# Patient Record
Sex: Female | Born: 1945
Health system: Southern US, Community
[De-identification: ages and names within clinical notes are randomized; demographics above are authoritative.]

## PROBLEM LIST (undated history)

## (undated) DIAGNOSIS — J45909 Unspecified asthma, uncomplicated: Secondary | ICD-10-CM

## (undated) DIAGNOSIS — E119 Type 2 diabetes mellitus without complications: Secondary | ICD-10-CM

## (undated) DIAGNOSIS — J449 Chronic obstructive pulmonary disease, unspecified: Secondary | ICD-10-CM

## (undated) DIAGNOSIS — G8929 Other chronic pain: Secondary | ICD-10-CM

## (undated) DIAGNOSIS — E785 Hyperlipidemia, unspecified: Secondary | ICD-10-CM

## (undated) DIAGNOSIS — J309 Allergic rhinitis, unspecified: Secondary | ICD-10-CM

## (undated) DIAGNOSIS — F329 Major depressive disorder, single episode, unspecified: Secondary | ICD-10-CM

## (undated) DIAGNOSIS — F3289 Other specified depressive episodes: Secondary | ICD-10-CM

## (undated) DIAGNOSIS — I10 Essential (primary) hypertension: Secondary | ICD-10-CM

## (undated) DIAGNOSIS — J189 Pneumonia, unspecified organism: Secondary | ICD-10-CM

## (undated) DIAGNOSIS — L409 Psoriasis, unspecified: Secondary | ICD-10-CM

## (undated) DIAGNOSIS — K219 Gastro-esophageal reflux disease without esophagitis: Secondary | ICD-10-CM

## (undated) DIAGNOSIS — J42 Unspecified chronic bronchitis: Secondary | ICD-10-CM

## (undated) DIAGNOSIS — M1990 Unspecified osteoarthritis, unspecified site: Secondary | ICD-10-CM

## (undated) DIAGNOSIS — F411 Generalized anxiety disorder: Secondary | ICD-10-CM

## (undated) DIAGNOSIS — H269 Unspecified cataract: Secondary | ICD-10-CM

## (undated) DIAGNOSIS — T7840XA Allergy, unspecified, initial encounter: Secondary | ICD-10-CM

## (undated) HISTORY — DX: Generalized anxiety disorder: F41.1

## (undated) HISTORY — DX: Major depressive disorder, single episode, unspecified: F32.9

## (undated) HISTORY — DX: Gastro-esophageal reflux disease without esophagitis: K21.9

## (undated) HISTORY — DX: Essential (primary) hypertension: I10

## (undated) HISTORY — DX: Other specified depressive episodes: F32.89

## (undated) HISTORY — DX: Pneumonia, unspecified organism: J18.9

## (undated) HISTORY — DX: Unspecified cataract: H26.9

## (undated) HISTORY — PX: TRIGGER FINGER RELEASE: SHX641

## (undated) HISTORY — DX: Unspecified osteoarthritis, unspecified site: M19.90

## (undated) HISTORY — DX: Unspecified asthma, uncomplicated: J45.909

## (undated) HISTORY — DX: Unspecified chronic bronchitis: J42

## (undated) HISTORY — DX: Psoriasis, unspecified: L40.9

## (undated) HISTORY — PX: UPPER GASTROINTESTINAL ENDOSCOPY: SHX188

## (undated) HISTORY — DX: Allergic rhinitis, unspecified: J30.9

## (undated) HISTORY — DX: Chronic obstructive pulmonary disease, unspecified: J44.9

## (undated) HISTORY — DX: Allergy, unspecified, initial encounter: T78.40XA

## (undated) HISTORY — DX: Type 2 diabetes mellitus without complications: E11.9

## (undated) HISTORY — DX: Hyperlipidemia, unspecified: E78.5

## (undated) HISTORY — PX: COLONOSCOPY: SHX174

## (undated) HISTORY — PX: CARPAL TUNNEL RELEASE: SHX101

---
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
## 1978-10-28 HISTORY — PX: ABDOMINAL HYSTERECTOMY: SHX81

## 2003-10-29 LAB — HM COLONOSCOPY: HM Colonoscopy: NORMAL

## 2009-07-29 ENCOUNTER — Emergency Department (HOSPITAL_BASED_OUTPATIENT_CLINIC_OR_DEPARTMENT_OTHER): Admission: EM | Admit: 2009-07-29 | Discharge: 2009-07-29 | Payer: Self-pay | Admitting: Emergency Medicine

## 2010-03-02 ENCOUNTER — Ambulatory Visit: Payer: Self-pay | Admitting: Internal Medicine

## 2010-03-02 DIAGNOSIS — E1169 Type 2 diabetes mellitus with other specified complication: Secondary | ICD-10-CM | POA: Insufficient documentation

## 2010-03-02 DIAGNOSIS — E1140 Type 2 diabetes mellitus with diabetic neuropathy, unspecified: Secondary | ICD-10-CM | POA: Insufficient documentation

## 2010-03-02 DIAGNOSIS — R058 Other specified cough: Secondary | ICD-10-CM | POA: Insufficient documentation

## 2010-03-02 DIAGNOSIS — R05 Cough: Secondary | ICD-10-CM

## 2010-03-02 DIAGNOSIS — J310 Chronic rhinitis: Secondary | ICD-10-CM | POA: Insufficient documentation

## 2010-03-02 DIAGNOSIS — F418 Other specified anxiety disorders: Secondary | ICD-10-CM | POA: Insufficient documentation

## 2010-03-02 DIAGNOSIS — I10 Essential (primary) hypertension: Secondary | ICD-10-CM | POA: Insufficient documentation

## 2010-03-02 DIAGNOSIS — K219 Gastro-esophageal reflux disease without esophagitis: Secondary | ICD-10-CM | POA: Insufficient documentation

## 2010-03-02 DIAGNOSIS — E785 Hyperlipidemia, unspecified: Secondary | ICD-10-CM

## 2010-03-02 DIAGNOSIS — IMO0002 Reserved for concepts with insufficient information to code with codable children: Secondary | ICD-10-CM | POA: Insufficient documentation

## 2010-03-02 DIAGNOSIS — E1165 Type 2 diabetes mellitus with hyperglycemia: Secondary | ICD-10-CM | POA: Insufficient documentation

## 2010-03-02 LAB — CONVERTED CEMR LAB
ALT: 38 units/L — ABNORMAL HIGH (ref 0–35)
AST: 41 units/L — ABNORMAL HIGH (ref 0–37)
Albumin: 3.7 g/dL (ref 3.5–5.2)
Alkaline Phosphatase: 54 units/L (ref 39–117)
BUN: 14 mg/dL (ref 6–23)
Basophils Absolute: 0 10*3/uL (ref 0.0–0.1)
Basophils Relative: 0.3 % (ref 0.0–3.0)
Bilirubin, Direct: 0 mg/dL (ref 0.0–0.3)
CO2: 28 meq/L (ref 19–32)
Calcium: 9 mg/dL (ref 8.4–10.5)
Chloride: 105 meq/L (ref 96–112)
Cholesterol: 176 mg/dL (ref 0–200)
Creatinine, Ser: 0.7 mg/dL (ref 0.4–1.2)
Creatinine,U: 172.9 mg/dL
Eosinophils Absolute: 0.2 10*3/uL (ref 0.0–0.7)
Eosinophils Relative: 2.2 % (ref 0.0–5.0)
GFR calc non Af Amer: 109.82 mL/min (ref >60)
Glucose, Bld: 103 mg/dL — ABNORMAL HIGH (ref 70–99)
HCT: 35.3 % — ABNORMAL LOW (ref 36.0–46.0)
HDL: 37.4 mg/dL — ABNORMAL LOW (ref >39.00)
Hemoglobin: 11.9 g/dL — ABNORMAL LOW (ref 12.0–15.0)
Hgb A1c MFr Bld: 6.9 % — ABNORMAL HIGH (ref 4.6–6.5)
LDL Cholesterol: 114 mg/dL — ABNORMAL HIGH (ref 0–99)
Lymphocytes Relative: 30.3 % (ref 12.0–46.0)
Lymphs Abs: 2.4 10*3/uL (ref 0.7–4.0)
MCHC: 33.7 g/dL (ref 30.0–36.0)
MCV: 91.6 fL (ref 78.0–100.0)
Microalb Creat Ratio: 0.4 mg/g (ref 0.0–30.0)
Microalb, Ur: 0.7 mg/dL (ref 0.0–1.9)
Monocytes Absolute: 0.7 10*3/uL (ref 0.1–1.0)
Monocytes Relative: 8.9 % (ref 3.0–12.0)
Neutro Abs: 4.6 10*3/uL (ref 1.4–7.7)
Neutrophils Relative %: 58.3 % (ref 43.0–77.0)
Platelets: 245 10*3/uL (ref 150.0–400.0)
Potassium: 3.8 meq/L (ref 3.5–5.1)
RBC: 3.86 M/uL — ABNORMAL LOW (ref 3.87–5.11)
RDW: 13.5 % (ref 11.5–14.6)
Sodium: 142 meq/L (ref 135–145)
TSH: 2.42 microintl units/mL (ref 0.35–5.50)
Total Bilirubin: 0.4 mg/dL (ref 0.3–1.2)
Total CHOL/HDL Ratio: 5
Total Protein: 7.2 g/dL (ref 6.0–8.3)
Triglycerides: 121 mg/dL (ref 0.0–149.0)
VLDL: 24.2 mg/dL (ref 0.0–40.0)
WBC: 7.9 10*3/uL (ref 4.5–10.5)

## 2010-03-02 LAB — HM DIABETES FOOT EXAM

## 2010-04-17 ENCOUNTER — Telehealth: Payer: Self-pay | Admitting: Internal Medicine

## 2010-04-17 ENCOUNTER — Ambulatory Visit: Payer: Self-pay | Admitting: Internal Medicine

## 2010-07-06 ENCOUNTER — Ambulatory Visit: Payer: Self-pay | Admitting: Internal Medicine

## 2010-07-07 LAB — CONVERTED CEMR LAB: Hgb A1c MFr Bld: 7.2 % — ABNORMAL HIGH (ref 4.6–6.5)

## 2010-07-11 ENCOUNTER — Telehealth: Payer: Self-pay | Admitting: Internal Medicine

## 2010-08-28 ENCOUNTER — Telehealth: Payer: Self-pay | Admitting: Internal Medicine

## 2010-09-14 ENCOUNTER — Encounter: Payer: Self-pay | Admitting: Internal Medicine

## 2010-09-14 LAB — HM MAMMOGRAPHY

## 2010-10-12 ENCOUNTER — Ambulatory Visit: Payer: Self-pay | Admitting: Internal Medicine

## 2010-10-12 DIAGNOSIS — F411 Generalized anxiety disorder: Secondary | ICD-10-CM | POA: Insufficient documentation

## 2010-10-15 LAB — CONVERTED CEMR LAB: Hgb A1c MFr Bld: 6.9 % — ABNORMAL HIGH (ref 4.6–6.5)

## 2010-11-16 ENCOUNTER — Ambulatory Visit
Admission: RE | Admit: 2010-11-16 | Discharge: 2010-11-16 | Payer: Self-pay | Source: Home / Self Care | Attending: Internal Medicine | Admitting: Internal Medicine

## 2010-11-16 DIAGNOSIS — R197 Diarrhea, unspecified: Secondary | ICD-10-CM | POA: Insufficient documentation

## 2010-11-27 NOTE — Assessment & Plan Note (Signed)
Summary: 4 mos f/u // #/cd   Vital Signs:  Patient profile:   65 year old female Height:      60 inches (152.40 cm) Weight:      178.12 pounds (80.96 kg) O2 Sat:      98 % on Room air Temp:     98.5 degrees F (36.94 degrees C) oral Pulse rate:   87 / minute BP sitting:   132 / 68  (left arm) Cuff size:   regular  Vitals Entered By: Orlan Leavens RMA (July 06, 2010 9:14 AM)  O2 Flow:  Room air CC: 4 month follow-up Is Patient Diabetic? Yes Did you bring your meter with you today? No Pain Assessment Patient in pain? no      Comments Req refill on hydromet cough syrup. C/o ongoing cough, light headed. Also want flu shot   Primary Care Provider:  Newt Lukes MD  CC:  4 month follow-up.  History of Present Illness: here for followup -  1) HTN - reports compliance with ongoing medical treatment and no changes in medication dose or frequency. denies adverse side effects related to current therapy.   2) dyslipidemia - not compliance with ongoing prescribed medical treatment due leg fatigue no changes in medication dose or frequency. denies adverse side effects related to current therapy.  3) DM2 - reports compliance with ongoing medical treatment and no changes in medication dose or frequency. denies adverse side effects related to current therapy.  home cbgs 130-140, checks sugars few times each week - no signs or symptoms hypoglycemia  4) chronic bronchitis and allergies - cough is near daily symptoms, dry - no mucus or fever or ST, mild hoarseness from nasal drainage - no smoking hx - no COPD or asthma  Clinical Review Panels:  Immunizations   Last Flu Vaccine:  Fluvax 3+ (07/06/2010)  Lipid Management   Cholesterol:  176 (03/02/2010)   LDL (bad choesterol):  114 (03/02/2010)   HDL (good cholesterol):  37.40 (03/02/2010)  Diabetes Management   HgBA1C:  6.9 (03/02/2010)   Creatinine:  0.7 (03/02/2010)   Last Foot Exam:  yes (03/02/2010)   Last Flu  Vaccine:  Fluvax 3+ (07/06/2010)  CBC   WBC:  7.9 (03/02/2010)   RBC:  3.86 (03/02/2010)   Hgb:  11.9 (03/02/2010)   Hct:  35.3 (03/02/2010)   Platelets:  245.0 (03/02/2010)   MCV  91.6 (03/02/2010)   MCHC  33.7 (03/02/2010)   RDW  13.5 (03/02/2010)   PMN:  58.3 (03/02/2010)   Lymphs:  30.3 (03/02/2010)   Monos:  8.9 (03/02/2010)   Eosinophils:  2.2 (03/02/2010)   Basophil:  0.3 (03/02/2010)  Complete Metabolic Panel   Glucose:  103 (03/02/2010)   Sodium:  142 (03/02/2010)   Potassium:  3.8 (03/02/2010)   Chloride:  105 (03/02/2010)   CO2:  28 (03/02/2010)   BUN:  14 (03/02/2010)   Creatinine:  0.7 (03/02/2010)   Albumin:  3.7 (03/02/2010)   Total Protein:  7.2 (03/02/2010)   Calcium:  9.0 (03/02/2010)   Total Bili:  0.4 (03/02/2010)   Alk Phos:  54 (03/02/2010)   SGPT (ALT):  38 (03/02/2010)   SGOT (AST):  41 (03/02/2010)   Current Medications (verified): 1)  Onetouch Ultra Test  Strp (Glucose Blood) .... Check Once Daily or As Instructed 2)  Fish Oil 1000 Mg Caps (Omega-3 Fatty Acids) .... Take 1 By Mouth Once Daily 3)  Zyrtec Hives Relief 10 Mg Tabs (Cetirizine Hcl) .Marland KitchenMarland KitchenMarland Kitchen  Take 1 By Mouth Once Daily 4)  Klor-Con M20 20 Meq Cr-Tabs (Potassium Chloride Crys Cr) .... Take 1 By Mouth Once Daily 5)  Alprazolam 0.5 Mg Tabs (Alprazolam) .... Take 1 By Mouth Every 8 Hours As Needed For Panic or Anxiety 6)  Losartan Potassium-Hctz 100-25 Mg Tabs (Losartan Potassium-Hctz) .... Take 1 By Mouth Once Daily 7)  Amlodipine Besylate 5 Mg Tabs (Amlodipine Besylate) .... Take 1 By Mouth Once Daily 8)  Metformin Hcl 500 Mg Tabs (Metformin Hcl) .... Take 1 By Mouth Two Times A Day 9)  Hydromet 5-1.5 Mg/53ml Syrp (Hydrocodone-Homatropine) .... Take 1 Teaspoon Q 6 Hours As Needed For Cough 10)  Vitamin D 1000 Unit Tabs (Cholecalciferol) .... Take 1 By Mouth Once Daily 11)  Multivitamins  Tabs (Multiple Vitamin) .... Take 1 By Mouth Once Daily 12)  Proventil Hfa 108 (90 Base) Mcg/act Aers  (Albuterol Sulfate) .... Use As Needed 13)  Tessalon 200 Mg Caps (Benzonatate) .Marland Kitchen.. 1 By Mouth Three Times A Day As Needed For Cough 14)  Fluticasone Propionate 50 Mcg/act Susp (Fluticasone Propionate) .Marland Kitchen.. 1 Spary Each Nostril Two Times A Day  Allergies (verified): 1)  ! Penicillin  Past History:  Past Medical History: Allergic rhinitis Depression GERD Hyperlipidemia   Hypertension diabetes mellitus -type 2  Review of Systems  The patient denies anorexia, fever, weight gain, chest pain, and headaches.    Physical Exam  General:  overweight-appearing.  alert, well-developed, well-nourished, and cooperative to examination.    Lungs:  normal respiratory effort, no intercostal retractions or use of accessory muscles; normal breath sounds bilaterally - no crackles and no wheezes.    Heart:  normal rate, regular rhythm, no murmur, and no rub. BLE without edema.  Psych:  Oriented X3, memory intact for recent and remote, normally interactive, good eye contact, not anxious appearing, not depressed appearing, and not agitated.      Impression & Recommendations:  Problem # 1:  DIABETES MELLITUS, TYPE II (ICD-250.00)  only taking metformin once daily due to GI symptoms - consider alt med if inc in a1c Her updated medication list for this problem includes:    Losartan Potassium-hctz 100-25 Mg Tabs (Losartan potassium-hctz) .Marland Kitchen... Take 1 by mouth once daily    Metformin Hcl 500 Mg Tabs (Metformin hcl) .Marland Kitchen... Take 1 by mouth two times a day  Labs Reviewed: Creat: 0.7 (03/02/2010)    Reviewed HgBA1c results: 6.9 (03/02/2010)  Orders: TLB-A1C / Hgb A1C (Glycohemoglobin) (83036-A1C)  Problem # 2:  HYPERTENSION (ICD-401.9)  Her updated medication list for this problem includes:    Losartan Potassium-hctz 100-25 Mg Tabs (Losartan potassium-hctz) .Marland Kitchen... Take 1 by mouth once daily    Amlodipine Besylate 5 Mg Tabs (Amlodipine besylate) .Marland Kitchen... Take 1 by mouth once daily  BP today:  132/68 Prior BP: 122/72 (04/17/2010)  Labs Reviewed: K+: 3.8 (03/02/2010) Creat: : 0.7 (03/02/2010)   Chol: 176 (03/02/2010)   HDL: 37.40 (03/02/2010)   LDL: 114 (03/02/2010)   TG: 121.0 (03/02/2010)  Problem # 3:  HYPERLIPIDEMIA (ICD-272.4)  reports poor tol of statins - rx'd pravastatin most recently - causes legs to "give out" but no soreness or weakness cont diet effort to control as pt declines med tx  Labs Reviewed: SGOT: 41 (03/02/2010)   SGPT: 38 (03/02/2010)   HDL:37.40 (03/02/2010)  LDL:114 (03/02/2010)  Chol:176 (03/02/2010)  Trig:121.0 (03/02/2010)  Problem # 4:  ALLERGIC RHINITIS (ICD-477.9)  Her updated medication list for this problem includes:    Zyrtec Hives Relief 10  Mg Tabs (Cetirizine hcl) .Marland Kitchen... Take 1 by mouth once daily    Fluticasone Propionate 50 Mcg/act Susp (Fluticasone propionate) .Marland Kitchen... 1 spary each nostril two times a day  Discussed use of allergy medications and environmental measures.   Problem # 5:  BRONCHITIS, CHRONIC (ICD-491.9)  Complete Medication List: 1)  Onetouch Ultra Test Strp (Glucose blood) .... Check once daily or as instructed 2)  Fish Oil 1000 Mg Caps (Omega-3 fatty acids) .... Take 1 by mouth once daily 3)  Zyrtec Hives Relief 10 Mg Tabs (Cetirizine hcl) .... Take 1 by mouth once daily 4)  Klor-con M20 20 Meq Cr-tabs (Potassium chloride crys cr) .... Take 1 by mouth once daily 5)  Alprazolam 0.5 Mg Tabs (Alprazolam) .... Take 1 by mouth every 8 hours as needed for panic or anxiety 6)  Losartan Potassium-hctz 100-25 Mg Tabs (Losartan potassium-hctz) .... Take 1 by mouth once daily 7)  Amlodipine Besylate 5 Mg Tabs (Amlodipine besylate) .... Take 1 by mouth once daily 8)  Metformin Hcl 500 Mg Tabs (Metformin hcl) .... Take 1 by mouth two times a day 9)  Hydromet 5-1.5 Mg/48ml Syrp (Hydrocodone-homatropine) .... Take 1 teaspoon q 6 hours as needed for cough 10)  Vitamin D 1000 Unit Tabs (Cholecalciferol) .... Take 1 by mouth once  daily 11)  Multivitamins Tabs (Multiple vitamin) .... Take 1 by mouth once daily 12)  Proventil Hfa 108 (90 Base) Mcg/act Aers (Albuterol sulfate) .... Use as needed 13)  Tessalon 200 Mg Caps (Benzonatate) .Marland Kitchen.. 1 by mouth three times a day as needed for cough 14)  Fluticasone Propionate 50 Mcg/act Susp (Fluticasone propionate) .Marland Kitchen.. 1 spary each nostril two times a day 15)  Mupirocin 2 % Oint (Mupirocin) .... Apply to affected area three times a day as needed  Other Orders: Flu Vaccine 52yrs + MEDICARE PATIENTS (Z6109) Administration Flu vaccine - MCR (U0454) Flu Vaccine Consent Questions     Do you have a history of severe allergic reactions to this vaccine? no    Any prior history of allergic reactions to egg and/or gelatin? no    Do you have a sensitivity to the preservative Thimersol? no    Do you have a past history of Guillan-Barre Syndrome? no    Do you currently have an acute febrile illness? no    Have you ever had a severe reaction to latex? no    Vaccine information given and explained to patient? yes    Are you currently pregnant? no    Lot Number:[id number]   Exp Date:04/27/2011   Manufacturer: [employer]    Site Given  Left Deltoid IM Other Orders: Flu Vaccine 68yrs + MEDICARE PATIENTS (U9811) Administration Flu vaccine - MCR (B1478)  Patient Instructions: 1)  it was good to see you today.  2)  test(s) ordered today - your results will be posted on the phone tree for review in 48-72 hours from the time of test completion; call [phone] and enter your 9 digit MRN (listed above on this page, just below your name); if any changes need to be made or there are abnormal results, you will be contacted directly.  3)  refill on hydromet given to you to use as needed (esp at night)  4)  Please schedule a follow-up appointment in 3-4 months for diabetes labs, call sooner if problems.  Prescriptions: HYDROMET 5-1.5 MG/5ML SYRP (HYDROCODONE-HOMATROPINE) take 1 teaspoon q 6 hours as  needed for cough  #200cc x 0   Entered and Authorized by:  Newt Lukes MD   Signed by:   Newt Lukes MD on 07/06/2010   Method used:   Print then Give to Patient   RxID:   [id number] MUPIROCIN 2 % OINT (MUPIROCIN) apply to affected area three times a day as needed  #1 x 0   Entered and Authorized by:   Newt Lukes MD   Signed by:   Newt Lukes MD on 07/06/2010   Method used:   Electronically to        [facility] [phone]* (retail)       [address] [place], [place]  [address]       Ph: [phone]       Fax: [phone]   RxID:   [id number]  .medflu

## 2010-11-27 NOTE — Assessment & Plan Note (Signed)
Summary: sinus drainage/cough/cd   Vital Signs:  Patient profile:   65 year old female Height:      60 inches (152.40 cm) Weight:      179.4 pounds (81.55 kg) O2 Sat:      98 % on Room air Temp:     97.9 degrees F (36.61 degrees C) oral Pulse rate:   97 / minute BP sitting:   122 / 72  (left arm) Cuff size:   regular  Vitals Entered By: Orlan Leavens (April 17, 2010 9:31 AM)  O2 Flow:  Room air CC: Cough/ sinus drainage, URI symptoms Is Patient Diabetic? Yes Did you bring your meter with you today? No Pain Assessment Patient in pain? no        Primary Care Provider:  Newt Lukes MD  CC:  Cough/ sinus drainage and URI symptoms.  History of Present Illness:  URI Symptoms      This is a 65 year old woman who presents with URI symptoms.  The symptoms began 4 days ago.  The severity is described as moderate.  The patient reports clear nasal discharge, sore throat, and dry cough, but denies earache and sick contacts.  The patient denies fever, stiff neck, dyspnea, wheezing, rash, and vomiting.  The patient also reports sneezing and seasonal symptoms.  The patient denies response to antihistamine, headache, and muscle aches.  The patient denies the following risk factors for Strep sinusitis: tooth pain and Strep exposure.    Current Medications (verified): 1)  Onetouch Ultra Test  Strp (Glucose Blood) .... Check Once Daily or As Instructed 2)  Nasonex 50 Mcg/act Susp (Mometasone Furoate) .... Use 1 Spray Each Nostril Two Times A Day As Needed 3)  Fish Oil 1000 Mg Caps (Omega-3 Fatty Acids) .... Take 1 By Mouth Once Daily 4)  Zyrtec Hives Relief 10 Mg Tabs (Cetirizine Hcl) .... Take 1 By Mouth Once Daily 5)  Klor-Con M20 20 Meq Cr-Tabs (Potassium Chloride Crys Cr) .... Take 1 By Mouth Once Daily 6)  Alprazolam 0.5 Mg Tabs (Alprazolam) .... Take 1 By Mouth Every 8 Hours As Needed For Panic or Anxiety 7)  Losartan Potassium-Hctz 100-25 Mg Tabs (Losartan Potassium-Hctz) .... Take 1  By Mouth Once Daily 8)  Amlodipine Besylate 5 Mg Tabs (Amlodipine Besylate) .... Take 1 By Mouth Once Daily 9)  Metformin Hcl 500 Mg Tabs (Metformin Hcl) .... Take 1 By Mouth Two Times A Day 10)  Hydromet 5-1.5 Mg/59ml Syrp (Hydrocodone-Homatropine) .... Take 1 Teaspoon Q 6 Hours As Needed For Cough 11)  Vitamin D 1000 Unit Tabs (Cholecalciferol) .... Take 1 By Mouth Once Daily 12)  Multivitamins  Tabs (Multiple Vitamin) .... Take 1 By Mouth Once Daily 13)  Proventil Hfa 108 (90 Base) Mcg/act Aers (Albuterol Sulfate) .... Use As Needed  Allergies (verified): 1)  ! Penicillin  Past History:  Past Medical History: Allergic rhinitis Depression GERD Hyperlipidemia   Hypertension diabetes mellitus -type 2  Review of Systems  The patient denies fever, weight loss, hoarseness, chest pain, headaches, and hemoptysis.    Physical Exam  General:  overweight-appearing.  alert, well-developed, well-nourished, and cooperative to examination.    Ears:  normal pinnae bilaterally, without erythema, swelling, or tenderness to palpation. TMs clear, without effusion, or cerumen impaction. Hearing grossly normal bilaterally  Mouth:  teeth and gums in good repair; mucous membranes moist, without lesions or ulcers. oropharynx clear without exudate, no erythema. +PND, clear Lungs:  normal respiratory effort, no intercostal retractions or  use of accessory muscles; normal breath sounds bilaterally - no crackles and no wheezes.    Heart:  normal rate, regular rhythm, no murmur, and no rub. BLE without edema.    Impression & Recommendations:  Problem # 1:  ALLERGIC RHINITIS (ICD-477.9)  increase antihistamine to two times a day x 7 days, then resume once daily  resume nasal steroid - add tessalon and refill hydromet for cough - hold abx and systemic steroids for now - pt will call if worse symptoms  Her updated medication list for this problem includes:    Nasonex 50 Mcg/act Susp (Mometasone furoate)  ..... Use 1 spray each nostril two times a day as needed    Zyrtec Hives Relief 10 Mg Tabs (Cetirizine hcl) .Marland Kitchen... Take 1 by mouth once daily  Discussed use of allergy medications and environmental measures.   Orders: Prescription Created Electronically [phone])  Complete Medication List: 1)  Onetouch Ultra Test Strp (Glucose blood) .... Check once daily or as instructed 2)  Nasonex 50 Mcg/act Susp (Mometasone furoate) .... Use 1 spray each nostril two times a day as needed 3)  Fish Oil 1000 Mg Caps (Omega-3 fatty acids) .... Take 1 by mouth once daily 4)  Zyrtec Hives Relief 10 Mg Tabs (Cetirizine hcl) .... Take 1 by mouth once daily 5)  Klor-con M20 20 Meq Cr-tabs (Potassium chloride crys cr) .... Take 1 by mouth once daily 6)  Alprazolam 0.5 Mg Tabs (Alprazolam) .... Take 1 by mouth every 8 hours as needed for panic or anxiety 7)  Losartan Potassium-hctz 100-25 Mg Tabs (Losartan potassium-hctz) .... Take 1 by mouth once daily 8)  Amlodipine Besylate 5 Mg Tabs (Amlodipine besylate) .... Take 1 by mouth once daily 9)  Metformin Hcl 500 Mg Tabs (Metformin hcl) .... Take 1 by mouth two times a day 10)  Hydromet 5-1.5 Mg/72ml Syrp (Hydrocodone-homatropine) .... Take 1 teaspoon q 6 hours as needed for cough 11)  Vitamin D 1000 Unit Tabs (Cholecalciferol) .... Take 1 by mouth once daily 12)  Multivitamins Tabs (Multiple vitamin) .... Take 1 by mouth once daily 13)  Proventil Hfa 108 (90 Base) Mcg/act Aers (Albuterol sulfate) .... Use as needed 14)  Tessalon 200 Mg Caps (Benzonatate) .Marland Kitchen.. 1 by mouth three times a day as needed for cough  Patient Instructions: 1)  it was good to see you today.  2)  increase zyrtec to 2x/day x 7 days, then resume once daily  3)  refill on nasonex -  4)  teassalon for cough  these prescriptions have been electronically submitted to your pharmacy. Please take as directed. Contact our office if you believe you're having problems with the medication(s). 5)  refill on  hydromet given to you to use as needed (esp at night)  6)  if you develop worsening symptoms or fever, call us and we can reconsider antibiotics but it does not appear necessary to use any anitbiotic at this time  7)  Please keep follow-up appointment as previously scheduled, sooner if problems.  Prescriptions: HYDROMET 5-1.5 MG/5ML SYRP (HYDROCODONE-HOMATROPINE) take 1 teaspoon q 6 hours as needed for cough  #200cc x 0   Entered and Authorized by:   Newt Lukes MD   Signed by:   Newt Lukes MD on 04/17/2010   Method used:   Print then Give to Patient   RxID:   [id number] TESSALON 200 MG CAPS (BENZONATATE) 1 by mouth three times a day as needed for cough  #40 x 1  Entered and Authorized by:   Newt Lukes MD   Signed by:   Newt Lukes MD on 04/17/2010   Method used:   Electronically to        [facility] [phone]* (retail)       [address] [place], [place]  [address]       Ph: [phone]       Fax: [phone]   RxID:   [id number] NASONEX 50 MCG/ACT SUSP (MOMETASONE FUROATE) use 1 spray each nostril two times a day as needed  #1 x 1   Entered by:   Orlan Leavens   Authorized by:   Newt Lukes MD   Signed by:   Orlan Leavens on 04/17/2010   Method used:   Electronically to        [facility] [phone]* (retail)       [address] [place], [place]  [address]       Ph: [phone]       Fax: [phone]   RxID:   [id number]

## 2010-11-27 NOTE — Progress Notes (Signed)
  Phone Note Call from Patient Call back at Home Phone [phone] Call back at or [phone]   Caller: Patient Call For: Newt Lukes MD Summary of Call: Pt rec'd results of A1C, but  pt believes she had add'l labs drawn. Please advise. Initial call taken by: Verdell Face,  July 11, 2010 10:00 AM  Follow-up for Phone Call        Pt informed A1C only Follow-up by: Margaret Pyle, CMA,  July 11, 2010 10:40 AM

## 2010-11-27 NOTE — Progress Notes (Signed)
Summary: referral  Phone Note Call from Patient Call back at Home Phone [phone]   Caller: Patient Summary of Call: Pt called req referral to have Mammogram done. Initial call taken by: Margaret Pyle, CMA,  August 28, 2010 11:35 AM  Follow-up for Phone Call        order done Follow-up by: Newt Lukes MD,  August 28, 2010 12:20 PM  Additional Follow-up for Phone Call Additional follow up Details #1::        Pt informed via VM Additional Follow-up by: Margaret Pyle, CMA,  August 28, 2010 1:37 PM  New Problems: UNSPECIFIED BREAST SCREENING (ICD-V76.10)   New Problems: UNSPECIFIED BREAST SCREENING (ICD-V76.10)

## 2010-11-27 NOTE — Assessment & Plan Note (Signed)
Summary: NEW MEDICARE PT PER JUNE/3RD FLOOR GI-#--PKG--STC   Vital Signs:  Patient profile:   65 year old female Height:      60 inches (152.40 cm) Weight:      179.0 pounds (81.36 kg) BMI:     35.08 O2 Sat:      98 % on Room air Temp:     98.6 degrees F (37.00 degrees C) oral Pulse rate:   92 / minute BP sitting:   142 / 82  (left arm) Cuff size:   regular  Vitals Entered By: Orlan Leavens (Mar 02, 2010 9:26 AM)  O2 Flow:  Room air CC: New patient Is Patient Diabetic? Yes Did you bring your meter with you today? No Pain Assessment Patient in pain? no        Primary Care Provider:  Newt Lukes MD  CC:  New patient.  History of Present Illness: new pt to me and our practice, here to est care - prev followed at [facility] in [place]  1) HTN - reports compliance with ongoing medical treatment and no changes in medication dose or frequency. denies adverse side effects related to current therapy.   2) dyslipidemia - not compliance with ongoing prescribed medical treatment due leg fatigue no changes in medication dose or frequency. denies adverse side effects related to current therapy.  3) DM2 - reports compliance with ongoing medical treatment and no changes in medication dose or frequency. denies adverse side effects related to current therapy.  home cbgs 130-140, checks sugars few times each week -  4) chronic bornchitis and allergies - cough is near daily symptoms during spring season - no mucus or fever or ST, mild hoarseness from nasal drainage - no smoking hx - no COPD or asthma  Preventive Screening-Counseling & Management  Alcohol-Tobacco     Alcohol drinks/day: 0     Smoking Status: never     Tobacco Counseling: not indicated; no tobacco use  Caffeine-Diet-Exercise     Does Patient Exercise: no     Exercise Counseling: to improve exercise regimen     Depression Counseling: not indicated; screening negative for  depression  Safety-Violence-Falls     Seat Belt Counseling: not indicated; patient wears seat belts     Helmet Counseling: not indicated; patient wears helmet when riding bicycle/motocycle     Firearms in the Home: no firearms in the home     Firearm Counseling: not applicable     Smoke Detectors: yes     Smoke Detector Counseling: n/a     Violence Counseling: not indicated; no violence risk noted     Fall Risk Counseling: not indicated; no significant falls noted  Clinical Review Panels:  Prevention   Last Colonoscopy:  Pt states was done by Dr. Jason Fila @ [facility]. results was normal (10/29/2003)   Current Medications (verified): 1)  Onetouch Ultra Test  Strp (Glucose Blood) .... Check Two Times A Day 2)  Nasonex 50 Mcg/act Susp (Mometasone Furoate) .... Use 1 Spray Each Nostril Two Times A Day As Needed 3)  Fish Oil 1000 Mg Caps (Omega-3 Fatty Acids) .... Take 1 By Mouth Qd 4)  Zyrtec Hives Relief 10 Mg Tabs (Cetirizine Hcl) .... Take 1 By Mouth Qd 5)  Klor-Con M20 20 Meq Cr-Tabs (Potassium Chloride Crys Cr) .... Take 1 By Mouth Qd 6)  Alprazolam 0.5 Mg Tabs (Alprazolam) .... Take 1 Q 8 Hours As Needed 7)  Losartan Potassium-Hctz 100-25 Mg  Tabs (Losartan Potassium-Hctz) .... Take 1 By Mouth Once Daily 8)  Amlodipine Besylate 5 Mg Tabs (Amlodipine Besylate) .... Take 1 By Mouth Once Daily 9)  Metformin Hcl 500 Mg Tabs (Metformin Hcl) .... Take 1 By Mouth Two Times A Day 10)  Hydromet 5-1.5 Mg/3ml Syrp (Hydrocodone-Homatropine) .... Take 1 Teaspoon Q 6 Hours As Needed For Cough 11)  Vitamin D 1000 Unit Tabs (Cholecalciferol) .... Take 1 By Mouth Once Daily 12)  Multivitamins  Tabs (Multiple Vitamin) .... Take 1 By Mouth Once Daily  Allergies (verified): 1)  ! Penicillin  Past History:  Past medical, surgical, family and social histories (including risk factors) reviewed, and no changes noted (except as noted below).  Past Medical  History: Allergic rhinitis Depression GERD Hyperlipidemia  Hypertension diabetes mellitus -type 2  Past Surgical History: Hysterectomy (1980) index finger surg for trigger finger  Family History: Reviewed history and no changes required. Family History Diabetes 1st degree relative (parent) Family History High cholesterol (parent) Family History Hypertension (parent) Stroke (parent)  mom expired age 48 - HTN, CVA dad expired age 64  - DM complications  Social History: Reviewed history and no changes required. Never Smoked married, lives with spouse retired from [employer] insurance Smoking Status:  never Does Patient Exercise:  no  Review of Systems       see HPI above. I have reviewed all other systems and they were negative.   Physical Exam  General:  overweight-appearing.  alert, well-developed, well-nourished, and cooperative to examination.    Eyes:  vision grossly intact; pupils equal, round and reactive to light.  conjunctiva and lids normal.    Ears:  normal pinnae bilaterally, without erythema, swelling, or tenderness to palpation. TMs clear, without effusion, or cerumen impaction. Hearing grossly normal bilaterally  Mouth:  teeth and gums in good repair; mucous membranes moist, without lesions or ulcers. oropharynx clear without exudate, no erythema. +PND, clear Lungs:  normal respiratory effort, no intercostal retractions or use of accessory muscles; normal breath sounds bilaterally - no crackles and no wheezes.    Heart:  normal rate, regular rhythm, no murmur, and no rub. BLE without edema. normal DP pulses and normal cap refill in all 4 extremities    Abdomen:  soft, non-tender, normal bowel sounds, no distention; no masses and no appreciable hepatomegaly or splenomegaly.   Msk:  No deformity or scoliosis noted of thoracic or lumbar spine.   Neurologic:  alert & oriented X3 and cranial nerves II-XII symetrically intact.  strength normal in all extremities, sensation  intact to light touch, and gait normal. speech fluent without dysarthria or aphasia; follows commands with good comprehension.  Skin:  no rashes, vesicles, ulcers, or erythema. No nodules or irregularity to palpation.  Psych:  Oriented X3, memory intact for recent and remote, normally interactive, good eye contact, not anxious appearing, not depressed appearing, and not agitated.     Diabetes Management Exam:    Foot Exam (with socks and/or shoes not present):       Sensory-Pinprick/Light touch:          Left medial foot (L-4): normal          Left dorsal foot (L-5): normal          Left lateral foot (S-1): normal          Right medial foot (L-4): normal          Right dorsal foot (L-5): normal          Right  lateral foot (S-1): normal       Sensory-Monofilament:          Left foot: normal          Right foot: normal       Inspection:          Left foot: abnormal             Comments: small callous on lateral side of 5th MTP          Right foot: normal       Nails:          Left foot: normal          Right foot: normal   Impression & Recommendations:  Problem # 1:  DIABETES MELLITUS, TYPE II (ICD-250.00)  Her updated medication list for this problem includes:    Losartan Potassium-hctz 100-25 Mg Tabs (Losartan potassium-hctz) .Marland Kitchen... Take 1 by mouth once daily    Metformin Hcl 500 Mg Tabs (Metformin hcl) .Marland Kitchen... Take 1 by mouth two times a day  Orders: TLB-Microalbumin/Creat Ratio, Urine (82043-MALB) TLB-A1C / Hgb A1C (Glycohemoglobin) (83036-A1C)  Problem # 2:  HYPERTENSION (ICD-401.9)  Her updated medication list for this problem includes:    Losartan Potassium-hctz 100-25 Mg Tabs (Losartan potassium-hctz) .Marland Kitchen... Take 1 by mouth once daily    Amlodipine Besylate 5 Mg Tabs (Amlodipine besylate) .Marland Kitchen... Take 1 by mouth once daily  Orders: TLB-BMP (Basic Metabolic Panel-BMET) (80048-METABOL)  BP today: 142/82  Problem # 3:  HYPERLIPIDEMIA (ICD-272.4) reports poor tol of  statins - rx'd pravastatin most recently - causes legs to "give out" but no soreness or weakness get prior labs and check now Orders: TLB-Lipid Panel (80061-LIPID)  Problem # 4:  GERD (ICD-530.81)  Orders: TLB-CBC Platelet - w/Differential (85025-CBCD)  Problem # 5:  DEPRESSION (ICD-311) occ panic attacks - <1/week - cont low dose bz as needed  Her updated medication list for this problem includes:    Alprazolam 0.5 Mg Tabs (Alprazolam) .Marland Kitchen... Take 1 by mouth every 8 hours as needed for panic or anxiety  Orders: TLB-TSH (Thyroid Stimulating Hormone) (84443-TSH)  Complete Medication List: 1)  Onetouch Ultra Test Strp (Glucose blood) .... Check once daily or as instructed 2)  Nasonex 50 Mcg/act Susp (Mometasone furoate) .... Use 1 spray each nostril two times a day as needed 3)  Fish Oil 1000 Mg Caps (Omega-3 fatty acids) .... Take 1 by mouth once daily 4)  Zyrtec Hives Relief 10 Mg Tabs (Cetirizine hcl) .... Take 1 by mouth once daily 5)  Klor-con M20 20 Meq Cr-tabs (Potassium chloride crys cr) .... Take 1 by mouth once daily 6)  Alprazolam 0.5 Mg Tabs (Alprazolam) .... Take 1 by mouth every 8 hours as needed for panic or anxiety 7)  Losartan Potassium-hctz 100-25 Mg Tabs (Losartan potassium-hctz) .... Take 1 by mouth once daily 8)  Amlodipine Besylate 5 Mg Tabs (Amlodipine besylate) .... Take 1 by mouth once daily 9)  Metformin Hcl 500 Mg Tabs (Metformin hcl) .... Take 1 by mouth two times a day 10)  Hydromet 5-1.5 Mg/8ml Syrp (Hydrocodone-homatropine) .... Take 1 teaspoon q 6 hours as needed for cough 11)  Vitamin D 1000 Unit Tabs (Cholecalciferol) .... Take 1 by mouth once daily 12)  Multivitamins Tabs (Multiple vitamin) .... Take 1 by mouth once daily  Other Orders: TLB-Hepatic/Liver Function Pnl (80076-HEPATIC)  Patient Instructions: 1)  it was good to see you today.  2)  will send for records from [facility] in [place] to  review 3)  test(s) ordered today - your results will  be posted on the phone tree for review in 48-72 hours from the time of test completion; call [phone] and enter your 9 digit MRN (listed above on this page, just below your name); if any changes need to be made or there are abnormal results, you will be contacted directly.  4)  refills on medications will be send after labs reviewed (or new medications if changes need to be made) 5)  Please schedule a follow-up appointment in 3-4 months, sooner if problems.  Prescriptions: ALPRAZOLAM 0.5 MG TABS (ALPRAZOLAM) TAKE 1 by mouth every 8 hours as needed for panic or anxiety  #30 x 5   Entered and Authorized by:   Newt Lukes MD   Signed by:   Newt Lukes MD on 03/02/2010   Method used:   Print then Give to Patient   RxID:   [id number]    Colonoscopy  Procedure date:  10/29/2003  Findings:      Pt states was done by Dr. Jason Fila @ [facility] in [facility]. results was normal    Immunization History:  Influenza Immunization History:    Influenza:  historical (07/28/2009)

## 2010-11-27 NOTE — Progress Notes (Signed)
Summary: ALT med  Phone Note From Pharmacy   Caller: [address]  [place] [phone] Summary of Call: Pharmacist called stating Rx for Nasonex is too expensive for pt. Pt is requesting ALT med: Pharmasicts recommends generic Flonase. Okay to change? Initial call taken by: Margaret Pyle, CMA,  April 17, 2010 10:38 AM  Follow-up for Phone Call        yes - ok Follow-up by: Newt Lukes MD,  April 17, 2010 1:08 PM    New/Updated Medications: FLUTICASONE PROPIONATE 50 MCG/ACT SUSP (FLUTICASONE PROPIONATE) 1 spary each nostril two times a day Prescriptions: FLUTICASONE PROPIONATE 50 MCG/ACT SUSP (FLUTICASONE PROPIONATE) 1 spary each nostril two times a day  #1 x 1   Entered by:   Margaret Pyle, CMA   Authorized by:   Newt Lukes MD   Signed by:   Margaret Pyle, CMA on 04/17/2010   Method used:   Electronically to        [facility] [phone]* (retail)       [address] [place], [place]  [address]       Ph: [phone]       Fax: [phone]   RxID:   [id number]

## 2010-11-29 NOTE — Assessment & Plan Note (Signed)
Summary: DIARRHEA/ NAUSEA / NWS   Vital Signs:  Patient profile:   65 year old female Height:      60 inches (152.40 cm) Weight:      175.8 pounds (79.91 kg) O2 Sat:      98 % on Room air Temp:     98.5 degrees F (36.94 degrees C) oral Pulse rate:   90 / minute BP sitting:   146 / 78  (left arm) Cuff size:   regular  Vitals Entered By: Orlan Leavens RMA (November 16, 2010 1:16 PM)  O2 Flow:  Room air CC: Diarrhea/Nausea since sunday Is Patient Diabetic? Yes Did you bring your meter with you today? No Pain Assessment Patient in pain? no        Primary Care Provider:  Newt Lukes MD  CC:  Diarrhea/Nausea since sunday.  History of Present Illness:  Diarrhea      This is a 65 year old woman who presents with Diarrhea.  The symptoms began 5 days ago.  The severity is described as moderate.  The patient reports >6 stools per day and watery/unformed stools, but denies voluminous stools, blood in stool, and mucus in stool.  Associated symptoms include fever and nausea.  The patient denies abdominal pain, abdominal cramps, vomiting, lightheadedness, increased thirst, and weight loss.  The symptoms are worse with any food.  The symptoms are better with fasting.  Patient denies risk factors for diarrhea including immunocompromise, laxative use, recent antibiotic use, and recent hospitalization +sick contacts with g-son who has similar symptoms.  Patient has no history of irritable bowel syndrome, diverticulitis, or bowel resection.  No medication changes or new medications, no travel.  also reviewed chronic med issues: HTN - reports compliance with ongoing medical treatment and no changes in medication dose or frequency. denies adverse side effects related to current therapy.   dyslipidemia - not compliant with ongoing prescribed medical treatment due leg fatigue no changes in medication dose or frequency. denies adverse side effects related to current therapy.  DM2 - reports  compliance with ongoing medical treatment and no changes in medication dose or frequency. denies adverse side effects related to current therapy.  home cbgs 130-140, checks sugars few times each week - no signs or symptoms hypoglycemia  chronic bronchitis and allergies - cough is near daily symptoms, dry - no mucus or fever or ST, mild hoarseness from nasal drainage - no smoking hx - no COPD or asthma  increase GERD symptoms - taking omep 20mg  once daily with some improvement  Clinical Review Panels:  Lipid Management   Cholesterol:  176 (03/02/2010)   LDL (bad choesterol):  114 (03/02/2010)   HDL (good cholesterol):  37.40 (03/02/2010)  Diabetes Management   HgBA1C:  6.9 (10/12/2010)   Creatinine:  0.7 (03/02/2010)   Last Foot Exam:  yes (03/02/2010)   Last Flu Vaccine:  Fluvax 3+ (07/06/2010)  CBC   WBC:  7.9 (03/02/2010)   RBC:  3.86 (03/02/2010)   Hgb:  11.9 (03/02/2010)   Hct:  35.3 (03/02/2010)   Platelets:  245.0 (03/02/2010)   MCV  91.6 (03/02/2010)   MCHC  33.7 (03/02/2010)   RDW  13.5 (03/02/2010)   PMN:  58.3 (03/02/2010)   Lymphs:  30.3 (03/02/2010)   Monos:  8.9 (03/02/2010)   Eosinophils:  2.2 (03/02/2010)   Basophil:  0.3 (03/02/2010)  Complete Metabolic Panel   Glucose:  103 (03/02/2010)   Sodium:  142 (03/02/2010)   Potassium:  3.8 (03/02/2010)  Chloride:  105 (03/02/2010)   CO2:  28 (03/02/2010)   BUN:  14 (03/02/2010)   Creatinine:  0.7 (03/02/2010)   Albumin:  3.7 (03/02/2010)   Total Protein:  7.2 (03/02/2010)   Calcium:  9.0 (03/02/2010)   Total Bili:  0.4 (03/02/2010)   Alk Phos:  54 (03/02/2010)   SGPT (ALT):  38 (03/02/2010)   SGOT (AST):  41 (03/02/2010)   Current Medications (verified): 1)  Onetouch Ultra Test  Strp (Glucose Blood) .... Check Once Daily or As Instructed 2)  Fish Oil 1000 Mg Caps (Omega-3 Fatty Acids) .... Take 1 By Mouth Once Daily 3)  Zyrtec Hives Relief 10 Mg Tabs (Cetirizine Hcl) .... Take 1 By Mouth Once Daily 4)   Klor-Con M20 20 Meq Cr-Tabs (Potassium Chloride Crys Cr) .... Take 1 By Mouth Once Daily 5)  Alprazolam 0.5 Mg Tabs (Alprazolam) .... Take 1 By Mouth Every 8 Hours As Needed For Panic or Anxiety 6)  Losartan Potassium-Hctz 100-25 Mg Tabs (Losartan Potassium-Hctz) .... Take 1 By Mouth Once Daily 7)  Amlodipine Besylate 5 Mg Tabs (Amlodipine Besylate) .... Take 1 By Mouth Once Daily 8)  Metformin Hcl 500 Mg Tabs (Metformin Hcl) .... Take 1 By Mouth Two Times A Day 9)  Vitamin D 1000 Unit Tabs (Cholecalciferol) .... Take 1 By Mouth Once Daily 10)  Multivitamins  Tabs (Multiple Vitamin) .... Take 1 By Mouth Once Daily 11)  Proventil Hfa 108 (90 Base) Mcg/act Aers (Albuterol Sulfate) .... Use As Needed 12)  Tessalon 200 Mg Caps (Benzonatate) .Marland Kitchen.. 1 By Mouth Three Times A Day As Needed For Cough 13)  Fluticasone Propionate 50 Mcg/act Susp (Fluticasone Propionate) .Marland Kitchen.. 1 Spary Each Nostril Two Times A Day 14)  Mupirocin 2 % Oint (Mupirocin) .... Apply To Affected Area Three Times A Day As Needed 15)  Omeprazole 40 Mg Cpdr (Omeprazole) .Marland Kitchen.. 1 By Mouth Once Daily 16)  Sertraline Hcl 25 Mg Tabs (Sertraline Hcl) .Marland Kitchen.. 1 By Mouth Once Daily  Allergies (verified): 1)  ! Penicillin  Past History:  Past Medical History: Allergic rhinitis Depression  GERD Hyperlipidemia   Hypertension diabetes mellitus - type 2     Review of Systems  The patient denies weight loss, chest pain, syncope, peripheral edema, and headaches.    Physical Exam  General:  overweight-appearing.  alert, well-developed, well-nourished, and cooperative to examination.    Lungs:  normal respiratory effort, no intercostal retractions or use of accessory muscles; normal breath sounds bilaterally - no crackles and no wheezes.    Heart:  normal rate, regular rhythm, no murmur, and no rub. BLE without edema.  Abdomen:  soft, non-tender, normal bowel sounds, no distention; no masses and no appreciable hepatomegaly or splenomegaly.       Impression & Recommendations:  Problem # 1:  DIARRHEA (ICD-787.91)  +sick contacts - fever and nausea but no pain, no blood - exam benign - HD stable and afeb today but symptoms present >5d tx for bact overgrowth with flagyl and zofran for assoc nausea -erx done immodium otc rec and supportive care  Her updated medication list for this problem includes:    Imodium A-d 2 Mg Tabs (Loperamide hcl) .Marland Kitchen... 1-2 by mouth every 6 hurs as needed for diarhhea (max 8 tab in 24hours)  Discussed symptom control and diet. Call if worsening of symptoms or signs of dehydration.   Orders: Prescription Created Electronically [id number]  Complete Medication List: 1)  Onetouch Ultra Test Strp (Glucose blood) .... Check once daily  or as instructed 2)  Fish Oil 1000 Mg Caps (Omega-3 fatty acids) .... Take 1 by mouth once daily 3)  Zyrtec Hives Relief 10 Mg Tabs (Cetirizine hcl) .... Take 1 by mouth once daily 4)  Klor-con M20 20 Meq Cr-tabs (Potassium chloride crys cr) .... Take 1 by mouth once daily 5)  Alprazolam 0.5 Mg Tabs (Alprazolam) .... Take 1 by mouth every 8 hours as needed for panic or anxiety 6)  Losartan Potassium-hctz 100-25 Mg Tabs (Losartan potassium-hctz) .... Take 1 by mouth once daily 7)  Amlodipine Besylate 5 Mg Tabs (Amlodipine besylate) .... Take 1 by mouth once daily 8)  Metformin Hcl 500 Mg Tabs (Metformin hcl) .... Take 1 by mouth two times a day 9)  Vitamin D 1000 Unit Tabs (Cholecalciferol) .... Take 1 by mouth once daily 10)  Multivitamins Tabs (Multiple vitamin) .... Take 1 by mouth once daily 11)  Proventil Hfa 108 (90 Base) Mcg/act Aers (Albuterol sulfate) .... Use as needed 12)  Tessalon 200 Mg Caps (Benzonatate) .Marland Kitchen.. 1 by mouth three times a day as needed for cough 13)  Fluticasone Propionate 50 Mcg/act Susp (Fluticasone propionate) .Marland Kitchen.. 1 spary each nostril two times a day 14)  Mupirocin 2 % Oint (Mupirocin) .... Apply to affected area three times a day as needed 15)   Omeprazole 40 Mg Cpdr (Omeprazole) .Marland Kitchen.. 1 by mouth once daily 16)  Sertraline Hcl 25 Mg Tabs (Sertraline hcl) .Marland Kitchen.. 1 by mouth once daily 17)  Metronidazole 250 Mg Tabs (Metronidazole) .Marland Kitchen.. 1 by mouth four times a day x 7 days 18)  Zofran 4 Mg Tabs (Ondansetron hcl) .Marland Kitchen.. 1 by mouth every 6 hours as needed for nausea/vomitting symptoms 19)  Imodium A-d 2 Mg Tabs (Loperamide hcl) .Marland Kitchen.. 1-2 by mouth every 6 hurs as needed for diarhhea (max 8 tab in 24hours)  Patient Instructions: 1)  it was good to see you today. 2)  metronidazole antibioitcs and zofran for nausea/vomitting - your prescriptions have been electronically submitted to your pharmacy. Please take as directed. Contact our office if you believe you're having problems with the medication(s).  3)  use immodium for diarrhea 4)  Get plenty of rest, drink lots of clear liquids, and use Tylenol or Ibuprofen for comfort. Return in 7-10 days if you're not better:sooner if you're feeling worse. Prescriptions: ZOFRAN 4 MG TABS (ONDANSETRON HCL) 1 by mouth every 6 hours as needed for nausea/vomitting symptoms  #20 x 0   Entered and Authorized by:   Newt Lukes MD   Signed by:   Newt Lukes MD on 11/16/2010   Method used:   Electronically to        [facility] [phone]* (retail)       [address] [place], [place]  [address]       Ph: [phone]       Fax: [phone]   RxID:   [id number] METRONIDAZOLE 250 MG TABS (METRONIDAZOLE) 1 by mouth four times a day x 7 days  #28 x 0   Entered and Authorized by:   Newt Lukes MD   Signed by:   Newt Lukes MD on 11/16/2010   Method used:   Electronically to        [facility] [phone]* (retail)       [address] [place], [place]  [address]  Ph: [phone]       Fax: [phone]   RxID:   [id number]    Orders Added: 1)  Est. Patient Level IV [10272] 2)  Prescription Created Electronically [phone]

## 2010-11-29 NOTE — Assessment & Plan Note (Signed)
Summary: 3-4 mth fu---stc   Vital Signs:  Patient profile:   65 year old female Height:      60 inches (152.40 cm) Weight:      177.8 pounds (80.82 kg) O2 Sat:      98 % on Room air Temp:     98.4 degrees F (36.89 degrees C) oral Pulse rate:   94 / minute BP sitting:   102 / 62  (left arm) Cuff size:   regular  Vitals Entered By: Orlan Leavens RMA (October 12, 2010 10:44 AM)  O2 Flow:  Room air CC: 3-4 month follow-up Is Patient Diabetic? Yes Did you bring your meter with you today? No Pain Assessment Patient in pain? no      Comments Pt want to know when is her next colonoscopy due   Primary Care Provider:  Newt Lukes MD  CC:  3-4 month follow-up.  History of Present Illness: here for followup -  1) HTN - reports compliance with ongoing medical treatment and no changes in medication dose or frequency. denies adverse side effects related to current therapy.   2) dyslipidemia - not compliance with ongoing prescribed medical treatment due leg fatigue no changes in medication dose or frequency. denies adverse side effects related to current therapy.  3) DM2 - reports compliance with ongoing medical treatment and no changes in medication dose or frequency. denies adverse side effects related to current therapy.  home cbgs 130-140, checks sugars few times each week - no signs or symptoms hypoglycemia  4) chronic bronchitis and allergies - cough is near daily symptoms, dry - no mucus or fever or ST, mild hoarseness from nasal drainage - no smoking hx - no COPD or asthma  increase GERD symptoms - taking omep 20mg  once daily with some improvement  inc anxiey symptoms - ?anything to use during day  Clinical Review Panels:  Prevention   Last Mammogram:  done @ [facility] No specific mammographic evidence of malignancy.  Assessment: BIRADS 1. (09/14/2010)   Last Colonoscopy:  Pt states was done by Dr. Jason Fila [facility].  results was normal (10/29/2003)  Lipid Management   Cholesterol:  176 (03/02/2010)   LDL (bad choesterol):  114 (03/02/2010)   HDL (good cholesterol):  37.40 (03/02/2010)  Diabetes Management   HgBA1C:  7.2 (07/06/2010)   Creatinine:  0.7 (03/02/2010)   Last Foot Exam:  yes (03/02/2010)   Last Flu Vaccine:  Fluvax 3+ (07/06/2010)  CBC   WBC:  7.9 (03/02/2010)   RBC:  3.86 (03/02/2010)   Hgb:  11.9 (03/02/2010)   Hct:  35.3 (03/02/2010)   Platelets:  245.0 (03/02/2010)   MCV  91.6 (03/02/2010)   MCHC  33.7 (03/02/2010)   RDW  13.5 (03/02/2010)   PMN:  58.3 (03/02/2010)   Lymphs:  30.3 (03/02/2010)   Monos:  8.9 (03/02/2010)   Eosinophils:  2.2 (03/02/2010)   Basophil:  0.3 (03/02/2010)  Complete Metabolic Panel   Glucose:  103 (03/02/2010)   Sodium:  142 (03/02/2010)   Potassium:  3.8 (03/02/2010)   Chloride:  105 (03/02/2010)   CO2:  28 (03/02/2010)   BUN:  14 (03/02/2010)   Creatinine:  0.7 (03/02/2010)   Albumin:  3.7 (03/02/2010)   Total Protein:  7.2 (03/02/2010)   Calcium:  9.0 (03/02/2010)   Total Bili:  0.4 (03/02/2010)   Alk Phos:  54 (03/02/2010)   SGPT (ALT):  38 (03/02/2010)   SGOT (AST):  41 (  03/02/2010)   Current Medications (verified): 1)  Onetouch Ultra Test  Strp (Glucose Blood) .... Check Once Daily or As Instructed 2)  Fish Oil 1000 Mg Caps (Omega-3 Fatty Acids) .... Take 1 By Mouth Once Daily 3)  Zyrtec Hives Relief 10 Mg Tabs (Cetirizine Hcl) .... Take 1 By Mouth Once Daily 4)  Klor-Con M20 20 Meq Cr-Tabs (Potassium Chloride Crys Cr) .... Take 1 By Mouth Once Daily 5)  Alprazolam 0.5 Mg Tabs (Alprazolam) .... Take 1 By Mouth Every 8 Hours As Needed For Panic or Anxiety 6)  Losartan Potassium-Hctz 100-25 Mg Tabs (Losartan Potassium-Hctz) .... Take 1 By Mouth Once Daily 7)  Amlodipine Besylate 5 Mg Tabs (Amlodipine Besylate) .... Take 1 By Mouth Once Daily 8)  Metformin Hcl 500 Mg Tabs (Metformin Hcl) .... Take 1 By Mouth Two Times A Day 9)   Hydromet 5-1.5 Mg/46ml Syrp (Hydrocodone-Homatropine) .... Take 1 Teaspoon Q 6 Hours As Needed For Cough 10)  Vitamin D 1000 Unit Tabs (Cholecalciferol) .... Take 1 By Mouth Once Daily 11)  Multivitamins  Tabs (Multiple Vitamin) .... Take 1 By Mouth Once Daily 12)  Proventil Hfa 108 (90 Base) Mcg/act Aers (Albuterol Sulfate) .... Use As Needed 13)  Tessalon 200 Mg Caps (Benzonatate) .Marland Kitchen.. 1 By Mouth Three Times A Day As Needed For Cough 14)  Fluticasone Propionate 50 Mcg/act Susp (Fluticasone Propionate) .Marland Kitchen.. 1 Spary Each Nostril Two Times A Day 15)  Mupirocin 2 % Oint (Mupirocin) .... Apply To Affected Area Three Times A Day As Needed  Allergies (verified): 1)  ! Penicillin  Past History:  Past Medical History: Allergic rhinitis Depression  GERD Hyperlipidemia   Hypertension diabetes mellitus - type 2  Review of Systems  The patient denies fever, weight loss, weight gain, chest pain, and headaches.         increase anxiety symptoms with holidays  Physical Exam  General:  overweight-appearing.  alert, well-developed, well-nourished, and cooperative to examination.    Lungs:  normal respiratory effort, no intercostal retractions or use of accessory muscles; normal breath sounds bilaterally - no crackles and no wheezes.    Heart:  normal rate, regular rhythm, no murmur, and no rub. BLE without edema.  Psych:  Oriented X3, memory intact for recent and remote, normally interactive, good eye contact, midly anxious appearing, not depressed appearing, and not agitated.      Impression & Recommendations:  Problem # 1:  DIABETES MELLITUS, TYPE II (ICD-250.00)  Her updated medication list for this problem includes:    Losartan Potassium-hctz 100-25 Mg Tabs (Losartan potassium-hctz) .Marland Kitchen... Take 1 by mouth once daily    Metformin Hcl 500 Mg Tabs (Metformin hcl) .Marland Kitchen... Take 1 by mouth two times a day  only taking metformin once daily due to GI symptoms - consider alt med if inc in  a1c  Labs Reviewed: Creat: 0.7 (03/02/2010)    Reviewed HgBA1c results: 7.2 (07/06/2010)  6.9 (03/02/2010)  Orders: TLB-A1C / Hgb A1C (Glycohemoglobin) (83036-A1C)  Problem # 2:  GERD (ICD-530.81)  improved but not resolved with otc 20mg  omepr inc dose - erx done prev on nexium with good results (remote) Her updated medication list for this problem includes:    Omeprazole 40 Mg Cpdr (Omeprazole) .Marland Kitchen... 1 by mouth once daily  Labs Reviewed: Hgb: 11.9 (03/02/2010)   Hct: 35.3 (03/02/2010)  Orders: Prescription Created Electronically [phone])  Problem # 3:  ANXIETY STATE, UNSPECIFIED (ICD-300.00)  inc symptoms with holidays - cont as needed bz but add daily  ssri - risk/benefit explained and pt understands/agrees erx sertraline done - to call if prob, f/u 6-12 weeks Her updated medication list for this problem includes:    Alprazolam 0.5 Mg Tabs (Alprazolam) .Marland Kitchen... Take 1 by mouth every 8 hours as needed for panic or anxiety    Sertraline Hcl 25 Mg Tabs (Sertraline hcl) .Marland Kitchen... 1 by mouth once daily  Orders: Prescription Created Electronically [phone])  Complete Medication List: 1)  Onetouch Ultra Test Strp (Glucose blood) .... Check once daily or as instructed 2)  Fish Oil 1000 Mg Caps (Omega-3 fatty acids) .... Take 1 by mouth once daily 3)  Zyrtec Hives Relief 10 Mg Tabs (Cetirizine hcl) .... Take 1 by mouth once daily 4)  Klor-con M20 20 Meq Cr-tabs (Potassium chloride crys cr) .... Take 1 by mouth once daily 5)  Alprazolam 0.5 Mg Tabs (Alprazolam) .... Take 1 by mouth every 8 hours as needed for panic or anxiety 6)  Losartan Potassium-hctz 100-25 Mg Tabs (Losartan potassium-hctz) .... Take 1 by mouth once daily 7)  Amlodipine Besylate 5 Mg Tabs (Amlodipine besylate) .... Take 1 by mouth once daily 8)  Metformin Hcl 500 Mg Tabs (Metformin hcl) .... Take 1 by mouth two times a day 9)  Vitamin D 1000 Unit Tabs (Cholecalciferol) .... Take 1 by mouth once daily 10)  Multivitamins  Tabs (Multiple vitamin) .... Take 1 by mouth once daily 11)  Proventil Hfa 108 (90 Base) Mcg/act Aers (Albuterol sulfate) .... Use as needed 12)  Tessalon 200 Mg Caps (Benzonatate) .Marland Kitchen.. 1 by mouth three times a day as needed for cough 13)  Fluticasone Propionate 50 Mcg/act Susp (Fluticasone propionate) .Marland Kitchen.. 1 spary each nostril two times a day 14)  Mupirocin 2 % Oint (Mupirocin) .... Apply to affected area three times a day as needed 15)  Omeprazole 40 Mg Cpdr (Omeprazole) .Marland Kitchen.. 1 by mouth once daily 16)  Sertraline Hcl 25 Mg Tabs (Sertraline hcl) .Marland Kitchen.. 1 by mouth once daily  Patient Instructions: 1)  it was good to see you today.  2)  test(s) ordered today - your results will be posted on the phone tree for review in 48-72 hours from the time of test completion; call [phone] and enter your 9 digit MRN (listed above on this page, just below your name); if any changes need to be made or there are abnormal results, you will be contacted directly. 3)  start sertraline for anxiety - also omeprazole 40 daily for reflux -  4)  your prescriptions have been electronically submitted to your pharmacy. Please take as directed. Contact our office if you believe you're having problems with the medication(s).  5)  Please schedule a follow-up appointment in 3 months for review of anxiety and diabetes, call sooner if problems.  Prescriptions: SERTRALINE HCL 25 MG TABS (SERTRALINE HCL) 1 by mouth once daily  #30 x 3   Entered and Authorized by:   Newt Lukes MD   Signed by:   Newt Lukes MD on 10/12/2010   Method used:   Electronically to        [facility] [phone]* (retail)       [address] [place], [place]  [address]       Ph: [phone]       Fax: [phone]   RxID:   [id number] OMEPRAZOLE 40 MG CPDR (OMEPRAZOLE) 1 by mouth once daily  #30 x 3   Entered and  Authorized by:   Newt Lukes MD   Signed by:   Newt Lukes MD on 10/12/2010   Method used:    Electronically to        [facility] [phone]* (retail)       [address] [place], [place]  [address]       Ph: [phone]       Fax: [phone]   RxID:   [id number]    Orders Added: 1)  TLB-A1C / Hgb A1C (Glycohemoglobin) [83036-A1C] 2)  Est. Patient Level IV [24401] 3)  Prescription Created Electronically [phone]

## 2011-01-02 ENCOUNTER — Encounter: Payer: Self-pay | Admitting: Internal Medicine

## 2011-01-02 ENCOUNTER — Ambulatory Visit (INDEPENDENT_AMBULATORY_CARE_PROVIDER_SITE_OTHER): Payer: Medicare Other | Admitting: Internal Medicine

## 2011-01-02 DIAGNOSIS — J029 Acute pharyngitis, unspecified: Secondary | ICD-10-CM | POA: Insufficient documentation

## 2011-01-02 DIAGNOSIS — E119 Type 2 diabetes mellitus without complications: Secondary | ICD-10-CM

## 2011-01-08 NOTE — Assessment & Plan Note (Signed)
Summary: CONGESTION / NWS   Vital Signs:  Patient profile:   65 year old female Height:      60 inches (152.40 cm) Weight:      177 pounds (80.45 kg) BMI:     34.69 O2 Sat:      96 % on Room air Temp:     99.8 degrees F (37.67 degrees C) oral Pulse rate:   109 / minute BP sitting:   128 / 78  (left arm) Cuff size:   regular  Vitals Entered By: Brenton Grills CMA (AAMA) (January 02, 2011 2:03 PM)  O2 Flow:  Room air CC: Pt c/o congestion, cough, sore throat x 6 days/aj, URI symptoms Is Patient Diabetic? Yes Comments Pt has never started Sertraline   Primary Care Provider:  Newt Lukes MD  CC:  Pt c/o congestion, cough, sore throat x 6 days/aj, and URI symptoms.  History of Present Illness:  URI Symptoms      This is a 65 year old woman who presents with URI symptoms.  The symptoms began 5 days ago.  The severity is described as moderate.  not releived with otc meds.  The patient reports nasal congestion, sore throat, productive cough, and sick contacts, but denies purulent nasal discharge and earache.  Associated symptoms include low-grade fever (<100.5 degrees) and use of an antipyretic.  The patient denies stiff neck, dyspnea, wheezing, rash, vomiting, and diarrhea.  The patient also reports headache, muscle aches, and severe fatigue.  The patient denies sneezing and seasonal symptoms.  Risk factors for Strep sinusitis include tender adenopathy.  The patient denies the following risk factors for Strep sinusitis: Strep exposure and absence of cough.    Clinical Review Panels:  Diabetes Management   HgBA1C:  6.9 (10/12/2010)   Creatinine:  0.7 (03/02/2010)   Last Foot Exam:  yes (03/02/2010)   Last Flu Vaccine:  Fluvax 3+ (07/06/2010)  CBC   WBC:  7.9 (03/02/2010)   RBC:  3.86 (03/02/2010)   Hgb:  11.9 (03/02/2010)   Hct:  35.3 (03/02/2010)   Platelets:  245.0 (03/02/2010)   MCV  91.6 (03/02/2010)   MCHC  33.7 (03/02/2010)   RDW  13.5 (03/02/2010)   PMN:  58.3  (03/02/2010)   Lymphs:  30.3 (03/02/2010)   Monos:  8.9 (03/02/2010)   Eosinophils:  2.2 (03/02/2010)   Basophil:  0.3 (03/02/2010)  Complete Metabolic Panel   Glucose:  103 (03/02/2010)   Sodium:  142 (03/02/2010)   Potassium:  3.8 (03/02/2010)   Chloride:  105 (03/02/2010)   CO2:  28 (03/02/2010)   BUN:  14 (03/02/2010)   Creatinine:  0.7 (03/02/2010)   Albumin:  3.7 (03/02/2010)   Total Protein:  7.2 (03/02/2010)   Calcium:  9.0 (03/02/2010)   Total Bili:  0.4 (03/02/2010)   Alk Phos:  54 (03/02/2010)   SGPT (ALT):  38 (03/02/2010)   SGOT (AST):  41 (03/02/2010)   Current Medications (verified): 1)  Onetouch Ultra Test  Strp (Glucose Blood) .... Check Once Daily or As Instructed 2)  Fish Oil 1000 Mg Caps (Omega-3 Fatty Acids) .... Take 1 By Mouth Once Daily 3)  Zyrtec Hives Relief 10 Mg Tabs (Cetirizine Hcl) .... Take 1 By Mouth Once Daily 4)  Klor-Con M20 20 Meq Cr-Tabs (Potassium Chloride Crys Cr) .... Take 1 By Mouth Once Daily 5)  Alprazolam 0.5 Mg Tabs (Alprazolam) .... Take 1 By Mouth Every 8 Hours As Needed For Panic or Anxiety 6)  Losartan  Potassium-Hctz 100-25 Mg Tabs (Losartan Potassium-Hctz) .... Take 1 By Mouth Once Daily 7)  Amlodipine Besylate 5 Mg Tabs (Amlodipine Besylate) .... Take 1 By Mouth Once Daily 8)  Metformin Hcl 500 Mg Tabs (Metformin Hcl) .... Take 1 By Mouth Two Times A Day 9)  Vitamin D 1000 Unit Tabs (Cholecalciferol) .... Take 1 By Mouth Once Daily 10)  Multivitamins  Tabs (Multiple Vitamin) .... Take 1 By Mouth Once Daily 11)  Proventil Hfa 108 (90 Base) Mcg/act Aers (Albuterol Sulfate) .... Use As Needed 12)  Tessalon 200 Mg Caps (Benzonatate) .Marland Kitchen.. 1 By Mouth Three Times A Day As Needed For Cough 13)  Fluticasone Propionate 50 Mcg/act Susp (Fluticasone Propionate) .Marland Kitchen.. 1 Spary Each Nostril Two Times A Day 14)  Mupirocin 2 % Oint (Mupirocin) .... Apply To Affected Area Three Times A Day As Needed 15)  Omeprazole 40 Mg Cpdr (Omeprazole) .Marland Kitchen.. 1  By Mouth Once Daily 16)  Sertraline Hcl 25 Mg Tabs (Sertraline Hcl) .Marland Kitchen.. 1 By Mouth Once Daily 17)  Zofran 4 Mg Tabs (Ondansetron Hcl) .Marland Kitchen.. 1 By Mouth Every 6 Hours As Needed For Nausea/vomitting Symptoms 18)  Imodium A-D 2 Mg Tabs (Loperamide Hcl) .Marland Kitchen.. 1-2 By Mouth Every 6 Hurs As Needed For Diarhhea (Max 8 Tab in 24hours)  Allergies (verified): 1)  ! Penicillin  Past History:  Past Medical History: Allergic rhinitis  Depression  GERD  Hyperlipidemia    Hypertension diabetes mellitus - type 2     Review of Systems       The patient complains of hoarseness.  The patient denies vision loss, syncope, hemoptysis, and abdominal pain.    Physical Exam  General:  overweight-appearing.  alert, well-developed, well-nourished, and cooperative to examination.   hoarse, mildly ill Eyes:  vision grossly intact; pupils equal, round and reactive to light.  conjunctiva and lids normal.    Ears:  normal pinnae bilaterally, without erythema, swelling, or tenderness to palpation. TMs clear, without effusion, or cerumen impaction. Hearing grossly normal bilaterally  Mouth:  teeth and gums in good repair; mucous membranes moist, without lesions or ulcers. oropharynx clear without exudate, mod erythema and vesicle appearance on left perioton region. +PND, clear Neck:  L>R shoddy LAD Lungs:  normal respiratory effort, no intercostal retractions or use of accessory muscles; normal breath sounds bilaterally - no crackles and no wheezes.    Heart:  normal rate, regular rhythm, no murmur, and no rub. BLE without edema.    Impression & Recommendations:  Problem # 1:  ACUTE PHARYNGITIS (ICD-462)  Her updated medication list for this problem includes:    Doxycycline Hyclate 100 Mg Tabs (Doxycycline hyclate) .Marland Kitchen... 1 by mouth two times a day x 7 days  Instructed to complete antibiotics and call if not improved in 48 hours.   Orders: Prescription Created Electronically ([phone])  Problem # 2:  DIABETES  MELLITUS, TYPE II (ICD-250.00)  Her updated medication list for this problem includes:    Losartan Potassium-hctz 100-25 Mg Tabs (Losartan potassium-hctz) .Marland Kitchen... Take 1 by mouth once daily    Metformin Hcl 500 Mg Tabs (Metformin hcl) .Marland Kitchen... Take 1 by mouth two times a day  only taking metformin once daily due to GI symptoms - consider alt med if inc in a1c  Labs Reviewed: Creat: 0.7 (03/02/2010)    Reviewed HgBA1c results: 6.9 (10/12/2010)  7.2 (07/06/2010)  Complete Medication List: 1)  Onetouch Ultra Test Strp (Glucose blood) .... Check once daily or as instructed 2)  Fish Oil 1000  Mg Caps (Omega-3 fatty acids) .... Take 1 by mouth once daily 3)  Zyrtec Hives Relief 10 Mg Tabs (Cetirizine hcl) .... Take 1 by mouth once daily 4)  Klor-con M20 20 Meq Cr-tabs (Potassium chloride crys cr) .... Take 1 by mouth once daily 5)  Alprazolam 0.5 Mg Tabs (Alprazolam) .... Take 1 by mouth every 8 hours as needed for panic or anxiety 6)  Losartan Potassium-hctz 100-25 Mg Tabs (Losartan potassium-hctz) .... Take 1 by mouth once daily 7)  Amlodipine Besylate 5 Mg Tabs (Amlodipine besylate) .... Take 1 by mouth once daily 8)  Metformin Hcl 500 Mg Tabs (Metformin hcl) .... Take 1 by mouth two times a day 9)  Vitamin D 1000 Unit Tabs (Cholecalciferol) .... Take 1 by mouth once daily 10)  Multivitamins Tabs (Multiple vitamin) .... Take 1 by mouth once daily 11)  Proventil Hfa 108 (90 Base) Mcg/act Aers (Albuterol sulfate) .... Use as needed 12)  Tessalon 200 Mg Caps (Benzonatate) .Marland Kitchen.. 1 by mouth three times a day as needed for cough 13)  Fluticasone Propionate 50 Mcg/act Susp (Fluticasone propionate) .Marland Kitchen.. 1 spary each nostril two times a day 14)  Mupirocin 2 % Oint (Mupirocin) .... Apply to affected area three times a day as needed 15)  Omeprazole 40 Mg Cpdr (Omeprazole) .Marland Kitchen.. 1 by mouth once daily 16)  Sertraline Hcl 25 Mg Tabs (Sertraline hcl) .Marland Kitchen.. 1 by mouth once daily 17)  Zofran 4 Mg Tabs (Ondansetron  hcl) .Marland Kitchen.. 1 by mouth every 6 hours as needed for nausea/vomitting symptoms 18)  Imodium A-d 2 Mg Tabs (Loperamide hcl) .Marland Kitchen.. 1-2 by mouth every 6 hurs as needed for diarhhea (max 8 tab in 24hours) 19)  Doxycycline Hyclate 100 Mg Tabs (Doxycycline hyclate) .Marland Kitchen.. 1 by mouth two times a day x 7 days  Patient Instructions: 1)  it was good to see you today. 2)  Doxycycline antibiotics - your prescription has been electronically submitted to your pharmacy. Please take as directed. Contact our office if you believe you're having problems with the medication(s).  3)  continue hydromet for cough as needed  4)  Get plenty of rest, drink lots of clear liquids, and use Tylenol or Ibuprofen for fever and comfort. Return in 7-10 days if you're not better:sooner if you're feeling worse. 5)  Please reschedule follow-up appointment for diabetes check in 2-3 months, sooner if problems.  Prescriptions: DOXYCYCLINE HYCLATE 100 MG TABS (DOXYCYCLINE HYCLATE) 1 by mouth two times a day x 7 days  #14 x 0   Entered and Authorized by:   Newt Lukes MD   Signed by:   Newt Lukes MD on 01/02/2011   Method used:   Electronically to        [facility] [phone]* (retail)       [address] [place], [place]  [address]       Ph: [phone]       Fax: [phone]   RxID:   [id number]    Orders Added: 1)  Est. Patient Level IV [03474] 2)  Prescription Created Electronically [phone]

## 2011-01-09 ENCOUNTER — Ambulatory Visit: Payer: Self-pay | Admitting: Internal Medicine

## 2011-01-31 LAB — BASIC METABOLIC PANEL
BUN: 17 mg/dL (ref 6–23)
CO2: 29 mEq/L (ref 19–32)
Calcium: 10.2 mg/dL (ref 8.4–10.5)
Chloride: 104 mEq/L (ref 96–112)
Creatinine, Ser: 0.8 mg/dL (ref 0.4–1.2)
GFR calc Af Amer: >60 mL/min (ref >60)
GFR calc non Af Amer: >60 mL/min (ref >60)
Glucose, Bld: 79 mg/dL (ref 70–99)
Potassium: 3.4 mEq/L — ABNORMAL LOW (ref 3.5–5.1)
Sodium: 145 mEq/L (ref 135–145)

## 2011-01-31 LAB — DIFFERENTIAL
Basophils Absolute: 0 10*3/uL (ref 0.0–0.1)
Basophils Relative: 0 % (ref 0–1)
Eosinophils Absolute: 0.1 10*3/uL (ref 0.0–0.7)
Eosinophils Relative: 1 % (ref 0–5)
Lymphocytes Relative: 28 % (ref 12–46)
Lymphs Abs: 2.4 10*3/uL (ref 0.7–4.0)
Monocytes Absolute: 0.7 10*3/uL (ref 0.1–1.0)
Monocytes Relative: 8 % (ref 3–12)
Neutro Abs: 5.2 10*3/uL (ref 1.7–7.7)
Neutrophils Relative %: 63 % (ref 43–77)

## 2011-01-31 LAB — CBC
HCT: 37.2 % (ref 36.0–46.0)
Hemoglobin: 12.8 g/dL (ref 12.0–15.0)
MCHC: 34.2 g/dL (ref 30.0–36.0)
MCV: 90.6 fL (ref 78.0–100.0)
Platelets: 230 10*3/uL (ref 150–400)
RBC: 4.11 MIL/uL (ref 3.87–5.11)
RDW: 12.6 % (ref 11.5–15.5)
WBC: 8.4 10*3/uL (ref 4.0–10.5)

## 2011-03-08 ENCOUNTER — Encounter: Payer: Self-pay | Admitting: Internal Medicine

## 2011-03-13 ENCOUNTER — Ambulatory Visit (INDEPENDENT_AMBULATORY_CARE_PROVIDER_SITE_OTHER): Payer: Medicare Other | Admitting: Internal Medicine

## 2011-03-13 ENCOUNTER — Encounter: Payer: Self-pay | Admitting: Internal Medicine

## 2011-03-13 ENCOUNTER — Other Ambulatory Visit (INDEPENDENT_AMBULATORY_CARE_PROVIDER_SITE_OTHER): Payer: PRIVATE HEALTH INSURANCE

## 2011-03-13 DIAGNOSIS — E785 Hyperlipidemia, unspecified: Secondary | ICD-10-CM

## 2011-03-13 DIAGNOSIS — M79642 Pain in left hand: Secondary | ICD-10-CM

## 2011-03-13 DIAGNOSIS — E119 Type 2 diabetes mellitus without complications: Secondary | ICD-10-CM

## 2011-03-13 DIAGNOSIS — I10 Essential (primary) hypertension: Secondary | ICD-10-CM

## 2011-03-13 DIAGNOSIS — M79609 Pain in unspecified limb: Secondary | ICD-10-CM

## 2011-03-13 LAB — LIPID PANEL
Cholesterol: 155 mg/dL (ref 0–200)
HDL: 32.8 mg/dL — ABNORMAL LOW (ref >39.00)
LDL Cholesterol: 89 mg/dL (ref 0–99)
Total CHOL/HDL Ratio: 5
Triglycerides: 164 mg/dL — ABNORMAL HIGH (ref 0.0–149.0)
VLDL: 32.8 mg/dL (ref 0.0–40.0)

## 2011-03-13 LAB — HEMOGLOBIN A1C: Hgb A1c MFr Bld: 7.1 % — ABNORMAL HIGH (ref 4.6–6.5)

## 2011-03-13 LAB — CREATININE, SERUM: Creatinine, Ser: 0.9 mg/dL (ref 0.4–1.2)

## 2011-03-13 MED ORDER — GLUCOSE BLOOD VI STRP: ORAL_STRIP | Status: DC

## 2011-03-13 MED ORDER — MELOXICAM 15 MG PO TABS: 15.0000 mg | ORAL_TABLET | Freq: Every day | ORAL | Status: DC

## 2011-03-13 MED ORDER — ONETOUCH ULTRASOFT LANCETS MISC: Status: DC

## 2011-03-13 MED ORDER — AMLODIPINE BESYLATE 10 MG PO TABS: 10.0000 mg | ORAL_TABLET | Freq: Every day | ORAL | Status: DC

## 2011-03-13 NOTE — Assessment & Plan Note (Signed)
On metformin - home cbgs fait Check a1c and cr now Lab Results  Component Value Date   HGBA1C 6.9* 10/12/2010   Lab Results  Component Value Date   CREATININE 0.7 03/02/2010

## 2011-03-13 NOTE — Patient Instructions (Signed)
It was good to see you today. Test(s) ordered today. Your results will be called to you after review (48-72hours after test completion). If any changes need to be made, you will be notified at that time. Increase amlodipine to 10mg  for blood pressure and add meloxicam for hand arthritis; also new glucometer and supplies - Your prescription(s) have been submitted to your pharmacy. Please take as directed and contact our office if you believe you are having problem(s) with the medication(s). Work on lifestyle changes as discussed (low fat, low carb, increased protein diet; improved exercise efforts; weight loss) to control sugar, blood pressure and cholesterol levels and/or reduce risk of developing other medical problems. Look into [email or web address] or other type of food journal to assist you in this process. Please schedule followup in 3-4 months, call sooner if problems.

## 2011-03-13 NOTE — Assessment & Plan Note (Signed)
Noncompliant with prev rx'd statins including simva and prava -  Recheck lipids now and reconsider nonstatin tx if needed, esp given co-dz with dm, htn

## 2011-03-13 NOTE — Assessment & Plan Note (Signed)
suboptimal control - increase amlodipine dose - cont max ARB/hctz Work on lifestyle control and weight loss BP Readings from Last 3 Encounters:  03/13/11 142/72  01/02/11 128/78  11/16/10 146/78

## 2011-03-13 NOTE — Progress Notes (Signed)
Subjective:    Patient ID: Heidi Franklin, female    DOB: Dec 01, 1945, 65 y.o.   MRN: [id number]  HPI  Here for follow up - reviewed chronic med issues today:  HTN - reports compliance with ongoing medical treatment and no changes in medication dose or frequency. denies adverse side effects related to current therapy.   dyslipidemia - not compliant with ongoing prescribed medical treatment due leg fatigue. no rx'd changes in medication dose or frequency.  DM2 - reports compliance with ongoing medical treatment and no changes in medication dose or frequency. denies adverse side effects related to current therapy.  home cbgs 130-140, checks sugars few times each week - no signs or symptoms hypoglycemia  chronic bronchitis and allergies - cough remians daily symptom: dry - no mucus or fever or ST, mild hoarseness from nasal drainage - no smoking hx - no COPD or asthma  GERD symptoms - taking omep 20mg  once daily with improvement - the patient reports compliance with medication(s) as prescribed. Denies adverse side effects.  Continued anxiey symptoms - tajes BZ prn but prev requested to use during day - has not yet started sertraline (rx 11/2010)  Also complains of left hand pain - onset 3 mo ago - intermittent but unresolved swelling- occassional associated with swelling - no weakness, no precipitating injury, trauma or overuse, no other joint swelling or soft tissue pains  Past Medical History  Diagnosis Date  . BRONCHITIS, CHRONIC   . ALLERGIC RHINITIS   . Anxiety state, unspecified   . DEPRESSION   . DIABETES MELLITUS, TYPE II   . GERD   . HYPERLIPIDEMIA   . HYPERTENSION      Review of Systems  Constitutional: Negative for fever.  Respiratory: Negative for shortness of breath and wheezing.   Cardiovascular: Negative for chest pain.  Musculoskeletal: Negative for back pain.       Objective:   Physical Exam BP 142/72  Pulse 93  Temp(Src) 98 F (36.7 C) (Oral)  Resp 14  Wt  177 lb (80.287 kg)  SpO2 97% Physical Exam  Constitutional: She is oriented to person, place, and time. She appears well-developed and well-nourished. No distress.  Eyes: Conjunctivae and EOM are normal. Pupils are equal, round, and reactive to light. No scleral icterus.  Neck: Normal range of motion. Neck supple. No JVD present. No thyromegaly present.  Cardiovascular: Normal rate, regular rhythm and normal heart sounds.  No murmur heard. No BLE edema. Pulmonary/Chest: Effort normal and breath sounds normal. No respiratory distress. She has no wheezes.  Musculoskeletal: Normal range of motion, no joint effusions. No gross deformities. L hand with soft tissue swelling between 2/3 MCP space - ligamentous fx intact but mildly painful 2nd digit extension - nontender to palp Psychiatric: She has a normal mood and affect. Her behavior is normal. Judgment and thought content normal.   Lab Results  Component Value Date   WBC 7.9 03/02/2010   HGB 11.9* 03/02/2010   HCT 35.3* 03/02/2010   PLT 245.0 03/02/2010   CHOL 176 03/02/2010   TRIG 121.0 03/02/2010   HDL 37.40* 03/02/2010   ALT 38* 03/02/2010   AST 41* 03/02/2010   NA 142 03/02/2010   K 3.8 03/02/2010   CL 105 03/02/2010   CREATININE 0.7 03/02/2010   BUN 14 03/02/2010   CO2 28 03/02/2010   TSH 2.42 03/02/2010   HGBA1C 6.9* 10/12/2010   MICROALBUR 0.7 03/02/2010        Assessment & Plan:  See problem  list. Medications and labs reviewed today. Left hand pain - suspect tendonitis from over use - rest, meloxicam antiinflam - education and reassurance provided - to follow up if worse or unimproved with conservative care - pt understands and agrees

## 2011-04-22 ENCOUNTER — Encounter: Payer: Self-pay | Admitting: Internal Medicine

## 2011-04-22 ENCOUNTER — Ambulatory Visit (INDEPENDENT_AMBULATORY_CARE_PROVIDER_SITE_OTHER): Payer: Medicare Other | Admitting: Internal Medicine

## 2011-04-22 VITALS — BP 120/62 | HR 92 | Temp 97.3°F | Ht 60.0 in | Wt 180.4 lb

## 2011-04-22 DIAGNOSIS — M65839 Other synovitis and tenosynovitis, unspecified forearm: Secondary | ICD-10-CM

## 2011-04-22 DIAGNOSIS — M659 Synovitis and tenosynovitis, unspecified: Secondary | ICD-10-CM

## 2011-04-22 MED ORDER — NAPROXEN 500 MG PO TABS: 500.0000 mg | ORAL_TABLET | Freq: Two times a day (BID) | ORAL | Status: DC

## 2011-04-22 NOTE — Patient Instructions (Signed)
It was good to see you today. Use "double band-aid splint" on your left ring finger as demonstrated today to rest your finger at bedtime x 2 weeks, then as needed Naprosyn 2x/day with food for inflammation - Your prescription(s) have been submitted to your pharmacy. Please take as directed and contact our office if you believe you are having problem(s) with the medication(s). If continued pain or problems, call for injection as we discussed

## 2011-04-22 NOTE — Progress Notes (Signed)
  Subjective:    Patient ID: Heidi Franklin, female    DOB: May 29, 1946, 65 y.o.   MRN: [id number]  HPI  complains of sticking and popping of left ring finger Onset 4 days ago Denies precipitating trauma or recent injury No pain or swelling but feels sore after unlocking pop Hx same on right side - tx remotely with injection  Past Medical History  Diagnosis Date  . BRONCHITIS, CHRONIC   . ALLERGIC RHINITIS   . Anxiety state, unspecified   . DEPRESSION   . DIABETES MELLITUS, TYPE II   . GERD   . HYPERLIPIDEMIA   . HYPERTENSION      Review of Systems  Constitutional: Negative for fever.  Musculoskeletal: Negative for joint swelling and arthralgias.       Objective:   Physical Exam BP 120/62  Pulse 92  Temp(Src) 97.3 F (36.3 C) (Oral)  Ht 5' (1.524 m)  Wt 180 lb 6.4 oz (81.829 kg)  BMI 35.23 kg/m2  SpO2 98% Physical Exam  Constitutional: She is oriented to person, place, and time. She appears well-developed and well-nourished. No distress.  Cardiovascular: Normal rate, regular rhythm and normal heart sounds.  No murmur heard. No BLE edema. Pulmonary/Chest: Effort normal and breath sounds normal. No respiratory distress. She has no wheezes.  Musculoskeletal: L ring finger with tenosynovitis - popping on flexor surface at PIP. Normal range of motion, no joint effusions. No gross deformities Skin: Skin is warm and dry. No rash noted. No erythema.  Psychiatric: She has a normal mood and affect. Her behavior is normal. Judgment and thought content normal.   Lab Results  Component Value Date   WBC 7.9 03/02/2010   HGB 11.9* 03/02/2010   HCT 35.3* 03/02/2010   PLT 245.0 03/02/2010   CHOL 155 03/13/2011   TRIG 164.0* 03/13/2011   HDL 32.80* 03/13/2011   ALT 38* 03/02/2010   AST 41* 03/02/2010   NA 142 03/02/2010   K 3.8 03/02/2010   CL 105 03/02/2010   CREATININE 0.9 03/13/2011   BUN 14 03/02/2010   CO2 28 03/02/2010   TSH 2.42 03/02/2010   HGBA1C 7.1* 03/13/2011   MICROALBUR 0.7 03/02/2010         Assessment & Plan:  Tenosynovitis, left 4th finger - hx same on right side - acute onset <1 week - will tx with rest qhs and splint and NSAIDs x 2 weeks - if continued problems, can reconsider steroid injection but would like to avoid if possible given DM unless fails conserv tx - education on dx provided

## 2011-05-11 ENCOUNTER — Encounter: Payer: Self-pay | Admitting: Internal Medicine

## 2011-05-11 ENCOUNTER — Ambulatory Visit (INDEPENDENT_AMBULATORY_CARE_PROVIDER_SITE_OTHER): Payer: Medicare Other | Admitting: Internal Medicine

## 2011-05-11 VITALS — BP 132/82 | HR 80 | Temp 97.5°F | Wt 175.1 lb

## 2011-05-11 DIAGNOSIS — R0789 Other chest pain: Secondary | ICD-10-CM | POA: Insufficient documentation

## 2011-05-11 DIAGNOSIS — E119 Type 2 diabetes mellitus without complications: Secondary | ICD-10-CM

## 2011-05-11 DIAGNOSIS — R9431 Abnormal electrocardiogram [ECG] [EKG]: Secondary | ICD-10-CM

## 2011-05-11 DIAGNOSIS — R079 Chest pain, unspecified: Secondary | ICD-10-CM

## 2011-05-11 DIAGNOSIS — K219 Gastro-esophageal reflux disease without esophagitis: Secondary | ICD-10-CM

## 2011-05-11 NOTE — Progress Notes (Signed)
  Subjective:    Patient ID: Heidi Franklin, female    DOB: Mar 07, 1946, 65 y.o.   MRN: [id number]  HPI Comes in to Saturday clinic for acute problem . Onset yesterday   Of sharp transient recurring  Mid  To parasternal chest pain when lifting clothes out of car  ( is  Moving)     .  Repeated motion and   Again recurred .    No CP injury fall  Cough sob or pleuritis.  Had barbeque prev  ? If  Pain from reflux   And took prilosec bid  and mylanta . However still has the pain and it is position and sharp.  Hurt when rise up and has to roll out of bed.  No nvd and no spob or cough .  Ok when not moving  Had hx of same years before years ago and   Hospital doctor to [facility].  And felt to be reflux.  No fever nv   Took naproxyn ocass  For finger issues but not recently  No hx of heart disease is diabetic.  Non smoker  Past history family history social history reviewed in the electronic medical record.  Review of Systems ROS:  GEN/ HEENTNo fever, significant weight changes sweats headaches vision problems hearing changes, CV/ PULM; No shortness of breath cough, syncope,edema  change in exercise tolerance. GI /GU: No adominal pain, vomiting, change in bowel habits.. No significant GU symptoms. SKIN/HEME: ,no acute skin rashes suspicious lesions or bleeding. No lymphadenopathy, nodules, masses.  NEURO/ PSYCH:  No neurologic signs such as weakness         Objective:   Physical Exam wdwn AA  in nad    Uncomfortable when arising from table but other wise ok  Neck no masses  Noted supple  HEENT; Atraumatic  Grossly normal Chest:  Clear to A&P without wheezes rales or rhonchi CV:  S1-S2 no gallops or murmurs peripheral perfusion is normal no rubs noted  Palpation  Shows some tenderness lower sternum  and cc junction on left.  Abdomen:  Sof,t normal bowel sounds without hepatosplenomegaly, no guarding rebound or masses no CVA tenderness Normal cap refill  No bruising bleeding Neuro  no gross motor  deficits      EKG  SR  Nonspecific t wave flattening through out. No acute findings but no prev ekg to compare.  No st elevation Assessment & Plan:  Acute left parasternal  localized   chest pain  positional acts like cw costochondral  pain Has gerd   Ok to inc to bid temporarily. EKG has t wave abnormality but no comparison and no evidence of pericarditis. Is diabetic without known heart disease but some anxiety hx.  Limit acitivity until better and follow up with pcp  This week  . Disc findings with patient and to see emergent care for alarm features .

## 2011-05-11 NOTE — Patient Instructions (Addendum)
This acts like a strain of your cartilage rib joints . Will take time and rest to resolve in the next few weeks. Avoid lifting or bending a lot.  Could be aggravated by reflux and take prilosec 2 x a day for now. Get appt with Dr L  This week    To follow up.   You EKG shows some non specific  Abnormalities that could be old but no ekg to  Compare this to   If you get a lot worse then call of seek emergent care.   Tylenol ok for pain

## 2011-05-13 ENCOUNTER — Ambulatory Visit (INDEPENDENT_AMBULATORY_CARE_PROVIDER_SITE_OTHER): Payer: Medicare Other | Admitting: Internal Medicine

## 2011-05-13 ENCOUNTER — Encounter: Payer: Self-pay | Admitting: Internal Medicine

## 2011-05-13 DIAGNOSIS — R079 Chest pain, unspecified: Secondary | ICD-10-CM

## 2011-05-13 MED ORDER — ASPIRIN 81 MG PO CHEW: 81.0000 mg | CHEWABLE_TABLET | Freq: Every day | ORAL | Status: DC

## 2011-05-13 NOTE — Patient Instructions (Signed)
It was good to see you today. Start baby aspirin daily until stress test completed to look for heart problems that may be causing your chest pain we'll make referral for cardiac stress test . Our office will contact you regarding appointment(s) once made. Continue medications for reflux and tylenol as discussed - go to ER if chest pain worse

## 2011-05-13 NOTE — Progress Notes (Signed)
  Subjective:    Heidi Franklin is a 65 y.o. female who presents for evaluation of chest pain. Onset was 3 days ago. Symptoms have resolved since that time. The patient describes the pain as pressure, sharp and does not radiate. Patient rates pain as a 7/10 in intensity during episode, 0/10 pain at this time. Associated symptoms are: none. Aggravating factors are: housework and moving boxes. Alleviating factors are: none despite use of NSAIDS and tylenol. Patient's cardiac risk factors are: diabetes mellitus, dyslipidemia and hypertension. Patient's risk factors for DVT/PE: none. Previous cardiac testing: remote.  The following portions of the patient's history were reviewed and updated as appropriate: allergies, current medications, past family history, past medical history, past social history, past surgical history and problem list.  Review of Systems Respiratory: negative for dyspnea on exertion and pleurisy/chest pain Cardiovascular: negative for irregular heart beat Gastrointestinal: positive for reflux symptoms    Objective:    BP 120/80  Pulse 87  Temp(Src) 97.8 F (36.6 C) (Oral)  Ht 5' (1.524 m)  SpO2 97% General appearance: alert, cooperative and no distress Neck: no adenopathy, no carotid bruit, no JVD, supple, symmetrical, trachea midline and thyroid not enlarged, symmetric, no tenderness/mass/nodules Lungs: clear to auscultation bilaterally Heart: regular rate and rhythm, S1, S2 normal, no murmur, click, rub or gallop Abdomen: soft, non-tender; bowel sounds normal; no masses,  no organomegaly Mskel: tender to palpation over lower sternum  Cardiographics ECG: 05/11/11 EKG reviewed - not repeated today as no recurrent chest pains  Imaging Chest x-ray: not indicated    Assessment:    Chest pain, suspected etiology: chest wall pain    Plan:    Patient history and exam consistent with non-cardiac cause of chest pain. Giver CRF of DM, HTN, chol, start ASA 81qd pending  stress test (refer for same now)

## 2011-05-31 ENCOUNTER — Ambulatory Visit (INDEPENDENT_AMBULATORY_CARE_PROVIDER_SITE_OTHER): Payer: Medicare Other | Admitting: Internal Medicine

## 2011-05-31 ENCOUNTER — Encounter: Payer: Self-pay | Admitting: Internal Medicine

## 2011-05-31 VITALS — BP 120/72 | HR 103 | Temp 98.7°F | Ht 62.0 in

## 2011-05-31 DIAGNOSIS — M659 Synovitis and tenosynovitis, unspecified: Secondary | ICD-10-CM

## 2011-05-31 DIAGNOSIS — M65839 Other synovitis and tenosynovitis, unspecified forearm: Secondary | ICD-10-CM

## 2011-05-31 NOTE — Progress Notes (Signed)
  Subjective:    Patient ID: Heidi Franklin, female    DOB: November 27, 1945, 65 y.o.   MRN: [id number]  HPI  Here follow up trigger finger complains of continued locking and popping of left ring finger Onset mid June 2012 - seen at that time for same Denies precipitating trauma or recent injury coservative tx advised - NSAIDs and night splint - unsuccessful increasing pain, popping and locking - sore after unlocking  Hx same on right side - tx remotely with injection  Past Medical History  Diagnosis Date  . BRONCHITIS, CHRONIC   . ALLERGIC RHINITIS   . Anxiety state, unspecified   . DEPRESSION   . DIABETES MELLITUS, TYPE II   . GERD   . HYPERLIPIDEMIA   . HYPERTENSION      Review of Systems  Constitutional: Negative for fever.  Musculoskeletal: Negative for joint swelling and arthralgias.       Objective:   Physical Exam  BP 120/72  Pulse 103  Temp(Src) 98.7 F (37.1 C) (Oral)  Ht 5\' 2"  (1.575 m)  SpO2 98%  Constitutional: She is oriented to person, place, and time. She appears well-developed and well-nourished. No distress.  Cardiovascular: Normal rate, regular rhythm and normal heart sounds.  No murmur heard. No BLE edema. Pulmonary/Chest: Effort normal and breath sounds normal. No respiratory distress. She has no wheezes.  Musculoskeletal: L ring finger with tenosynovitis - popping on flexor surface at PIP. Otherwise ormal range of motion, no effusions. No gross deformities Skin: Skin is warm and dry. No rash noted. No erythema.  Psychiatric: She has a normal mood and affect. Her behavior is normal. Judgment and thought content normal.   Lab Results  Component Value Date   WBC 7.9 03/02/2010   HGB 11.9* 03/02/2010   HCT 35.3* 03/02/2010   PLT 245.0 03/02/2010   CHOL 155 03/13/2011   TRIG 164.0* 03/13/2011   HDL 32.80* 03/13/2011   ALT 38* 03/02/2010   AST 41* 03/02/2010   NA 142 03/02/2010   K 3.8 03/02/2010   CL 105 03/02/2010   CREATININE 0.9 03/13/2011   BUN 14 03/02/2010   CO2  28 03/02/2010   TSH 2.42 03/02/2010   HGBA1C 7.1* 03/13/2011   MICROALBUR 0.7 03/02/2010     Trigger Finger Injection Procedure Note  Pre-operative Diagnosis: left ring finger, trigger finger (flexor tendon nodule)  Post-operative Diagnosis: same  Indications: failure of conservative therapy  Anesthesia: 1cc 1% lidocaine without epi + 40mg  depomedrol (0.5cc)   Procedure Details   Verbal consent was obtained for the procedure. After a sterile Betadine prep, the site was anesthetized with cold spray.  A needle was advanced into the left 4th finger at the a1 pulley nodule without difficulty.  The space was then injected with 0.5 ml of depomedrol 80mg /ml. The area was cleansed with isopropyl alcohol and a dressing was applied.  Complications:  None; patient tolerated the procedure well. Aftercare instructions provided.      Assessment & Plan:  Tenosynovitis, left 4th finger - hx same on right side - failed conservative care - injection done today

## 2011-05-31 NOTE — Patient Instructions (Signed)
It was good to see you today. We injected your trigger finger today - ok to use ice as needed for few minutes if sore at site of injections tonight. Use hand as able, no restrictions Continue to use "double band-aid splint" on your left ring finger as demonstrated today to rest your finger at bedtime for next few nights, then as needed If continued pain or problems, call for repeat injection as we discussed

## 2011-06-07 ENCOUNTER — Other Ambulatory Visit: Payer: Self-pay | Admitting: Internal Medicine

## 2011-06-07 DIAGNOSIS — R079 Chest pain, unspecified: Secondary | ICD-10-CM

## 2011-06-07 DIAGNOSIS — E785 Hyperlipidemia, unspecified: Secondary | ICD-10-CM

## 2011-06-07 DIAGNOSIS — E119 Type 2 diabetes mellitus without complications: Secondary | ICD-10-CM

## 2011-06-12 ENCOUNTER — Other Ambulatory Visit (INDEPENDENT_AMBULATORY_CARE_PROVIDER_SITE_OTHER): Payer: Medicare Other

## 2011-06-12 ENCOUNTER — Encounter: Payer: Self-pay | Admitting: Internal Medicine

## 2011-06-12 ENCOUNTER — Ambulatory Visit (INDEPENDENT_AMBULATORY_CARE_PROVIDER_SITE_OTHER): Payer: Medicare Other | Admitting: Internal Medicine

## 2011-06-12 DIAGNOSIS — E785 Hyperlipidemia, unspecified: Secondary | ICD-10-CM

## 2011-06-12 DIAGNOSIS — E119 Type 2 diabetes mellitus without complications: Secondary | ICD-10-CM

## 2011-06-12 DIAGNOSIS — F411 Generalized anxiety disorder: Secondary | ICD-10-CM

## 2011-06-12 DIAGNOSIS — I10 Essential (primary) hypertension: Secondary | ICD-10-CM

## 2011-06-12 LAB — HEMOGLOBIN A1C: Hgb A1c MFr Bld: 6.9 % — ABNORMAL HIGH (ref 4.6–6.5)

## 2011-06-12 NOTE — Patient Instructions (Signed)
It was good to see you today. Test(s) ordered today. Your results will be called to you after review (48-72hours after test completion). If any changes need to be made, you will be notified at that time. Consider starting sertraline for anxiety symptoms if daily medication is needed Work on lifestyle changes as discussed (low fat, low carb, increased protein diet; improved exercise efforts; weight loss) to control sugar, blood pressure and cholesterol levels and/or reduce risk of developing other medical problems. Look into [email or web address] or other type of food journal to assist you in this process. Please schedule followup in 3-4 months for diabetes check, call sooner if problems.

## 2011-06-12 NOTE — Assessment & Plan Note (Signed)
reviewed role of SSRI and BZ in tx of symptoms  -  declines to start sertraline as rx'd 11/2010 due to feared side effects but will reconsider  No med changes prescribed today

## 2011-06-12 NOTE — Assessment & Plan Note (Signed)
prev rx'd statins including simva and prava but afraid of side effects  -  02/2011 lipids with LDL<100  - will watch off statin, esp given co-dz with dm, htn

## 2011-06-12 NOTE — Assessment & Plan Note (Signed)
On metformin - home cbgs fair Check a1c now Lab Results  Component Value Date   HGBA1C 7.1* 03/13/2011   Lab Results  Component Value Date   CREATININE 0.9 03/13/2011

## 2011-06-12 NOTE — Assessment & Plan Note (Signed)
suboptimal control - increased amlodipine dose 02/2011 - cont max ARB/hctz Work on lifestyle control and weight loss BP Readings from Last 3 Encounters:  06/12/11 136/82  05/31/11 120/72  05/13/11 120/80

## 2011-06-12 NOTE — Progress Notes (Signed)
  Subjective:    Patient ID: Heidi Franklin, female    DOB: 06-03-1946, 65 y.o.   MRN: [id number]  HPI  Here for follow up - reviewed chronic med issues today:  HTN - reports compliance with ongoing medical treatment and no changes in medication dose or frequency. denies adverse side effects related to current therapy.   dyslipidemia - declines statin - but LDL <100 without tx despite being DM  DM2 - reports compliance with ongoing medical treatment and no changes in medication dose or frequency. denies adverse side effects related to current therapy.  home cbgs 130-140, checks sugars few times each week - no signs or symptoms hypoglycemia  chronic bronchitis and allergies - cough remains daily symptom: dry - no mucus or fever or ST, mild hoarseness from nasal drainage - no smoking hx - no COPD or asthma  GERD symptoms - taking omep 20mg  once daily with improvement - the patient reports compliance with medication(s) as prescribed. Denies adverse side effects.  Continued anxiey symptoms - takes BZ prn but prev requested to use during day - never started sertraline as rx'd 11/2010  Trigger finger s/p cortisone injection 05/31/11 - less popping in L 4th finger but still stiffness  Past Medical History  Diagnosis Date  . BRONCHITIS, CHRONIC   . ALLERGIC RHINITIS   . Anxiety state, unspecified   . DEPRESSION   . DIABETES MELLITUS, TYPE II   . GERD   . HYPERLIPIDEMIA   . HYPERTENSION      Review of Systems  Constitutional: Negative for fever.  Respiratory: Negative for shortness of breath and wheezing.   Cardiovascular: Negative for chest pain.  Musculoskeletal: Negative for back pain.       Objective:   Physical Exam  BP 136/82  Pulse 84  Temp(Src) 98.5 F (36.9 C) (Oral)  Ht 5\' 2"  (1.575 m)  Wt 174 lb (78.926 kg)  BMI 31.83 kg/m2  SpO2 98% Constitutional: She is oriented to person, place, and time. She appears well-developed and well-nourished. No distress.  Eyes:  Conjunctivae and EOM are normal. Pupils are equal, round, and reactive to light. No scleral icterus.  Neck: Normal range of motion. Neck supple. No JVD present. No thyromegaly present.  Cardiovascular: Normal rate, regular rhythm and normal heart sounds.  No murmur heard. No BLE edema. Pulmonary/Chest: Effort normal and breath sounds normal. No respiratory distress. She has no wheezes.  Psychiatric: She has a normal mood and affect. Her behavior is normal. Judgment and thought content normal.   Lab Results  Component Value Date   WBC 7.9 03/02/2010   HGB 11.9* 03/02/2010   HCT 35.3* 03/02/2010   PLT 245.0 03/02/2010   CHOL 155 03/13/2011   TRIG 164.0* 03/13/2011   HDL 32.80* 03/13/2011   ALT 38* 03/02/2010   AST 41* 03/02/2010   NA 142 03/02/2010   K 3.8 03/02/2010   CL 105 03/02/2010   CREATININE 0.9 03/13/2011   BUN 14 03/02/2010   CO2 28 03/02/2010   TSH 2.42 03/02/2010   HGBA1C 7.1* 03/13/2011   MICROALBUR 0.7 03/02/2010   Lab Results  Component Value Date   LDLCALC 89 03/13/2011        Assessment & Plan:  See problem list. Medications and labs reviewed today.

## 2011-06-14 ENCOUNTER — Other Ambulatory Visit: Payer: Self-pay | Admitting: *Deleted

## 2011-06-14 MED ORDER — ALPRAZOLAM 0.5 MG PO TABS: 0.5000 mg | ORAL_TABLET | Freq: Three times a day (TID) | ORAL | Status: DC | PRN

## 2011-06-14 NOTE — Telephone Encounter (Signed)
Faxed script back to [facility] @ [phone].Marland KitchenMarland Kitchen8/17/12@1 :24pm/LMB

## 2011-06-15 ENCOUNTER — Other Ambulatory Visit: Payer: Self-pay | Admitting: Internal Medicine

## 2011-06-17 ENCOUNTER — Other Ambulatory Visit: Payer: Self-pay | Admitting: *Deleted

## 2011-06-17 NOTE — Telephone Encounter (Signed)
A user error has taken place: encounter opened in error, closed for administrative reasons . Already sent over renewal for BP meds....06/17/11@1 :18pm/LMB

## 2011-06-18 ENCOUNTER — Other Ambulatory Visit: Payer: Self-pay

## 2011-06-18 MED ORDER — ALBUTEROL SULFATE HFA 108 (90 BASE) MCG/ACT IN AERS: 2.0000 | INHALATION_SPRAY | Freq: Four times a day (QID) | RESPIRATORY_TRACT | Status: DC | PRN

## 2011-06-19 ENCOUNTER — Ambulatory Visit (HOSPITAL_COMMUNITY): Payer: Medicare Other | Attending: Internal Medicine | Admitting: Radiology

## 2011-06-19 DIAGNOSIS — R079 Chest pain, unspecified: Secondary | ICD-10-CM

## 2011-06-19 DIAGNOSIS — R0789 Other chest pain: Secondary | ICD-10-CM

## 2011-06-19 DIAGNOSIS — E119 Type 2 diabetes mellitus without complications: Secondary | ICD-10-CM | POA: Insufficient documentation

## 2011-06-19 DIAGNOSIS — E785 Hyperlipidemia, unspecified: Secondary | ICD-10-CM | POA: Insufficient documentation

## 2011-06-19 MED ORDER — TECHNETIUM TC 99M TETROFOSMIN IV KIT
33.0000 | PACK | Freq: Once | INTRAVENOUS | Status: AC | PRN
  Administered 2011-06-19: 33 via INTRAVENOUS

## 2011-06-19 MED ORDER — TECHNETIUM TC 99M TETROFOSMIN IV KIT
11.0000 | PACK | Freq: Once | INTRAVENOUS | Status: AC | PRN
  Administered 2011-06-19: 11 via INTRAVENOUS

## 2011-06-19 NOTE — Progress Notes (Signed)
[facility] SITE 3 NUCLEAR MED [address] [place] [address] [phone]  Cardiology Nuclear Med Study  Heidi Franklin is a 65 y.o. female [id number] [birth year]   Nuclear Med Background Indication for Stress Test:  Evaluation for Ischemia and Abnormal EKG History:  Chronic Bronchitis Cardiac Risk Factors: Hypertension, Lipids and NIDDM  Symptoms:  Chest Pressure, DOE and SOB   Nuclear Pre-Procedure Caffeine/Decaff Intake:  None NPO After: 10:00pm   Lungs:  clear IV 0.9% NS with Angio Cath:  20g  IV Site: R Forearm  IV Started by:  Stanton Kidney, EMT-P  Chest Size (in):  40  Cup Size: DD  Height: 5' (1.524 m)  Weight:  172 lb (78.019 kg)  BMI:  Body mass index is 33.59 kg/(m^2). Tech Comments:  Diabetic meds held this am, per patient.    Nuclear Med Study 1 or 2 day study: 1 day  Stress Test Type:  Stress  Reading MD: Charlton Haws, MD  Order Authorizing Provider:  V.Leschber  Resting Radionuclide: Technetium 24m Tetrofosmin  Resting Radionuclide Dose: 11.0 mCi   Stress Radionuclide:  Technetium 58m Tetrofosmin  Stress Radionuclide Dose: 33.0 mCi           Stress Protocol Rest HR: 62 Stress HR: 146  Rest BP: 125/63 Stress BP: 185/61  Exercise Time (min): 4:15 METS: 6.10   Predicted Max HR: 154 bpm % Max HR: 94.81 bpm Rate Pressure Product: 78469   Dose of Adenosine (mg):  n/a Dose of Lexiscan: n/a mg  Dose of Atropine (mg): n/a Dose of Dobutamine: n/a mcg/kg/min (at max HR)  Stress Test Technologist: Milana Na, EMT-P  Nuclear Technologist:  Doyne Keel, CNMT     Rest Procedure:  Myocardial perfusion imaging was performed at rest 45 minutes following the intravenous administration of Technetium 43m Tetrofosmin. Rest ECG: SR with arrhythmia  Stress Procedure:  The patient exercised for 4:15.  The patient stopped due to fatigue and denied any chest pain.  There were no significant ST-T wave changes.  Technetium 35m Tetrofosmin was injected  at peak exercise and myocardial perfusion imaging was performed after a brief delay. Stress ECG: No significant change from baseline ECG  QPS Raw Data Images:  Normal; no motion artifact; normal heart/lung ratio. Stress Images:  Normal homogeneous uptake in all areas of the myocardium. Rest Images:  Normal homogeneous uptake in all areas of the myocardium. Subtraction (SDS):  Normal Transient Ischemic Dilatation (Normal <1.22):  0.96 Lung/Heart Ratio (Normal <0.45):  0.29  Quantitative Gated Spect Images QGS EDV:  43 ml QGS ESV:  14 ml QGS cine images:  NL LV Function; NL Wall Motion QGS EF: 69%  Impression Exercise Capacity:  Poor exercise capacity. BP Response:  Normal blood pressure response. Clinical Symptoms:  No chest pain. ECG Impression:  No significant ST segment change suggestive of ischemia. Comparison with Prior Nuclear Study: No images to compare  Overall Impression:  Normal stress nuclear study.     Charlton Haws

## 2011-06-27 ENCOUNTER — Other Ambulatory Visit: Payer: Self-pay | Admitting: Internal Medicine

## 2011-06-28 ENCOUNTER — Telehealth: Payer: Self-pay

## 2011-06-28 NOTE — Telephone Encounter (Signed)
Hard to say from this small information; I would suspect the spray is not the cause since this is not at all a typical side effect (I would disregard the SE info on the med she may have read);  Other typical cuases include worsening allergy flare, acute infection, or even migraine  To be safe, she can stop the spray, and take an antihistamine such as allegra OTC if not already taking a rx med

## 2011-06-28 NOTE — Telephone Encounter (Signed)
Pt called stating she has been experiencing sinus headache x 2-3 days. Pt believes nasal spray is the cause. Pt is requesting advisement form MD.

## 2011-06-28 NOTE — Telephone Encounter (Signed)
Pt advised and will stop nasal spray and call back if no improvement or worsening of sxs. Pt BP is 120/68

## 2011-08-20 ENCOUNTER — Encounter: Payer: Self-pay | Admitting: Internal Medicine

## 2011-08-20 ENCOUNTER — Ambulatory Visit (INDEPENDENT_AMBULATORY_CARE_PROVIDER_SITE_OTHER): Payer: Medicare Other | Admitting: Internal Medicine

## 2011-08-20 ENCOUNTER — Other Ambulatory Visit (INDEPENDENT_AMBULATORY_CARE_PROVIDER_SITE_OTHER): Payer: Medicare Other

## 2011-08-20 VITALS — BP 128/78 | HR 85 | Temp 98.0°F | Ht 62.0 in | Wt 176.0 lb

## 2011-08-20 DIAGNOSIS — R5381 Other malaise: Secondary | ICD-10-CM

## 2011-08-20 DIAGNOSIS — R5383 Other fatigue: Secondary | ICD-10-CM

## 2011-08-20 DIAGNOSIS — K219 Gastro-esophageal reflux disease without esophagitis: Secondary | ICD-10-CM

## 2011-08-20 DIAGNOSIS — Z23 Encounter for immunization: Secondary | ICD-10-CM

## 2011-08-20 DIAGNOSIS — F411 Generalized anxiety disorder: Secondary | ICD-10-CM

## 2011-08-20 LAB — CBC WITH DIFFERENTIAL/PLATELET
Basophils Absolute: 0 10*3/uL (ref 0.0–0.1)
Basophils Relative: 0.4 % (ref 0.0–3.0)
Eosinophils Absolute: 0.1 10*3/uL (ref 0.0–0.7)
Eosinophils Relative: 1.5 % (ref 0.0–5.0)
HCT: 36.3 % (ref 36.0–46.0)
Hemoglobin: 12.3 g/dL (ref 12.0–15.0)
Lymphocytes Relative: 27.3 % (ref 12.0–46.0)
Lymphs Abs: 2.5 10*3/uL (ref 0.7–4.0)
MCHC: 33.9 g/dL (ref 30.0–36.0)
MCV: 91.2 fl (ref 78.0–100.0)
Monocytes Absolute: 0.7 10*3/uL (ref 0.1–1.0)
Monocytes Relative: 7.4 % (ref 3.0–12.0)
Neutro Abs: 5.8 10*3/uL (ref 1.4–7.7)
Neutrophils Relative %: 63.4 % (ref 43.0–77.0)
Platelets: 242 10*3/uL (ref 150.0–400.0)
RBC: 3.98 Mil/uL (ref 3.87–5.11)
RDW: 13.2 % (ref 11.5–14.6)
WBC: 9.1 10*3/uL (ref 4.5–10.5)

## 2011-08-20 LAB — TSH: TSH: 3.19 u[IU]/mL (ref 0.35–5.50)

## 2011-08-20 MED ORDER — SERTRALINE HCL 50 MG PO TABS: 50.0000 mg | ORAL_TABLET | Freq: Every day | ORAL | Status: DC

## 2011-08-20 MED ORDER — OMEPRAZOLE 40 MG PO CPDR: 40.0000 mg | DELAYED_RELEASE_CAPSULE | Freq: Every day | ORAL | Status: DC

## 2011-08-20 NOTE — Assessment & Plan Note (Addendum)
Started sertraline SSRI 05/2011 - titrate up same now (increase symptoms precipitated by loss of ome/move/spouse illness)-  ok to continue prn BZ for symptoms  -  Also refer to behav health

## 2011-08-20 NOTE — Patient Instructions (Signed)
It was good to see you today. Test(s) ordered today. Your results will be called to you after review (48-72hours after test completion). If any changes need to be made, you will be notified at that time. Increase sertraline dose and omeprazole dose for anxiety and reflux - symptoms - Your prescription(s) have been submitted to your pharmacy. Please take as directed and contact our office if you believe you are having problem(s) with the medication(s). we'll make referral to behavioral health. Our office will contact you regarding appointment(s) once made. Please schedule followup in 6 weeks to review anxiety and stress, call sooner if problems.

## 2011-08-20 NOTE — Assessment & Plan Note (Signed)
Encourage PPI compliance

## 2011-08-20 NOTE — Progress Notes (Signed)
  Subjective:    Patient ID: Heidi Franklin, female    DOB: Feb 10, 1946, 65 y.o.   MRN: [id number]  Gastrophageal Reflux She reports no chest pain or no wheezing. Associated symptoms include fatigue.   Here for follow up - reviewed chronic med issues today:  HTN - reports compliance with ongoing medical treatment and no changes in medication dose or frequency. denies adverse side effects related to current therapy.   dyslipidemia - declines statin - but LDL <100 without tx despite being DM  DM2 - reports compliance with ongoing medical treatment and no changes in medication dose or frequency. denies adverse side effects related to current therapy.  home cbgs 130-140, checks sugars few times each week - no signs or symptoms hypoglycemia  chronic bronchitis and allergies - cough remains daily symptom: dry - no mucus or fever or ST, mild hoarseness from nasal drainage - no smoking hx - no COPD or asthma  GERD symptoms - previously taking omep 20mg  once daily with improvement, but intermittent use and now increase symptoms. Denies adverse side effects.  Continued anxiey symptoms - takes BZ prn but prev requested to use during day - started sertraline summer 2012   Trigger finger s/p cortisone injection 05/31/11 - less popping in L 4th finger but still stiffness  Past Medical History  Diagnosis Date  . BRONCHITIS, CHRONIC   . ALLERGIC RHINITIS   . Anxiety state, unspecified   . DEPRESSION   . DIABETES MELLITUS, TYPE II   . GERD   . HYPERLIPIDEMIA   . HYPERTENSION      Review of Systems  Constitutional: Positive for fatigue. Negative for fever and unexpected weight change.  Respiratory: Negative for shortness of breath and wheezing.   Cardiovascular: Positive for palpitations. Negative for chest pain.  Musculoskeletal: Negative for back pain.       Objective:   Physical Exam  BP 128/78  Pulse 85  Temp(Src) 98 F (36.7 C) (Oral)  Ht 5\' 2"  (1.575 m)  Wt 176 lb (79.833 kg)  BMI  32.19 kg/m2  SpO2 97% Constitutional: She appears well-developed and well-nourished. No distress.  Eyes: Conjunctivae and EOM are normal. Pupils are equal, round, and reactive to light. No scleral icterus.  Neck: Normal range of motion. Neck supple. No JVD present. No thyromegaly present.  Cardiovascular: Normal rate, regular rhythm and normal heart sounds.  No murmur heard. No BLE edema. Abd: SNTND, +BS Pulmonary/Chest: Effort normal and breath sounds normal. No respiratory distress. She has no wheezes.  Psychiatric: She has an anxious mood and affect. Her behavior is normal. Judgment and thought content normal.   Lab Results  Component Value Date   WBC 7.9 03/02/2010   HGB 11.9* 03/02/2010   HCT 35.3* 03/02/2010   PLT 245.0 03/02/2010   CHOL 155 03/13/2011   TRIG 164.0* 03/13/2011   HDL 32.80* 03/13/2011   ALT 38* 03/02/2010   AST 41* 03/02/2010   NA 142 03/02/2010   K 3.8 03/02/2010   CL 105 03/02/2010   CREATININE 0.9 03/13/2011   BUN 14 03/02/2010   CO2 28 03/02/2010   TSH 2.42 03/02/2010   HGBA1C 6.9* 06/12/2011   MICROALBUR 0.7 03/02/2010   Lab Results  Component Value Date   LDLCALC 89 03/13/2011        Assessment & Plan:  See problem list. Medications and labs reviewed today.  Fatigue - nonsp hx and exam - check labs

## 2011-09-06 ENCOUNTER — Ambulatory Visit (INDEPENDENT_AMBULATORY_CARE_PROVIDER_SITE_OTHER): Payer: Medicare Other | Admitting: Licensed Clinical Social Worker

## 2011-09-06 DIAGNOSIS — F411 Generalized anxiety disorder: Secondary | ICD-10-CM

## 2011-09-12 ENCOUNTER — Ambulatory Visit: Payer: Medicare Other | Admitting: Internal Medicine

## 2011-09-23 ENCOUNTER — Ambulatory Visit (INDEPENDENT_AMBULATORY_CARE_PROVIDER_SITE_OTHER): Payer: Medicare Other | Admitting: Licensed Clinical Social Worker

## 2011-09-23 DIAGNOSIS — F4323 Adjustment disorder with mixed anxiety and depressed mood: Secondary | ICD-10-CM

## 2011-09-26 LAB — HM MAMMOGRAPHY

## 2011-09-30 ENCOUNTER — Encounter: Payer: Self-pay | Admitting: Internal Medicine

## 2011-10-02 ENCOUNTER — Other Ambulatory Visit (INDEPENDENT_AMBULATORY_CARE_PROVIDER_SITE_OTHER): Payer: Medicare Other

## 2011-10-02 ENCOUNTER — Ambulatory Visit (INDEPENDENT_AMBULATORY_CARE_PROVIDER_SITE_OTHER): Payer: Medicare Other | Admitting: Internal Medicine

## 2011-10-02 ENCOUNTER — Other Ambulatory Visit: Payer: Self-pay | Admitting: *Deleted

## 2011-10-02 ENCOUNTER — Ambulatory Visit (INDEPENDENT_AMBULATORY_CARE_PROVIDER_SITE_OTHER)
Admission: RE | Admit: 2011-10-02 | Discharge: 2011-10-02 | Disposition: A | Discharge status: Home / Self Care | Payer: Medicare Other | Source: Ambulatory Visit | Attending: Internal Medicine | Admitting: Internal Medicine

## 2011-10-02 ENCOUNTER — Encounter: Payer: Self-pay | Admitting: Internal Medicine

## 2011-10-02 DIAGNOSIS — R05 Cough: Secondary | ICD-10-CM

## 2011-10-02 DIAGNOSIS — J42 Unspecified chronic bronchitis: Secondary | ICD-10-CM

## 2011-10-02 DIAGNOSIS — E119 Type 2 diabetes mellitus without complications: Secondary | ICD-10-CM

## 2011-10-02 DIAGNOSIS — Z23 Encounter for immunization: Secondary | ICD-10-CM

## 2011-10-02 DIAGNOSIS — R059 Cough, unspecified: Secondary | ICD-10-CM

## 2011-10-02 LAB — HEMOGLOBIN A1C: Hgb A1c MFr Bld: 6.8 % — ABNORMAL HIGH (ref 4.6–6.5)

## 2011-10-02 MED ORDER — TIOTROPIUM BROMIDE MONOHYDRATE 18 MCG IN CAPS: 18.0000 ug | ORAL_CAPSULE | Freq: Every day | RESPIRATORY_TRACT | Status: DC

## 2011-10-02 MED ORDER — FLUTICASONE-SALMETEROL 250-50 MCG/DOSE IN AEPB: 1.0000 | INHALATION_SPRAY | Freq: Two times a day (BID) | RESPIRATORY_TRACT | Status: DC

## 2011-10-02 NOTE — Telephone Encounter (Signed)
Notified pt with md response...10/02/11@1 :31pm/LMB

## 2011-10-02 NOTE — Telephone Encounter (Signed)
Pt states she forgot to ask md if she need to continue taking baby aspirin...10/02/11@9 :50am/LMB

## 2011-10-02 NOTE — Progress Notes (Signed)
  Subjective:    Patient ID: Heidi Franklin, female    DOB: 1946-02-28, 65 y.o.   MRN: [id number]  HPI Here for follow up - reviewed chronic med issues today:  HTN - reports compliance with ongoing medical treatment and no changes in medication dose or frequency. denies adverse side effects related to current therapy.   dyslipidemia - declines statin - but LDL <100 without tx despite being DM  DM2 - reports compliance with ongoing medical treatment and no changes in medication dose or frequency. denies adverse side effects related to current therapy.  home cbgs 130-140, checks sugars few times each week - no signs or symptoms hypoglycemia  chronic bronchitis and allergies - cough remains daily symptom: dry - no mucus or fever or ST, mild hoarseness from nasal drainage - no smoking hx - no COPD or asthma  GERD symptoms - on omep with improvement, but intermittent use. Denies adverse side effects.  Continued anxiey symptoms - takes BZ prn but prev requested to use during day - started sertraline summer 2012   Trigger finger s/p cortisone injection 05/31/11 - less popping in L 4th finger but still stiffness  Past Medical History  Diagnosis Date  . BRONCHITIS, CHRONIC   . ALLERGIC RHINITIS   . Anxiety state, unspecified   . DEPRESSION   . DIABETES MELLITUS, TYPE II   . GERD   . HYPERLIPIDEMIA   . HYPERTENSION     Review of Systems  Constitutional: Positive for fatigue. Negative for fever and unexpected weight change.  Respiratory: Positive for cough and chest tightness. Negative for shortness of breath.   Cardiovascular: Negative for chest pain and palpitations.  Musculoskeletal: Negative for back pain.       Objective:   Physical Exam  BP 132/78  Pulse 81  Temp(Src) 98.7 F (37.1 C) (Oral)  Ht 5\' 2"  (1.575 m)  Wt 174 lb (78.926 kg)  BMI 31.83 kg/m2  SpO2 97% Wt Readings from Last 3 Encounters:  10/02/11 174 lb (78.926 kg)  08/20/11 176 lb (79.833 kg)  06/19/11 172 lb  (78.019 kg)   Constitutional: She appears well-developed and well-nourished. No distress.  Neck: Normal range of motion. Neck supple. No JVD present. No thyromegaly present.  Cardiovascular: Normal rate, regular rhythm and normal heart sounds.  No murmur heard. No BLE edema. Pulmonary/Chest: Effort normal and breath sounds normal. No respiratory distress. She has no wheezes.  Psychiatric: She has an anxious mood and affect. Her behavior is normal. Judgment and thought content normal.   Lab Results  Component Value Date   WBC 9.1 08/20/2011   HGB 12.3 08/20/2011   HCT 36.3 08/20/2011   PLT 242.0 08/20/2011   CHOL 155 03/13/2011   TRIG 164.0* 03/13/2011   HDL 32.80* 03/13/2011   ALT 38* 03/02/2010   AST 41* 03/02/2010   NA 142 03/02/2010   K 3.8 03/02/2010   CL 105 03/02/2010   CREATININE 0.9 03/13/2011   BUN 14 03/02/2010   CO2 28 03/02/2010   TSH 3.19 08/20/2011   HGBA1C 6.9* 06/12/2011   MICROALBUR 0.7 03/02/2010   Lab Results  Component Value Date   LDLCALC 89 03/13/2011        Assessment & Plan:  See problem list. Medications and labs reviewed today.

## 2011-10-02 NOTE — Assessment & Plan Note (Signed)
On metformin - home cbgs fair Check a1c now, adjust if needed Lab Results  Component Value Date   HGBA1C 6.9* 06/12/2011   Lab Results  Component Value Date   CREATININE 0.9 03/13/2011

## 2011-10-02 NOTE — Patient Instructions (Addendum)
It was good to see you today. Test(s) ordered today. Your results will be called to you after review (48-72hours after test completion). If any changes need to be made, you will be notified at that time. Add spiriva inhaler everyday for four cough symptoms - ok to still use albuterol if needed Your prescription(s) have been submitted to your pharmacy. Please take as directed and contact our office if you believe you are having problem(s) with the medication(s). Other medications reviewed, no changes at this time. Please schedule followup in 3-4 months for review of DM, call sooner if problems.  Pneumonia booster shot given today

## 2011-10-02 NOTE — Telephone Encounter (Signed)
Change Spiriva to Advair Yes continue baby aspirin

## 2011-10-02 NOTE — Assessment & Plan Note (Signed)
Chronic dry cough Check cxr now Add spiriva to [facility] prn

## 2011-10-02 NOTE — Telephone Encounter (Signed)
Pt call left msg on vm inhaler that md rx insurance will not cover cost $ 266.00. Requesting md to rx something else....10/02/11@12 :19pm/LMB

## 2011-10-03 ENCOUNTER — Telehealth: Payer: Self-pay | Admitting: *Deleted

## 2011-10-03 NOTE — Telephone Encounter (Signed)
Notified pt have advair sample for her to pick-up...10/03/11@12 :17pm/LMB

## 2011-10-03 NOTE — Telephone Encounter (Signed)
Pt left on vm advair inhaler cost as much as previous inhaler md rx. Can't afford at this time. Want to know if md have sample of either inhaler....10/04/11@11 :05am/LMB

## 2011-10-04 ENCOUNTER — Encounter: Payer: Self-pay | Admitting: Internal Medicine

## 2011-10-07 ENCOUNTER — Telehealth: Payer: Self-pay | Admitting: *Deleted

## 2011-10-07 DIAGNOSIS — R05 Cough: Secondary | ICD-10-CM

## 2011-10-07 DIAGNOSIS — R053 Chronic cough: Secondary | ICD-10-CM

## 2011-10-07 NOTE — Telephone Encounter (Signed)
Notified pt with md response. Also pt wanted to inform will be using [facility] for mail order. ID # [id number]....10/07/11@1 :19pm/LMB

## 2011-10-07 NOTE — Telephone Encounter (Signed)
Pt states she is still having constantly dry cough. Been using the advair inhaler that was rx but not helping. Denies fever, SOB. Want to know what md recommends...10/07/11@10 :02am/LMB

## 2011-10-07 NOTE — Telephone Encounter (Signed)
Refer to pulm - no medication changes recommended

## 2011-10-08 ENCOUNTER — Ambulatory Visit (INDEPENDENT_AMBULATORY_CARE_PROVIDER_SITE_OTHER): Payer: Medicare Other | Admitting: Pulmonary Disease

## 2011-10-08 ENCOUNTER — Encounter: Payer: Self-pay | Admitting: Pulmonary Disease

## 2011-10-08 VITALS — BP 120/68 | HR 86 | Temp 98.6°F | Ht 60.0 in | Wt 178.6 lb

## 2011-10-08 DIAGNOSIS — J42 Unspecified chronic bronchitis: Secondary | ICD-10-CM

## 2011-10-08 NOTE — Patient Instructions (Signed)
Nasal irrigation (saline nasal spray) daily until next visit Flonase two sprays daily after using nasal irrigation>>use until next visit Zyrtec daily as needed Use benadryl 25 mg at night for next four nights, then as needed for sinus congestion Salt water gargle once or twice per day as needed Use sugarless candy to keep mouth moist Take a sip of water when you have urge to cough Avoid cough drops Will schedule breathing test (PFT) Stop using advair>>call if cough gets worse after stopping advair Follow up in 6 weeks

## 2011-10-08 NOTE — Progress Notes (Signed)
Chief Complaint  Patient presents with  . Pulmonary consult for chronic cough    Pt c/o lingering cough for 3 weeks after bronchitis.    History of Present Illness: Heidi Franklin is a 65 y.o. female for evaluation of cough.  She has noticed trouble with a cough for years.  She used to get bronchitis frequently as a child.  She was told she had allergies to dust mites and cockroaches.  She was on allergy shots before, but not recently.  She was never told she had asthma before.    She had a respiratory infection about 3 weeks ago, and her cough got worse.  She was started on advair, but is not sure this has helped.  She was prescribed spiriva, but did not fill this due to expense.  Her cough is usually dry, but she will occasionally bring up clear sputum.  She denies hemoptysis.  She gets sinus congestion, and feels drainage down the back of her throat.  She gets a tickle in her throat, and this causes her to cough.  She denies any difficulty with swallowing.  She does have reflux.  She use 40 mg omeprazole in the morning, but still feels her heartburn is a problem.  She has used flonase and zyrtec, but not on a regular basis.  These seem to help some.  She denies skin rashes, gland swelling, wheeze, chest tightness, chest pain, palpitations, abdominal pain, ear pain, or fever.  She is from [place], and denies recent travel.  She denies recent sick exposures.  She does not have any pets.  She is retired from an Scientist, forensic.  Her husband owns a Education officer, environmental business.  She will work sometimes, and is exposed to cleaning solutions.  She does not have any more trouble with her cough with these exposures.  Past Medical History  Diagnosis Date  . BRONCHITIS, CHRONIC   . ALLERGIC RHINITIS   . Anxiety state, unspecified   . DEPRESSION   . DIABETES MELLITUS, TYPE II   . GERD   . HYPERLIPIDEMIA   . HYPERTENSION     Past Surgical History  Procedure Date  . Abdominal hysterectomy 1980    . Trigger finger release     Index finger    Current Outpatient Prescriptions on File Prior to Visit  Medication Sig Dispense Refill  . albuterol (PROVENTIL HFA) 108 (90 BASE) MCG/ACT inhaler Inhale 2 puffs into the lungs every 6 (six) hours as needed for wheezing or shortness of breath.  1 Inhaler  3  . ALPRAZolam (XANAX) 0.5 MG tablet Take 1 tablet (0.5 mg total) by mouth every 8 (eight) hours as needed.  30 tablet  2  . amLODipine (NORVASC) 10 MG tablet Take 1 tablet (10 mg total) by mouth daily.  30 tablet  11  . aspirin 81 MG chewable tablet Chew 1 tablet (81 mg total) by mouth daily.  30 tablet  11  . fluticasone (FLONASE) 50 MCG/ACT nasal spray instill 1 spray into each nostril twice a day  16 g  1  . glucose blood (ONE TOUCH ULTRA TEST) test strip Use as instructed  100 each  5  . Lancets (ONETOUCH ULTRASOFT) lancets PRN  100 each  5  . loperamide (IMODIUM A-D) 2 MG tablet Take 2 mg by mouth as needed.       Marland Kitchen losartan-hydrochlorothiazide (HYZAAR) 100-25 MG per tablet Take 1 tablet by mouth daily.  30 tablet  5  . metFORMIN (GLUCOPHAGE) 500 MG  tablet Take 500 mg by mouth 2 (two) times daily with a meal.        . Multiple Vitamin (MULTIVITAMIN) tablet Take 1 tablet by mouth daily.        . Omega-3 Fatty Acids (FISH OIL) 1000 MG CAPS Take 2 capsules by mouth daily.       Marland Kitchen omeprazole (PRILOSEC) 40 MG capsule Take 1 capsule (40 mg total) by mouth daily.  30 capsule  6  . potassium chloride (KLOR-CON) 20 MEQ packet Take 20 mEq by mouth daily.        . sertraline (ZOLOFT) 50 MG tablet Take 1 tablet (50 mg total) by mouth daily.  30 tablet  6  . Fluticasone-Salmeterol (ADVAIR DISKUS) 250-50 MCG/DOSE AEPB Inhale 1 puff into the lungs 2 (two) times daily.  60 each  3  . mupirocin (BACTROBAN) 2 % ointment Apply 1 application topically 3 (three) times daily.        . naproxen (NAPROSYN) 500 MG tablet Take 1 tablet (500 mg total) by mouth 2 (two) times daily with a meal.  60 tablet  2     Allergies  Allergen Reactions  . Penicillins     family history includes Diabetes in her father; Hyperlipidemia in her other; Hypertension in her other; and Stroke in her mother.   reports that she has never smoked. She does not have any smokeless tobacco history on file. She reports that she does not drink alcohol or use illicit drugs.  Review of Systems  Constitutional: Negative.   HENT: Positive for sore throat, sneezing and dental problem.   Eyes: Negative.   Respiratory: Positive for cough.   Cardiovascular: Negative.   Genitourinary: Negative.   Musculoskeletal: Negative.   Skin: Negative.   Neurological: Positive for headaches.  Hematological: Negative.   Psychiatric/Behavioral: The patient is nervous/anxious.     Blood pressure 120/68, pulse 86, temperature 98.6 F (37 C), temperature source Oral, height 5' (1.524 m), weight 178 lb 9.6 oz (81.012 kg), SpO2 100.00%. Body mass index is 34.88 kg/(m^2).  Physical Exam:  General - Obese  HEENT - PERRLA, septal deviation, clear nasal discharge, no sinus tenderness, no oral exudate, mild erythema posterior pharynx, no LAN, no thyromegaly, TM clear Cardiac - s1s2 regular, no murmur Chest - no wheeze/rales/dullness Abdomen - soft, nontender Extremities - no e/c/c Neurologic - normal strength, CN intact Skin - no rashes Psychiatric - normal mood, behavior  10/02/2011   *RADIOLOGY REPORT*  Clinical Data: Cough for 3 weeks.  High blood pressure.  Nonsmoker. Chronic bronchitis.   CHEST - 2 VIEW  Comparison: None.  Findings: No infiltrate, congestive heart failure or pneumothorax.  No plain film evidence of pulmonary malignancy.  If there is a persistent unexplained cough, follow-up imaging may be considered.  Heart size within normal limits.  IMPRESSION: No acute abnormality.  Please see above.   Original Report Authenticated By: Fuller Canada, M.D.   Assessment/Plan:  Chronic cough She has chronic cough.  There is no  history of smoking, and her recent chest xray was unremarkable.  Her cough is likely related to sinus congestion with post-nasal drip.  I have advised her to use nasal irrigation and flonase on a regular basis until next visit.  She is to also use benadryl 25 mg at night for the next four nights, and then as needed.  She is to also continue zyrtec as needed.  I have advised her to avoid cough drops.  She can use salt  water gargles, and sugarless candy.  Advised for her to use a sip of water when she has sensation to cough.  She does also have reflux, and this may be contributing to upper airway irritation.  Will defer further assessment of this to primary care.  She may have asthma, but I am less convinced of this.  Advised her to stop advair for now.  She is to call if her cough gets worse after stopping advair.  Will arrange for pulmonary function test to further assess.     Outpatient Encounter Prescriptions as of 10/08/2011  Medication Sig Dispense Refill  . albuterol (PROVENTIL HFA) 108 (90 BASE) MCG/ACT inhaler Inhale 2 puffs into the lungs every 6 (six) hours as needed for wheezing or shortness of breath.  1 Inhaler  3  . ALPRAZolam (XANAX) 0.5 MG tablet Take 1 tablet (0.5 mg total) by mouth every 8 (eight) hours as needed.  30 tablet  2  . amLODipine (NORVASC) 10 MG tablet Take 1 tablet (10 mg total) by mouth daily.  30 tablet  11  . aspirin 81 MG chewable tablet Chew 1 tablet (81 mg total) by mouth daily.  30 tablet  11  . fluticasone (FLONASE) 50 MCG/ACT nasal spray instill 1 spray into each nostril twice a day  16 g  1  . glucose blood (ONE TOUCH ULTRA TEST) test strip Use as instructed  100 each  5  . HYDROcodone-homatropine (HYDROMET) 5-1.5 MG/5ML syrup Take 5 mLs by mouth every 6 (six) hours as needed.        . Lancets (ONETOUCH ULTRASOFT) lancets PRN  100 each  5  . loperamide (IMODIUM A-D) 2 MG tablet Take 2 mg by mouth as needed.       Marland Kitchen losartan-hydrochlorothiazide (HYZAAR)  100-25 MG per tablet Take 1 tablet by mouth daily.  30 tablet  5  . metFORMIN (GLUCOPHAGE) 500 MG tablet Take 500 mg by mouth 2 (two) times daily with a meal.        . Multiple Vitamin (MULTIVITAMIN) tablet Take 1 tablet by mouth daily.        . Omega-3 Fatty Acids (FISH OIL) 1000 MG CAPS Take 2 capsules by mouth daily.       Marland Kitchen omeprazole (PRILOSEC) 40 MG capsule Take 1 capsule (40 mg total) by mouth daily.  30 capsule  6  . potassium chloride (KLOR-CON) 20 MEQ packet Take 20 mEq by mouth daily.        . sertraline (ZOLOFT) 50 MG tablet Take 1 tablet (50 mg total) by mouth daily.  30 tablet  6  . vitamin E 400 UNIT capsule Take 400 Units by mouth daily.        . Fluticasone-Salmeterol (ADVAIR DISKUS) 250-50 MCG/DOSE AEPB Inhale 1 puff into the lungs 2 (two) times daily.  60 each  3  . mupirocin (BACTROBAN) 2 % ointment Apply 1 application topically 3 (three) times daily.        . naproxen (NAPROSYN) 500 MG tablet Take 1 tablet (500 mg total) by mouth 2 (two) times daily with a meal.  60 tablet  2  . DISCONTD: cholecalciferol (VITAMIN D) 1000 UNITS tablet Take 1,000 Units by mouth daily.          Adilee Lemme Pager:  [phone] 10/08/2011, 5:15 PM

## 2011-10-08 NOTE — Assessment & Plan Note (Addendum)
She has chronic cough.  There is no history of smoking, and her recent chest xray was unremarkable.  Her cough is likely related to sinus congestion with post-nasal drip.  I have advised her to use nasal irrigation and flonase on a regular basis until next visit.  She is to also use benadryl 25 mg at night for the next four nights, and then as needed.  She is to also continue zyrtec as needed.  I have advised her to avoid cough drops.  She can use salt water gargles, and sugarless candy.  Advised for her to use a sip of water when she has sensation to cough.  She does also have reflux, and this may be contributing to upper airway irritation.  Will defer further assessment of this to primary care.  She may have asthma, but I am less convinced of this.  Advised her to stop advair for now.  She is to call if her cough gets worse after stopping advair.  Will arrange for pulmonary function test to further assess.

## 2011-10-08 NOTE — Progress Notes (Deleted)
  Subjective:    Patient ID: Heidi Franklin, female    DOB: 1946-01-29, 65 y.o.   MRN: [id number]  HPI    Review of Systems  Constitutional: Negative.   HENT: Positive for sore throat, sneezing and dental problem.   Eyes: Negative.   Respiratory: Positive for cough.   Cardiovascular: Negative.   Genitourinary: Negative.   Musculoskeletal: Negative.   Skin: Negative.   Neurological: Positive for headaches.  Hematological: Negative.   Psychiatric/Behavioral: The patient is nervous/anxious.        Objective:   Physical Exam        Assessment & Plan:

## 2011-10-10 ENCOUNTER — Telehealth: Payer: Self-pay | Admitting: Pulmonary Disease

## 2011-10-10 MED ORDER — PREDNISONE 10 MG PO TABS: ORAL_TABLET | ORAL | Status: DC

## 2011-10-10 NOTE — Telephone Encounter (Signed)
Send script for prednisone 10 mg pills: 3 pills for 2 days, 2 pills for 2 days, 1 pill for 2 days, then stop prednisone.  Dispense 12 with no refills.

## 2011-10-10 NOTE — Telephone Encounter (Signed)
Called and spoke with pt. Pt states she saw Dr. Craige Cotta on 10/08/11 and has been following Dr. Evlyn Courier recommendations but states cough is now worse.  States it is a dry cough and she now has tightness in chest.  Requesting VS' recs. Please advise.  Thanks.

## 2011-10-10 NOTE — Telephone Encounter (Signed)
Called and spoke with pt.  Pt aware of VS's recs and rx sent to pharmacy.

## 2011-10-14 ENCOUNTER — Ambulatory Visit: Payer: Medicare Other | Admitting: Licensed Clinical Social Worker

## 2011-10-15 ENCOUNTER — Institutional Professional Consult (permissible substitution): Payer: Medicare Other | Admitting: Internal Medicine

## 2011-10-15 ENCOUNTER — Telehealth: Payer: Self-pay | Admitting: Pulmonary Disease

## 2011-10-15 NOTE — Telephone Encounter (Signed)
Pt states her cough has not improved after taking Prednisone taper. She still has congestion or rattle in her chest, voice is hoarse from the cough and she is still wheezing. Pt will see TP on Wed., 12/19 @ 9:45am. She will seek emergency help if her symptoms get worse before appt.

## 2011-10-15 NOTE — Telephone Encounter (Signed)
Unable to reach pt at mobile left and left a msg on home number asking pt to return triage call.

## 2011-10-16 ENCOUNTER — Encounter: Payer: Self-pay | Admitting: Adult Health

## 2011-10-16 ENCOUNTER — Ambulatory Visit (INDEPENDENT_AMBULATORY_CARE_PROVIDER_SITE_OTHER): Payer: Medicare Other | Admitting: Adult Health

## 2011-10-16 VITALS — BP 138/72 | HR 88 | Temp 97.8°F | Ht 60.0 in | Wt 173.4 lb

## 2011-10-16 DIAGNOSIS — R05 Cough: Secondary | ICD-10-CM

## 2011-10-16 DIAGNOSIS — R059 Cough, unspecified: Secondary | ICD-10-CM

## 2011-10-16 DIAGNOSIS — R053 Chronic cough: Secondary | ICD-10-CM

## 2011-10-16 MED ORDER — PREDNISONE 10 MG PO TABS: ORAL_TABLET | ORAL | Status: DC

## 2011-10-16 MED ORDER — LEVOFLOXACIN 500 MG PO TABS: 500.0000 mg | ORAL_TABLET | Freq: Every day | ORAL | Status: AC

## 2011-10-16 MED ORDER — HYDROCODONE-HOMATROPINE 5-1.5 MG/5ML PO SYRP: 5.0000 mL | ORAL_SOLUTION | Freq: Four times a day (QID) | ORAL | Status: AC | PRN

## 2011-10-16 NOTE — Patient Instructions (Signed)
Levaquin 500mg  daily for 10 days  Mucinex Twice daily  For congestion  Delysm 2 tsp Twice daily  For cough  Prednisone taper over next week  GOAL IS NO COUGHING - USE SUGARLESS CANDY - NO MINTS- TO AVOID COUGH OR THROAT CLEARING.  SIP ON WATER TO SOOTHE THROAT  Continue on saline nasal rinses.  Hold Fish Oil  May use Hydromet 1-2 tsp every 4-6 hr As needed  Cough - may make you sleep  Continue on Prilosec 40 mg daily  Add Pepcid 20mg  At bedtime   follow up with Dr. Craige Cotta in 1 month as planned with breathing test.  Please contact office for sooner follow up if symptoms do not improve or worsen or seek emergency care

## 2011-10-16 NOTE — Progress Notes (Signed)
Reviewed and agree with plan.

## 2011-10-16 NOTE — Assessment & Plan Note (Signed)
Slow to resolve bronchitis with chronic cough- sinusitis may be contributing to her cough  Will treat empirically, if not improving may need CT sinus to add to workup  She will need PFTs as planned on return  Plan;  Levaquin 500mg  daily for 10 days  Mucinex Twice daily  For congestion  Delysm 2 tsp Twice daily  For cough  Prednisone taper over next week  GOAL IS NO COUGHING - USE SUGARLESS CANDY - NO MINTS- TO AVOID COUGH OR THROAT CLEARING.  SIP ON WATER TO SOOTHE THROAT  Continue on saline nasal rinses.  Hold Fish Oil  May use Hydromet 1-2 tsp every 4-6 hr As needed  Cough - may make you sleep  Continue on Prilosec 40 mg daily  Add Pepcid 20mg  At bedtime   follow up with Dr. Craige Cotta in 1 month as planned with breathing test.  Please contact office for sooner follow up if symptoms do not improve or worsen or seek emergency care

## 2011-10-16 NOTE — Progress Notes (Signed)
Subjective:    Patient ID: Heidi Franklin, female    DOB: 27-Apr-1946, 65 y.o.   MRN: [id number]  HPI 65 yo female never smoker seen for initial pulmonary consult for  evaluation of cough on 10/08/11 .    10/08/11 Pulmonary consult She has noticed trouble with a cough for years.  She used to get bronchitis frequently as a child.  She was told she had allergies to dust mites and cockroaches.  She was on allergy shots before, but not recently.  She was never told she had asthma before.    She had a respiratory infection about 3 weeks ago, and her cough got worse.  She was started on advair, but is not sure this has helped.  She was prescribed spiriva, but did not fill this due to expense.  Her cough is usually dry, but she will occasionally bring up clear sputum.  She denies hemoptysis.  She gets sinus congestion, and feels drainage down the back of her throat.  She gets a tickle in her throat, and this causes her to cough.  She denies any difficulty with swallowing.  She does have reflux.  She use 40 mg omeprazole in the morning, but still feels her heartburn is a problem.  She has used flonase and zyrtec, but not on a regular basis.  These seem to help some. >>rec zyrtec, delysm , nettie pot, benadryl, flonase, set up for PFT next month   10/16/2011 Acute OV Returns for persistent cough and wheezing. Seen last week for consult for cough and wheezing.  She did not improve with symptomatic recommendation , called back with steroid taper sent on 12/4.  Returns today without much improvement. Took benadryl in addition to regular meds for 4 days without much help. Xray done 2 weeks ago showed no acute process. Previously used Advair without any help.  She currently using saline rinses Twice daily  , zyrtec daily , Flonase Twice daily .   Complains of severe coughing fits-that come and go. Does have some breakthrough reflux.  Has a lot of sinus congestion and drainage   No unusual hobbies, birds,  pets or recent travel. No recent abx.     Review of Systems Constitutional:   No  weight loss, night sweats,  Fevers, chills,  +fatigue, or  lassitude.  HEENT:   No headaches,  Difficulty swallowing,  Tooth/dental problems, or  Sore throat,                No sneezing, itching, ear ache,  +nasal congestion, post nasal drip,   CV:  No chest pain,  Orthopnea, PND, swelling in lower extremities, anasarca, dizziness, palpitations, syncope.   GI  No heartburn, indigestion, abdominal pain, nausea, vomiting, diarrhea, change in bowel habits, loss of appetite, bloody stools.   Resp:   No coughing up of blood.     No chest wall deformity  Skin: no rash or lesions.  GU: no dysuria, change in color of urine, no urgency or frequency.  No flank pain, no hematuria   MS:  No joint pain or swelling.  No decreased range of motion.  No back pain.  Psych:  No change in mood or affect. No depression or anxiety.  No memory loss.         Objective:   Physical Exam GEN: A/Ox3; pleasant , NAD, well nourished   HEENT:  [place]/AT,  EACs-clear, TMs-wnl, NOSE-clear drainage , THROAT-clear, no lesions, no postnasal drip or exudate noted. Max sinus tenderness  NECK:  Supple w/ fair ROM; no JVD; normal carotid impulses w/o bruits; no thyromegaly or nodules palpated; no lymphadenopathy.  RESP  Coarse BS with few exp wheezes, loud psuedowheeze on forced exp. no accessory muscle use, no dullness to percussion  CARD:  RRR, no m/r/g  , no peripheral edema, pulses intact, no cyanosis or clubbing.  GI:   Soft & nt; nml bowel sounds; no organomegaly or masses detected.  Musco: Warm bil, no deformities or joint swelling noted.   Neuro: alert, no focal deficits noted.    Skin: Warm, no lesions or rashes         Assessment & Plan:

## 2011-10-17 ENCOUNTER — Institutional Professional Consult (permissible substitution): Payer: Medicare Other | Admitting: Pulmonary Disease

## 2011-10-18 ENCOUNTER — Ambulatory Visit: Payer: Medicare Other | Admitting: Licensed Clinical Social Worker

## 2011-10-30 ENCOUNTER — Telehealth: Payer: Self-pay | Admitting: *Deleted

## 2011-10-30 DIAGNOSIS — I10 Essential (primary) hypertension: Secondary | ICD-10-CM

## 2011-10-30 DIAGNOSIS — M659 Synovitis and tenosynovitis, unspecified: Secondary | ICD-10-CM

## 2011-10-30 DIAGNOSIS — F411 Generalized anxiety disorder: Secondary | ICD-10-CM

## 2011-10-30 MED ORDER — FLUTICASONE PROPIONATE 50 MCG/ACT NA SUSP: 1.0000 | Freq: Two times a day (BID) | NASAL | Status: DC

## 2011-10-30 MED ORDER — AMLODIPINE BESYLATE 10 MG PO TABS: 10.0000 mg | ORAL_TABLET | Freq: Every day | ORAL | Status: DC

## 2011-10-30 MED ORDER — NAPROXEN 500 MG PO TABS: 500.0000 mg | ORAL_TABLET | Freq: Two times a day (BID) | ORAL | Status: DC

## 2011-10-30 MED ORDER — LOSARTAN POTASSIUM-HCTZ 100-25 MG PO TABS: 1.0000 | ORAL_TABLET | Freq: Every day | ORAL | Status: DC

## 2011-10-30 MED ORDER — POTASSIUM CHLORIDE CRYS ER 20 MEQ PO TBCR: 20.0000 meq | EXTENDED_RELEASE_TABLET | Freq: Every day | ORAL | Status: DC

## 2011-10-30 MED ORDER — SERTRALINE HCL 50 MG PO TABS: 50.0000 mg | ORAL_TABLET | Freq: Every day | ORAL | Status: DC

## 2011-10-30 MED ORDER — METFORMIN HCL 500 MG PO TABS: 500.0000 mg | ORAL_TABLET | Freq: Two times a day (BID) | ORAL | Status: DC

## 2011-10-30 MED ORDER — OMEPRAZOLE 40 MG PO CPDR: 40.0000 mg | DELAYED_RELEASE_CAPSULE | Freq: Every day | ORAL | Status: DC

## 2011-10-30 NOTE — Telephone Encounter (Signed)
Pt left msg needing new rx's sent [facility]. Called pt back need to clarify which med she is needing. All them except xanax & inhalers. Will still get at local pharmacy. Pt ID # [id number]....10/30/11@10 :25am/LMB  Printed rx's fax to [facility] @ [phone]...10/30/11@10 :55am/LMB

## 2011-11-04 ENCOUNTER — Ambulatory Visit (INDEPENDENT_AMBULATORY_CARE_PROVIDER_SITE_OTHER): Payer: Medicare Other | Admitting: Licensed Clinical Social Worker

## 2011-11-04 DIAGNOSIS — F411 Generalized anxiety disorder: Secondary | ICD-10-CM

## 2011-11-05 ENCOUNTER — Other Ambulatory Visit: Payer: Self-pay | Admitting: Internal Medicine

## 2011-11-05 ENCOUNTER — Telehealth: Payer: Self-pay | Admitting: Pulmonary Disease

## 2011-11-05 DIAGNOSIS — R059 Cough, unspecified: Secondary | ICD-10-CM

## 2011-11-05 DIAGNOSIS — R05 Cough: Secondary | ICD-10-CM

## 2011-11-05 NOTE — Telephone Encounter (Signed)
Pt states finished 2nd round of abx and prednisone. Was seen by TP 10/16/11 and feels some better, still c/o dry cough, with hoarseness. States never received Hydromet. I called [facility] and they advised pt has not had filled with them since 2011. Please advise if okay to send.

## 2011-11-05 NOTE — Telephone Encounter (Signed)
lmmotcb x1

## 2011-11-05 NOTE — Telephone Encounter (Signed)
According to my office note on 10/16/11 the rx was done ??

## 2011-11-05 NOTE — Telephone Encounter (Signed)
lmomtcb x1 

## 2011-11-05 NOTE — Telephone Encounter (Signed)
LMOM TCB x1.  Is the cough syrup the only medication that patient is requesting?  Is another abx needed?  OV?  Need more information.

## 2011-11-05 NOTE — Telephone Encounter (Signed)
PT RETURNED CALL . Heidi Franklin  °

## 2011-11-06 MED ORDER — HYDROCODONE-HOMATROPINE 5-1.5 MG/5ML PO SYRP: 5.0000 mL | ORAL_SOLUTION | Freq: Four times a day (QID) | ORAL | Status: DC | PRN

## 2011-11-06 NOTE — Telephone Encounter (Signed)
Will forward to Dr. Craige Cotta so he is aware

## 2011-11-06 NOTE — Telephone Encounter (Signed)
I don't mind refilling her hydromet one more time ( 4ounces, no fills),  But she really needs to see DR Craige Cotta ASAP to sort thru all of this since she is not getting better.  See if she can be worked in with [facility] sooner than her upcoming apptm.  I would like to get ct sinuses as noted by TP at her last evaluation since she is no better.  Order LIMITED ct maxillofacial/sinuses without contrast. .  R/o acute/chronic sinusitis.

## 2011-11-06 NOTE — Telephone Encounter (Signed)
Pt is scheduled to come in and have PFT done 11/07/11 at 12:00 and her apt is w/ [facility] on 11/12/11 at 10:30.

## 2011-11-06 NOTE — Telephone Encounter (Signed)
Pt returned triage's call.  Holly D Pryor ° °

## 2011-11-06 NOTE — Telephone Encounter (Signed)
I spoke with pt and notified of recs per [facility]. She verbalized understanding and denied any questions. Limited CT sinus ordered and script for hydromet was sent.  Mindy, [facility] does not have any openings sooner than her sched appt on 1/17- do you want to overbook? Please advise thanks

## 2011-11-06 NOTE — Telephone Encounter (Signed)
Spoke with pt. She states that she after acute visit with TP 12/19 she had started to improve some, and then about a wk later started to have cough again, same as before. She states that the cough is keeping her up at night. Cough mainly dry but sometimes able to produce a small amount of clear to yellow sputum. She states that she never received rx for hydromet- we had printed this and gave to her at ov. She wants to know if needs more abx and if she can have new rx for hydromet. Will forward to doc of the day since TP and VS both not in the office. Please advise, thanks! Allergies  Allergen Reactions  . Penicillins

## 2011-11-07 ENCOUNTER — Ambulatory Visit (INDEPENDENT_AMBULATORY_CARE_PROVIDER_SITE_OTHER): Payer: Medicare Other | Admitting: Pulmonary Disease

## 2011-11-07 DIAGNOSIS — R059 Cough, unspecified: Secondary | ICD-10-CM

## 2011-11-07 DIAGNOSIS — R053 Chronic cough: Secondary | ICD-10-CM

## 2011-11-07 DIAGNOSIS — R05 Cough: Secondary | ICD-10-CM

## 2011-11-07 LAB — PULMONARY FUNCTION TEST

## 2011-11-07 NOTE — Progress Notes (Signed)
PFT done today. 

## 2011-11-08 ENCOUNTER — Ambulatory Visit (INDEPENDENT_AMBULATORY_CARE_PROVIDER_SITE_OTHER)
Admission: RE | Admit: 2011-11-08 | Discharge: 2011-11-08 | Disposition: A | Discharge status: Home / Self Care | Payer: Medicare Other | Source: Ambulatory Visit | Attending: Pulmonary Disease | Admitting: Pulmonary Disease

## 2011-11-08 DIAGNOSIS — R059 Cough, unspecified: Secondary | ICD-10-CM | POA: Diagnosis not present

## 2011-11-08 DIAGNOSIS — J342 Deviated nasal septum: Secondary | ICD-10-CM | POA: Diagnosis not present

## 2011-11-08 DIAGNOSIS — R05 Cough: Secondary | ICD-10-CM | POA: Diagnosis not present

## 2011-11-08 DIAGNOSIS — J42 Unspecified chronic bronchitis: Secondary | ICD-10-CM | POA: Diagnosis not present

## 2011-11-12 ENCOUNTER — Encounter: Payer: Self-pay | Admitting: Pulmonary Disease

## 2011-11-12 ENCOUNTER — Ambulatory Visit (INDEPENDENT_AMBULATORY_CARE_PROVIDER_SITE_OTHER): Payer: Medicare Other | Admitting: Pulmonary Disease

## 2011-11-12 VITALS — BP 132/64 | HR 91 | Temp 98.5°F | Ht 60.0 in | Wt 176.2 lb

## 2011-11-12 DIAGNOSIS — R059 Cough, unspecified: Secondary | ICD-10-CM | POA: Diagnosis not present

## 2011-11-12 DIAGNOSIS — J309 Allergic rhinitis, unspecified: Secondary | ICD-10-CM

## 2011-11-12 DIAGNOSIS — R05 Cough: Secondary | ICD-10-CM

## 2011-11-12 DIAGNOSIS — R053 Chronic cough: Secondary | ICD-10-CM

## 2011-11-12 MED ORDER — AEROCHAMBER MV MISC: Status: DC

## 2011-11-12 MED ORDER — BECLOMETHASONE DIPROPIONATE 80 MCG/ACT IN AERS: 2.0000 | INHALATION_SPRAY | Freq: Two times a day (BID) | RESPIRATORY_TRACT | Status: DC

## 2011-11-12 MED ORDER — HYDROCODONE-HOMATROPINE 5-1.5 MG/5ML PO SYRP: 5.0000 mL | ORAL_SOLUTION | Freq: Four times a day (QID) | ORAL | Status: DC | PRN

## 2011-11-12 NOTE — Assessment & Plan Note (Signed)
From rhinitis with post-nasal drip, and asthma.  Will start Qvar and continue prn albuterol.  Have provided spacer device.  Will continue flonase.  Have refilled her hydromet.

## 2011-11-12 NOTE — Assessment & Plan Note (Addendum)
She is to continue flonase.  If her symptoms persist, she may need evaluation by ENT for nasal septal deviation.  She may also need additional allergy testing.

## 2011-11-12 NOTE — Progress Notes (Signed)
Chief Complaint  Patient presents with  . Follow up w/ PFT    Pt states her breathing has been "okay". Pt sytates her cough is getting better--taking hydromet and it helps. Pt c/o wheezing DENIES any chest tightness    History of Present Illness: Heidi Franklin is a 66 y.o. female with chronic cough.  She is here to review CT sinus and PFT.  Since her last visit she has been on prednisone twice.  This has helped some.  She was also given course of levaquin in December.  She felt her cough got worse w/o advair.  Her sinuses are doing better with flonase.  She still gets post nasal drip and tickle in her throat.  She has been using hydromet, and this helps some.  She has been sneezing more.  She has not been using albuterol, but wasn't sure when she could use this.  Past Medical History  Diagnosis Date  . BRONCHITIS, CHRONIC   . ALLERGIC RHINITIS   . Anxiety state, unspecified   . DEPRESSION   . DIABETES MELLITUS, TYPE II   . GERD   . HYPERLIPIDEMIA   . HYPERTENSION     Past Surgical History  Procedure Date  . Abdominal hysterectomy 1980  . Trigger finger release     Index finger    Allergies  Allergen Reactions  . Penicillins     Physical Exam:  Blood pressure 132/64, pulse 91, temperature 98.5 F (36.9 C), temperature source Oral, height 5' (1.524 m), weight 176 lb 3.2 oz (79.924 kg), SpO2 98.00%. Body mass index is 34.41 kg/(m^2). Wt Readings from Last 2 Encounters:  11/12/11 176 lb 3.2 oz (79.924 kg)  10/16/11 173 lb 6.4 oz (78.654 kg)   General - Obese  HEENT - septal deviation, clear nasal discharge, no sinus tenderness, no oral exudate, mild erythema posterior pharynx, no LAN, no thyromegaly, TM clear  Cardiac - s1s2 regular, no murmur  Chest - no wheeze/rales/dullness  Abdomen - soft, nontender  Extremities - no e/c/c  Neurologic - normal strength, CN intact  Skin - no rashes  Psychiatric - normal mood, behavior  PFT 11/06/10>>FEV1 1.79 (102%), FEV1% 82, TLC  4.18 (104%), DLCO 69%, positive BD.  Ct Maxillofacial Ltd Wo Cm  11/08/2011  *RADIOLOGY REPORT*  Clinical Data:  66 year old female with cough, chronic bronchitis.  CT LIMITED SINUSES WITHOUT CONTRAST  Technique:  Multidetector CT images of the paranasal sinuses were obtained in a single plane without contrast.  Comparison:  None.  Findings:  Negative visualized noncontrast brain parenchyma. Negative visualized orbit and face soft tissues.  Visualized mastoids and tympanic cavities are clear.  Visualized sphenoid sinuses are clear. Visualized ethmoid air cells are clear. The frontal sinuses are hypoplastic but clear. Maxillary sinuses are clear.  The one views are patent.  Rightward nasal septal deviation.  Nasal cavity mucosal thickening.  No acute osseous abnormality identified.  IMPRESSION: Negative paranasal sinuses.  Rightward nasal septal deviation and possible rhinitis.  Original Report Authenticated By: Harley Hallmark, M.D.    Assessment/Plan:  Outpatient Encounter Prescriptions as of 11/12/2011  Medication Sig Dispense Refill  . albuterol (PROVENTIL HFA) 108 (90 BASE) MCG/ACT inhaler Inhale 2 puffs into the lungs every 6 (six) hours as needed for wheezing or shortness of breath.  1 Inhaler  3  . ALPRAZolam (XANAX) 0.5 MG tablet Take 1 tablet (0.5 mg total) by mouth every 8 (eight) hours as needed.  30 tablet  2  . amLODipine (NORVASC) 10 MG tablet  Take 1 tablet (10 mg total) by mouth daily.  90 tablet  2  . aspirin 81 MG tablet Take 81 mg by mouth daily.      . fluticasone (FLONASE) 50 MCG/ACT nasal spray Place 1 spray into the nose 2 (two) times daily.  48 g  1  . glucose blood (ONE TOUCH ULTRA TEST) test strip Use as instructed  100 each  5  . HYDROcodone-homatropine (HYDROMET) 5-1.5 MG/5ML syrup Take 5 mLs by mouth every 6 (six) hours as needed for cough.  120 mL  0  . Lancets (ONETOUCH ULTRASOFT) lancets PRN  100 each  5  . loperamide (IMODIUM A-D) 2 MG tablet Take 2 mg by mouth as  needed.       Marland Kitchen losartan-hydrochlorothiazide (HYZAAR) 100-25 MG per tablet Take 1 tablet by mouth daily.  90 tablet  2  . metFORMIN (GLUCOPHAGE) 500 MG tablet take 1 tablet by mouth twice a day with food  60 tablet  5  . Multiple Vitamin (MULTIVITAMIN) tablet Take 1 tablet by mouth daily.        . mupirocin (BACTROBAN) 2 % ointment Apply 1 application topically 3 (three) times daily as needed.       . Omega-3 Fatty Acids (FISH OIL) 1000 MG CAPS Take 2 capsules by mouth daily.       Marland Kitchen omeprazole (PRILOSEC) 40 MG capsule Take 1 capsule (40 mg total) by mouth daily.  90 capsule  2  . potassium chloride SA (K-DUR,KLOR-CON) 20 MEQ tablet Take 1 tablet (20 mEq total) by mouth daily.  90 tablet  2  . sertraline (ZOLOFT) 50 MG tablet Take 1 tablet (50 mg total) by mouth daily.  90 tablet  2  . vitamin E 400 UNIT capsule Take 400 Units by mouth daily.        Marland Kitchen DISCONTD: aspirin 81 MG chewable tablet Chew 1 tablet (81 mg total) by mouth daily.  30 tablet  11  . DISCONTD: Fluticasone-Salmeterol (ADVAIR DISKUS) 250-50 MCG/DOSE AEPB Inhale 1 puff into the lungs 2 (two) times daily.  60 each  3  . DISCONTD: naproxen (NAPROSYN) 500 MG tablet Take 1 tablet (500 mg total) by mouth 2 (two) times daily with a meal.  180 tablet  2  . DISCONTD: predniSONE (DELTASONE) 10 MG tablet 4 tabs for 2 days, then 3 tabs for 2 days, 2 tabs for 2 days, then 1 tab for 2 days, then stop  20 tablet  0    [name] Pager:  [phone] 11/12/2011, 11:04 AM

## 2011-11-12 NOTE — Patient Instructions (Signed)
Qvar two puffs twice per day, and rinse mouth after each use Proventil two puffs as needed for cough, chest congestion, or wheezing Continue flonase two sprays each nostril daily Follow up in 2 months

## 2011-11-14 ENCOUNTER — Ambulatory Visit: Payer: Medicare Other | Admitting: Pulmonary Disease

## 2011-11-15 ENCOUNTER — Encounter: Payer: Self-pay | Admitting: Pulmonary Disease

## 2011-12-04 ENCOUNTER — Telehealth: Payer: Self-pay | Admitting: Pulmonary Disease

## 2011-12-04 NOTE — Telephone Encounter (Signed)
I spoke with pt and she states her cough is getting better but she still has occasional coughing spells. She states it comes and goes. She can go days w/o coughing then she can be coughing for an hr or 2 then it's gone. Pt states she is using her hydromet cough syrup PRN but also believes this may be due to PND she is having. I advised pt she can take chlorpheniramine OTC to see if it helps with that. Pt voiced her understanding and needed nothing further

## 2011-12-06 ENCOUNTER — Ambulatory Visit (INDEPENDENT_AMBULATORY_CARE_PROVIDER_SITE_OTHER): Payer: Medicare Other | Admitting: Adult Health

## 2011-12-06 ENCOUNTER — Encounter: Payer: Self-pay | Admitting: Adult Health

## 2011-12-06 DIAGNOSIS — R059 Cough, unspecified: Secondary | ICD-10-CM

## 2011-12-06 DIAGNOSIS — R053 Chronic cough: Secondary | ICD-10-CM

## 2011-12-06 DIAGNOSIS — R05 Cough: Secondary | ICD-10-CM

## 2011-12-06 MED ORDER — BENZONATATE 200 MG PO CAPS: 200.0000 mg | ORAL_CAPSULE | Freq: Three times a day (TID) | ORAL | Status: DC | PRN

## 2011-12-06 MED ORDER — HYDROCODONE-HOMATROPINE 5-1.5 MG/5ML PO SYRP: 5.0000 mL | ORAL_SOLUTION | Freq: Four times a day (QID) | ORAL | Status: DC | PRN

## 2011-12-06 NOTE — Patient Instructions (Addendum)
Zyrtec 10mg  in am . Begin Chlorpheneramine 4mg  2 At bedtime   Delysm 2 tsp Twice daily  Tessalon Three times a day  For cough  May use Hydromet 1-2 tsp every 4-6 hr As needed breakthrough Cough - may make you sleepy  GOAL IS NO COUGHING - USE SUGARLESS CANDY - NO MINTS- TO AVOID COUGH OR THROAT CLEARING.  SIP ON WATER TO SOOTHE THROAT  Continue on saline nasal rinses.  Hold Fish Oil   Continue on Prilosec 40 mg daily  Continue on Pepcid 20mg  At bedtime   follow up with Dr. Craige Cotta in 1 month as planned  Please contact office for sooner follow up if symptoms do not improve or worsen or seek emergency care

## 2011-12-06 NOTE — Progress Notes (Signed)
  Subjective:    Patient ID: Heidi Franklin, female    DOB: [birth year], 66 y.o.   MRN: [id number]  HPI 66 yo female never smoker seen for initial pulmonary consult for  evaluation of cough on 10/08/11 .   12/06/2011 Acute OV  Complains of persistent cough. Does not feel her cough is any better. Seen 2 weeks started on QVAR without any benefit. PFT 11/06/10>>FEV1 1.79 (102%), FEV1% 82, TLC 4.18 (104%), DLCO 69%, positive BD. CT sinus showed no sinus infection .  Says her cough waxes and wanes. Is severe at times with associated urine incontinence.  She is very frustrated with her cough. She uses hydromet occasionally . Does not use delsym.  Started back on fish oil. Does have post nasal drip and drainage.   Review of Systems Constitutional:   No  weight loss, night sweats,  Fevers, chills,  +fatigue, or  lassitude.  HEENT:   No headaches,  Difficulty swallowing,  Tooth/dental problems, or  Sore throat,                No sneezing, itching, ear ache,  +nasal congestion, post nasal drip,   CV:  No chest pain,  Orthopnea, PND, swelling in lower extremities, anasarca, dizziness, palpitations, syncope.   GI  No heartburn, indigestion, abdominal pain, nausea, vomiting, diarrhea, change in bowel habits, loss of appetite, bloody stools.   Resp:    No coughing up of blood.    No chest wall deformity  Skin: no rash or lesions.  GU: no dysuria, change in color of urine, no urgency or frequency.  No flank pain, no hematuria   MS:  No joint pain or swelling.  No decreased range of motion.  No back pain.  Psych:  No change in mood or affect. No depression or anxiety.  No memory loss.          Objective:   Physical Exam  GEN: A/Ox3; pleasant , NAD, well nourished   HEENT:  [place]/AT,  EACs-clear, TMs-wnl, NOSE-clear drainage , THROAT-clear, no lesions, no postnasal drip or exudate noted.   NECK:  Supple w/ fair ROM; no JVD; normal carotid impulses w/o bruits; no thyromegaly or nodules palpated; no  lymphadenopathy.  RESP  Coarse BS  w/o, wheezes/ rales/ or rhonchi.no accessory muscle use, no dullness to percussion  CARD:  RRR, no m/r/g  , no peripheral edema, pulses intact, no cyanosis or clubbing.  GI:   Soft & nt; nml bowel sounds; no organomegaly or masses detected.  Musco: Warm bil, no deformities or joint swelling noted.   Neuro: alert, no focal deficits noted.    Skin: Warm, no lesions or rashes        Assessment & Plan:

## 2011-12-06 NOTE — Assessment & Plan Note (Signed)
Recurrent cyclical cough -unresponsive to meds  cxr and CT sinus unremarkable.   PlaN  Zyrtec 10mg  in am . Begin Chlorpheneramine 4mg  2 At bedtime   Delysm 2 tsp Twice daily  Tessalon Three times a day  For cough  May use Hydromet 1-2 tsp every 4-6 hr As needed breakthrough Cough - may make you sleepy  GOAL IS NO COUGHING - USE SUGARLESS CANDY - NO MINTS- TO AVOID COUGH OR THROAT CLEARING.  SIP ON WATER TO SOOTHE THROAT  Continue on saline nasal rinses.  Hold Fish Oil   Continue on Prilosec 40 mg daily  Continue on Pepcid 20mg  At bedtime   follow up with Dr. Craige Cotta in 1 month as planned  Please contact office for sooner follow up if symptoms do not improve or worsen or seek emergency care

## 2011-12-10 NOTE — Progress Notes (Signed)
Reviewed and agree with assessment/plan. 

## 2011-12-12 ENCOUNTER — Ambulatory Visit (INDEPENDENT_AMBULATORY_CARE_PROVIDER_SITE_OTHER): Payer: Medicare Other | Admitting: Internal Medicine

## 2011-12-12 ENCOUNTER — Encounter: Payer: Self-pay | Admitting: Internal Medicine

## 2011-12-12 ENCOUNTER — Ambulatory Visit (INDEPENDENT_AMBULATORY_CARE_PROVIDER_SITE_OTHER)
Admission: RE | Admit: 2011-12-12 | Discharge: 2011-12-12 | Disposition: A | Discharge status: Home / Self Care | Payer: Medicare Other | Source: Ambulatory Visit | Attending: Internal Medicine | Admitting: Internal Medicine

## 2011-12-12 VITALS — BP 120/62 | HR 81 | Temp 98.4°F | Wt 172.8 lb

## 2011-12-12 DIAGNOSIS — M79672 Pain in left foot: Secondary | ICD-10-CM

## 2011-12-12 DIAGNOSIS — M659 Synovitis and tenosynovitis, unspecified: Secondary | ICD-10-CM | POA: Diagnosis not present

## 2011-12-12 DIAGNOSIS — M7750 Other enthesopathy of unspecified foot: Secondary | ICD-10-CM

## 2011-12-12 DIAGNOSIS — M79609 Pain in unspecified limb: Secondary | ICD-10-CM

## 2011-12-12 NOTE — Patient Instructions (Signed)
It was good to see you today. xray ordered today. Your results will be called to you after review (48-72hours after test completion). If any changes need to be made, you will be notified at that time. Naprosyn 2x/day x 1 week, then as needed Ice to tender area 3x/day, wear good shoe support and call if worse or unimporved with medications in next 7-10days

## 2011-12-12 NOTE — Progress Notes (Signed)
Subjective:    Patient ID: Heidi Franklin, female    DOB: [birth year], 66 y.o.   MRN: [id number]  Foot Pain This is a new problem. The current episode started in the past 7 days. The problem occurs intermittently. The problem has been gradually improving. Associated symptoms include coughing. Pertinent negatives include no arthralgias, chest pain, fever, joint swelling, myalgias or rash. The symptoms are aggravated by standing and walking. She has tried NSAIDs and heat for the symptoms. The treatment provided mild relief.    Also reviewed chronic med issues today:  HTN - reports compliance with ongoing medical treatment and no changes in medication dose or frequency. denies adverse side effects related to current therapy.   dyslipidemia hx - declines statin, but LDL <100 without meds despite being diabetes mellitus   DM2 - reports compliance with ongoing medical treatment and no changes in medication dose or frequency. denies adverse side effects related to current therapy.  home cbgs 120s-150, checks sugars few times each week - no signs or symptoms hypoglycemia  chronic bronchitis and allergies -  Daily dry cough, no mucus or fever, no sore throat but mild hoarseness with nasal drainage - no smoking hx -   GERD - omep with improvement, but intermittent use. Denies adverse side effects.  anxiety - takes BZ prn - started sertraline summer 2012   Trigger finger s/p cortisone injection 05/31/11 - less popping in L 4th finger but still stiffness  Past Medical History  Diagnosis Date  . BRONCHITIS, CHRONIC   . ALLERGIC RHINITIS   . Anxiety state, unspecified   . DEPRESSION   . DIABETES MELLITUS, TYPE II   . GERD   . HYPERLIPIDEMIA   . HYPERTENSION     Review of Systems  Constitutional: Negative for fever and unexpected weight change.  Respiratory: Positive for cough. Negative for shortness of breath.   Cardiovascular: Negative for chest pain and palpitations.  Musculoskeletal: Negative  for myalgias, back pain, joint swelling and arthralgias.  Skin: Negative for rash.       Objective:   Physical Exam  BP 120/62  Pulse 81  Temp(Src) 98.4 F (36.9 C) (Oral)  Wt 172 lb 12.8 oz (78.382 kg)  SpO2 98% Wt Readings from Last 3 Encounters:  12/12/11 172 lb 12.8 oz (78.382 kg)  12/06/11 177 lb 6.4 oz (80.468 kg)  11/12/11 176 lb 3.2 oz (79.924 kg)   Constitutional: She appears well-developed and well-nourished. No distress.  Neck: Normal range of motion. Neck supple. No JVD present. No thyromegaly present.  Cardiovascular: Normal rate, regular rhythm and normal heart sounds.  No murmur heard. No BLE edema. Pulmonary/Chest: Effort normal and breath sounds normal. No respiratory distress. She has no wheezes.  MSkel - L foot/ankle - no gross deformity, no swelling or edema. Ligamentous function intact. Full range of motion. Pain lateral arch with weight bearing but otherwise not reproducible pain with palpation Psychiatric: She has an anxious mood and affect. Her behavior is normal. Judgment and thought content normal.   Lab Results  Component Value Date   WBC 9.1 08/20/2011   HGB 12.3 08/20/2011   HCT 36.3 08/20/2011   PLT 242.0 08/20/2011   CHOL 155 03/13/2011   TRIG 164.0* 03/13/2011   HDL 32.80* 03/13/2011   ALT 38* 03/02/2010   AST 41* 03/02/2010   NA 142 03/02/2010   K 3.8 03/02/2010   CL 105 03/02/2010   CREATININE 0.9 03/13/2011   BUN 14 03/02/2010   CO2 28 03/02/2010  TSH 3.19 08/20/2011   HGBA1C 6.8* 10/02/2011   MICROALBUR 0.7 03/02/2010   Lab Results  Component Value Date   LDLCALC 89 03/13/2011        Assessment & Plan:   L foot pain - lateral arch area - no evidence for foreign body or fracture, no history of for trauma - suspected strain or tendonitis - improved slightly with NSAIDs x 24h - check xray as pt is diabetic, rx naprosyn 500 bid x 7d, arch supportive shoes and ice tid x 3-7 days

## 2011-12-30 ENCOUNTER — Ambulatory Visit (INDEPENDENT_AMBULATORY_CARE_PROVIDER_SITE_OTHER): Payer: Medicare Other | Admitting: Licensed Clinical Social Worker

## 2011-12-30 DIAGNOSIS — F411 Generalized anxiety disorder: Secondary | ICD-10-CM | POA: Diagnosis not present

## 2012-01-13 ENCOUNTER — Other Ambulatory Visit: Payer: Medicare Other

## 2012-01-13 ENCOUNTER — Ambulatory Visit (INDEPENDENT_AMBULATORY_CARE_PROVIDER_SITE_OTHER): Payer: Medicare Other | Admitting: Pulmonary Disease

## 2012-01-13 ENCOUNTER — Encounter: Payer: Self-pay | Admitting: Internal Medicine

## 2012-01-13 ENCOUNTER — Ambulatory Visit (INDEPENDENT_AMBULATORY_CARE_PROVIDER_SITE_OTHER): Payer: Medicare Other | Admitting: Internal Medicine

## 2012-01-13 ENCOUNTER — Encounter: Payer: Self-pay | Admitting: Pulmonary Disease

## 2012-01-13 ENCOUNTER — Telehealth: Payer: Self-pay | Admitting: *Deleted

## 2012-01-13 VITALS — BP 126/80 | HR 88 | Temp 97.8°F | Ht 60.0 in | Wt 179.0 lb

## 2012-01-13 VITALS — BP 134/70 | HR 91 | Temp 97.9°F | Ht 62.0 in | Wt 177.8 lb

## 2012-01-13 DIAGNOSIS — J309 Allergic rhinitis, unspecified: Secondary | ICD-10-CM

## 2012-01-13 DIAGNOSIS — M79609 Pain in unspecified limb: Secondary | ICD-10-CM | POA: Diagnosis not present

## 2012-01-13 DIAGNOSIS — I10 Essential (primary) hypertension: Secondary | ICD-10-CM

## 2012-01-13 DIAGNOSIS — E119 Type 2 diabetes mellitus without complications: Secondary | ICD-10-CM

## 2012-01-13 DIAGNOSIS — R05 Cough: Secondary | ICD-10-CM | POA: Diagnosis not present

## 2012-01-13 DIAGNOSIS — M79672 Pain in left foot: Secondary | ICD-10-CM

## 2012-01-13 DIAGNOSIS — E785 Hyperlipidemia, unspecified: Secondary | ICD-10-CM | POA: Diagnosis not present

## 2012-01-13 DIAGNOSIS — R059 Cough, unspecified: Secondary | ICD-10-CM | POA: Diagnosis not present

## 2012-01-13 DIAGNOSIS — R053 Chronic cough: Secondary | ICD-10-CM

## 2012-01-13 MED ORDER — MONTELUKAST SODIUM 10 MG PO TABS: 10.0000 mg | ORAL_TABLET | Freq: Every day | ORAL | Status: DC

## 2012-01-13 NOTE — Progress Notes (Signed)
Chief Complaint  Patient presents with  . Follow-up    Pt states dry cough is still present but better...occasional sob and wheezing and sneezing.    History of Present Illness: Heidi Franklin is a 66 y.o. female with chronic cough with asthma and post-nasal drip with nasal septal deviation.  Her cough persists.  She feels some improvement, but still has sinus congestion with post-nasal drip.  She also has a globus sensation.  Her cough is worse when she lays down.  She feels drainage from her sinuses.  She uses her albuterol 3 or 4 times per week when she gets wheezing, and this helps.  She feels flonase has helped.  She is not using nasal irrigation at present.  She still has some reflux, and this is being monitor by primary care.   Past Medical History  Diagnosis Date  . BRONCHITIS, CHRONIC   . ALLERGIC RHINITIS   . Anxiety state, unspecified   . DEPRESSION   . DIABETES MELLITUS, TYPE II   . GERD   . HYPERLIPIDEMIA   . HYPERTENSION     Past Surgical History  Procedure Date  . Abdominal hysterectomy 1980  . Trigger finger release     Index finger    Allergies  Allergen Reactions  . Penicillins     Physical Exam:  Blood pressure 126/80, pulse 88, temperature 97.8 F (36.6 C), temperature source Oral, height 5' (1.524 m), weight 179 lb (81.194 kg), SpO2 98.00%. Body mass index is 34.96 kg/(m^2). Wt Readings from Last 2 Encounters:  01/13/12 179 lb (81.194 kg)  12/12/11 172 lb 12.8 oz (78.382 kg)   General - Obese  HEENT - septal deviation, clear nasal discharge, no sinus tenderness, no oral exudate, mild erythema posterior pharynx, no LAN, no thyromegaly, TM clear  Cardiac - s1s2 regular, no murmur  Chest - no wheeze/rales/dullness  Abdomen - soft, nontender  Extremities - no e/c/c  Neurologic - normal strength, CN intact  Skin - no rashes  Psychiatric - normal mood, behavior   Assessment/Plan:  Outpatient Encounter Prescriptions as of 01/13/2012  Medication Sig  Dispense Refill  . albuterol (PROVENTIL HFA) 108 (90 BASE) MCG/ACT inhaler Inhale 2 puffs into the lungs every 6 (six) hours as needed for wheezing or shortness of breath.  1 Inhaler  3  . ALPRAZolam (XANAX) 0.5 MG tablet Take 1 tablet (0.5 mg total) by mouth every 8 (eight) hours as needed.  30 tablet  2  . amLODipine (NORVASC) 10 MG tablet Take 1 tablet (10 mg total) by mouth daily.  90 tablet  2  . aspirin 81 MG tablet Take 81 mg by mouth daily.      . beclomethasone (QVAR) 80 MCG/ACT inhaler Inhale 2 puffs into the lungs 2 (two) times daily.  1 Inhaler  5  . fluticasone (FLONASE) 50 MCG/ACT nasal spray Place 1 spray into the nose 2 (two) times daily.  48 g  1  . glucose blood (ONE TOUCH ULTRA TEST) test strip Use as instructed  100 each  5  . HYDROcodone-homatropine (HYDROMET) 5-1.5 MG/5ML syrup Take 5 mLs by mouth every 6 (six) hours as needed for cough.  240 mL  0  . Lancets (ONETOUCH ULTRASOFT) lancets PRN  100 each  5  . loperamide (IMODIUM A-D) 2 MG tablet Take 2 mg by mouth as needed.       Marland Kitchen losartan-hydrochlorothiazide (HYZAAR) 100-25 MG per tablet Take 1 tablet by mouth daily.  90 tablet  2  .  metFORMIN (GLUCOPHAGE) 500 MG tablet take 1 tablet by mouth twice a day with food  60 tablet  5  . Multiple Vitamin (MULTIVITAMIN) tablet Take 1 tablet by mouth daily.        . mupirocin (BACTROBAN) 2 % ointment Apply 1 application topically 3 (three) times daily as needed.       . naproxen (NAPROSYN) 500 MG tablet Take 1 tablet (500 mg total) by mouth 2 (two) times daily with a meal.  30 tablet  0  . omeprazole (PRILOSEC) 40 MG capsule Take 1 capsule (40 mg total) by mouth daily.  90 capsule  2  . potassium chloride SA (K-DUR,KLOR-CON) 20 MEQ tablet Take 1 tablet (20 mEq total) by mouth daily.  90 tablet  2  . sertraline (ZOLOFT) 50 MG tablet Take 1 tablet (50 mg total) by mouth daily.  90 tablet  2  . Spacer/Aero-Holding Chambers (AEROCHAMBER MV) inhaler Use as instructed  1 each  0  .  vitamin E 400 UNIT capsule Take 400 Units by mouth daily.        . Omega-3 Fatty Acids (FISH OIL) 1000 MG CAPS Take 2 capsules by mouth daily.       Marland Kitchen DISCONTD: benzonatate (TESSALON) 200 MG capsule Take 1 capsule (200 mg total) by mouth 3 (three) times daily as needed for cough.  30 capsule  1    Heidi Franklin Pager:  [phone] 01/13/2012, 2:00 PM

## 2012-01-13 NOTE — Assessment & Plan Note (Signed)
suboptimal control - increased amlodipine dose 02/2011 - cont max ARB/hctz Work on lifestyle control and weight loss BP Readings from Last 3 Encounters:  01/13/12 134/70  01/13/12 126/80  12/12/11 120/62

## 2012-01-13 NOTE — Progress Notes (Signed)
Addended by: Michel Bickers A on: 01/13/2012 02:40 PM   Modules accepted: Orders

## 2012-01-13 NOTE — Patient Instructions (Signed)
It was good to see you today. Test(s) ordered today. Your results will be called to you after review (48-72hours after test completion). If any changes need to be made, you will be notified at that time. medications reviewed, no changes at this time. we'll make referral to orthopedics for your foot pain . Our office will contact you regarding appointment(s) once made. continue good shoe support, naprosyn and ice until then Please schedule followup in 4 months for review of diabetes mellitus and blood pressure, call sooner if problems.

## 2012-01-13 NOTE — Progress Notes (Signed)
Subjective:    Patient ID: Heidi Franklin, female    DOB: [birth year], 66 y.o.   MRN: [id number]  HPI  Here for follow up - reviewed chronic med issues today:  HTN - reports compliance with ongoing medical treatment and no changes in medication dose or frequency. denies adverse side effects related to current therapy.   dyslipidemia - declines statin - but LDL <100 without tx despite being DM  DM2 - reports compliance with ongoing medical treatment and no changes in medication dose or frequency. denies adverse side effects related to current therapy.  home cbgs 130-140, checks sugars few times each week - no signs or symptoms hypoglycemia  chronic bronchitis and allergies, working with pulm for same - cough remains daily symptom: dry - no mucus or fever or ST, chronic mild hoarseness from nasal drainage - no smoking hx - no COPD or asthma  GERD symptoms - on omeprazole qd with improvement, but intermittent use. Denies adverse side effects.  Anxiety - takes BZ prn and started sertraline summer 2012 - The current medical regimen is effective;  continue present plan and medications.   Trigger finger s/p cortisone injection 05/31/11 - less popping in L 4th finger but still stiffness  Past Medical History  Diagnosis Date  . BRONCHITIS, CHRONIC   . ALLERGIC RHINITIS   . Anxiety state, unspecified   . DEPRESSION   . DIABETES MELLITUS, TYPE II   . GERD   . HYPERLIPIDEMIA   . HYPERTENSION     Review of Systems  Constitutional: Positive for fatigue. Negative for fever and unexpected weight change.  Respiratory: Positive for cough and chest tightness. Negative for shortness of breath.   Cardiovascular: Negative for chest pain and palpitations.  Musculoskeletal: Negative for back pain.       Objective:   Physical Exam  BP 134/70  Pulse 91  Temp(Src) 97.9 F (36.6 C) (Oral)  Ht 5\' 2"  (1.575 m)  Wt 177 lb 12.8 oz (80.65 kg)  BMI 32.52 kg/m2  SpO2 97% Wt Readings from Last 3  Encounters:  01/13/12 177 lb 12.8 oz (80.65 kg)  01/13/12 179 lb (81.194 kg)  12/12/11 172 lb 12.8 oz (78.382 kg)   Constitutional: She is overweight, but appears well-developed and well-nourished. No distress.  Neck: Normal range of motion. Neck supple. No JVD present. No thyromegaly present.  Cardiovascular: Normal rate, regular rhythm and normal heart sounds.  No murmur heard. No BLE edema. Pulmonary/Chest: Effort normal and breath sounds normal. No respiratory distress. She has no wheezes.  MSkel: L foot without swelling or erythema - no deformity and FROM without pain Psychiatric: She has a mildly anxious mood and affect. Her behavior is normal. Judgment and thought content normal.   Lab Results  Component Value Date   WBC 9.1 08/20/2011   HGB 12.3 08/20/2011   HCT 36.3 08/20/2011   PLT 242.0 08/20/2011   GLUCOSE 103* 03/02/2010   CHOL 155 03/13/2011   TRIG 164.0* 03/13/2011   HDL 32.80* 03/13/2011   LDLCALC 89 03/13/2011   ALT 38* 03/02/2010   AST 41* 03/02/2010   NA 142 03/02/2010   K 3.8 03/02/2010   CL 105 03/02/2010   CREATININE 0.9 03/13/2011   BUN 14 03/02/2010   CO2 28 03/02/2010   TSH 3.19 08/20/2011   HGBA1C 6.8* 10/02/2011   MICROALBUR 0.7 03/02/2010   Dg Foot 2 Views Left  12/12/2011  *RADIOLOGY REPORT*  Clinical Data: 66 year old female with left lateral foot pain near the ankle.  LEFT FOOT - 2 VIEW  Comparison: None.  Findings: Bone mineralization is within normal limits.  Calcaneus intact with plantar degenerative spurring.  Joint spaces appear within normal limits.  No acute fracture or dislocation is identified.  There is an area of heterotopic ossification or spurring along the inferolateral cuboid seen on both views.  IMPRESSION: No acute fracture or dislocation identified.  Heterotopic ossification along the inferolateral cuboid.  Is this in the area of clinical pain?  It might be degenerative or post traumatic. If the pain is more referable to the ankle, dedicated ankle  radiographs may be valuable.  Original Report Authenticated By: Harley Hallmark, M.D.      Assessment & Plan:  See problem list. Medications and labs reviewed today.  persisting L foot pain - ongoing >6 weeks, exac by prolonged WB and standing - no trauma, unimproved with conservative care NSAIDs - refer to ortho for eval/tx as needed

## 2012-01-13 NOTE — Assessment & Plan Note (Signed)
She has some improvement, but still has persistent cough.  Will continue flonase and Qvar with prn albuterol.  Advised her to resume nasal irrigation.  She is to try salt water gargles.  Will check RAST with IgE.  Will add singulair to her regimen.

## 2012-01-13 NOTE — Assessment & Plan Note (Signed)
If her symptoms persist, then she may need evaluation by ENT for septal deviation.

## 2012-01-13 NOTE — Assessment & Plan Note (Signed)
Prev rx'd statins including simva and prava but pt afraid of potential side effects, noncompliance with prior rx due to same  -  02/2011 lipids with LDL<100  - Continue to watch off statin, but importance of good lipid control reviewed again with pt, esp given co-dz with diabetes mellitus + hypertension

## 2012-01-13 NOTE — Patient Instructions (Signed)
Montelukast (singulair) 10 mg one pill at night Qvar two puffs twice per day Albuterol two puffs as needed for cough, wheeze, or chest congestion Nasal irrigation (salt water saline rinse for sinuses) once or twice per day as needed Flonase two sprays each nostril daily Salt water gargles once or twice per day as needed Lab tests today>>will call with results Follow up in 6 to 8 weeks

## 2012-01-13 NOTE — Assessment & Plan Note (Signed)
On metformin; also ARB, ASA 81 - declines statin reports home cbgs fair Check a1c now, adjust if needed Lab Results  Component Value Date   HGBA1C 6.8* 10/02/2011   Lab Results  Component Value Date   CREATININE 0.9 03/13/2011

## 2012-01-13 NOTE — Telephone Encounter (Signed)
Pt call left msg have appt this afternoon @ 2:45pm. Want to come in prior to do blood work. Called pt back not actually do for lipid check until May, md may check TSH & A1C & you don't have to be fasting for those test... 01/13/12@9 :36am/LMB

## 2012-01-14 ENCOUNTER — Other Ambulatory Visit (INDEPENDENT_AMBULATORY_CARE_PROVIDER_SITE_OTHER): Payer: Medicare Other

## 2012-01-14 DIAGNOSIS — E119 Type 2 diabetes mellitus without complications: Secondary | ICD-10-CM | POA: Diagnosis not present

## 2012-01-14 DIAGNOSIS — E785 Hyperlipidemia, unspecified: Secondary | ICD-10-CM

## 2012-01-14 LAB — HEMOGLOBIN A1C: Hgb A1c MFr Bld: 6.8 % — ABNORMAL HIGH (ref 4.6–6.5)

## 2012-01-14 LAB — LIPID PANEL
Cholesterol: 197 mg/dL (ref 0–200)
HDL: 41.2 mg/dL (ref >39.00)
LDL Cholesterol: 124 mg/dL — ABNORMAL HIGH (ref 0–99)
Total CHOL/HDL Ratio: 5
Triglycerides: 157 mg/dL — ABNORMAL HIGH (ref 0.0–149.0)
VLDL: 31.4 mg/dL (ref 0.0–40.0)

## 2012-01-15 ENCOUNTER — Telehealth: Payer: Self-pay | Admitting: Pulmonary Disease

## 2012-01-15 DIAGNOSIS — J309 Allergic rhinitis, unspecified: Secondary | ICD-10-CM

## 2012-01-15 LAB — ALLERGY FULL PROFILE
Allergen, D pternoyssinus,d7: <0.1 kU/L (ref <0.35)
Allergen,Goose feathers, e70: <0.1 kU/L (ref <0.35)
Alternaria Alternata: <0.1 kU/L (ref <0.35)
Aspergillus fumigatus, IgG: <0.1 kU/L (ref <0.35)
Bahia Grass: <0.1 kU/L (ref <0.35)
Bermuda Grass: <0.1 kU/L (ref <0.35)
Box Elder IgE: <0.1 kU/L (ref <0.35)
Candida Albicans: <0.1 kU/L (ref <0.35)
Cat Dander: <0.1 kU/L (ref <0.35)
Common Ragweed: <0.1 kU/L (ref <0.35)
Curvularia lunata: <0.1 kU/L (ref <0.35)
D. farinae: <0.1 kU/L (ref <0.35)
Dog Dander: <0.1 kU/L (ref <0.35)
Elm IgE: <0.1 kU/L (ref <0.35)
Fescue: <0.1 kU/L (ref <0.35)
G005 Rye, Perennial: <0.1 kU/L (ref <0.35)
G009 Red Top: <0.1 kU/L (ref <0.35)
Goldenrod: <0.1 kU/L (ref <0.35)
Helminthosporium halodes: <0.1 kU/L (ref <0.35)
House Dust Hollister: <0.1 kU/L (ref <0.35)
IgE (Immunoglobulin E), Serum: 22.7 IU/mL (ref 0.0–180.0)
Lamb's Quarters: <0.1 kU/L (ref <0.35)
Oak: <0.1 kU/L (ref <0.35)
Plantain: <0.1 kU/L (ref <0.35)
Stemphylium Botryosum: <0.1 kU/L (ref <0.35)
Sycamore Tree: <0.1 kU/L (ref <0.35)
Timothy Grass: <0.1 kU/L (ref <0.35)

## 2012-01-15 NOTE — Telephone Encounter (Signed)
Will have my nurse inform patient that allergy tests were normal.  No change to current treatment plan.

## 2012-01-16 DIAGNOSIS — M6530 Trigger finger, unspecified finger: Secondary | ICD-10-CM | POA: Diagnosis not present

## 2012-01-16 DIAGNOSIS — M8430XA Stress fracture, unspecified site, initial encounter for fracture: Secondary | ICD-10-CM | POA: Diagnosis not present

## 2012-01-16 DIAGNOSIS — S92213A Displaced fracture of cuboid bone of unspecified foot, initial encounter for closed fracture: Secondary | ICD-10-CM | POA: Diagnosis not present

## 2012-01-20 NOTE — Telephone Encounter (Signed)
I spoke with patient about results and she verbalized understanding and had no questions 

## 2012-01-22 ENCOUNTER — Telehealth: Payer: Self-pay | Admitting: Pulmonary Disease

## 2012-01-22 DIAGNOSIS — R05 Cough: Secondary | ICD-10-CM

## 2012-01-22 DIAGNOSIS — R059 Cough, unspecified: Secondary | ICD-10-CM

## 2012-01-22 MED ORDER — PREDNISONE 10 MG PO TABS: ORAL_TABLET | ORAL | Status: AC

## 2012-01-22 NOTE — Telephone Encounter (Signed)
I spoke with pt and she is aware and had no questions. Pt will call back in a few days if she is not improving

## 2012-01-22 NOTE — Telephone Encounter (Signed)
Called, spoke with pt.  She c/o "deep drainage" in throat this week and coughing.  Cough is mostly dry but at times is prod with a small amount of clear mucus.  Reports she does have SOB after coughing and some wheezing and chest tightness.  States she is doing all the recs from Dr. Craige Cotta from OV on 3/18 but not helping.  She is also taking chlorpheniramine 2 tablets qhs.  I offered OV with Dr. Craige Cotta for tomorrow.  Pt declined -- requesting further recs or something to be sent in.  Dr. Craige Cotta, pls advise.  Thank you.  [facility] [place]  Allergies verified  Allergies  Allergen Reactions  . Penicillins

## 2012-01-22 NOTE — Telephone Encounter (Signed)
lmomtcb x1 for pt to make aware 

## 2012-01-22 NOTE — Telephone Encounter (Signed)
Please inform patient that I have sent script for prednisone.  She should call back in few days if no improvement.

## 2012-02-12 DIAGNOSIS — S92213A Displaced fracture of cuboid bone of unspecified foot, initial encounter for closed fracture: Secondary | ICD-10-CM | POA: Diagnosis not present

## 2012-02-12 DIAGNOSIS — IMO0001 Reserved for inherently not codable concepts without codable children: Secondary | ICD-10-CM | POA: Diagnosis not present

## 2012-02-25 ENCOUNTER — Other Ambulatory Visit: Payer: Self-pay | Admitting: Internal Medicine

## 2012-02-26 DIAGNOSIS — M62838 Other muscle spasm: Secondary | ICD-10-CM | POA: Diagnosis not present

## 2012-02-26 DIAGNOSIS — M25549 Pain in joints of unspecified hand: Secondary | ICD-10-CM | POA: Diagnosis not present

## 2012-02-26 DIAGNOSIS — M6530 Trigger finger, unspecified finger: Secondary | ICD-10-CM | POA: Diagnosis not present

## 2012-02-26 NOTE — Telephone Encounter (Signed)
F[facility]ed [facility] to [facility]... 02/26/12@1 :21pm/LMB

## 2012-03-09 ENCOUNTER — Ambulatory Visit: Payer: Medicare Other | Admitting: Pulmonary Disease

## 2012-03-27 ENCOUNTER — Telehealth: Payer: Self-pay | Admitting: *Deleted

## 2012-03-27 MED ORDER — ZOSTER VACCINE LIVE 19400 UNT/0.65ML ~~LOC~~ SOLR: 0.6500 mL | Freq: Once | SUBCUTANEOUS | Status: DC

## 2012-03-27 NOTE — Telephone Encounter (Signed)
Received fax stating pt has expressed interest in the zostavax vacc. Requesting rx to be sent so we may check pricing for the patient... 03/27/12@9 :11am/LMB

## 2012-03-27 NOTE — Telephone Encounter (Signed)
Sent rx electronically... 03/27/12@1 :36pm/LMB

## 2012-03-27 NOTE — Telephone Encounter (Signed)
Ok - write or print and fax as needed

## 2012-04-13 ENCOUNTER — Encounter: Payer: Self-pay | Admitting: Pulmonary Disease

## 2012-04-13 ENCOUNTER — Ambulatory Visit (INDEPENDENT_AMBULATORY_CARE_PROVIDER_SITE_OTHER): Payer: Medicare Other | Admitting: Pulmonary Disease

## 2012-04-13 VITALS — BP 118/82 | HR 92 | Temp 98.2°F | Ht 60.0 in | Wt 174.8 lb

## 2012-04-13 DIAGNOSIS — R053 Chronic cough: Secondary | ICD-10-CM

## 2012-04-13 DIAGNOSIS — R05 Cough: Secondary | ICD-10-CM

## 2012-04-13 DIAGNOSIS — J310 Chronic rhinitis: Secondary | ICD-10-CM

## 2012-04-13 DIAGNOSIS — K219 Gastro-esophageal reflux disease without esophagitis: Secondary | ICD-10-CM

## 2012-04-13 DIAGNOSIS — R059 Cough, unspecified: Secondary | ICD-10-CM | POA: Diagnosis not present

## 2012-04-13 NOTE — Patient Instructions (Signed)
Will arrange for evaluation by ENT Follow up in 3 months

## 2012-04-13 NOTE — Assessment & Plan Note (Signed)
I am concerned her persistent sinus symptoms and post-nasal drip are contributing to her cough.  He does have nasal septal deviation on CT sinus.  Will refer to ENT to further assess.

## 2012-04-13 NOTE — Assessment & Plan Note (Signed)
She is to continue PPI through her PCP.  This may be contributing to her upper airway irritation and cough.

## 2012-04-13 NOTE — Assessment & Plan Note (Signed)
Likely from post-nasal drip and asthma.  She is to continue her current regimen pending ENT evaluation.

## 2012-04-13 NOTE — Progress Notes (Signed)
Chief Complaint  Patient presents with  . Follow-up    cough is about 50% getting better. c/o hoarseness from the cough and occaisonal wheezing    History of Present Illness: Heidi Franklin is a 66 y.o. female with chronic cough with asthma, post-nasal drip with nasal septal deviation, and GERD.  She still has dry cough.  She felt better after course of prednisone in March. She still has sinus congestion and post-nasal drip, especially when she lays flat.  She gets sinus headaches around her eyes.  She denies dental pain, fever, or sputum.  She feels most of her wheeze comes from her throat, but also has some from her chest.  She uses albuterol once per week, and this helps.   Past Medical History  Diagnosis Date  . BRONCHITIS, CHRONIC   . ALLERGIC RHINITIS   . Anxiety state, unspecified   . DEPRESSION   . DIABETES MELLITUS, TYPE II   . GERD   . HYPERLIPIDEMIA   . HYPERTENSION     Past Surgical History  Procedure Date  . Abdominal hysterectomy 1980  . Trigger finger release     Index finger    Allergies  Allergen Reactions  . Penicillins     Physical Exam:  Blood pressure 118/82, pulse 92, temperature 98.2 F (36.8 C), temperature source Oral, height 5' (1.524 m), weight 174 lb 12.8 oz (79.289 kg), SpO2 100.00%.  Body mass index is 34.14 kg/(m^2).  Wt Readings from Last 2 Encounters:  04/13/12 174 lb 12.8 oz (79.289 kg)  01/13/12 177 lb 12.8 oz (80.65 kg)   General - Obese  HEENT - septal deviation, clear nasal discharge, no sinus tenderness, no oral exudate, mild erythema posterior pharynx, no LAN, no thyromegaly, TM clear  Cardiac - s1s2 regular, no murmur  Chest - coarse breath sounds over throat, no wheeze/rales/dullness  Abdomen - soft, nontender  Extremities - no e/c/c  Neurologic - normal strength, CN intact  Skin - no rashes  Psychiatric - normal mood, behavior   CXR 10/02/11>>no acute abnormality.  PFT 11/06/10>>FEV1 1.79 (102%), FEV1% 82, TLC 4.18  (104%), DLCO 69%, positive BD.  CT sinus 11/08/11>>Negative paranasal sinuses.  Rightward nasal septal deviation and possible rhinitis.  01/13/12>>RAST negative, IgE 22.7  Assessment/Plan:  Outpatient Encounter Prescriptions as of 04/13/2012  Medication Sig Dispense Refill  . albuterol (PROVENTIL HFA) 108 (90 BASE) MCG/ACT inhaler Inhale 2 puffs into the lungs every 6 (six) hours as needed for wheezing or shortness of breath.  1 Inhaler  3  . ALPRAZolam (XANAX) 0.5 MG tablet take 1 tablet by mouth every 8 hours if needed  30 tablet  2  . amLODipine (NORVASC) 10 MG tablet Take 1 tablet (10 mg total) by mouth daily.  90 tablet  2  . aspirin 81 MG tablet Take 81 mg by mouth daily.      . beclomethasone (QVAR) 80 MCG/ACT inhaler Inhale 2 puffs into the lungs 2 (two) times daily.  1 Inhaler  5  . cholecalciferol (VITAMIN D) 1000 UNITS tablet Take 1,000 Units by mouth daily.      . fluticasone (FLONASE) 50 MCG/ACT nasal spray Place 1 spray into the nose 2 (two) times daily.  48 g  1  . glucose blood (ONE TOUCH ULTRA TEST) test strip Use as instructed  100 each  5  . HYDROcodone-homatropine (HYDROMET) 5-1.5 MG/5ML syrup Take 5 mLs by mouth every 6 (six) hours as needed for cough.  240 mL  0  . loperamide (  IMODIUM A-D) 2 MG tablet Take 2 mg by mouth as needed.       Marland Kitchen losartan-hydrochlorothiazide (HYZAAR) 100-25 MG per tablet Take 1 tablet by mouth daily.  90 tablet  2  . metFORMIN (GLUCOPHAGE) 500 MG tablet take 1 tablet by mouth twice a day with food  60 tablet  5  . montelukast (SINGULAIR) 10 MG tablet Take 1 tablet (10 mg total) by mouth at bedtime.  30 tablet  5  . Multiple Vitamin (MULTIVITAMIN) tablet Take 1 tablet by mouth daily.        . mupirocin (BACTROBAN) 2 % ointment Apply 1 application topically 3 (three) times daily as needed.       Marland Kitchen omeprazole (PRILOSEC) 40 MG capsule Take 1 capsule (40 mg total) by mouth daily.  90 capsule  2  . potassium chloride SA (K-DUR,KLOR-CON) 20 MEQ tablet  Take 1 tablet (20 mEq total) by mouth daily.  90 tablet  2  . sertraline (ZOLOFT) 50 MG tablet Take 1 tablet (50 mg total) by mouth daily.  90 tablet  2  . Spacer/Aero-Holding Chambers (AEROCHAMBER MV) inhaler Use as instructed  1 each  0  . Lancets (ONETOUCH ULTRASOFT) lancets PRN  100 each  5  . Omega-3 Fatty Acids (FISH OIL) 1000 MG CAPS Take 2 capsules by mouth daily. On hold      . zoster vaccine live, PF, (ZOSTAVAX) 45409 UNT/0.65ML injection Inject 19,400 Units into the skin once.  1 each  0  . DISCONTD: naproxen (NAPROSYN) 500 MG tablet Take 1 tablet (500 mg total) by mouth 2 (two) times daily with a meal.  30 tablet  0    Clarrissa Shimkus Pager:  [phone] 04/13/2012, 12:03 PM

## 2012-04-17 ENCOUNTER — Telehealth: Payer: Self-pay | Admitting: Internal Medicine

## 2012-04-17 ENCOUNTER — Ambulatory Visit (INDEPENDENT_AMBULATORY_CARE_PROVIDER_SITE_OTHER): Payer: Medicare Other | Admitting: Internal Medicine

## 2012-04-17 ENCOUNTER — Encounter: Payer: Self-pay | Admitting: Internal Medicine

## 2012-04-17 ENCOUNTER — Telehealth: Payer: Self-pay | Admitting: Pulmonary Disease

## 2012-04-17 VITALS — BP 102/58 | HR 73 | Temp 99.3°F | Ht 60.0 in | Wt 176.0 lb

## 2012-04-17 DIAGNOSIS — J45909 Unspecified asthma, uncomplicated: Secondary | ICD-10-CM

## 2012-04-17 DIAGNOSIS — R05 Cough: Secondary | ICD-10-CM | POA: Diagnosis not present

## 2012-04-17 DIAGNOSIS — R059 Cough, unspecified: Secondary | ICD-10-CM

## 2012-04-17 DIAGNOSIS — R053 Chronic cough: Secondary | ICD-10-CM

## 2012-04-17 MED ORDER — FAMOTIDINE 20 MG PO TABS: ORAL_TABLET | ORAL | Status: DC

## 2012-04-17 MED ORDER — PREDNISONE (PAK) 10 MG PO TABS: ORAL_TABLET | ORAL | Status: AC

## 2012-04-17 MED ORDER — BUDESONIDE-FORMOTEROL FUMARATE 80-4.5 MCG/ACT IN AERO: 2.0000 | INHALATION_SPRAY | Freq: Two times a day (BID) | RESPIRATORY_TRACT | Status: DC

## 2012-04-17 NOTE — Telephone Encounter (Signed)
lmomtcb x1 for pt 

## 2012-04-17 NOTE — Telephone Encounter (Signed)
Returning call can be reached at [phone].Heidi Franklin

## 2012-04-17 NOTE — Patient Instructions (Addendum)
Omeprazole 40 mg Take 30-60 min before first meal of the day   Pepcid 20 mg at bedtime  Stop qvar and use symbicort 80 Take 2 puffs first thing in am and then another 2 puffs about 12 hours later.      Prednisone 10 mg take  4 each am x 2 days,   2 each am x 2 days,  1 each am x2days and stop   Work on inhaler technique:  relax and gently blow all the way out then take a nice smooth deep breath back in, triggering the inhaler at same time you start breathing in.  Hold for up to 5 seconds if you can.  Rinse and gargle with water when done   If your mouth or throat starts to bother you,   I suggest you time the inhaler to your dental care and after using the inhaler(s) brush teeth and tongue with a baking soda containing toothpaste and when you rinse this out, gargle with it first to see if this helps your mouth and throat.     Return w/in 2 weeks to see [name] Np to re-evaluate your inhaler technique and response to symbicort

## 2012-04-17 NOTE — Telephone Encounter (Signed)
Pt is coming in at 11:30 to see MW this AM

## 2012-04-17 NOTE — Telephone Encounter (Signed)
VS, please advise if you are okay with the pt switching to MW. Thanks!

## 2012-04-17 NOTE — Assessment & Plan Note (Signed)
PFT 11/07/11>>FEV1 1.79 (102%), FEV1% 82, TLC 4.18 (104%), DLCO 69%, positive BD only in terms of fef 25/75  The most common causes of chronic cough in immunocompetent adults include the following: upper airway cough syndrome (UACS), previously referred to as postnasal drip syndrome (PNDS), which is caused by variety of rhinosinus conditions; (2) asthma; (3) GERD; (4) chronic bronchitis from cigarette smoking or other inhaled environmental irritants; (5) nonasthmatic eosinophilic bronchitis; and (6) bronchiectasis.   These conditions, singly or in combination, have accounted for up to 94% of the causes of chronic cough in prospective studies.   Other conditions have constituted no >6% of the causes in prospective studies These have included bronchogenic carcinoma, chronic interstitial pneumonia, sarcoidosis, left ventricular failure, ACEI-induced cough, and aspiration from a condition associated with pharyngeal dysfunction.   .Chronic cough is often simultaneously caused by more than one condition. A single cause has been found from 38 to 82% of the time, multiple causes from 18 to 62%. Multiply caused cough has been the result of three diseases up to 42% of the time.    Whatever the cause, it's clearly worse and improves with saba and prednisone with very poor baseline hfa technique  The proper method of use, as well as anticipated side effects, of a metered-dose inhaler are discussed and demonstrated to the patient. Improved effectiveness after extensive coaching during this visit to a level of approximately  50% from a baseline of near zero so worth trying another round of prednisone (for allergic/ eosinophic/asthma coverage) and try on low dose symbicort 2 bid  If not able to achieve better mdi, strongly consider a trial of perfomist/bud as advair not a good choice here.   Will also max gerd rx on a trial basis.

## 2012-04-17 NOTE — Telephone Encounter (Signed)
Ok to work in.

## 2012-04-17 NOTE — Progress Notes (Signed)
   History of Present Illness: Heidi Franklin is a 47 yobf never smoker with chronic cough with asthma, post-nasal drip with nasal septal deviation, and GERD.  04/17/2012 Acute ov/Nevin Kozuch w/in cc  Cough only about ever 50% improved at baseline then much worse x 5 days,  most notable after lie down but  also during the day,  Producing minimal clear mucus on mucinex - not able to use qvar effectively and having to use lots of saba which helps some but prednisone helps the most.   no assoc overt sinus or reflux symptoms   Denies early am exacerbation  of respiratory  c/o's or need for noct saba in general.   Also denies any obvious fluctuation of symptoms with weather or environmental changes or other aggravating or alleviating factors except as outlined above   ROS  At present neg for  any significant sore throat, dysphagia, dental problems, itching, sneezing,  nasal congestion or excess/ purulent secretions, ear ache,   fever, chills, sweats, unintended wt loss, pleuritic or exertional cp, hemoptysis, palpitations, orthopnea pnd or leg swelling.  Also denies presyncope, palpitations, heartburn, abdominal pain, anorexia, nausea, vomiting, diarrhea  or change in bowel or urinary habits, change in stools or urine, dysuria,hematuria,  rash, arthralgias, visual complaints, headache, numbness weakness or ataxia or problems with walking or coordination. No noted change in mood/affect or memory.      Past Medical History  Diagnosis Date  . BRONCHITIS, CHRONIC   . ALLERGIC RHINITIS   . Anxiety state, unspecified   . DEPRESSION   . DIABETES MELLITUS, TYPE II   . GERD   . HYPERLIPIDEMIA   . HYPERTENSION     Past Surgical History  Procedure Date  . Abdominal hysterectomy 1980  . Trigger finger release     Index finger    Allergies  Allergen Reactions  . Penicillins     Physical Exam: Wt Readings from Last 3 Encounters:  04/17/12 176 lb (79.833 kg)  04/13/12 174 lb 12.8 oz (79.289 kg)  01/13/12  177 lb 12.8 oz (80.65 kg)     General - Obese  HEENT - septal deviation, clear nasal discharge, no sinus tenderness, no oral exudate, mild erythema posterior pharynx, no LAN, no thyromegaly, TM clear  Cardiac - s1s2 regular, no murmur  Chest - trace exp wheeze assoc with cough  Abdomen - soft, nontender  Extremities - no e/c/c  Neurologic - normal strength, CN intact  Skin - no rashes  Psychiatric - normal mood, behavior   CXR 10/02/11>>no acute abnormality.  PFT 11/07/11>>FEV1 1.79 (102%), FEV1% 82, TLC 4.18 (104%), DLCO 69%, positive BD.  CT sinus 11/08/11>>Negative paranasal sinuses.  Rightward nasal septal deviation and possible rhinitis.  01/13/12>>RAST negative, IgE 22.7  Assessment/Plan:

## 2012-04-17 NOTE — Telephone Encounter (Signed)
Pt returned triage's call & asked to be reached on her cell, [phone].  Antionette Fairy

## 2012-04-17 NOTE — Telephone Encounter (Signed)
I spoke with pt and she c/o cough w/ very little clear phlem, wheezing, some increase SOB, and hoarseness x couple days. Pt has been taking mucinex 1 po BID and her hydromet cough syrup. Pt was seen on 04/13/12 by VS. Since no available openings pt is requesting recs. Since VS if off will forward to doc of the day. Please advise MW thanks  Allergies  Allergen Reactions  . Penicillins

## 2012-04-17 NOTE — Telephone Encounter (Signed)
lmomtcb x1 

## 2012-04-23 NOTE — Telephone Encounter (Signed)
Pr is aware and will be seen on 05/05/12 for med cal.

## 2012-04-23 NOTE — Telephone Encounter (Signed)
That's fine

## 2012-04-23 NOTE — Telephone Encounter (Signed)
Ok with me too but really the key here is to see [name] first.   Set this up: See [name] NP w/in 2 weeks with all your medications, even over the counter meds, separated in two separate bags, the ones you take no matter what vs the ones you stop once you feel better and take only as needed when you feel you need them.   [name]  will generate for you a new user friendly medication calendar that will put [place] all on the same page re: your medication use.

## 2012-04-23 NOTE — Telephone Encounter (Signed)
Dr Sherene Sires is this okay with you? Please advise, thanks!!

## 2012-05-01 ENCOUNTER — Other Ambulatory Visit: Payer: Self-pay | Admitting: *Deleted

## 2012-05-01 DIAGNOSIS — I10 Essential (primary) hypertension: Secondary | ICD-10-CM

## 2012-05-01 DIAGNOSIS — F411 Generalized anxiety disorder: Secondary | ICD-10-CM

## 2012-05-01 MED ORDER — LOSARTAN POTASSIUM-HCTZ 100-25 MG PO TABS: 1.0000 | ORAL_TABLET | Freq: Every day | ORAL | Status: DC

## 2012-05-01 MED ORDER — SERTRALINE HCL 50 MG PO TABS: 50.0000 mg | ORAL_TABLET | Freq: Every day | ORAL | Status: DC

## 2012-05-01 MED ORDER — OMEPRAZOLE 40 MG PO CPDR: 40.0000 mg | DELAYED_RELEASE_CAPSULE | Freq: Every day | ORAL | Status: DC

## 2012-05-01 MED ORDER — POTASSIUM CHLORIDE CRYS ER 20 MEQ PO TBCR: 20.0000 meq | EXTENDED_RELEASE_TABLET | Freq: Every day | ORAL | Status: DC

## 2012-05-01 MED ORDER — AMLODIPINE BESYLATE 10 MG PO TABS: 10.0000 mg | ORAL_TABLET | Freq: Every day | ORAL | Status: DC

## 2012-05-01 MED ORDER — METFORMIN HCL 500 MG PO TABS: 500.0000 mg | ORAL_TABLET | Freq: Two times a day (BID) | ORAL | Status: DC

## 2012-05-01 MED ORDER — FLUTICASONE PROPIONATE 50 MCG/ACT NA SUSP: 1.0000 | Freq: Two times a day (BID) | NASAL | Status: DC

## 2012-05-01 NOTE — Telephone Encounter (Signed)
[facility]

## 2012-05-05 ENCOUNTER — Ambulatory Visit: Payer: Medicare Other | Admitting: Adult Health

## 2012-05-05 ENCOUNTER — Encounter: Payer: Self-pay | Admitting: Adult Health

## 2012-05-05 ENCOUNTER — Ambulatory Visit (INDEPENDENT_AMBULATORY_CARE_PROVIDER_SITE_OTHER): Payer: Medicare Other | Admitting: Adult Health

## 2012-05-05 VITALS — BP 138/62 | HR 97 | Temp 97.1°F | Ht 60.0 in | Wt 174.4 lb

## 2012-05-05 DIAGNOSIS — R059 Cough, unspecified: Secondary | ICD-10-CM | POA: Diagnosis not present

## 2012-05-05 DIAGNOSIS — R05 Cough: Secondary | ICD-10-CM

## 2012-05-05 DIAGNOSIS — R053 Chronic cough: Secondary | ICD-10-CM

## 2012-05-05 NOTE — Patient Instructions (Addendum)
Continue on current regimen .  Brush/rinse and gargle after inhaler use.  Follow med calendar closely and bring to each visit.  follow up Dr. Sherene Sires  In 3-4 months and As needed

## 2012-05-05 NOTE — Assessment & Plan Note (Signed)
IMproved on GERD regimen and symbicort  Patient's medications were reviewed today and patient education was given. Computerized medication calendar was adjusted/completed  Cont on current regimen  follow up in 3 months with Dr. Sherene Sires

## 2012-05-05 NOTE — Progress Notes (Signed)
   History of Present Illness: Heidi Franklin is a 56 yobf never smoker with chronic cough with asthma, post-nasal drip with nasal septal deviation, and GERD.  04/17/2012 Acute ov/Wert w/in cc  Cough only about ever 50% improved at baseline then much worse x 5 days,  most notable after lie down but  also during the day,  Producing minimal clear mucus on mucinex - not able to use qvar effectively and having to use lots of saba which helps some but prednisone helps the most.   no assoc overt sinus or reflux symptoms >>added PPI/Pepcid and changed from qvar to symbicort   05/05/2012 Follow up and med review Returns for follow up and med review  Reviewed all her meds and organized them into a med calendar with pt education  Appears to be taking correctly.  Improved from last ov -started on Symbicort and GERD regimen.  Tolerating well.  Decreased dyspnea and wheezing.  No chest pain or edema.     ROS:  Constitutional:   No  weight loss, night sweats,  Fevers, chills, fatigue, or  lassitude.  HEENT:   No headaches,  Difficulty swallowing,  Tooth/dental problems, or  Sore throat,                No sneezing, itching, ear ache,  +nasal congestion, post nasal drip,   CV:  No chest pain,  Orthopnea, PND, swelling in lower extremities, anasarca, dizziness, palpitations, syncope.   GI  No heartburn, indigestion, abdominal pain, nausea, vomiting, diarrhea, change in bowel habits, loss of appetite, bloody stools.   Resp: No shortness of breath with exertion or at rest.  No excess mucus, no productive cough,  No non-productive cough,  No coughing up of blood.  No change in color of mucus.     No chest wall deformity  Skin: no rash or lesions.  GU: no dysuria, change in color of urine, no urgency or frequency.  No flank pain, no hematuria   MS:  No joint pain or swelling.  No decreased range of motion.  No back pain.  Psych:  No change in mood or affect. No depression or anxiety.  No memory  loss.     Exam:   General - Obese  HEENT - septal deviation, clear nasal discharge, no sinus tenderness, no oral exudate, mild erythema posterior pharynx, no LAN, no thyromegaly, TM clear  Cardiac - s1s2 regular, no murmur  Chest - clear no wheeze  Abdomen - soft, nontender  Extremities - no e/c/c  Neurologic - normal strength, CN intact  Skin - no rashes  Psychiatric - normal mood, behavior   CXR 10/02/11>>no acute abnormality.  PFT 11/07/11>>FEV1 1.79 (102%), FEV1% 82, TLC 4.18 (104%), DLCO 69%, positive BD.  CT sinus 11/08/11>>Negative paranasal sinuses.  Rightward nasal septal deviation and possible rhinitis.  01/13/12>>RAST negative, IgE 22.7  Med calendar 05/05/2012

## 2012-05-11 NOTE — Addendum Note (Signed)
Addended by: Nita Sells on: 05/11/2012 02:38 PM   Modules accepted: Orders

## 2012-05-18 ENCOUNTER — Telehealth: Payer: Self-pay | Admitting: Internal Medicine

## 2012-05-18 MED ORDER — BUDESONIDE-FORMOTEROL FUMARATE 80-4.5 MCG/ACT IN AERO: 2.0000 | INHALATION_SPRAY | Freq: Two times a day (BID) | RESPIRATORY_TRACT | Status: DC

## 2012-05-18 NOTE — Telephone Encounter (Signed)
Refill sent. Pt is aware. Jennifer Castillo, CMA  

## 2012-06-10 ENCOUNTER — Encounter: Payer: Self-pay | Admitting: Internal Medicine

## 2012-06-10 ENCOUNTER — Ambulatory Visit (INDEPENDENT_AMBULATORY_CARE_PROVIDER_SITE_OTHER): Payer: Medicare Other | Admitting: Internal Medicine

## 2012-06-10 ENCOUNTER — Other Ambulatory Visit (INDEPENDENT_AMBULATORY_CARE_PROVIDER_SITE_OTHER): Payer: Medicare Other

## 2012-06-10 VITALS — BP 138/74 | HR 97 | Temp 98.6°F | Ht 60.0 in | Wt 173.8 lb

## 2012-06-10 DIAGNOSIS — R5381 Other malaise: Secondary | ICD-10-CM

## 2012-06-10 DIAGNOSIS — E119 Type 2 diabetes mellitus without complications: Secondary | ICD-10-CM

## 2012-06-10 DIAGNOSIS — R5383 Other fatigue: Secondary | ICD-10-CM | POA: Diagnosis not present

## 2012-06-10 DIAGNOSIS — I10 Essential (primary) hypertension: Secondary | ICD-10-CM

## 2012-06-10 DIAGNOSIS — E785 Hyperlipidemia, unspecified: Secondary | ICD-10-CM

## 2012-06-10 LAB — CBC WITH DIFFERENTIAL/PLATELET
Basophils Absolute: 0 10*3/uL (ref 0.0–0.1)
Basophils Relative: 0.1 % (ref 0.0–3.0)
Eosinophils Absolute: 0.2 10*3/uL (ref 0.0–0.7)
Eosinophils Relative: 2 % (ref 0.0–5.0)
HCT: 38.5 % (ref 36.0–46.0)
Hemoglobin: 12.2 g/dL (ref 12.0–15.0)
Lymphocytes Relative: 24.4 % (ref 12.0–46.0)
Lymphs Abs: 2.7 10*3/uL (ref 0.7–4.0)
MCHC: 31.8 g/dL (ref 30.0–36.0)
MCV: 89.8 fl (ref 78.0–100.0)
Monocytes Absolute: 0.7 10*3/uL (ref 0.1–1.0)
Monocytes Relative: 6 % (ref 3.0–12.0)
Neutro Abs: 7.3 10*3/uL (ref 1.4–7.7)
Neutrophils Relative %: 67.5 % (ref 43.0–77.0)
Platelets: 272 10*3/uL (ref 150.0–400.0)
RBC: 4.29 Mil/uL (ref 3.87–5.11)
RDW: 14 % (ref 11.5–14.6)
WBC: 10.9 10*3/uL — ABNORMAL HIGH (ref 4.5–10.5)

## 2012-06-10 LAB — LIPID PANEL
Cholesterol: 183 mg/dL (ref 0–200)
HDL: 39.2 mg/dL (ref >39.00)
LDL Cholesterol: 111 mg/dL — ABNORMAL HIGH (ref 0–99)
Total CHOL/HDL Ratio: 5
Triglycerides: 162 mg/dL — ABNORMAL HIGH (ref 0.0–149.0)
VLDL: 32.4 mg/dL (ref 0.0–40.0)

## 2012-06-10 LAB — HEPATIC FUNCTION PANEL
ALT: 26 U/L (ref 0–35)
AST: 32 U/L (ref 0–37)
Albumin: 3.8 g/dL (ref 3.5–5.2)
Alkaline Phosphatase: 59 U/L (ref 39–117)
Bilirubin, Direct: 0.1 mg/dL (ref 0.0–0.3)
Total Bilirubin: 0.4 mg/dL (ref 0.3–1.2)
Total Protein: 7.6 g/dL (ref 6.0–8.3)

## 2012-06-10 LAB — BASIC METABOLIC PANEL
BUN: 14 mg/dL (ref 6–23)
CO2: 29 mEq/L (ref 19–32)
Calcium: 9.6 mg/dL (ref 8.4–10.5)
Chloride: 103 mEq/L (ref 96–112)
Creatinine, Ser: 0.7 mg/dL (ref 0.4–1.2)
GFR: 109.06 mL/min (ref >60.00)
Glucose, Bld: 107 mg/dL — ABNORMAL HIGH (ref 70–99)
Potassium: 4.1 mEq/L (ref 3.5–5.1)
Sodium: 141 mEq/L (ref 135–145)

## 2012-06-10 LAB — HEMOGLOBIN A1C: Hgb A1c MFr Bld: 6.7 % — ABNORMAL HIGH (ref 4.6–6.5)

## 2012-06-10 LAB — TSH: TSH: 1.81 u[IU]/mL (ref 0.35–5.50)

## 2012-06-10 MED ORDER — GLUCOSE BLOOD VI STRP: 1.0000 | ORAL_STRIP | Freq: Every day | Status: DC

## 2012-06-10 NOTE — Assessment & Plan Note (Signed)
Prev rx'd statins including simva and prava but pt has been afraid of potential side effects, noncompliance with prior rx due to same  - May reconsider given spouse cardiac problems 02/2012  recheck now and start statin if needed reviewed importance of good lipid control reviewed again with pt, esp given co-dz with diabetes mellitus + hypertension

## 2012-06-10 NOTE — Patient Instructions (Signed)
It was good to see you today. Test(s) ordered today. Your results will be called to you after review (48-72hours after test completion). If any changes need to be made, you will be notified at that time. medications reviewed, no changes at this time. Please schedule followup in 4 months for review of diabetes mellitus and blood pressure, call sooner if problems.

## 2012-06-10 NOTE — Progress Notes (Signed)
Subjective:    Patient ID: Heidi Franklin, female    DOB: October 12, 1946, 66 y.o.   MRN: [id number]  HPI  Here for follow up - reviewed chronic medical issues today:  hypertension - reports compliance with ongoing medical treatment and no changes in medication dose or frequency. denies adverse side effects related to current therapy.   dyslipidemia - previously declined statin - working on diet/exercise  DM2 - reports compliance with ongoing medical treatment and no changes in medication dose or frequency. denies adverse side effects related to current therapy.  home cbgs 130-140, checks sugars few times each week - no signs or symptoms hypoglycemia  chronic bronchitis and allergies, working with pulm for same - cough remains dry - no mucus or fever or sore throat, chronic mild hoarseness from nasal drainage - no smoking hx - no COPD or asthma  GERD symptoms - on omeprazole qd with improvement, but intermittent use. Denies adverse side effects.  Anxiety , exac by spouse illness 02/2012 (AA surg, [place]) - takes BZ prn and started sertraline summer 2012 - The current medical regimen is effective;  continue present plan and medications.   Trigger finger s/p cortisone injection 05/31/11 - less popping in L 4th finger but still stiffness  Past Medical History  Diagnosis Date  . BRONCHITIS, CHRONIC   . ALLERGIC RHINITIS   . Anxiety state, unspecified   . DEPRESSION   . DIABETES MELLITUS, TYPE II   . GERD   . HYPERLIPIDEMIA   . HYPERTENSION     Review of Systems  Constitutional: Positive for fatigue. Negative for fever and unexpected weight change.  Respiratory: Positive for cough. Negative for shortness of breath.   Cardiovascular: Negative for chest pain and palpitations.  Musculoskeletal: Negative for back pain.       Objective:   Physical Exam  BP 138/74  Pulse 97  Temp 98.6 F (37 C) (Oral)  Ht 5' (1.524 m)  Wt 173 lb 12.8 oz (78.835 kg)  BMI 33.94 kg/m2  SpO2 97% Wt Readings  from Last 3 Encounters:  06/10/12 173 lb 12.8 oz (78.835 kg)  05/05/12 174 lb 6.4 oz (79.107 kg)  04/17/12 176 lb (79.833 kg)   Constitutional: She is overweight, but appears well-developed and well-nourished. No distress.  Neck: Normal range of motion. Neck supple. No JVD present. No thyromegaly present.  Cardiovascular: Normal rate, regular rhythm and normal heart sounds.  No murmur heard. No BLE edema. Pulmonary/Chest: Effort normal and breath sounds normal. No respiratory distress. She has no wheezes.  Psychiatric: She has a mildly anxious mood and affect. Her behavior is normal. Judgment and thought content normal.   Lab Results  Component Value Date   WBC 9.1 08/20/2011   HGB 12.3 08/20/2011   HCT 36.3 08/20/2011   PLT 242.0 08/20/2011   GLUCOSE 103* 03/02/2010   CHOL 197 01/14/2012   TRIG 157.0* 01/14/2012   HDL 41.20 01/14/2012   LDLCALC 124* 01/14/2012   ALT 38* 03/02/2010   AST 41* 03/02/2010   NA 142 03/02/2010   K 3.8 03/02/2010   CL 105 03/02/2010   CREATININE 0.9 03/13/2011   BUN 14 03/02/2010   CO2 28 03/02/2010   TSH 3.19 08/20/2011   HGBA1C 6.8* 01/14/2012   MICROALBUR 0.7 03/02/2010        Assessment & Plan:  See problem list. Medications and labs reviewed today.  Fatigue - suspect stress induced - spouse with cardiac event and surgery 02/2012 - denies depression - exam unremarkable -  check screening labs - support offered

## 2012-06-10 NOTE — Assessment & Plan Note (Signed)
On metformin; also ARB, ASA 81 - declines statin but may reconsider given spouse MI 02/2012 reports home cbgs "ok" Check a1c now, adjust if needed Lab Results  Component Value Date   HGBA1C 6.8* 01/14/2012   Lab Results  Component Value Date   CREATININE 0.9 03/13/2011

## 2012-06-10 NOTE — Assessment & Plan Note (Signed)
suboptimal control - increased amlodipine dose 02/2011 - cont max ARB/hctz Encouraged to work on lifestyle control and weight loss BP Readings from Last 3 Encounters:  06/10/12 138/74  05/05/12 138/62  04/17/12 102/58

## 2012-06-19 ENCOUNTER — Other Ambulatory Visit: Payer: Self-pay

## 2012-06-19 MED ORDER — ALBUTEROL SULFATE HFA 108 (90 BASE) MCG/ACT IN AERS: 2.0000 | INHALATION_SPRAY | Freq: Four times a day (QID) | RESPIRATORY_TRACT | Status: DC | PRN

## 2012-07-10 ENCOUNTER — Other Ambulatory Visit: Payer: Self-pay | Admitting: *Deleted

## 2012-07-10 DIAGNOSIS — R053 Chronic cough: Secondary | ICD-10-CM

## 2012-07-10 DIAGNOSIS — R05 Cough: Secondary | ICD-10-CM

## 2012-07-10 MED ORDER — MONTELUKAST SODIUM 10 MG PO TABS: 10.0000 mg | ORAL_TABLET | Freq: Every day | ORAL | Status: DC

## 2012-07-21 ENCOUNTER — Telehealth: Payer: Self-pay | Admitting: General Practice

## 2012-07-21 NOTE — Telephone Encounter (Signed)
Received Paperwork from [place] stating they need a signed order on file in order to fill this. Need signed and returned. Placed in basket on 9/24.

## 2012-07-23 NOTE — Telephone Encounter (Signed)
done

## 2012-07-30 ENCOUNTER — Ambulatory Visit (INDEPENDENT_AMBULATORY_CARE_PROVIDER_SITE_OTHER): Payer: Medicare Other | Admitting: *Deleted

## 2012-07-30 DIAGNOSIS — Z23 Encounter for immunization: Secondary | ICD-10-CM

## 2012-08-03 ENCOUNTER — Encounter: Payer: Self-pay | Admitting: Internal Medicine

## 2012-08-03 ENCOUNTER — Ambulatory Visit (INDEPENDENT_AMBULATORY_CARE_PROVIDER_SITE_OTHER): Payer: Medicare Other | Admitting: Internal Medicine

## 2012-08-03 VITALS — BP 130/70 | HR 94 | Temp 98.6°F | Ht 60.0 in | Wt 173.8 lb

## 2012-08-03 DIAGNOSIS — J310 Chronic rhinitis: Secondary | ICD-10-CM | POA: Diagnosis not present

## 2012-08-03 DIAGNOSIS — J45991 Cough variant asthma: Secondary | ICD-10-CM | POA: Diagnosis not present

## 2012-08-03 DIAGNOSIS — R05 Cough: Secondary | ICD-10-CM | POA: Diagnosis not present

## 2012-08-03 DIAGNOSIS — R059 Cough, unspecified: Secondary | ICD-10-CM | POA: Diagnosis not present

## 2012-08-03 DIAGNOSIS — R053 Chronic cough: Secondary | ICD-10-CM

## 2012-08-03 NOTE — Patient Instructions (Addendum)
Change zyrtec to where you take it only if needed for itching/ sneezing   Work on inhaler technique:  relax and gently blow all the way out then take a nice smooth deep breath back in, triggering the inhaler at same time you start breathing in.  Hold for up to 5 seconds if you can.  Rinse and gargle with water when done   If your mouth or throat starts to bother you,   I suggest you time the inhaler to your dental care and after using the inhaler(s) brush teeth and tongue with a baking soda containing toothpaste and when you rinse this out, gargle with it first to see if this helps your mouth and throat.     Ok to use symbicort with spacer if not improving  See calendar for specific medication instructions and bring it back for each and every office visit for every healthcare provider you see.  Without it,  you may not receive the best quality medical care that we feel you deserve.  You will note that the calendar groups together  your maintenance  medications that are timed at particular times of the day.  Think of this as your checklist for what your doctor has instructed you to do until your next evaluation to see what benefit  there is  to staying on a consistent group of medications intended to keep you well.  The other group at the bottom is entirely up to you to use as you see fit  for specific symptoms that may arise between visits that require you to treat them on an as needed basis.  Think of this as your action plan or "what if" list.   Separating the top medications from the bottom group is fundamental to providing you adequate care going forward.

## 2012-08-03 NOTE — Assessment & Plan Note (Signed)
CT sinus 11/08/11>>Negative paranasal sinuses.  Rightward nasal septal deviation and possible rhinitis. 01/13/12>>RAST negative, IgE 22.7  May also be contributing to her cough typical of  Classic Upper airway cough syndrome, so named because it's frequently impossible to sort out how much is  CR/sinusitis with freq throat clearing (which can be related to primary GERD)   vs  causing  secondary (" extra esophageal")  GERD from wide swings in gastric pressure that occur with throat clearing, often  promoting self use of mint and menthol lozenges that reduce the lower esophageal sphincter tone and exacerbate the problem further in a cyclical fashion.   These are the same pts (now being labeled as having "irritable larynx syndrome" by some cough centers) who not infrequently have a history of having failed to tolerate ace inhibitors,  dry powder inhalers or biphosphonates or report having atypical reflux symptoms that don't respond to standard doses of PPI , and are easily confused as having aecopd or asthma flares by even experienced allergists/ pulmonologists.   For now continue max gerd/ pnds rx but ok to change to zyrtec prn rather than as a maint rx.    Each maintenance medication was reviewed in detail including most importantly the difference between maintenance and as needed and under what circumstances the prns are to be used. This was done in the context of a medication calendar review which provided the patient with a user-friendly unambiguous mechanism for medication administration and reconciliation and provides an action plan for all active problems. It is critical that this be shown to every doctor  for modification during the office visit if necessary so the patient can use it as a working document.

## 2012-08-03 NOTE — Assessment & Plan Note (Signed)
PFT 11/07/11>>FEV1 1.79 (102%), FEV1% 82, TLC 4.18 (104%), DLCO 69%, positive BD only in terms of fef 25/75  - hfa 50% p coaching 04/17/2012 > try symbicort 80 2bid> only 50% 08/03/2012    Much better than baseline on symbicort 80 2bid despite poor hfa suggest there is indeed an asthma component to her cough The proper method of use, as well as anticipated side effects, of a metered-dose inhaler are discussed and demonstrated to the patient. Improved effectiveness after extensive coaching during this visit to a level of approximately  50% - if can't do better ok to add spacer next  See instructions for specific recommendations which were reviewed directly with the patient who was given a copy with highlighter outlining the key components.

## 2012-08-03 NOTE — Progress Notes (Signed)
   History of Present Illness:  59 yobf never smoker with chronic cough with asthma, post-nasal drip with nasal septal deviation, and GERD.  04/17/2012 Acute ov/Mystic Labo w/in cc  Cough only about ever 50% improved at baseline then much worse x 5 days,  most notable after lie down but  also during the day,  Producing minimal clear mucus on mucinex - not able to use qvar effectively and having to use lots of saba which helps some but prednisone helps the most.   no assoc overt sinus or reflux symptoms >>added PPI/Pepcid and changed from qvar to symbicort   05/05/2012 Follow up and med review Returns for follow up and med review  Reviewed all her meds and organized them into a med calendar with pt education  Appears to be taking correctly.  Improved from last ov -started on Symbicort and GERD regimen.  Tolerating well.  Decreased dyspnea and wheezing.  No chest pain or edema.  rec Continue on current regimen .  Brush/rinse and gargle after inhaler use.  Follow med calendar closely and bring to each visit.  follow up Dr. Sherene Sires  In 3-4 months and As needed     08/03/2012 f/u ov/Kendalyn Cranfield cc problems for decades, better on present rx than for many years prior to pulmonary clinic changes, not using calendar, poor hfa still issues but no limiting sob.   No obvious daytime variabilty or assoc sputum production or cp or chest tightness, subjective wheeze overt sinus or hb symptoms. No unusual exp hx  - does have some mild hoarsensess, no using rescue saba at all, didn't bring one with her   Sleeping ok without nocturnal  or early am exacerbation  of respiratory  c/o's or need for noct saba. Also denies any obvious fluctuation of symptoms with weather or environmental changes or other aggravating or alleviating factors except as outlined above   ROS  The following are not active complaints unless bolded sore throat, dysphagia, dental problems, itching, sneezing,  nasal congestion or excess/ purulent secretions,  ear ache,   fever, chills, sweats, unintended wt loss, pleuritic or exertional cp, hemoptysis,  orthopnea pnd or leg swelling, presyncope, palpitations, heartburn, abdominal pain, anorexia, nausea, vomiting, diarrhea  or change in bowel or urinary habits, change in stools or urine, dysuria,hematuria,  rash, arthralgias, visual complaints, headache, numbness weakness or ataxia or problems with walking or coordination,  change in mood/affect or memory.                 Exam:   General - Obese  HEENT - septal deviation, clear nasal discharge, no sinus tenderness, no oral exudate, mild erythema posterior pharynx, no LAN, no thyromegaly, TM clear  Cardiac - s1s2 regular, no murmur  Chest - clear no wheeze  Abdomen - soft, nontender  Extremities - no e/c/c  Neurologic - normal strength, CN intact  Skin - no rashes  Psychiatric - normal mood, behavior        M

## 2012-08-10 ENCOUNTER — Ambulatory Visit (INDEPENDENT_AMBULATORY_CARE_PROVIDER_SITE_OTHER): Payer: Medicare Other | Admitting: Licensed Clinical Social Worker

## 2012-08-10 DIAGNOSIS — F411 Generalized anxiety disorder: Secondary | ICD-10-CM

## 2012-08-10 DIAGNOSIS — F331 Major depressive disorder, recurrent, moderate: Secondary | ICD-10-CM | POA: Diagnosis not present

## 2012-08-26 ENCOUNTER — Telehealth: Payer: Self-pay | Admitting: Internal Medicine

## 2012-08-26 MED ORDER — BUDESONIDE-FORMOTEROL FUMARATE 80-4.5 MCG/ACT IN AERO: 2.0000 | INHALATION_SPRAY | Freq: Two times a day (BID) | RESPIRATORY_TRACT | Status: DC

## 2012-08-26 NOTE — Telephone Encounter (Signed)
Called and spoke with patient, patient requesting sample of symbicort 80/4.5. Patient aware samples left at front for pick up.  Nothing further needed at this time.

## 2012-09-11 DIAGNOSIS — M8448XA Pathological fracture, other site, initial encounter for fracture: Secondary | ICD-10-CM | POA: Diagnosis not present

## 2012-10-02 DIAGNOSIS — S93609A Unspecified sprain of unspecified foot, initial encounter: Secondary | ICD-10-CM | POA: Diagnosis not present

## 2012-10-05 DIAGNOSIS — Z1231 Encounter for screening mammogram for malignant neoplasm of breast: Secondary | ICD-10-CM | POA: Diagnosis not present

## 2012-10-05 LAB — HM MAMMOGRAPHY

## 2012-10-06 ENCOUNTER — Encounter: Payer: Self-pay | Admitting: Internal Medicine

## 2012-10-13 ENCOUNTER — Encounter: Payer: Self-pay | Admitting: Internal Medicine

## 2012-10-13 ENCOUNTER — Other Ambulatory Visit (INDEPENDENT_AMBULATORY_CARE_PROVIDER_SITE_OTHER): Payer: Medicare Other

## 2012-10-13 ENCOUNTER — Ambulatory Visit (INDEPENDENT_AMBULATORY_CARE_PROVIDER_SITE_OTHER): Payer: Medicare Other | Admitting: Internal Medicine

## 2012-10-13 VITALS — BP 140/72 | HR 100 | Temp 98.4°F | Ht 60.0 in | Wt 172.4 lb

## 2012-10-13 DIAGNOSIS — I10 Essential (primary) hypertension: Secondary | ICD-10-CM | POA: Diagnosis not present

## 2012-10-13 DIAGNOSIS — R053 Chronic cough: Secondary | ICD-10-CM

## 2012-10-13 DIAGNOSIS — E119 Type 2 diabetes mellitus without complications: Secondary | ICD-10-CM

## 2012-10-13 DIAGNOSIS — R059 Cough, unspecified: Secondary | ICD-10-CM

## 2012-10-13 DIAGNOSIS — R05 Cough: Secondary | ICD-10-CM

## 2012-10-13 DIAGNOSIS — L299 Pruritus, unspecified: Secondary | ICD-10-CM

## 2012-10-13 LAB — HEMOGLOBIN A1C: Hgb A1c MFr Bld: 7 % — ABNORMAL HIGH (ref 4.6–6.5)

## 2012-10-13 MED ORDER — TRIAMCINOLONE ACETONIDE 0.5 % EX OINT: TOPICAL_OINTMENT | Freq: Two times a day (BID) | CUTANEOUS | Status: DC

## 2012-10-13 MED ORDER — ATENOLOL 50 MG PO TABS: 50.0000 mg | ORAL_TABLET | Freq: Every day | ORAL | Status: DC

## 2012-10-13 NOTE — Progress Notes (Signed)
Subjective:    Patient ID: Heidi Franklin, female    DOB: 1946/07/19, 66 y.o.   MRN: [id number]  HPI  Here for follow up - reviewed chronic medical issues today:  hypertension - reports compliance with ongoing medical treatment and no changes in medication dose or frequency. denies adverse side effects related to current therapy.   dyslipidemia - previously declined statin - working on diet/exercise  DM2 - reports compliance with ongoing medical treatment and no changes in medication dose or frequency. denies adverse side effects related to current therapy.  home cbgs 130-140, checks sugars few times each week - no signs or symptoms hypoglycemia  chronic bronchitis and allergies, working with pulm for same - cough remains dry - no mucus or fever or sore throat, chronic mild hoarseness from nasal drainage - no smoking hx - no COPD or asthma, improved  GERD symptoms - on omeprazole qd with improvement, but intermittent use. Denies adverse side effects.  Anxiety, exac by spouse illness 02/2012 (AA surg, [place]) - takes BZ prn and started sertraline summer 2012 - The current medical regimen is effective;  continue present plan and medications.   Trigger finger s/p cortisone injection 05/31/11 - less popping in L 4th finger but still stiffness  Past Medical History  Diagnosis Date  . BRONCHITIS, CHRONIC   . ALLERGIC RHINITIS   . Anxiety state, unspecified   . DEPRESSION   . DIABETES MELLITUS, TYPE II   . GERD   . HYPERLIPIDEMIA   . HYPERTENSION     Review of Systems  Constitutional: Positive for fatigue. Negative for fever and unexpected weight change.  Respiratory: Positive for cough. Negative for shortness of breath.   Cardiovascular: Negative for chest pain and palpitations.  Musculoskeletal: Negative for back pain.       Objective:   Physical Exam  BP 140/72  Pulse 100  Temp 98.4 F (36.9 C) (Oral)  Ht 5' (1.524 m)  Wt 172 lb 6.4 oz (78.2 kg)  BMI 33.67 kg/m2  SpO2 97% Wt  Readings from Last 3 Encounters:  10/13/12 172 lb 6.4 oz (78.2 kg)  08/03/12 173 lb 12.8 oz (78.835 kg)  06/10/12 173 lb 12.8 oz (78.835 kg)   Constitutional: She is overweight, but appears well-developed and well-nourished. No distress.  Neck: Normal range of motion. Neck supple. No JVD present. No thyromegaly present.  Cardiovascular: Normal rate, regular rhythm and normal heart sounds.  No murmur heard. No BLE edema. Pulmonary/Chest: Effort normal and breath sounds normal. No respiratory distress. She has no wheezes.  Psychiatric: She has a mildly anxious mood and affect. Her behavior is normal. Judgment and thought content normal.  Skin - inflammed eczema like lesion on R anterior shin - no ulceration  Lab Results  Component Value Date   WBC 10.9* 06/10/2012   HGB 12.2 06/10/2012   HCT 38.5 06/10/2012   PLT 272.0 06/10/2012   GLUCOSE 107* 06/10/2012   CHOL 183 06/10/2012   TRIG 162.0* 06/10/2012   HDL 39.20 06/10/2012   LDLCALC 111* 06/10/2012   ALT 26 06/10/2012   AST 32 06/10/2012   NA 141 06/10/2012   K 4.1 06/10/2012   CL 103 06/10/2012   CREATININE 0.7 06/10/2012   BUN 14 06/10/2012   CO2 29 06/10/2012   TSH 1.81 06/10/2012   HGBA1C 6.7* 06/10/2012   MICROALBUR 0.7 03/02/2010        Assessment & Plan:  See problem list. Medications and labs reviewed today.  Itch post insect bite  on RLE - use steroid topical as needed

## 2012-10-13 NOTE — Assessment & Plan Note (Signed)
Chronic dry cough Follows with pul (wert) for same Improved with symicort and uses rescue Alb MDI prn,  Spriva trial ineffective early 2012

## 2012-10-13 NOTE — Assessment & Plan Note (Signed)
On metformin; also ARB, ASA 81 - declines statin but may reconsider given spouse MI 02/2012 reports home cbgs "ok" Check a1c now, adjust if needed Lab Results  Component Value Date   HGBA1C 6.7* 06/10/2012

## 2012-10-13 NOTE — Assessment & Plan Note (Signed)
suboptimal control - increased amlodipine dose 02/2011 -  Add atenolol now - erx done cont max ARB/hctz Encouraged to work on lifestyle control and weight loss BP Readings from Last 3 Encounters:  10/13/12 140/72  08/03/12 130/70  06/10/12 138/74

## 2012-10-13 NOTE — Patient Instructions (Signed)
It was good to see you today. Test(s) ordered today. Your results will be released to MyChart (or called to you) after review, usually within 72hours after test completion. If any changes need to be made, you will be notified at that same time. Medications reviewed and updated - start atenolol once daily in addition to other medicines for blood pressure control - also use ointment to skin itch as needed - Your prescription(s) have been submitted to your pharmacy. Please take as directed and contact our office if you believe you are having problem(s) with the medication(s). Please schedule followup in 4 months for review of diabetes mellitus and blood pressure, call sooner if problems.

## 2012-11-04 ENCOUNTER — Encounter: Payer: Self-pay | Admitting: Internal Medicine

## 2012-11-04 ENCOUNTER — Ambulatory Visit (INDEPENDENT_AMBULATORY_CARE_PROVIDER_SITE_OTHER): Payer: Medicare Other | Admitting: Internal Medicine

## 2012-11-04 VITALS — BP 122/68 | HR 63 | Temp 99.6°F | Ht 60.0 in | Wt 173.2 lb

## 2012-11-04 DIAGNOSIS — R05 Cough: Secondary | ICD-10-CM

## 2012-11-04 DIAGNOSIS — R059 Cough, unspecified: Secondary | ICD-10-CM

## 2012-11-04 DIAGNOSIS — J45991 Cough variant asthma: Secondary | ICD-10-CM

## 2012-11-04 DIAGNOSIS — R053 Chronic cough: Secondary | ICD-10-CM

## 2012-11-04 MED ORDER — NEBIVOLOL HCL 5 MG PO TABS: 5.0000 mg | ORAL_TABLET | Freq: Every day | ORAL | Status: DC

## 2012-11-04 MED ORDER — PREDNISONE (PAK) 10 MG PO TABS: ORAL_TABLET | ORAL | Status: DC

## 2012-11-04 NOTE — Patient Instructions (Addendum)
Continue symbicort 80 Take 2 puffs first thing in am and then another 2 puffs about 12 hours later.    Only use your levoalbuterol (xopenex) as a rescue medication to be used if you can't catch your breath by resting or doing a relaxed purse lip breathing pattern. The less you use it, the better it will work when you need it.   Prednisone 10 mg take  4 each am x 2 days,   2 each am x 2 days,  1 each am x2days and stop   Stop atenolol and use bystolic 5 mg daily   GERD (REFLUX)  is an extremely common cause of respiratory symptoms, many times with no significant heartburn at all.    It can be treated with medication, but also with lifestyle changes including avoidance of late meals, excessive alcohol, smoking cessation, and avoid fatty foods, chocolate, peppermint, colas, red wine, and acidic juices such as orange juice.  NO MINT OR MENTHOL PRODUCTS SO NO COUGH DROPS  USE SUGARLESS CANDY INSTEAD (jolley ranchers or Stover's)  NO OIL BASED VITAMINS - use powdered substitutes.  Return to see [name] in 2 weeks with med calendar with all medications and inhalers in hand.

## 2012-11-04 NOTE — Progress Notes (Deleted)
   History of Present Illness:  18 yobf never smoker with chronic cough  since age 66's only resolves for a week at a time then recurs daily,  dx as  asthma, post-nasal drip with nasal septal deviation, and GERD.  04/17/2012 Acute ov/Savian Mazon   cc  Cough only about ever 50% improved at baseline then much worse x 5 days,  most notable after lie down but  also during the day,  Producing minimal clear mucus on mucinex - not able to use qvar effectively and having to use lots of saba which helps some but prednisone helps the most.   no assoc overt sinus or reflux symptoms >>added PPI/Pepcid and changed from qvar to symbicort   05/05/2012 Follow up and med review Returns for follow up and med review  Reviewed all her meds and organized them into a med calendar with pt education  Appears to be taking correctly.  Improved from last ov -started on Symbicort and GERD regimen.  Tolerating well.  Decreased dyspnea and wheezing.  No chest pain or edema.  rec Continue on current regimen .  Brush/rinse and gargle after inhaler use.  Follow med calendar closely and bring to each visit.  follow up Dr. Sherene Sires  In 3-4 months and As needed     08/03/2012 f/u ov/Yarden Manuelito cc problems for decades, better on present rx than for many years prior to pulmonary clinic changes, not using calendar, poor hfa  technique   but no limiting sob.  rec Change zyrtec to where you take it only if needed for itching/ sneezing  Work on inhaler technique:      Ok to use symbicort with spacer if not improving   11/04/2012 f/u ov/Addam Goeller did not bring calendar and says cough some better, using saba twice daily, no limiting sob or need for daytime saba   No obvious daytime variabilty or assoc sputum production or cp or chest tightness, subjective wheeze overt sinus or hb symptoms. No unusual exp hx   Sleeping ok without nocturnal  or early am exacerbation  of respiratory  c/o's or need for noct saba. Also denies any obvious fluctuation of  symptoms with weather or environmental changes or other aggravating or alleviating factors except as outlined above   ROS  The following are not active complaints unless bolded sore throat, dysphagia, dental problems, itching, sneezing,  nasal congestion or excess/ purulent secretions, ear ache,   fever, chills, sweats, unintended wt loss, pleuritic or exertional cp, hemoptysis,  orthopnea pnd or leg swelling, presyncope, palpitations, heartburn, abdominal pain, anorexia, nausea, vomiting, diarrhea  or change in bowel or urinary habits, change in stools or urine, dysuria,hematuria,  rash, arthralgias, visual complaints, headache, numbness weakness or ataxia or problems with walking or coordination,  change in mood/affect or memory.                 Exam:   General - Obese  HEENT -   no sinus tenderness, no oral exudate, mild erythema posterior pharynx, no LAN, no thyromegaly, TM clear  Cardiac - s1s2 regular, no murmur  Chest - clear no wheeze  Abdomen - soft, nontender  Extremities - no e/c/c  Neurologic - normal strength, CN intact  Skin - no rashes  Psychiatric - normal mood, behavior

## 2012-11-08 NOTE — Progress Notes (Signed)
Subjective:     Patient ID: Heidi Franklin, female   DOB: [birth year], 67 y.o.   MRN: [id number]  HPI   History of Present Illness:  64 yobf never smoker with chronic cough  since age 78's only resolves for a week at a time then recurs daily,  dx as  asthma, post-nasal drip with nasal septal deviation, and GERD.  04/17/2012 Acute ov/Ajeet Casasola   cc  Cough only about ever 50% improved at baseline then much worse x 5 days,  most notable after lie down but  also during the day,  Producing minimal clear mucus on mucinex - not able to use qvar effectively and having to use lots of saba which helps some but prednisone helps the most.   no assoc overt sinus or reflux symptoms >>added PPI/Pepcid and changed from qvar to symbicort   05/05/2012 Follow up and med review Returns for follow up and med review  Reviewed all her meds and organized them into a med calendar with pt education  Appears to be taking correctly.  Improved from last ov -started on Symbicort and GERD regimen.  Tolerating well.  Decreased dyspnea and wheezing.  No chest pain or edema.  rec Continue on current regimen .  Brush/rinse and gargle after inhaler use.  Follow med calendar closely and bring to each visit.  follow up Dr. Sherene Sires  In 3-4 months and As needed     08/03/2012 f/u ov/Kohan Azizi cc problems for decades, better on present rx than for many years prior to pulmonary clinic changes, not using calendar, poor hfa  technique   but no limiting sob.  rec Change zyrtec to where you take it only if needed for itching/ sneezing  Work on inhaler technique:      Ok to use symbicort with spacer if not improving   11/04/2012 f/u ov/Aroldo Galli did not bring calendar and says cough only a little better really unchanged for years x a after prednisone, still with poor hfa technique and using saba twice daily, no limiting sob.   No obvious daytime variabilty or assoc sputum production or cp or chest tightness, subjective wheeze overt sinus or hb symptoms.  No unusual exp hx   Sleeping ok without nocturnal  or early am exacerbation  of respiratory  c/o's or need for noct saba. Also denies any obvious fluctuation of symptoms with weather or environmental changes or other aggravating or alleviating factors except as outlined above   ROS  The following are not active complaints unless bolded sore throat, dysphagia, dental problems, itching, sneezing,  nasal congestion or excess/ purulent secretions, ear ache,   fever, chills, sweats, unintended wt loss, pleuritic or exertional cp, hemoptysis,  orthopnea pnd or leg swelling, presyncope, palpitations, heartburn, abdominal pain, anorexia, nausea, vomiting, diarrhea  or change in bowel or urinary habits, change in stools or urine, dysuria,hematuria,  rash, arthralgias, visual complaints, headache, numbness weakness or ataxia or problems with walking or coordination,  change in mood/affect or memory.                   Review of Systems     Objective:   Physical Exam Exam:   General - Obese  HEENT -   no sinus tenderness, no oral exudate, mild erythema posterior pharynx, no LAN, no thyromegaly, TM clear  Cardiac - s1s2 regular, no murmur  Chest - clear no wheeze  Abdomen - soft, nontender  Extremities - no e/c/c  Neurologic - normal strength, CN intact  Skin -  no rashes  Psychiatric - normal mood, behavior    Assessment:          Plan:

## 2012-11-08 NOTE — Assessment & Plan Note (Addendum)
PFT 11/07/11>>FEV1 1.79 (102%), FEV1% 82, TLC 4.18 (104%), DLCO 69%, positive BD only in terms of fef 25/75  - hfa 50% p coaching 04/17/2012 > try symbicort 80 2bid> only 50% 08/03/2012 and `11/04/12  DDX of  difficult airways managment all start with A and  include Adherence, Ace Inhibitors, Acid Reflux, Active Sinus Disease, Alpha 1 Antitripsin deficiency, Anxiety masquerading as Airways dz,  ABPA,  allergy(esp in young), Aspiration (esp in elderly), Adverse effects of DPI,  Active smokers, plus two Bs  = Bronchiectasis and Beta blocker use..and one C= CHF  Adherence is always the initial "prime suspect" and is a multilayered concern that requires a "trust but verify" approach in every patient - starting with knowing how to use medications, especially inhalers, correctly, keeping up with refills and understanding the fundamental difference between maintenance and prns vs those medications only taken for a very short course and then stopped and not refilled.  The proper method of use, as well as anticipated side effects, of a metered-dose inhaler are discussed and demonstrated to the patient. Improved effectiveness after extensive coaching during this visit to a level of approximately  50%  In this case Adherence is the biggest issue and starts with  inability to use HFA effectively and also  understand that SABA treats the symptoms but doesn't get to the underlying problem (inflammation).  I used  the analogy of putting steroid cream on a rash to help explain the meaning of topical therapy and the need to get the drug to the target tissue.    Next step is use a trust but verify approach.  To keep things simple, I have asked the patient to first separate medicines that are perceived as maintenance, that is to be taken daily "no matter what", from those medicines that are taken on only on an as-needed basis and I have given the patient examples of both, and then return to see our NP to generate a  detailed   medication calendar which should be followed until the next physician sees the patient and updates it.

## 2012-11-17 ENCOUNTER — Ambulatory Visit: Payer: Medicare Other | Admitting: Adult Health

## 2012-11-23 ENCOUNTER — Ambulatory Visit (INDEPENDENT_AMBULATORY_CARE_PROVIDER_SITE_OTHER): Payer: Medicare Other | Admitting: Adult Health

## 2012-11-23 ENCOUNTER — Encounter: Payer: Self-pay | Admitting: Adult Health

## 2012-11-23 ENCOUNTER — Ambulatory Visit (INDEPENDENT_AMBULATORY_CARE_PROVIDER_SITE_OTHER)
Admission: RE | Admit: 2012-11-23 | Discharge: 2012-11-23 | Disposition: A | Discharge status: Home / Self Care | Payer: Medicare Other | Source: Ambulatory Visit | Attending: Adult Health | Admitting: Adult Health

## 2012-11-23 VITALS — BP 122/60 | HR 98 | Temp 98.2°F | Ht 60.0 in | Wt 173.2 lb

## 2012-11-23 DIAGNOSIS — J45991 Cough variant asthma: Secondary | ICD-10-CM

## 2012-11-23 DIAGNOSIS — R05 Cough: Secondary | ICD-10-CM | POA: Diagnosis not present

## 2012-11-23 DIAGNOSIS — R059 Cough, unspecified: Secondary | ICD-10-CM | POA: Diagnosis not present

## 2012-11-23 MED ORDER — HYDROCODONE-HOMATROPINE 5-1.5 MG/5ML PO SYRP: 5.0000 mL | ORAL_SOLUTION | ORAL | Status: DC | PRN

## 2012-11-23 MED ORDER — NEBIVOLOL HCL 5 MG PO TABS: 5.0000 mg | ORAL_TABLET | Freq: Every day | ORAL | Status: DC

## 2012-11-23 MED ORDER — AZITHROMYCIN 250 MG PO TABS: ORAL_TABLET | ORAL | Status: AC

## 2012-11-23 MED ORDER — HYDROCODONE-HOMATROPINE 5-1.5 MG/5ML PO SYRP: ORAL_SOLUTION | ORAL | Status: DC

## 2012-11-23 NOTE — Addendum Note (Signed)
Addended by: Boone Master E on: 11/23/2012 04:50 PM   Modules accepted: Orders

## 2012-11-23 NOTE — Patient Instructions (Addendum)
Zpack take as directed.  Brush/rinse and gargle after inhaler use.  Follow med calendar closely and bring to each visit.  follow up Dr. Sherene Sires  In 3- months and As needed   I will call with xray results.

## 2012-11-23 NOTE — Addendum Note (Signed)
Addended by: Boone Master E on: 11/23/2012 05:42 PM   Modules accepted: Orders

## 2012-11-23 NOTE — Assessment & Plan Note (Signed)
Upper airway cough with notable slow to resolve flare. It appears patient has ongoing upper respiratory, infection. We'll treat briefly with antibiotics. Check chest x-ray today. Patient's medications were reviewed today and patient education was given. Computerized medication calendar was adjusted/completed  Plan  Zpack take as directed.  Brush/rinse and gargle after inhaler use.  Follow med calendar closely and bring to each visit.  follow up Dr. Sherene Sires  In 3- months and As needed   I will call with xray results.

## 2012-11-23 NOTE — Progress Notes (Signed)
Subjective:     Patient ID: Heidi Franklin, female   DOB: Sep 01, 1946, 67 y.o.   MRN: [id number]  HPI History of Present Illness:  67 yobf never smoker with chronic cough  since age 67's only resolves for a week at a time then recurs daily,  dx as  asthma, post-nasal drip with nasal septal deviation, and GERD.  04/17/2012 Acute ov/Wert   cc  Cough only about ever 50% improved at baseline then much worse x 5 days,  most notable after lie down but  also during the day,  Producing minimal clear mucus on mucinex - not able to use qvar effectively and having to use lots of saba which helps some but prednisone helps the most.   no assoc overt sinus or reflux symptoms >>added PPI/Pepcid and changed from qvar to symbicort   05/05/2012 Follow up and med review Returns for follow up and med review  Reviewed all her meds and organized them into a med calendar with pt education  Appears to be taking correctly.  Improved from last ov -started on Symbicort and GERD regimen.  Tolerating well.  Decreased dyspnea and wheezing.  No chest pain or edema.  rec Continue on current regimen .  Brush/rinse and gargle after inhaler use.  Follow med calendar closely and bring to each visit.  follow up Dr. Sherene Sires  In 3-4 months and As needed     08/03/2012 f/u ov/Wert cc problems for decades, better on present rx than for many years prior to pulmonary clinic changes, not using calendar, poor hfa  technique   but no limiting sob.  rec Change zyrtec to where you take it only if needed for itching/ sneezing  Work on inhaler technique:      Ok to use symbicort with spacer if not improving   11/04/2012 f/u ov/Wert did not bring calendar and says cough only a little better really unchanged for years x a after prednisone, still with poor hfa technique and using saba twice daily, no limiting sob. >steroid taper   11/23/2012 Follow up and Med calendar    Patient returns for a two-week followup and medication review. We reviewed  all her medications and organized them into a medication calendar with patient education. It appears the patient is taking her medications correctly. Last visit. Patient was given a steroid taper for flare of her cough. She was also changed from atenolol to bystolic  She says that her cough got better briefly but never totally went away, and over the last 3-4 days. Cough has flared back with intermittent congestion, hoarseness, and, harsh, barking cough at times. She denies any fever or hemoptysis, orthopnea, PND, or leg swelling      Review of Systems Constitutional:   No  weight loss, night sweats,  Fevers, chills,  +fatigue, or  lassitude.  HEENT:   No headaches,  Difficulty swallowing,  Tooth/dental problems, or  Sore throat,                No sneezing, itching, ear ache,  +nasal congestion, post nasal drip,   CV:  No chest pain,  Orthopnea, PND, swelling in lower extremities, anasarca, dizziness, palpitations, syncope.   GI  No heartburn, indigestion, abdominal pain, nausea, vomiting, diarrhea, change in bowel habits, loss of appetite, bloody stools.   Resp:   No coughing up of blood.    No chest wall deformity  Skin: no rash or lesions.  GU: no dysuria, change in color of urine, no urgency  or frequency.  No flank pain, no hematuria   MS:  No joint pain or swelling.  No decreased range of motion.  No back pain.  Psych:  No change in mood or affect. No depression or anxiety.  No memory loss.         Objective:   Physical Exam Exam:  GEN: A/Ox3; pleasant , NAD, well nourished   HEENT:  [place]/AT,  EACs-clear, TMs-wnl, NOSE-clear drainage  THROAT-clear, no lesions, no postnasal drip or exudate noted.   NECK:  Supple w/ fair ROM; no JVD; normal carotid impulses w/o bruits; no thyromegaly or nodules palpated; no lymphadenopathy.  RESP  Clear  P & A; w/o, wheezes/ rales/ or rhonchi.no accessory muscle use, no dullness to percussion  CARD:  RRR, no m/r/g  , no peripheral  edema, pulses intact, no cyanosis or clubbing.  GI:   Soft & nt; nml bowel sounds; no organomegaly or masses detected.  Musco: Warm bil, no deformities or joint swelling noted.   Neuro: alert, no focal deficits noted.    Skin: Warm, no lesions or rashes     Assessment:          Plan:

## 2012-11-24 NOTE — Progress Notes (Signed)
Quick Note:  Called spoke with patient, advised of cxr results / recs as stated by TP. Pt verbalized her understanding and denied any questions. ______ 

## 2012-12-02 NOTE — Addendum Note (Signed)
Addended by: Boone Master E on: 12/02/2012 01:25 PM   Modules accepted: Orders

## 2013-01-04 ENCOUNTER — Other Ambulatory Visit: Payer: Self-pay | Admitting: *Deleted

## 2013-01-04 MED ORDER — FLUTICASONE PROPIONATE 50 MCG/ACT NA SUSP: 2.0000 | Freq: Two times a day (BID) | NASAL | Status: DC

## 2013-01-05 ENCOUNTER — Other Ambulatory Visit: Payer: Self-pay | Admitting: *Deleted

## 2013-01-05 MED ORDER — FLUTICASONE PROPIONATE 50 MCG/ACT NA SUSP: 1.0000 | Freq: Two times a day (BID) | NASAL | Status: DC

## 2013-01-05 NOTE — Telephone Encounter (Signed)
Received fax stating fluticasone 2 spray nasally BID for a total of 400 mcg. Maximum recommend dosage is 200 mcg intranasally. Pls clarify directions. Sending new rx for 1 spray BID...Raechel Chute

## 2013-01-20 ENCOUNTER — Ambulatory Visit (INDEPENDENT_AMBULATORY_CARE_PROVIDER_SITE_OTHER): Payer: Medicare Other | Admitting: Internal Medicine

## 2013-01-20 ENCOUNTER — Encounter: Payer: Self-pay | Admitting: Internal Medicine

## 2013-01-20 VITALS — BP 132/62 | HR 73 | Temp 98.4°F | Wt 172.8 lb

## 2013-01-20 DIAGNOSIS — L508 Other urticaria: Secondary | ICD-10-CM | POA: Diagnosis not present

## 2013-01-20 MED ORDER — METHYLPREDNISOLONE ACETATE 80 MG/ML IJ SUSP
80.0000 mg | Freq: Once | INTRAMUSCULAR | Status: AC
  Administered 2013-01-20: 80 mg via INTRAMUSCULAR

## 2013-01-20 NOTE — Progress Notes (Signed)
  Subjective:    Patient ID: Heidi Franklin, female    DOB: October 21, 1946, 67 y.o.   MRN: [id number]  HPI  Here for follow up -complains of facial itch and redness  Past Medical History  Diagnosis Date  . BRONCHITIS, CHRONIC   . ALLERGIC RHINITIS   . Anxiety state, unspecified   . DEPRESSION   . DIABETES MELLITUS, TYPE II   . GERD   . HYPERLIPIDEMIA   . HYPERTENSION     Review of Systems  Constitutional: Positive for fatigue. Negative for fever and unexpected weight change.  Respiratory: Negative for cough and shortness of breath.   Cardiovascular: Negative for chest pain and palpitations.  Musculoskeletal: Negative for back pain.  Skin: Positive for rash.       Objective:   Physical Exam  BP 132/62  Pulse 73  Temp(Src) 98.4 F (36.9 C) (Oral)  Wt 172 lb 12.8 oz (78.382 kg)  BMI 33.75 kg/m2  SpO2 96% Wt Readings from Last 3 Encounters:  01/20/13 172 lb 12.8 oz (78.382 kg)  11/23/12 173 lb 3.2 oz (78.563 kg)  11/04/12 173 lb 3.2 oz (78.563 kg)   Constitutional: She is overweight, but appears well-developed and well-nourished. No distress.  Neck: Normal range of motion. Neck supple. No JVD present. No thyromegaly present.  Cardiovascular: Normal rate, regular rhythm and normal heart sounds.  No murmur heard. No BLE edema. Pulmonary/Chest: Effort normal and breath sounds normal. No respiratory distress. She has no wheezes. Skin - eczema like fine urticaria diffusely involving face, no pustules, nodules or edema  Lab Results  Component Value Date   WBC 10.9* 06/10/2012   HGB 12.2 06/10/2012   HCT 38.5 06/10/2012   PLT 272.0 06/10/2012   GLUCOSE 107* 06/10/2012   CHOL 183 06/10/2012   TRIG 162.0* 06/10/2012   HDL 39.20 06/10/2012   LDLCALC 111* 06/10/2012   ALT 26 06/10/2012   AST 32 06/10/2012   NA 141 06/10/2012   K 4.1 06/10/2012   CL 103 06/10/2012   CREATININE 0.7 06/10/2012   BUN 14 06/10/2012   CO2 29 06/10/2012   TSH 1.81 06/10/2012   HGBA1C 7.0* 10/13/2012   MICROALBUR 0.7 03/02/2010        Assessment & Plan:  See problem list. Medications and labs reviewed today.  Facial urticaria - hx eczema - unclear trigger -  IM medrol 80 today Refer to derm -  Advised to use gentle face cleanser and moisturizing face lotion only pending derm eval

## 2013-01-20 NOTE — Patient Instructions (Signed)
It was good to see you today. Medrol steroid shot given to you today for facial rash we'll make referral to  Dr. Terri Piedra or other dermatologist. Our office will contact you regarding appointment(s) once made. Until this evaluation, use only gentle face cleansers such as well as simple moisturizing face lotion Keep followup as scheduled next month for diabetes review, please call sooner if problem

## 2013-02-01 DIAGNOSIS — L259 Unspecified contact dermatitis, unspecified cause: Secondary | ICD-10-CM | POA: Diagnosis not present

## 2013-02-08 ENCOUNTER — Other Ambulatory Visit: Payer: Self-pay | Admitting: Internal Medicine

## 2013-02-08 NOTE — Telephone Encounter (Signed)
Faxed script back to [facility]...lmb 

## 2013-02-09 ENCOUNTER — Other Ambulatory Visit (INDEPENDENT_AMBULATORY_CARE_PROVIDER_SITE_OTHER): Payer: Medicare Other

## 2013-02-09 ENCOUNTER — Encounter: Payer: Self-pay | Admitting: Internal Medicine

## 2013-02-09 ENCOUNTER — Telehealth: Payer: Self-pay | Admitting: *Deleted

## 2013-02-09 ENCOUNTER — Ambulatory Visit (INDEPENDENT_AMBULATORY_CARE_PROVIDER_SITE_OTHER): Payer: Medicare Other | Admitting: Internal Medicine

## 2013-02-09 VITALS — BP 130/62 | HR 96 | Temp 99.2°F | Wt 167.6 lb

## 2013-02-09 DIAGNOSIS — E785 Hyperlipidemia, unspecified: Secondary | ICD-10-CM

## 2013-02-09 DIAGNOSIS — E1149 Type 2 diabetes mellitus with other diabetic neurological complication: Secondary | ICD-10-CM | POA: Diagnosis not present

## 2013-02-09 DIAGNOSIS — I10 Essential (primary) hypertension: Secondary | ICD-10-CM

## 2013-02-09 LAB — LIPID PANEL
Cholesterol: 216 mg/dL — ABNORMAL HIGH (ref 0–200)
HDL: 38.1 mg/dL — ABNORMAL LOW (ref >39.00)
Total CHOL/HDL Ratio: 6
Triglycerides: 98 mg/dL (ref 0.0–149.0)
VLDL: 19.6 mg/dL (ref 0.0–40.0)

## 2013-02-09 LAB — HEMOGLOBIN A1C: Hgb A1c MFr Bld: 6.6 % — ABNORMAL HIGH (ref 4.6–6.5)

## 2013-02-09 LAB — MICROALBUMIN / CREATININE URINE RATIO
Creatinine,U: 207.4 mg/dL
Microalb Creat Ratio: 0.8 mg/g (ref 0.0–30.0)
Microalb, Ur: 1.7 mg/dL (ref 0.0–1.9)

## 2013-02-09 LAB — LDL CHOLESTEROL, DIRECT: Direct LDL: 155.1 mg/dL

## 2013-02-09 MED ORDER — LOVASTATIN 20 MG PO TABS: 20.0000 mg | ORAL_TABLET | Freq: Every day | ORAL | Status: DC

## 2013-02-09 NOTE — Assessment & Plan Note (Signed)
On metformin; also ARB, ASA 81 - declines statin but may reconsider given spouse MI 02/2012 reports home cbgs "ok" -130s Check a1c now, adjust if needed Lab Results  Component Value Date   HGBA1C 7.0* 10/13/2012

## 2013-02-09 NOTE — Assessment & Plan Note (Addendum)
increased amlodipine dose 02/2011 -  Added atenolol 09/2012 cont max ARB/hctz Encouraged to work on lifestyle control and weight loss BP Readings from Last 3 Encounters:  02/09/13 130/62  01/20/13 132/62  11/23/12 122/60

## 2013-02-09 NOTE — Progress Notes (Signed)
  Subjective:    Patient ID: Heidi Franklin, female    DOB: 1946-06-15, 67 y.o.   MRN: [id number]  HPI  Here for follow up - reviewed chronic medical issues today:  hypertension - reports compliance with ongoing medical treatment and no changes in medication dose or frequency. denies adverse side effects related to current therapy.   dyslipidemia - previously declined statin - reports working on diet/exercise  DM2 - reports compliance with ongoing medical treatment and no changes in medication dose or frequency. denies adverse side effects related to current therapy.  home cbgs 130-140s, checks sugars few times each week - no signs or symptoms hypoglycemia  chronic bronchitis and allergies, working with pulm for same. Ongoing seasonal flare: cough remains dry - no mucus or fever or sore throat, chronic mild hoarseness from nasal drainage - no smoking hx -  GERD symptoms - on omeprazole qd with improvement, but intermittent use. Denies adverse side effects.  Anxiety, exac by spouse illness 02/2012 (AA surg, MI) - takes BZ prn and started sertraline summer 2012 - the patient reports compliance with medication(s) as prescribed. Denies adverse side effects.   Trigger finger s/p cortisone injection 05/31/11 - less popping in L 4th finger, much improved  Past Medical History  Diagnosis Date  . BRONCHITIS, CHRONIC   . ALLERGIC RHINITIS   . Anxiety state, unspecified   . DEPRESSION   . DIABETES MELLITUS, TYPE II   . GERD   . HYPERLIPIDEMIA   . HYPERTENSION     Review of Systems  Constitutional: Positive for fatigue. Negative for fever and unexpected weight change.  Respiratory: Positive for cough. Negative for shortness of breath.   Cardiovascular: Negative for chest pain and palpitations.  Musculoskeletal: Negative for back pain.       Objective:   Physical Exam  BP 130/62  Pulse 96  Temp(Src) 99.2 F (37.3 C) (Oral)  Wt 167 lb 9.6 oz (76.023 kg)  BMI 32.73 kg/m2  SpO2 95% Wt  Readings from Last 3 Encounters:  02/09/13 167 lb 9.6 oz (76.023 kg)  01/20/13 172 lb 12.8 oz (78.382 kg)  11/23/12 173 lb 3.2 oz (78.563 kg)   Constitutional: She is overweight, but appears well-developed and well-nourished. No distress.  Neck: Normal range of motion. Neck supple. No JVD present. No thyromegaly present.  Cardiovascular: Normal rate, regular rhythm and normal heart sounds.  No murmur heard. No BLE edema. Pulmonary/Chest: Effort normal and breath sounds normal. No respiratory distress. She has no wheezes.  Psychiatric: She has a mildly anxious mood and affect. Her behavior is normal. Judgment and thought content normal.    Lab Results  Component Value Date   WBC 10.9* 06/10/2012   HGB 12.2 06/10/2012   HCT 38.5 06/10/2012   PLT 272.0 06/10/2012   GLUCOSE 107* 06/10/2012   CHOL 183 06/10/2012   TRIG 162.0* 06/10/2012   HDL 39.20 06/10/2012   LDLCALC 111* 06/10/2012   ALT 26 06/10/2012   AST 32 06/10/2012   NA 141 06/10/2012   K 4.1 06/10/2012   CL 103 06/10/2012   CREATININE 0.7 06/10/2012   BUN 14 06/10/2012   CO2 29 06/10/2012   TSH 1.81 06/10/2012   HGBA1C 7.0* 10/13/2012   MICROALBUR 0.7 03/02/2010        Assessment & Plan:  See problem list. Medications and labs reviewed today.

## 2013-02-09 NOTE — Patient Instructions (Signed)
It was good to see you today. Refill on medication(s) as discussed today. Test(s) ordered today. Your results will be released to MyChart (or called to you) after review, usually within 72hours after test completion. If any changes need to be made, you will be notified at that same time. Medications reviewed and updated - start generic Mevacor for cholesterol to reduce your chances of heart attack or stroke Your prescription(s) have been submitted to your pharmacy. Please take as directed and contact our office if you believe you are having problem(s) with the medication(s). Please schedule followup in 4 months for review of diabetes mellitus and blood pressure, call sooner if problems.

## 2013-02-09 NOTE — Assessment & Plan Note (Signed)
Prev rx'd statins including simva and prava but pt has been afraid of potential side effects, noncompliance with prior rx due to same  - Pt willing to reconsider given spouse cardiac problems 02/2012  recheck now at pt request - but will also start low dose mevacor reviewed importance of good lipid control reviewed again with pt, esp given co-dz with diabetes mellitus + hypertension

## 2013-02-09 NOTE — Telephone Encounter (Signed)
Notified pt with lab results...Heidi Franklin

## 2013-02-25 ENCOUNTER — Ambulatory Visit: Payer: Medicare Other | Admitting: Internal Medicine

## 2013-03-04 ENCOUNTER — Encounter: Payer: Self-pay | Admitting: Internal Medicine

## 2013-03-04 ENCOUNTER — Ambulatory Visit (INDEPENDENT_AMBULATORY_CARE_PROVIDER_SITE_OTHER): Payer: Medicare Other | Admitting: Internal Medicine

## 2013-03-04 VITALS — BP 126/70 | HR 82 | Temp 98.8°F | Ht 60.0 in | Wt 171.0 lb

## 2013-03-04 DIAGNOSIS — J45991 Cough variant asthma: Secondary | ICD-10-CM | POA: Diagnosis not present

## 2013-03-04 MED ORDER — PREDNISONE (PAK) 10 MG PO TABS: ORAL_TABLET | ORAL | Status: DC

## 2013-03-04 NOTE — Progress Notes (Signed)
Subjective:     Patient ID: Heidi Franklin, female   DOB: 06/09/1946    MRN: [id number]  HPI History of Present Illness:  67 yobf never smoker with chronic cough  since age 67's only resolves for a week at a time then recurs daily,  dx as  asthma, post-nasal drip with nasal septal deviation, and GERD.  04/17/2012 Acute ov/Wert   cc  Cough only about ever 50% improved at baseline then much worse x 5 days,  most notable after lie down but  also during the day,  Producing minimal clear mucus on mucinex - not able to use qvar effectively and having to use lots of saba which helps some but prednisone helps the most.   no assoc overt sinus or reflux symptoms >>added PPI/Pepcid and changed from qvar to symbicort   05/05/2012 Follow up and med review Returns for follow up and med review  Reviewed all her meds and organized them into a med calendar with pt education  Appears to be taking correctly.  Improved from last ov -started on Symbicort and GERD regimen.  Tolerating well.  Decreased dyspnea and wheezing.  No chest pain or edema.  rec Continue on current regimen .  Brush/rinse and gargle after inhaler use.  Follow med calendar closely and bring to each visit.  follow up Dr. Sherene Sires  In 3-4 months and As needed     08/03/2012 f/u ov/Wert cc problems for decades, better on present rx than for many years prior to pulmonary clinic changes, not using calendar, poor hfa  technique   but no limiting sob.  rec Change zyrtec to where you take it only if needed for itching/ sneezing  Work on inhaler technique:      Ok to use symbicort with spacer if not improving   11/04/2012 f/u ov/Wert did not bring calendar and says cough only a little better really unchanged for years x a after prednisone, still with poor hfa technique and using saba twice daily, no limiting sob. >steroid taper   11/23/2012 Follow up and Med calendar    Patient returns for a two-week followup and medication review. We reviewed all her  medications and organized them into a medication calendar with patient education. It appears the patient is taking her medications correctly. Last visit. Patient was given a steroid taper for flare of her cough. She was also changed from atenolol to bystolic  She says that her cough got better briefly but never totally went away, and over the last 3-4 days. Cough has flared back with intermittent congestion, hoarseness, and, harsh, barking cough at times. rec Try zyrtec > chlorpheniramine   03/04/2013 f/u ov/Wert re asthma with prominent cough Chief Complaint  Patient presents with  . Follow-up    Pt c/o increased cough x 2 wks- non prod and worse when she goes outside. Also, she is having increased SOB for the past wk   assoc with hoarsensess, day > night, using xopenex as rescue maybe twice daily? helps some  Has med calendar not really following action plan and unsure of names of meds including symbicort  No obvious daytime variabilty or assoc chronic cough or cp or chest tightness, subjective wheeze overt sinus or hb symptoms. No unusual exp hx or h/o childhood pna/ asthma or premature birth to his knowledge.  Sleeping ok without nocturnal  or early am exacerbation  of respiratory  c/o's or need for noct saba. Also denies any obvious fluctuation of symptoms with weather or  environmental changes or other aggravating or alleviating factors except as outlined above   Current Medications, Allergies, Past Medical History, Past Surgical History, Family History, and Social History were reviewed in [employer] record.  ROS  The following are not active complaints unless bolded sore throat, dysphagia, dental problems, itching, sneezing,  nasal congestion or excess/ purulent secretions, ear ache,   fever, chills, sweats, unintended wt loss, pleuritic or exertional cp, hemoptysis,  orthopnea pnd or leg swelling, presyncope, palpitations, heartburn, abdominal pain, anorexia,  nausea, vomiting, diarrhea  or change in bowel or urinary habits, change in stools or urine, dysuria,hematuria,  rash, arthralgias, visual complaints, headache, numbness weakness or ataxia or problems with walking or coordination,  change in mood/affect or memory.                    Objective:   Physical Exam Exam:  GEN: A/Ox3; pleasant , NAD, well nourished mod hoarse with nl vital signs Wt Readings from Last 3 Encounters:  03/04/13 171 lb (77.565 kg)  02/09/13 167 lb 9.6 oz (76.023 kg)  01/20/13 172 lb 12.8 oz (78.382 kg)     HEENT:  [place]/AT,  EACs-clear, TMs-wnl, NOSE-clear drainage  THROAT-clear, no lesions, no postnasal drip or exudate noted.   NECK:  Supple w/ fair ROM; no JVD; normal carotid impulses w/o bruits; no thyromegaly or nodules palpated; no lymphadenopathy.  RESP  Clear  P & A; w/o, wheezes/ rales/ or rhonchi.no accessory muscle use, no dullness to percussion  CARD:  RRR, no m/r/g  , no peripheral edema, pulses intact, no cyanosis or clubbing.  GI:   Soft & nt; nml bowel sounds; no organomegaly or masses detected.  Musco: Warm bil, no deformities or joint swelling noted.   Neuro: alert, no focal deficits noted.    Skin: Warm, no lesions or rashes     Assessment:          Plan:

## 2013-03-04 NOTE — Patient Instructions (Addendum)
Prednisone 10 mg take  4 each am x 2 days,   2 each am x 2 days,  1 each am x2days and stop   See [name] NP w/in 2 weeks with all your medications, even over the counter meds, separated in two separate bags, the ones you take no matter what vs the ones you stop once you feel better and take only as needed when you feel you need them.   [name]  will generate for you a new user friendly medication calendar that will put [place] all on the same page re: your medication use.     Without this process, it simply isn't possible to assure that we are providing  your outpatient care  with  the attention to detail we feel you deserve.   If we cannot assure that you're getting that kind of care,  then we cannot manage your problem effectively from this clinic.  Once you have seen [name] and we are sure that we're all on the same page with your medication use she will arrange follow up with me.

## 2013-03-07 NOTE — Assessment & Plan Note (Addendum)
PFT 11/07/11>>FEV1 1.79 (102%), FEV1% 82, TLC 4.18 (104%), DLCO 69%, positive BD only in terms of fef 25/75  - hfa75% 03/04/2013  -med calendar 11/23/2012   I had an extended discussion with the patient today lasting 15 to 20 minutes of a 25 minute visit on the following issues:   The standardized cough guidelines published in Chest by [place] in 2006 are still the best available and consist of a multiple step process (up to 12!) , not a single office visit,  and are intended  to address this problem logically,  with an alogrithm dependent on response to empiric treatment at  each progressive step  to determine a specific diagnosis with  minimal addtional testing needed. Therefore if adherence is an issue or can't be accurately verified,  it's very unlikely the standard evaluation and treatment will be successful here.    Furthermore, response to therapy (other than acute cough suppression, which should only be used short term with avoidance of narcotic containing cough syrups if possible), can be a gradual process for which the patient may perceive immediate benefit.  She has consistently failed to understand the concept of medication reconciliation and i doubt we'll be able to help her if we can't do this first and then work with her using the med calendar to try different empirical treatment options to arrive at accurate dx and management here.  See instructions for specific recommendations which were reviewed directly with the patient who was given a copy with highlighter outlining the key components.

## 2013-03-17 ENCOUNTER — Other Ambulatory Visit: Payer: Self-pay | Admitting: Internal Medicine

## 2013-03-18 ENCOUNTER — Ambulatory Visit (INDEPENDENT_AMBULATORY_CARE_PROVIDER_SITE_OTHER): Payer: Medicare Other | Admitting: Adult Health

## 2013-03-18 ENCOUNTER — Encounter: Payer: Self-pay | Admitting: Adult Health

## 2013-03-18 VITALS — BP 120/82 | HR 95 | Temp 98.7°F | Ht 60.0 in | Wt 171.2 lb

## 2013-03-18 DIAGNOSIS — J45991 Cough variant asthma: Secondary | ICD-10-CM

## 2013-03-18 NOTE — Patient Instructions (Addendum)
Brush/rinse and gargle after inhaler use.  Follow med calendar closely and bring to each visit.  follow up Dr. Sherene Sires  In 3- months and As needed

## 2013-03-18 NOTE — Addendum Note (Signed)
Addended by: Boone Master E on: 03/18/2013 04:12 PM   Modules accepted: Orders

## 2013-03-18 NOTE — Progress Notes (Signed)
Subjective:     Patient ID: Heidi Franklin, female   DOB: 19-Dec-1945    MRN: [id number]  HPI History of Present Illness:  30 yobf never smoker with chronic cough  since age 67's only resolves for a week at a time then recurs daily,  dx as  asthma, post-nasal drip with nasal septal deviation, and GERD.  04/17/2012 Acute ov/Wert   cc  Cough only about ever 50% improved at baseline then much worse x 5 days,  most notable after lie down but  also during the day,  Producing minimal clear mucus on mucinex - not able to use qvar effectively and having to use lots of saba which helps some but prednisone helps the most.   no assoc overt sinus or reflux symptoms >>added PPI/Pepcid and changed from qvar to symbicort   05/05/2012 Follow up and med review Returns for follow up and med review  Reviewed all her meds and organized them into a med calendar with pt education  Appears to be taking correctly.  Improved from last ov -started on Symbicort and GERD regimen.  Tolerating well.  Decreased dyspnea and wheezing.  No chest pain or edema.  rec Continue on current regimen .  Brush/rinse and gargle after inhaler use.  Follow med calendar closely and bring to each visit.  follow up Dr. Sherene Sires  In 3-4 months and As needed     08/03/2012 f/u ov/Wert cc problems for decades, better on present rx than for many years prior to pulmonary clinic changes, not using calendar, poor hfa  technique   but no limiting sob.  rec Change zyrtec to where you take it only if needed for itching/ sneezing  Work on inhaler technique:      Ok to use symbicort with spacer if not improving   11/04/2012 f/u ov/Wert did not bring calendar and says cough only a little better really unchanged for years x a after prednisone, still with poor hfa technique and using saba twice daily, no limiting sob. >steroid taper   11/23/2012 Follow up and Med calendar    Patient returns for a two-week followup and medication review. We reviewed all her  medications and organized them into a medication calendar with patient education. It appears the patient is taking her medications correctly. Last visit. Patient was given a steroid taper for flare of her cough. She was also changed from atenolol to bystolic  She says that her cough got better briefly but never totally went away, and over the last 3-4 days. Cough has flared back with intermittent congestion, hoarseness, and, harsh, barking cough at times. rec Try zyrtec > chlorpheniramine   03/04/2013 f/u ov/Wert re asthma with prominent cough Chief Complaint  Patient presents with  . Follow-up    Pt c/o increased cough x 2 wks- non prod and worse when she goes outside. Also, she is having increased SOB for the past wk   assoc with hoarsensess, day > night, using xopenex as rescue maybe twice daily? helps some  Has med calendar not really following action plan and unsure of names of meds including symbicort >>pred taper    03/18/2013 Follow up and med review  Patient returns for a two-week followup and medication review. We reviewed all her medications and organized them into a medication calendar with patient education. It appears the patient is taking her medications correctly. Last visit. Patient was given a steroid taper for flare of her cough. Cough is better.  Never goes entirely away,  has tickle in throat on /off.        Current Medications, Allergies, Past Medical History, Past Surgical History, Family History, and Social History were reviewed in [employer] record.  ROS  The following are not active complaints unless bolded sore throat, dysphagia, dental problems, itching, sneezing,  nasal congestion or excess/ purulent secretions, ear ache,   fever, chills, sweats, unintended wt loss, pleuritic or exertional cp, hemoptysis,  orthopnea pnd or leg swelling, presyncope, palpitations, heartburn, abdominal pain, anorexia, nausea, vomiting, diarrhea  or  change in bowel or urinary habits, change in stools or urine, dysuria,hematuria,  rash, arthralgias, visual complaints, headache, numbness weakness or ataxia or problems with walking or coordination,  change in mood/affect or memory.                    Objective:   Physical Exam Exam:  GEN: A/Ox3; pleasant , NAD, well nourished mod hoarse with nl vital signs Wt Readings from Last 3 Encounters:  03/04/13 171 lb (77.565 kg)  02/09/13 167 lb 9.6 oz (76.023 kg)  01/20/13 172 lb 12.8 oz (78.382 kg)     HEENT:  [place]/AT,  EACs-clear, TMs-wnl, NOSE-clear drainage  THROAT-clear, no lesions, no postnasal drip or exudate noted.   NECK:  Supple w/ fair ROM; no JVD; normal carotid impulses w/o bruits; no thyromegaly or nodules palpated; no lymphadenopathy.  RESP  Clear  P & A; w/o, wheezes/ rales/ or rhonchi.no accessory muscle use, no dullness to percussion  CARD:  RRR, no m/r/g  , no peripheral edema, pulses intact, no cyanosis or clubbing.  GI:   Soft & nt; nml bowel sounds; no organomegaly or masses detected.  Musco: Warm bil, no deformities or joint swelling noted.   Neuro: alert, no focal deficits noted.    Skin: Warm, no lesions or rashes     Assessment:          Plan:

## 2013-03-18 NOTE — Assessment & Plan Note (Addendum)
Recent flare now resolved  improved compensation   Plan  Brush/rinse and gargle after inhaler use.  Follow med calendar closely and bring to each visit.  follow up Dr. Sherene Sires  In 3- months and As needed

## 2013-03-26 DIAGNOSIS — L909 Atrophic disorder of skin, unspecified: Secondary | ICD-10-CM | POA: Diagnosis not present

## 2013-03-26 DIAGNOSIS — L919 Hypertrophic disorder of the skin, unspecified: Secondary | ICD-10-CM | POA: Diagnosis not present

## 2013-03-26 DIAGNOSIS — D485 Neoplasm of uncertain behavior of skin: Secondary | ICD-10-CM | POA: Diagnosis not present

## 2013-04-09 ENCOUNTER — Telehealth: Payer: Self-pay | Admitting: Internal Medicine

## 2013-04-09 NOTE — Telephone Encounter (Signed)
lmomtcb  

## 2013-04-09 NOTE — Telephone Encounter (Signed)
Pt called back and she stated that she is out of the symbicort but she is not able to afford this.   Pt stated that MW gave her the rx for the bystolic but this is also too expensive and is requesting something in generic form.  She stated that her insurance company sent her a letter of what they will cover but she cannot find this form at this time.  She will find this over the weekend and will call back on  Monday.  Will hold in triage until the pt calls back on Monday.

## 2013-04-12 MED ORDER — METOPROLOL SUCCINATE ER 50 MG PO TB24: 50.0000 mg | ORAL_TABLET | Freq: Every day | ORAL | Status: DC

## 2013-04-12 NOTE — Telephone Encounter (Signed)
Pt aware of medication change--aware will call into pharm to p/u  Dr Sherene Sires, which Metoprolol 50mg  do you rec we send in? Regular (Lopressor/Met) 50mg  strength or (Toprol/Met) XL 50mg ? Please advise. Thanks.

## 2013-04-12 NOTE — Telephone Encounter (Signed)
RX has been sent.

## 2013-04-12 NOTE — Telephone Encounter (Signed)
Ok to substitute metaprolol 50 mg daily for bystolic 5

## 2013-04-12 NOTE — Telephone Encounter (Signed)
Pt called back. She stated her insurance advised her metoprolol tartrate. Please advise Dr. Sherene Sires thanks

## 2013-04-12 NOTE — Telephone Encounter (Signed)
lmomtcb x1 

## 2013-04-12 NOTE — Telephone Encounter (Signed)
XL form

## 2013-04-12 NOTE — Telephone Encounter (Signed)
Patient cannot find paperwork in regards to Preferred Medications from her insurance company for [employer]. Medication advised to call her insurance company and ask them for the preferred list.  Patient to call back with response. Patient aware that we do not have any samples of Symbicort 80 to give her. Pt also going to fill out paperwork for patient assistance---pt aware that I am going to mail paperwork to her home address.   Will hold in triage until the pt calls back about Bystolic.

## 2013-05-20 ENCOUNTER — Other Ambulatory Visit: Payer: Self-pay | Admitting: *Deleted

## 2013-05-20 MED ORDER — METFORMIN HCL 500 MG PO TABS: 500.0000 mg | ORAL_TABLET | Freq: Two times a day (BID) | ORAL | Status: DC

## 2013-05-27 ENCOUNTER — Other Ambulatory Visit: Payer: Self-pay | Admitting: Internal Medicine

## 2013-05-27 NOTE — Telephone Encounter (Signed)
Okay for Zoloft refill?

## 2013-05-28 ENCOUNTER — Other Ambulatory Visit: Payer: Self-pay

## 2013-05-28 DIAGNOSIS — I10 Essential (primary) hypertension: Secondary | ICD-10-CM

## 2013-05-28 MED ORDER — LOSARTAN POTASSIUM-HCTZ 100-25 MG PO TABS: 1.0000 | ORAL_TABLET | Freq: Every day | ORAL | Status: DC

## 2013-05-28 MED ORDER — AMLODIPINE BESYLATE 10 MG PO TABS: 10.0000 mg | ORAL_TABLET | Freq: Every day | ORAL | Status: DC

## 2013-06-02 DIAGNOSIS — Q828 Other specified congenital malformations of skin: Secondary | ICD-10-CM | POA: Diagnosis not present

## 2013-06-16 ENCOUNTER — Ambulatory Visit (INDEPENDENT_AMBULATORY_CARE_PROVIDER_SITE_OTHER): Payer: Medicare Other | Admitting: Internal Medicine

## 2013-06-16 ENCOUNTER — Encounter: Payer: Self-pay | Admitting: Internal Medicine

## 2013-06-16 VITALS — BP 110/72 | HR 84 | Temp 97.8°F | Ht 60.0 in | Wt 175.0 lb

## 2013-06-16 DIAGNOSIS — J45991 Cough variant asthma: Secondary | ICD-10-CM

## 2013-06-16 DIAGNOSIS — J310 Chronic rhinitis: Secondary | ICD-10-CM

## 2013-06-16 MED ORDER — LEVALBUTEROL TARTRATE 45 MCG/ACT IN AERO: 2.0000 | INHALATION_SPRAY | RESPIRATORY_TRACT | Status: DC | PRN

## 2013-06-16 NOTE — Progress Notes (Signed)
Subjective:     Patient ID: Heidi Franklin, female   DOB: 1946/10/17    MRN: [id number]  Brief patient profile:   5 yobf never smoker with chronic cough  since age 67's only resolves for a week at a time then recurs daily,  dx as  asthma, post-nasal drip with nasal septal deviation, and GERD.   HPI 04/17/2012 Acute ov/Heidi Franklin   cc  Cough only about ever 50% improved at baseline then much worse x 5 days,  most notable after lie down but  also during the day,  Producing minimal clear mucus on mucinex - not able to use qvar effectively and having to use lots of saba which helps some but prednisone helps the most.   no assoc overt sinus or reflux symptoms >>added PPI/Pepcid and changed from qvar to symbicort   05/05/2012 Follow up and med review Returns for follow up and med review  Reviewed all her meds and organized them into a med calendar with pt education  Appears to be taking correctly.  Improved from last ov -started on Symbicort and GERD regimen.  Tolerating well.  Decreased dyspnea and wheezing.  No chest pain or edema.  rec Continue on current regimen .  Brush/rinse and gargle after inhaler use.  Follow med calendar closely and bring to each visit.  follow up Dr. Melvyn Novas  In 3-4 months and As needed     08/03/2012 f/u ov/Heidi Franklin cc problems for decades, better on present rx than for many years prior to pulmonary clinic changes, not using calendar, poor hfa  technique   but no limiting sob.  rec Change zyrtec to where you take it only if needed for itching/ sneezing  Work on inhaler technique:      Ok to use symbicort with spacer if not improving   11/04/2012 f/u ov/Heidi Franklin did not bring calendar and says cough only a little better really unchanged for years x a after prednisone, still with poor hfa technique and using saba twice daily, no limiting sob. >steroid taper   11/23/2012 Follow up and Med calendar    Patient returns for a two-week followup and medication review. We reviewed all her  medications and organized them into a medication calendar with patient education. It appears the patient is taking her medications correctly. Last visit. Patient was given a steroid taper for flare of her cough. She was also changed from atenolol to bystolic  She says that her cough got better briefly but never totally went away, and over the last 3-4 days. Cough has flared back with intermittent congestion, hoarseness, and, harsh, barking cough at times. rec Try zyrtec > chlorpheniramine   03/04/2013 f/u ov/Heidi Franklin re asthma with prominent cough Chief Complaint  Patient presents with  . Follow-up    Pt c/o increased cough x 2 wks- non prod and worse when she goes outside. Also, she is having increased SOB for the past wk   assoc with hoarsensess, day > night, using xopenex as rescue maybe twice daily? helps some  Has med calendar not really following action plan and unsure of names of meds including symbicort >>pred taper    03/18/2013 Follow up and med review  Patient returns for a two-week followup and medication review. We reviewed all her medications and organized them into a medication calendar with patient education. It appears the patient is taking her medications correctly. Last visit. Patient was given a steroid taper for flare of her cough. Cough is better.  Never goes entirely  away, has tickle in throat on /off.  rec No change rx use med calendar   06/16/2013 f/u ov/Heidi Franklin  Chief Complaint  Patient presents with  . Follow-up    States that cough is slightly improved.   overall much better no recent attacks but still sensation drainage even on prednisone on prn chlortrimeton more day than night but no purulent secretions. No sob or need for albuterol prn  No obvious daytime variabilty or assoc chronic cough or cp or chest tightness, subjective wheeze overt sinus or hb symptoms. No unusual exp hx or h/o childhood pna/ asthma or knowledge of premature birth.   Sleeping ok  without nocturnal  or early am exacerbation  of respiratory  c/o's or need for noct saba. Also denies any obvious fluctuation of symptoms with weather or environmental changes or other aggravating or alleviating factors except as outlined above         Current Medications, Allergies, Past Medical History, Past Surgical History, Family History, and Social History were reviewed in [employer] record.  ROS  The following are not active complaints unless bolded sore throat, dysphagia, dental problems, itching, sneezing,  nasal congestion or excess/ purulent secretions, ear ache,   fever, chills, sweats, unintended wt loss, pleuritic or exertional cp, hemoptysis,  orthopnea pnd or leg swelling, presyncope, palpitations, heartburn, abdominal pain, anorexia, nausea, vomiting, diarrhea  or change in bowel or urinary habits, change in stools or urine, dysuria,hematuria,  rash, arthralgias, visual complaints, headache, numbness weakness or ataxia or problems with walking or coordination,  change in mood/affect or memory.                    Objective:   Physical Exam Exam:  GEN: A/Ox3; pleasant , NAD, well nad mod hoarse amb bf nad  Wt Readings from Last 3 Encounters:  06/16/13 175 lb (79.379 kg)  03/18/13 171 lb 3.2 oz (77.656 kg)  03/04/13 171 lb (77.565 kg)       HEENT:  [place]/AT,  EACs-clear, TMs-wnl, NOSE-clear drainage  THROAT-clear, no lesions, no postnasal drip or exudate noted.   NECK:  Supple w/ fair ROM; no JVD; normal carotid impulses w/o bruits; no thyromegaly or nodules palpated; no lymphadenopathy.  RESP  Clear  P & A; w/o, wheezes/ rales/ or rhonchi.no accessory muscle use, no dullness to percussion  CARD:  RRR, no m/r/g  , no peripheral edema, pulses intact, no cyanosis or clubbing.  GI:   Soft & nt; nml bowel sounds; no organomegaly or masses detected.  Musco: Warm bil, no deformities or joint swelling noted.   Neuro: alert, no focal  deficits noted.    Skin: Warm, no lesions or rashes     Assessment:

## 2013-06-16 NOTE — Patient Instructions (Addendum)
Change chlortrimeton to 4 mg one at bedtime plus use it during the day as needed for drainage   See calendar for specific medication instructions and bring it back for each and every office visit for every healthcare provider you see.  Without it,  you may not receive the best quality medical care that we feel you deserve.  You will note that the calendar groups together  your maintenance  medications that are timed at particular times of the day.  Think of this as your checklist for what your doctor has instructed you to do until your next evaluation to see what benefit  there is  to staying on a consistent group of medications intended to keep you well.  The other group at the bottom is entirely up to you to use as you see fit  for specific symptoms that may arise between visits that require you to treat them on an as needed basis.  Think of this as your action plan or "what if" list.   Separating the top medications from the bottom group is fundamental to providing you adequate care going forward.    Please schedule a follow up visit in 3 months but call sooner if needed

## 2013-06-17 NOTE — Assessment & Plan Note (Signed)
CT sinus 11/08/11>>Negative paranasal sinuses.  Rightward nasal septal deviation and possible rhinitis. 01/13/12>>RAST negative, IgE 22.7  Reviewed use of 1st gen h1 if tolerates sedating effects   See instructions for specific recommendations which were reviewed directly with the patient who was given a copy with highlighter outlining the key components.

## 2013-06-17 NOTE — Assessment & Plan Note (Signed)
PFT 11/07/11>>FEV1 1.79 (102%), FEV1% 82, TLC 4.18 (104%), DLCO 69%, positive BD only in terms of fef 25/75  - hfa75% 03/04/2013  -med calendar 11/23/2012 , 03/18/2013    Much better control on present rx, albeit complex    Each maintenance medication was reviewed in detail including most importantly the difference between maintenance and as needed and under what circumstances the prns are to be used. This was done in the context of a medication calendar review which provided the patient with a user-friendly unambiguous mechanism for medication administration and reconciliation and provides an action plan for all active problems. It is critical that this be shown to every doctor  for modification during the office visit if necessary so the patient can use it as a working document.

## 2013-06-23 ENCOUNTER — Other Ambulatory Visit (INDEPENDENT_AMBULATORY_CARE_PROVIDER_SITE_OTHER): Payer: Medicare Other

## 2013-06-23 ENCOUNTER — Ambulatory Visit (INDEPENDENT_AMBULATORY_CARE_PROVIDER_SITE_OTHER): Payer: Medicare Other | Admitting: Internal Medicine

## 2013-06-23 ENCOUNTER — Encounter: Payer: Self-pay | Admitting: Internal Medicine

## 2013-06-23 VITALS — BP 122/70 | HR 95 | Temp 98.5°F | Wt 173.6 lb

## 2013-06-23 DIAGNOSIS — E785 Hyperlipidemia, unspecified: Secondary | ICD-10-CM

## 2013-06-23 DIAGNOSIS — M5430 Sciatica, unspecified side: Secondary | ICD-10-CM

## 2013-06-23 DIAGNOSIS — Z79899 Other long term (current) drug therapy: Secondary | ICD-10-CM

## 2013-06-23 DIAGNOSIS — M5432 Sciatica, left side: Secondary | ICD-10-CM

## 2013-06-23 DIAGNOSIS — E1149 Type 2 diabetes mellitus with other diabetic neurological complication: Secondary | ICD-10-CM

## 2013-06-23 DIAGNOSIS — I10 Essential (primary) hypertension: Secondary | ICD-10-CM

## 2013-06-23 DIAGNOSIS — K120 Recurrent oral aphthae: Secondary | ICD-10-CM

## 2013-06-23 DIAGNOSIS — Z23 Encounter for immunization: Secondary | ICD-10-CM | POA: Diagnosis not present

## 2013-06-23 LAB — HEMOGLOBIN A1C: Hgb A1c MFr Bld: 6.9 % — ABNORMAL HIGH (ref 4.6–6.5)

## 2013-06-23 MED ORDER — DICLOFENAC SODIUM 75 MG PO TBEC: 75.0000 mg | DELAYED_RELEASE_TABLET | Freq: Two times a day (BID) | ORAL | Status: DC

## 2013-06-23 MED ORDER — TRIAMCINOLONE ACETONIDE 0.1 % MT PSTE: PASTE | Freq: Two times a day (BID) | OROMUCOSAL | Status: DC

## 2013-06-23 NOTE — Assessment & Plan Note (Signed)
On metformin; also ARB, ASA 81 -  Started low statin 01/2013  (previosuly declined but then reconsider following spouse MI 02/2012) reports home cbgs "ok" -130s Check a1c now, adjust if needed Lab Results  Component Value Date   HGBA1C 6.6* 02/09/2013

## 2013-06-23 NOTE — Progress Notes (Signed)
Subjective:    Patient ID: Heidi Franklin, female    DOB: 1946-09-26, 67 y.o.   MRN: [id number]  HPI Here for follow up - reviewed chronic medical issues today:  hypertension - reports variable compliance with ongoing medical treatment and no changes in medication dose or frequency. denies adverse side effects related to current therapy.   dyslipidemia - on low statin since spring 2014 - reports working on diet/exercise  DM2 - reports compliance with ongoing medical treatment and no changes in medication dose or frequency. denies adverse side effects related to current therapy.  home cbgs 130-140s, checks sugars few times each week - no signs or symptoms hypoglycemia  chronic bronchitis and allergies, working with pulm for same. Ongoing seasonal flare: cough remains dry - no mucus or fever or sore throat, chronic mild hoarseness from nasal drainage - no smoking hx -  GERD symptoms - on omeprazole qd with improvement, but intermittent use. Denies adverse side effects.  Anxiety, exac by spouse illness 02/2012 (AA surg, [place]) - takes BZ prn and started sertraline summer 2012 - the patient reports compliance with medication(s) as prescribed. Denies adverse side effects.    Past Medical History  Diagnosis Date  . BRONCHITIS, CHRONIC   . ALLERGIC RHINITIS   . Anxiety state, unspecified   . DEPRESSION   . DIABETES MELLITUS, TYPE II   . GERD   . HYPERLIPIDEMIA   . HYPERTENSION     Review of Systems  Constitutional: Positive for fatigue. Negative for fever and unexpected weight change.  Respiratory: Positive for cough. Negative for shortness of breath.   Cardiovascular: Negative for chest pain and palpitations.  Musculoskeletal: Positive for back pain (mild left sciatica pain <2 weeks).       Objective:   Physical Exam BP 122/70  Pulse 95  Temp(Src) 98.5 F (36.9 C) (Oral)  Wt 173 lb 9.6 oz (78.744 kg)  BMI 33.9 kg/m2  SpO2 98% Wt Readings from Last 3 Encounters:  06/23/13 173 lb  9.6 oz (78.744 kg)  06/16/13 175 lb (79.379 kg)  03/18/13 171 lb 3.2 oz (77.656 kg)   Constitutional: She is overweight, but appears well-developed and well-nourished. No distress.  Neck: Normal range of motion. Neck supple. No JVD present. No thyromegaly present.  Cardiovascular: Normal rate, regular rhythm and normal heart sounds.  No murmur heard. No BLE edema. Pulmonary/Chest: Effort normal and breath sounds normal. No respiratory distress. She has no wheezes.  Mskel: Back: full range of motion of thoracic and lumbar spine. Non tender to palpation. Negative straight leg raise. DTR's are symmetrically intact. Sensation intact in all dermatomes of the lower extremities. Full strength to manual muscle testing. patient is able to heel toe walk without difficulty and ambulates with mildly antalgic gait. Psychiatric: She has a mildly anxious mood and affect. Her behavior is normal. Judgment and thought content normal.    Lab Results  Component Value Date   WBC 10.9* 06/10/2012   HGB 12.2 06/10/2012   HCT 38.5 06/10/2012   PLT 272.0 06/10/2012   GLUCOSE 107* 06/10/2012   CHOL 216* 02/09/2013   TRIG 98.0 02/09/2013   HDL 38.10* 02/09/2013   LDLDIRECT 155.1 02/09/2013   LDLCALC 111* 06/10/2012   ALT 26 06/10/2012   AST 32 06/10/2012   NA 141 06/10/2012   K 4.1 06/10/2012   CL 103 06/10/2012   CREATININE 0.7 06/10/2012   BUN 14 06/10/2012   CO2 29 06/10/2012   TSH 1.81 06/10/2012   HGBA1C 6.6* 02/09/2013  MICROALBUR 1.7 02/09/2013        Assessment & Plan:  See problem list. Medications and labs reviewed today.  L sciatica - mild symptoms after housework in past 2 weeks - no neuro deficits on exam - treat with anti-inflammatory diclofenac 75 mg twice a day with food for 10 days, then as needed. Education reassurance provided. Home back exercises. Patient is to call if symptoms worse or unimproved with conservative care  Aphthous ulcers. Prescribed oral triamcinolone paste. Patient agrees followup  with dentist if symptoms unimproved or worse

## 2013-06-23 NOTE — Patient Instructions (Addendum)
It was good to see you today. We have reviewed your prior records including labs and tests today Medications reviewed and updated: Stop Bystolic, history diclofenac twice daily for next 10 days to help with back and leg pain, also triamcinolone paste for mouth ulcers as needed - no other changes recommended at this time. Your prescription(s) have been submitted to your pharmacy. Please take as directed and contact our office if you believe you are having problem(s) with the medication(s). Test(s) ordered today. Your results will be released to MyChart (or called to you) after review, usually within 72hours after test completion. If any changes need to be made, you will be notified at that same time. Please schedule followup in 4 months for diabetes mellitus check, call sooner if problems.   Back Exercises Back exercises help treat and prevent back injuries. The goal of back exercises is to increase the strength of your abdominal and back muscles and the flexibility of your back. These exercises should be started when you no longer have back pain. Back exercises include:  Pelvic Tilt. Lie on your back with your knees bent. Tilt your pelvis until the lower part of your back is against the floor. Hold this position 5 to 10 sec and repeat 5 to 10 times.  Knee to Chest. Pull first 1 knee up against your chest and hold for 20 to 30 seconds, repeat this with the other knee, and then both knees. This may be done with the other leg straight or bent, whichever feels better.  Sit-Ups or Curl-Ups. Bend your knees 90 degrees. Start with tilting your pelvis, and do a partial, slow sit-up, lifting your trunk only 30 to 45 degrees off the floor. Take at least 2 to 3 seconds for each sit-up. Do not do sit-ups with your knees out straight. If partial sit-ups are difficult, simply do the above but with only tightening your abdominal muscles and holding it as directed.  Hip-Lift. Lie on your back with your knees flexed  90 degrees. Push down with your feet and shoulders as you raise your hips a couple inches off the floor; hold for 10 seconds, repeat 5 to 10 times.  Back arches. Lie on your stomach, propping yourself up on bent elbows. Slowly press on your hands, causing an arch in your low back. Repeat 3 to 5 times. Any initial stiffness and discomfort should lessen with repetition over time.  Shoulder-Lifts. Lie face down with arms beside your body. Keep hips and torso pressed to floor as you slowly lift your head and shoulders off the floor. Do not overdo your exercises, especially in the beginning. Exercises may cause you some mild back discomfort which lasts for a few minutes; however, if the pain is more severe, or lasts for more than 15 minutes, do not continue exercises until you see your caregiver. Improvement with exercise therapy for back problems is slow.  See your caregivers for assistance with developing a proper back exercise program. Document Released: 11/21/2004 Document Revised: 01/06/2012 Document Reviewed: 08/15/2011 [facility] Patient Information 2014 [place], [place].

## 2013-06-23 NOTE — Assessment & Plan Note (Signed)
increased amlodipine dose 02/2011 -  Added beta-blocker 09/2012 - no need for dual beta blocker, discontinued Bystolic and continue the Toprol cont max ARB/hctz Encouraged to work on lifestyle control and weight loss BP Readings from Last 3 Encounters:  06/23/13 122/70  06/16/13 110/72  03/18/13 120/82

## 2013-06-23 NOTE — Assessment & Plan Note (Signed)
Prev rx'd statins including simva and prava but pt has been afraid of potential side effects, noncompliance with prior rx due to same  - Pt reconsider given spouse cardiac problems 02/2012  start low dose mevacor 01/2013 - recheck lipids now reviewed importance of good lipid control reviewed again with pt, esp given co-dz with diabetes mellitus + hypertension

## 2013-06-24 LAB — HEPATIC FUNCTION PANEL
ALT: 27 U/L (ref 0–35)
AST: 31 U/L (ref 0–37)
Albumin: 3.9 g/dL (ref 3.5–5.2)
Alkaline Phosphatase: 50 U/L (ref 39–117)
Bilirubin, Direct: 0 mg/dL (ref 0.0–0.3)
Total Bilirubin: 0.4 mg/dL (ref 0.3–1.2)
Total Protein: 7.6 g/dL (ref 6.0–8.3)

## 2013-06-24 LAB — LIPID PANEL
Cholesterol: 142 mg/dL (ref 0–200)
HDL: 35.6 mg/dL — ABNORMAL LOW (ref >39.00)
LDL Cholesterol: 77 mg/dL (ref 0–99)
Total CHOL/HDL Ratio: 4
Triglycerides: 147 mg/dL (ref 0.0–149.0)
VLDL: 29.4 mg/dL (ref 0.0–40.0)

## 2013-07-13 ENCOUNTER — Telehealth: Payer: Self-pay | Admitting: Internal Medicine

## 2013-07-13 MED ORDER — BUDESONIDE-FORMOTEROL FUMARATE 80-4.5 MCG/ACT IN AERO: 2.0000 | INHALATION_SPRAY | Freq: Two times a day (BID) | RESPIRATORY_TRACT | Status: DC

## 2013-07-13 NOTE — Telephone Encounter (Signed)
Rx has been sent in. Pt is aware. 

## 2013-07-23 ENCOUNTER — Telehealth: Payer: Self-pay | Admitting: Internal Medicine

## 2013-07-23 NOTE — Telephone Encounter (Signed)
I spoke with pt. She c/o hoarseness, dry cough, thick PND, one side of nose feels stopped up at times. She is taking chlor-trimeton at bedtime, flonase, delsym and hycodan cough syrup PRN. Pt is requesting further recs. Please advise Dr. Sherene Sires thanks  Allergies  Allergen Reactions  . Penicillins

## 2013-07-23 NOTE — Telephone Encounter (Signed)
Nothing else to suggest > needs ov to regroup but in meantime try afrn 12 hour one puff before flonase twice daily x 5 days only then ov with all meds in hand to see me or [name] NP

## 2013-07-23 NOTE — Telephone Encounter (Signed)
I spoke with pt and is aware of recs. appt scheduled. Nothing further needed

## 2013-07-27 ENCOUNTER — Telehealth: Payer: Self-pay | Admitting: Internal Medicine

## 2013-07-27 NOTE — Telephone Encounter (Signed)
Pt returned call. Spoke with patient who reported that she has been having occasional sharp pains in both her ears with sinus pressure and "a lot of" PND.  Pt asking if MW could look at her ears or if she should see her PCP regarding this.  Advised pt that her ear discomfort could be coming from the sinus congestion and drainage and that she may speak with MW regarding this at her 18.2.14 appt.  Pt okay with this recommendation and verbalized her understanding.  Nothing further needed at this time; will sign off.

## 2013-07-27 NOTE — Telephone Encounter (Signed)
lmomtcb x1 for pt 

## 2013-07-29 ENCOUNTER — Ambulatory Visit: Payer: Medicare Other | Admitting: Internal Medicine

## 2013-07-29 ENCOUNTER — Encounter: Payer: Self-pay | Admitting: Internal Medicine

## 2013-07-29 ENCOUNTER — Ambulatory Visit (INDEPENDENT_AMBULATORY_CARE_PROVIDER_SITE_OTHER): Payer: Medicare Other | Admitting: Internal Medicine

## 2013-07-29 VITALS — BP 120/60 | HR 93 | Temp 98.4°F | Ht 60.0 in | Wt 173.8 lb

## 2013-07-29 DIAGNOSIS — J45991 Cough variant asthma: Secondary | ICD-10-CM

## 2013-07-29 DIAGNOSIS — I10 Essential (primary) hypertension: Secondary | ICD-10-CM

## 2013-07-29 MED ORDER — GABAPENTIN 100 MG PO CAPS: 100.0000 mg | ORAL_CAPSULE | Freq: Three times a day (TID) | ORAL | Status: DC

## 2013-07-29 MED ORDER — OLMESARTAN-AMLODIPINE-HCTZ 40-10-25 MG PO TABS: ORAL_TABLET | ORAL | Status: DC

## 2013-07-29 NOTE — Progress Notes (Signed)
Subjective:     Patient ID: Heidi Franklin, female   DOB: 1946/10/17    MRN: [id number]  Brief patient profile:   5 yobf never smoker with chronic cough  since age 67's only resolves for a week at a time then recurs daily,  dx as  asthma, post-nasal drip with nasal septal deviation, and GERD.   HPI 04/17/2012 Acute ov/Nicolas Sisler   cc  Cough only about ever 50% improved at baseline then much worse x 5 days,  most notable after lie down but  also during the day,  Producing minimal clear mucus on mucinex - not able to use qvar effectively and having to use lots of saba which helps some but prednisone helps the most.   no assoc overt sinus or reflux symptoms >>added PPI/Pepcid and changed from qvar to symbicort   05/05/2012 Follow up and med review Returns for follow up and med review  Reviewed all her meds and organized them into a med calendar with pt education  Appears to be taking correctly.  Improved from last ov -started on Symbicort and GERD regimen.  Tolerating well.  Decreased dyspnea and wheezing.  No chest pain or edema.  rec Continue on current regimen .  Brush/rinse and gargle after inhaler use.  Follow med calendar closely and bring to each visit.  follow up Dr. Melvyn Novas  In 3-4 months and As needed     08/03/2012 f/u ov/Keena Dinse cc problems for decades, better on present rx than for many years prior to pulmonary clinic changes, not using calendar, poor hfa  technique   but no limiting sob.  rec Change zyrtec to where you take it only if needed for itching/ sneezing  Work on inhaler technique:      Ok to use symbicort with spacer if not improving   11/04/2012 f/u ov/Julia Kulzer did not bring calendar and says cough only a little better really unchanged for years x a after prednisone, still with poor hfa technique and using saba twice daily, no limiting sob. >steroid taper   11/23/2012 Follow up and Med calendar    Patient returns for a two-week followup and medication review. We reviewed all her  medications and organized them into a medication calendar with patient education. It appears the patient is taking her medications correctly. Last visit. Patient was given a steroid taper for flare of her cough. She was also changed from atenolol to bystolic  She says that her cough got better briefly but never totally went away, and over the last 3-4 days. Cough has flared back with intermittent congestion, hoarseness, and, harsh, barking cough at times. rec Try zyrtec > chlorpheniramine   03/04/2013 f/u ov/Monzerrath Mcburney re asthma with prominent cough Chief Complaint  Patient presents with  . Follow-up    Pt c/o increased cough x 2 wks- non prod and worse when she goes outside. Also, she is having increased SOB for the past wk   assoc with hoarsensess, day > night, using xopenex as rescue maybe twice daily? helps some  Has med calendar not really following action plan and unsure of names of meds including symbicort >>pred taper    03/18/2013 Follow up and med review  Patient returns for a two-week followup and medication review. We reviewed all her medications and organized them into a medication calendar with patient education. It appears the patient is taking her medications correctly. Last visit. Patient was given a steroid taper for flare of her cough. Cough is better.  Never goes entirely  away, has tickle in throat on /off.  rec No change rx use med calendar   06/16/2013 f/u ov/Elda Dunkerson  Chief Complaint  Patient presents with  . Follow-up    States that cough is slightly improved.   overall much better no recent attacks but still sensation drainage even on prednisone on prn chlortrimeton more day than night but no purulent secretions. No sob or need for albuterol prn rec change chlortrimeton 4 mg at hs and prn   07/29/2013 f/u ov/Francine Hannan re: chronic cough since age 67  Chief Complaint  Patient presents with  . Follow-up    Breathing is some worse since the last visit. She is needing  xopenex for rescue approx 2 x per wk. She c/o hoarseness and ear pain.      No obvious daytime variabilty or assoc  cp or chest tightness, subjective wheeze overt sinus or hb symptoms. No unusual exp hx or h/o childhood pna/ asthma or knowledge of premature birth.   Sleeping ok without nocturnal  or early am exacerbation  of respiratory  c/o's or need for noct saba. Also denies any obvious fluctuation of symptoms with weather or environmental changes or other aggravating or alleviating factors except as outlined above     Current Medications, Allergies, Past Medical History, Past Surgical History, Family History, and Social History were reviewed in [employer] record.  ROS  The following are not active complaints unless bolded sore throat, dysphagia, dental problems, itching, sneezing,  nasal congestion or excess/ purulent secretions, ear ache,   fever, chills, sweats, unintended wt loss, pleuritic or exertional cp, hemoptysis,  orthopnea pnd or leg swelling, presyncope, palpitations, heartburn, abdominal pain, anorexia, nausea, vomiting, diarrhea  or change in bowel or urinary habits, change in stools or urine, dysuria,hematuria,  rash, arthralgias, visual complaints, headache, numbness weakness or ataxia or problems with walking or coordination,  change in mood/affect or memory.                    Objective:   Physical Exam  Exam:  GEN: A/Ox3; pleasant , NAD, well nad mod hoarse amb bf nad  . Wt Readings from Last 3 Encounters:  07/29/13 173 lb 12.8 oz (78.835 kg)  06/23/13 173 lb 9.6 oz (78.744 kg)  06/16/13 175 lb (79.379 kg)          HEENT:  [place]/AT,  EACs-clear, TMs-wnl, NOSE-clear drainage  THROAT-clear, no lesions, no postnasal drip or exudate noted.   NECK:  Supple w/ fair ROM; no JVD; normal carotid impulses w/o bruits; no thyromegaly or nodules palpated; no lymphadenopathy.  RESP  Clear  P & A; w/o, wheezes/ rales/ or rhonchi.no accessory  muscle use, no dullness to percussion  CARD:  RRR, no m/r/g  , no peripheral edema, pulses intact, no cyanosis or clubbing.  GI:   Soft & nt; nml bowel sounds; no organomegaly or masses detected.  Musco: Warm bil, no deformities or joint swelling noted.   Neuro: alert, no focal deficits noted.    Skin: Warm, no lesions or rashes     Assessment:

## 2013-07-29 NOTE — Patient Instructions (Addendum)
Stop losartan/hctz and amlodipine Start Tribenzor one daily Start Neurontin 100 mg three times a day Take hydromet for cough or severe pain   Please see patient coordinator before you leave today  to schedule ENT eval  See [name] NP w/in 4 weeks with all your medications, even over the counter meds, separated in two separate bags, the ones you take no matter what vs the ones you stop once you feel better and take only as needed when you feel you need them.   [name]  will generate for you a new user friendly medication calendar that will put [place] all on the same page re: your medication use.     Without this process, it simply isn't possible to assure that we are providing  your outpatient care  with  the attention to detail we feel you deserve.   If we cannot assure that you're getting that kind of care,  then we cannot manage your problem effectively from this clinic.  Once you have seen [name] and we are sure that we're all on the same page with your medication use she will arrange follow up with me.

## 2013-07-30 DIAGNOSIS — R490 Dysphonia: Secondary | ICD-10-CM | POA: Diagnosis not present

## 2013-07-30 DIAGNOSIS — H9209 Otalgia, unspecified ear: Secondary | ICD-10-CM | POA: Diagnosis not present

## 2013-08-01 NOTE — Assessment & Plan Note (Signed)
Losartan in generic form has anecdotally been connected to cough so will stop amlodipine and Hyzaar and start tribenzor 40/10/12.5

## 2013-08-01 NOTE — Assessment & Plan Note (Addendum)
PFT 11/07/11>>FEV1 1.79 (102%), FEV1% 82, TLC 4.18 (104%), DLCO 69%, positive BD only in terms of fef 25/75  - hfa75% 03/04/2013  -med calendar 11/23/2012 , 03/18/2013    Doubt asthma  is the main problem but strongly favor  Classic Upper airway cough syndrome, so named because it's frequently impossible to sort out how much is  CR/sinusitis with freq throat clearing (which can be related to primary GERD)   vs  causing  secondary (" extra esophageal")  GERD from wide swings in gastric pressure that occur with throat clearing, often  promoting self use of mint and menthol lozenges that reduce the lower esophageal sphincter tone and exacerbate the problem further in a cyclical fashion.   These are the same pts (now being labeled as having "irritable larynx syndrome" by some cough centers) who not infrequently have a history of having failed to tolerate ace inhibitors,  dry powder inhalers or biphosphonates or report having atypical reflux symptoms that don't respond to standard doses of PPI , and are easily confused as having aecopd or asthma flares by even experienced allergists/ pulmonologists.   For now will try off cozar to see if cough better and ask ENT to see re Ear pain and throat irritation and in meantime add trial of neurontin for probable neurogenic/ cyclical cough that tends to destabilize her upper airway and leave on lowest dose of symbicort for the lower airways then consider stepdown to qvar

## 2013-08-03 ENCOUNTER — Encounter: Payer: Self-pay | Admitting: Internal Medicine

## 2013-08-03 NOTE — Telephone Encounter (Signed)
Please advise Dr. Wert thanks 

## 2013-08-11 ENCOUNTER — Other Ambulatory Visit: Payer: Self-pay | Admitting: *Deleted

## 2013-08-11 MED ORDER — FLUTICASONE PROPIONATE 50 MCG/ACT NA SUSP: 1.0000 | Freq: Two times a day (BID) | NASAL | Status: DC

## 2013-08-12 ENCOUNTER — Other Ambulatory Visit: Payer: Self-pay | Admitting: *Deleted

## 2013-08-12 DIAGNOSIS — H6980 Other specified disorders of Eustachian tube, unspecified ear: Secondary | ICD-10-CM | POA: Diagnosis not present

## 2013-08-12 DIAGNOSIS — J309 Allergic rhinitis, unspecified: Secondary | ICD-10-CM | POA: Diagnosis not present

## 2013-08-12 MED ORDER — FLUTICASONE PROPIONATE 50 MCG/ACT NA SUSP: 1.0000 | Freq: Two times a day (BID) | NASAL | Status: DC

## 2013-08-26 ENCOUNTER — Encounter: Payer: Self-pay | Admitting: Adult Health

## 2013-08-26 ENCOUNTER — Ambulatory Visit (INDEPENDENT_AMBULATORY_CARE_PROVIDER_SITE_OTHER): Payer: Medicare Other | Admitting: Adult Health

## 2013-08-26 VITALS — BP 124/56 | HR 91 | Temp 99.2°F | Ht 60.0 in | Wt 174.0 lb

## 2013-08-26 DIAGNOSIS — J45991 Cough variant asthma: Secondary | ICD-10-CM | POA: Diagnosis not present

## 2013-08-26 NOTE — Progress Notes (Signed)
Subjective:     Patient ID: Heidi Franklin, female   DOB: 1946/10/17    MRN: [id number]  Brief patient profile:   5 yobf never smoker with chronic cough  since age 67's only resolves for a week at a time then recurs daily,  dx as  asthma, post-nasal drip with nasal septal deviation, and GERD.   HPI 04/17/2012 Acute ov/Wert   cc  Cough only about ever 50% improved at baseline then much worse x 5 days,  most notable after lie down but  also during the day,  Producing minimal clear mucus on mucinex - not able to use qvar effectively and having to use lots of saba which helps some but prednisone helps the most.   no assoc overt sinus or reflux symptoms >>added PPI/Pepcid and changed from qvar to symbicort   05/05/2012 Follow up and med review Returns for follow up and med review  Reviewed all her meds and organized them into a med calendar with pt education  Appears to be taking correctly.  Improved from last ov -started on Symbicort and GERD regimen.  Tolerating well.  Decreased dyspnea and wheezing.  No chest pain or edema.  rec Continue on current regimen .  Brush/rinse and gargle after inhaler use.  Follow med calendar closely and bring to each visit.  follow up Dr. Melvyn Novas  In 3-4 months and As needed     08/03/2012 f/u ov/Wert cc problems for decades, better on present rx than for many years prior to pulmonary clinic changes, not using calendar, poor hfa  technique   but no limiting sob.  rec Change zyrtec to where you take it only if needed for itching/ sneezing  Work on inhaler technique:      Ok to use symbicort with spacer if not improving   11/04/2012 f/u ov/Wert did not bring calendar and says cough only a little better really unchanged for years x a after prednisone, still with poor hfa technique and using saba twice daily, no limiting sob. >steroid taper   11/23/2012 Follow up and Med calendar    Patient returns for a two-week followup and medication review. We reviewed all her  medications and organized them into a medication calendar with patient education. It appears the patient is taking her medications correctly. Last visit. Patient was given a steroid taper for flare of her cough. She was also changed from atenolol to bystolic  She says that her cough got better briefly but never totally went away, and over the last 3-4 days. Cough has flared back with intermittent congestion, hoarseness, and, harsh, barking cough at times. rec Try zyrtec > chlorpheniramine   03/04/2013 f/u ov/Wert re asthma with prominent cough Chief Complaint  Patient presents with  . Follow-up    Pt c/o increased cough x 2 wks- non prod and worse when she goes outside. Also, she is having increased SOB for the past wk   assoc with hoarsensess, day > night, using xopenex as rescue maybe twice daily? helps some  Has med calendar not really following action plan and unsure of names of meds including symbicort >>pred taper    03/18/2013 Follow up and med review  Patient returns for a two-week followup and medication review. We reviewed all her medications and organized them into a medication calendar with patient education. It appears the patient is taking her medications correctly. Last visit. Patient was given a steroid taper for flare of her cough. Cough is better.  Never goes entirely  away, has tickle in throat on /off.  rec No change rx use med calendar   06/16/2013 f/u ov/Wert  Chief Complaint  Patient presents with  . Follow-up    States that cough is slightly improved.   overall much better no recent attacks but still sensation drainage even on prednisone on prn chlortrimeton more day than night but no purulent secretions. No sob or need for albuterol prn rec change chlortrimeton 4 mg at hs and prn   07/29/2013 f/u ov/Wert re: chronic cough since age 89  Chief Complaint  Patient presents with  . Follow-up    Breathing is some worse since the last visit. She is needing  xopenex for rescue approx 2 x per wk. She c/o hoarseness and ear pain.    >>Neurontin Three times a day  rx , change b/p meds to tribenzor  08/26/2013 Follow up and Med review  Returns for follow up and med review.  We reviewed all her meds and organized them into a med calendar with pt education. Pt did not fill neurontin rx .  Can not afford Tribenzor and wants to go back to norvasc and losartan. Cough is some better . Comes and goes. Seen by ENT says laryngoscopy was benign. No records noted. Does still have PND, HA, bilateral ear congestion/popping. Using chlortrimeton At bedtime  Advised can use more often if needed.  No chest pain , orthopnea, edema , fever or discolored mucus.    Current Medications, Allergies, Past Medical History, Past Surgical History, Family History, and Social History were reviewed in [employer] record.  ROS  The following are not active complaints unless bolded sore throat, dysphagia, dental problems, itching, sneezing,  nasal congestion or excess/ purulent secretions, ear ache,   fever, chills, sweats, unintended wt loss, pleuritic or exertional cp, hemoptysis,  orthopnea pnd or leg swelling, presyncope, palpitations, heartburn, abdominal pain, anorexia, nausea, vomiting, diarrhea  or change in bowel or urinary habits, change in stools or urine, dysuria,hematuria,  rash, arthralgias, visual complaints, headache, numbness weakness or ataxia or problems with walking or coordination,  change in mood/affect or memory.        Objective:   Physical Exam  Exam:  GEN: A/Ox3; pleasant , NAD, well nad mod hoarse amb bf nad  HEENT:  [place]/AT,  EACs-clear, TMs-wnl, NOSE-clear drainage  THROAT-clear, no lesions, no postnasal drip or exudate noted.   NECK:  Supple w/ fair ROM; no JVD; normal carotid impulses w/o bruits; no thyromegaly or nodules palpated; no lymphadenopathy.  RESP  Diminished BS in bases, no accessory muscle use, no dullness to  percussion  CARD:  RRR, no m/r/g  , no peripheral edema, pulses intact, no cyanosis or clubbing.  GI:   Soft & nt; nml bowel sounds; no organomegaly or masses detected.  Musco: Warm bil, no deformities or joint swelling noted.   Neuro: alert, no focal deficits noted.    Skin: Warm, no lesions or rashes     Assessment:

## 2013-08-26 NOTE — Assessment & Plan Note (Addendum)
Improved control on current regimen  Cont w/ GERD and AR prevention  Never started neurontin - will hold for now as cough is improved Patient's medications were reviewed today and patient education was given. Computerized medication calendar was adjusted/completed   Plan  Brush/rinse and gargle after inhaler use.  Follow med calendar closely and bring to each visit.  follow up Dr. Sherene Sires  In 2-3 months and As needed

## 2013-08-26 NOTE — Patient Instructions (Signed)
Brush/rinse and gargle after inhaler use.  Follow med calendar closely and bring to each visit.  follow up Dr. Sherene Sires  In 2-3 months and As needed

## 2013-09-02 NOTE — Addendum Note (Signed)
Addended by: Boone Master E on: 09/02/2013 04:42 PM   Modules accepted: Orders, Medications

## 2013-09-10 ENCOUNTER — Encounter: Payer: Self-pay | Admitting: Internal Medicine

## 2013-09-10 ENCOUNTER — Ambulatory Visit (INDEPENDENT_AMBULATORY_CARE_PROVIDER_SITE_OTHER): Payer: Medicare Other | Admitting: Internal Medicine

## 2013-09-10 VITALS — BP 132/70 | HR 100 | Temp 99.5°F | Wt 170.1 lb

## 2013-09-10 DIAGNOSIS — D179 Benign lipomatous neoplasm, unspecified: Secondary | ICD-10-CM

## 2013-09-10 DIAGNOSIS — E785 Hyperlipidemia, unspecified: Secondary | ICD-10-CM | POA: Diagnosis not present

## 2013-09-10 DIAGNOSIS — I10 Essential (primary) hypertension: Secondary | ICD-10-CM | POA: Diagnosis not present

## 2013-09-10 DIAGNOSIS — E1149 Type 2 diabetes mellitus with other diabetic neurological complication: Secondary | ICD-10-CM | POA: Diagnosis not present

## 2013-09-10 NOTE — Assessment & Plan Note (Signed)
increased amlodipine dose 02/2011 -  Added beta-blocker 09/2012 - no need for dual beta blocker, discontinued Bystolic and continue the Toprol cont max ARB/hctz Encouraged to work on lifestyle control and weight loss BP Readings from Last 3 Encounters:  09/10/13 132/70  08/26/13 124/56  07/29/13 120/60

## 2013-09-10 NOTE — Assessment & Plan Note (Signed)
On metformin; also ARB, ASA 81 -  Started low statin 01/2013  (previosuly declined but then reconsider following spouse MI 02/2012) reports home cbgs "ok" -130s Check a1c now, adjust if needed Lab Results  Component Value Date   HGBA1C 6.9* 06/23/2013   

## 2013-09-10 NOTE — Progress Notes (Signed)
Pre-visit discussion using our clinic review tool. No additional management support is needed unless otherwise documented below in the visit note.  

## 2013-09-10 NOTE — Patient Instructions (Signed)

## 2013-09-10 NOTE — Progress Notes (Signed)
Subjective:    Patient ID: Heidi Franklin, female    DOB: Aug 22, 1946, 67 y.o.   MRN: [id number]  HPI See CC "knots" on BUE and thighs - present >12 mo but feels tender  Also reviewed chronic medical issues today:  hypertension - reports variable compliance with ongoing medical treatment and no changes in medication dose or frequency. denies adverse side effects related to current therapy.   dyslipidemia - on low statin since spring 2014 - reports working on diet/exercise  DM2 - reports compliance with ongoing medical treatment and no changes in medication dose or frequency. denies adverse side effects related to current therapy.  home cbgs 130-140s, checks sugars few times each week - no signs or symptoms hypoglycemia  chronic bronchitis and allergies, working with pulm for same. Ongoing seasonal flare: cough remains dry - no mucus or fever or sore throat, chronic mild hoarseness from nasal drainage - no smoking hx -  GERD symptoms - on omeprazole qd with improvement, but intermittent use. Denies adverse side effects.  Anxiety, exac by spouse illness 02/2012 (AA surg, MI) - takes BZ prn and started sertraline summer 2012 - the patient reports compliance with medication(s) as prescribed. Denies adverse side effects.    Past Medical History  Diagnosis Date  . BRONCHITIS, CHRONIC   . ALLERGIC RHINITIS   . Anxiety state, unspecified   . DEPRESSION   . DIABETES MELLITUS, TYPE II   . GERD   . HYPERLIPIDEMIA   . HYPERTENSION     Review of Systems  Constitutional: Positive for fatigue. Negative for fever and unexpected weight change.  Respiratory: Negative for shortness of breath.   Cardiovascular: Negative for chest pain and palpitations.  Skin: Negative for color change, pallor, rash and wound.       See CC       Objective:   Physical Exam BP 142/72  Pulse 100  Temp(Src) 99.5 F (37.5 C) (Oral)  Wt 170 lb 1.9 oz (77.166 kg)  SpO2 97% Wt Readings from Last 3 Encounters:   09/10/13 170 lb 1.9 oz (77.166 kg)  08/26/13 174 lb (78.926 kg)  07/29/13 173 lb 12.8 oz (78.835 kg)   Constitutional: She is overweight, but appears well-developed and well-nourished. No distress.  Neck: Normal range of motion. Neck supple. No JVD present. No thyromegaly present.  Cardiovascular: Normal rate, regular rhythm and normal heart sounds.  No murmur heard. No BLE edema. Pulmonary/Chest: Effort normal and breath sounds normal. No respiratory distress. She has no wheezes.  Skin:scattered firm rubbery nodules subcm consistent with lipomas on BE and B thighs - no erythema or color change Psychiatric: She has a mildly anxious mood and affect. Her behavior is normal. Judgment and thought content normal.    Lab Results  Component Value Date   WBC 10.9* 06/10/2012   HGB 12.2 06/10/2012   HCT 38.5 06/10/2012   PLT 272.0 06/10/2012   GLUCOSE 107* 06/10/2012   CHOL 142 06/23/2013   TRIG 147.0 06/23/2013   HDL 35.60* 06/23/2013   LDLDIRECT 155.1 02/09/2013   LDLCALC 77 06/23/2013   ALT 27 06/23/2013   AST 31 06/23/2013   NA 141 06/10/2012   K 4.1 06/10/2012   CL 103 06/10/2012   CREATININE 0.7 06/10/2012   BUN 14 06/10/2012   CO2 29 06/10/2012   TSH 1.81 06/10/2012   HGBA1C 6.9* 06/23/2013   MICROALBUR 1.7 02/09/2013        Assessment & Plan:  See problem list. Medications and labs reviewed today.  Lipomas - education and reassurance provided  Time spent with pt today 25 minutes, greater than 50% time spent counseling patient on lipomas, diabetes, hypertension and medication review. Also review of prior records

## 2013-09-10 NOTE — Assessment & Plan Note (Signed)
Prev rx'd statins including simva and prava but pt has been afraid of potential side effects, noncompliance with prior rx due to same  - following spouse cardiac problems 02/2012, pt agreed to take as rx'd start low dose mevacor 01/2013 - recheck lipids annually reviewed importance of good lipid control reviewed again with pt, esp given co-dz with diabetes mellitus + hypertension  

## 2013-09-20 ENCOUNTER — Encounter: Payer: Self-pay | Admitting: Internal Medicine

## 2013-09-21 ENCOUNTER — Other Ambulatory Visit: Payer: Self-pay | Admitting: Internal Medicine

## 2013-09-24 ENCOUNTER — Ambulatory Visit (INDEPENDENT_AMBULATORY_CARE_PROVIDER_SITE_OTHER): Payer: Medicare Other | Admitting: Internal Medicine

## 2013-09-24 ENCOUNTER — Encounter: Payer: Self-pay | Admitting: Internal Medicine

## 2013-09-24 VITALS — BP 124/80 | HR 96 | Temp 98.2°F | Ht 60.0 in | Wt 173.2 lb

## 2013-09-24 DIAGNOSIS — H00019 Hordeolum externum unspecified eye, unspecified eyelid: Secondary | ICD-10-CM

## 2013-09-24 DIAGNOSIS — H00013 Hordeolum externum right eye, unspecified eyelid: Secondary | ICD-10-CM

## 2013-09-24 MED ORDER — ERYTHROMYCIN 5 MG/GM OP OINT: 1.0000 "application " | TOPICAL_OINTMENT | Freq: Four times a day (QID) | OPHTHALMIC | Status: DC

## 2013-09-24 NOTE — Progress Notes (Signed)
   Subjective:    Patient ID: Heidi Franklin, female    DOB: 1946-01-28, 67 y.o.   MRN: [id number]  Eye Problem  The right eye is affected.This is a new problem. The current episode started in the past 7 days. The problem occurs constantly. The problem has been unchanged. There was no injury mechanism. The pain is mild. There is no known exposure to pink eye. She does not wear contacts. Associated symptoms include itching. Pertinent negatives include no blurred vision, eye discharge, double vision, eye redness, fever, foreign body sensation, nausea, photophobia, recent URI or vomiting. Treatments tried: warm compress. The treatment provided no relief.   Past Medical History  Diagnosis Date  . BRONCHITIS, CHRONIC   . ALLERGIC RHINITIS   . Anxiety state, unspecified   . DEPRESSION   . DIABETES MELLITUS, TYPE II   . GERD   . HYPERLIPIDEMIA   . HYPERTENSION     Review of Systems  Constitutional: Negative for fever.  Eyes: Negative for blurred vision, double vision, photophobia, discharge and redness.  Gastrointestinal: Negative for nausea and vomiting.  Skin: Positive for itching.       Objective:   Physical Exam BP 124/80  Pulse 96  Temp(Src) 98.2 F (36.8 C) (Oral)  Ht 5' (1.524 m)  Wt 173 lb 4 oz (78.586 kg)  BMI 33.84 kg/m2  SpO2 98% Wt Readings from Last 3 Encounters:  09/24/13 173 lb 4 oz (78.586 kg)  09/10/13 170 lb 1.9 oz (77.166 kg)  08/26/13 174 lb (78.926 kg)   Constitutional: She appears well-developed and well-nourished. No distress.  HENT: Head: Normocephalic and atraumatic. Ears: B TMs ok, no erythema or effusion; Nose: Nose normal. Mouth/Throat: Oropharynx is clear and moist. No oropharyngeal exudate.  Eyes: R lower lid with small stye, center of eyelid. No periorbital swelling or cellulitis. Conjunctivae and EOM are normal. Pupils are equal, round, and reactive to light. No scleral icterus.  Neck: Normal range of motion. Neck supple. No JVD present. No  thyromegaly present.  Cardiovascular: Normal rate, regular rhythm and normal heart sounds.  No murmur heard. No BLE edema. Pulmonary/Chest: Effort normal and breath sounds normal. No respiratory distress. She has no wheezes.   Skin: Skin is warm and dry. No rash noted. No erythema.  Psychiatric: She has a normal mood and affect. Her behavior is normal. Judgment and thought content normal.   Lab Results  Component Value Date   WBC 10.9* 06/10/2012   HGB 12.2 06/10/2012   HCT 38.5 06/10/2012   PLT 272.0 06/10/2012   GLUCOSE 107* 06/10/2012   CHOL 142 06/23/2013   TRIG 147.0 06/23/2013   HDL 35.60* 06/23/2013   LDLDIRECT 155.1 02/09/2013   LDLCALC 77 06/23/2013   ALT 27 06/23/2013   AST 31 06/23/2013   NA 141 06/10/2012   K 4.1 06/10/2012   CL 103 06/10/2012   CREATININE 0.7 06/10/2012   BUN 14 06/10/2012   CO2 29 06/10/2012   TSH 1.81 06/10/2012   HGBA1C 6.9* 06/23/2013   MICROALBUR 1.7 02/09/2013       Assessment & Plan:   Stye - no evidence for cellulitis Failing conservative care with warm compress, symptoms ongoing greater than 72 hours   Add topical antibiotics

## 2013-09-24 NOTE — Progress Notes (Signed)
Pre visit review using our clinic review tool, if applicable. No additional management support is needed unless otherwise documented below in the visit note. 

## 2013-09-24 NOTE — Patient Instructions (Addendum)
It was good to see you today.  antibiotics ointment for your eye - use 4x/day until improved - Your prescription(s) have been submitted to your pharmacy. Please take as directed and contact our office if you believe you are having problem(s) with the medication(s).  Continue warm compress 4x/day  Call your eye doctor if unimproved in next 7 days, sooner if worse  Sty A sty (hordeolum) is an infection of a gland in the eyelid located at the base of the eyelash. A sty may develop a white or yellow head of pus. It can be puffy (swollen). Usually, the sty will burst and pus will come out on its own. They do not leave lumps in the eyelid once they drain. A sty is often confused with another form of cyst of the eyelid called a chalazion. Chalazions occur within the eyelid and not on the edge where the bases of the eyelashes are. They often are red, sore and then form firm lumps in the eyelid. CAUSES   Germs (bacteria).  Lasting (chronic) eyelid inflammation. SYMPTOMS   Tenderness, redness and swelling along the edge of the eyelid at the base of the eyelashes.  Sometimes, there is a white or yellow head of pus. It may or may not drain. DIAGNOSIS  An ophthalmologist will be able to distinguish between a sty and a chalazion and treat the condition appropriately.  TREATMENT   Styes are typically treated with warm packs (compresses) until drainage occurs.  In rare cases, medicines that kill germs (antibiotics) may be prescribed. These antibiotics may be in the form of drops, cream or pills.  If a hard lump has formed, it is generally necessary to do a small incision and remove the hardened contents of the cyst in a minor surgical procedure done in the office.  In suspicious cases, your caregiver may send the contents of the cyst to the lab to be certain that it is not a rare, but dangerous form of cancer of the glands of the eyelid. HOME CARE INSTRUCTIONS   Wash your hands often and dry them  with a clean towel. Avoid touching your eyelid. This may spread the infection to other parts of the eye.  Apply heat to your eyelid for 10 to 20 minutes, several times a day, to ease pain and help to heal it faster.  Do not squeeze the sty. Allow it to drain on its own. Wash your eyelid carefully 3 to 4 times per day to remove any pus. SEEK IMMEDIATE MEDICAL CARE IF:   Your eye becomes painful or puffy (swollen).  Your vision changes.  Your sty does not drain by itself within 3 days.  Your sty comes back within a short period of time, even with treatment.  You have redness (inflammation) around the eye.  You have a fever. Document Released: 07/24/2005 Document Revised: 01/06/2012 Document Reviewed: 03/28/2009 [facility] Patient Information 2014 [place], [place].

## 2013-10-04 ENCOUNTER — Telehealth: Payer: Self-pay

## 2013-10-04 DIAGNOSIS — H5789 Other specified disorders of eye and adnexa: Secondary | ICD-10-CM

## 2013-10-04 NOTE — Telephone Encounter (Signed)
Phone call from patient [phone] she states she has a stye on right eye. No improvement. It's been there for about 2 weeks.

## 2013-10-04 NOTE — Telephone Encounter (Signed)
Noted. Will refer to eye specialist for evaluation and treatment of same.

## 2013-10-04 NOTE — Telephone Encounter (Signed)
Patient advised via voicemail

## 2013-10-06 DIAGNOSIS — Z1231 Encounter for screening mammogram for malignant neoplasm of breast: Secondary | ICD-10-CM | POA: Diagnosis not present

## 2013-10-07 ENCOUNTER — Telehealth: Payer: Self-pay | Admitting: *Deleted

## 2013-10-07 DIAGNOSIS — H00019 Hordeolum externum unspecified eye, unspecified eyelid: Secondary | ICD-10-CM | POA: Diagnosis not present

## 2013-10-07 LAB — HM DIABETES EYE EXAM

## 2013-10-07 NOTE — Telephone Encounter (Signed)
Pt called requesting status of Opthamology referral.

## 2013-10-08 ENCOUNTER — Other Ambulatory Visit: Payer: Self-pay | Admitting: *Deleted

## 2013-10-08 MED ORDER — FLUTICASONE PROPIONATE 50 MCG/ACT NA SUSP: 2.0000 | Freq: Two times a day (BID) | NASAL | Status: DC

## 2013-10-13 ENCOUNTER — Encounter: Payer: Self-pay | Admitting: Internal Medicine

## 2013-10-15 ENCOUNTER — Ambulatory Visit: Payer: Medicare Other | Admitting: Internal Medicine

## 2013-10-15 DIAGNOSIS — H00019 Hordeolum externum unspecified eye, unspecified eyelid: Secondary | ICD-10-CM | POA: Diagnosis not present

## 2013-10-26 ENCOUNTER — Ambulatory Visit (INDEPENDENT_AMBULATORY_CARE_PROVIDER_SITE_OTHER): Payer: Medicare Other | Admitting: Internal Medicine

## 2013-10-26 ENCOUNTER — Encounter: Payer: Self-pay | Admitting: Internal Medicine

## 2013-10-26 VITALS — BP 130/76 | HR 109 | Temp 98.0°F | Ht 60.0 in | Wt 169.0 lb

## 2013-10-26 DIAGNOSIS — J45991 Cough variant asthma: Secondary | ICD-10-CM

## 2013-10-26 DIAGNOSIS — I10 Essential (primary) hypertension: Secondary | ICD-10-CM | POA: Diagnosis not present

## 2013-10-26 MED ORDER — VALSARTAN-HYDROCHLOROTHIAZIDE 160-25 MG PO TABS: 1.0000 | ORAL_TABLET | Freq: Every day | ORAL | Status: DC

## 2013-10-26 NOTE — Progress Notes (Signed)
Subjective:     Patient ID: Heidi Franklin, female   DOB: 1946/10/17    MRN: [id number]  Brief patient profile:   5 yobf never smoker with chronic cough  since age 67's only resolves for a week at a time then recurs daily,  dx as  asthma, post-nasal drip with nasal septal deviation, and GERD.   HPI 04/17/2012 Acute ov/Heidi Franklin   cc  Cough only about ever 50% improved at baseline then much worse x 5 days,  most notable after lie down but  also during the day,  Producing minimal clear mucus on mucinex - not able to use qvar effectively and having to use lots of saba which helps some but prednisone helps the most.   no assoc overt sinus or reflux symptoms >>added PPI/Pepcid and changed from qvar to symbicort   05/05/2012 Follow up and med review Returns for follow up and med review  Reviewed all her meds and organized them into a med calendar with pt education  Appears to be taking correctly.  Improved from last ov -started on Symbicort and GERD regimen.  Tolerating well.  Decreased dyspnea and wheezing.  No chest pain or edema.  rec Continue on current regimen .  Brush/rinse and gargle after inhaler use.  Follow med calendar closely and bring to each visit.  follow up Dr. Melvyn Novas  In 3-4 months and As needed     08/03/2012 f/u ov/Heidi Franklin cc problems for decades, better on present rx than for many years prior to pulmonary clinic changes, not using calendar, poor hfa  technique   but no limiting sob.  rec Change zyrtec to where you take it only if needed for itching/ sneezing  Work on inhaler technique:      Ok to use symbicort with spacer if not improving   11/04/2012 f/u ov/Heidi Franklin did not bring calendar and says cough only a little better really unchanged for years x a after prednisone, still with poor hfa technique and using saba twice daily, no limiting sob. >steroid taper   11/23/2012 Follow up and Med calendar    Patient returns for a two-week followup and medication review. We reviewed all her  medications and organized them into a medication calendar with patient education. It appears the patient is taking her medications correctly. Last visit. Patient was given a steroid taper for flare of her cough. She was also changed from atenolol to bystolic  She says that her cough got better briefly but never totally went away, and over the last 3-4 days. Cough has flared back with intermittent congestion, hoarseness, and, harsh, barking cough at times. rec Try zyrtec > chlorpheniramine   03/04/2013 f/u ov/Heidi Franklin re asthma with prominent cough Chief Complaint  Patient presents with  . Follow-up    Pt c/o increased cough x 2 wks- non prod and worse when she goes outside. Also, she is having increased SOB for the past wk   assoc with hoarsensess, day > night, using xopenex as rescue maybe twice daily? helps some  Has med calendar not really following action plan and unsure of names of meds including symbicort >>pred taper    03/18/2013 Follow up and med review  Patient returns for a two-week followup and medication review. We reviewed all her medications and organized them into a medication calendar with patient education. It appears the patient is taking her medications correctly. Last visit. Patient was given a steroid taper for flare of her cough. Cough is better.  Never goes entirely  away, has tickle in throat on /off.  rec No change rx use med calendar   06/16/2013 f/u ov/Heidi Franklin  Chief Complaint  Patient presents with  . Follow-up    States that cough is slightly improved.   overall much better no recent attacks but still sensation drainage even on prednisone on prn chlortrimeton more day than night but no purulent secretions. No sob or need for albuterol prn rec change chlortrimeton 4 mg at hs and prn   07/29/2013 f/u ov/Heidi Franklin re: chronic cough since age 67  Chief Complaint  Patient presents with  . Follow-up    Breathing is some worse since the last visit. She is needing  xopenex for rescue approx 2 x per wk. She c/o hoarseness and ear pain.    >>Neurontin Three times a day  rx , change b/p meds to tribenzor  08/26/2013 Follow up and Med review  Returns for follow up and med review.  We reviewed all her meds and organized them into a med calendar with pt education. Pt did not fill neurontin rx .  Can not afford Tribenzor and wants to go back to norvasc and losartan. Cough is some better . Comes and goes. Seen by ENT says laryngoscopy was benign. No records noted. Does still have PND, HA, bilateral ear congestion/popping. Using chlortrimeton At bedtime  Advised can use more often if needed.  rec No change in rx  10/26/2013 f/u ov/Heidi Franklin re: chronic cough back on cozar Chief Complaint  Patient presents with  . Follow-up    Breathing and cough doing well.  Over weekend developed cough non-produtive  says never really completely free of cough, now worse, not compliant with symbicort.   No obvious day to day or daytime variabilty or assoc chronic cough or cp or chest tightness, subjective wheeze overt sinus or hb symptoms. No unusual exp hx or h/o childhood pna/ asthma or knowledge of premature birth.  Sleeping ok without nocturnal  or early am exacerbation  of respiratory  c/o's or need for noct saba. Also denies any obvious fluctuation of symptoms with weather or environmental changes or other aggravating or alleviating factors except as outlined above   Current Medications, Allergies, Complete Past Medical History, Past Surgical History, Family History, and Social History were reviewed in [employer] record.  ROS  The following are not active complaints unless bolded sore throat, dysphagia, dental problems, itching, sneezing,  nasal congestion or excess/ purulent secretions, ear ache,   fever, chills, sweats, unintended wt loss, pleuritic or exertional cp, hemoptysis,  orthopnea pnd or leg swelling, presyncope, palpitations, heartburn,  abdominal pain, anorexia, nausea, vomiting, diarrhea  or change in bowel or urinary habits, change in stools or urine, dysuria,hematuria,  rash, arthralgias, visual complaints, headache, numbness weakness or ataxia or problems with walking or coordination,  change in mood/affect or memory.              Objective:   Physical Exam  Exam:  GEN: A/Ox3; pleasant , NAD, well nad mod hoarse amb bf nad with mild pseudowheeze  HEENT:  [place]/AT,  EACs-clear, TMs-wnl, NOSE-clear drainage  THROAT-clear, no lesions, no postnasal drip or exudate noted.   NECK:  Supple w/ fair ROM; no JVD; normal carotid impulses w/o bruits; no thyromegaly or nodules palpated; no lymphadenopathy.  RESP  Diminished BS in bases, no accessory muscle use, no dullness to percussion  CARD:  RRR, no m/r/g  , no peripheral edema, pulses intact, no cyanosis or clubbing.  GI:  Soft & nt; nml bowel sounds; no organomegaly or masses detected.  Musco: Warm bil, no deformities or joint swelling noted.   Neuro: alert, no focal deficits noted.    Skin: Warm, no lesions or rashes     Assessment:

## 2013-10-26 NOTE — Patient Instructions (Addendum)
Stop losartan Start diovan 160 /25 one daily   Change symbicort to 2 puffs every 12 hours   See [name] NP w/in 4 weeks with all your medications and your formulary if there are problems getting your medications, even over the counter meds, separated in two separate bags, the ones you take no matter what vs the ones you stop once you feel better and take only as needed when you feel you need them.   [name]  will generate for you a new user friendly medication calendar that will put [place] all on the same page re: your medication use.     Without this process, it simply isn't possible to assure that we are providing  your outpatient care  with  the attention to detail we feel you deserve.   If we cannot assure that you're getting that kind of care,  then we cannot manage your problem effectively from this clinic.  Once you have seen [name] and we are sure that we're all on the same page with your medication use she will arrange follow up with me.  If not better ? Needs MCT off symbicort x 24 h as next step

## 2013-10-28 NOTE — Assessment & Plan Note (Signed)
Try off losartan 07/29/13  Repeat trial off losartan 10/26/13   For reasons not clear to me, losartan in the generic form has been reported now from mulitple sources, the most recent of which = Journal of Immunology, to cause the same pattern of upper airway symptoms as seen with acei.   This has not been reported in the other ARB's to date.   Will try generic diovan then regroup for purposes of maintaining 100% med reconciliation

## 2013-10-28 NOTE — Assessment & Plan Note (Addendum)
PFT 11/07/11>>FEV1 1.79 (102%),  Ratio 82, TLC 4.18 (104%), DLCO 69%, positive BD only in terms of fef 25/75  - hfa75% 03/04/2013  -med calendar 11/23/2012 , 03/18/2013 , 08/26/2013    DDX of  difficult airways managment all start with A and  include Adherence, Ace Inhibitors, Acid Reflux, Active Sinus Disease, Alpha 1 Antitripsin deficiency, Anxiety masquerading as Airways dz,  ABPA,  allergy(esp in young), Aspiration (esp in elderly), Adverse effects of DPI,  Active smokers, plus two Bs  = Bronchiectasis and Beta blocker use..and one C= CHF  Adherence is always the initial "prime suspect" and is a multilayered concern that requires a "trust but verify" approach in every patient - starting with knowing how to use medications, especially inhalers, correctly, keeping up with refills and understanding the fundamental difference between maintenance and prns vs those medications only taken for a very short course and then stopped and not refilled. Not using med calendar as requested,  Confused with multiple changes to meds related to insurance substitutues The proper method of use, as well as anticipated side effects, of a metered-dose inhaler are discussed and demonstrated to the patient. Improved effectiveness after extensive coaching during this visit to a level of approximately  75% so continue low dose symbicort 2 bid   ? ACEi/ losartan side effects > try generic  diovan (see HBP)  ? Acid (or non-acid) GERD > always difficult to exclude as up to 75% of pts in some series report no assoc GI/ Heartburn symptoms> rec max (24h)  acid suppression and diet restrictions/ reviewed and instructions given in writing.   ? Allergy/ asthma > resume symbicort consistently  And singulair but if dx remains in doubt needs Methacholine challenge off symbicort x 24 h

## 2013-11-03 ENCOUNTER — Telehealth: Payer: Self-pay | Admitting: Internal Medicine

## 2013-11-03 DIAGNOSIS — Z1211 Encounter for screening for malignant neoplasm of colon: Secondary | ICD-10-CM

## 2013-11-03 NOTE — Telephone Encounter (Signed)
Done

## 2013-11-03 NOTE — Telephone Encounter (Signed)
Pt request referral for Colonoscopy. Please advise.

## 2013-11-19 ENCOUNTER — Encounter: Payer: Self-pay | Admitting: Internal Medicine

## 2013-11-23 ENCOUNTER — Other Ambulatory Visit (INDEPENDENT_AMBULATORY_CARE_PROVIDER_SITE_OTHER): Payer: Medicare Other

## 2013-11-23 ENCOUNTER — Ambulatory Visit (INDEPENDENT_AMBULATORY_CARE_PROVIDER_SITE_OTHER): Payer: Medicare Other | Admitting: Internal Medicine

## 2013-11-23 ENCOUNTER — Encounter: Payer: Self-pay | Admitting: Internal Medicine

## 2013-11-23 VITALS — BP 130/68 | HR 94 | Temp 98.3°F | Wt 168.1 lb

## 2013-11-23 DIAGNOSIS — F411 Generalized anxiety disorder: Secondary | ICD-10-CM | POA: Diagnosis not present

## 2013-11-23 DIAGNOSIS — E1149 Type 2 diabetes mellitus with other diabetic neurological complication: Secondary | ICD-10-CM

## 2013-11-23 DIAGNOSIS — Z13820 Encounter for screening for osteoporosis: Secondary | ICD-10-CM

## 2013-11-23 DIAGNOSIS — Z0000 Encounter for general adult medical examination without abnormal findings: Secondary | ICD-10-CM | POA: Diagnosis not present

## 2013-11-23 DIAGNOSIS — I10 Essential (primary) hypertension: Secondary | ICD-10-CM | POA: Diagnosis not present

## 2013-11-23 DIAGNOSIS — Z23 Encounter for immunization: Secondary | ICD-10-CM | POA: Diagnosis not present

## 2013-11-23 LAB — HEMOGLOBIN A1C: Hgb A1c MFr Bld: 6.9 % — ABNORMAL HIGH (ref 4.6–6.5)

## 2013-11-23 LAB — BASIC METABOLIC PANEL
BUN: 14 mg/dL (ref 6–23)
CO2: 30 mEq/L (ref 19–32)
Calcium: 9.6 mg/dL (ref 8.4–10.5)
Chloride: 106 mEq/L (ref 96–112)
Creatinine, Ser: 0.9 mg/dL (ref 0.4–1.2)
GFR: 84.21 mL/min (ref >60.00)
Glucose, Bld: 105 mg/dL — ABNORMAL HIGH (ref 70–99)
Potassium: 3.7 mEq/L (ref 3.5–5.1)
Sodium: 142 mEq/L (ref 135–145)

## 2013-11-23 LAB — MICROALBUMIN / CREATININE URINE RATIO
Creatinine,U: 202.9 mg/dL
Microalb Creat Ratio: 0.6 mg/g (ref 0.0–30.0)
Microalb, Ur: 1.2 mg/dL (ref 0.0–1.9)

## 2013-11-23 NOTE — Patient Instructions (Addendum)
It was good to see you today.  We have reviewed your prior records including labs and tests today  Health Maintenance reviewed - Tetanus updated today, will schedule your bone density -all other recommended immunizations and age-appropriate screenings are up-to-date.  Test(s) ordered today. Your results will be released to [place] (or called to you) after review, usually within 72hours after test completion. If any changes need to be made, you will be notified at that same time.  Medications reviewed and updated, no changes recommended at this time.  Please schedule followup in 4 months for diabetes check and labs, call sooner if problems.   Health Maintenance, Female A healthy lifestyle and preventative care can promote health and wellness.  Maintain regular health, dental, and eye exams.  Eat a healthy diet. Foods like vegetables, fruits, whole grains, low-fat dairy products, and lean protein foods contain the nutrients you need without too many calories. Decrease your intake of foods high in solid fats, added sugars, and salt. Get information about a proper diet from your caregiver, if necessary.  Regular physical exercise is one of the most important things you can do for your health. Most adults should get at least 150 minutes of moderate-intensity exercise (any activity that increases your heart rate and causes you to sweat) each week. In addition, most adults need muscle-strengthening exercises on 2 or more days a week.   Maintain a healthy weight. The body mass index (BMI) is a screening tool to identify possible weight problems. It provides an estimate of body fat based on height and weight. Your caregiver can help determine your BMI, and can help you achieve or maintain a healthy weight. For adults 20 years and older:  A BMI below 18.5 is considered underweight.  A BMI of 18.5 to 24.9 is normal.  A BMI of 25 to 29.9 is considered overweight.  A BMI of 30 and above is  considered obese.  Maintain normal blood lipids and cholesterol by exercising and minimizing your intake of saturated fat. Eat a balanced diet with plenty of fruits and vegetables. Blood tests for lipids and cholesterol should begin at age 67 and be repeated every 5 years. If your lipid or cholesterol levels are high, you are over 50, or you are a high risk for heart disease, you may need your cholesterol levels checked more frequently.Ongoing high lipid and cholesterol levels should be treated with medicines if diet and exercise are not effective.  If you smoke, find out from your caregiver how to quit. If you do not use tobacco, do not start.  Lung cancer screening is recommended for adults aged 32 80 years who are at high risk for developing lung cancer because of a history of smoking. Yearly low-dose computed tomography (CT) is recommended for people who have at least a 30-pack-year history of smoking and are a current smoker or have quit within the past 15 years. A pack year of smoking is smoking an average of 1 pack of cigarettes a day for 1 year (for example: 1 pack a day for 30 years or 2 packs a day for 15 years). Yearly screening should continue until the smoker has stopped smoking for at least 15 years. Yearly screening should also be stopped for people who develop a health problem that would prevent them from having lung cancer treatment.  If you are pregnant, do not drink alcohol. If you are breastfeeding, be very cautious about drinking alcohol. If you are not pregnant and choose to drink  alcohol, do not exceed 1 drink per day. One drink is considered to be 12 ounces (355 mL) of beer, 5 ounces (148 mL) of wine, or 1.5 ounces (44 mL) of liquor.  Avoid use of street drugs. Do not share needles with anyone. Ask for help if you need support or instructions about stopping the use of drugs.  High blood pressure causes heart disease and increases the risk of stroke. Blood pressure should be  checked at least every 1 to 2 years. Ongoing high blood pressure should be treated with medicines, if weight loss and exercise are not effective.  If you are 48 to 69 years old, ask your caregiver if you should take aspirin to prevent strokes.  Diabetes screening involves taking a blood sample to check your fasting blood sugar level. This should be done once every 3 years, after age 48, if you are within normal weight and without risk factors for diabetes. Testing should be considered at a younger age or be carried out more frequently if you are overweight and have at least 1 risk factor for diabetes.  Breast cancer screening is essential preventative care for women. You should practice "breast self-awareness." This means understanding the normal appearance and feel of your breasts and may include breast self-examination. Any changes detected, no matter how small, should be reported to a caregiver. Women in their 71s and 30s should have a clinical breast exam (CBE) by a caregiver as part of a regular health exam every 1 to 3 years. After age 95, women should have a CBE every year. Starting at age 42, women should consider having a mammogram (breast X-ray) every year. Women who have a family history of breast cancer should talk to their caregiver about genetic screening. Women at a high risk of breast cancer should talk to their caregiver about having an MRI and a mammogram every year.  Breast cancer gene (BRCA)-related cancer risk assessment is recommended for women who have family members with BRCA-related cancers. BRCA-related cancers include breast, ovarian, tubal, and peritoneal cancers. Having family members with these cancers may be associated with an increased risk for harmful changes (mutations) in the breast cancer genes BRCA1 and BRCA2. Results of the assessment will determine the need for genetic counseling and BRCA1 and BRCA2 testing.  The Pap test is a screening test for cervical cancer. Women  should have a Pap test starting at age 45. Between ages 20 and 54, Pap tests should be repeated every 2 years. Beginning at age 50, you should have a Pap test every 3 years as long as the past 3 Pap tests have been normal. If you had a hysterectomy for a problem that was not cancer or a condition that could lead to cancer, then you no longer need Pap tests. If you are between ages 60 and 43, and you have had normal Pap tests going back 10 years, you no longer need Pap tests. If you have had past treatment for cervical cancer or a condition that could lead to cancer, you need Pap tests and screening for cancer for at least 20 years after your treatment. If Pap tests have been discontinued, risk factors (such as a new sexual partner) need to be reassessed to determine if screening should be resumed. Some women have medical problems that increase the chance of getting cervical cancer. In these cases, your caregiver may recommend more frequent screening and Pap tests.  The human papillomavirus (HPV) test is an additional test that may be  used for cervical cancer screening. The HPV test looks for the virus that can cause the cell changes on the cervix. The cells collected during the Pap test can be tested for HPV. The HPV test could be used to screen women aged 34 years and older, and should be used in women of any age who have unclear Pap test results. After the age of 17, women should have HPV testing at the same frequency as a Pap test.  Colorectal cancer can be detected and often prevented. Most routine colorectal cancer screening begins at the age of 50 and continues through age 47. However, your caregiver may recommend screening at an earlier age if you have risk factors for colon cancer. On a yearly basis, your caregiver may provide home test kits to check for hidden blood in the stool. Use of a small camera at the end of a tube, to directly examine the colon (sigmoidoscopy or colonoscopy), can detect the  earliest forms of colorectal cancer. Talk to your caregiver about this at age 74, when routine screening begins. Direct examination of the colon should be repeated every 5 to 10 years through age 19, unless early forms of pre-cancerous polyps or small growths are found.  Hepatitis C blood testing is recommended for all people born from [birth year] through 1966 and any individual with known risks for hepatitis C.  Practice safe sex. Use condoms and avoid high-risk sexual practices to reduce the spread of sexually transmitted infections (STIs). Sexually active women aged 43 and younger should be checked for Chlamydia, which is a common sexually transmitted infection. Older women with new or multiple partners should also be tested for Chlamydia. Testing for other STIs is recommended if you are sexually active and at increased risk.  Osteoporosis is a disease in which the bones lose minerals and strength with aging. This can result in serious bone fractures. The risk of osteoporosis can be identified using a bone density scan. Women ages 3 and over and women at risk for fractures or osteoporosis should discuss screening with their caregivers. Ask your caregiver whether you should be taking a calcium supplement or vitamin D to reduce the rate of osteoporosis.  Menopause can be associated with physical symptoms and risks. Hormone replacement therapy is available to decrease symptoms and risks. You should talk to your caregiver about whether hormone replacement therapy is right for you.  Use sunscreen. Apply sunscreen liberally and repeatedly throughout the day. You should seek shade when your shadow is shorter than you. Protect yourself by wearing long sleeves, pants, a wide-brimmed hat, and sunglasses year round, whenever you are outdoors.  Notify your caregiver of new moles or changes in moles, especially if there is a change in shape or color. Also notify your caregiver if a mole is larger than the size of a  pencil eraser.  Stay current with your immunizations. Document Released: 04/29/2011 Document Revised: 02/08/2013 Document Reviewed: 04/29/2011 [facility] Patient Information 2014 [place]. Diabetes and Standards of Medical Care  Diabetes is complicated. You may find that your diabetes team includes a dietitian, nurse, diabetes educator, eye doctor, and more. To help everyone know what is going on and to help you get the care you deserve, the following schedule of care was developed to help keep you on track. Below are the tests, exams, vaccines, medicines, education, and plans you will need. HbA1c test This test shows how well you have controlled your glucose over the past 2 3 months. It is used to  see if your diabetes management plan needs to be adjusted.   It is performed at least 2 times a year if you are meeting treatment goals.  It is performed 4 times a year if therapy has changed or if you are not meeting treatment goals. Blood pressure test  This test is performed at every routine medical visit. The goal is less than 140/90 mmHg for most people, but 130/80 mmHg in some cases. Ask your health care provider about your goal. Dental exam  Follow up with the dentist regularly. Eye exam  If you are diagnosed with type 1 diabetes as a child, get an exam upon reaching the age of 64 years or older and have had diabetes for 3 5 years. Yearly eye exams are recommended after that initial eye exam.  If you are diagnosed with type 1 diabetes as an adult, get an exam within 5 years of diagnosis and then yearly.  If you are diagnosed with type 2 diabetes, get an exam as soon as possible after the diagnosis and then yearly. Foot care exam  Visual foot exams are performed at every routine medical visit. The exams check for cuts, injuries, or other problems with the feet.  A comprehensive foot exam should be done yearly. This includes visual inspection as well as assessing foot pulses and  testing for loss of sensation.  Check your feet nightly for cuts, injuries, or other problems with your feet. Tell your health care provider if anything is not healing. Kidney function test (urine microalbumin)  This test is performed once a year.  Type 1 diabetes: The first test is performed 5 years after diagnosis.  Type 2 diabetes: The first test is performed at the time of diagnosis.  A serum creatinine and estimated glomerular filtration rate (eGFR) test is done once a year to assess the level of chronic kidney disease (CKD), if present. Lipid profile (cholesterol, HDL, LDL, triglycerides)  Performed every 5 years for most people.  The goal for LDL is less than 100 mg/dL. If you are at high risk, the goal is less than 70 mg/dL.  The goal for HDL is 40 mg/dL 50 mg/dL for men and 50 mg/dL 60 mg/dL for women. An HDL cholesterol of 60 mg/dL or higher gives some protection against heart disease.  The goal for triglycerides is less than 150 mg/dL. Influenza vaccine, pneumococcal vaccine, and hepatitis B vaccine  The influenza vaccine is recommended yearly.  The pneumococcal vaccine is generally given once in a lifetime. However, there are some instances when another vaccination is recommended. Check with your health care provider.  The hepatitis B vaccine is also recommended for adults with diabetes. Diabetes self-management education  Education is recommended at diagnosis and ongoing as needed. Treatment plan  Your treatment plan is reviewed at every medical visit. Document Released: 08/11/2009 Document Revised: 06/16/2013 Document Reviewed: 03/16/2013 [facility] Patient Information 2014 [place].

## 2013-11-23 NOTE — Assessment & Plan Note (Signed)
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

## 2013-11-23 NOTE — Assessment & Plan Note (Signed)
Started sertraline SSRI 05/2011, dose increased 08/2011  increase symptoms precipitated by loss of sister with hx same following death of mom/move/spouse illness)-  ok to continue prn BZ for symptoms  -

## 2013-11-23 NOTE — Assessment & Plan Note (Signed)
On metformin; also ARB, ASA 81 -  Started low statin 01/2013  (previosuly declined but then reconsider following spouse MI 02/2012) reports home cbgs "ok" -130s Check a1c now, adjust if needed Lab Results  Component Value Date   HGBA1C 6.9* 06/23/2013

## 2013-11-23 NOTE — Progress Notes (Signed)
Subjective:    Patient ID: Heidi Franklin, female    DOB: 07/05/1946, 68 y.o.   MRN: [id number]  HPI   Here for medicare wellness  Diet: heart healthy or DM if diabetic Physical activity: sedentary Depression/mood screen: negative Hearing: intact to whispered voice Visual acuity: grossly normal, performs annual eye exam  ADLs: capable Fall risk: none Home safety: good Cognitive evaluation: intact to orientation, naming, recall and repetition EOL planning: adv directives, full code/ I agree  I have personally reviewed and have noted 1. The patient's medical and social history 2. Their use of alcohol, tobacco or illicit drugs 3. Their current medications and supplements 4. The patient's functional ability including ADL's, fall risks, home safety risks and hearing or visual impairment. 5. Diet and physical activities 6. Evidence for depression or mood disorders  Also reviewed chronic medical issues and interval events today:  Hypertension, dyslipidemia, DM2, anxiety   Past Medical History  Diagnosis Date  . BRONCHITIS, CHRONIC   . ALLERGIC RHINITIS   . Anxiety state, unspecified   . DEPRESSION   . DIABETES MELLITUS, TYPE II   . GERD   . HYPERLIPIDEMIA   . HYPERTENSION    Family History  Problem Relation Age of Onset  . Stroke Mother   . Diabetes Father   . Hyperlipidemia Other     Parent  . Hypertension Other     Parent   History  Substance Use Topics  . Smoking status: Never Smoker   . Smokeless tobacco: Not on file     Comment: Married, lives with spouse-retired from [employer] insurance  . Alcohol Use: No    Review of Systems  Constitutional: Positive for fatigue. Negative for fever and unexpected weight change.  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   Cardiovascular: Negative for chest pain and leg swelling.  Psychiatric/Behavioral: Positive for sleep disturbance. The patient is  nervous/anxious.   All other systems reviewed and are negative.       Objective:   Physical Exam BP 130/68  Pulse 94  Temp(Src) 98.3 F (36.8 C) (Oral)  Wt 168 lb 1.9 oz (76.259 kg)  SpO2 96% Wt Readings from Last 3 Encounters:  11/23/13 168 lb 1.9 oz (76.259 kg)  10/26/13 169 lb (76.658 kg)  09/24/13 173 lb 4 oz (78.586 kg)   Constitutional: She is overweight, but appears well-developed and well-nourished. No distress.  Neck: Normal range of motion. Neck supple. No JVD present. No thyromegaly present.  Cardiovascular: Normal rate, regular rhythm and normal heart sounds.  No murmur heard. No BLE edema. Pulmonary/Chest: Effort normal and breath sounds normal. No respiratory distress. She has no wheezes.  Skin:scattered firm rubbery nodules subcm consistent with lipomas on B thighs - no erythema or color change Psychiatric: She has a mildly anxious mood and affect. Her behavior is normal. Judgment and thought content normal.    Lab Results  Component Value Date   WBC 10.9* 06/10/2012   HGB 12.2 06/10/2012   HCT 38.5 06/10/2012   PLT 272.0 06/10/2012   GLUCOSE 107* 06/10/2012   CHOL 142 06/23/2013   TRIG 147.0 06/23/2013   HDL 35.60* 06/23/2013   LDLDIRECT 155.1 02/09/2013   LDLCALC 77 06/23/2013   ALT 27 06/23/2013   AST 31 06/23/2013   NA 141 06/10/2012   K 4.1 06/10/2012   CL 103 06/10/2012   CREATININE 0.7 06/10/2012   BUN 14 06/10/2012   CO2 29 06/10/2012   TSH  1.81 06/10/2012   HGBA1C 6.9* 06/23/2013   MICROALBUR 1.7 02/09/2013        Assessment & Plan:    Awv/CPX/v70.0 - Today patient counseled on age appropriate routine health concerns for screening and prevention, each reviewed and up to date or declined. Immunizations reviewed and up to date or declined. Labs ordered and reviewed. Risk factors for depression reviewed and negative. Hearing function and visual acuity are intact. ADLs screened and addressed as needed. Functional ability and level of safety reviewed and  appropriate. Education, counseling and referrals performed based on assessed risks today. Patient provided with a copy of personalized plan for preventive services.  Also see problem list. Medications and labs reviewed today.

## 2013-11-23 NOTE — Progress Notes (Signed)
Pre-visit discussion using our clinic review tool. No additional management support is needed unless otherwise documented below in the visit note.  

## 2013-11-23 NOTE — Progress Notes (Signed)
Heidi Franklin [id number] 11/23/2013   Chief Complaint  Patient presents with  . Follow-up    4 MONTHS    Subjective  HPI Patient is here for 6 month follow up.  Chronic medical conditions reviewed as well as medical interventions.  Patient has no new complaints.   Past Medical History  Diagnosis Date  . BRONCHITIS, CHRONIC   . ALLERGIC RHINITIS   . Anxiety state, unspecified   . DEPRESSION   . DIABETES MELLITUS, TYPE II   . GERD   . HYPERLIPIDEMIA   . HYPERTENSION     Past Surgical History  Procedure Laterality Date  . Abdominal hysterectomy  1980  . Trigger finger release      Index finger    Family History  Problem Relation Age of Onset  . Stroke Mother   . Diabetes Father   . Hyperlipidemia Other     Parent  . Hypertension Other     Parent    History  Substance Use Topics  . Smoking status: Never Smoker   . Smokeless tobacco: Not on file     Comment: Married, lives with spouse-retired from [employer] insurance  . Alcohol Use: No     Allergies:  Allergies  Allergen Reactions  . Penicillins     Review of Systems  Constitutional: Negative for fever and chills.  Respiratory: Positive for cough (chronic). Negative for hemoptysis, sputum production, shortness of breath and wheezing.   Cardiovascular: Negative for chest pain, palpitations, orthopnea and leg swelling.  Gastrointestinal: Negative for nausea, vomiting and abdominal pain.  Neurological: Negative for headaches.  Psychiatric/Behavioral: Positive for depression (Recent death (sister 2x weeks)). Negative for suicidal ideas and substance abuse. The patient is not nervous/anxious and does not have insomnia.        Objective  Filed Vitals:   11/23/13 1109  BP: 130/68  Pulse: 94  Temp: 98.3 F (36.8 C)  TempSrc: Oral  Weight: 168 lb 1.9 oz (76.259 kg)  SpO2: 96%    Physical Exam  Nursing note and vitals reviewed. Constitutional: She is oriented to person, place, and time. She appears  well-developed and well-nourished. No distress.  HENT:  Head: Normocephalic and atraumatic.  Eyes: Conjunctivae are normal. Pupils are equal, round, and reactive to light.  Neck: No JVD present. No tracheal deviation present.  Cardiovascular: Normal rate, regular rhythm and normal heart sounds.   Respiratory: Effort normal. No stridor. No respiratory distress. She has no wheezes. She exhibits no tenderness.  Neurological: She is alert and oriented to person, place, and time. No cranial nerve deficit. Coordination normal.  Skin: She is not diaphoretic.  Psychiatric: Judgment and thought content normal.    BP Readings from Last 3 Encounters:  11/23/13 130/68  10/26/13 130/76  09/24/13 124/80    Wt Readings from Last 3 Encounters:  11/23/13 168 lb 1.9 oz (76.259 kg)  10/26/13 169 lb (76.658 kg)  09/24/13 173 lb 4 oz (78.586 kg)    Lab Results  Component Value Date   WBC 10.9* 06/10/2012   HGB 12.2 06/10/2012   HCT 38.5 06/10/2012   PLT 272.0 06/10/2012   GLUCOSE 107* 06/10/2012   CHOL 142 06/23/2013   TRIG 147.0 06/23/2013   HDL 35.60* 06/23/2013   LDLDIRECT 155.1 02/09/2013   LDLCALC 77 06/23/2013   ALT 27 06/23/2013   AST 31 06/23/2013   NA 141 06/10/2012   K 4.1 06/10/2012   CL 103 06/10/2012   CREATININE 0.7 06/10/2012   BUN 14 06/10/2012  CO2 29 06/10/2012   TSH 1.81 06/10/2012   HGBA1C 6.9* 06/23/2013   MICROALBUR 1.7 02/09/2013    Dg Chest 2 View  11/23/2012   *RADIOLOGY REPORT*  Clinical Data: Chronic cough.  CHEST - 2 VIEW  Comparison: 12 03/05/2011.  Findings: The cardiac silhouette, mediastinal and hilar contours are normal.  The lungs are clear.  No pleural effusion.  The bony thorax is intact.  IMPRESSION: No acute cardiopulmonary findings.   Original Report Authenticated By: Marijo Sanes, M.D.     Assessment and Plan  Type II diabetes-   On metformin, ARB,  started Statin on  01/2013, Patients reports no new complaints, or problems with medications.   Home CBG monitoring  reports 130 average.  Will draw HgA1C today and adjust dose as needed.   Hypertension - On Amlodipine (dose increase 05/31/13),   The current medical regimen is effective;  continue present plan and medications.  Hyperlipidemia- On lovastatin (02/09/13)  Last labs  On 06/23/13 within normal limits.  No medication complaints or side effects.   Will draw lipid panel annually to asses medication effectiveness.   Will adjust dose as needed.   Anxiety-  Increase in anxiety due to recent passing of sibling (sister 2x weeks)  Patient reports No SI/HI.  Patients reports compliance with current medications and does not wish to alter medications. Emotional support provided today. The current medical regimen is effective;  continue present plan and medications.  Patient was instructed to notify the office if any new symptoms emerged or the current symptoms become worse.  Return in about 4 months (around 03/23/2014).  Donna Christen    I have personally reviewed this case with PA student. I also personally examined this patient. I agree with history and findings as documented above. I reviewed, discussed and approve of the assessment and plan as listed above. [name], MD

## 2013-11-24 ENCOUNTER — Encounter: Payer: Self-pay | Admitting: Internal Medicine

## 2013-11-25 ENCOUNTER — Encounter: Payer: Medicare Other | Admitting: Adult Health

## 2013-11-30 ENCOUNTER — Encounter: Payer: Medicare Other | Admitting: Adult Health

## 2013-12-06 ENCOUNTER — Encounter: Payer: Self-pay | Admitting: Adult Health

## 2013-12-06 ENCOUNTER — Ambulatory Visit (INDEPENDENT_AMBULATORY_CARE_PROVIDER_SITE_OTHER): Payer: Medicare Other | Admitting: Adult Health

## 2013-12-06 VITALS — BP 114/58 | HR 91 | Temp 98.6°F | Ht 60.0 in | Wt 169.2 lb

## 2013-12-06 DIAGNOSIS — J45991 Cough variant asthma: Secondary | ICD-10-CM | POA: Diagnosis not present

## 2013-12-06 NOTE — Patient Instructions (Signed)
We are setting you up for a methacholine challenge test  Follow med calendar closely and bring to each visit.  Follow up Dr. Melvyn Novas  After test to discuss results.

## 2013-12-06 NOTE — Assessment & Plan Note (Addendum)
Improved control w/ less cough on trigger prevention regimen  No flare off symbicort .  Patient's medications were reviewed today and patient education was given. Computerized medication calendar was adjusted/completed  Plan  Set up methacholine challenge test  follow up with Dr. Melvyn Novas  For result review

## 2013-12-06 NOTE — Progress Notes (Signed)
Subjective:     Patient ID: Heidi Franklin, female   DOB: 1946/10/17    MRN: [id number]  Brief patient profile:   5 yobf never smoker with chronic cough  since age 68's only resolves for a week at a time then recurs daily,  dx as  asthma, post-nasal drip with nasal septal deviation, and GERD.   HPI 04/17/2012 Acute ov/Wert   cc  Cough only about ever 50% improved at baseline then much worse x 5 days,  most notable after lie down but  also during the day,  Producing minimal clear mucus on mucinex - not able to use qvar effectively and having to use lots of saba which helps some but prednisone helps the most.   no assoc overt sinus or reflux symptoms >>added PPI/Pepcid and changed from qvar to symbicort   05/05/2012 Follow up and med review Returns for follow up and med review  Reviewed all her meds and organized them into a med calendar with pt education  Appears to be taking correctly.  Improved from last ov -started on Symbicort and GERD regimen.  Tolerating well.  Decreased dyspnea and wheezing.  No chest pain or edema.  rec Continue on current regimen .  Brush/rinse and gargle after inhaler use.  Follow med calendar closely and bring to each visit.  follow up Dr. Melvyn Novas  In 3-4 months and As needed     08/03/2012 f/u ov/Wert cc problems for decades, better on present rx than for many years prior to pulmonary clinic changes, not using calendar, poor hfa  technique   but no limiting sob.  rec Change zyrtec to where you take it only if needed for itching/ sneezing  Work on inhaler technique:      Ok to use symbicort with spacer if not improving   11/04/2012 f/u ov/Wert did not bring calendar and says cough only a little better really unchanged for years x a after prednisone, still with poor hfa technique and using saba twice daily, no limiting sob. >steroid taper   11/23/2012 Follow up and Med calendar    Patient returns for a two-week followup and medication review. We reviewed all her  medications and organized them into a medication calendar with patient education. It appears the patient is taking her medications correctly. Last visit. Patient was given a steroid taper for flare of her cough. She was also changed from atenolol to bystolic  She says that her cough got better briefly but never totally went away, and over the last 3-4 days. Cough has flared back with intermittent congestion, hoarseness, and, harsh, barking cough at times. rec Try zyrtec > chlorpheniramine   03/04/2013 f/u ov/Wert re asthma with prominent cough Chief Complaint  Patient presents with  . Follow-up    Pt c/o increased cough x 2 wks- non prod and worse when she goes outside. Also, she is having increased SOB for the past wk   assoc with hoarsensess, day > night, using xopenex as rescue maybe twice daily? helps some  Has med calendar not really following action plan and unsure of names of meds including symbicort >>pred taper    03/18/2013 Follow up and med review  Patient returns for a two-week followup and medication review. We reviewed all her medications and organized them into a medication calendar with patient education. It appears the patient is taking her medications correctly. Last visit. Patient was given a steroid taper for flare of her cough. Cough is better.  Never goes entirely  away, has tickle in throat on /off.  rec No change rx use med calendar   06/16/2013 f/u ov/Wert  Chief Complaint  Patient presents with  . Follow-up    States that cough is slightly improved.   overall much better no recent attacks but still sensation drainage even on prednisone on prn chlortrimeton more day than night but no purulent secretions. No sob or need for albuterol prn rec change chlortrimeton 4 mg at hs and prn   07/29/2013 f/u ov/Wert re: chronic cough since age 47  Chief Complaint  Patient presents with  . Follow-up    Breathing is some worse since the last visit. She is needing  xopenex for rescue approx 2 x per wk. She c/o hoarseness and ear pain.    >>Neurontin Three times a day  rx , change b/p meds to tribenzor  08/26/2013 Follow up and Med review  Returns for follow up and med review.  We reviewed all her meds and organized them into a med calendar with pt education. Pt did not fill neurontin rx .  Can not afford Tribenzor and wants to go back to norvasc and losartan. Cough is some better . Comes and goes. Seen by ENT says laryngoscopy was benign. No records noted. Does still have PND, HA, bilateral ear congestion/popping. Using chlortrimeton At bedtime  Advised can use more often if needed.  rec No change in rx  10/26/2013 f/u ov/Wert re: chronic cough back on cozar Chief Complaint  Patient presents with  . Follow-up    Breathing and cough doing well.  Over weekend developed cough non-produtive  says never really completely free of cough, now worse, not compliant with symbicort.  >Stop losartan, Start diovan 160 /25 one daily  Change symbicort to 2 puffs every 12 hours As needed  Cough/wheeze    12/06/2013 Follow up and Med review  Patient returns for a followup visit and medication review. We reviewed all her medications organized them into a medication calendar with patient education. Appears the patient is taking her medications correctly. Since last visit. Patient reports that she is doing better. Cough is not as bad. Last ov Symbicort was changed to As needed  Use only.  She is using on average every other day. Not sure if she needs or not.  No fever, chest pain , edema or wheezing. No xopenex use since last ov.    Current Medications, Allergies, Complete Past Medical History, Past Surgical History, Family History, and Social History were reviewed in [employer] record.  ROS  The following are not active complaints unless bolded sore throat, dysphagia, dental problems, itching, sneezing,  nasal congestion or excess/ purulent  secretions, ear ache,   fever, chills, sweats, unintended wt loss, pleuritic or exertional cp, hemoptysis,  orthopnea pnd or leg swelling, presyncope, palpitations, heartburn, abdominal pain, anorexia, nausea, vomiting, diarrhea  or change in bowel or urinary habits, change in stools or urine, dysuria,hematuria,  rash, arthralgias, visual complaints, headache, numbness weakness or ataxia or problems with walking or coordination,  change in mood/affect or memory.              Objective:   Physical Exam  Exam:  GEN: A/Ox3; pleasant , NAD, well nad mild hoarse amb bf nad   HEENT:  [place]/AT,  EACs-clear, TMs-wnl, NOSE-clear drainage  THROAT-clear, no lesions, no postnasal drip or exudate noted.   NECK:  Supple w/ fair ROM; no JVD; normal carotid impulses w/o bruits; no thyromegaly or nodules  palpated; no lymphadenopathy.  RESP  Diminished BS in bases, no accessory muscle use, no dullness to percussion  CARD:  RRR, no m/r/g  , no peripheral edema, pulses intact, no cyanosis or clubbing.  GI:   Soft & nt; nml bowel sounds; no organomegaly or masses detected.  Musco: Warm bil, no deformities or joint swelling noted.   Neuro: alert, no focal deficits noted.    Skin: Warm, no lesions or rashes     Assessment:

## 2013-12-09 ENCOUNTER — Encounter (HOSPITAL_COMMUNITY): Payer: Medicare Other

## 2013-12-14 ENCOUNTER — Other Ambulatory Visit: Payer: Medicare Other

## 2013-12-17 ENCOUNTER — Encounter (HOSPITAL_COMMUNITY): Payer: Medicare Other

## 2013-12-17 ENCOUNTER — Other Ambulatory Visit: Payer: Self-pay | Admitting: Internal Medicine

## 2013-12-17 NOTE — Telephone Encounter (Signed)
Faxed script back to [facility]...lmb 

## 2013-12-20 ENCOUNTER — Ambulatory Visit (INDEPENDENT_AMBULATORY_CARE_PROVIDER_SITE_OTHER)
Admission: RE | Admit: 2013-12-20 | Discharge: 2013-12-20 | Disposition: A | Discharge status: Home / Self Care | Payer: Medicare Other | Source: Ambulatory Visit | Attending: Internal Medicine | Admitting: Internal Medicine

## 2013-12-20 DIAGNOSIS — Z13820 Encounter for screening for osteoporosis: Secondary | ICD-10-CM

## 2013-12-24 ENCOUNTER — Ambulatory Visit (HOSPITAL_COMMUNITY)
Admission: RE | Admit: 2013-12-24 | Discharge: 2013-12-24 | Disposition: A | Discharge status: Home / Self Care | Payer: Medicare Other | Source: Ambulatory Visit | Attending: Internal Medicine | Admitting: Internal Medicine

## 2013-12-24 DIAGNOSIS — J45909 Unspecified asthma, uncomplicated: Secondary | ICD-10-CM | POA: Diagnosis not present

## 2013-12-24 DIAGNOSIS — J45991 Cough variant asthma: Secondary | ICD-10-CM

## 2013-12-24 LAB — PULMONARY FUNCTION TEST
FEF 25-75 Post: 1.75 L/sec
FEF 25-75 Pre: 1.93 L/sec
FEF2575-%Change-Post: -9 %
FEF2575-%Pred-Post: 111 %
FEF2575-%Pred-Pre: 122 %
FEV1-%Change-Post: -1 %
FEV1-%Pred-Post: 102 %
FEV1-%Pred-Pre: 104 %
FEV1-Post: 1.69 L
FEV1-Pre: 1.72 L
FEV1FVC-%Change-Post: 0 %
FEV1FVC-%Pred-Pre: 103 %
FEV6-%Change-Post: 0 %
FEV6-%Pred-Post: 103 %
FEV6-%Pred-Pre: 103 %
FEV6-Post: 2.11 L
FEV6-Pre: 2.11 L
FEV6FVC-%Pred-Post: 104 %
FEV6FVC-%Pred-Pre: 104 %
FVC-%Change-Post: -1 %
FVC-%Pred-Post: 99 %
FVC-%Pred-Pre: 100 %
FVC-Post: 2.11 L
FVC-Pre: 2.14 L
Post FEV1/FVC ratio: 80 %
Post FEV6/FVC ratio: 100 %
Pre FEV1/FVC ratio: 81 %
Pre FEV6/FVC Ratio: 100 %

## 2013-12-24 MED ORDER — METHACHOLINE 4 MG/ML NEB SOLN
2.0000 mL | Freq: Once | RESPIRATORY_TRACT | Status: AC
  Administered 2013-12-24: 8 mg via RESPIRATORY_TRACT

## 2013-12-24 MED ORDER — SODIUM CHLORIDE 0.9 % IN NEBU
3.0000 mL | INHALATION_SOLUTION | Freq: Once | RESPIRATORY_TRACT | Status: AC
  Administered 2013-12-24: 3 mL via RESPIRATORY_TRACT

## 2013-12-24 MED ORDER — METHACHOLINE 0.0625 MG/ML NEB SOLN
2.0000 mL | Freq: Once | RESPIRATORY_TRACT | Status: AC
  Administered 2013-12-24: 0.125 mg via RESPIRATORY_TRACT

## 2013-12-24 MED ORDER — ALBUTEROL SULFATE (2.5 MG/3ML) 0.083% IN NEBU
2.5000 mg | INHALATION_SOLUTION | Freq: Once | RESPIRATORY_TRACT | Status: AC
  Administered 2013-12-24: 2.5 mg via RESPIRATORY_TRACT

## 2013-12-24 MED ORDER — METHACHOLINE 1 MG/ML NEB SOLN
2.0000 mL | Freq: Once | RESPIRATORY_TRACT | Status: AC
  Administered 2013-12-24: 2 mg via RESPIRATORY_TRACT

## 2013-12-24 MED ORDER — METHACHOLINE 16 MG/ML NEB SOLN
2.0000 mL | Freq: Once | RESPIRATORY_TRACT | Status: AC
  Administered 2013-12-24: 32 mg via RESPIRATORY_TRACT

## 2013-12-24 MED ORDER — METHACHOLINE 0.25 MG/ML NEB SOLN
2.0000 mL | Freq: Once | RESPIRATORY_TRACT | Status: AC
  Administered 2013-12-24: 0.5 mg via RESPIRATORY_TRACT

## 2013-12-27 NOTE — Addendum Note (Signed)
Addended by: Parke Poisson E on: 12/27/2013 10:10 AM   Modules accepted: Orders

## 2013-12-28 ENCOUNTER — Ambulatory Visit (AMBULATORY_SURGERY_CENTER): Payer: Self-pay | Admitting: *Deleted

## 2013-12-28 ENCOUNTER — Encounter: Payer: Self-pay | Admitting: Internal Medicine

## 2013-12-28 VITALS — Ht 60.0 in | Wt 169.6 lb

## 2013-12-28 DIAGNOSIS — Z1211 Encounter for screening for malignant neoplasm of colon: Secondary | ICD-10-CM

## 2013-12-28 MED ORDER — MOVIPREP 100 G PO SOLR: 1.0000 | Freq: Once | ORAL | Status: DC

## 2013-12-28 NOTE — Progress Notes (Signed)
Denies allergies to eggs or soy products. Denies complications with sedation or anesthesia. 

## 2013-12-29 NOTE — Progress Notes (Signed)
Quick Note:  Spoke with pt and notified of results per Dr. Wert. Pt verbalized understanding and denied any questions.  ______ 

## 2013-12-31 ENCOUNTER — Ambulatory Visit (INDEPENDENT_AMBULATORY_CARE_PROVIDER_SITE_OTHER): Payer: Medicare Other | Admitting: Internal Medicine

## 2013-12-31 ENCOUNTER — Encounter: Payer: Self-pay | Admitting: Internal Medicine

## 2013-12-31 VITALS — BP 134/64 | HR 82 | Temp 98.2°F | Ht 60.0 in | Wt 170.2 lb

## 2013-12-31 DIAGNOSIS — J45991 Cough variant asthma: Secondary | ICD-10-CM | POA: Diagnosis not present

## 2013-12-31 DIAGNOSIS — J310 Chronic rhinitis: Secondary | ICD-10-CM

## 2013-12-31 NOTE — Patient Instructions (Addendum)
Try off singulair to see what difference if any this makes  See calendar for specific medication instructions and bring it back for each and every office visit for every healthcare provider you see.  Without it,  you may not receive the best quality medical care that we feel you deserve.  You will note that the calendar groups together  your maintenance  medications that are timed at particular times of the day.  Think of this as your checklist for what your doctor has instructed you to do until your next evaluation to see what benefit  there is  to staying on a consistent group of medications intended to keep you well.  The other group at the bottom is entirely up to you to use as you see fit  for specific symptoms that may arise between visits that require you to treat them on an as needed basis.  Think of this as your action plan or "what if" list.   Separating the top medications from the bottom group is fundamental to providing you adequate care going forward.    Please schedule a follow up visit in 3 months but call sooner if needed

## 2013-12-31 NOTE — Assessment & Plan Note (Addendum)
PFT 11/07/11>>FEV1 1.79 (102%),  Ratio 82, TLC 4.18 (104%), DLCO 69%, positive BD only in terms of fef 25/75  - hfa75% 03/04/2013  -med calendar 11/23/2012 , 03/18/2013 , 08/26/2013 , 12/06/2013 -methacholine challenge test  12/24/13 > neg   I had an extended discussion with the patient today lasting 15 to 20 minutes of a 25 minute visit on the following issues:    Each maintenance medication was reviewed in detail including most importantly the difference between maintenance and as needed and under what circumstances the prns are to be used. This was done in the context of a medication calendar review which provided the patient with a user-friendly unambiguous mechanism for medication administration and reconciliation and provides an action plan for all active problems. It is critical that this be shown to every doctor  for modification during the office visit if necessary so the patient can use it as a working document.       - try off singulair trial basis andonly use symbicort if cough/wheeze, or worse doe

## 2013-12-31 NOTE — Progress Notes (Signed)
Subjective:     Patient ID: Heidi Franklin, female   DOB: 06/25/1946    MRN: [id number]  Brief patient profile:   54 yobf never smoker with chronic cough  since age 68's only resolves for a week at a time then recurs daily,  dx as  asthma, post-nasal drip with nasal septal deviation, and GERD with neg MCT 11/2013    HPI   08/03/2012 f/u ov/Heidi Franklin cc problems for decades, better on present rx than for many years prior to pulmonary clinic changes, not using calendar, poor hfa  technique   but no limiting sob.  rec Change zyrtec to where you take it only if needed for itching/ sneezing  Work on inhaler technique:      Ok to use symbicort with spacer if not improving   07/29/2013 f/u ov/Heidi Franklin re: chronic cough since age 52  Chief Complaint  Patient presents with  . Follow-up    Breathing is some worse since the last visit. She is needing xopenex for rescue approx 2 x per wk. She c/o hoarseness and ear pain.    >>Neurontin Three times a day  rx , change b/p meds to tribenzor  08/26/2013 Follow up and Med review  Returns for follow up and med review.  We reviewed all her meds and organized them into a med calendar with pt education. Pt did not fill neurontin rx .  Can not afford Tribenzor and wants to go back to norvasc and losartan. Cough is some better . Comes and goes. Seen by ENT says laryngoscopy was benign. No records noted. Does still have PND, HA, bilateral ear congestion/popping. Using chlortrimeton At bedtime  Advised can use more often if needed.  rec No change in rx  10/26/2013 f/u ov/Heidi Franklin re: chronic cough back on cozar Chief Complaint  Patient presents with  . Follow-up    Breathing and cough doing well.  Over weekend developed cough non-produtive  says never really completely free of cough, now worse, not compliant with symbicort.  >Stop losartan, Start diovan 160 /25 one daily  Change symbicort to 2 puffs every 12 hours As needed  Cough/wheeze    12/06/2013 Follow up and Med  review  Patient returns for a followup visit and medication review. We reviewed all her medications organized them into a medication calendar with patient education. Appears the patient is taking her medications correctly. Since last visit. Patient reports that she is doing better. Cough is not as bad. Last ov Symbicort was changed to As needed  Use only.  She is using on average every other day. Not sure if she needs or not.  No fever, chest pain , edema or wheezing. No xopenex use since last ov.  rec MCT > neg  12/31/2013 f/u ov/Heidi Franklin re: uacs Chief Complaint  Patient presents with  . Follow-up    States breathing is doing well. C/o SOB with moderate activity and dry cough. Denies CP.    only gets a tickle in throat when out in crowds, still using symbicort qod but no change on days when uses it. Not really limited from desired activities by cough or sob  No obvious day to day or daytime variabilty or assoc  cp or chest tightness, subjective wheeze overt sinus or hb symptoms. No unusual exp hx or h/o childhood pna/ asthma or knowledge of premature birth.  Sleeping ok without nocturnal  or early am exacerbation  of respiratory  c/o's or need for noct saba. Also denies any obvious  fluctuation of symptoms with weather or environmental changes or other aggravating or alleviating factors except as outlined above   Current Medications, Allergies, Complete Past Medical History, Past Surgical History, Family History, and Social History were reviewed in [employer] record.  ROS  The following are not active complaints unless bolded sore throat, dysphagia, dental problems, itching, sneezing,  nasal congestion or excess/ purulent secretions, ear ache,   fever, chills, sweats, unintended wt loss, pleuritic or exertional cp, hemoptysis,  orthopnea pnd or leg swelling, presyncope, palpitations, heartburn, abdominal pain, anorexia, nausea, vomiting, diarrhea  or change in bowel or  urinary habits, change in stools or urine, dysuria,hematuria,  rash, arthralgias, visual complaints, headache, numbness weakness or ataxia or problems with walking or coordination,  change in mood/affect or memory.                           Objective:   Physical Exam  Exam:  GEN: A/Ox3; pleasant , NAD, well nad mild hoarse amb bf nad   HEENT:  [place]/AT,  EACs-clear, TMs-wnl, NOSE-clear drainage  THROAT-clear, no lesions, no postnasal drip or exudate noted.   NECK:  Supple w/ fair ROM; no JVD; normal carotid impulses w/o bruits; no thyromegaly or nodules palpated; no lymphadenopathy.  RESP  Diminished BS in bases, no accessory muscle use, no dullness to percussion  CARD:  RRR, no m/r/g  , no peripheral edema, pulses intact, no cyanosis or clubbing.  GI:   Soft & nt; nml bowel sounds; no organomegaly or masses detected.  Musco: Warm bil, no deformities or joint swelling noted.   Neuro: alert, no focal deficits noted.    Skin: Warm, no lesions or rashes     Assessment:

## 2013-12-31 NOTE — Assessment & Plan Note (Signed)
CT sinus 11/08/11>>Negative paranasal sinuses.  Rightward nasal septal deviation and possible rhinitis. 01/13/12>>RAST negative, IgE 22.7  slt nose bleed from flonase, reviewed optimal rx / inhalation strategy to avoid septum deposition  Seems to respond the best to 1st gen H1

## 2014-01-07 ENCOUNTER — Encounter: Payer: Self-pay | Admitting: Internal Medicine

## 2014-01-11 ENCOUNTER — Ambulatory Visit (AMBULATORY_SURGERY_CENTER): Payer: Medicare Other | Admitting: Internal Medicine

## 2014-01-11 ENCOUNTER — Encounter: Payer: Self-pay | Admitting: Internal Medicine

## 2014-01-11 VITALS — BP 123/62 | HR 85 | Temp 98.3°F | Resp 18 | Ht 60.0 in | Wt 169.0 lb

## 2014-01-11 DIAGNOSIS — I10 Essential (primary) hypertension: Secondary | ICD-10-CM | POA: Diagnosis not present

## 2014-01-11 DIAGNOSIS — E119 Type 2 diabetes mellitus without complications: Secondary | ICD-10-CM | POA: Diagnosis not present

## 2014-01-11 DIAGNOSIS — K219 Gastro-esophageal reflux disease without esophagitis: Secondary | ICD-10-CM | POA: Diagnosis not present

## 2014-01-11 DIAGNOSIS — Z1211 Encounter for screening for malignant neoplasm of colon: Secondary | ICD-10-CM | POA: Diagnosis not present

## 2014-01-11 LAB — GLUCOSE, CAPILLARY
Glucose-Capillary: 128 mg/dL — ABNORMAL HIGH (ref 70–99)
Glucose-Capillary: 87 mg/dL (ref 70–99)

## 2014-01-11 MED ORDER — SODIUM CHLORIDE 0.9 % IV SOLN: 500.0000 mL | INTRAVENOUS | Status: DC

## 2014-01-11 NOTE — Patient Instructions (Signed)
YOU HAD AN ENDOSCOPIC PROCEDURE TODAY AT THE [facility]: Refer to the procedure report that was given to you for any specific questions about what was found during the examination.  If the procedure report does not answer your questions, please call your gastroenterologist to clarify.  If you requested that your care partner not be given the details of your procedure findings, then the procedure report has been included in a sealed envelope for you to review at your convenience later.  YOU SHOULD EXPECT: Some feelings of bloating in the abdomen. Passage of more gas than usual.  Walking can help get rid of the air that was put into your GI tract during the procedure and reduce the bloating. If you had a lower endoscopy (such as a colonoscopy or flexible sigmoidoscopy) you may notice spotting of blood in your stool or on the toilet paper. If you underwent a bowel prep for your procedure, then you may not have a normal bowel movement for a few days.  DIET: Your first meal following the procedure should be a light meal and then it is ok to progress to your normal diet.  A half-sandwich or bowl of soup is an example of a good first meal.  Heavy or fried foods are harder to digest and may make you feel nauseous or bloated.  Likewise meals heavy in dairy and vegetables can cause extra gas to form and this can also increase the bloating.  Drink plenty of fluids but you should avoid alcoholic beverages for 24 hours.  ACTIVITY: Your care partner should take you home directly after the procedure.  You should plan to take it easy, moving slowly for the rest of the day.  You can resume normal activity the day after the procedure however you should NOT DRIVE or use heavy machinery for 24 hours (because of the sedation medicines used during the test).    SYMPTOMS TO REPORT IMMEDIATELY: A gastroenterologist can be reached at any hour.  During normal business hours, 8:30 AM to 5:00 PM Monday through Friday,  call [phone].  After hours and on weekends, please call the GI answering service at [phone] who will take a message and have the physician on call contact you.   Following lower endoscopy (colonoscopy or flexible sigmoidoscopy):  Excessive amounts of blood in the stool  Significant tenderness or worsening of abdominal pains  Swelling of the abdomen that is new, acute  Fever of 100F or higher  FOLLOW UP: If any biopsies were taken you will be contacted by phone or by letter within the next 1-3 weeks.  Call your gastroenterologist if you have not heard about the biopsies in 3 weeks.  Our staff will call the home number listed on your records the next business day following your procedure to check on you and address any questions or concerns that you may have at that time regarding the information given to you following your procedure. This is a courtesy call and so if there is no answer at the home number and we have not heard from you through the emergency physician on call, we will assume that you have returned to your regular daily activities without incident.  SIGNATURES/CONFIDENTIALITY: You and/or your care partner have signed paperwork which will be entered into your electronic medical record.  These signatures attest to the fact that that the information above on your After Visit Summary has been reviewed and is understood.  Full responsibility of the confidentiality of this   discharge information lies with you and/or your care-partner.  Recommendations Continue current colorectal screening for "routine risk" patients with a repeat colonoscopy in 10 years.

## 2014-01-11 NOTE — Progress Notes (Signed)
Lidocaine-40mg IV prior to Propofol InductionPropofol given over incremental dosages 

## 2014-01-11 NOTE — Op Note (Signed)
[facility]  [address]. [place], [address]   COLONOSCOPY PROCEDURE REPORT  [name], Heidi Franklin  MR#: [id number] BIRTHDATE: 1946-01-04 , 75  yrs. old GENDER: Female ENDOSCOPIST: Eustace Quail, MD REFERRED PN:[id number] Asa Lente, M.D. PROCEDURE DATE:  01/11/2014 PROCEDURE:   Colonoscopy, screening First Screening Colonoscopy - Avg.  risk and is 50 yrs.  old or older - No.  Prior Negative Screening - Now for repeat screening. 10 or more years since last screening  History of Adenoma - Now for follow-up colonoscopy & has been > or = to 3 yrs.  N/A  Polyps Removed Today? No.  Recommend repeat exam, <10 yrs? No. ASA CLASS:   Class II INDICATIONS:average risk screening. MEDICATIONS: MAC sedation, administered by CRNA and propofol (Diprivan) 150mg  IV  DESCRIPTION OF PROCEDURE:   After the risks benefits and alternatives of the procedure were thoroughly explained, informed consent was obtained.  A digital rectal exam revealed no abnormalities of the rectum.   The LB [id number]  endoscope was introduced through the anus and advanced to the cecum, which was identified by both the appendix and ileocecal valve. No adverse events experienced.   The quality of the prep was excellent, using MoviPrep  The instrument was then slowly withdrawn as the colon was fully examined.      COLON FINDINGS: A normal appearing cecum, ileocecal valve, and appendiceal orifice were identified.  The ascending, hepatic flexure, transverse, splenic flexure, descending, sigmoid colon and rectum appeared unremarkable.  No polyps or cancers were seen. Retroflexed views revealed no abnormalities. The time to cecum=3 minutes 15 seconds.  Withdrawal time=8 minutes 44 seconds.  The scope was withdrawn and the procedure completed.  COMPLICATIONS: There were no complications.  ENDOSCOPIC IMPRESSION: 1. Normal colon  RECOMMENDATIONS: 1.Continue current colorectal screening  recommendations for "routine risk" patients with a repeat colonoscopy in 10 years.   eSigned:  Eustace Quail, MD 01/11/2014 10:23 AM   cc: Rowe Clack, MD and The Patient

## 2014-01-12 ENCOUNTER — Telehealth: Payer: Self-pay | Admitting: *Deleted

## 2014-01-12 NOTE — Telephone Encounter (Signed)
  Follow up Call-  Call back number 01/11/2014  Post procedure Call Back phone  # [phone] hm  Permission to leave phone message Yes     Patient questions:  Do you have a fever, pain , or abdominal swelling? no Pain Score  0 *  Have you tolerated food without any problems? yes  Have you been able to return to your normal activities? yes  Do you have any questions about your discharge instructions: Diet   no Medications  no Follow up visit  no  Do you have questions or concerns about your Care? no  Actions: * If pain score is 4 or above: No action needed, pain <4.

## 2014-01-18 ENCOUNTER — Encounter: Payer: Self-pay | Admitting: Internal Medicine

## 2014-01-25 ENCOUNTER — Ambulatory Visit: Payer: Medicare Other | Admitting: Internal Medicine

## 2014-02-03 ENCOUNTER — Ambulatory Visit (INDEPENDENT_AMBULATORY_CARE_PROVIDER_SITE_OTHER): Payer: Medicare Other | Admitting: Internal Medicine

## 2014-02-03 ENCOUNTER — Encounter: Payer: Self-pay | Admitting: Internal Medicine

## 2014-02-03 ENCOUNTER — Other Ambulatory Visit (INDEPENDENT_AMBULATORY_CARE_PROVIDER_SITE_OTHER): Payer: Medicare Other

## 2014-02-03 VITALS — BP 138/70 | HR 94 | Temp 98.6°F | Wt 168.8 lb

## 2014-02-03 DIAGNOSIS — E1149 Type 2 diabetes mellitus with other diabetic neurological complication: Secondary | ICD-10-CM

## 2014-02-03 DIAGNOSIS — R197 Diarrhea, unspecified: Secondary | ICD-10-CM | POA: Diagnosis not present

## 2014-02-03 DIAGNOSIS — R229 Localized swelling, mass and lump, unspecified: Secondary | ICD-10-CM | POA: Diagnosis not present

## 2014-02-03 DIAGNOSIS — H8110 Benign paroxysmal vertigo, unspecified ear: Secondary | ICD-10-CM

## 2014-02-03 LAB — HEMOGLOBIN A1C: Hgb A1c MFr Bld: 6.9 % — ABNORMAL HIGH (ref 4.6–6.5)

## 2014-02-03 MED ORDER — METFORMIN HCL 500 MG PO TABS: 500.0000 mg | ORAL_TABLET | Freq: Every day | ORAL | Status: DC

## 2014-02-03 MED ORDER — MECLIZINE HCL 25 MG PO TABS: 25.0000 mg | ORAL_TABLET | Freq: Three times a day (TID) | ORAL | Status: DC | PRN

## 2014-02-03 NOTE — Assessment & Plan Note (Signed)
On metformin; also ARB, ASA 81 -  Reduce metformin now given GI side effects and A1c less than 7 Started low statin 01/2013  (previosuly declined but then reconsider following spouse MI 02/2012) reports home cbgs "ok" -130s Check a1c now, adjust further if needed Lab Results  Component Value Date   HGBA1C 6.9* 11/23/2013

## 2014-02-03 NOTE — Patient Instructions (Signed)
It was good to see you today.  We have reviewed your prior records including labs and tests today  Test(s) ordered today. Your results will be released to [place] (or called to you) after review, usually within 72hours after test completion. If any changes need to be made, you will be notified at that same time.  Medications reviewed and updated Decrease metformin to 1 tablet before breakfast each day Use meclizine as needed for dizziness symptoms No other changes recommended  Your prescription(s) have been submitted to your pharmacy. Please take as directed and contact our office if you believe you are having problem(s) with the medication(s).  we'll make referral to dermatology for your skin nodule. Our office will contact you regarding appointment(s) once made. Until that time, use anti-itch cream such as cortisone 4 times daily as needed  If diarrhea symptoms recur, or if bowel changes unimproved with reduced metformin, please send [place] a message or schedule visit for further evaluation of same  Please schedule followup in 6 months, call sooner if problems.

## 2014-02-03 NOTE — Progress Notes (Signed)
Pre visit review using our clinic review tool, if applicable. No additional management support is needed unless otherwise documented below in the visit note. 

## 2014-02-03 NOTE — Progress Notes (Signed)
Subjective:    Patient ID: Heidi Franklin, female    DOB: 22-May-1946, 68 y.o.   MRN: [id number]  Diarrhea  This is a chronic problem. Episode onset: worse 2 weeks ago with liquid x 1 week. The problem has been gradually improving. The stool consistency is described as watery. The patient states that diarrhea does not awaken her from sleep. Pertinent negatives include no abdominal pain, coughing, fever, headaches, increased  flatus, myalgias, URI, vomiting or weight loss. Nothing aggravates the symptoms. There are no known risk factors. She has tried nothing for the symptoms. The treatment provided no relief. There is no history of bowel resection, inflammatory bowel disease, irritable bowel syndrome, malabsorption, a recent abdominal surgery or short gut syndrome.   Also reviewed chronic medical issues and interval medical events  Past Medical History  Diagnosis Date  . BRONCHITIS, CHRONIC   . ALLERGIC RHINITIS   . Anxiety state, unspecified   . DEPRESSION   . DIABETES MELLITUS, TYPE II   . GERD   . HYPERLIPIDEMIA   . HYPERTENSION     Review of Systems  Constitutional: Negative for fever, weight loss and fatigue.  HENT: Positive for postnasal drip, rhinorrhea, sinus pressure and sneezing. Negative for ear discharge, ear pain and hearing loss.   Respiratory: Negative for cough and shortness of breath.   Cardiovascular: Negative for chest pain, palpitations and leg swelling.  Gastrointestinal: Positive for diarrhea (chronic, worse 2 weeks ago x 1 week, now back to baseline). Negative for nausea, vomiting, abdominal pain, constipation, blood in stool, anal bleeding and flatus.  Musculoskeletal: Negative for myalgias.  Neurological: Positive for dizziness (3 days ago, lasted 24h, now resolved). Negative for seizures, syncope, facial asymmetry, speech difficulty, weakness, light-headedness and headaches.       Objective:   Physical Exam  BP 138/70  Pulse 94  Temp(Src) 98.6 F (37 C)  (Oral)  Wt 168 lb 12.8 oz (76.567 kg)  SpO2 97% Wt Readings from Last 3 Encounters:  02/03/14 168 lb 12.8 oz (76.567 kg)  01/11/14 169 lb (76.658 kg)  12/31/13 170 lb 3.2 oz (77.202 kg)   Constitutional: She appears well-developed and well-nourished. No distress. Nontoxic HENT: number cephalic atraumatic. Ears with mild pleural effusion left greater than right. No erythema or cerumen impaction. Oropharynx with postnasal drip and cobblestone changes -no erythema or exudate Eyes: PERRL -normal accommodation. Extraocular motor intact. No nystagmus. No conjunctivitis or icterus Neck: Normal range of motion. Neck supple. No JVD present. No thyromegaly present.  Cardiovascular: Normal rate, regular rhythm and normal heart sounds.  No murmur heard. No BLE edema. Pulmonary/Chest: Effort normal and breath sounds normal. No respiratory distress. She has no wheezes.  Abdomen: SNTND, +BS Skin: L shin with 1cm firm nodule on anterior surface of shin -No ulceration. No erythema or abnormal warmth. No fluctuance. Patient reports pruritus associated with nodule Neurologic: awake, alert, oriented x4. Cranial nerves II through XII symmetrically intact. Gait and balance normal. Speech normal Psychiatric: She has a normal mood and affect. Her behavior is normal. Judgment and thought content normal.   Lab Results  Component Value Date   WBC 10.9* 06/10/2012   HGB 12.2 06/10/2012   HCT 38.5 06/10/2012   PLT 272.0 06/10/2012   GLUCOSE 105* 11/23/2013   CHOL 142 06/23/2013   TRIG 147.0 06/23/2013   HDL 35.60* 06/23/2013   LDLDIRECT 155.1 02/09/2013   LDLCALC 77 06/23/2013   ALT 27 06/23/2013   AST 31 06/23/2013   NA 142 11/23/2013  K 3.7 11/23/2013   CL 106 11/23/2013   CREATININE 0.9 11/23/2013   BUN 14 11/23/2013   CO2 30 11/23/2013   TSH 1.81 06/10/2012   HGBA1C 6.9* 11/23/2013   MICROALBUR 1.2 11/23/2013    No results found.     Assessment & Plan:   Diarrhea- 2 weeks ago, now resolved - back to baseline  bowels suspect self limited vGE imposed upon chronic issues with metformin side effects - see next - reassurance provided - recent colo 12/2013 reviewed (normal)- pt to call if symptoms recurrent or unimproved with change of metformin dose for further evaluation and treatment as needed  Vertigo 3 days ago. Now resolved. Limited to positional change, no symptoms present at rest. Her exam today benign. ENT exam was mildly or effusion. Suspect allergic exacerbation of underlying inner ear issues. Treat with meclizine as needed. Patient to call symptoms worse or unimproved  L shin with skin nodule. Question EN - refer to dermatology for further evaluation and treatment of same. Reassurance provided. Advised topical hydrocortisone as needed and that time  Problem List Items Addressed This Visit   DIARRHEA - Primary   Type II or unspecified type diabetes mellitus with neurological manifestations, not stated as uncontrolled(250.60)      On metformin; also ARB, ASA 81 -  Reduce metformin now given GI side effects and A1c less than 7 Started low statin 01/2013  (previosuly declined but then reconsider following spouse MI 02/2012) reports home cbgs "ok" -130s Check a1c now, adjust further if needed Lab Results  Component Value Date   HGBA1C 6.9* 11/23/2013      Relevant Medications      metFORMIN (GLUCOPHAGE) tablet   Other Relevant Orders      Hemoglobin A1c    Other Visit Diagnoses   Skin nodule        Relevant Orders       Ambulatory referral to Dermatology    BPPV (benign paroxysmal positional vertigo)

## 2014-02-15 DIAGNOSIS — D485 Neoplasm of uncertain behavior of skin: Secondary | ICD-10-CM | POA: Diagnosis not present

## 2014-02-24 ENCOUNTER — Other Ambulatory Visit: Payer: Self-pay | Admitting: Internal Medicine

## 2014-03-08 DIAGNOSIS — H9319 Tinnitus, unspecified ear: Secondary | ICD-10-CM | POA: Diagnosis not present

## 2014-03-08 DIAGNOSIS — H905 Unspecified sensorineural hearing loss: Secondary | ICD-10-CM | POA: Diagnosis not present

## 2014-03-08 DIAGNOSIS — H9209 Otalgia, unspecified ear: Secondary | ICD-10-CM | POA: Diagnosis not present

## 2014-03-23 ENCOUNTER — Other Ambulatory Visit: Payer: Self-pay | Admitting: *Deleted

## 2014-03-23 ENCOUNTER — Ambulatory Visit: Payer: Medicare Other | Admitting: Internal Medicine

## 2014-03-23 MED ORDER — METFORMIN HCL 500 MG PO TABS: 500.0000 mg | ORAL_TABLET | Freq: Every day | ORAL | Status: DC

## 2014-03-29 ENCOUNTER — Other Ambulatory Visit: Payer: Self-pay | Admitting: Internal Medicine

## 2014-03-30 ENCOUNTER — Other Ambulatory Visit: Payer: Self-pay | Admitting: *Deleted

## 2014-03-30 MED ORDER — AMLODIPINE BESYLATE 10 MG PO TABS: 10.0000 mg | ORAL_TABLET | Freq: Every morning | ORAL | Status: DC

## 2014-03-31 ENCOUNTER — Ambulatory Visit: Payer: Medicare Other | Admitting: Internal Medicine

## 2014-04-07 ENCOUNTER — Encounter: Payer: Self-pay | Admitting: Internal Medicine

## 2014-04-07 ENCOUNTER — Ambulatory Visit (INDEPENDENT_AMBULATORY_CARE_PROVIDER_SITE_OTHER): Payer: Medicare Other | Admitting: Internal Medicine

## 2014-04-07 VITALS — BP 122/62 | HR 83 | Temp 98.7°F | Ht 60.0 in | Wt 169.0 lb

## 2014-04-07 DIAGNOSIS — R05 Cough: Secondary | ICD-10-CM

## 2014-04-07 DIAGNOSIS — R058 Other specified cough: Secondary | ICD-10-CM

## 2014-04-07 DIAGNOSIS — R059 Cough, unspecified: Secondary | ICD-10-CM

## 2014-04-07 MED ORDER — GABAPENTIN 100 MG PO CAPS: 100.0000 mg | ORAL_CAPSULE | Freq: Three times a day (TID) | ORAL | Status: DC

## 2014-04-07 NOTE — Progress Notes (Signed)
Subjective:     Patient ID: Heidi Franklin, female   DOB: 1946-06-28    MRN: [id number]  Brief patient profile:   68 yobf never smoker with chronic cough  since age 68's only resolves for a week at a time then recurs daily,  dx as  asthma, post-nasal drip with nasal septal deviation, and GERD with neg MCT 11/2013    HPI   08/03/2012 f/u ov/Elenor Wildes cc cough for decades, better on present rx than for many years prior to pulmonary clinic changes, not using calendar, poor hfa  technique   but no limiting sob.  rec Change zyrtec to where you take it only if needed for itching/ sneezing  Work on inhaler technique:      Ok to use symbicort with spacer if not improving   07/29/2013 f/u ov/Corbin Falck re: chronic cough since age 68  Chief Complaint  Patient presents with  . Follow-up    Breathing is some worse since the last visit. She is needing xopenex for rescue approx 2 x per wk. She c/o hoarseness and ear pain.    >>Neurontin Three times a day  rx , change b/p meds to tribenzor  08/26/2013 Follow up and Med review  Returns for follow up and med review.  We reviewed all her meds and organized them into a med calendar with pt education. Pt did not fill neurontin rx .  Can not afford Tribenzor and wants to go back to norvasc and losartan. Cough is some better . Comes and goes. Seen by ENT says laryngoscopy was benign. No records noted. Does still have PND, HA, bilateral ear congestion/popping. Using chlortrimeton At bedtime  Advised can use more often if needed.  rec No change in rx  10/26/2013 f/u ov/Clarnce Homan re: chronic cough back on cozar Chief Complaint  Patient presents with  . Follow-up    Breathing and cough doing well.  Over weekend developed cough non-produtive  says never really completely free of cough, now worse, not compliant with symbicort.  >Stop losartan, Start diovan 160 /25 one daily  Change symbicort to 2 puffs every 12 hours As needed  Cough/wheeze    12/06/2013 Follow up and Med review   Patient returns for a followup visit and medication review. We reviewed all her medications organized them into a medication calendar with patient education. Appears the patient is taking her medications correctly. Since last visit. Patient reports that she is doing better. Cough is not as bad. Last ov Symbicort was changed to As needed  Use only.  She is using on average every other day. Not sure if she needs or not.  No fever, chest pain , edema or wheezing. No xopenex use since last ov.  rec MCT > neg  12/31/2013 f/u ov/Kanani Mowbray re: uacs Chief Complaint  Patient presents with  . Follow-up    States breathing is doing well. C/o SOB with moderate activity and dry cough. Denies CP.    only gets a tickle in throat when out in crowds, still using symbicort qod but no change on days when uses it. Not really limited from desired activities by cough or sob rec Try off singulair to see what difference if any this makes See calendar   04/07/2014 f/u ov/Bluma Buresh re: cough x decades Chief Complaint  Patient presents with  . Follow-up    Pt c/o increased cough and SOB for the past wk. Cough is non prod. She also has noticed minimal wheezing. She has been using symbicort daily  and xopenex HFA approx 2 x per wk.      No better with asthma rx   No obvious day to day or daytime variabilty or assoc  cp or chest tightness, subjective wheeze overt sinus or hb symptoms. No unusual exp hx or h/o childhood pna/ asthma or knowledge of premature birth.  Sleeping ok without nocturnal  or early am exacerbation  of respiratory  c/o's or need for noct saba. Also denies any obvious fluctuation of symptoms with weather or environmental changes or other aggravating or alleviating factors except as outlined above   Current Medications, Allergies, Complete Past Medical History, Past Surgical History, Family History, and Social History were reviewed in [employer] record.  ROS  The following are not  active complaints unless bolded sore throat, dysphagia, dental problems, itching, sneezing,  nasal congestion or excess/ purulent secretions, ear ache,   fever, chills, sweats, unintended wt loss, pleuritic or exertional cp, hemoptysis,  orthopnea pnd or leg swelling, presyncope, palpitations, heartburn, abdominal pain, anorexia, nausea, vomiting, diarrhea  or change in bowel or urinary habits, change in stools or urine, dysuria,hematuria,  rash, arthralgias, visual complaints, headache, numbness weakness or ataxia or problems with walking or coordination,  change in mood/affect or memory.                           Objective:   Physical Exam  Exam:  GEN: A/Ox3; pleasant , NAD, well nad mild hoarse amb bf nad   Wt Readings from Last 3 Encounters:  04/07/14 169 lb (76.658 kg)  02/03/14 168 lb 12.8 oz (76.567 kg)  01/11/14 169 lb (76.658 kg)      HEENT:  [place]/AT,  EACs-clear, TMs-wnl, NOSE-clear drainage  THROAT-clear, no lesions, no postnasal drip or exudate noted.   NECK:  Supple w/ fair ROM; no JVD; normal carotid impulses w/o bruits; no thyromegaly or nodules palpated; no lymphadenopathy.  RESP  Clear to A and P with no accessory muscle use, no dullness to percussion, and no cough on insp or exp  CARD:  RRR, no m/r/g  , no peripheral edema, pulses intact, no cyanosis or clubbing.  GI:   Soft & nt; nml bowel sounds; no organomegaly or masses detected.  Musco: Warm bil, no deformities or joint swelling noted.   Neuro: alert, no focal deficits noted.    Skin: Warm, no lesions or rashes     Assessment:

## 2014-04-07 NOTE — Patient Instructions (Addendum)
Add neurontin 100 mg three times daily to your maintenance medications  See calendar for specific medication instructions and bring it back for each and every office visit for every healthcare provider you see.  Without it,  you may not receive the best quality medical care that we feel you deserve.  You will note that the calendar groups together  your maintenance  medications that are timed at particular times of the day.  Think of this as your checklist for what your doctor has instructed you to do until your next evaluation to see what benefit  there is  to staying on a consistent group of medications intended to keep you well.  The other group at the bottom is entirely up to you to use as you see fit  for specific symptoms that may arise between visits that require you to treat them on an as needed basis.  Think of this as your action plan or "what if" list.   Separating the top medications from the bottom group is fundamental to providing you adequate care going forward.    Please schedule a follow up office visit in 6 weeks, call sooner if needed

## 2014-04-10 NOTE — Assessment & Plan Note (Addendum)
PFT 11/07/11>>FEV1 1.79 (102%),  Ratio 82, TLC 4.18 (104%), DLCO 69%, positive BD only in terms of fef 25/75 CT sinus 11/08/11>>Negative paranasal sinuses.  Rightward nasal septal deviation and possible rhinitis. 01/13/12>>RAST negative, IgE 22.7  - hfa75% 03/04/2013  -med calendar 11/23/2012 , 03/18/2013 , 08/26/2013 , 12/06/2013 -methacholine challenge test  12/24/13 > neg  - try off singulair 12/31/2013  -    cleary in retrospect this is  Classic Upper airway cough syndrome, so named because it's frequently impossible to sort out how much is  CR/sinusitis with freq throat clearing (which can be related to primary GERD)   vs  causing  secondary (" extra esophageal")  GERD from wide swings in gastric pressure that occur with throat clearing, often  promoting self use of mint and menthol lozenges that reduce the lower esophageal sphincter tone and exacerbate the problem further in a cyclical fashion.   These are the same pts (now being labeled as having "irritable larynx syndrome" by some cough centers) who not infrequently have a history of having failed to tolerate ace inhibitors,  dry powder inhalers or biphosphonates or report having atypical reflux symptoms that don't respond to standard doses of PPI , and are easily confused as having aecopd or asthma flares by even experienced allergists/ pulmonologists.  Add neurontin trial next

## 2014-05-10 ENCOUNTER — Encounter: Payer: Self-pay | Admitting: Internal Medicine

## 2014-05-10 ENCOUNTER — Ambulatory Visit (INDEPENDENT_AMBULATORY_CARE_PROVIDER_SITE_OTHER): Payer: Medicare Other | Admitting: Internal Medicine

## 2014-05-10 VITALS — BP 138/70 | HR 99 | Temp 98.6°F | Wt 168.8 lb

## 2014-05-10 DIAGNOSIS — M65312 Trigger thumb, left thumb: Secondary | ICD-10-CM

## 2014-05-10 DIAGNOSIS — D1779 Benign lipomatous neoplasm of other sites: Secondary | ICD-10-CM | POA: Diagnosis not present

## 2014-05-10 DIAGNOSIS — M6530 Trigger finger, unspecified finger: Secondary | ICD-10-CM

## 2014-05-10 DIAGNOSIS — D1720 Benign lipomatous neoplasm of skin and subcutaneous tissue of unspecified limb: Secondary | ICD-10-CM

## 2014-05-10 NOTE — Progress Notes (Signed)
   Subjective:    Patient ID: Heidi Franklin, female    DOB: Oct 17, 1946, 68 y.o.   MRN: [id number]  HPI  She is to issues of concern.  She noted a knot above the right ankle 05/09/14 while applying lotion to the lower leg. This is not associated with pain or discomfort. Is in the context of a history of multiple lipomata.  She also has had triggering of the left thumb which is associated with pain up to 6-8 level. This began last week.  She is a past history of having steroid injections for trigger finger of the left hand.  She's noted associated joint stiffness and pain in. She has some weakness in hands at time.    Review of Systems  She denies fever, chills, or sweats. She has no unexplained weight loss  She is a diabetic.A1c has been stable @ 6.9%.     Objective:   Physical Exam   Significant or distinguishing  findings on physical exam are documented first.  Below that are other systems examined & findings.   A grade 1/2 systolic murmur is present at the right base. There is slight triggering of the left thumb. She has minimal degenerative joint changes of her fingers. There is a 2 x 2 centimeter lipoma at the right lateral ankle area. This is movable and transilluminates.   General appearance is one of good health and nourishment w/o distress. Eyes: No conjunctival inflammation or scleral icterus is present. Oral exam: Dental hygiene is good; lips and gums are healthy appearing.There is no oropharyngeal erythema or exudate noted.  Heart:  Normal rate and regular rhythm. S1 and S2 normal without gallop, click, rub or other extra sounds   Lungs:Chest clear to auscultation; no wheezes, rhonchi,rales ,or rubs present.No increased work of breathing.  Abdomen: bowel sounds normal, soft and non-tender without masses, organomegaly or hernias noted.  No guarding or rebound .  Musculoskeletal: Able to lie flat and sit up without help. Negative straight leg raising bilaterally. Homan'  s negative.Gait normal Skin:Warm & dry.  Intact without suspicious lesions or rashes ; no jaundice or tenting Lymphatic: No lymphadenopathy is noted about the head, neck, axilla              Assessment & Plan:  #1 lipoma R ankle #2 trigger thumb See orders & AVS

## 2014-05-10 NOTE — Progress Notes (Signed)
Pre visit review using our clinic review tool, if applicable. No additional management support is needed unless otherwise documented below in the visit note. 

## 2014-05-10 NOTE — Patient Instructions (Signed)
Use an anti-inflammatory cream such as Aspercreme or Zostrix cream twice a day to the affected area as needed. In lieu of this warm moist compresses or  hot water bottle can be used. Do not apply ice .   Consider glucosamine sulfate 1500 mg daily for thumb symptoms. Take this daily  for 3 months and then leave it off for 2 months. This will rehydrate the tendons

## 2014-05-18 ENCOUNTER — Other Ambulatory Visit: Payer: Self-pay

## 2014-05-18 MED ORDER — FLUTICASONE PROPIONATE 50 MCG/ACT NA SUSP: 1.0000 | Freq: Two times a day (BID) | NASAL | Status: DC

## 2014-05-19 ENCOUNTER — Ambulatory Visit: Payer: Medicare Other | Admitting: Internal Medicine

## 2014-05-20 ENCOUNTER — Ambulatory Visit (INDEPENDENT_AMBULATORY_CARE_PROVIDER_SITE_OTHER): Payer: Medicare Other | Admitting: Internal Medicine

## 2014-05-20 ENCOUNTER — Encounter: Payer: Self-pay | Admitting: Internal Medicine

## 2014-05-20 VITALS — BP 112/60 | HR 83 | Temp 98.3°F | Ht 60.0 in | Wt 168.0 lb

## 2014-05-20 DIAGNOSIS — R05 Cough: Secondary | ICD-10-CM

## 2014-05-20 DIAGNOSIS — R059 Cough, unspecified: Secondary | ICD-10-CM

## 2014-05-20 DIAGNOSIS — R058 Other specified cough: Secondary | ICD-10-CM

## 2014-05-20 MED ORDER — GABAPENTIN 100 MG PO CAPS: ORAL_CAPSULE | ORAL | Status: DC

## 2014-05-20 NOTE — Patient Instructions (Addendum)
Stop symbicort to see whether you need a lot more xopenex and if so restart it   Start gabapentin 100 mg four times daily  Increase chlortrimeton 4 mg 2 at bedtime   See [name] NP  in 2 weeks with all your medications, even over the counter meds, separated in two separate bags, the ones you take no matter what vs the ones you stop once you feel better and take only as needed when you feel you need them.   [name]  will generate for you a new user friendly medication calendar that will put [place] all on the same page re: your medication use.     Without this process, it simply isn't possible to assure that we are providing  your outpatient care  with  the attention to detail we feel you deserve.   If we cannot assure that you're getting that kind of care,  then we cannot manage your problem effectively from this clinic.  Once you have seen [name] and we are sure that we're all on the same page with your medication use she will arrange follow up with me.

## 2014-05-20 NOTE — Progress Notes (Signed)
Subjective:     Patient ID: Amijah Timothy, female   DOB: 1946/07/13    MRN: [id number]  Brief patient profile:   61 yobf never smoker with chronic cough  since age 68's only resolves for a week at a time then recurs daily,  dx as  asthma, post-nasal drip with nasal septal deviation, and GERD with neg MCT 11/2013    HPI   08/03/2012 f/u ov/Makaveli Hoard cc cough for decades, better on present rx than for many years prior to pulmonary clinic changes, not using calendar, poor hfa  technique   but no limiting sob.  rec Change zyrtec to where you take it only if needed for itching/ sneezing  Work on inhaler technique:      Ok to use symbicort with spacer if not improving   07/29/2013 f/u ov/Yair Dusza re: chronic cough since age 68  Chief Complaint  Patient presents with  . Follow-up    Breathing is some worse since the last visit. She is needing xopenex for rescue approx 2 x per wk. She c/o hoarseness and ear pain.    >>Neurontin Three times a day  rx , change b/p meds to tribenzor  08/26/2013 Follow up and Med review  Returns for follow up and med review.  We reviewed all her meds and organized them into a med calendar with pt education. Pt did not fill neurontin rx .  Can not afford Tribenzor and wants to go back to norvasc and losartan. Cough is some better . Comes and goes. Seen by ENT says laryngoscopy was benign. No records noted. Does still have PND, HA, bilateral ear congestion/popping. Using chlortrimeton At bedtime  Advised can use more often if needed.  rec No change in rx  10/26/2013 f/u ov/Bela Nyborg re: chronic cough back on cozar Chief Complaint  Patient presents with  . Follow-up    Breathing and cough doing well.  Over weekend developed cough non-produtive  says never really completely free of cough, now worse, not compliant with symbicort.  >Stop losartan, Start diovan 160 /25 one daily  Change symbicort to 2 puffs every 12 hours As needed  Cough/wheeze    12/06/2013 Follow up and Med review   Patient returns for a followup visit and medication review. We reviewed all her medications organized them into a medication calendar with patient education. Appears the patient is taking her medications correctly. Since last visit. Patient reports that she is doing better. Cough is not as bad. Last ov Symbicort was changed to As needed  Use only.  She is using on average every other day. Not sure if she needs or not.  No fever, chest pain , edema or wheezing. No xopenex use since last ov.  rec MCT > neg  12/31/2013 f/u ov/Rosell Khouri re: uacs Chief Complaint  Patient presents with  . Follow-up    States breathing is doing well. C/o SOB with moderate activity and dry cough. Denies CP.    only gets a tickle in throat when out in crowds, still using symbicort qod but no change on days when uses it. Not really limited from desired activities by cough or sob rec Try off singulair to see what difference if any this makes See calendar   04/07/2014 f/u ov/Avelynn Sellin re: cough x decades Chief Complaint  Patient presents with  . Follow-up    Pt c/o increased cough and SOB for the past wk. Cough is non prod. She also has noticed minimal wheezing. She has been using symbicort daily  and xopenex HFA approx 2 x per wk.   No better with asthma rx  rec Add neurontin 100 tid    05/20/2014 f/u ov/Kaelynne Christley re: brought calendar and not using much symbicort / confusing maint vs prns Chief Complaint  Patient presents with  . Follow-up    Cough has improved since her last visit. Still c/o "tickle" in throat causing her to cough. Cough is non prod. She is using rescue inhaler once per wk on average.   never started neurontin -  Not limited by breathing from desired activities      No obvious day to day or daytime variabilty or assoc  cp or chest tightness, subjective wheeze overt sinus or hb symptoms. No unusual exp hx or h/o childhood pna/ asthma or knowledge of premature birth.  Sleeping ok without nocturnal  or early  am exacerbation  of respiratory  c/o's or need for noct saba. Also denies any obvious fluctuation of symptoms with weather or environmental changes or other aggravating or alleviating factors except as outlined above   Current Medications, Allergies, Complete Past Medical History, Past Surgical History, Family History, and Social History were reviewed in [employer] record.  ROS  The following are not active complaints unless bolded sore throat, dysphagia, dental problems, itching, sneezing,  nasal congestion or excess/ purulent secretions, ear ache,   fever, chills, sweats, unintended wt loss, pleuritic or exertional cp, hemoptysis,  orthopnea pnd or leg swelling, presyncope, palpitations, heartburn, abdominal pain, anorexia, nausea, vomiting, diarrhea  or change in bowel or urinary habits, change in stools or urine, dysuria,hematuria,  rash, arthralgias, visual complaints, headache, numbness weakness or ataxia or problems with walking or coordination,  change in mood/affect or memory.                           Objective:   Physical Exam  Exam:  GEN: A/Ox3; pleasant , NAD, well nad mild hoarse amb bf nad   05/20/2014       168  Wt Readings from Last 3 Encounters:  04/07/14 169 lb (76.658 kg)  02/03/14 168 lb 12.8 oz (76.567 kg)  01/11/14 169 lb (76.658 kg)      HEENT:  /AT,  EACs-clear, TMs-wnl, NOSE-clear drainage  THROAT-clear, no lesions, no postnasal drip or exudate noted.   NECK:  Supple w/ fair ROM; no JVD; normal carotid impulses w/o bruits; no thyromegaly or nodules palpated; no lymphadenopathy.  RESP  Clear to A and P with no accessory muscle use, no dullness to percussion, and no cough on insp or exp  CARD:  RRR, no m/r/g  , no peripheral edema, pulses intact, no cyanosis or clubbing.  GI:   Soft & nt; nml bowel sounds; no organomegaly or masses detected.  Musco: Warm bil, no deformities or joint swelling noted.   Neuro: alert, no focal  deficits noted.    Skin: Warm, no lesions or rashes     Assessment:

## 2014-05-21 NOTE — Assessment & Plan Note (Signed)
CT sinus 11/08/11>>Negative paranasal sinuses.  Rightward nasal septal deviation and possible rhinitis. 01/13/12>>RAST negative, IgE 22.7 PFT 11/07/11>>FEV1 1.79 (102%),  Ratio 82, TLC 4.18 (104%), DLCO 69%, positive BD only in terms of fef 25/75  - hfa75% 03/04/2013  -med calendar 11/23/2012 , 03/18/2013 , 08/26/2013 , 12/06/2013 -methacholine challenge test  12/24/13 > neg  - try off singulair 12/31/2013 > no worse  - added neurontin 100 mg qid last ov but didn't take it >   The standardized cough guidelines published in Chest by Lissa Morales in 2006 are still the best available and consist of a multiple step process (up to 12!) , not a single office visit,  and are intended  to address this problem logically,  with an alogrithm dependent on response to empiric treatment at  each progressive step  to determine a specific diagnosis with  minimal addtional testing needed. Therefore if adherence is an issue or can't be accurately verified,  it's very unlikely the standard evaluation and treatment will be successful here.    Furthermore, response to therapy (other than acute cough suppression, which should only be used short term with avoidance of narcotic containing cough syrups if possible), can be a gradual process for which the patient may perceive immediate benefit.  Unlike going to an eye doctor where the best perscription is almost always the first one and is immediately effective, this is almost never the case in the management of chronic cough syndromes. Therefore the patient needs to commit up front to consistently adhere to recommendations  for up to 6 weeks of therapy directed at the likely underlying problem(s) before the response can be reasonably evaluated.   First step is try completely off symbicort as not really using approp anyway and see if prn saba use increases while on neurontin 100 qid trial  Return in 2 week for a trust but verify visit with all meds / new calendar needed    Each  maintenance medication was reviewed in detail including most importantly the difference between maintenance and as needed and under what circumstances the prns are to be used. This was done in the context of a medication calendar review which provided the patient with a user-friendly unambiguous mechanism for medication administration and reconciliation and provides an action plan for all active problems. It is critical that this be shown to every doctor  for modification during the office visit if necessary so the patient can use it as a working document.

## 2014-05-23 ENCOUNTER — Other Ambulatory Visit: Payer: Self-pay

## 2014-05-23 DIAGNOSIS — M6530 Trigger finger, unspecified finger: Secondary | ICD-10-CM | POA: Diagnosis not present

## 2014-05-23 MED ORDER — FLUTICASONE PROPIONATE 50 MCG/ACT NA SUSP: 1.0000 | Freq: Two times a day (BID) | NASAL | Status: DC

## 2014-05-23 NOTE — Telephone Encounter (Signed)
Rx for Fluticsone refilled

## 2014-05-24 ENCOUNTER — Telehealth: Payer: Self-pay | Admitting: Internal Medicine

## 2014-05-24 NOTE — Telephone Encounter (Signed)
Try taking it with meals only but if problem persists then stop

## 2014-05-24 NOTE — Telephone Encounter (Signed)
Called made pt aware of recs. She voiced her understanding and needed nothing further 

## 2014-05-24 NOTE — Telephone Encounter (Signed)
Per OV 05/20/14: Start gabapentin 100 mg four times daily ---  Called spoke with pt. She reports every since she started taking the gabapentin she started coughing terribly. Also c/o PND at night as well. Please advise MW thanks  Allergies  Allergen Reactions  . Penicillins Rash

## 2014-06-08 ENCOUNTER — Other Ambulatory Visit: Payer: Self-pay

## 2014-06-08 MED ORDER — OMEPRAZOLE 40 MG PO CPDR: DELAYED_RELEASE_CAPSULE | ORAL | Status: DC

## 2014-06-09 ENCOUNTER — Encounter: Payer: Medicare Other | Admitting: Adult Health

## 2014-06-13 ENCOUNTER — Other Ambulatory Visit: Payer: Self-pay

## 2014-06-13 ENCOUNTER — Encounter: Payer: Self-pay | Admitting: Podiatry

## 2014-06-13 ENCOUNTER — Ambulatory Visit (INDEPENDENT_AMBULATORY_CARE_PROVIDER_SITE_OTHER): Payer: Medicare Other | Admitting: Podiatry

## 2014-06-13 VITALS — BP 132/70 | HR 85 | Resp 18

## 2014-06-13 DIAGNOSIS — Q828 Other specified congenital malformations of skin: Secondary | ICD-10-CM | POA: Diagnosis not present

## 2014-06-13 DIAGNOSIS — E119 Type 2 diabetes mellitus without complications: Secondary | ICD-10-CM

## 2014-06-13 MED ORDER — OMEPRAZOLE 40 MG PO CPDR: DELAYED_RELEASE_CAPSULE | ORAL | Status: DC

## 2014-06-13 NOTE — Progress Notes (Signed)
   Subjective:    Patient ID: Heidi Franklin, female    DOB: 1946-08-15, 68 y.o.   MRN: [id number]  HPI I AM A DIABETIC AND HAVE BEEN FOR ABOUT 6 TO 7 YEARS AND NEED TO HAVE MY FEET CHECKED AN D I HAVE BEEN HERE BEFORE  This patient was last seen in our office on 06/02/2013  Review of Systems  HENT: Positive for sinus pressure.        RINGING IN EARS  Respiratory: Positive for cough and wheezing.   Gastrointestinal: Positive for diarrhea.  Musculoskeletal:       JOINT PAIN  Psychiatric/Behavioral: The patient is nervous/anxious.   All other systems reviewed and are negative.      Objective:   Physical Exam  Orientated x3 black female  Vascular: DP and PT pulses 2/4 bilaterally  Neurological: Ankle reflex equal and reactive bilaterally Vibratory sensation intact bilaterally Sensation to 10 g monofilament wire intact 5/5 bilaterally  Dermatological: Slight keratoses fifth left MPJ Texture and turgor within normal limits  Musculoskeletal: HAV deformities bilaterally Pes planus bilaterally No restriction ankle, subtalar, midtarsal joints bilaterally      Assessment & Plan:   Assessment: Satisfactory neurovascular status Diabetic without complications HAV deformity bilaterally  Plan: Patient advised she has satisfactory vascular and neurological status. Information about diabetic foot care provided  Reappoint at yearly intervals or sooner if the patient is concerned

## 2014-06-13 NOTE — Patient Instructions (Signed)
Diabetes and Foot Care Diabetes may cause you to have problems because of poor blood supply (circulation) to your feet and legs. This may cause the skin on your feet to become thinner, break easier, and heal more slowly. Your skin may become dry, and the skin may peel and crack. You may also have nerve damage in your legs and feet causing decreased feeling in them. You may not notice minor injuries to your feet that could lead to infections or more serious problems. Taking care of your feet is one of the most important things you can do for yourself.  HOME CARE INSTRUCTIONS  Wear shoes at all times, even in the house. Do not go barefoot. Bare feet are easily injured.  Check your feet daily for blisters, cuts, and redness. If you cannot see the bottom of your feet, use a mirror or ask someone for help.  Wash your feet with warm water (do not use hot water) and mild soap. Then pat your feet and the areas between your toes until they are completely dry. Do not soak your feet as this can dry your skin.  Apply a moisturizing lotion or petroleum jelly (that does not contain alcohol and is unscented) to the skin on your feet and to dry, brittle toenails. Do not apply lotion between your toes.  Trim your toenails straight across. Do not dig under them or around the cuticle. File the edges of your nails with an emery board or nail file.  Do not cut corns or calluses or try to remove them with medicine.  Wear clean socks or stockings every day. Make sure they are not too tight. Do not wear knee-high stockings since they may decrease blood flow to your legs.  Wear shoes that fit properly and have enough cushioning. To break in new shoes, wear them for just a few hours a day. This prevents you from injuring your feet. Always look in your shoes before you put them on to be sure there are no objects inside.  Do not cross your legs. This may decrease the blood flow to your feet.  If you find a minor scrape,  cut, or break in the skin on your feet, keep it and the skin around it clean and dry. These areas may be cleansed with mild soap and water. Do not cleanse the area with peroxide, alcohol, or iodine.  When you remove an adhesive bandage, be sure not to damage the skin around it.  If you have a wound, look at it several times a day to make sure it is healing.  Do not use heating pads or hot water bottles. They may burn your skin. If you have lost feeling in your feet or legs, you may not know it is happening until it is too late.  Make sure your health care provider performs a complete foot exam at least annually or more often if you have foot problems. Report any cuts, sores, or bruises to your health care provider immediately. SEEK MEDICAL CARE IF:   You have an injury that is not healing.  You have cuts or breaks in the skin.  You have an ingrown nail.  You notice redness on your legs or feet.  You feel burning or tingling in your legs or feet.  You have pain or cramps in your legs and feet.  Your legs or feet are numb.  Your feet always feel cold. SEEK IMMEDIATE MEDICAL CARE IF:   There is increasing redness,   swelling, or pain in or around a wound.  There is a red line that goes up your leg.  Pus is coming from a wound.  You develop a fever or as directed by your health care provider.  You notice a bad smell coming from an ulcer or wound. Document Released: 10/11/2000 Document Revised: 06/16/2013 Document Reviewed: 03/23/2013 ExitCare Patient Information 2015 [employer]. This information is not intended to replace advice given to you by your health care provider. Make sure you discuss any questions you have with your health care provider.  

## 2014-06-13 NOTE — Telephone Encounter (Signed)
erx sent to [address] [facility]

## 2014-06-21 ENCOUNTER — Ambulatory Visit (INDEPENDENT_AMBULATORY_CARE_PROVIDER_SITE_OTHER): Payer: Medicare Other | Admitting: Adult Health

## 2014-06-21 ENCOUNTER — Encounter: Payer: Self-pay | Admitting: Adult Health

## 2014-06-21 VITALS — BP 124/74 | HR 89 | Temp 98.9°F | Ht 60.0 in | Wt 168.0 lb

## 2014-06-21 DIAGNOSIS — R05 Cough: Secondary | ICD-10-CM | POA: Diagnosis not present

## 2014-06-21 DIAGNOSIS — R059 Cough, unspecified: Secondary | ICD-10-CM

## 2014-06-21 DIAGNOSIS — R058 Other specified cough: Secondary | ICD-10-CM

## 2014-06-21 MED ORDER — HYDROCODONE-HOMATROPINE 5-1.5 MG/5ML PO SYRP: ORAL_SOLUTION | ORAL | Status: DC

## 2014-06-21 MED ORDER — BUDESONIDE-FORMOTEROL FUMARATE 80-4.5 MCG/ACT IN AERO: 2.0000 | INHALATION_SPRAY | Freq: Two times a day (BID) | RESPIRATORY_TRACT | Status: DC | PRN

## 2014-06-21 NOTE — Addendum Note (Signed)
Addended by: Parke Poisson E on: 06/21/2014 03:14 PM   Modules accepted: Orders

## 2014-06-21 NOTE — Assessment & Plan Note (Signed)
Cyclical cough with extensive workup -neg for asthma  Cont w/ cough control with trigger prevention   Plan  Remember to use your Delsym for cough As needed   Remember to use Chlorphenaramine for drainage and throat clearing As needed   Follow med calendar closely and bring to each visit.  Follow up Dr. Melvyn Novas  In 2 months and As needed

## 2014-06-21 NOTE — Progress Notes (Signed)
Subjective:     Patient ID: Heidi Franklin, female   DOB: May 27, 1946    MRN: [id number]  Brief patient profile:   68 yobf never smoker with chronic cough  since age 68's only resolves for a week at a time then recurs daily,  dx as  asthma, post-nasal drip with nasal septal deviation, and GERD with neg MCT 11/2013    HPI   08/03/2012 f/u ov/Wert cc cough for decades, better on present rx than for many years prior to pulmonary clinic changes, not using calendar, poor hfa  technique   but no limiting sob.  rec Change zyrtec to where you take it only if needed for itching/ sneezing  Work on inhaler technique:      Ok to use symbicort with spacer if not improving   07/29/2013 f/u ov/Wert re: chronic cough since age 68  Chief Complaint  Patient presents with  . Follow-up    Breathing is some worse since the last visit. She is needing xopenex for rescue approx 2 x per wk. She c/o hoarseness and ear pain.    >>Neurontin Three times a day  rx , change b/p meds to tribenzor  08/26/2013 Follow up and Med review  Returns for follow up and med review.  We reviewed all her meds and organized them into a med calendar with pt education. Pt did not fill neurontin rx .  Can not afford Tribenzor and wants to go back to norvasc and losartan. Cough is some better . Comes and goes. Seen by ENT says laryngoscopy was benign. No records noted. Does still have PND, HA, bilateral ear congestion/popping. Using chlortrimeton At bedtime  Advised can use more often if needed.  rec No change in rx  10/26/2013 f/u ov/Wert re: chronic cough back on cozar Chief Complaint  Patient presents with  . Follow-up    Breathing and cough doing well.  Over weekend developed cough non-produtive  says never really completely free of cough, now worse, not compliant with symbicort.  >Stop losartan, Start diovan 160 /25 one daily  Change symbicort to 2 puffs every 12 hours As needed  Cough/wheeze    12/06/2013 Follow up and Med review   Patient returns for a followup visit and medication review. We reviewed all her medications organized them into a medication calendar with patient education. Appears the patient is taking her medications correctly. Since last visit. Patient reports that she is doing better. Cough is not as bad. Last ov Symbicort was changed to As needed  Use only.  She is using on average every other day. Not sure if she needs or not.  No fever, chest pain , edema or wheezing. No xopenex use since last ov.  rec MCT > neg  12/31/2013 f/u ov/Wert re: uacs Chief Complaint  Patient presents with  . Follow-up    States breathing is doing well. C/o SOB with moderate activity and dry cough. Denies CP.    only gets a tickle in throat when out in crowds, still using symbicort qod but no change on days when uses it. Not really limited from desired activities by cough or sob rec Try off singulair to see what difference if any this makes See calendar   04/07/2014 f/u ov/Wert re: cough x decades Chief Complaint  Patient presents with  . Follow-up    Pt c/o increased cough and SOB for the past wk. Cough is non prod. She also has noticed minimal wheezing. She has been using symbicort daily  and xopenex HFA approx 2 x per wk.   No better with asthma rx  rec Add neurontin 100 tid    05/20/2014 f/u ov/Wert re: brought calendar and not using much symbicort / confusing maint vs prns Chief Complaint  Patient presents with  . Follow-up    Cough has improved since her last visit. Still c/o "tickle" in throat causing her to cough. Cough is non prod. She is using rescue inhaler once per wk on average.   never started neurontin -  Not limited by breathing from desired activities     >>Stop symbicort to see whether you need a lot more xopenex and if so restart it  Start gabapentin 100 mg four times daily Increase chlortrimeton 4 mg 2 at bedtime    06/21/2014 Follow up and Med review  Patient returns for a followup visit  and medication review. We reviewed all her medications organized them into a medication calendar with patient education. Appears the patient is not using her as needed meds for cough and drainage Did not start Gabapentin yet, wanted to check to make sure she was suppose to start. -"wanted to know what it was for"  We discussed the use for gabapentin for chronic cough along with side effects.  Says her cough is about the same, comes and goes with post nasal drainage.  She denies any chest pain, orthopnea, PND, leg swelling, or hemoptysis.      Current Medications, Allergies, Complete Past Medical History, Past Surgical History, Family History, and Social History were reviewed in [employer] record.  ROS  The following are not active complaints unless bolded sore throat, dysphagia, dental problems, itching, sneezing,  nasal congestion or excess/ purulent secretions, ear ache,   fever, chills, sweats, unintended wt loss, pleuritic or exertional cp, hemoptysis,  orthopnea pnd or leg swelling, presyncope, palpitations, heartburn, abdominal pain, anorexia, nausea, vomiting, diarrhea  or change in bowel or urinary habits, change in stools or urine, dysuria,hematuria,  rash, arthralgias, visual complaints, headache, numbness weakness or ataxia or problems with walking or coordination,  change in mood/affect or memory.                           Objective:   Physical Exam  Exam:  GEN: A/Ox3; pleasant , NAD, well nad mild hoarse amb bf nad   05/20/2014       168   >168 06/21/2014   HEENT:  [place]/AT,  EACs-clear, TMs-wnl, NOSE-clear drainage  THROAT-clear, no lesions, no postnasal drip or exudate noted.   NECK:  Supple w/ fair ROM; no JVD; normal carotid impulses w/o bruits; no thyromegaly or nodules palpated; no lymphadenopathy.  RESP  Clear to A and P with no accessory muscle use, no dullness to percussion, and no cough on insp or exp  CARD:  RRR, no m/r/g  , no  peripheral edema, pulses intact, no cyanosis or clubbing.  GI:   Soft & nt; nml bowel sounds; no organomegaly or masses detected.  Musco: Warm bil, no deformities or joint swelling noted.   Neuro: alert, no focal deficits noted.    Skin: Warm, no lesions or rashes     Assessment:

## 2014-06-21 NOTE — Patient Instructions (Signed)
Remember to use your Delsym for cough As needed   Remember to use Chlorphenaramine for drainage and throat clearing As needed   Follow med calendar closely and bring to each visit.  Follow up Dr. Melvyn Novas  In 2 months and As needed

## 2014-06-27 DIAGNOSIS — Z23 Encounter for immunization: Secondary | ICD-10-CM | POA: Diagnosis not present

## 2014-06-28 ENCOUNTER — Other Ambulatory Visit: Payer: Self-pay

## 2014-06-28 ENCOUNTER — Telehealth: Payer: Self-pay | Admitting: Internal Medicine

## 2014-06-28 MED ORDER — LEVALBUTEROL TARTRATE 45 MCG/ACT IN AERO: 2.0000 | INHALATION_SPRAY | RESPIRATORY_TRACT | Status: DC | PRN

## 2014-06-28 MED ORDER — POTASSIUM CHLORIDE CRYS ER 20 MEQ PO TBCR: EXTENDED_RELEASE_TABLET | ORAL | Status: DC

## 2014-06-28 NOTE — Telephone Encounter (Signed)
Refill sent. Pt aware.Jennifer Castillo, CMA  

## 2014-06-29 ENCOUNTER — Telehealth: Payer: Self-pay | Admitting: *Deleted

## 2014-06-29 MED ORDER — ALBUTEROL SULFATE HFA 108 (90 BASE) MCG/ACT IN AERS: 2.0000 | INHALATION_SPRAY | Freq: Four times a day (QID) | RESPIRATORY_TRACT | Status: DC | PRN

## 2014-06-29 NOTE — Telephone Encounter (Signed)
Called spoke with pt. Aware of recs. RX called in. Nothing further needed

## 2014-06-29 NOTE — Telephone Encounter (Signed)
Ok to change to [employer] as there are not generics

## 2014-06-29 NOTE — Telephone Encounter (Signed)
Received fax from pharmacy. xopenex is no longer covered. Please advise MW thanks

## 2014-06-30 ENCOUNTER — Encounter: Payer: Self-pay | Admitting: Internal Medicine

## 2014-06-30 ENCOUNTER — Other Ambulatory Visit: Payer: Self-pay

## 2014-06-30 ENCOUNTER — Telehealth: Payer: Self-pay | Admitting: Internal Medicine

## 2014-06-30 ENCOUNTER — Ambulatory Visit (INDEPENDENT_AMBULATORY_CARE_PROVIDER_SITE_OTHER): Payer: Medicare Other | Admitting: Internal Medicine

## 2014-06-30 VITALS — BP 104/60 | HR 90 | Temp 98.3°F | Ht 60.0 in | Wt 168.0 lb

## 2014-06-30 DIAGNOSIS — R059 Cough, unspecified: Secondary | ICD-10-CM | POA: Diagnosis not present

## 2014-06-30 DIAGNOSIS — R05 Cough: Secondary | ICD-10-CM

## 2014-06-30 DIAGNOSIS — R058 Other specified cough: Secondary | ICD-10-CM

## 2014-06-30 MED ORDER — AZITHROMYCIN 250 MG PO TABS: ORAL_TABLET | ORAL | Status: DC

## 2014-06-30 MED ORDER — POTASSIUM CHLORIDE CRYS ER 20 MEQ PO TBCR: EXTENDED_RELEASE_TABLET | ORAL | Status: DC

## 2014-06-30 MED ORDER — TRAMADOL HCL 50 MG PO TABS: ORAL_TABLET | ORAL | Status: DC

## 2014-06-30 MED ORDER — PREDNISONE 10 MG PO TABS: ORAL_TABLET | ORAL | Status: DC

## 2014-06-30 NOTE — Telephone Encounter (Signed)
Pt states she received flu shot 2 days ago and started with deep cough yesterday with head congestion and some runny nose.  Has tried Delsym, Hydromet  And not helping.  Given appt with MW today at 2:15

## 2014-06-30 NOTE — Progress Notes (Signed)
Subjective:     Patient ID: Heidi Franklin, female   DOB: 11-05-1945    MRN: [id number]  Brief patient profile:  4 yobf never smoker with chronic cough  since age 68's only resolves for a week at a time then recurs daily,  dx as  asthma, post-nasal drip with nasal septal deviation, and GERD with neg MCT 11/2013       History of Present Illness  08/03/2012 f/u ov/Heidi Franklin cc cough for decades, better on present rx than for many years prior to pulmonary clinic changes, not using calendar, poor hfa  Technique but no limiting sob.  rec Change zyrtec to where you take it only if needed for itching/ sneezing  Work on inhaler technique:      Ok to use symbicort with spacer if not improving   07/29/2013 f/u ov/Heidi Franklin re: chronic cough since age 60  Chief Complaint  Patient presents with  . Follow-up    Breathing is some worse since the last visit. She is needing xopenex for rescue approx 2 x per wk. She c/o hoarseness and ear pain.    >>Neurontin Three times a day  rx , change b/p meds to tribenzor  08/26/2013 Follow up and Med review  Returns for follow up and med review.  We reviewed all her meds and organized them into a med calendar with pt education. Pt did not fill neurontin rx .  Can not afford Tribenzor and wants to go back to norvasc and losartan. Cough is some better . Comes and goes. Seen by ENT says laryngoscopy was benign. No records noted. Does still have PND, HA, bilateral ear congestion/popping. Using chlortrimeton At bedtime  Advised can use more often if needed.  rec No change in rx  10/26/2013 f/u ov/Heidi Franklin re: chronic cough back on cozar Chief Complaint  Patient presents with  . Follow-up    Breathing and cough doing well.  Over weekend developed cough non-produtive  says never really completely free of cough, now worse, not compliant with symbicort.  >Stop losartan, Start diovan 160 /25 one daily  Change symbicort to 2 puffs every 12 hours As needed  Cough/wheeze    12/06/2013  Follow up and Med review  Patient returns for a followup visit and medication review. We reviewed all her medications organized them into a medication calendar with patient education. Appears the patient is taking her medications correctly. Since last visit. Patient reports that she is doing better. Cough is not as bad. Last ov Symbicort was changed to As needed  Use only.  She is using on average every other day. Not sure if she needs or not.  No fever, chest pain , edema or wheezing. No xopenex use since last ov.  rec MCT > neg  12/31/2013 f/u ov/Heidi Franklin re: uacs Chief Complaint  Patient presents with  . Follow-up    States breathing is doing well. C/o SOB with moderate activity and dry cough. Denies CP.    only gets a tickle in throat when out in crowds, still using symbicort qod but no change on days when uses it. Not really limited from desired activities by cough or sob rec Try off singulair to see what difference if any this makes See calendar   04/07/2014 f/u ov/Heidi Franklin re: cough x decades Chief Complaint  Patient presents with  . Follow-up    Pt c/o increased cough and SOB for the past wk. Cough is non prod. She also has noticed minimal wheezing. She has been using  symbicort daily and xopenex HFA approx 2 x per wk.   No better with asthma rx  rec Add neurontin 100 tid > did not start   05/20/2014 f/u ov/Heidi Franklin re: brought calendar and not using much symbicort / confusing maint vs prns Chief Complaint  Patient presents with  . Follow-up    Cough has improved since her last visit. Still c/o "tickle" in throat causing her to cough. Cough is non prod. She is using rescue inhaler once per wk on average.   never started neurontin -  Not limited by breathing from desired activities     >>Stop symbicort to see whether you need a lot more xopenex and if so restart it  Start gabapentin 100 mg four times daily Increase chlortrimeton 4 mg 2 at bedtime    06/21/2014 Follow up and Med review   Patient returns for a followup visit and medication review. We reviewed all her medications organized them into a medication calendar with patient education. Appears the patient is not using her as needed meds for cough and drainage Did not start Gabapentin yet, wanted to check to make sure she was suppose to start. -"wanted to know what it was for"  We discussed the use for gabapentin for chronic cough along with side effects.  Says her cough is about the same, comes and goes with post nasal drainage.  rec Remember to use your Delsym for cough As needed   Remember to use Chlorphenaramine for drainage and throat clearing As needed     06/30/2014 f/u ov/Heidi Franklin re:  Uacs/ neg MCT / no med calendar  Chief Complaint  Patient presents with  . Acute Visit    Pt c/o cough for the past 2 days. Cough is mainly non prod, but did have very minimal hemoptysis this am.   cough worse at hs, not clear she's really following med calendar at all. Admits never in her life cough free more than a few days. Mostly sob when coughing   No obvious day to day or daytime variabilty or assoc  cp or chest tightness, subjective wheeze overt sinus or hb symptoms. No unusual exp hx or h/o childhood pna/ asthma or knowledge of premature birth.   Also denies any obvious fluctuation of symptoms with weather or environmental changes or other aggravating or alleviating factors except as outlined above   Current Medications, Allergies, Complete Past Medical History, Past Surgical History, Family History, and Social History were reviewed in [employer] record.  ROS  The following are not active complaints unless bolded sore throat, dysphagia, dental problems, itching, sneezing,  nasal congestion or excess/ purulent secretions, ear ache,   fever, chills, sweats, unintended wt loss, pleuritic or exertional cp, hemoptysis,  orthopnea pnd or leg swelling, presyncope, palpitations, heartburn, abdominal pain,  anorexia, nausea, vomiting, diarrhea  or change in bowel or urinary habits, change in stools or urine, dysuria,hematuria,  rash, arthralgias, visual complaints, headache, numbness weakness or ataxia or problems with walking or coordination,  change in mood/affect or memory.            Objective:   Physical Exam  Exam:  GEN: A/Ox3; pleasant , NAD, well nad mild hoarse amb bf nad harsh barking cough  05/20/2014       168   >168 06/21/2014 >  06/30/2014  168   HEENT:  [place]/AT,  EACs-clear, TMs-wnl, NOSE-clear drainage  THROAT-clear, no lesions, no postnasal drip or exudate noted.   NECK:  Supple w/ fair  ROM; no JVD; normal carotid impulses w/o bruits; no thyromegaly or nodules palpated; no lymphadenopathy.  RESP  Clear to A and P with no accessory muscle use, no dullness to percussion, prominent pseudowheeze  CARD:  RRR, no m/r/g  , no peripheral edema, pulses intact, no cyanosis or clubbing.  GI:   Soft & nt; nml bowel sounds; no organomegaly or masses detected.  Musco: Warm bil, no deformities or joint swelling noted.   Neuro: alert, no focal deficits noted.    Skin: Warm, no lesions or rashes   CXR  06/30/2014 :  did not go as requested     Assessment:

## 2014-06-30 NOTE — Patient Instructions (Addendum)
Change neurontin to 100 mg four times a day Take the pm pepcid and 2 chlortrimeton at least an hour or two before bedtime For cough >>Take delsym two tsp every 12 hours and supplement if needed with  tramadol 50 mg up to 2 every 4 hours to suppress the urge to cough. Swallowing water or using ice chips/non mint and menthol containing candies (such as lifesavers or sugarless jolly ranchers) are also effective.  You should rest your voice and avoid activities that you know make you cough.  Once you have eliminated the cough for 3 straight days try reducing the tramadol first,  then the delsym as tolerated.    Prednisone 10 mg take  4 each am x 2 days,   2 each am x 2 days,  1 each am x 2 days and stop zpak  See calendar for specific medication instructions and bring it back for each and every office visit for every healthcare provider you see.  Without it,  you may not receive the best quality medical care that we feel you deserve.  You will note that the calendar groups together  your maintenance  medications that are timed at particular times of the day.  Think of this as your checklist for what your doctor has instructed you to do until your next evaluation to see what benefit  there is  to staying on a consistent group of medications intended to keep you well.  The other group at the bottom is entirely up to you to use as you see fit  for specific symptoms that may arise between visits that require you to treat them on an as needed basis.  Think of this as your action plan or "what if" list.   Separating the top medications from the bottom group is fundamental to providing you adequate care going forward.    Please schedule a follow up visit in 3 months but call sooner if needed with all medications in hand

## 2014-06-30 NOTE — Assessment & Plan Note (Addendum)
CT sinus 11/08/11>>Negative paranasal sinuses.  Rightward nasal septal deviation and possible rhinitis. 01/13/12>>RAST negative, IgE 22.7 PFT 11/07/11>>FEV1 1.79 (102%),  Ratio 82, TLC 4.18 (104%), DLCO 69%, positive BD only in terms of fef 25/75  - hfa75% 03/04/2013  -med calendar 11/23/2012 , 03/18/2013 , 08/26/2013 , 12/06/2013, 06/21/2014  -methacholine challenge test  12/24/13 > neg  - try off singulair 12/31/2013 > no worse  - added neurontin 100 mg qid  05/20/14 but did not take consistently as of 06/30/2014   I had an extended discussion with the patient today lasting 15 to 20 minutes of a 25 minute visit on the following issues:  The standardized cough guidelines published in Chest by Lissa Morales in 2006 are still the best available and consist of a multiple step process (up to 12!) , not a single office visit,  and are intended  to address this problem logically,  with an alogrithm dependent on response to empiric treatment at  each progressive step  to determine a specific diagnosis with  minimal addtional testing needed. Therefore if adherence is an issue or can't be accurately verified,  it's very unlikely the standard evaluation and treatment will be successful here.    Furthermore, response to therapy (other than acute cough suppression, which should only be used short term with avoidance of narcotic containing cough syrups if possible), can be a gradual process for which the patient may perceive immediate benefit.  Unlike going to an eye doctor where the best perscription is almost always the first one and is immediately effective, this is almost never the case in the management of chronic cough syndromes. Therefore the patient needs to commit up front to consistently adhere to recommendations  for up to 6 weeks of therapy directed at the likely underlying problem(s) before the response can be reasonably evaluated.   For now rec Increase neurontin to 100 qid and on return push to target of 300  qid if needed Eliminate cyclical cough with delsym/tramadol short term only Stop symbicort as may be contributing to uacs  See instructions for specific recommendations which were reviewed directly with the patient who was given a copy with highlighter outlining the key components and the patient was given a brand new med calendar with all changes made with red pen

## 2014-07-05 NOTE — Addendum Note (Signed)
Addended by: Parke Poisson E on: 07/05/2014 10:32 AM   Modules accepted: Orders

## 2014-07-21 ENCOUNTER — Telehealth: Payer: Self-pay | Admitting: Internal Medicine

## 2014-07-21 NOTE — Telephone Encounter (Signed)
Appt has been made.

## 2014-07-21 NOTE — Telephone Encounter (Signed)
OV with PC appropriate 1st step to eval same Ok for Dr Linus Orn, Jenny Reichmann or any of my partners if unable to schedule with me thanks

## 2014-07-21 NOTE — Telephone Encounter (Signed)
Patient said every time she eats she has excessive diarhea and gas.  Also she has some blisters on her tongue and a bad taste in her mouth. She wants to know if she should be referred to a GI Specialist.

## 2014-07-22 ENCOUNTER — Telehealth: Payer: Self-pay | Admitting: Internal Medicine

## 2014-07-22 ENCOUNTER — Other Ambulatory Visit: Payer: Self-pay | Admitting: Internal Medicine

## 2014-07-22 MED ORDER — VALSARTAN-HYDROCHLOROTHIAZIDE 160-25 MG PO TABS: 1.0000 | ORAL_TABLET | Freq: Every day | ORAL | Status: DC

## 2014-07-22 NOTE — Telephone Encounter (Signed)
Called pt. She needed her diovan HCT sent to [facility]. I have done so. Nothing further needed

## 2014-07-25 ENCOUNTER — Other Ambulatory Visit: Payer: Self-pay

## 2014-07-25 MED ORDER — FLUTICASONE PROPIONATE 50 MCG/ACT NA SUSP: 1.0000 | Freq: Two times a day (BID) | NASAL | Status: DC

## 2014-07-25 MED ORDER — METFORMIN HCL 500 MG PO TABS
ORAL_TABLET | ORAL | Status: DC
Start: 2014-07-25 — End: 2014-08-04

## 2014-07-26 ENCOUNTER — Ambulatory Visit: Payer: Medicare Other | Admitting: Internal Medicine

## 2014-08-01 ENCOUNTER — Other Ambulatory Visit: Payer: Self-pay

## 2014-08-01 MED ORDER — FLUTICASONE PROPIONATE 50 MCG/ACT NA SUSP: 1.0000 | Freq: Two times a day (BID) | NASAL | Status: DC

## 2014-08-04 ENCOUNTER — Other Ambulatory Visit (INDEPENDENT_AMBULATORY_CARE_PROVIDER_SITE_OTHER): Payer: Medicare Other

## 2014-08-04 ENCOUNTER — Encounter: Payer: Self-pay | Admitting: Internal Medicine

## 2014-08-04 ENCOUNTER — Ambulatory Visit (INDEPENDENT_AMBULATORY_CARE_PROVIDER_SITE_OTHER): Payer: Medicare Other | Admitting: Internal Medicine

## 2014-08-04 VITALS — BP 138/62 | HR 95 | Temp 98.1°F | Ht 60.0 in | Wt 168.2 lb

## 2014-08-04 DIAGNOSIS — E1140 Type 2 diabetes mellitus with diabetic neuropathy, unspecified: Secondary | ICD-10-CM

## 2014-08-04 DIAGNOSIS — E1169 Type 2 diabetes mellitus with other specified complication: Secondary | ICD-10-CM | POA: Diagnosis not present

## 2014-08-04 DIAGNOSIS — IMO0002 Reserved for concepts with insufficient information to code with codable children: Secondary | ICD-10-CM

## 2014-08-04 DIAGNOSIS — I10 Essential (primary) hypertension: Secondary | ICD-10-CM

## 2014-08-04 DIAGNOSIS — E1165 Type 2 diabetes mellitus with hyperglycemia: Secondary | ICD-10-CM | POA: Diagnosis not present

## 2014-08-04 DIAGNOSIS — E785 Hyperlipidemia, unspecified: Secondary | ICD-10-CM

## 2014-08-04 LAB — BASIC METABOLIC PANEL
BUN: 14 mg/dL (ref 6–23)
CO2: 33 mEq/L — ABNORMAL HIGH (ref 19–32)
Calcium: 9.2 mg/dL (ref 8.4–10.5)
Chloride: 101 mEq/L (ref 96–112)
Creatinine, Ser: 0.8 mg/dL (ref 0.4–1.2)
GFR: 96.93 mL/min (ref >60.00)
Glucose, Bld: 106 mg/dL — ABNORMAL HIGH (ref 70–99)
Potassium: 3.5 mEq/L (ref 3.5–5.1)
Sodium: 139 mEq/L (ref 135–145)

## 2014-08-04 LAB — LIPID PANEL
Cholesterol: 151 mg/dL (ref 0–200)
HDL: 26.5 mg/dL — ABNORMAL LOW (ref >39.00)
LDL Cholesterol: 89 mg/dL (ref 0–99)
NonHDL: 124.5
Total CHOL/HDL Ratio: 6
Triglycerides: 176 mg/dL — ABNORMAL HIGH (ref 0.0–149.0)
VLDL: 35.2 mg/dL (ref 0.0–40.0)

## 2014-08-04 LAB — HEMOGLOBIN A1C: Hgb A1c MFr Bld: 6.9 % — ABNORMAL HIGH (ref 4.6–6.5)

## 2014-08-04 MED ORDER — PIOGLITAZONE HCL 45 MG PO TABS: 45.0000 mg | ORAL_TABLET | Freq: Every day | ORAL | Status: DC

## 2014-08-04 NOTE — Assessment & Plan Note (Signed)
Prev rx'd statins including simva and prava but pt has been afraid of potential side effects, noncompliance with prior rx due to same  - following spouse cardiac problems 02/2012, pt agreed to take as rx'd start low dose mevacor 01/2013 - recheck lipids annually reviewed importance of good lipid control reviewed again with pt, esp given co-dz with diabetes mellitus + hypertension

## 2014-08-04 NOTE — Assessment & Plan Note (Addendum)
On metformin - but does not like GI side effects Change to Actos now (10/205) - we reviewed potential risk/benefit and possible side effects - pt understands and agrees to same  Continue lipid, ARB, ASA 81 -  reports home cbgs "ok" -130s Check a1c now, adjust further if needed Lab Results  Component Value Date   HGBA1C 6.9* 02/03/2014

## 2014-08-04 NOTE — Assessment & Plan Note (Signed)
increased amlodipine dose 02/2011 -  Added beta-blocker 09/2012 - no need for dual beta blocker, discontinued Bystolic and continue the Toprol Change in ARB with hctz 09/2013 on rec of pulm due to chronic cough - ?unchange symptoms - working with pulm on same Encouraged to work on lifestyle control and weight loss BP Readings from Last 3 Encounters:  08/04/14 138/62  06/30/14 104/60  06/21/14 124/74

## 2014-08-04 NOTE — Patient Instructions (Addendum)
It was good to see you today.  We have reviewed your prior records including labs and tests today  Test(s) ordered today. Your results will be released to [name] (or called to you) after review, usually within 72hours after test completion. If any changes need to be made, you will be notified at that same time.  Medications reviewed and updated stop metformin Begin Actos 45 mg once daily for diabetes No other prescription changes recommended at this time.  Over-the-counter medicines okay to try include black cohosh and Gas-X  Please schedule followup in 6 months, call sooner if problems.  Diabetes and Standards of Medical Care Diabetes is complicated. You may find that your diabetes team includes a dietitian, nurse, diabetes educator, eye doctor, and more. To help everyone know what is going on and to help you get the care you deserve, the following schedule of care was developed to help keep you on track. Below are the tests, exams, vaccines, medicines, education, and plans you will need. HbA1c test This test shows how well you have controlled your glucose over the past 2-3 months. It is used to see if your diabetes management plan needs to be adjusted.   It is performed at least 2 times a year if you are meeting treatment goals.  It is performed 4 times a year if therapy has changed or if you are not meeting treatment goals. Blood pressure test  This test is performed at every routine medical visit. The goal is less than 140/90 mm Hg for most people, but 130/80 mm Hg in some cases. Ask your health care provider about your goal. Dental exam  Follow up with the dentist regularly. Eye exam  If you are diagnosed with type 1 diabetes as a child, get an exam upon reaching the age of 75 years or older and have had diabetes for 3-5 years. Yearly eye exams are recommended after that initial eye exam.  If you are diagnosed with type 1 diabetes as an adult, get an exam within 5 years of  diagnosis and then yearly.  If you are diagnosed with type 2 diabetes, get an exam as soon as possible after the diagnosis and then yearly. Foot care exam  Visual foot exams are performed at every routine medical visit. The exams check for cuts, injuries, or other problems with the feet.  A comprehensive foot exam should be done yearly. This includes visual inspection as well as assessing foot pulses and testing for loss of sensation.  Check your feet nightly for cuts, injuries, or other problems with your feet. Tell your health care provider if anything is not healing. Kidney function test (urine microalbumin)  This test is performed once a year.  Type 1 diabetes: The first test is performed 5 years after diagnosis.  Type 2 diabetes: The first test is performed at the time of diagnosis.  A serum creatinine and estimated glomerular filtration rate (eGFR) test is done once a year to assess the level of chronic kidney disease (CKD), if present. Lipid profile (cholesterol, HDL, LDL, triglycerides)  Performed every 5 years for most people.  The goal for LDL is less than 100 mg/dL. If you are at high risk, the goal is less than 70 mg/dL.  The goal for HDL is 40 mg/dL-50 mg/dL for men and 50 mg/dL-60 mg/dL for women. An HDL cholesterol of 60 mg/dL or higher gives some protection against heart disease.  The goal for triglycerides is less than 150 mg/dL. Influenza vaccine, pneumococcal  vaccine, and hepatitis B vaccine  The influenza vaccine is recommended yearly.  It is recommended that people with diabetes who are over 86 years old get the pneumonia vaccine. In some cases, two separate shots may be given. Ask your health care provider if your pneumonia vaccination is up to date.  The hepatitis B vaccine is also recommended for adults with diabetes. Diabetes self-management education  Education is recommended at diagnosis and ongoing as needed. Treatment plan  Your treatment plan is  reviewed at every medical visit. Document Released: 08/11/2009 Document Revised: 02/28/2014 Document Reviewed: 03/16/2013 [facility] Patient Information 2015 [place], [place]. This information is not intended to replace advice given to you by your health care provider. Make sure you discuss any questions you have with your health care provider.

## 2014-08-04 NOTE — Progress Notes (Signed)
Pre visit review using our clinic review tool, if applicable. No additional management support is needed unless otherwise documented below in the visit note. 

## 2014-08-04 NOTE — Progress Notes (Signed)
Subjective:    Patient ID: Heidi Franklin, female    DOB: 02/10/1946, 68 y.o.   MRN: [id number]  HPI  Patient is here for follow up  Reviewed chronic medical issues and interval medical events  Past Medical History  Diagnosis Date  . BRONCHITIS, CHRONIC   . ALLERGIC RHINITIS   . Anxiety state, unspecified   . DEPRESSION   . DIABETES MELLITUS, TYPE II   . GERD   . HYPERLIPIDEMIA   . HYPERTENSION     Review of Systems  Constitutional: Negative for fever, fatigue and unexpected weight change.  Respiratory: Negative for cough and shortness of breath.   Cardiovascular: Negative for chest pain, palpitations and leg swelling.  Neurological: Negative for weakness.       Objective:   Physical Exam  BP 138/62  Pulse 95  Temp(Src) 98.1 F (36.7 C) (Oral)  Ht 5' (1.524 m)  Wt 168 lb 4 oz (76.318 kg)  BMI 32.86 kg/m2  SpO2 99% Wt Readings from Last 3 Encounters:  08/04/14 168 lb 4 oz (76.318 kg)  06/30/14 168 lb (76.204 kg)  06/21/14 168 lb (76.204 kg)   Constitutional: She is obese, appears well-developed and well-nourished. No distress.  Neck: Normal range of motion. Neck supple. No JVD present. No thyromegaly present.  Cardiovascular: Normal rate, regular rhythm and normal heart sounds.  No murmur heard. No BLE edema. Pulmonary/Chest: Effort normal and breath sounds normal. No respiratory distress. She has no wheezes.  Psychiatric: She has a normal mood and affect. Her behavior is normal. Judgment and thought content normal.   Lab Results  Component Value Date   WBC 10.9* 06/10/2012   HGB 12.2 06/10/2012   HCT 38.5 06/10/2012   PLT 272.0 06/10/2012   GLUCOSE 105* 11/23/2013   CHOL 142 06/23/2013   TRIG 147.0 06/23/2013   HDL 35.60* 06/23/2013   LDLDIRECT 155.1 02/09/2013   LDLCALC 77 06/23/2013   ALT 27 06/23/2013   AST 31 06/23/2013   NA 142 11/23/2013   K 3.7 11/23/2013   CL 106 11/23/2013   CREATININE 0.9 11/23/2013   BUN 14 11/23/2013   CO2 30 11/23/2013   TSH 1.81  06/10/2012   HGBA1C 6.9* 02/03/2014   MICROALBUR 1.2 11/23/2013    No results found.     Assessment & Plan:   Problem List Items Addressed This Visit   Essential hypertension      increased amlodipine dose 02/2011 -  Added beta-blocker 09/2012 - no need for dual beta blocker, discontinued Bystolic and continue the Toprol Change in ARB with hctz 09/2013 on rec of pulm due to chronic cough - ?unchange symptoms - working with pulm on same Encouraged to work on lifestyle control and weight loss BP Readings from Last 3 Encounters:  08/04/14 138/62  06/30/14 104/60  06/21/14 124/74      Relevant Orders      Basic metabolic panel   Hyperlipidemia associated with type 2 diabetes mellitus     Prev rx'd statins including simva and prava but pt has been afraid of potential side effects, noncompliance with prior rx due to same  - following spouse cardiac problems 02/2012, pt agreed to take as rx'd start low dose mevacor 01/2013 - recheck lipids annually reviewed importance of good lipid control reviewed again with pt, esp given co-dz with diabetes mellitus + hypertension     Relevant Orders      Lipid panel   Type 2 diabetes, uncontrolled, with neuropathy - Primary  On metformin - but does not like GI side effects Change to Actos now (10/205) - we reviewed potential risk/benefit and possible side effects - pt understands and agrees to same  Continue lipid, ARB, ASA 81 -  reports home cbgs "ok" -130s Check a1c now, adjust further if needed Lab Results  Component Value Date   HGBA1C 6.9* 02/03/2014      Relevant Orders      Hemoglobin A1c      Basic metabolic panel      Lipid panel

## 2014-08-05 ENCOUNTER — Telehealth: Payer: Self-pay | Admitting: Internal Medicine

## 2014-08-05 NOTE — Telephone Encounter (Signed)
emmi emailed °

## 2014-08-11 ENCOUNTER — Telehealth: Payer: Self-pay | Admitting: Internal Medicine

## 2014-08-11 ENCOUNTER — Other Ambulatory Visit: Payer: Self-pay

## 2014-08-11 MED ORDER — PIOGLITAZONE HCL 45 MG PO TABS: 45.0000 mg | ORAL_TABLET | Freq: Every day | ORAL | Status: DC

## 2014-08-11 NOTE — Telephone Encounter (Signed)
Patient states Dr. Asa Lente was to call in a script in place of metformin to [facility] on [address] in [place].  Patient states pharmacy has not received it.

## 2014-08-23 ENCOUNTER — Encounter: Payer: Self-pay | Admitting: Internal Medicine

## 2014-08-23 ENCOUNTER — Ambulatory Visit (INDEPENDENT_AMBULATORY_CARE_PROVIDER_SITE_OTHER): Payer: Medicare Other | Admitting: Internal Medicine

## 2014-08-23 VITALS — BP 124/64 | HR 90 | Ht 60.0 in | Wt 170.8 lb

## 2014-08-23 DIAGNOSIS — R05 Cough: Secondary | ICD-10-CM

## 2014-08-23 DIAGNOSIS — R058 Other specified cough: Secondary | ICD-10-CM

## 2014-08-23 MED ORDER — GABAPENTIN 100 MG PO CAPS: 100.0000 mg | ORAL_CAPSULE | Freq: Three times a day (TID) | ORAL | Status: DC

## 2014-08-23 MED ORDER — GABAPENTIN 100 MG PO CAPS: 100.0000 mg | ORAL_CAPSULE | Freq: Four times a day (QID) | ORAL | Status: DC

## 2014-08-23 NOTE — Assessment & Plan Note (Signed)
CT sinus 11/08/11>>Negative paranasal sinuses.  Rightward nasal septal deviation and possible rhinitis. 01/13/12>>RAST negative, IgE 22.7 PFT 11/07/11>>FEV1 1.79 (102%),  Ratio 82, TLC 4.18 (104%), DLCO 69%, positive BD only in terms of fef 25/75  - hfa75% 03/04/2013  -med calendar 11/23/2012 , 03/18/2013 , 08/26/2013 , 12/06/2013, 06/21/2014  -methacholine challenge test  12/24/13 > neg  - try off singulair 12/31/2013 > no worse  - added neurontin 100 mg qid  05/20/14 but did not take consistently as of 06/30/2014 > increased to 100 qid 08/23/2014   Overall much better with rare need for any inhalers at all despite misunderstanding (again) instructions on how to takes meds per user friendly med calendar   I had an extended discussion with the patient today lasting 15 to 20 minutes of a 25 minute visit on the following issues:  Each maintenance medication was reviewed in detail including most importantly the difference between maintenance and as needed and under what circumstances the prns are to be used. This was done in the context of a medication calendar review which provided the patient with a user-friendly unambiguous mechanism for medication administration and reconciliation and provides an action plan for all active problems. It is critical that this be shown to every doctor  for modification during the office visit if necessary so the patient can use it as a working document.

## 2014-08-23 NOTE — Patient Instructions (Signed)
Increase the gabapentin to 100 mg to four times daily   If not doing great, first step is See [name] NP with pill organizers and  with all your medications, even over the counter meds, separated in two separate bags, the ones you take no matter what vs the ones you stop once you feel better and take only as needed when you feel you need them.   [name]  will generate for you a new user friendly medication calendar that will put [place] all on the same page re: your medication use.     Without this process, it simply isn't possible to assure that we are providing  your outpatient care  with  the attention to detail we feel you deserve.   If we cannot assure that you're getting that kind of care,  then we cannot manage your problem effectively from this clinic.  Once you have seen [name] and we are sure that we're all on the same page with your medication use she will arrange follow up with me.

## 2014-08-23 NOTE — Progress Notes (Signed)
Subjective:     Patient ID: Heidi Franklin, female   DOB: 10/19/1946    MRN: [id number]  Brief patient profile:  68 yobf never smoker with chronic cough  since age 69's only resolves for a week at a time then recurs daily,  dx as  post-nasal drip with nasal septal deviation, and GERD with neg MCT 11/2013       History of Present Illness  08/03/2012 f/u ov/Heidi Franklin cc cough for decades, better on present rx than for many years prior to pulmonary clinic changes, not using calendar, poor hfa  Technique but no limiting sob.  rec Change zyrtec to where you take it only if needed for itching/ sneezing  Work on inhaler technique:      Ok to use symbicort with spacer if not improving   07/29/2013 f/u ov/Heidi Franklin re: chronic cough since age 62  Chief Complaint  Patient presents with  . Follow-up    Breathing is some worse since the last visit. She is needing xopenex for rescue approx 2 x per wk. She c/o hoarseness and ear pain.    >>Neurontin Three times a day  rx , change b/p meds to tribenzor  08/26/2013 Follow up and Med review  Returns for follow up and med review.  We reviewed all her meds and organized them into a med calendar with pt education. Pt did not fill neurontin rx .  Can not afford Tribenzor and wants to go back to norvasc and losartan. Cough is some better . Comes and goes. Seen by ENT says laryngoscopy was benign. No records noted. Does still have PND, HA, bilateral ear congestion/popping. Using chlortrimeton At bedtime  Advised can use more often if needed.  rec No change in rx  10/26/2013 f/u ov/Heidi Franklin re: chronic cough back on cozar Chief Complaint  Patient presents with  . Follow-up    Breathing and cough doing well.  Over weekend developed cough non-produtive  says never really completely free of cough, now worse, not compliant with symbicort.  >Stop losartan, Start diovan 160 /25 one daily  Change symbicort to 2 puffs every 12 hours As needed  Cough/wheeze    12/06/2013 Follow up  and Med review  Patient returns for a followup visit and medication review. We reviewed all her medications organized them into a medication calendar with patient education. Appears the patient is taking her medications correctly. Since last visit. Patient reports that she is doing better. Cough is not as bad. Last ov Symbicort was changed to As needed  Use only.  She is using on average every other day. Not sure if she needs or not.  No fever, chest pain , edema or wheezing. No xopenex use since last ov.  rec MCT > neg  12/31/2013 f/u ov/Heidi Franklin re: uacs Chief Complaint  Patient presents with  . Follow-up    States breathing is doing well. C/o SOB with moderate activity and dry cough. Denies CP.    only gets a tickle in throat when out in crowds, still using symbicort qod but no change on days when uses it. Not really limited from desired activities by cough or sob rec Try off singulair to see what difference if any this makes See calendar   04/07/2014 f/u ov/Heidi Franklin re: cough x decades Chief Complaint  Patient presents with  . Follow-up    Pt c/o increased cough and SOB for the past wk. Cough is non prod. She also has noticed minimal wheezing. She has been using symbicort  daily and xopenex HFA approx 2 x per wk.   No better with asthma rx  rec Add neurontin 100 tid > did not start   05/20/2014 f/u ov/Heidi Franklin re: brought calendar and not using much symbicort / confusing maint vs prns Chief Complaint  Patient presents with  . Follow-up    Cough has improved since her last visit. Still c/o "tickle" in throat causing her to cough. Cough is non prod. She is using rescue inhaler once per wk on average.   never started neurontin -  Not limited by breathing from desired activities     >>Stop symbicort to see whether you need a lot more xopenex and if so restart it  Start gabapentin 100 mg four times daily Increase chlortrimeton 4 mg 2 at bedtime    06/21/2014 Follow up and Med review  Patient  returns for a followup visit and medication review. We reviewed all her medications organized them into a medication calendar with patient education. Appears the patient is not using her as needed meds for cough and drainage Did not start Gabapentin yet, wanted to check to make sure she was suppose to start. -"wanted to know what it was for"  We discussed the use for gabapentin for chronic cough along with side effects.  Says her cough is about the same, comes and goes with post nasal drainage.  rec Remember to use your Delsym for cough As needed   Remember to use Chlorphenaramine for drainage and throat clearing As needed     06/30/2014 f/u ov/Heidi Franklin re:  Uacs/ neg MCT / no med calendar  Chief Complaint  Patient presents with  . Acute Visit    Pt c/o cough for the past 2 days. Cough is mainly non prod, but did have very minimal hemoptysis this am.   cough worse at hs, not clear she's really following med calendar at all. Admits never in her life cough free more than a few days. Mostly sob when coughing  rec Change neurontin to 100 mg four times a day Take the pm pepcid and 2 chlortrimeton at least an hour or two before bedtime For cough >>Take delsym two tsp every 12 hours and supplement if needed with  tramadol 50 mg up to 2 every 4 hours to suppress the urge to cough. Swallowing water or using ice chips/non mint and menthol containing candies (such as lifesavers or sugarless jolly ranchers) are also effective.  You should rest your voice and avoid activities that you know make you cough. Once you have eliminated the cough for 3 straight days try reducing the tramadol first,  then the delsym as tolerated.   Prednisone 10 mg take  4 each am x 2 days,   2 each am x 2 days,  1 each am x 2 days and stop zpak   08/23/2014 f/u ov/Heidi Franklin re: chronic hoarseness / cough  Chief Complaint  Patient presents with  . Follow-up    Pt states that her cough has improved since the last visit. Her breathing  is doing well today.  She is using xopenex once every 2 wks on average and using symbicort the same.     Not following med calendar/ multiple errors. Only using neurotin 100 tid   Not limited by breathing from desired activities  But very sedentary    No obvious day to day or daytime variabilty or assoc  cp or chest tightness, subjective wheeze overt sinus or hb symptoms. No unusual exp hx or  h/o childhood pna/ asthma or knowledge of premature birth.   Also denies any obvious fluctuation of symptoms with weather or environmental changes or other aggravating or alleviating factors except as outlined above   Current Medications, Allergies, Complete Past Medical History, Past Surgical History, Family History, and Social History were reviewed in [employer] record.  ROS  The following are not active complaints unless bolded sore throat, dysphagia, dental problems, itching, sneezing,  nasal congestion or excess/ purulent secretions, ear ache,   fever, chills, sweats, unintended wt loss, pleuritic or exertional cp, hemoptysis,  orthopnea pnd or leg swelling, presyncope, palpitations, heartburn, abdominal pain, anorexia, nausea, vomiting, diarrhea  or change in bowel or urinary habits, change in stools or urine, dysuria,hematuria,  rash, arthralgias, visual complaints, headache, numbness weakness or ataxia or problems with walking or coordination,  change in mood/affect or memory.            Objective:   Physical Exam  Exam:  GEN: A/Ox3; pleasant , NAD, well nad mild hoarse amb bf nad    05/20/2014       168   >168 06/21/2014 >  06/30/2014  168 > 08/23/2014  170   HEENT:  [place]/AT,  EACs-clear, TMs-wnl, NOSE-clear drainage  THROAT-clear, no lesions, no postnasal drip or exudate noted.   NECK:  Supple w/ fair ROM; no JVD; normal carotid impulses w/o bruits; no thyromegaly or nodules palpated; no lymphadenopathy.  RESP  Clear to A and P with no accessory muscle use, no dullness  to percussion, less prominent pseudowheeze  CARD:  RRR, no m/r/g  , no peripheral edema, pulses intact, no cyanosis or clubbing.  GI:   Soft & nt; nml bowel sounds; no organomegaly or masses detected.  Musco: Warm bil, no deformities or joint swelling noted.   Neuro: alert, no focal deficits noted.    Skin: Warm, no lesions or rashes         Assessment:

## 2014-08-30 DIAGNOSIS — E119 Type 2 diabetes mellitus without complications: Secondary | ICD-10-CM | POA: Diagnosis not present

## 2014-10-03 DIAGNOSIS — M65312 Trigger thumb, left thumb: Secondary | ICD-10-CM | POA: Diagnosis not present

## 2014-10-17 DIAGNOSIS — Z1231 Encounter for screening mammogram for malignant neoplasm of breast: Secondary | ICD-10-CM | POA: Diagnosis not present

## 2014-10-17 LAB — HM MAMMOGRAPHY

## 2014-10-25 ENCOUNTER — Telehealth: Payer: Self-pay | Admitting: *Deleted

## 2014-10-25 MED ORDER — SERTRALINE HCL 50 MG PO TABS: 50.0000 mg | ORAL_TABLET | Freq: Every day | ORAL | Status: DC

## 2014-10-25 NOTE — Telephone Encounter (Signed)
Left msg on triage pt is needing refill on her sertraline.Durene Cal electronically...Johny Chess

## 2014-10-27 ENCOUNTER — Telehealth: Payer: Self-pay | Admitting: Internal Medicine

## 2014-10-27 MED ORDER — AZITHROMYCIN 250 MG PO TABS: ORAL_TABLET | ORAL | Status: DC

## 2014-10-27 MED ORDER — PREDNISONE 10 MG PO TABS: ORAL_TABLET | ORAL | Status: DC

## 2014-10-27 NOTE — Telephone Encounter (Signed)
Called and spoke to pt. Informed pt of the recs per MW. Both rx sent to preferred pharmacy. Pt verbalized understanding and denied any further questions or concerns at this time.

## 2014-10-27 NOTE — Telephone Encounter (Signed)
zpak If not improving ok to have on hand if neede to go ahead and  take Prednisone 10 mg take  4 each am x 2 days,   2 each am x 2 days,  1 each am x 2 days and stop

## 2014-10-27 NOTE — Telephone Encounter (Signed)
Called and spoke with pt and she stated that she has had a head cold and now this is going into her chest.  She is having cough that she cannot get anything up yet, low grade fever at night and she has tried all otc meds.  MW please advise. Thanks  Allergies  Allergen Reactions  . Penicillins Rash    Current Outpatient Prescriptions on File Prior to Visit  Medication Sig Dispense Refill  . ALPRAZolam (XANAX) 0.5 MG tablet take 1 tablet by mouth every 8 hours if needed 30 tablet 2  . amLODipine (NORVASC) 10 MG tablet Take 1 tablet (10 mg total) by mouth every morning. 90 tablet 3  . aspirin 81 MG tablet Take 81 mg by mouth every morning.     . calcium carbonate (OS-CAL) 600 MG TABS Take 600 mg by mouth daily with breakfast.     . chlorpheniramine (CHLOR-TRIMETON) 4 MG tablet 1 tab by mouth at bedtime and every 4 hours as needed for drainage/drippy nose/sneezing    . dextromethorphan (DELSYM) 30 MG/5ML liquid 2 tsp every 12 hours as needed for cough    . famotidine (PEPCID) 20 MG tablet One at bedtime 30 tablet 11  . fluticasone (FLONASE) 50 MCG/ACT nasal spray Place 1 spray into both nostrils 2 (two) times daily. 0.05 g 6  . gabapentin (NEURONTIN) 100 MG capsule Take 1 capsule (100 mg total) by mouth 4 (four) times daily. 120 capsule 11  . glucose blood (ONE TOUCH ULTRA TEST) test strip 1 each by Other route daily. Use 1 strips to check blood sugar daily. Dx 250.00 100 each 2  . HYDROcodone-homatropine (HYCODAN) 5-1.5 MG/5ML syrup 1-2 tsp every 4-6 hours as needed if still coughing with the delsym 240 mL 0  . Lancets (ONETOUCH ULTRASOFT) lancets PRN    . levalbuterol (XOPENEX HFA) 45 MCG/ACT inhaler Inhale 2 puffs into the lungs every 4 (four) hours as needed for wheezing or shortness of breath.    . lovastatin (MEVACOR) 20 MG tablet take 1 tablet by mouth at bedtime 90 tablet 3  . Multiple Vitamins-Minerals (CENTRUM SILVER PO) Take 1 tablet by mouth daily.    Marland Kitchen omeprazole (PRILOSEC) 40 MG  capsule TAKE 1 CAPSULE DAILY 90 capsule 3  . pioglitazone (ACTOS) 45 MG tablet Take 1 tablet (45 mg total) by mouth daily. 30 tablet 5  . potassium chloride SA (K-DUR,KLOR-CON) 20 MEQ tablet TAKE 1 TABLET DAILY 90 tablet 3  . sertraline (ZOLOFT) 50 MG tablet Take 1 tablet (50 mg total) by mouth daily. 90 tablet 3  . valsartan-hydrochlorothiazide (DIOVAN HCT) 160-25 MG per tablet Take 1 tablet by mouth daily. 90 tablet 3   No current facility-administered medications on file prior to visit.

## 2014-11-13 LAB — HM MAMMOGRAPHY: HM Mammogram: NEGATIVE

## 2014-11-21 ENCOUNTER — Encounter: Payer: Self-pay | Admitting: Internal Medicine

## 2014-11-22 ENCOUNTER — Telehealth: Payer: Self-pay | Admitting: Internal Medicine

## 2014-11-22 DIAGNOSIS — M170 Bilateral primary osteoarthritis of knee: Secondary | ICD-10-CM | POA: Diagnosis not present

## 2014-11-28 LAB — HM DIABETES EYE EXAM

## 2014-12-22 DIAGNOSIS — M65312 Trigger thumb, left thumb: Secondary | ICD-10-CM | POA: Diagnosis not present

## 2014-12-22 DIAGNOSIS — M25512 Pain in left shoulder: Secondary | ICD-10-CM | POA: Diagnosis not present

## 2015-01-04 ENCOUNTER — Other Ambulatory Visit: Payer: Self-pay | Admitting: Internal Medicine

## 2015-01-05 NOTE — Telephone Encounter (Signed)
rx faxed to [facility] 

## 2015-01-09 ENCOUNTER — Other Ambulatory Visit: Payer: Self-pay | Admitting: Internal Medicine

## 2015-01-11 DIAGNOSIS — M65312 Trigger thumb, left thumb: Secondary | ICD-10-CM | POA: Diagnosis not present

## 2015-01-24 DIAGNOSIS — Z4789 Encounter for other orthopedic aftercare: Secondary | ICD-10-CM | POA: Diagnosis not present

## 2015-02-07 ENCOUNTER — Other Ambulatory Visit (INDEPENDENT_AMBULATORY_CARE_PROVIDER_SITE_OTHER): Payer: Medicare Other

## 2015-02-07 ENCOUNTER — Ambulatory Visit: Payer: Medicare Other | Admitting: Internal Medicine

## 2015-02-07 ENCOUNTER — Ambulatory Visit (INDEPENDENT_AMBULATORY_CARE_PROVIDER_SITE_OTHER): Payer: Medicare Other | Admitting: Internal Medicine

## 2015-02-07 ENCOUNTER — Encounter: Payer: Self-pay | Admitting: Internal Medicine

## 2015-02-07 VITALS — BP 124/64 | HR 95 | Temp 98.9°F | Resp 16 | Ht 60.0 in | Wt 178.0 lb

## 2015-02-07 DIAGNOSIS — E1140 Type 2 diabetes mellitus with diabetic neuropathy, unspecified: Secondary | ICD-10-CM

## 2015-02-07 DIAGNOSIS — E119 Type 2 diabetes mellitus without complications: Secondary | ICD-10-CM

## 2015-02-07 DIAGNOSIS — IMO0002 Reserved for concepts with insufficient information to code with codable children: Secondary | ICD-10-CM

## 2015-02-07 DIAGNOSIS — E1165 Type 2 diabetes mellitus with hyperglycemia: Secondary | ICD-10-CM

## 2015-02-07 LAB — HEMOGLOBIN A1C: Hgb A1c MFr Bld: 6.2 % (ref 4.6–6.5)

## 2015-02-07 MED ORDER — SITAGLIPTIN PHOSPHATE 100 MG PO TABS: 100.0000 mg | ORAL_TABLET | Freq: Every day | ORAL | Status: DC

## 2015-02-07 NOTE — Patient Instructions (Signed)
We will switch the actos to [place]. Take 1 pill a day. It should help you to lose some weight instead of gaining weight.  Diabetes Mellitus and Food It is important for you to manage your blood sugar (glucose) level. Your blood glucose level can be greatly affected by what you eat. Eating healthier foods in the appropriate amounts throughout the day at about the same time each day will help you control your blood glucose level. It can also help slow or prevent worsening of your diabetes mellitus. Healthy eating may even help you improve the level of your blood pressure and reach or maintain a healthy weight.  HOW CAN FOOD AFFECT ME? Carbohydrates Carbohydrates affect your blood glucose level more than any other type of food. Your dietitian will help you determine how many carbohydrates to eat at each meal and teach you how to count carbohydrates. Counting carbohydrates is important to keep your blood glucose at a healthy level, especially if you are using insulin or taking certain medicines for diabetes mellitus. Alcohol Alcohol can cause sudden decreases in blood glucose (hypoglycemia), especially if you use insulin or take certain medicines for diabetes mellitus. Hypoglycemia can be a life-threatening condition. Symptoms of hypoglycemia (sleepiness, dizziness, and disorientation) are similar to symptoms of having too much alcohol.  If your health care provider has given you approval to drink alcohol, do so in moderation and use the following guidelines:  Women should not have more than one drink per day, and men should not have more than two drinks per day. One drink is equal to:  12 oz of beer.  5 oz of wine.  1 oz of hard liquor.  Do not drink on an empty stomach.  Keep yourself hydrated. Have water, diet soda, or unsweetened iced tea.  Regular soda, juice, and other mixers might contain a lot of carbohydrates and should be counted. WHAT FOODS ARE NOT RECOMMENDED? As you make food  choices, it is important to remember that all foods are not the same. Some foods have fewer nutrients per serving than other foods, even though they might have the same number of calories or carbohydrates. It is difficult to get your body what it needs when you eat foods with fewer nutrients. Examples of foods that you should avoid that are high in calories and carbohydrates but low in nutrients include:  Trans fats (most processed foods list trans fats on the Nutrition Facts label).  Regular soda.  Juice.  Candy.  Sweets, such as cake, pie, doughnuts, and cookies.  Fried foods. WHAT FOODS CAN I EAT? Have nutrient-rich foods, which will nourish your body and keep you healthy. The food you should eat also will depend on several factors, including:  The calories you need.  The medicines you take.  Your weight.  Your blood glucose level.  Your blood pressure level.  Your cholesterol level. You also should eat a variety of foods, including:  Protein, such as meat, poultry, fish, tofu, nuts, and seeds (lean animal proteins are best).  Fruits.  Vegetables.  Dairy products, such as milk, cheese, and yogurt (low fat is best).  Breads, grains, pasta, cereal, rice, and beans.  Fats such as olive oil, trans fat-free margarine, canola oil, avocado, and olives. DOES EVERYONE WITH DIABETES MELLITUS HAVE THE SAME MEAL PLAN? Because every person with diabetes mellitus is different, there is not one meal plan that works for everyone. It is very important that you meet with a dietitian who will help you create  a meal plan that is just right for you. Document Released: 07/11/2005 Document Revised: 10/19/2013 Document Reviewed: 09/10/2013 [facility] Patient Information 2015 [place], [place]. This information is not intended to replace advice given to you by your health care provider. Make sure you discuss any questions you have with your health care provider.

## 2015-02-07 NOTE — Progress Notes (Signed)
Pre visit review using our clinic review tool, if applicable. No additional management support is needed unless otherwise documented below in the visit note. 

## 2015-02-10 NOTE — Progress Notes (Signed)
   Subjective:    Patient ID: Heidi Franklin, female    DOB: [birth year], 69 y.o.   MRN: [id number]  HPI The patient is a 69 YO female who is coming in to follow up on her diabetes. Since starting actos she feels she is going up in weight. She is trying to lose weight and feels like this holds her back. She is doing okay with compliance otherwise. She has been trying to exercise but isn't getting 3 days per week.   Review of Systems  Constitutional: Negative for fever, activity change, appetite change, fatigue and unexpected weight change.  Respiratory: Negative.   Cardiovascular: Negative.   Gastrointestinal: Negative.   Neurological: Negative.   Psychiatric/Behavioral: Negative.       Objective:   Physical Exam  Constitutional: She is oriented to person, place, and time. She appears well-developed and well-nourished.  Overweight  HENT:  Head: Normocephalic and atraumatic.  Eyes: EOM are normal.  Neck: Normal range of motion.  Cardiovascular: Normal rate and regular rhythm.   Pulmonary/Chest: Effort normal and breath sounds normal.  Abdominal: Soft.  Neurological: She is alert and oriented to person, place, and time.  Skin: Skin is warm and dry.   Filed Vitals:   02/07/15 1435  BP: 124/64  Pulse: 95  Temp: 98.9 F (37.2 C)  TempSrc: Oral  Resp: 16  Height: 5' (1.524 m)  Weight: 178 lb (80.74 kg)  SpO2: 99%      Assessment & Plan:

## 2015-02-10 NOTE — Assessment & Plan Note (Signed)
Recheck HgA1c today, change actos to [place] since she is gaining weight. Continue metformin. Talked with her about weight los as a good thing to help control her sugars. She will try. She is on ARB.

## 2015-02-21 ENCOUNTER — Other Ambulatory Visit: Payer: Self-pay | Admitting: Internal Medicine

## 2015-02-21 DIAGNOSIS — Z4789 Encounter for other orthopedic aftercare: Secondary | ICD-10-CM | POA: Diagnosis not present

## 2015-02-28 ENCOUNTER — Telehealth: Payer: Self-pay | Admitting: Internal Medicine

## 2015-02-28 NOTE — Telephone Encounter (Signed)
Appt has been made for 03/01/15 @ 11:00...Heidi Franklin

## 2015-02-28 NOTE — Telephone Encounter (Signed)
PLEASE NOTE: All timestamps contained within this report are represented as [place] Standard Time. CONFIDENTIALTY NOTICE: This fax transmission is intended only for the addressee. It contains information that is legally privileged, confidential or otherwise protected from use or disclosure. If you are not the intended recipient, you are strictly prohibited from reviewing, disclosing, copying using or disseminating any of this information or taking any action in reliance on or regarding this information. If you have received this fax in error, please notify us immediately by telephone so that we can arrange for its return to [place]. Phone: [phone], Toll-Free: [phone], Fax: [phone] Page: 1 of 1 Call Id: [id number] [facility] - Client [place] Patient Name: Heidi Franklin DOB: 1946/04/24 Initial Comment Caller states in her sleep, gets stiff as a board and moaning. husband says it is hard for her to wake up, has been under a lot of stress Nurse Assessment Nurse: Donalynn Furlong, RN, Myna Hidalgo Date/Time ([place] Time): 02/28/2015 10:51:18 AM Confirm and document reason for call. If symptomatic, describe symptoms. ---Caller states in her sleep, gets stiff as a board and moaning. husband says it is hard for her to wake up, has been under a lot of stress Has the patient traveled out of the country within the last 30 days? ---No Does the patient require triage? ---Yes Related visit to physician within the last 2 weeks? ---Yes Does the PT have any chronic conditions? (i.e. diabetes, asthma, etc.) ---Yes List chronic conditions. ---anxiety, high blood pressure, high cholesterol, diabetes Guidelines Guideline Title Affirmed Question Affirmed Notes Anxiety and Panic Attack Symptoms interfere with sleep or daily activities Final Disposition User See PCP When Office is Open (within 3 days) Donalynn Furlong, RN, Myna Hidalgo Comments TC back to pt  to update that appt is May 4 th at 10 am with Dr Linna Darner

## 2015-03-01 ENCOUNTER — Ambulatory Visit (INDEPENDENT_AMBULATORY_CARE_PROVIDER_SITE_OTHER): Payer: Medicare Other | Admitting: Internal Medicine

## 2015-03-01 ENCOUNTER — Encounter: Payer: Self-pay | Admitting: Internal Medicine

## 2015-03-01 VITALS — BP 138/60 | HR 87 | Temp 98.6°F | Ht 60.0 in | Wt 181.2 lb

## 2015-03-01 DIAGNOSIS — G479 Sleep disorder, unspecified: Secondary | ICD-10-CM | POA: Diagnosis not present

## 2015-03-01 DIAGNOSIS — F4322 Adjustment disorder with anxiety: Secondary | ICD-10-CM

## 2015-03-01 DIAGNOSIS — E119 Type 2 diabetes mellitus without complications: Secondary | ICD-10-CM

## 2015-03-01 DIAGNOSIS — Z87898 Personal history of other specified conditions: Secondary | ICD-10-CM

## 2015-03-01 DIAGNOSIS — L819 Disorder of pigmentation, unspecified: Secondary | ICD-10-CM | POA: Diagnosis not present

## 2015-03-01 DIAGNOSIS — Z8659 Personal history of other mental and behavioral disorders: Secondary | ICD-10-CM

## 2015-03-01 MED ORDER — KETOCONAZOLE 2 % EX CREA: 1.0000 "application " | TOPICAL_CREAM | Freq: Every day | CUTANEOUS | Status: DC

## 2015-03-01 NOTE — Progress Notes (Signed)
Pre visit review using our clinic review tool, if applicable. No additional management support is needed unless otherwise documented below in the visit note. 

## 2015-03-01 NOTE — Progress Notes (Signed)
   Subjective:    Patient ID: Heidi Franklin, female    DOB: 04/03/1946, 69 y.o.   MRN: [id number]  HPI Intermittently the last 6 months she's had some disturbance in sleep. She does have difficulty going to sleep but the main issue is that her husband noticed her as stiffening intermittently with associated "moaning". There is no definite apnea described and there is no definite restless leg phenomena. She does have dreams of dead  people and also dreams she's being pursued. She describes past history of panic attacks as sensation that she was "dying". She has anxiety as she is " under a lot of stress".     Review of Systems   She denies depression or anorexia. The  last TSH on record was 1.81 on 06/10/12  She's had some burning in the gluteal area above the rectum.this been present several months. Initial presentation was itching. She's been using cocoa butter to this area.   Fasting blood sugars average 120. While taking metformin she had diarrhea. On pioglitazone she had a weight gain from 169  to181.  Her last A1c was 6.2% on 4/12. The pioglitazone was discontinued at that time.       Objective:   Physical Exam  Gen.:  Adequately nourished; in no acute distress.BMI: 35.4 Eyes: Extraocular motion intact; no lid lag , proptosis , or nystagmus Neck: full ROM; no masses ; thyroid normal  Heart: Normal rhythm and rate without significant murmur, gallop, or extra heart sounds Lungs: Chest clear to auscultation without rales,rales, wheezes Neuro:Deep tendon reflexes are equal and within normal limits; no tremor  Skin/Nails: Warm and dry ; no onycholysis. 2 x 4.5 hypopigmented lesion without cellulitis, vesicle formation, or pustules at the right supragluteal crease area. Lymphatic: no cervical or axillary LA Psych: Normally communicative and interactive; no abnormal mood or affect clinically.         Assessment & Plan:  #1 sleep disorder manifested as difficulty going to  sleep,stiffening, moaning,and abnormal dreaming/nightmares  #2 hypopigmented gluteal lesion;? fungal  #3 diabetes; excellent control. A1c in the prediabetes range  See orders

## 2015-03-01 NOTE — Patient Instructions (Addendum)
To prevent sleep dysfunction follow these instructions for sleep hygiene. Do not read, watch TV, or eat in bed. Do not get into bed until you are ready to turn off the light &  to go to sleep. Do not ingest stimulants ( decongestants, diet pills, nicotine, caffeine) after the evening meal.Do not take daytime naps.Cardiovascular exercise, this can be as simple a program as walking, is recommended 30-45 minutes 3-4 times per week. If you're not exercising you should take 6-8 weeks to build up to this level. The  referrals will be scheduled and you'll be notified of the time.Please call the Referral Co-Ordinator @ [phone] if you have not been notified of appointment time within 7-10 days.   The following nutritional changes may help prevent Diabetes progression & complications.  White carbohydrates (potatoes, rice, bread, and pasta) cause a high spike of the sugar level which stays elevated for a significant period of time (called sugar"load").  For example a  baked potato has a cup of sugar and a  french fry  2 teaspoons of sugar.  More complex carbs such as yams, wild  rice, whole grained bread &  wheat pasta have been much lower spike and persistent load of sugar than the white carbs. The pancreas excretes excess insulin in response to the high spike & load of sugar . Over time the pancreas can actually run out of insulin necessitating insulin shots.

## 2015-03-03 ENCOUNTER — Telehealth: Payer: Self-pay | Admitting: *Deleted

## 2015-03-03 NOTE — Telephone Encounter (Signed)
[place] [facility] - Client [name] Patient Name: Heidi Franklin Gender: Female DOB: August 11, 1946 Age: 69 Y 11 M 17 D Return Phone Number: [phone] (Primary) Address: [address] City/State/Zip: [place] [address] Client [name] - Client Client Site [name] - Day Physician [name] Contact Type Call Call Type Triage / Clinical Relationship To Patient Self Appointment Disposition EMR Appointment Scheduled Info pasted into Epic Yes Return Phone Number [phone] (Primary) Chief Complaint Strange, Abnormal, or Paranoid Behavior Initial Comment Caller states in her sleep, gets stiff as a board and moaning. husband says it is hard for her to wake up, has been under a lot of stress PreDisposition Call Doctor Nurse Assessment Nurse: Donalynn Furlong, RN, Myna Hidalgo Date/Time [place] Time): 02/28/2015 10:51:18 AM Confirm and document reason for call. If symptomatic, describe symptoms. ---Caller states in her sleep, gets stiff as a board and moaning. husband says it is hard for her to wake up, has been under a lot of stress Has the patient traveled out of the country within the last 30 days? ---No Does the patient require triage? ---Yes Related visit to physician within the last 2 weeks? ---Yes Does the PT have any chronic conditions? (i.e. diabetes, asthma, etc.) ---Yes List chronic conditions. ---anxiety, high blood pressure, high cholesterol, diabetes Guidelines Guideline Title Affirmed Question Affirmed Notes Nurse Date/Time (Eastern Time) Anxiety and Panic Attack Symptoms interfere with sleep or daily activities Donalynn Furlong, RN, Myna Hidalgo 02/28/2015 10:54:52 AM Disp. Time [place] Ghazi Time) Disposition Final User 02/28/2015 10:59:43 AM See PCP When Office is Open (within 3 days) Yes Donalynn Furlong, RN, Myna Hidalgo Caller Understands: Yes Disagree/Comply: Comply PLEASE NOTE: All  timestamps contained within this report are represented as [place] Standard Time. CONFIDENTIALTY NOTICE: This fax transmission is intended only for the addressee. It contains information that is legally privileged, confidential or otherwise protected from use or disclosure. If you are not the intended recipient, you are strictly prohibited from reviewing, disclosing, copying using or disseminating any of this information or taking any action in reliance on or regarding this information. If you have received this fax in error, please notify us immediately by telephone so that we can arrange for its return to [place]. Phone: [phone], Toll-Free: [phone], Fax: [phone] Page: 2 of 2 Call Id: [id number] Care Advice Given Per Guideline SEE PCP WITHIN 3 DAYS: You need to be examined within 2 or 3 days. Call your doctor during regular office hours and make an appointment. (Note: if office will be open tomorrow, tell caller to call then, not in 3 days). HEALTHY LIFESTYLE BASICS: * Sleep - Try to get sufficient amount of sleep. Most people need 7-8 hours of sleep each night. * Regular exercise will improve your overall health, improve your mood, and is a simple method to reduce stress. * Eat a balanced healthy diet. * Drink adequate liquids - 6-8 glasses of water daily. AVOID CAFFEINE: Avoid caffeine-containing beverages. (Reason: it is a stimulant and can aggravate anxiety) Examples include coffee, tea, colas. AVOID TRIGGERS OF ANXIETY: * For smokers - Stop or reduce your smoking (Reason: nicotine is a stimulant) * Limit your alcohol consumption to no more than 2 drinks a day. After dinner drinking causes insomnia 3-4 hours after falling asleep. * Avoid diet pills (Reason: they act as stimulants.) * Talk to your doctor before taking any herbal supplements (Reason: some have side effects) STRESS REDUCTION: * Take regular breaks throughout the  day * Learn relaxation technique/meditation/yoga CALL BACK IF:  * You feel like harming yourself * You become worse. CARE ADVICE given per Anxiety and Panic Attack (Adult) guideline. After Care Instructions Given Call Event Type User Date / Time Descriptio

## 2015-03-05 ENCOUNTER — Other Ambulatory Visit: Payer: Self-pay | Admitting: Internal Medicine

## 2015-03-20 ENCOUNTER — Ambulatory Visit (INDEPENDENT_AMBULATORY_CARE_PROVIDER_SITE_OTHER): Payer: Medicare Other | Admitting: Adult Health

## 2015-03-20 ENCOUNTER — Encounter: Payer: Self-pay | Admitting: Adult Health

## 2015-03-20 ENCOUNTER — Ambulatory Visit (INDEPENDENT_AMBULATORY_CARE_PROVIDER_SITE_OTHER)
Admission: RE | Admit: 2015-03-20 | Discharge: 2015-03-20 | Disposition: A | Discharge status: Home / Self Care | Payer: Medicare Other | Source: Ambulatory Visit | Attending: Adult Health | Admitting: Adult Health

## 2015-03-20 VITALS — BP 134/78 | HR 100 | Temp 98.1°F | Ht 60.0 in | Wt 183.2 lb

## 2015-03-20 DIAGNOSIS — R059 Cough, unspecified: Secondary | ICD-10-CM

## 2015-03-20 DIAGNOSIS — R05 Cough: Secondary | ICD-10-CM | POA: Diagnosis not present

## 2015-03-20 DIAGNOSIS — R509 Fever, unspecified: Secondary | ICD-10-CM | POA: Diagnosis not present

## 2015-03-20 DIAGNOSIS — R058 Other specified cough: Secondary | ICD-10-CM

## 2015-03-20 MED ORDER — AZITHROMYCIN 250 MG PO TABS: ORAL_TABLET | ORAL | Status: AC

## 2015-03-20 NOTE — Assessment & Plan Note (Signed)
Extensive workup for cough with neg MCT , CT sinus and nml PFT  Cont w/ cough control regimen Check cxr today  Have her return for close follow up , if not resolving may consider d/c neurontin   Plan  Delsym 2 tsp Twice daily  As needed  Cough  Chlortrimeton 4mg  1 every 4hrs as needed for drainage.  Mucinex Twice daily  As needed  Congestion .  Zpack to have on hold if symptoms worsen with discolored mucus.  Chest xray today  Fluids and rest  Follow up Dr. Melvyn Novas in 4 weeks and As needed

## 2015-03-20 NOTE — Patient Instructions (Addendum)
Delsym 2 tsp Twice daily  As needed  Cough  Chlortrimeton 4mg  1 every 4hrs as needed for drainage.  Mucinex Twice daily  As needed  Congestion .  Zpack to have on hold if symptoms worsen with discolored mucus.  Chest xray today  Fluids and rest  Follow up Dr. Melvyn Novas in 4 weeks and As needed

## 2015-03-20 NOTE — Progress Notes (Signed)
Quick Note:  Called spoke with patient, advised of cxr results/recs as stated by TP. Pt has not yet picked up the zpak but will do so as soon as she can this evening or first thing tomorrow morning. Appt scheduled with MW for 6.13.16 @ 3358, pt aware to arrive early for cxr prior. Pt aware to contact the office if her symptoms do not improve or they worsen. ______

## 2015-03-20 NOTE — Progress Notes (Signed)
Subjective:     Patient ID: Heidi Franklin, female   DOB: 10/19/1946    MRN: [id number]  Brief patient profile:  68 yobf never smoker with chronic cough  since age 69's only resolves for a week at a time then recurs daily,  dx as  post-nasal drip with nasal septal deviation, and GERD with neg MCT 11/2013       History of Present Illness  08/03/2012 f/u ov/Wert cc cough for decades, better on present rx than for many years prior to pulmonary clinic changes, not using calendar, poor hfa  Technique but no limiting sob.  rec Change zyrtec to where you take it only if needed for itching/ sneezing  Work on inhaler technique:      Ok to use symbicort with spacer if not improving   07/29/2013 f/u ov/Wert re: chronic cough since age 62  Chief Complaint  Patient presents with  . Follow-up    Breathing is some worse since the last visit. She is needing xopenex for rescue approx 2 x per wk. She c/o hoarseness and ear pain.    >>Neurontin Three times a day  rx , change b/p meds to tribenzor  08/26/2013 Follow up and Med review  Returns for follow up and med review.  We reviewed all her meds and organized them into a med calendar with pt education. Pt did not fill neurontin rx .  Can not afford Tribenzor and wants to go back to norvasc and losartan. Cough is some better . Comes and goes. Seen by ENT says laryngoscopy was benign. No records noted. Does still have PND, HA, bilateral ear congestion/popping. Using chlortrimeton At bedtime  Advised can use more often if needed.  rec No change in rx  10/26/2013 f/u ov/Wert re: chronic cough back on cozar Chief Complaint  Patient presents with  . Follow-up    Breathing and cough doing well.  Over weekend developed cough non-produtive  says never really completely free of cough, now worse, not compliant with symbicort.  >Stop losartan, Start diovan 160 /25 one daily  Change symbicort to 2 puffs every 12 hours As needed  Cough/wheeze    12/06/2013 Follow up  and Med review  Patient returns for a followup visit and medication review. We reviewed all her medications organized them into a medication calendar with patient education. Appears the patient is taking her medications correctly. Since last visit. Patient reports that she is doing better. Cough is not as bad. Last ov Symbicort was changed to As needed  Use only.  She is using on average every other day. Not sure if she needs or not.  No fever, chest pain , edema or wheezing. No xopenex use since last ov.  rec MCT > neg  12/31/2013 f/u ov/Wert re: uacs Chief Complaint  Patient presents with  . Follow-up    States breathing is doing well. C/o SOB with moderate activity and dry cough. Denies CP.    only gets a tickle in throat when out in crowds, still using symbicort qod but no change on days when uses it. Not really limited from desired activities by cough or sob rec Try off singulair to see what difference if any this makes See calendar   04/07/2014 f/u ov/Wert re: cough x decades Chief Complaint  Patient presents with  . Follow-up    Pt c/o increased cough and SOB for the past wk. Cough is non prod. She also has noticed minimal wheezing. She has been using symbicort  daily and xopenex HFA approx 2 x per wk.   No better with asthma rx  rec Add neurontin 100 tid > did not start   05/20/2014 f/u ov/Wert re: brought calendar and not using much symbicort / confusing maint vs prns Chief Complaint  Patient presents with  . Follow-up    Cough has improved since her last visit. Still c/o "tickle" in throat causing her to cough. Cough is non prod. She is using rescue inhaler once per wk on average.   never started neurontin -  Not limited by breathing from desired activities     >>Stop symbicort to see whether you need a lot more xopenex and if so restart it  Start gabapentin 100 mg four times daily Increase chlortrimeton 4 mg 2 at bedtime    06/21/2014 Follow up and Med review  Patient  returns for a followup visit and medication review. We reviewed all her medications organized them into a medication calendar with patient education. Appears the patient is not using her as needed meds for cough and drainage Did not start Gabapentin yet, wanted to check to make sure she was suppose to start. -"wanted to know what it was for"  We discussed the use for gabapentin for chronic cough along with side effects.  Says her cough is about the same, comes and goes with post nasal drainage.  rec Remember to use your Delsym for cough As needed   Remember to use Chlorphenaramine for drainage and throat clearing As needed     06/30/2014 f/u ov/Wert re:  Uacs/ neg MCT / no med calendar  Chief Complaint  Patient presents with  . Acute Visit    Pt c/o cough for the past 2 days. Cough is mainly non prod, but did have very minimal hemoptysis this am.   cough worse at hs, not clear she's really following med calendar at all. Admits never in her life cough free more than a few days. Mostly sob when coughing  rec Change neurontin to 100 mg four times a day Take the pm pepcid and 2 chlortrimeton at least an hour or two before bedtime For cough >>Take delsym two tsp every 12 hours and supplement if needed with  tramadol 50 mg up to 2 every 4 hours to suppress the urge to cough. Swallowing water or using ice chips/non mint and menthol containing candies (such as lifesavers or sugarless jolly ranchers) are also effective.  You should rest your voice and avoid activities that you know make you cough. Once you have eliminated the cough for 3 straight days try reducing the tramadol first,  then the delsym as tolerated.   Prednisone 10 mg take  4 each am x 2 days,   2 each am x 2 days,  1 each am x 2 days and stop zpak   08/23/2014 f/u ov/Wert re: chronic hoarseness / cough  Chief Complaint  Patient presents with  . Follow-up    Pt states that her cough has improved since the last visit. Her breathing  is doing well today.  She is using xopenex once every 2 wks on average and using symbicort the same.     Not following med calendar/ multiple errors. Only using neurotin 100 tid  >>Increase Neurontin Four times a day    03/20/2015 Acute OV  Complains of hoarseness, prod cough, wheezing, tightness,  bilateral ear congestion, head congestion w/ PND x4 days.   Denies any f/c/s, n/v/d, hemoptysis, edema or chest pain .  She has been followed for ongoing upper airway cough syndrome.  Last seen in Oct 2015 , says cough has waxed and waned but never went totally away  Despite aggressive GERD/AR and cough control regimen along with Neurontin.     Current Medications, Allergies, Complete Past Medical History, Past Surgical History, Family History, and Social History were reviewed in [facility] record.  ROS  The following are not active complaints unless bolded sore throat, dysphagia, dental problems, itching, sneezing,  nasal congestion or excess/ purulent secretions, ear ache,   fever, chills, sweats, unintended wt loss, pleuritic or exertional cp, hemoptysis,  orthopnea pnd or leg swelling, presyncope, palpitations, heartburn, abdominal pain, anorexia, nausea, vomiting, diarrhea  or change in bowel or urinary habits, change in stools or urine, dysuria,hematuria,  rash, arthralgias, visual complaints, headache, numbness weakness or ataxia or problems with walking or coordination,  change in mood/affect or memory.            Objective:   Physical Exam  Exam:  GEN: A/Ox3; pleasant , NAD, well nad mild hoarse amb bf nad    05/20/2014       168   >168 06/21/2014 >  06/30/2014  168 > 08/23/2014  170 >183 03/20/2015   HEENT:  [place]/AT,  EACs-clear, TMs-wnl, NOSE-clear drainage  THROAT-clear, no lesions, no postnasal drip or exudate noted.   NECK:  Supple w/ fair ROM; no JVD; normal carotid impulses w/o bruits; no thyromegaly or nodules palpated; no lymphadenopathy.  RESP  Clear to  A and P with no accessory muscle use, no dullness to percussion,    CARD:  RRR, no m/r/g  , no peripheral edema, pulses intact, no cyanosis or clubbing.  GI:   Soft & nt; nml bowel sounds; no organomegaly or masses detected.  Musco: Warm bil, no deformities or joint swelling noted.   Neuro: alert, no focal deficits noted.    Skin: Warm, no lesions or rashes         Assessment:

## 2015-03-21 ENCOUNTER — Telehealth: Payer: Self-pay | Admitting: Neurology

## 2015-03-21 NOTE — Telephone Encounter (Signed)
Pt called and stated that she was seen by her doctor this morning and informed she has early stages of pneumonia and would like to know if she can still come in for her appt tomorrow morning. Please call and advise.

## 2015-03-21 NOTE — Telephone Encounter (Signed)
I spoke to patient. Dr. Rexene Alberts and I feel that it is not safe for patient to expose herself to potential germs while sick with pneumonia. I was able to help the patient reschedule.

## 2015-03-22 ENCOUNTER — Institutional Professional Consult (permissible substitution): Payer: Medicare Other | Admitting: Neurology

## 2015-03-22 ENCOUNTER — Encounter: Payer: Self-pay | Admitting: Internal Medicine

## 2015-03-29 ENCOUNTER — Ambulatory Visit (INDEPENDENT_AMBULATORY_CARE_PROVIDER_SITE_OTHER): Payer: Medicare Other | Admitting: Neurology

## 2015-03-29 ENCOUNTER — Encounter: Payer: Self-pay | Admitting: Neurology

## 2015-03-29 VITALS — BP 132/60 | HR 88 | Resp 16 | Ht 60.0 in | Wt 181.0 lb

## 2015-03-29 DIAGNOSIS — R351 Nocturia: Secondary | ICD-10-CM

## 2015-03-29 DIAGNOSIS — E669 Obesity, unspecified: Secondary | ICD-10-CM | POA: Diagnosis not present

## 2015-03-29 DIAGNOSIS — G478 Other sleep disorders: Secondary | ICD-10-CM

## 2015-03-29 DIAGNOSIS — F515 Nightmare disorder: Secondary | ICD-10-CM

## 2015-03-29 DIAGNOSIS — G4750 Parasomnia, unspecified: Secondary | ICD-10-CM

## 2015-03-29 DIAGNOSIS — G4761 Periodic limb movement disorder: Secondary | ICD-10-CM | POA: Diagnosis not present

## 2015-03-29 DIAGNOSIS — R0683 Snoring: Secondary | ICD-10-CM | POA: Diagnosis not present

## 2015-03-29 NOTE — Progress Notes (Signed)
Subjective:    Patient ID: Heidi Franklin is a 69 y.o. female.  HPI     Star Age, MD, PhD [facility] [address] [place], [place] [address]  Dear Dr. Linna Darner,  I saw your patient, Heidi Franklin, upon your kind request in my neurologic clinic today for initial consultation of her sleep disorder, in particular, concern for parasomnias and perhaps REM behavior disorder. The patient is accompanied by her husband today. As you know, [name] is a 69 year old right-handed woman with an underlying medical history of diabetes, chronic cough followed by Dr. Melvyn Novas in pulmonology, and obesity, who reports a 4-5 month history of nightmares and anxiety at night. She has woken up remembering a bad dream. She makes moaning sounds in her sleep per husband. She does not make any flailing movements in her sleep but he has noted that she often seems to stiffen up and become jittery while making moaning sounds. She has not had any convulsive symptoms in her sleep. She doesn't endorse some restless leg symptoms and he endorses that she is very twitchy with her legs in her sleep. She has nocturia on average 3 times per night. She tries to be in better early winding down. She likes to watch TV or use her tablet while in bed. She is usually in bed between 8 and 9 PM. She may fall asleep around 10 PM. Sometimes she takes a half of Xanax at night. She no longer takes gabapentin which was prescribed for her cough. She also occasionally takes Hycodan for cough suppression but not every day. She does endorse some anxiety, stress and depressive symptoms. She has been on 50 mg of sertraline daily. She wonders if stress is a contributor to her symptoms. She does not have any evidence of sleep walking. She snores which is usually mild per husband. He has noted the occasional breathing pauses while she is asleep. She denies morning headaches. She wakes up around 9 and does not typically feel  rested first thing in the morning. Her Epworth sleepiness score is 9 out of 24 today and her fatigue score is 36 out of 63. Her sister has obstructive sleep apnea and uses a CPAP machine. She is familiar with the diagnoses as her husband also has sleep apnea and uses a CPAP machine.  Her Past Medical History Is Significant For: Past Medical History  Diagnosis Date  . BRONCHITIS, CHRONIC   . ALLERGIC RHINITIS   . Anxiety state, unspecified   . DEPRESSION   . DIABETES MELLITUS, TYPE II   . GERD   . HYPERLIPIDEMIA   . HYPERTENSION     Her Past Surgical History Is Significant For: Past Surgical History  Procedure Laterality Date  . Abdominal hysterectomy  1980  . Trigger finger release      Index finger    Her Family History Is Significant For: Family History  Problem Relation Age of Onset  . Stroke Mother   . Diabetes Father   . Hyperlipidemia Other     Parent  . Hypertension Other     Parent  . Colon cancer Neg Hx   . Esophageal cancer Neg Hx   . Rectal cancer Neg Hx   . Stomach cancer Neg Hx   . Pancreatic cancer Neg Hx     Her Social History Is Significant For: History   Social History  . Marital Status: Married    Spouse Name: N/A  . Number of Children: 0  .  Years of Education: 94yr colge   Occupational History  . Retired     Social History Main Topics  . Smoking status: Never Smoker   . Smokeless tobacco: Never Used     Comment: Married, lives with spouse-retired from [employer]  . Alcohol Use: No  . Drug Use: No  . Sexual Activity: Not on file   Other Topics Concern  . None   Social History Narrative   Moving  To smaller place     Not employed    Married    Drinks coffee occasional, Consumes 1 soda a day     Her Allergies Are:  Allergies  Allergen Reactions  . Penicillins Rash  :   Her Current Medications Are:  Outpatient Encounter Prescriptions as of 03/29/2015  Medication Sig  . ALPRAZolam (XANAX) 0.5 MG tablet Take 1 tablet by mouth  every 8 hours if needed.  Marland Kitchen amLODipine (NORVASC) 10 MG tablet Take 1 tablet (10 mg total) by mouth every morning.  Marland Kitchen aspirin 81 MG tablet Take 81 mg by mouth every morning.   . calcium carbonate (OS-CAL) 600 MG TABS Take 600 mg by mouth daily with breakfast.   . chlorpheniramine (CHLOR-TRIMETON) 4 MG tablet 1 tab by mouth at bedtime and every 4 hours as needed for drainage/drippy nose/sneezing  . dextromethorphan (DELSYM) 30 MG/5ML liquid 2 tsp every 12 hours as needed for cough  . famotidine (PEPCID) 20 MG tablet One at bedtime  . fluticasone (FLONASE) 50 MCG/ACT nasal spray Place 1 spray into both nostrils 2 (two) times daily.  Marland Kitchen glucose blood (ONE TOUCH ULTRA TEST) test strip 1 each by Other route daily. Use 1 strips to check blood sugar daily. Dx 250.00  . HYDROcodone-homatropine (HYCODAN) 5-1.5 MG/5ML syrup 1-2 tsp every 4-6 hours as needed if still coughing with the delsym  . ketoconazole (NIZORAL) 2 % cream Apply 1 application topically daily.  . Lancets (ONETOUCH ULTRASOFT) lancets PRN  . levalbuterol (XOPENEX HFA) 45 MCG/ACT inhaler Inhale 2 puffs into the lungs every 4 (four) hours as needed for wheezing or shortness of breath.  . lovastatin (MEVACOR) 20 MG tablet Take 1 tablet (20 mg total) by mouth at bedtime.  . Multiple Vitamins-Minerals (CENTRUM SILVER PO) Take 1 tablet by mouth daily.  Marland Kitchen omeprazole (PRILOSEC) 40 MG capsule TAKE 1 CAPSULE DAILY  . potassium chloride SA (K-DUR,KLOR-CON) 20 MEQ tablet TAKE 1 TABLET DAILY  . sertraline (ZOLOFT) 50 MG tablet Take 1 tablet (50 mg total) by mouth daily.  . sitaGLIPtin (JANUVIA) 100 MG tablet Take 1 tablet (100 mg total) by mouth daily.  . valsartan-hydrochlorothiazide (DIOVAN HCT) 160-25 MG per tablet Take 1 tablet by mouth daily.  Marland Kitchen gabapentin (NEURONTIN) 100 MG capsule Take 1 capsule (100 mg total) by mouth 4 (four) times daily. (Patient not taking: Reported on 03/29/2015)   No facility-administered encounter medications on file as of  03/29/2015.  :  Review of Systems:  Out of a complete 14 point review of systems, all are reviewed and negative with the exception of these symptoms as listed below:  Review of Systems  HENT: Positive for tinnitus.   Respiratory: Positive for cough, shortness of breath and wheezing.   Musculoskeletal:       Joint pain   Allergic/Immunologic: Positive for environmental allergies.  Neurological:       Reports trouble falling asleep some nights, wakes up at night to go to bathroom, panic attacks at night, vivid dreams, only reported snoring during sickness, witnessed  apnea by husband, wakes up rested, feels like she has decreased energy throughout the day, denies taking naps during day.     Objective:  Neurologic Exam  Physical Exam Physical Examination:   Filed Vitals:   03/29/15 0932  BP: 132/60  Pulse: 88  Resp: 16    General Examination: The patient is a very pleasant 69 y.o. female in no acute distress. She appears well-developed and well-nourished and well groomed. She is mildly anxious appearing.  HEENT: Normocephalic, atraumatic, pupils are equal, round and reactive to light and accommodation. Funduscopic exam is normal with sharp disc margins noted. Extraocular tracking is good without limitation to gaze excursion or nystagmus noted. Normal smooth pursuit is noted. Hearing is grossly intact. Tympanic membranes are clear bilaterally. Face is symmetric with normal facial animation and normal facial sensation. Speech is clear with no dysarthria noted. There is no hypophonia. There is no lip, neck/head, jaw or voice tremor. Neck is supple with full range of passive and active motion. There are no carotid bruits on auscultation. Oropharynx exam reveals: mild mouth dryness, good dental hygiene and moderate airway crowding, due to narrow airway entry, tonsillar size of 1-2+ bilaterally, and wider tongue. Mallampati is class II. Tongue protrudes centrally and palate elevates symmetrically.  Neck size is 15 1/8 inches. She has a Mild overbite. Nasal inspection reveals no significant nasal mucosal bogginess or redness and no septal deviation.   Chest: Clear to auscultation without wheezing, rhonchi or crackles noted.  Heart: S1+S2+0, regular and normal without murmurs, rubs or gallops noted.   Abdomen: Soft, non-tender and non-distended with normal bowel sounds appreciated on auscultation.  Extremities: There is trace pitting edema in the distal lower extremities bilaterally, only around her ankles. Pedal pulses are intact.  Skin: Warm and dry without trophic changes noted. There are no varicose veins.  Musculoskeletal: exam reveals no obvious joint deformities, tenderness or joint swelling or erythema.   Neurologically:  Mental status: The patient is awake, alert and oriented in all 4 spheres. Her immediate and remote memory, attention, language skills and fund of knowledge are appropriate. There is no evidence of aphasia, agnosia, apraxia or anomia. Speech is clear with normal prosody and enunciation. Thought process is linear. Mood is normal and affect is normal.  Cranial nerves II - XII are as described above under HEENT exam. In addition: shoulder shrug is normal with equal shoulder height noted. Motor exam: Normal bulk, strength and tone is noted. There is no drift, tremor or rebound. Romberg is negative. Reflexes are 2+ throughout. Babinski: Toes are flexor bilaterally. Fine motor skills and coordination: intact with normal finger taps, normal hand movements, normal rapid alternating patting, normal foot taps and normal foot agility.  Cerebellar testing: No dysmetria or intention tremor on finger to nose testing. Heel to shin is difficult for her, secondary to body habitus. There is no truncal or gait ataxia.  Sensory exam: intact to light touch, pinprick, vibration, temperature sense in the upper and lower extremities.  Gait, station and balance: She stands easily. No veering  to one side is noted. No leaning to one side is noted. Posture is age-appropriate and stance is narrow based. Gait shows normal stride length and normal pace. No problems turning are noted. She turns en bloc. Tandem walk is unremarkable.   Assessment and Plan:   In summary, Heidi Franklin is a very pleasant 69 y.o.-year old female with an underlying medical history of diabetes, chronic cough followed by Dr. Melvyn Novas in pulmonology, and  obesity, who reports a 4-5 month history of nightmares, moaning in her sleep, leg twitching in her sleep, snoring, witnessed breathing pauses while asleep, nocturia and anxiety at night. Her history and physical exam are concerning for nightmares disorder, PLMD, and there is also concern for underlying obstructive sleep apnea (OSA). I had a long chat with the patient and her husband  about my findings and the diagnosis of OSA and parasomnias, its prognosis and treatment options. We talked about medical treatments, surgical interventions and non-pharmacological approaches. I explained in particular the risks and ramifications of untreated moderate to severe OSA, especially with respect to developing cardiovascular disease down the Road, including congestive heart failure, difficult to treat hypertension, cardiac arrhythmias, or stroke. Even type 2 diabetes has, in part, been linked to untreated OSA. Symptoms of untreated OSA include daytime sleepiness, memory problems, mood irritability and mood disorder such as depression and anxiety, lack of energy, as well as recurrent headaches, especially morning headaches. We talked about trying to maintain a healthy lifestyle in general, as well as the importance of weight control. I encouraged the patient to eat healthy, exercise daily and keep well hydrated, to keep a scheduled bedtime and wake time routine, to not skip any meals and eat healthy snacks in between meals. I advised the patient not to drive when feeling sleepy.  She is advised  that while there symptomatic treatment for restless leg symptoms and PLMD, there is no specific treatment for parasomnias. Sometimes when we treat another organic underlying sleep disorder, parasomnias tend to improve. I recommended the following at this time: sleep study with potential positive airway pressure titration. (We will score hypopneas at 4% and split the sleep study into diagnostic and treatment portion, if the estimated. 2 hour AHI is >15/h).   I explained the sleep test procedure to the patient and also outlined possible surgical and non-surgical treatment options of OSA, including the use of a custom-made dental device (which would require a referral to a specialist dentist or oral surgeon), upper airway surgical options, such as pillar implants, radiofrequency surgery, tongue base surgery, and UPPP (which would involve a referral to an ENT surgeon). Rarely, jaw surgery such as mandibular advancement may be considered.  I also explained the CPAP treatment option to the patient, who indicated that she would be willing to try CPAP if the need arises. I explained the importance of being compliant with PAP treatment, not only for insurance purposes but primarily to improve Her symptoms, and for the patient's long term health benefit, including to reduce Her cardiovascular risks. I answered all their questions today and the patient and her husband were in agreement. I would like to see her back after the sleep study is completed and encouraged her to call with any interim questions, concerns, problems or updates.   Thank you very much for allowing me to participate in the care of this nice patient. If I can be of any further assistance to you please do not hesitate to call me at [phone].  Sincerely,   Star Age, MD, PhD  I spent 45 minutes in total face-to-face time with the patient, more than 50% of which was spent in counseling and coordination of care, reviewing test results,  reviewing medication and discussing or reviewing the diagnosis of OSA and parasomnias, the prognosis and treatment options.

## 2015-03-29 NOTE — Patient Instructions (Addendum)
Based on your symptoms and your exam I believe you are at risk for obstructive sleep apnea or OSA, and I think we should proceed with a sleep study to determine whether you do or do not have OSA and how severe it is. If you have more than mild OSA, I want you to consider treatment with CPAP. Please remember, the risks and ramifications of moderate to severe obstructive sleep apnea or OSA are: Cardiovascular disease, including congestive heart failure, stroke, difficult to control hypertension, arrhythmias, and even type 2 diabetes has been linked to untreated OSA. Sleep apnea causes disruption of sleep and sleep deprivation in most cases, which, in turn, can cause recurrent headaches, problems with memory, mood, concentration, focus, and vigilance. Most people with untreated sleep apnea report excessive daytime sleepiness, which can affect their ability to drive. Please do not drive if you feel sleepy.  I will see you back after your sleep study to go over the test results and where to go from there. We will call you after your sleep study and to set up an appointment at the time.   As discussed, there is no specific treatment for nightmare or what we call parasomnias in general.   Our sleep lab administrative assistant, Angelina Sheriff will call you to schedule your sleep study. If you don't hear back from her by next week please feel free to call her at [phone]. This is her direct line and please leave a message with your phone number to call back if you get the voicemail box.

## 2015-03-30 DIAGNOSIS — B372 Candidiasis of skin and nail: Secondary | ICD-10-CM | POA: Diagnosis not present

## 2015-04-10 ENCOUNTER — Ambulatory Visit (INDEPENDENT_AMBULATORY_CARE_PROVIDER_SITE_OTHER): Payer: Medicare Other | Admitting: Internal Medicine

## 2015-04-10 ENCOUNTER — Ambulatory Visit (INDEPENDENT_AMBULATORY_CARE_PROVIDER_SITE_OTHER)
Admission: RE | Admit: 2015-04-10 | Discharge: 2015-04-10 | Disposition: A | Discharge status: Home / Self Care | Payer: Medicare Other | Source: Ambulatory Visit | Attending: Internal Medicine | Admitting: Internal Medicine

## 2015-04-10 ENCOUNTER — Encounter: Payer: Self-pay | Admitting: Internal Medicine

## 2015-04-10 VITALS — BP 136/60 | HR 103 | Ht 60.0 in | Wt 179.6 lb

## 2015-04-10 DIAGNOSIS — R058 Other specified cough: Secondary | ICD-10-CM

## 2015-04-10 DIAGNOSIS — R05 Cough: Secondary | ICD-10-CM | POA: Diagnosis not present

## 2015-04-10 DIAGNOSIS — E669 Obesity, unspecified: Secondary | ICD-10-CM

## 2015-04-10 LAB — NITRIC OXIDE: Nitric Oxide: 12

## 2015-04-10 MED ORDER — PREDNISONE 10 MG PO TABS: ORAL_TABLET | ORAL | Status: DC

## 2015-04-10 NOTE — Progress Notes (Signed)
Quick Note:  Spoke with pt and notified of results per Dr. Wert. Pt verbalized understanding and denied any questions.  ______ 

## 2015-04-10 NOTE — Patient Instructions (Addendum)
Prednisone 10 mg take  4 each am x 2 days,   2 each am x 2 days,  1 each am x 2 days and stop   Restart gabapentin 100 mg four times a day    See [name] NP w/in 2 weeks with all your medications, even over the counter meds, separated in two separate bags, the ones you take no matter what vs the ones you stop once you feel better and take only as needed when you feel you need them.   [name]  will generate for you a new user friendly medication calendar that will put [place] all on the same page re: your medication use.     Without this process, it simply isn't possible to assure that we are providing  your outpatient care  with  the attention to detail we feel you deserve.   If we cannot assure that you're getting that kind of care,  then we cannot manage your problem effectively from this clinic.  Once you have seen [name] and we are sure that we're all on the same page with your medication use she will arrange follow up with me.  Late add:   Next step p meds verified:  add elavil 10 mg at hs and increase neurontin to 300 tid or refer to [facility]/ Dr Joya Gaskins  needs repeat tsh next ov ? Nutrition eval  For bmi > 30

## 2015-04-10 NOTE — Progress Notes (Signed)
Subjective:     Patient ID: Heidi Franklin, female   DOB: [birth year]    MRN: [id number]  Brief patient profile:  69  yobf never smoker with chronic cough  since age 69 only resolves for a week at a time then recurs daily,  dx as  post-nasal drip with nasal septal deviation, and GERD with neg MCT 11/2013      History of Present Illness  08/03/2012 f/u ov/Nhat Hearne cc cough for decades, better on present rx than for many years prior to pulmonary clinic changes, not using calendar, poor hfa  Technique but no limiting sob.  rec Change zyrtec to where you take it only if needed for itching/ sneezing  Work on inhaler technique:      Ok to use symbicort with spacer if not improving  Brief patient profile:  07/29/2013 f/u ov/Tresea Heine re: chronic cough since age 69  Chief Complaint  Patient presents with  . Follow-up    Breathing is some worse since the last visit. She is needing xopenex for rescue approx 2 x per wk. She c/o hoarseness and ear pain.    >>Neurontin Three times a day  rx , change b/p meds to tribenzor    12/06/2013 NP Follow up and Med review  Patient returns for a followup visit and medication review. We reviewed all her medications organized them into a medication calendar with patient education. Appears the patient is taking her medications correctly. Since last visit. Patient reports that she is doing better. Cough is not as bad. Last ov Symbicort was changed to As needed  Use only.  She is using on average every other day. Not sure if she needs or not.  No fever, chest pain , edema or wheezing. No xopenex use since last ov.  rec MCT > neg     04/07/2014 f/u ov/Amera Banos re: cough x decades Chief Complaint  Patient presents with  . Follow-up    Pt c/o increased cough and SOB for the past wk. Cough is non prod. She also has noticed minimal wheezing. She has been using symbicort daily and xopenex HFA approx 2 x per wk.   No better with asthma rx  rec Add neurontin 100 tid > did not  start   05/20/2014 f/u ov/Teah Votaw re: brought calendar and not using much symbicort / confusing maint vs prns Chief Complaint  Patient presents with  . Follow-up    Cough has improved since her last visit. Still c/o "tickle" in throat causing her to cough. Cough is non prod. She is using rescue inhaler once per wk on average.   never started neurontin -  Not limited by breathing from desired activities     >>Stop symbicort to see whether you need a lot more xopenex and if so restart it  Start gabapentin 100 mg four times daily Increase chlortrimeton 4 mg 2 at bedtime    06/21/2014 NP Follow up and Med review  Patient returns for a followup visit and medication review. We reviewed all her medications organized them into a medication calendar with patient education. Appears the patient is not using her as needed meds for cough and drainage Did not start Gabapentin yet, wanted to check to make sure she was suppose to start. -"wanted to know what it was for"  We discussed the use for gabapentin for chronic cough along with side effects.  Says her cough is about the same, comes and goes with post nasal drainage.  rec Remember to use your  Delsym for cough As needed   Remember to use Chlorphenaramine for drainage and throat clearing As needed     06/30/2014 f/u ov/Cordera Stineman re:  Uacs/ neg MCT / no med calendar  Chief Complaint  Patient presents with  . Acute Visit    Pt c/o cough for the past 2 days. Cough is mainly non prod, but did have very minimal hemoptysis this am.   cough worse at hs, not clear she's really following med calendar at all. Admits never in her life cough free more than a few days. Mostly sob when coughing  rec Change neurontin to 100 mg four times a day Take the pm pepcid and 2 chlortrimeton at least an hour or two before bedtime For cough >>Take delsym two tsp every 12 hours and supplement if needed with  tramadol 50 mg up to 2 every 4 hours to suppress the urge to cough.  Swallowing water or using ice chips/non mint and menthol containing candies (such as lifesavers or sugarless jolly ranchers) are also effective.  You should rest your voice and avoid activities that you know make you cough. Once you have eliminated the cough for 3 straight days try reducing the tramadol first,  then the delsym as tolerated.   Prednisone 10 mg take  4 each am x 2 days,   2 each am x 2 days,  1 each am x 2 days and stop zpak   08/23/2014 f/u ov/Keean Wilmeth re: chronic hoarseness / cough  Chief Complaint  Patient presents with  . Follow-up    Pt states that her cough has improved since the last visit. Her breathing is doing well today.  She is using xopenex once every 2 wks on average and using symbicort the same.     Not following med calendar/ multiple errors. Only using neurotin 100 tid  >>Increase Neurontin Four times a day    03/20/2015 NP  Acute OV  Complains of hoarseness, prod cough, wheezing, tightness,  bilateral ear congestion, head congestion w/ PND x4 days.   Denies any f/c/s, n/v/d, hemoptysis, edema or chest pain .  She has been followed for ongoing upper airway cough syndrome.  Last seen in Oct 2015 , says cough has waxed and waned but never went totally away  Despite aggressive GERD/AR and cough control regimen along with Neurontin.  rec Delsym 2 tsp Twice daily  As needed  Cough  Chlortrimeton 4mg  1 every 4hrs as needed for drainage.  Mucinex Twice daily  As needed  Congestion .  Zpack to have on hold if symptoms worsen with discolored mucus.  Chest xray today  Fluids and rest     04/10/2015 f/u ov/Zula Hovsepian re: chronic cough / no med calendar  Chief Complaint  Patient presents with  . Follow-up    CXR done today. Pt states her cough is some better- still prod at times with light yellow sputum. She states that she is still wheezing some and uses her albuterol inhaler 2 x per day on average.   Cough bothers her the most at hs and church and better with  chlortrimeton had improved but  no longer on neurontin then "caught cold" early May 2016 and can't shake it  Always better while on prednisone  Really only sob when coughing   No obvious day to day or daytime variabilty or assoc cp or chest tightness, subjective wheeze overt sinus or hb symptoms. No unusual exp hx or h/o childhood pna/ asthma or knowledge of premature birth.  Also denies any obvious fluctuation of symptoms with weather or environmental changes or other aggravating or alleviating factors except as outlined above   Current Medications, Allergies, Complete Past Medical History, Past Surgical History, Family History, and Social History were reviewed in [employer] record.  ROS  The following are not active complaints unless bolded sore throat, dysphagia, dental problems, itching, sneezing,  nasal congestion or excess/ purulent secretions, ear ache,   fever, chills, sweats, unintended wt loss, pleuritic or exertional cp, hemoptysis,  orthopnea pnd or leg swelling, presyncope, palpitations, abdominal pain, anorexia, nausea, vomiting, diarrhea  or change in bowel or urinary habits, change in stools or urine, dysuria,hematuria,  rash, arthralgias, visual complaints, headache, numbness weakness or ataxia or problems with walking or coordination,  change in mood/affect or memory.            Objective:   Physical Exam  Exam:  GEN: A/Ox3; pleasant , NAD, well nad severely hoarse amb bf nad  / pseudowheeze only   05/20/2014       168   >168 06/21/2014 >  06/30/2014  168 > 08/23/2014  170 >183 03/20/2015 > 04/10/2015 180   HEENT:  [place]/AT,  EACs-clear, TMs-wnl, NOSE-clear drainage  THROAT-clear, no lesions, no postnasal drip or exudate noted.   NECK:  Supple w/ fair ROM; no JVD; normal carotid impulses w/o bruits; no thyromegaly or nodules palpated; no lymphadenopathy.  RESP  Clear to A and P with no accessory muscle use, no dullness to percussion,    CARD:  RRR, no  m/r/g  , no peripheral edema, pulses intact, no cyanosis or clubbing.  GI:   Soft & nt; nml bowel sounds; no organomegaly or masses detected.  Musco: Warm bil, no deformities or joint swelling noted.   Neuro: alert, no focal deficits noted.    Skin: Warm, no lesions or rashes     CXR PA and Lateral:   04/10/2015 :     I personally reviewed images and agree with radiology impression as follows:    Mediastinum hilar structures normal. Lungs are clear. Heart size normal. No pleural effusion or pneumothorax. No acute bony abnormality.    Assessment:

## 2015-04-16 ENCOUNTER — Encounter: Payer: Self-pay | Admitting: Internal Medicine

## 2015-04-16 NOTE — Assessment & Plan Note (Signed)
Body mass index is 35.08 kg/(m^2).  Lab Results  Component Value Date   TSH 1.81 06/10/2012    Advised neg cal balance > needs repeat tsh next ov ? Nutrition eval

## 2015-04-16 NOTE — Assessment & Plan Note (Signed)
CT sinus 11/08/11>>Negative paranasal sinuses.  Rightward nasal septal deviation and possible rhinitis. 01/13/12>>  RAST negative, IgE 22.7 PFT 11/07/11>>FEV1 1.79 (102%),  Ratio 82, TLC 4.18 (104%), DLCO 69%, positive BD only in terms of fef 25/75 -med calendar 11/23/2012 , 03/18/2013 , 08/26/2013 , 12/06/2013, 06/21/2014 > not keeping up with it  -methacholine challenge test  12/24/13 > neg  - try off singulair 12/31/2013 > no worse  - added neurontin 100 mg qid  05/20/14 but did not take consistently as of 06/30/2014 > increased to 100 qid 08/23/2014 improved then stopped it and flared spirng of 2016   I had an extended discussion with the patient reviewing all relevant studies completed to date and  lasting 15 to 20 minutes of a 25 minute visit on the following ongoing concerns: 1) many pts with uacs report transient improvement on pred and gerd rx due to cyclical nature of cough > inflammation  From [place] from coughing And more coughing from  gerd > more GERD etc   2) side effects of reflux apparent Body mass index is 35.08 kg/(m^2). > see obesity  3) in this context it would make more sense to find the lowest dose of neurontin and stick with it.  4) desperately needs med reconciliation.  To keep things simple, I have asked the patient to first separate medicines that are perceived as maintenance, that is to be taken daily "no matter what", from those medicines that are taken on only on an as-needed basis and I have given the patient examples of both, and then return to see our NP to generate a  detailed  medication calendar which should be followed until the next physician sees the patient and updates it.   5) Each maintenance medication was reviewed in detail including most importantly the difference between maintenance and as needed and under what circumstances the prns are to be used.  Please see instructions for details which were reviewed in writing and the patient given a copy.

## 2015-04-17 ENCOUNTER — Ambulatory Visit: Payer: Medicare Other | Admitting: Internal Medicine

## 2015-04-25 ENCOUNTER — Other Ambulatory Visit: Payer: Self-pay | Admitting: Internal Medicine

## 2015-05-02 ENCOUNTER — Ambulatory Visit (INDEPENDENT_AMBULATORY_CARE_PROVIDER_SITE_OTHER): Payer: Medicare Other | Admitting: Adult Health

## 2015-05-02 ENCOUNTER — Encounter: Payer: Self-pay | Admitting: Adult Health

## 2015-05-02 ENCOUNTER — Other Ambulatory Visit (INDEPENDENT_AMBULATORY_CARE_PROVIDER_SITE_OTHER): Payer: Medicare Other

## 2015-05-02 VITALS — BP 128/66 | HR 103 | Temp 98.3°F | Ht 60.0 in | Wt 181.0 lb

## 2015-05-02 DIAGNOSIS — R058 Other specified cough: Secondary | ICD-10-CM

## 2015-05-02 DIAGNOSIS — I10 Essential (primary) hypertension: Secondary | ICD-10-CM | POA: Diagnosis not present

## 2015-05-02 DIAGNOSIS — E669 Obesity, unspecified: Secondary | ICD-10-CM | POA: Diagnosis not present

## 2015-05-02 DIAGNOSIS — R05 Cough: Secondary | ICD-10-CM | POA: Diagnosis not present

## 2015-05-02 LAB — TSH: TSH: 2.26 u[IU]/mL (ref 0.35–4.50)

## 2015-05-02 NOTE — Progress Notes (Signed)
Subjective:     Patient ID: Heidi Franklin, female   DOB: 1946-08-15    MRN: [id number]  Brief patient profile:  69  yobf never smoker with chronic cough  since age 69's only resolves for a week at a time then recurs daily,  dx as  post-nasal drip with nasal septal deviation, and GERD with neg MCT 11/2013      History of Present Illness  08/03/2012 f/u ov/Wert cc cough for decades, better on present rx than for many years prior to pulmonary clinic changes, not using calendar, poor hfa  Technique but no limiting sob.  rec Change zyrtec to where you take it only if needed for itching/ sneezing  Work on inhaler technique:      Ok to use symbicort with spacer if not improving  Brief patient profile:  07/29/2013 f/u ov/Wert re: chronic cough since age 69  Chief Complaint  Patient presents with  . Follow-up    Breathing is some worse since the last visit. She is needing xopenex for rescue approx 2 x per wk. She c/o hoarseness and ear pain.    >>Neurontin Three times a day  rx , change b/p meds to tribenzor    12/06/2013 NP Follow up and Med review  Patient returns for a followup visit and medication review. We reviewed all her medications organized them into a medication calendar with patient education. Appears the patient is taking her medications correctly. Since last visit. Patient reports that she is doing better. Cough is not as bad. Last ov Symbicort was changed to As needed  Use only.  She is using on average every other day. Not sure if she needs or not.  No fever, chest pain , edema or wheezing. No xopenex use since last ov.  rec MCT > neg     04/07/2014 f/u ov/Wert re: cough x decades Chief Complaint  Patient presents with  . Follow-up    Pt c/o increased cough and SOB for the past wk. Cough is non prod. She also has noticed minimal wheezing. She has been using symbicort daily and xopenex HFA approx 2 x per wk.   No better with asthma rx  rec Add neurontin 100 tid > did not  start   05/20/2014 f/u ov/Wert re: brought calendar and not using much symbicort / confusing maint vs prns Chief Complaint  Patient presents with  . Follow-up    Cough has improved since her last visit. Still c/o "tickle" in throat causing her to cough. Cough is non prod. She is using rescue inhaler once per wk on average.   never started neurontin -  Not limited by breathing from desired activities     >>Stop symbicort to see whether you need a lot more xopenex and if so restart it  Start gabapentin 100 mg four times daily Increase chlortrimeton 4 mg 2 at bedtime    06/21/2014 NP Follow up and Med review  Patient returns for a followup visit and medication review. We reviewed all her medications organized them into a medication calendar with patient education. Appears the patient is not using her as needed meds for cough and drainage Did not start Gabapentin yet, wanted to check to make sure she was suppose to start. -"wanted to know what it was for"  We discussed the use for gabapentin for chronic cough along with side effects.  Says her cough is about the same, comes and goes with post nasal drainage.  rec Remember to use your  Delsym for cough As needed   Remember to use Chlorphenaramine for drainage and throat clearing As needed     06/30/2014 f/u ov/Wert re:  Uacs/ neg MCT / no med calendar  Chief Complaint  Patient presents with  . Acute Visit    Pt c/o cough for the past 2 days. Cough is mainly non prod, but did have very minimal hemoptysis this am.   cough worse at hs, not clear she's really following med calendar at all. Admits never in her life cough free more than a few days. Mostly sob when coughing  rec Change neurontin to 100 mg four times a day Take the pm pepcid and 2 chlortrimeton at least an hour or two before bedtime For cough >>Take delsym two tsp every 12 hours and supplement if needed with  tramadol 50 mg up to 2 every 4 hours to suppress the urge to cough.  Swallowing water or using ice chips/non mint and menthol containing candies (such as lifesavers or sugarless jolly ranchers) are also effective.  You should rest your voice and avoid activities that you know make you cough. Once you have eliminated the cough for 3 straight days try reducing the tramadol first,  then the delsym as tolerated.   Prednisone 10 mg take  4 each am x 2 days,   2 each am x 2 days,  1 each am x 2 days and stop zpak   08/23/2014 f/u ov/Wert re: chronic hoarseness / cough  Chief Complaint  Patient presents with  . Follow-up    Pt states that her cough has improved since the last visit. Her breathing is doing well today.  She is using xopenex once every 2 wks on average and using symbicort the same.     Not following med calendar/ multiple errors. Only using neurotin 100 tid  >>Increase Neurontin Four times a day    03/20/2015 NP  Acute OV  Complains of hoarseness, prod cough, wheezing, tightness,  bilateral ear congestion, head congestion w/ PND x4 days.   Denies any f/c/s, n/v/d, hemoptysis, edema or chest pain .  She has been followed for ongoing upper airway cough syndrome.  Last seen in Oct 2015 , says cough has waxed and waned but never went totally away  Despite aggressive GERD/AR and cough control regimen along with Neurontin.  rec Delsym 2 tsp Twice daily  As needed  Cough  Chlortrimeton 4mg  1 every 4hrs as needed for drainage.  Mucinex Twice daily  As needed  Congestion .  Zpack to have on hold if symptoms worsen with discolored mucus.  Chest xray today  Fluids and rest     04/10/2015 f/u ov/Wert re: chronic cough / no med calendar  Chief Complaint  Patient presents with  . Follow-up    CXR done today. Pt states her cough is some better- still prod at times with light yellow sputum. She states that she is still wheezing some and uses her albuterol inhaler 2 x per day on average.   Cough bothers her the most at hs and church and better with  chlortrimeton had improved but  no longer on neurontin then "caught cold" early May 2016 and can't shake it  Always better while on prednisone  Really only sob when coughing  >>increased gabapentin Four times a day  And pred taper   05/02/2015 Follow up and Med Review : Chronic cough  Pt returns for 2 week follow up and med review  We reviewed all  her meds and organized them into a med calendar with pt education  Gabapentin was increased last ov to Four times a day  -she has not started the new dose yet.  Also given prednisone taper which has helped while she was on the taper.  Cough and hoarseness some better but still wax and wane.  Has hoarseness on/off . Seen by ENT with layryngoscopy in past few years ago.  We discussed cough control measures .  cXR last ov with no acute process.  She denies chest pain , orthopnea, edema , fever or discolored mucus.       Current Medications, Allergies, Complete Past Medical History, Past Surgical History, Family History, and Social History were reviewed in [employer] record.  ROS  The following are not active complaints unless bolded sore throat, dysphagia, dental problems, itching, sneezing,  purulent secretions, ear ache,   fever, chills, sweats, unintended wt loss, pleuritic or exertional cp, hemoptysis,  orthopnea pnd or leg swelling, presyncope, palpitations, abdominal pain, anorexia, nausea, vomiting, diarrhea  or change in bowel or urinary habits, change in stools or urine, dysuria,hematuria,  rash, arthralgias, visual complaints, headache, numbness weakness or ataxia or problems with walking or coordination,  change in mood/affect or memory.            Objective:   Physical Exam  Exam:  GEN: A/Ox3; pleasant , NAD, well nad   amb bf nad    05/20/2014       168   >168 06/21/2014 >  06/30/2014  168 > 08/23/2014  170 >183 03/20/2015 > 04/10/2015 180 >181 05/02/2015   HEENT:  [place]/AT,  EACs-clear, TMs-wnl, NOSE-clear drainage   THROAT-clear, no lesions, no postnasal drip or exudate noted.   NECK:  Supple w/ fair ROM; no JVD; normal carotid impulses w/o bruits; no thyromegaly or nodules palpated; no lymphadenopathy.  RESP  Clear to A and P with no accessory muscle use, no dullness to percussion,    CARD:  RRR, no m/r/g  , no peripheral edema, pulses intact, no cyanosis or clubbing.  GI:   Soft & nt; nml bowel sounds; no organomegaly or masses detected.  Musco: Warm bil, no deformities or joint swelling noted.   Neuro: alert, no focal deficits noted.    Skin: Warm, no lesions or rashes     CXR PA and Lateral:   04/10/2015 :     I personally reviewed images and agree with radiology impression as follows:    Mediastinum hilar structures normal. Lungs are clear. Heart size normal. No pleural effusion or pneumothorax. No acute bony abnormality.    Assessment:

## 2015-05-02 NOTE — Patient Instructions (Signed)
Increase Gabapentin Four times a day  As previously recommended Labs today .  Follow med calendar closely and bring to each visit.  Follow up Dr. Melvyn Novas  In 2 months and As needed

## 2015-05-02 NOTE — Assessment & Plan Note (Signed)
encouarged on wt loss Steroid discussion /pt education  Check tsh  Consider nutrition referral in future

## 2015-05-02 NOTE — Assessment & Plan Note (Signed)
Difficult to control cough with suspected GERD/AR triggers  Workup has been unrevealing thus far with CT sinus, IgE, RAST , nml PFT , neg MCT  No improvement with singulair .  Marginal improvement with neurontin , will maximize dose , if no improvement consider referral to [facility] for evaluation in voice center-Dr. Joya Gaskins.   Plan  ncrease Gabapentin Four times a day  As previously recommended Labs today .  Follow med calendar closely and bring to each visit.  Follow up Dr. Melvyn Novas  In 2 months and As needed

## 2015-05-02 NOTE — Progress Notes (Signed)
Chart and office note reviewed in detail  > agree with a/p as outlined    

## 2015-05-03 NOTE — Progress Notes (Signed)
Quick Note:  Called and spoke with pt. Reviewed results and recs. Pt voiced understanding and had no further questions. ______ 

## 2015-05-04 NOTE — Addendum Note (Signed)
Addended by: Osa Craver on: 05/04/2015 01:46 PM   Modules accepted: Orders, Medications

## 2015-05-09 ENCOUNTER — Other Ambulatory Visit: Payer: Self-pay | Admitting: Internal Medicine

## 2015-06-12 ENCOUNTER — Ambulatory Visit: Payer: Medicare Other | Admitting: Podiatry

## 2015-06-15 ENCOUNTER — Ambulatory Visit (INDEPENDENT_AMBULATORY_CARE_PROVIDER_SITE_OTHER)
Admission: RE | Admit: 2015-06-15 | Discharge: 2015-06-15 | Disposition: A | Discharge status: Home / Self Care | Payer: Medicare Other | Source: Ambulatory Visit | Attending: Internal Medicine | Admitting: Internal Medicine

## 2015-06-15 ENCOUNTER — Other Ambulatory Visit (INDEPENDENT_AMBULATORY_CARE_PROVIDER_SITE_OTHER): Payer: Medicare Other

## 2015-06-15 ENCOUNTER — Ambulatory Visit (INDEPENDENT_AMBULATORY_CARE_PROVIDER_SITE_OTHER): Payer: Medicare Other | Admitting: Internal Medicine

## 2015-06-15 ENCOUNTER — Encounter: Payer: Self-pay | Admitting: Internal Medicine

## 2015-06-15 VITALS — BP 118/60 | HR 92 | Temp 97.7°F | Ht 60.0 in | Wt 175.2 lb

## 2015-06-15 DIAGNOSIS — F418 Other specified anxiety disorders: Secondary | ICD-10-CM | POA: Diagnosis not present

## 2015-06-15 DIAGNOSIS — E1165 Type 2 diabetes mellitus with hyperglycemia: Secondary | ICD-10-CM

## 2015-06-15 DIAGNOSIS — E1140 Type 2 diabetes mellitus with diabetic neuropathy, unspecified: Secondary | ICD-10-CM | POA: Diagnosis not present

## 2015-06-15 DIAGNOSIS — Z23 Encounter for immunization: Secondary | ICD-10-CM | POA: Diagnosis not present

## 2015-06-15 DIAGNOSIS — Z0000 Encounter for general adult medical examination without abnormal findings: Secondary | ICD-10-CM | POA: Diagnosis not present

## 2015-06-15 DIAGNOSIS — M7552 Bursitis of left shoulder: Secondary | ICD-10-CM

## 2015-06-15 DIAGNOSIS — S4992XA Unspecified injury of left shoulder and upper arm, initial encounter: Secondary | ICD-10-CM | POA: Diagnosis not present

## 2015-06-15 DIAGNOSIS — IMO0002 Reserved for concepts with insufficient information to code with codable children: Secondary | ICD-10-CM

## 2015-06-15 DIAGNOSIS — M25512 Pain in left shoulder: Secondary | ICD-10-CM | POA: Diagnosis not present

## 2015-06-15 LAB — LIPID PANEL
Cholesterol: 167 mg/dL (ref 0–200)
HDL: 31.4 mg/dL — ABNORMAL LOW (ref >39.00)
LDL Cholesterol: 98 mg/dL (ref 0–99)
NonHDL: 135.41
Total CHOL/HDL Ratio: 5
Triglycerides: 185 mg/dL — ABNORMAL HIGH (ref 0.0–149.0)
VLDL: 37 mg/dL (ref 0.0–40.0)

## 2015-06-15 LAB — BASIC METABOLIC PANEL
BUN: 16 mg/dL (ref 6–23)
CO2: 30 mEq/L (ref 19–32)
Calcium: 9.6 mg/dL (ref 8.4–10.5)
Chloride: 102 mEq/L (ref 96–112)
Creatinine, Ser: 0.79 mg/dL (ref 0.40–1.20)
GFR: 92.46 mL/min (ref >60.00)
Glucose, Bld: 107 mg/dL — ABNORMAL HIGH (ref 70–99)
Potassium: 3.5 mEq/L (ref 3.5–5.1)
Sodium: 141 mEq/L (ref 135–145)

## 2015-06-15 LAB — MICROALBUMIN / CREATININE URINE RATIO
Creatinine,U: 184.9 mg/dL
Microalb Creat Ratio: 0.5 mg/g (ref 0.0–30.0)
Microalb, Ur: 0.9 mg/dL (ref 0.0–1.9)

## 2015-06-15 LAB — HEMOGLOBIN A1C: Hgb A1c MFr Bld: 6.8 % — ABNORMAL HIGH (ref 4.6–6.5)

## 2015-06-15 MED ORDER — VALSARTAN-HYDROCHLOROTHIAZIDE 160-25 MG PO TABS: 1.0000 | ORAL_TABLET | ORAL | Status: DC

## 2015-06-15 MED ORDER — SERTRALINE HCL 100 MG PO TABS: 100.0000 mg | ORAL_TABLET | ORAL | Status: DC

## 2015-06-15 MED ORDER — TRAMADOL HCL 50 MG PO TABS: 50.0000 mg | ORAL_TABLET | Freq: Four times a day (QID) | ORAL | Status: DC | PRN

## 2015-06-15 MED ORDER — MELOXICAM 15 MG PO TABS: 15.0000 mg | ORAL_TABLET | Freq: Every day | ORAL | Status: DC

## 2015-06-15 MED ORDER — POTASSIUM CHLORIDE CRYS ER 20 MEQ PO TBCR: 20.0000 meq | EXTENDED_RELEASE_TABLET | ORAL | Status: DC

## 2015-06-15 MED ORDER — FAMOTIDINE 20 MG PO TABS: 20.0000 mg | ORAL_TABLET | Freq: Every day | ORAL | Status: DC

## 2015-06-15 MED ORDER — ALPRAZOLAM 0.5 MG PO TABS: ORAL_TABLET | ORAL | Status: DC

## 2015-06-15 NOTE — Assessment & Plan Note (Signed)
Started sertraline SSRI 05/2011, dose increased 08/2011  increase symptoms in recent month so will increase to 100mg /day now - erx done ok to continue prn BZ for symptoms  -

## 2015-06-15 NOTE — Patient Instructions (Addendum)
It was good to see you today.  We have reviewed your prior records including labs and tests today  Health Maintenance reviewed - Prevnar (pneumonia vaccine)given today - all other recommended immunizations and age-appropriate screenings are up-to-date.  Test(s) ordered today - labs and xray. Your results will be released to [employer] (or called to you) after review, usually within 72hours after test completion. If any changes need to be made, you will be notified at that same time.  Medications reviewed and updated Use meloxicam every day for 2 weeks for shoulder pain - if not better, or if worse, see Dr Tamala Julian for injection as discussed No other changes recommended at this time.  Please schedule followup in 31month for diabetes mellitus exam and labs, call sooner if problems.  Health Maintenance Adopting a healthy lifestyle and getting preventive care can go a long way to promote health and wellness. Talk with your health care provider about what schedule of regular examinations is right for you. This is a good chance for you to check in with your provider about disease prevention and staying healthy. In between checkups, there are plenty of things you can do on your own. Experts have done a lot of research about which lifestyle changes and preventive measures are most likely to keep you healthy. Ask your health care provider for more information. WEIGHT AND DIET  Eat a healthy diet  Be sure to include plenty of vegetables, fruits, low-fat dairy products, and lean protein.  Do not eat a lot of foods high in solid fats, added sugars, or salt.  Get regular exercise. This is one of the most important things you can do for your health.  Most adults should exercise for at least 150 minutes each week. The exercise should increase your heart rate and make you sweat (moderate-intensity exercise).  Most adults should also do strengthening exercises at least twice a week. This is in addition to the  moderate-intensity exercise.  Maintain a healthy weight  Body mass index (BMI) is a measurement that can be used to identify possible weight problems. It estimates body fat based on height and weight. Your health care provider can help determine your BMI and help you achieve or maintain a healthy weight.  For females 294years of age and older:   A BMI below 18.5 is considered underweight.  A BMI of 18.5 to 24.9 is normal.  A BMI of 25 to 29.9 is considered overweight.  A BMI of 30 and above is considered obese.  Watch levels of cholesterol and blood lipids  You should start having your blood tested for lipids and cholesterol at 70years of age, then have this test every 5 years.  You may need to have your cholesterol levels checked more often if:  Your lipid or cholesterol levels are high.  You are older than 70years of age.  You are at high risk for heart disease.  CANCER SCREENING   Lung Cancer  Lung cancer screening is recommended for adults [id number] old who are at high risk for lung cancer because of a history of smoking.  A yearly low-dose CT scan of the lungs is recommended for people who:  Currently smoke.  Have quit within the past 15 years.  Have at least a 30-pack-year history of smoking. A pack year is smoking an average of one pack of cigarettes a day for 1 year.  Yearly screening should continue until it has been 15 years since you quit.  Yearly screening should stop if you develop a health problem that would prevent you from having lung cancer treatment.  Breast Cancer  Practice breast self-awareness. This means understanding how your breasts normally appear and feel.  It also means doing regular breast self-exams. Let your health care provider know about any changes, no matter how small.  If you are in your 20s or 30s, you should have a clinical breast exam (CBE) by a health care provider every 1-3 years as part of a regular health exam.  If  you are 64 or older, have a CBE every year. Also consider having a breast X-ray (mammogram) every year.  If you have a family history of breast cancer, talk to your health care provider about genetic screening.  If you are at high risk for breast cancer, talk to your health care provider about having an MRI and a mammogram every year.  Breast cancer gene (BRCA) assessment is recommended for women who have family members with BRCA-related cancers. BRCA-related cancers include:  Breast.  Ovarian.  Tubal.  Peritoneal cancers.  Results of the assessment will determine the need for genetic counseling and BRCA1 and BRCA2 testing. Cervical Cancer Routine pelvic examinations to screen for cervical cancer are no longer recommended for nonpregnant women who are considered low risk for cancer of the pelvic organs (ovaries, uterus, and vagina) and who do not have symptoms. A pelvic examination may be necessary if you have symptoms including those associated with pelvic infections. Ask your health care provider if a screening pelvic exam is right for you.   The Pap test is the screening test for cervical cancer for women who are considered at risk.  If you had a hysterectomy for a problem that was not cancer or a condition that could lead to cancer, then you no longer need Pap tests.  If you are older than 65 years, and you have had normal Pap tests for the past 10 years, you no longer need to have Pap tests.  If you have had past treatment for cervical cancer or a condition that could lead to cancer, you need Pap tests and screening for cancer for at least 20 years after your treatment.  If you no longer get a Pap test, assess your risk factors if they change (such as having a new sexual partner). This can affect whether you should start being screened again.  Some women have medical problems that increase their chance of getting cervical cancer. If this is the case for you, your health care  provider may recommend more frequent screening and Pap tests.  The human papillomavirus (HPV) test is another test that may be used for cervical cancer screening. The HPV test looks for the virus that can cause cell changes in the cervix. The cells collected during the Pap test can be tested for HPV.  The HPV test can be used to screen women 58 years of age and older. Getting tested for HPV can extend the interval between normal Pap tests from three to five years.  An HPV test also should be used to screen women of any age who have unclear Pap test results.  After 70 years of age, women should have HPV testing as often as Pap tests.  Colorectal Cancer  This type of cancer can be detected and often prevented.  Routine colorectal cancer screening usually begins at 70 years of age and continues through 70 years of age.  Your health care provider may recommend screening at  an earlier age if you have risk factors for colon cancer.  Your health care provider may also recommend using home test kits to check for hidden blood in the stool.  A small camera at the end of a tube can be used to examine your colon directly (sigmoidoscopy or colonoscopy). This is done to check for the earliest forms of colorectal cancer.  Routine screening usually begins at age 75.  Direct examination of the colon should be repeated every 5-10 years through 70 years of age. However, you may need to be screened more often if early forms of precancerous polyps or small growths are found. Skin Cancer  Check your skin from head to toe regularly.  Tell your health care provider about any new moles or changes in moles, especially if there is a change in a mole's shape or color.  Also tell your health care provider if you have a mole that is larger than the size of a pencil eraser.  Always use sunscreen. Apply sunscreen liberally and repeatedly throughout the day.  Protect yourself by wearing long sleeves, pants, a  wide-brimmed hat, and sunglasses whenever you are outside. HEART DISEASE, DIABETES, AND HIGH BLOOD PRESSURE   Have your blood pressure checked at least every 1-2 years. High blood pressure causes heart disease and increases the risk of stroke.  If you are between 24 years and 16 years old, ask your health care provider if you should take aspirin to prevent strokes.  Have regular diabetes screenings. This involves taking a blood sample to check your fasting blood sugar level.  If you are at a normal weight and have a low risk for diabetes, have this test once every three years after 70 years of age.  If you are overweight and have a high risk for diabetes, consider being tested at a younger age or more often. PREVENTING INFECTION  Hepatitis B  If you have a higher risk for hepatitis B, you should be screened for this virus. You are considered at high risk for hepatitis B if:  You were born in a country where hepatitis B is common. Ask your health care provider which countries are considered high risk.  Your parents were born in a high-risk country, and you have not been immunized against hepatitis B (hepatitis B vaccine).  You have HIV or AIDS.  You use needles to inject street drugs.  You live with someone who has hepatitis B.  You have had sex with someone who has hepatitis B.  You get hemodialysis treatment.  You take certain medicines for conditions, including cancer, organ transplantation, and autoimmune conditions. Hepatitis C  Blood testing is recommended for:  Everyone born from [birth year] through 1966.  Anyone with known risk factors for hepatitis C. Sexually transmitted infections (STIs)  You should be screened for sexually transmitted infections (STIs) including gonorrhea and chlamydia if:  You are sexually active and are younger than 70 years of age.  You are older than 70 years of age and your health care provider tells you that you are at risk for this type of  infection.  Your sexual activity has changed since you were last screened and you are at an increased risk for chlamydia or gonorrhea. Ask your health care provider if you are at risk.  If you do not have HIV, but are at risk, it may be recommended that you take a prescription medicine daily to prevent HIV infection. This is called pre-exposure prophylaxis (PrEP). You are considered at  risk if:  You are sexually active and do not regularly use condoms or know the HIV status of your partner(s).  You take drugs by injection.  You are sexually active with a partner who has HIV. Talk with your health care provider about whether you are at high risk of being infected with HIV. If you choose to begin PrEP, you should first be tested for HIV. You should then be tested every 3 months for as long as you are taking PrEP.  PREGNANCY   If you are premenopausal and you may become pregnant, ask your health care provider about preconception counseling.  If you may become pregnant, take 400 to 800 micrograms (mcg) of folic acid every day.  If you want to prevent pregnancy, talk to your health care provider about birth control (contraception). OSTEOPOROSIS AND MENOPAUSE   Osteoporosis is a disease in which the bones lose minerals and strength with aging. This can result in serious bone fractures. Your risk for osteoporosis can be identified using a bone density scan.  If you are 50 years of age or older, or if you are at risk for osteoporosis and fractures, ask your health care provider if you should be screened.  Ask your health care provider whether you should take a calcium or vitamin D supplement to lower your risk for osteoporosis.  Menopause may have certain physical symptoms and risks.  Hormone replacement therapy may reduce some of these symptoms and risks. Talk to your health care provider about whether hormone replacement therapy is right for you.  HOME CARE INSTRUCTIONS   Schedule regular  health, dental, and eye exams.  Stay current with your immunizations.   Do not use any tobacco products including cigarettes, chewing tobacco, or electronic cigarettes.  If you are pregnant, do not drink alcohol.  If you are breastfeeding, limit how much and how often you drink alcohol.  Limit alcohol intake to no more than 1 drink per day for nonpregnant women. One drink equals 12 ounces of beer, 5 ounces of wine, or 1 ounces of hard liquor.  Do not use street drugs.  Do not share needles.  Ask your health care provider for help if you need support or information about quitting drugs.  Tell your health care provider if you often feel depressed.  Tell your health care provider if you have ever been abused or do not feel safe at home. Document Released: 04/29/2011 Document Revised: 02/28/2014 Document Reviewed: 09/15/2013 [facility] Patient Information 2015 [place], [place]. This information is not intended to replace advice given to you by your health care provider. Make sure you discuss any questions you have with your health care provider.

## 2015-06-15 NOTE — Progress Notes (Signed)
Subjective:    Patient ID: Heidi Franklin, female    DOB: [birth year], 69 y.o.   MRN: [id number]  HPI  Here for medicare wellness  Diet: heart healthy, diabetic Physical activity: sedentary Depression/mood screen: negative Hearing: intact to whispered voice Visual acuity: grossly normal, performs annual eye exam  ADLs: capable Fall risk: none Home safety: good Cognitive evaluation: intact to orientation, naming, recall and repetition EOL planning: adv directives, full code/ I agree  I have personally reviewed and have noted 1. The patient's medical and social history 2. Their use of alcohol, tobacco or illicit drugs 3. Their current medications and supplements 4. The patient's functional ability including ADL's, fall risks, home safety risks and hearing or visual impairment. 5. Diet and physical activities 6. Evidence for depression or mood disorders  also reviewed chronic medical issues, interval events and current concerns  Past Medical History  Diagnosis Date  . BRONCHITIS, CHRONIC   . ALLERGIC RHINITIS   . Anxiety state, unspecified   . DEPRESSION   . DIABETES MELLITUS, TYPE II   . GERD   . HYPERLIPIDEMIA   . HYPERTENSION    Family History  Problem Relation Age of Onset  . Stroke Mother   . Diabetes Father   . Hyperlipidemia Other     Parent  . Hypertension Other     Parent  . Colon cancer Neg Hx   . Esophageal cancer Neg Hx   . Rectal cancer Neg Hx   . Stomach cancer Neg Hx   . Pancreatic cancer Neg Hx    Social History  Substance Use Topics  . Smoking status: Never Smoker   . Smokeless tobacco: Never Used  . Alcohol Use: No    Review of Systems  Constitutional: Negative for fatigue and unexpected weight change.  Respiratory: Negative for cough, shortness of breath and wheezing.   Cardiovascular: Negative for chest pain, palpitations and leg swelling.  Gastrointestinal: Negative for nausea, abdominal pain and diarrhea.  Musculoskeletal: Positive  for arthralgias (L shoulder, esp lying on L side at night). Negative for myalgias.  Neurological: Negative for dizziness, weakness, light-headedness and headaches.  Psychiatric/Behavioral: Negative for dysphoric mood. The patient is not nervous/anxious.   All other systems reviewed and are negative.   Patient Care Team: Rowe Clack, MD as PCP - General Chesley Mires, MD (Pulmonary Disease) Gean Birchwood, DPM (Podiatry) Almedia Balls, MD (Orthopedic Surgery) Irene Shipper, MD (Gastroenterology) Star Age, MD (Neurology)     Objective:    Physical Exam  Constitutional: She appears well-developed and well-nourished. No distress.  obese  Cardiovascular: Normal rate, regular rhythm and normal heart sounds.   No murmur heard. Pulmonary/Chest: Effort normal and breath sounds normal. No respiratory distress.  Musculoskeletal: She exhibits no edema.  L Shoulder: Full range of motion. Neurovascularly intact distally. Good strength with stress of rotator cuff but causes pain. Positive impingement signs.  Vitals reviewed.   BP 118/60 mmHg  Pulse 92  Temp(Src) 97.7 F (36.5 C) (Oral)  Ht 5' (1.524 m)  Wt 175 lb 4 oz (79.493 kg)  BMI 34.23 kg/m2  SpO2 99% Wt Readings from Last 3 Encounters:  06/15/15 175 lb 4 oz (79.493 kg)  05/02/15 181 lb (82.101 kg)  04/10/15 179 lb 9.6 oz (81.466 kg)    Lab Results  Component Value Date   WBC 10.9* 06/10/2012   HGB 12.2 06/10/2012   HCT 38.5 06/10/2012   PLT 272.0 06/10/2012   GLUCOSE 106* 08/04/2014   CHOL  151 08/04/2014   TRIG 176.0* 08/04/2014   HDL 26.50* 08/04/2014   LDLDIRECT 155.1 02/09/2013   LDLCALC 89 08/04/2014   ALT 27 06/23/2013   AST 31 06/23/2013   NA 139 08/04/2014   K 3.5 08/04/2014   CL 101 08/04/2014   CREATININE 0.8 08/04/2014   BUN 14 08/04/2014   CO2 33* 08/04/2014   TSH 2.26 05/02/2015   HGBA1C 6.2 02/07/2015   MICROALBUR 1.2 11/23/2013    Dg Chest 2 View  04/10/2015   CLINICAL DATA:  Cough.   EXAM: CHEST  2 VIEW  COMPARISON:  None.  FINDINGS: Mediastinum hilar structures normal. Lungs are clear. Heart size normal. No pleural effusion or pneumothorax. No acute bony abnormality.  IMPRESSION: No acute cardiopulmonary disease.   Electronically Signed   By: Marcello Moores  Register   On: 04/10/2015 09:38       Assessment & Plan:   [id number] - Today patient counseled on age appropriate routine health concerns for screening and prevention, each reviewed and up to date or declined. Immunizations reviewed and up to date or declined. Labs ordered and reviewed. Risk factors for depression reviewed and negative. Hearing function and visual acuity are intact. ADLs screened and addressed as needed. Functional ability and level of safety reviewed and appropriate. Education, counseling and referrals performed based on assessed risks today. Patient provided with a copy of personalized plan for preventive services.  L shoulder bursitis - likely exacerbated fall weeks ago. Will check plain film and rx NSAID (meloxicam) - exercises discussed - to follow up sport med in 2 weeks for injection if unimproved -  Problem List Items Addressed This Visit    Depression with anxiety    Started sertraline SSRI 05/2011, dose increased 08/2011  increase symptoms in recent month so will increase to 100mg /day now - erx done ok to continue prn BZ for symptoms  -       Type 2 diabetes, uncontrolled, with neuropathy    On solo metformin - but does not like GI side effects Changed to Actos 07/2014 but stopped due to concern for wt gain side effects rx'd Januvia in 01/2015 but unable to take same because of cost Continue statin, ARB, ASA 81 -  reports home cbgs "ok" -140s Check a1c now, adjust further if needed Lab Results  Component Value Date   HGBA1C 6.2 02/07/2015        Relevant Medications   valsartan-hydrochlorothiazide (DIOVAN HCT) 160-25 MG per tablet   Other Relevant Orders   Hemoglobin A1c   Lipid panel    Microalbumin / creatinine urine ratio   Basic metabolic panel    Other Visit Diagnoses    Routine general medical examination at a health care facility    -  Primary    Bursitis of left shoulder        Relevant Orders    DG Shoulder Left        [name], MD

## 2015-06-15 NOTE — Assessment & Plan Note (Signed)
On solo metformin - but does not like GI side effects Changed to Actos 07/2014 but stopped due to concern for wt gain side effects rx'd Januvia in 01/2015 but unable to take same because of cost Continue statin, ARB, ASA 81 -  reports home cbgs "ok" -140s Check a1c now, adjust further if needed Lab Results  Component Value Date   HGBA1C 6.2 02/07/2015

## 2015-06-15 NOTE — Progress Notes (Signed)
Pre visit review using our clinic review tool, if applicable. No additional management support is needed unless otherwise documented below in the visit note. 

## 2015-07-04 ENCOUNTER — Encounter: Payer: Self-pay | Admitting: Podiatry

## 2015-07-04 ENCOUNTER — Ambulatory Visit (INDEPENDENT_AMBULATORY_CARE_PROVIDER_SITE_OTHER): Payer: Medicare Other | Admitting: Podiatry

## 2015-07-04 VITALS — BP 122/70 | HR 97 | Resp 12

## 2015-07-04 DIAGNOSIS — E119 Type 2 diabetes mellitus without complications: Secondary | ICD-10-CM

## 2015-07-04 NOTE — Progress Notes (Signed)
   Subjective:    Patient ID: Heidi Franklin, female    DOB: Mar 05, 1946, 69 y.o.   MRN: [id number]  HPI This patient presents today and is requesting diabetic foot examination. The last visit for this similar service was on 06/13/2014. Patient denies any history of amputation, claudication or ulcerations. Patient denies any specific foot complaints at this time.   Review of Systems  Eyes: Positive for pain.       Objective:   Physical Exam  Pleasant orientated 3  Vascular: DP and PT pulses 2/4 bilaterally Capillary reflex immediate bilaterally  Neurological: Sensation to 10 g monofilament wire intact 5/5 bilaterally Vibratory sensation reactive bilaterally Ankle reflex equal and reactive bilaterally  Dermatological: Texture and turgor within normal limits bilaterally No skin lesions noted bilaterally  Musculoskeletal: Pes planus bilaterally Mild HAV deformities bilaterally There is no restriction ankle, subtalar, midtarsal joints bilaterally      Assessment & Plan:   Assessment: Diabetic without complications  Plan: Today I reviewed the results of the examination with patient today. I made her aware that the diabetic foot screen was satisfactory without any obvious problems. I provided with general information for diabetic foot care in the after visit summary. Reappoint yearly or if patient has a specific concern

## 2015-07-04 NOTE — Patient Instructions (Signed)
Today your diabetic foot screen was satisfactory without any problems noted today Diabetes and Foot Care Diabetes may cause you to have problems because of poor blood supply (circulation) to your feet and legs. This may cause the skin on your feet to become thinner, break easier, and heal more slowly. Your skin may become dry, and the skin may peel and crack. You may also have nerve damage in your legs and feet causing decreased feeling in them. You may not notice minor injuries to your feet that could lead to infections or more serious problems. Taking care of your feet is one of the most important things you can do for yourself.  HOME CARE INSTRUCTIONS  Wear shoes at all times, even in the house. Do not go barefoot. Bare feet are easily injured.  Check your feet daily for blisters, cuts, and redness. If you cannot see the bottom of your feet, use a mirror or ask someone for help.  Wash your feet with warm water (do not use hot water) and mild soap. Then pat your feet and the areas between your toes until they are completely dry. Do not soak your feet as this can dry your skin.  Apply a moisturizing lotion or petroleum jelly (that does not contain alcohol and is unscented) to the skin on your feet and to dry, brittle toenails. Do not apply lotion between your toes.  Trim your toenails straight across. Do not dig under them or around the cuticle. File the edges of your nails with an emery board or nail file.  Do not cut corns or calluses or try to remove them with medicine.  Wear clean socks or stockings every day. Make sure they are not too tight. Do not wear knee-high stockings since they may decrease blood flow to your legs.  Wear shoes that fit properly and have enough cushioning. To break in new shoes, wear them for just a few hours a day. This prevents you from injuring your feet. Always look in your shoes before you put them on to be sure there are no objects inside.  Do not cross your  legs. This may decrease the blood flow to your feet.  If you find a minor scrape, cut, or break in the skin on your feet, keep it and the skin around it clean and dry. These areas may be cleansed with mild soap and water. Do not cleanse the area with peroxide, alcohol, or iodine.  When you remove an adhesive bandage, be sure not to damage the skin around it.  If you have a wound, look at it several times a day to make sure it is healing.  Do not use heating pads or hot water bottles. They may burn your skin. If you have lost feeling in your feet or legs, you may not know it is happening until it is too late.  Make sure your health care provider performs a complete foot exam at least annually or more often if you have foot problems. Report any cuts, sores, or bruises to your health care provider immediately. SEEK MEDICAL CARE IF:   You have an injury that is not healing.  You have cuts or breaks in the skin.  You have an ingrown nail.  You notice redness on your legs or feet.  You feel burning or tingling in your legs or feet.  You have pain or cramps in your legs and feet.  Your legs or feet are numb.  Your feet always feel  cold. SEEK IMMEDIATE MEDICAL CARE IF:   There is increasing redness, swelling, or pain in or around a wound.  There is a red line that goes up your leg.  Pus is coming from a wound.  You develop a fever or as directed by your health care provider.  You notice a bad smell coming from an ulcer or wound. Document Released: 10/11/2000 Document Revised: 06/16/2013 Document Reviewed: 03/23/2013 [facility] Patient Information 2015 [place], [place]. This information is not intended to replace advice given to you by your health care provider. Make sure you discuss any questions you have with your health care provider.

## 2015-07-05 ENCOUNTER — Encounter: Payer: Self-pay | Admitting: Internal Medicine

## 2015-07-05 ENCOUNTER — Ambulatory Visit (INDEPENDENT_AMBULATORY_CARE_PROVIDER_SITE_OTHER): Payer: Medicare Other | Admitting: Internal Medicine

## 2015-07-05 VITALS — BP 110/62 | HR 101 | Ht 60.0 in | Wt 176.6 lb

## 2015-07-05 DIAGNOSIS — Z23 Encounter for immunization: Secondary | ICD-10-CM

## 2015-07-05 DIAGNOSIS — E669 Obesity, unspecified: Secondary | ICD-10-CM

## 2015-07-05 DIAGNOSIS — R058 Other specified cough: Secondary | ICD-10-CM

## 2015-07-05 DIAGNOSIS — R05 Cough: Secondary | ICD-10-CM

## 2015-07-05 MED ORDER — PREDNISONE 10 MG PO TABS: ORAL_TABLET | ORAL | Status: DC

## 2015-07-05 MED ORDER — GABAPENTIN 300 MG PO CAPS: 300.0000 mg | ORAL_CAPSULE | Freq: Three times a day (TID) | ORAL | Status: DC

## 2015-07-05 MED ORDER — AMITRIPTYLINE HCL 10 MG PO TABS: 10.0000 mg | ORAL_TABLET | Freq: Every day | ORAL | Status: DC

## 2015-07-05 NOTE — Patient Instructions (Addendum)
Prednisone 10 mg take  4 each am x 2 days,   2 each am x 2 days,  1 each am x 2 days and stop   Elavil 10 mg one at bedtime automatically u  Increase the neurontin 300 mg three times a day  Try the over the counter ear wax kit and see [name] NP for irrigation if not better or let us refer you to ENT   Please schedule a follow up office visit in 4 weeks, sooner if needed - return with meds in two bags - automatic vs as needed / bring pill box

## 2015-07-05 NOTE — Progress Notes (Signed)
Subjective:    Patient ID: Heidi Franklin, female   DOB: Jan 08, 1946    MRN: [id number]  Brief patient profile:  29  yobf never smoker with chronic cough  since age 69's only resolves for a week at a time then recurs daily,  dx as  post-nasal drip with nasal septal deviation, and GERD with neg MCT 11/2013      History of Present Illness  08/03/2012 f/u ov/Windell Musson cc cough for decades, better on present rx than for many years prior to pulmonary clinic changes, not using calendar, poor hfa  Technique but no limiting sob.  rec Change zyrtec to where you take it only if needed for itching/ sneezing  Work on inhaler technique:      Ok to use symbicort with spacer if not improving     Brief patient profile:  07/29/2013 f/u ov/Alyce Inscore re: chronic cough since age 89  Chief Complaint  Patient presents with  . Follow-up    Breathing is some worse since the last visit. She is needing xopenex for rescue approx 2 x per wk. She c/o hoarseness and ear pain.    >>Neurontin Three times a day  rx , change b/p meds to tribenzor    12/06/2013 NP Follow up and Med review  Patient returns for a followup visit and medication review. We reviewed all her medications organized them into a medication calendar with patient education. Appears the patient is taking her medications correctly. Since last visit. Patient reports that she is doing better. Cough is not as bad. Last ov Symbicort was changed to As needed  Use only.  She is using on average every other day. Not sure if she needs or not.  No fever, chest pain , edema or wheezing. No xopenex use since last ov.  rec MCT > neg > d/c symbicort    06/30/2014 f/u ov/Ilean Spradlin re:  Uacs/ neg MCT / no med calendar  Chief Complaint  Patient presents with  . Acute Visit    Pt c/o cough for the past 2 days. Cough is mainly non prod, but did have very minimal hemoptysis this am.   cough worse at hs, not clear she's really following med calendar at all. Admits never in her life  cough free more than a few days. Mostly sob when coughing  rec Change neurontin to 100 mg four times a day Take the pm pepcid and 2 chlortrimeton at least an hour or two before bedtime For cough >>Take delsym two tsp every 12 hours and supplement if needed with  tramadol 50 mg up to 2 every 4 hours to suppress the urge to cough. Swallowing water or using ice chips/non mint and menthol containing candies (such as lifesavers or sugarless jolly ranchers) are also effective.  You should rest your voice and avoid activities that you know make you cough. Once you have eliminated the cough for 3 straight days try reducing the tramadol first,  then the delsym as tolerated.   Prednisone 10 mg take  4 each am x 2 days,   2 each am x 2 days,  1 each am x 2 days and stop zpak   08/23/2014 f/u ov/Abrina Petz re: chronic hoarseness / cough  Chief Complaint  Patient presents with  . Follow-up    Pt states that her cough has improved since the last visit. Her breathing is doing well today.  She is using xopenex once every 2 wks on average and using symbicort the same.  Not following med calendar/ multiple errors. Only using neurotin 100 tid  >>Increase Neurontin Four times a day      04/10/2015 f/u ov/Shalika Arntz re: chronic cough / no med calendar  Chief Complaint  Patient presents with  . Follow-up    CXR done today. Pt states her cough is some better- still prod at times with light yellow sputum. She states that she is still wheezing some and uses her albuterol inhaler 2 x per day on average.   Cough bothers her the most at hs and church and better with chlortrimeton had improved but  no longer on neurontin then "caught cold" early May 2016 and can't shake it  Always better while on prednisone  Really only sob when coughing  >>increased gabapentin Four times a day  And pred taper   05/02/2015 NP Follow up and Med Review : Chronic cough  We reviewed all her meds and organized them into a med calendar with pt  education  Gabapentin was increased last ov to Four times a day  -she has not started the new dose yet.  Also given prednisone taper which has helped while she was on the taper.  Cough and hoarseness some better but still wax and wane.  Has hoarseness on/off . Seen by ENT with layryngoscopy in past few years ago.  We discussed cough control measures .  rec Increase neurontin 100 qid   07/05/2015 f/u ov/Lainey Nelson re: cough since her 20's really bad x last 10 years  Chief Complaint  Patient presents with  . Follow-up    Pt states having increased cough "since the weather has been hot".  She is using rescue inhaler 2-3 x per wk.    Using med calendar but note it does not line up with pill boxes and not able to verify she's actually taking the meds on the calendar when asked how she uses the calendar. Says pred always helps some then effects where off over a week  No obvious day to day or daytime variability or assoc sob  or cp or chest tightness, subjective wheeze or overt sinus or hb symptoms. No unusual exp hx or h/o childhood pna/ asthma or knowledge of premature birth.  Sleeping ok without nocturnal  or early am exacerbation  of respiratory  c/o's or need for noct saba. Also denies any obvious fluctuation of symptoms with weather or environmental changes or other aggravating or alleviating factors except as outlined above   Current Medications, Allergies, Complete Past Medical History, Past Surgical History, Family History, and Social History were reviewed in [employer] record.  ROS  The following are not active complaints unless bolded sore throat, dysphagia, dental problems, itching, sneezing,  nasal congestion or excess/ purulent secretions, ear ache,   fever, chills, sweats, unintended wt loss, classically pleuritic or exertional cp, hemoptysis,  orthopnea pnd or leg swelling, presyncope, palpitations, abdominal pain, anorexia, nausea, vomiting, diarrhea  or change in  bowel or bladder habits, change in stools or urine, dysuria,hematuria,  rash, arthralgias, visual complaints, headache, numbness, weakness or ataxia or problems with walking or coordination,  change in mood/affect or memory.                Objective:   Physical Exam  Exam:  GEN: A/Ox3; pleasant , NAD, well nad   amb bf nad  With freq throat clearing/ classic pseudowheeze/ vital signs reviewed  05/20/2014       168   >168 06/21/2014 >  06/30/2014  168 > 08/23/2014  170 >183 03/20/2015 > 04/10/2015 180 >181 05/02/2015 >  07/05/2015 177  HEENT:  [place]/AT,  EACs-clear, TMs-wnl, NOSE-clear drainage  THROAT-clear, no lesions, no postnasal drip or exudate noted.     NECK:  Supple w/ fair ROM; no JVD; normal carotid impulses w/o bruits; no thyromegaly or nodules palpated; no lymphadenopathy.  RESP  Clear to A and P with no accessory muscle use, no dullness to percussion,    CARD:  RRR, no m/r/g  , no peripheral edema, pulses intact, no cyanosis or clubbing.  GI:   Soft & nt; nml bowel sounds; no organomegaly or masses detected.  Musco: Warm bil, no deformities or joint swelling noted.   Neuro: alert, no focal deficits noted.    Skin: Warm, no lesions or rashes     CXR PA and Lateral:   04/10/2015 :     I personally reviewed images and agree with radiology impression as follows:    Mediastinum hilar structures normal. Lungs are clear. Heart size normal. No pleural effusion or pneumothorax. No acute bony Abnormality.      Assessment:

## 2015-07-05 NOTE — Assessment & Plan Note (Signed)
CT sinus 11/08/11>>Negative paranasal sinuses.  Rightward nasal septal deviation and possible rhinitis. 01/13/12>>  RAST negative, IgE 22.7 PFT 11/07/11>>FEV1 1.79 (102%),  Ratio 82, TLC 4.18 (104%), DLCO 69%, positive BD only in terms of fef 25/75 -med calendar 11/23/2012 , 03/18/2013 , 08/26/2013 , 12/06/2013, 06/21/2014 > not keeping up with it  -methacholine challenge test  12/24/13 > neg  - try off singulair 12/31/2013 > no worse  - added neurontin 100 mg qid  05/20/14 but did not take consistently as of 06/30/2014 > increased to 100 qid 08/23/2014 improved then stopped it and flared spirng of 2016  - 07/05/2015 increase neurontin 300 tid and added elavil 10 mg at hs      Comment:  Clearly this is a case of  Classic Upper airway cough syndrome, so named because it's frequently impossible to sort out how much is  CR/sinusitis with freq throat clearing (which can be related to primary GERD)   vs  causing  secondary (" extra esophageal")  GERD from wide swings in gastric pressure that occur with throat clearing, often  promoting self use of mint and menthol lozenges that reduce the lower esophageal sphincter tone and exacerbate the problem further in a cyclical fashion.   These are the same pts (now being labeled as having "irritable larynx syndrome" by some cough centers) who not infrequently have a history of having failed to tolerate ace inhibitors,  dry powder inhalers or biphosphonates or report having atypical reflux symptoms that don't respond to standard doses of PPI , and are easily confused as having aecopd or asthma flares by even experienced allergists/ pulmonologists.   rec Increase neurontin to 300 tid  Add elavil 10 mg at hs and titrate up F/u q 3 m  I had an extended discussion with the patient reviewing all relevant studies completed to date and  lasting 15 to 20 minutes of a 25 minute visit      Each maintenance medication was reviewed in detail including most importantly the difference  between maintenance and as needed and under what circumstances the prns are to be used. This was done in the context of a medication calendar review which provided the patient with a user-friendly unambiguous mechanism for medication administration and reconciliation and provides an action plan for all active problems. It is critical that this be shown to every doctor  for modification during the office visit if necessary so the patient can use it as a working document.        Please see instructions for details which were reviewed in writing and the patient given a copy highlighting the part that I personally wrote and discussed at today's ov.

## 2015-07-05 NOTE — Assessment & Plan Note (Signed)
Body mass index is 34.49  - no improvement    Lab Results  Component Value Date   TSH 2.26 05/02/2015     Contributing to gerd tendency/ doe/reviewed need  achieve and maintain neg calorie balance > defer f/u primary care including intermittently monitoring thyroid status

## 2015-07-08 ENCOUNTER — Ambulatory Visit: Payer: Medicare Other | Admitting: Internal Medicine

## 2015-07-13 ENCOUNTER — Encounter: Payer: Self-pay | Admitting: Family Medicine

## 2015-07-13 ENCOUNTER — Other Ambulatory Visit (INDEPENDENT_AMBULATORY_CARE_PROVIDER_SITE_OTHER): Payer: Medicare Other

## 2015-07-13 ENCOUNTER — Ambulatory Visit (INDEPENDENT_AMBULATORY_CARE_PROVIDER_SITE_OTHER): Payer: Medicare Other | Admitting: Family Medicine

## 2015-07-13 VITALS — BP 124/64 | HR 100 | Ht 60.0 in | Wt 171.0 lb

## 2015-07-13 DIAGNOSIS — M25512 Pain in left shoulder: Secondary | ICD-10-CM | POA: Diagnosis not present

## 2015-07-13 DIAGNOSIS — M7552 Bursitis of left shoulder: Secondary | ICD-10-CM

## 2015-07-13 NOTE — Patient Instructions (Signed)
Good to see you.  Ice 20 minutes 2 times daily. Usually after activity and before bed. Exercises 3 times a week.  Vitamin D 2000 iU daily pennsaid pinkie amount topically 2 times daily as needed.  Avoid any heavy lifting  See me again in 4 weeks.

## 2015-07-13 NOTE — Progress Notes (Signed)
[name] [address] [place], [place] [address] Phone: [phone] Subjective:    I'm seeing this patient by the request  of:  [name], MD   CC: Left shoulder pain  [id number] Heidi Franklin is a 69 y.o. female coming in with complaint of left shoulder pain. Patient states it is more of a dull throbbing aching pain that hurts with certain range of motion. Patient does not know which one specifically. States that it seems to be worse at night. Can make it challenging to find a comfortable position at night. Denies any numbness or any significant radiation down the arm. Denies any associated neck pain. Patient rates the severity of pain though is 8 out of 10. Seems to be worsening and not improving. Oral anti-inflammatory's are helpful. The arm. Does not remember any true injury but has had 3 recent falls.  Past Medical History  Diagnosis Date  . BRONCHITIS, CHRONIC   . ALLERGIC RHINITIS   . Anxiety state, unspecified   . DEPRESSION   . DIABETES MELLITUS, TYPE II   . GERD   . HYPERLIPIDEMIA   . HYPERTENSION    Past Surgical History  Procedure Laterality Date  . Abdominal hysterectomy  1980  . Trigger finger release      Index finger   Social History  Substance Use Topics  . Smoking status: Never Smoker   . Smokeless tobacco: Never Used  . Alcohol Use: No   Allergies  Allergen Reactions  . Penicillins Rash   Family History  Problem Relation Age of Onset  . Stroke Mother   . Diabetes Father   . Hyperlipidemia Other     Parent  . Hypertension Other     Parent  . Colon cancer Neg Hx   . Esophageal cancer Neg Hx   . Rectal cancer Neg Hx   . Stomach cancer Neg Hx   . Pancreatic cancer Neg Hx         Past medical history, social, surgical and family history all reviewed in electronic medical record.   Review of Systems: No headache, visual changes, nausea, vomiting, diarrhea, constipation, dizziness, abdominal pain, skin  rash, fevers, chills, night sweats, weight loss, swollen lymph nodes, body aches, joint swelling, muscle aches, chest pain, shortness of breath, mood changes.   Objective Blood pressure 124/64, pulse 100, height 5' (1.524 m), weight 171 lb (77.565 kg), SpO2 99 %.  General: No apparent distress alert and oriented x3 mood and affect normal, dressed appropriately.  HEENT: Pupils equal, extraocular movements intact  Respiratory: Patient's speak in full sentences and does not appear short of breath  Cardiovascular: No lower extremity edema, non tender, no erythema  Skin: Warm dry intact with no signs of infection or rash on extremities or on axial skeleton.  Abdomen: Soft nontender  Neuro: Cranial nerves II through XII are intact, neurovascularly intact in all extremities with 2+ DTRs and 2+ pulses.  Lymph: No lymphadenopathy of posterior or anterior cervical chain or axillae bilaterally.  Gait normal with good balance and coordination.  MSK:  Non tender with full range of motion and good stability and symmetric strength and tone of  elbows, wrist, hip, knee and ankles bilaterally.  Shoulder: left Inspection reveals no abnormalities, atrophy or asymmetry. Palpation is normal with no tenderness over AC joint or bicipital groove. ROM is full in all planes passively. Rotator cuff strength normal throughout. signs of impingement with positive Neer and Hawkin's tests, but negative empty can  sign. Speeds and Yergason's tests normal. No labral pathology noted with negative Obrien's, negative clunk and good stability. Normal scapular function observed. No painful arc and no drop arm sign. No apprehension sign  MSK US performed of: left This study was ordered, performed, and interpreted by Charlann Boxer D.O.  Shoulder:   Supraspinatus:  Mild intersubstance tearing but large articular side bursa noted  Infraspinatus:  Appears normal on long and transverse views. Significant increase in Doppler  flow Subscapularis:  Appears normal on long and transverse views. Positive bursa Teres Minor:  Appears normal on long and transverse views. AC joint:  Mild arthritis Glenohumeral Joint:  Appears normal without effusion. Glenoid Labrum: Mild degenerative changes Biceps Tendon:  Appears normal on long and transverse views, no fraying of tendon, tendon located in intertubercular groove, no subluxation with shoulder internal or external rotation.  Impression: Subacromial bursitis  Procedure: Real-time Ultrasound Guided Injection of left glenohumeral joint Device: GE Logiq E  Ultrasound guided injection is preferred based studies that show increased duration, increased effect, greater accuracy, decreased procedural pain, increased response rate with ultrasound guided versus blind injection.  Verbal informed consent obtained.  Time-out conducted.  Noted no overlying erythema, induration, or other signs of local infection.  Skin prepped in a sterile fashion.  Local anesthesia: Topical Ethyl chloride.  With sterile technique and under real time ultrasound guidance:  Joint visualized.  23g 1  inch needle inserted posterior approach. Pictures taken for needle placement. Patient did have injection of 2 cc of 1% lidocaine, 2 cc of 0.5% Marcaine, and 1.0 cc of Kenalog 40 mg/dL. Completed without difficulty  Pain immediately resolved suggesting accurate placement of the medication.  Advised to call if fevers/chills, erythema, induration, drainage, or persistent bleeding.  Images permanently stored and available for review in the ultrasound unit.  Impression: Technically successful ultrasound guided injection.  Procedure note 65993; 15 minutes spent for Therapeutic exercises as stated in above notes.  This included exercises focusing on stretching, strengthening, with significant focus on eccentric aspects.Shoulder Exercises that included:  Basic scapular stabilization to include adduction and  depression of scapula Scaption, focusing on proper movement and good control Internal and External rotation utilizing a theraband, with elbow tucked at side entire time Rows with theraband   Proper technique shown and discussed handout in great detail with ATC.  All questions were discussed and answered.     Impression and Recommendations:     This case required medical decision making of moderate complexity.

## 2015-07-13 NOTE — Progress Notes (Signed)
Pre visit review using our clinic review tool, if applicable. No additional management support is needed unless otherwise documented below in the visit note. 

## 2015-07-13 NOTE — Assessment & Plan Note (Addendum)
Injected today, HEP given and worked with ATC Mild intersubstance tearing but no full thickness.   Ice, topical antiinflammatories.  RTC in 4 weeks.

## 2015-07-18 ENCOUNTER — Telehealth: Payer: Self-pay | Admitting: Neurology

## 2015-07-18 NOTE — Telephone Encounter (Signed)
Spoke with patient to see if we can reschedule her sleep test and she wants to wait a little longer

## 2015-08-03 ENCOUNTER — Other Ambulatory Visit: Payer: Self-pay | Admitting: Internal Medicine

## 2015-08-03 ENCOUNTER — Encounter: Payer: Self-pay | Admitting: Internal Medicine

## 2015-08-03 ENCOUNTER — Ambulatory Visit (INDEPENDENT_AMBULATORY_CARE_PROVIDER_SITE_OTHER): Payer: Medicare Other | Admitting: Internal Medicine

## 2015-08-03 VITALS — BP 142/78 | HR 90 | Ht 60.0 in | Wt 171.0 lb

## 2015-08-03 DIAGNOSIS — R058 Other specified cough: Secondary | ICD-10-CM

## 2015-08-03 DIAGNOSIS — R05 Cough: Secondary | ICD-10-CM | POA: Diagnosis not present

## 2015-08-03 DIAGNOSIS — E669 Obesity, unspecified: Secondary | ICD-10-CM | POA: Diagnosis not present

## 2015-08-03 MED ORDER — FLUTTER DEVI: Status: DC

## 2015-08-03 NOTE — Patient Instructions (Addendum)
Add flutter valve anytime you are coughing to prevent airway from getting irritated from coughing  Increase neurontin to 300 mg three times daily   See [name] NP 4  weeks with all your medications, your pillboxes and  even over the counter meds, separated in two separate bags, the ones you take no matter what vs the ones you stop once you feel better and take only as needed when you feel you need them.   [name]  will generate for you a new user friendly medication calendar that will put [place] all on the same page re: your medication use.     Without this process, it simply isn't possible to assure that we are providing  your outpatient care  with  the attention to detail we feel you deserve.   If we cannot assure that you're getting that kind of care,  then we cannot manage your problem effectively from this clinic.  Once you have seen [name] and we are sure that we're all on the same page with your medication use she will arrange follow up with me.

## 2015-08-03 NOTE — Assessment & Plan Note (Signed)
Body mass index is 33.4 - no real change  Lab Results  Component Value Date   TSH 2.26 05/02/2015     Contributing to gerd tendency/ doe/reviewed the need and the process to achieve and maintain neg calorie balance > defer f/u primary care including intermittently monitoring thyroid status

## 2015-08-03 NOTE — Progress Notes (Signed)
Subjective:    Patient ID: Heidi Franklin, female   DOB: 03-27-1946    MRN: [id number]  Brief patient profile:  69  yobf never smoker with chronic cough  since age 69's only resolves for a week at a time then recurs daily,  dx as  post-nasal drip with nasal septal deviation, and GERD with neg MCT 11/2013      History of Present Illness  08/03/2012 f/u ov/Jarel Cuadra cc cough for decades, better on present rx than for many years prior to pulmonary clinic changes, not using calendar, poor hfa  Technique but no limiting sob.  rec Change zyrtec to where you take it only if needed for itching/ sneezing  Work on inhaler technique:      Ok to use symbicort with spacer if not improving     Brief patient profile:  07/29/2013 f/u ov/Azie Mcconahy re: chronic cough since age 33  Chief Complaint  Patient presents with  . Follow-up    Breathing is some worse since the last visit. She is needing xopenex for rescue approx 2 x per wk. She c/o hoarseness and ear pain.    >>Neurontin Three times a day  rx , change b/p meds to tribenzor    12/06/2013 NP Follow up and Med review  Patient returns for a followup visit and medication review. We reviewed all her medications organized them into a medication calendar with patient education. Appears the patient is taking her medications correctly. Since last visit. Patient reports that she is doing better. Cough is not as bad. Last ov Symbicort was changed to As needed  Use only.  She is using on average every other day. Not sure if she needs or not.  No fever, chest pain , edema or wheezing. No xopenex use since last ov.  rec MCT > neg > d/c symbicort    06/30/2014 f/u ov/Dalaysia Harms re:  Uacs/ neg MCT / no med calendar  Chief Complaint  Patient presents with  . Acute Visit    Pt c/o cough for the past 2 days. Cough is mainly non prod, but did have very minimal hemoptysis this am.   cough worse at hs, not clear she's really following med calendar at all. Admits never in her life  cough free more than a few days. Mostly sob when coughing  rec Change neurontin to 100 mg four times a day Take the pm pepcid and 2 chlortrimeton at least an hour or two before bedtime For cough >>Take delsym two tsp every 12 hours and supplement if needed with  tramadol 50 mg up to 2 every 4 hours to suppress the urge to cough. Swallowing water or using ice chips/non mint and menthol containing candies (such as lifesavers or sugarless jolly ranchers) are also effective.  You should rest your voice and avoid activities that you know make you cough. Once you have eliminated the cough for 3 straight days try reducing the tramadol first,  then the delsym as tolerated.   Prednisone 10 mg take  4 each am x 2 days,   2 each am x 2 days,  1 each am x 2 days and stop zpak   08/23/2014 f/u ov/Kelseigh Diver re: chronic hoarseness / cough  Chief Complaint  Patient presents with  . Follow-up    Pt states that her cough has improved since the last visit. Her breathing is doing well today.  She is using xopenex once every 2 wks on average and using symbicort the same.  Not following med calendar/ multiple errors. Only using neurotin 100 tid  >>Increase Neurontin Four times a day      04/10/2015 f/u ov/Miranda Frese re: chronic cough / no med calendar  Chief Complaint  Patient presents with  . Follow-up    CXR done today. Pt states her cough is some better- still prod at times with light yellow sputum. She states that she is still wheezing some and uses her albuterol inhaler 2 x per day on average.   Cough bothers her the most at hs and church and better with chlortrimeton had improved but  no longer on neurontin then "caught cold" early May 2016 and can't shake it  Always better while on prednisone  Really only sob when coughing  >>increased gabapentin Four times a day  And pred taper   05/02/2015 NP Follow up and Med Review : Chronic cough  We reviewed all her meds and organized them into a med calendar with pt  education  Gabapentin was increased last ov to Four times a day  -she has not started the new dose yet.  Also given prednisone taper which has helped while she was on the taper.  Cough and hoarseness some better but still wax and wane.  Has hoarseness on/off . Seen by ENT with layryngoscopy in past few years ago.  We discussed cough control measures .  rec Increase neurontin 100 qid   07/05/2015 f/u ov/Krystal Delduca re: cough since her 20's really bad x last 10 years  Chief Complaint  Patient presents with  . Follow-up    Pt states having increased cough "since the weather has been hot".  She is using rescue inhaler 2-3 x per wk.   Using med calendar but note it does not line up with pill boxes and not able to verify she's actually taking the meds on the calendar when asked how she uses the calendar. Says pred always helps some then effects wear off over a week rec Prednisone 10 mg take  4 each am x 2 days,   2 each am x 2 days,  1 each am x 2 days and stop  Elavil 10 mg one at bedtime automatically   Increase the neurontin 300 mg three times a day  Please schedule a follow up office visit in 4 weeks, sooner if needed - return with meds in two bags - automatic vs as needed / bring pill box    08/03/2015  f/u ov/Gordana Kewley re: uacs/ severe hoarseness / no meds or pillboxes / did not increase neurontin  Chief Complaint  Patient presents with  . Follow-up    UACS. Pt states that her breathing is doing well. Pt c/o of dry cough/wheeze/SOB. Pt denies CP/tightness. Pt uses albuterol inhaler 2xs/wk.   Prednisone helped and has not lost ground since but still  Harsh dry cough daytime doesn't bother at hs   No obvious day to day or daytime variability or assoc sob  or cp or chest tightness, subjective wheeze or overt sinus or hb symptoms. No unusual exp hx or h/o childhood pna/ asthma or knowledge of premature birth.  Sleeping ok without nocturnal  or early am exacerbation  of respiratory  c/o's or need for  noct saba. Also denies any obvious fluctuation of symptoms with weather or environmental changes or other aggravating or alleviating factors except as outlined above   Current Medications, Allergies, Complete Past Medical History, Past Surgical History, Family History, and Social History were reviewed in [employer]  medical record.  ROS  The following are not active complaints unless bolded sore throat, dysphagia, dental problems, itching, sneezing,  nasal congestion or excess/ purulent secretions, ear ache,   fever, chills, sweats, unintended wt loss, classically pleuritic or exertional cp, hemoptysis,  orthopnea pnd or leg swelling, presyncope, palpitations, abdominal pain, anorexia, nausea, vomiting, diarrhea  or change in bowel or bladder habits, change in stools or urine, dysuria,hematuria,  rash, arthralgias, visual complaints, headache, numbness, weakness or ataxia or problems with walking or coordination,  change in mood/affect or memory.                Objective:   Physical Exam   Exam:  GEN: A/Ox3; pleasant , NAD,  amb bf nad    classic pseudowheeze/ harsh upper airway cough when uses voice   05/20/2014       168   >168 06/21/2014 >  06/30/2014  168 > 08/23/2014  170 >183 03/20/2015 > 04/10/2015 180 >181 05/02/2015 >  07/05/2015 177  > 08/03/2015 171  Vital signs reviewed  HEENT:  [place]/AT,  EACs  Bilateral wax R > L , NOSE-clear drainage  THROAT-clear, no lesions, no postnasal drip or exudate noted.     NECK:  Supple w/ fair ROM; no JVD; normal carotid impulses w/o bruits; no thyromegaly or nodules palpated; no lymphadenopathy.  RESP  Clear to A and P with no accessory muscle use, no dullness to percussion,    CARD:  RRR, no m/r/g  , no peripheral edema, pulses intact, no cyanosis or clubbing.  GI:   Soft & nt; nml bowel sounds; no organomegaly or masses detected.  Musco: Warm bil, no deformities or joint swelling noted.   Neuro: alert, no focal deficits noted.     Skin: Warm, no lesions or rashes     CXR PA and Lateral:   04/10/2015 :     I personally reviewed images and agree with radiology impression as follows:    Mediastinum hilar structures normal. Lungs are clear. Heart size normal. No pleural effusion or pneumothorax. No acute bony Abnormality.      Assessment:

## 2015-08-03 NOTE — Assessment & Plan Note (Addendum)
CT sinus 11/08/11>>Negative paranasal sinuses.  Rightward nasal septal deviation and possible rhinitis. 01/13/12>>  RAST negative, IgE 22.7 PFT 11/07/11>>FEV1 1.79 (102%),  Ratio 82, TLC 4.18 (104%), DLCO 69%, positive BD only in terms of fef 25/75 -med calendar 11/23/2012 , 03/18/2013 , 08/26/2013 , 12/06/2013, 06/21/2014 > not keeping up with it  -methacholine challenge test  12/24/13 > neg  - try off singulair 12/31/2013 > no worse  - added neurontin 100 mg qid  05/20/14 but did not take consistently as of 06/30/2014 > increased to 100 qid 08/23/2014 improved then stopped it and flared spirng of 2016  - 07/05/2015 increase neurontin 300 tid and added elavil 10 mg at hs > not clear she did this at f/u ov 08/03/2015  - added flutter valve 08/03/2015   Not able to verify she's doing what we're asking so no need to proceed further with w/u.  To keep things simple, I have asked the patient to first separate medicines that are perceived as maintenance, that is to be taken daily "no matter what", from those medicines that are taken on only on an as-needed basis and I have given the patient examples of both, and then return to see our NP to generate a  detailed  medication calendar which should be followed until the next physician sees the patient and updates it.    Total time = 81m review case with pt/ discussion/ counseling/ giving and going over instructions (see avs)

## 2015-08-04 ENCOUNTER — Telehealth: Payer: Self-pay | Admitting: Internal Medicine

## 2015-08-04 NOTE — Telephone Encounter (Signed)
Spoke with pt. She had questions about when to use the flutter valve. Advised her that MW wants her to cough into the flutter valve every time she has the urge to cough. She verbalized understanding. Nothing further was needed.

## 2015-08-08 ENCOUNTER — Ambulatory Visit (INDEPENDENT_AMBULATORY_CARE_PROVIDER_SITE_OTHER): Payer: Medicare Other | Admitting: Family Medicine

## 2015-08-08 ENCOUNTER — Encounter: Payer: Self-pay | Admitting: Family Medicine

## 2015-08-08 VITALS — BP 118/70 | HR 87 | Ht 60.0 in | Wt 171.0 lb

## 2015-08-08 DIAGNOSIS — R296 Repeated falls: Secondary | ICD-10-CM

## 2015-08-08 DIAGNOSIS — M7552 Bursitis of left shoulder: Secondary | ICD-10-CM

## 2015-08-08 NOTE — Progress Notes (Signed)
[name] [address] [place], [place] [address] Phone: [phone] Subjective:      CC: Left shoulder pain follow up  [id number] Heidi Franklin is a 69 y.o. female coming in with complaint of left shoulder pain. Patient was seen and did have a subacromial bursitis. Patient was doing significantly better and then unfortunately had a fall. Patient states overall she was doing approximately 95% better until she fell on the left side. An states that it is more of a dull aching throbbing sensation there. Patient states that it seems to be improving slowly. Patient is more concerned about her continuing falls. Patient does not know if it is a medication or something else. Does not notice any weakness in any of the extremity is. Denies any fevers chills or any abnormal weight loss. Still has a good appetite and stays well-hydrated. Has not associated with any things particular.  Past Medical History  Diagnosis Date  . BRONCHITIS, CHRONIC   . ALLERGIC RHINITIS   . Anxiety state, unspecified   . DEPRESSION   . DIABETES MELLITUS, TYPE II   . GERD   . HYPERLIPIDEMIA   . HYPERTENSION    Past Surgical History  Procedure Laterality Date  . Abdominal hysterectomy  1980  . Trigger finger release      Index finger   Social History  Substance Use Topics  . Smoking status: Never Smoker   . Smokeless tobacco: Never Used  . Alcohol Use: No   Allergies  Allergen Reactions  . Penicillins Rash   Family History  Problem Relation Age of Onset  . Stroke Mother   . Diabetes Father   . Hyperlipidemia Other     Parent  . Hypertension Other     Parent  . Colon cancer Neg Hx   . Esophageal cancer Neg Hx   . Rectal cancer Neg Hx   . Stomach cancer Neg Hx   . Pancreatic cancer Neg Hx         Past medical history, social, surgical and family history all reviewed in electronic medical record.   Review of Systems: No headache, visual changes, nausea,  vomiting, diarrhea, constipation, dizziness, abdominal pain, skin rash, fevers, chills, night sweats, weight loss, swollen lymph nodes, body aches, joint swelling, muscle aches, chest pain, shortness of breath, mood changes.   Objective Blood pressure 118/70, pulse 87, height 5' (1.524 m), weight 171 lb (77.565 kg), SpO2 95 %.  General: No apparent distress alert and oriented x3 mood and affect normal, dressed appropriately.  HEENT: Pupils equal, extraocular movements intact  Respiratory: Patient's speak in full sentences and does not appear short of breath  Cardiovascular: No lower extremity edema, non tender, no erythema  Skin: Warm dry intact with no signs of infection or rash on extremities or on axial skeleton.  Abdomen: Soft nontender  Neuro: Cranial nerves II through XII are intact, neurovascularly intact in all extremities with 2+ DTRs and 2+ pulses.  Lymph: No lymphadenopathy of posterior or anterior cervical chain or axillae bilaterally.  Gait normal with good balance and coordination.  MSK:  Non tender with full range of motion and good stability and symmetric strength and tone of  elbows, wrist, hip, knee and ankles bilaterally.  Shoulder: left Inspection reveals no abnormalities, atrophy or asymmetry. Palpation is normal with no tenderness over AC joint or bicipital groove. ROM is full in all planes passively. Rotator cuff strength normal throughout. Mild impingement noted but improved improve his exam.  Speeds and Yergason's tests normal. No labral pathology noted with negative Obrien's, negative clunk and good stability. Normal scapular function observed. No painful arc and no drop arm sign. No apprehension sign       Impression and Recommendations:     This case required medical decision making of moderate complexity.

## 2015-08-08 NOTE — Progress Notes (Signed)
Pre visit review using our clinic review tool, if applicable. No additional management support is needed unless otherwise documented below in the visit note. 

## 2015-08-08 NOTE — Assessment & Plan Note (Signed)
Patient is doing very well after the injection. I do believe the patient has a contusion is likely going to have some mild discomfort over the course the next 2 weeks. I do not see any signs of new injury otherwise to the shoulder. We discussed continuing the icing and home exercises. Patient will continue with the topical anti-inflammatories. Patient come back and see me again in 4 weeks for further evaluation.

## 2015-08-08 NOTE — Patient Instructions (Signed)
Good to see you I would do the pennsaid twice daily Ice 10 minutes 3 times a day for next week.  Arnica lotion can help as well ask your pharmacist Stop the gabapentin, amitriptyline and tramadol if you can If not better in 2 weeks I would consider seeing neurology again.  See me again in 4 weeks if shoulder is still hurting.

## 2015-08-08 NOTE — Assessment & Plan Note (Signed)
Patient has had 4 falls over the course last several weeks. Patient does not remember any true recent line. No loss of consciousness. Would not say that she has any significant weakness or problem with speech. Has seen neurology in the past and encourage her to do so again. She does not have any cardiovascular type of arrhythmia she states. Possible workup may be necessary in the future. Patient wants to avoid a cardiologist at this point. We will take her off any medications a could increase her risk of falls. Patient will come back and see me again in 4 weeks.  Spent  25 minutes with patient face-to-face and had greater than 50% of counseling including as described above in assessment and plan.

## 2015-08-18 ENCOUNTER — Emergency Department (INDEPENDENT_AMBULATORY_CARE_PROVIDER_SITE_OTHER): Payer: Medicare Other

## 2015-08-18 ENCOUNTER — Encounter: Payer: Self-pay | Admitting: Emergency Medicine

## 2015-08-18 ENCOUNTER — Telehealth: Payer: Self-pay | Admitting: Internal Medicine

## 2015-08-18 ENCOUNTER — Emergency Department (INDEPENDENT_AMBULATORY_CARE_PROVIDER_SITE_OTHER)
Admission: EM | Admit: 2015-08-18 | Discharge: 2015-08-18 | Disposition: A | Discharge status: Home / Self Care | Payer: Medicare Other | Source: Home / Self Care | Attending: Family Medicine | Admitting: Family Medicine

## 2015-08-18 DIAGNOSIS — M722 Plantar fascial fibromatosis: Secondary | ICD-10-CM

## 2015-08-18 DIAGNOSIS — R200 Anesthesia of skin: Secondary | ICD-10-CM

## 2015-08-18 DIAGNOSIS — M7732 Calcaneal spur, left foot: Secondary | ICD-10-CM

## 2015-08-18 DIAGNOSIS — R208 Other disturbances of skin sensation: Secondary | ICD-10-CM

## 2015-08-18 NOTE — Telephone Encounter (Signed)
[facility]  Patient Name: Heidi Franklin  DOB: Feb 16, 1946    Initial Comment Caller states she is experiencing numbness on her left foot/heel. She is diabetic.    Nurse Assessment  Nurse: Margaretmary Dys, RN, Hettie Date/Time (Eastern Time): 08/18/2015 3:29:26 PM  Confirm and document reason for call. If symptomatic, describe symptoms. ---Caller states she is experiencing numbness on her left foot/heel. She is diabetic. Side of heel and goes 1/2 down foot. [place] a few x lately. Started a week ago. One heel.  Has the patient traveled out of the country within the last 30 days? ---Not Applicable  Does the patient have any new or worsening symptoms? ---Yes  Will a triage be completed? ---Yes  Related visit to physician within the last 2 weeks? ---No  Does the PT have any chronic conditions? (i.e. diabetes, asthma, etc.) ---Yes  List chronic conditions. ---Diabetes, allergies, high chol, high BP     Guidelines    Guideline Title Affirmed Question Affirmed Notes  Neurologic Deficit [1] Numbness (i.e., loss of sensation) of the face, arm / hand, or leg / foot on one side of the body AND [2] gradual onset (e.g., days to weeks) AND [3] present now    Final Disposition User   See Physician within 4 Hours (or PCP triage) Margaretmary Dys, RN, Hettie    Referrals  [facility]   Disagree/Comply: Comply

## 2015-08-18 NOTE — ED Provider Notes (Signed)
CSN: [id number]     Arrival date & time 08/18/15  1627 History   First MD Initiated Contact with Patient 08/18/15 1640     Chief Complaint  Patient presents with  . Foot Pain   (Consider location/radiation/quality/duration/timing/severity/associated sxs/prior Treatment) HPI  Pt is a 69yo female with hx of NIDMM, presenting to [facility] with c/o Left ankle and Left foot numbness on the lateral aspect for about 2 weeks after a trip and fall onto her Left foot.  Pt denies much pain.  Pt describes sensation as discomfort, aching and numb.  Certain movements or palpation worsen symptoms.  Denies hitting her head during the fall. Denies any other injuries.  Pt reports mild intermittent back pain and occasional Left knee pain but states the knee pain has not been any worse sense onset of Left ankle and foot numbness.  She has been taking pain medication for her Left shoulder, for which she is seeing an orthopedist, but has not seen anyone for her Left ankle and foot symptoms.  Last HgbA1c was 6 months ago, 6.2%  Past Medical History  Diagnosis Date  . BRONCHITIS, CHRONIC   . ALLERGIC RHINITIS   . Anxiety state, unspecified   . DEPRESSION   . DIABETES MELLITUS, TYPE II   . GERD   . HYPERLIPIDEMIA   . HYPERTENSION    Past Surgical History  Procedure Laterality Date  . Abdominal hysterectomy  1980  . Trigger finger release      Index finger   Family History  Problem Relation Age of Onset  . Stroke Mother   . Diabetes Father   . Hyperlipidemia Other     Parent  . Hypertension Other     Parent  . Colon cancer Neg Hx   . Esophageal cancer Neg Hx   . Rectal cancer Neg Hx   . Stomach cancer Neg Hx   . Pancreatic cancer Neg Hx    Social History  Substance Use Topics  . Smoking status: Never Smoker   . Smokeless tobacco: Never Used  . Alcohol Use: No   OB History    No data available     Review of Systems  Musculoskeletal: Positive for myalgias. Negative for joint swelling and  arthralgias.  Skin: Negative for color change, rash and wound.  Neurological: Positive for numbness. Negative for dizziness, weakness, light-headedness and headaches.    Allergies  Penicillins  Home Medications   Prior to Admission medications   Medication Sig Start Date End Date Taking? Authorizing Provider  albuterol (PROVENTIL HFA;VENTOLIN HFA) 108 (90 BASE) MCG/ACT inhaler Inhale 2 puffs into the lungs every 4 (four) hours as needed for wheezing or shortness of breath.    Historical Provider, MD  ALPRAZolam Duanne Moron) 0.5 MG tablet Take 1 tablet by mouth every 8 hours if needed for anxiety. 06/15/15   Rowe Clack, MD  amitriptyline (ELAVIL) 10 MG tablet Take 1 tablet (10 mg total) by mouth at bedtime. 07/05/15   Tanda Rockers, MD  amLODipine (NORVASC) 10 MG tablet Take 1 tablet (10 mg total) by mouth daily. 04/26/15   Rowe Clack, MD  aspirin 81 MG tablet Take 81 mg by mouth every morning.     Historical Provider, MD  calcium carbonate (OS-CAL) 600 MG TABS Take 600 mg by mouth daily with breakfast.     Historical Provider, MD  chlorpheniramine (CHLOR-TRIMETON) 4 MG tablet 1 tab by mouth at bedtime and every 4 hours as needed for drainage/drippy nose/sneezing  Historical Provider, MD  dextromethorphan (DELSYM) 30 MG/5ML liquid 2 tsp every 12 hours as needed for cough    Historical Provider, MD  famotidine (PEPCID) 20 MG tablet Take 1 tablet (20 mg total) by mouth at bedtime. 06/15/15   Rowe Clack, MD  fluticasone (FLONASE) 50 MCG/ACT nasal spray Place 1 spray into both nostrils 2 (two) times daily. 08/01/14   Rowe Clack, MD  glucose blood (ONE TOUCH ULTRA TEST) test strip 1 each by Other route daily. Use 1 strips to check blood sugar daily. Dx 250.00 06/10/12   Rowe Clack, MD  Lancets Glory Rosebush ULTRASOFT) lancets PRN 03/13/11   Rowe Clack, MD  lovastatin (MEVACOR) 20 MG tablet Take 1 tablet (20 mg total) by mouth at bedtime. 03/06/15   Rowe Clack, MD  meloxicam (MOBIC) 15 MG tablet Take 1 tablet (15 mg total) by mouth daily. 06/15/15   Rowe Clack, MD  metFORMIN (GLUCOPHAGE) 500 MG tablet Take 500 mg by mouth 2 (two) times daily with a meal.    Historical Provider, MD  Multiple Vitamins-Minerals (CENTRUM SILVER PO) Take 1 tablet by mouth every morning.    Historical Provider, MD  omeprazole (PRILOSEC) 40 MG capsule Take 1 capsule (40 mg total) by mouth daily. 05/09/15   Rowe Clack, MD  potassium chloride SA (K-DUR,KLOR-CON) 20 MEQ tablet Take 1 tablet (20 mEq total) by mouth every morning. 06/15/15   Rowe Clack, MD  Respiratory Therapy Supplies (FLUTTER) DEVI Use as directed 08/03/15   Tanda Rockers, MD  sertraline (ZOLOFT) 100 MG tablet Take 1 tablet (100 mg total) by mouth every morning. 06/15/15   Rowe Clack, MD  traMADol (ULTRAM) 50 MG tablet Take 1-2 tablets (50-100 mg total) by mouth every 6 (six) hours as needed. for cough 06/15/15   Rowe Clack, MD  valsartan-hydrochlorothiazide (DIOVAN HCT) 160-25 MG per tablet Take 1 tablet by mouth every morning. 06/15/15   Rowe Clack, MD   Meds Ordered and Administered this Visit  Medications - No data to display  BP 119/74 mmHg  Pulse 102  Temp(Src) 98.4 F (36.9 C) (Oral)  Wt 171 lb (77.565 kg)  SpO2 97% No data found.   Physical Exam  Constitutional: She is oriented to person, place, and time. She appears well-developed and well-nourished.  HENT:  Head: Normocephalic and atraumatic.  Eyes: EOM are normal.  Neck: Normal range of motion.  Cardiovascular: Normal rate.   Pulses:      Dorsalis pedis pulses are 2+ on the left side.       Posterior tibial pulses are 2+ on the left side.  Left foot: cap refill < 3 seconds  Pulmonary/Chest: Effort normal.  Musculoskeletal: Normal range of motion. She exhibits tenderness. She exhibits no edema.  Left ankle and foot: no edema or deformity. Full ROM ankle and toes. Mild tenderness to  lateral ankle and lateral side of foot.  Calf is soft, non-tender  Neurological: She is alert and oriented to person, place, and time.  Left foot: altered sensation to light touch along lateral aspect Left foot  Skin: Skin is warm and dry.  Left foot: skin in tact. No ecchymosis or erythema  Psychiatric: She has a normal mood and affect. Her behavior is normal.  Nursing note and vitals reviewed.   ED Course  Procedures (including critical care time)  Labs Review Labs Reviewed - No data to display  Imaging Review Dg Ankle Complete Left  08/18/2015  CLINICAL DATA:  Numbness LEFT foot and ankle for 1 week, diabetes mellitus EXAM: LEFT ANKLE COMPLETE - 3+ VIEW COMPARISON:  None FINDINGS: Mild osseous demineralization. Joint spaces preserved. Plantar calcaneal spur. No acute fracture, dislocation or bone destruction. IMPRESSION: Calcaneal spurring. No acute bony abnormalities. Electronically Signed   By: Lavonia Dana M.D.   On: 08/18/2015 17:22   Dg Foot Complete Left  08/18/2015  CLINICAL DATA:  Numbness in LEFT foot and ankle for 1 week, diabetes mellitus EXAM: LEFT FOOT - COMPLETE 3+ VIEW COMPARISON:  None FINDINGS: Mild osseous demineralization. Joint spaces preserved. Moderate-sized plantar calcaneal spur. Minimal spur formation at base of first metatarsal medially. No acute fracture, dislocation, or bone destruction. IMPRESSION: Calcaneal spurring. Minimal degenerative changes first TMT joint. No acute abnormalities. Electronically Signed   By: Lavonia Dana M.D.   On: 08/18/2015 17:23     MDM   1. Heel spur, left   2. Plantar fasciitis of left foot   3. Numbness of left foot    Pt c/o Left sided ankle and foot discomfort with numbness following a trip and fall 2 weeks ago. No other injuries.  No evidence of underlying skin infection. Pulses are good. Plain films: negative for acute injury. Significant for Calcaneal spurring.   This could be causing pt's symptoms with plantar  fascitis  Encouraged pt use OTC shoe inserts or to speak with her orthopedist who may be able to help with proper heel support. Home exercises provided. F/u with PCP or orthopedist in 1-2 weeks if not improving. Sooner if worsening. Patient verbalized understanding and agreement with treatment plan.     Noland Fordyce, PA-C 08/18/15 [phone]

## 2015-08-18 NOTE — Discharge Instructions (Signed)
You may use a tennis ball or frozen water bottom and roll it under your foot while seated on the couch or in a chair to help stretch the tendons and muscles in your foot to help relief pressure off nerves in your foot.

## 2015-08-18 NOTE — ED Notes (Addendum)
Pt c/o falling and landing on her left foot about 2 weeks ago. States her foot has been numb on the side since then.

## 2015-08-23 ENCOUNTER — Encounter: Payer: Self-pay | Admitting: Family Medicine

## 2015-09-01 ENCOUNTER — Encounter: Payer: Self-pay | Admitting: Adult Health

## 2015-09-01 ENCOUNTER — Ambulatory Visit (INDEPENDENT_AMBULATORY_CARE_PROVIDER_SITE_OTHER): Payer: Medicare Other | Admitting: Adult Health

## 2015-09-01 VITALS — BP 132/72 | HR 103 | Temp 98.7°F | Ht 60.0 in | Wt 170.0 lb

## 2015-09-01 DIAGNOSIS — R05 Cough: Secondary | ICD-10-CM

## 2015-09-01 DIAGNOSIS — R058 Other specified cough: Secondary | ICD-10-CM

## 2015-09-01 DIAGNOSIS — K219 Gastro-esophageal reflux disease without esophagitis: Secondary | ICD-10-CM

## 2015-09-01 NOTE — Progress Notes (Signed)
Subjective:    Patient ID: Heidi Franklin, female   DOB: 1946-01-01    MRN: [id number]  Brief patient profile:  69  yobf never smoker with chronic cough  since age 69's only resolves for a week at a time then recurs daily,  dx as  post-nasal drip with nasal septal deviation, and GERD with neg MCT 11/2013      History of Present Illness  08/03/2012 f/u ov/Wert cc cough for decades, better on present rx than for many years prior to pulmonary clinic changes, not using calendar, poor hfa  Technique but no limiting sob.  rec Change zyrtec to where you take it only if needed for itching/ sneezing  Work on inhaler technique:      Ok to use symbicort with spacer if not improving     Brief patient profile:  07/29/2013 f/u ov/Wert re: chronic cough since age 5  Chief Complaint  Patient presents with  . Follow-up    Breathing is some worse since the last visit. She is needing xopenex for rescue approx 2 x per wk. She c/o hoarseness and ear pain.    >>Neurontin Three times a day  rx , change b/p meds to tribenzor    12/06/2013 NP Follow up and Med review  Patient returns for a followup visit and medication review. We reviewed all her medications organized them into a medication calendar with patient education. Appears the patient is taking her medications correctly. Since last visit. Patient reports that she is doing better. Cough is not as bad. Last ov Symbicort was changed to As needed  Use only.  She is using on average every other day. Not sure if she needs or not.  No fever, chest pain , edema or wheezing. No xopenex use since last ov.  rec MCT > neg > d/c symbicort    06/30/2014 f/u ov/Wert re:  Uacs/ neg MCT / no med calendar  Chief Complaint  Patient presents with  . Acute Visit    Pt c/o cough for the past 2 days. Cough is mainly non prod, but did have very minimal hemoptysis this am.   cough worse at hs, not clear she's really following med calendar at all. Admits never in her life  cough free more than a few days. Mostly sob when coughing  rec Change neurontin to 100 mg four times a day Take the pm pepcid and 2 chlortrimeton at least an hour or two before bedtime For cough >>Take delsym two tsp every 12 hours and supplement if needed with  tramadol 50 mg up to 2 every 4 hours to suppress the urge to cough. Swallowing water or using ice chips/non mint and menthol containing candies (such as lifesavers or sugarless jolly ranchers) are also effective.  You should rest your voice and avoid activities that you know make you cough. Once you have eliminated the cough for 3 straight days try reducing the tramadol first,  then the delsym as tolerated.   Prednisone 10 mg take  4 each am x 2 days,   2 each am x 2 days,  1 each am x 2 days and stop zpak   08/23/2014 f/u ov/Wert re: chronic hoarseness / cough  Chief Complaint  Patient presents with  . Follow-up    Pt states that her cough has improved since the last visit. Her breathing is doing well today.  She is using xopenex once every 2 wks on average and using symbicort the same.  Not following med calendar/ multiple errors. Only using neurotin 100 tid  >>Increase Neurontin Four times a day      04/10/2015 f/u ov/Wert re: chronic cough / no med calendar  Chief Complaint  Patient presents with  . Follow-up    CXR done today. Pt states her cough is some better- still prod at times with light yellow sputum. She states that she is still wheezing some and uses her albuterol inhaler 2 x per day on average.   Cough bothers her the most at hs and church and better with chlortrimeton had improved but  no longer on neurontin then "caught cold" early May 2016 and can't shake it  Always better while on prednisone  Really only sob when coughing  >>increased gabapentin Four times a day  And pred taper   05/02/2015 NP Follow up and Med Review : Chronic cough  We reviewed all her meds and organized them into a med calendar with pt  education  Gabapentin was increased last ov to Four times a day  -she has not started the new dose yet.  Also given prednisone taper which has helped while she was on the taper.  Cough and hoarseness some better but still wax and wane.  Has hoarseness on/off . Seen by ENT with layryngoscopy in past few years ago.  We discussed cough control measures .  rec Increase neurontin 100 qid   07/05/2015 f/u ov/Wert re: cough since her 20's really bad x last 10 years  Chief Complaint  Patient presents with  . Follow-up    Pt states having increased cough "since the weather has been hot".  She is using rescue inhaler 2-3 x per wk.   Using med calendar but note it does not line up with pill boxes and not able to verify she's actually taking the meds on the calendar when asked how she uses the calendar. Says pred always helps some then effects wear off over a week rec Prednisone 10 mg take  4 each am x 2 days,   2 each am x 2 days,  1 each am x 2 days and stop  Elavil 10 mg one at bedtime automatically   Increase the neurontin 300 mg three times a day  Please schedule a follow up office visit in 4 weeks, sooner if needed - return with meds in two bags - automatic vs as needed / bring pill box    08/03/2015  f/u ov/Wert re: uacs/ severe hoarseness / no meds or pillboxes / did not increase neurontin  Chief Complaint  Patient presents with  . Follow-up    UACS. Pt states that her breathing is doing well. Pt c/o of dry cough/wheeze/SOB. Pt denies CP/tightness. Pt uses albuterol inhaler 2xs/wk.   Prednisone helped and has not lost ground since but still  Harsh dry cough daytime doesn't bother at hs  >>increase neurontin 300mg  Three times a day      09/01/2015 Follow up : Cough , never smoker  Patient returns for a one-month follow-up. Last visit. Patient was instructed to increase her Neurontin up to 3 times daily for persistent upper airway cough. Feels her cough is doing okay, has some dry cough but  no flare as she has had in past.   Patient says she was recently seen by her orthopedist to instructed her to stop her amitriptyline, gabapentin and tramadol due to frequent falls. She has been off for 2 weeks. Does feel better.  Feels more clear minded.  Workup for cough : CT sinus 11/08/11>>Negative paranasal sinuses.  Rightward nasal septal deviation and possible rhinitis. 01/13/12>>  RAST negative, IgE 22.7 PFT 11/07/11>>FEV1 1.79 (102%),  Ratio 82, TLC 4.18 (104%), DLCO 69%, positive BD only in terms of fef 25/75 -med calendar 11/23/2012 , 03/18/2013 , 08/26/2013 , 12/06/2013, 06/21/2014 > not keeping up with it  -methacholine challenge test  12/24/13 > neg  - try off singulair 12/31/2013 > no worse  - added neurontin 100 mg qid  05/20/14 but did not take consistently as of 06/30/2014 > increased to 100 qid 08/23/2014 improved then stopped it and flared spirng of 2016  - 07/05/2015 increase neurontin 300 tid and added elavil 10 mg at hs > not clear she did this at f/u ov 08/03/2015  - added flutter valve 08/03/2015    Feels that she is handing the cough right now.  Denies chest pain, orthopnea or edema .  Marland KitchenWe reviewed her meds and organized them into a med calendar appears to be taking correctly.         Current Medications, Allergies, Complete Past Medical History, Past Surgical History, Family History, and Social History were reviewed in [employer] record.  ROS  The following are not active complaints unless bolded sore throat, dysphagia, dental problems, itching, sneezing,  nasal congestion or excess/ purulent secretions, ear ache,   fever, chills, sweats, unintended wt loss, classically pleuritic or exertional cp, hemoptysis,  orthopnea pnd or leg swelling, presyncope, palpitations, abdominal pain, anorexia, nausea, vomiting, diarrhea  or change in bowel or bladder habits, change in stools or urine, dysuria,hematuria,  rash, arthralgias, visual complaints, headache,  numbness, weakness or ataxia or problems with walking or coordination,  change in mood/affect or memory.                Objective:   Physical Exam   Exam:  GEN: A/Ox3; pleasant , NAD,  amb bf nad     05/20/2014       168   >168 06/21/2014 >  06/30/2014  168 > 08/23/2014  170 >183 03/20/2015 > 04/10/2015 180 >181 05/02/2015 >  07/05/2015 177  > 08/03/2015 171  Vital signs reviewed  HEENT:  [place]/AT,  EACs    , NOSE-clear drainage  THROAT-clear, no lesions, no postnasal drip or exudate noted.     NECK:  Supple w/ fair ROM; no JVD; normal carotid impulses w/o bruits; no thyromegaly or nodules palpated; no lymphadenopathy.  RESP  Clear to A and P with no accessory muscle use, no dullness to percussion,    CARD:  RRR, no m/r/g  , no peripheral edema, pulses intact, no cyanosis or clubbing.  GI:   Soft & nt; nml bowel sounds; no organomegaly or masses detected.  Musco: Warm bil, no deformities or joint swelling noted.   Neuro: alert, no focal deficits noted.    Skin: Warm, no lesions or rashes     CXR PA and Lateral:   04/10/2015 :        Mediastinum hilar structures normal. Lungs are clear. Heart size normal. No pleural effusion or pneumothorax. No acute bony Abnormality.      Assessment:

## 2015-09-01 NOTE — Assessment & Plan Note (Signed)
GERD prevention

## 2015-09-01 NOTE — Patient Instructions (Signed)
Follow med calendar closely and bring to each visit. Use cough control regimen as discussed.  Follow up with Dr. Melvyn Novas  In 3 months and As needed

## 2015-09-01 NOTE — Progress Notes (Signed)
Chart and office note reviewed in detail  > agree with a/p as outlined noting her cough is benign and likely uacs with the focus on symptom control so if other symptoms - like being unsteady on feet - are developing then for sure need to try off and regroup

## 2015-09-01 NOTE — Assessment & Plan Note (Signed)
Chronic cough with extensive workup  Cont treatment regimen for cough prevention with GERD /AR.  Unable to tolerate neurontin  , ultram and elavil due to falls.  Patient's medications were reviewed today and patient education was given. Computerized medication calendar was adjusted/completed   Plan  Follow med calendar closely and bring to each visit. Use cough control regimen as discussed.  Follow up with Dr. Melvyn Novas  In 3 months and As needed

## 2015-09-05 ENCOUNTER — Other Ambulatory Visit (INDEPENDENT_AMBULATORY_CARE_PROVIDER_SITE_OTHER): Payer: Medicare Other

## 2015-09-05 ENCOUNTER — Ambulatory Visit (INDEPENDENT_AMBULATORY_CARE_PROVIDER_SITE_OTHER): Payer: Medicare Other | Admitting: Family Medicine

## 2015-09-05 ENCOUNTER — Encounter: Payer: Self-pay | Admitting: Family Medicine

## 2015-09-05 VITALS — BP 124/72 | HR 103 | Ht 60.0 in | Wt 172.0 lb

## 2015-09-05 DIAGNOSIS — M25512 Pain in left shoulder: Secondary | ICD-10-CM

## 2015-09-05 DIAGNOSIS — M79672 Pain in left foot: Secondary | ICD-10-CM

## 2015-09-05 DIAGNOSIS — R296 Repeated falls: Secondary | ICD-10-CM

## 2015-09-05 DIAGNOSIS — M7672 Peroneal tendinitis, left leg: Secondary | ICD-10-CM | POA: Diagnosis not present

## 2015-09-05 DIAGNOSIS — M7552 Bursitis of left shoulder: Secondary | ICD-10-CM | POA: Diagnosis not present

## 2015-09-05 NOTE — Assessment & Plan Note (Signed)
Sent to formal physical therapy 

## 2015-09-05 NOTE — Progress Notes (Signed)
[name] [place], [place] [address] Phone: [phone] Subjective:      CC: Left foot and heel pain  [id number] Jet Traynham is a 69 y.o. female coming in with complaint of left foot and heel pain. This is a new problem for patient. Patient was having significant pain and went to the emergency department. Patient was diagnosed with a potential heel spur. Patient was told to follow-up with someone else. Patient states it seems to be more on the lateral aspect the ankle on the lateral aspect of the foot. Patient describes it more as a dull, throbbing aching sensation. Patient states that this is becoming very frustrated. Denies any significant injury. Denies any swelling that is associated with it. Patient rates the severity of pain is more of a 6 out of 10. Denies any locking.   Patient was previously seen and was diagnosed with more of a subacromial bursitis. Patient was doing significantly better at last exam. Patient states still has some mild soreness. Has difficult he with overhead activity. Much better though than her first exam.  Patient was having some falls and was to follow up with neurology. Patient also stopped in different medications that could've been causing him. Patient states she is doing better off the medications.  Past Medical History  Diagnosis Date  . BRONCHITIS, CHRONIC   . ALLERGIC RHINITIS   . Anxiety state, unspecified   . DEPRESSION   . DIABETES MELLITUS, TYPE II   . GERD   . HYPERLIPIDEMIA   . HYPERTENSION    Past Surgical History  Procedure Laterality Date  . Abdominal hysterectomy  1980  . Trigger finger release      Index finger   Social History  Substance Use Topics  . Smoking status: Never Smoker   . Smokeless tobacco: Never Used  . Alcohol Use: No   Allergies  Allergen Reactions  . Penicillins Rash   Family History  Problem Relation Age of Onset  . Stroke Mother   . Diabetes Father   .  Hyperlipidemia Other     Parent  . Hypertension Other     Parent  . Colon cancer Neg Hx   . Esophageal cancer Neg Hx   . Rectal cancer Neg Hx   . Stomach cancer Neg Hx   . Pancreatic cancer Neg Hx         Past medical history, social, surgical and family history all reviewed in electronic medical record.   Review of Systems: No headache, visual changes, nausea, vomiting, diarrhea, constipation, dizziness, abdominal pain, skin rash, fevers, chills, night sweats, weight loss, swollen lymph nodes, body aches, joint swelling, muscle aches, chest pain, shortness of breath, mood changes.   Objective Blood pressure 124/72, pulse 103, height 5' (1.524 m), weight 172 lb (78.019 kg), SpO2 96 %.  General: No apparent distress alert and oriented x3 mood and affect normal, dressed appropriately.  HEENT: Pupils equal, extraocular movements intact  Respiratory: Patient's speak in full sentences and does not appear short of breath  Cardiovascular: No lower extremity edema, non tender, no erythema  Skin: Warm dry intact with no signs of infection or rash on extremities or on axial skeleton.  Abdomen: Soft nontender  Neuro: Cranial nerves II through XII are intact, neurovascularly intact in all extremities with 2+ DTRs and 2+ pulses.  Lymph: No lymphadenopathy of posterior or anterior cervical chain or axillae bilaterally.  Gait normal with good balance and coordination.  MSK:  Non tender with full range of motion and good stability and symmetric strength and tone of  elbows, wrist, hip, knees bilaterally.  Shoulder: left Inspection reveals no abnormalities, atrophy or asymmetry. Palpation is normal with no tenderness over AC joint or bicipital groove. ROM is full in all planes passively. Rotator cuff strength normal throughout. Mild impingement noted but improved improve his exam. Speeds and Yergason's tests normal. No labral pathology noted with negative Obrien's, negative clunk and good  stability. Normal scapular function observed. No painful arc and no drop arm sign. No apprehension sign  Ankle: Left No visible erythema or swelling. Range of motion is full in all directions. Strength is 5/5 in all directions. Mild tenderness to palpation over the peroneal tendons Talar dome nontender; No pain at base of 5th MT; No tenderness over cuboid; No tenderness over N spot or navicular prominence No tenderness on posterior aspects of lateral and medial malleolus Negative tarsal tunnel tinel's Able to walk 4 steps. Contralateral ankle unremarkable  MSK US performed of: Left ankle This study was ordered, performed, and interpreted by Charlann Boxer D.O.  Foot/Ankle:   All structures visualized.   Talar dome minimal arthritic changes Ankle mortise without effusion. Peroneus longus and brevis tendons hypoechoic changes especially posterior and inferior to the lateral malleolus. No signs of subluxation. Retinaculum seems to be intact.. Posterior tibialis, flexor hallucis longus, and flexor digitorum longus tendons unremarkable on long and transverse views without sheath effusions. Achilles tendon visualized along length of tendon and unremarkable on long and transverse views without sheath effusion. Anterior Talofibular Ligament and Calcaneofibular Ligaments unremarkable and intact. Deltoid Ligament unremarkable and intact. Plantar fascia intact and without effusion, normal thickness. No increased doppler signal, cap sign, or thickening of tibial cortex. Power doppler signal normal.  IMPRESSION:  Peroneal tendinitis  Procedure note 91916; 15 minutes spent for Therapeutic exercises as stated in above notes.  This included exercises focusing on stretching, strengthening, with significant focus on eccentric aspects.  Basic range of motion exercises to allow proper full motion at ankle Stretching of the lower leg and hamstrings  Theraband exercises for the lower leg - inversion,  eversion, dorsiflexion and plantarflexion each to be completed with a theraband Balance exercises to increase proprioception Weight bearing exercises to increase strength and balance Proper technique shown and discussed handout in great detail with ATC.  All questions were discussed and answered.     Impression and Recommendations:     This case required medical decision making of moderate complexity.

## 2015-09-05 NOTE — Addendum Note (Signed)
Addended by: Osa Craver on: 09/05/2015 01:57 PM   Modules accepted: Orders, Medications

## 2015-09-05 NOTE — Assessment & Plan Note (Signed)
Seems stable after being of some of the different medications. Patient is to follow-up with neurology.

## 2015-09-05 NOTE — Assessment & Plan Note (Signed)
Patient does have more of a peroneal tendinitis. We discussed anti-inflammatories, topical anti-inflammatory's, icing. Patient given exercises and work with Product/process development scientist today. We discussed air splint cast. Patient is going to try this conservative therapy and see me again in 3 weeks. If continued have pain we'll consider injection. I do not feel that any further workup is necessary at this time. I do believe that the numbness patient is having is more of the peroneal nerve getting compressed.

## 2015-09-05 NOTE — Progress Notes (Signed)
Pre visit review using our clinic review tool, if applicable. No additional management support is needed unless otherwise documented below in the visit note. 

## 2015-09-05 NOTE — Patient Instructions (Addendum)
Good to see you Ice 20 minutes 2 times daily. Usually after activity and before bed. Exercises 3 times a week.  Try brace daily if you like it pennsaid pinkie amount topically 2 times daily as needed.  Good shoes are key See me again in 3 weeks.

## 2015-09-12 ENCOUNTER — Ambulatory Visit: Payer: Self-pay | Admitting: Physical Therapy

## 2015-09-15 ENCOUNTER — Ambulatory Visit (INDEPENDENT_AMBULATORY_CARE_PROVIDER_SITE_OTHER): Payer: Medicare Other | Admitting: Rehabilitative and Restorative Service Providers"

## 2015-09-15 ENCOUNTER — Encounter: Payer: Self-pay | Admitting: Rehabilitative and Restorative Service Providers"

## 2015-09-15 DIAGNOSIS — R29898 Other symptoms and signs involving the musculoskeletal system: Secondary | ICD-10-CM | POA: Diagnosis not present

## 2015-09-15 DIAGNOSIS — Z7409 Other reduced mobility: Secondary | ICD-10-CM

## 2015-09-15 DIAGNOSIS — R6889 Other general symptoms and signs: Secondary | ICD-10-CM

## 2015-09-15 DIAGNOSIS — M25512 Pain in left shoulder: Secondary | ICD-10-CM | POA: Diagnosis present

## 2015-09-15 DIAGNOSIS — R531 Weakness: Secondary | ICD-10-CM

## 2015-09-15 DIAGNOSIS — M623 Immobility syndrome (paraplegic): Secondary | ICD-10-CM | POA: Diagnosis not present

## 2015-09-15 DIAGNOSIS — M2560 Stiffness of unspecified joint, not elsewhere classified: Secondary | ICD-10-CM

## 2015-09-15 NOTE — Therapy (Addendum)
[facility] [place], [place], [address] Phone: [phone]   Fax:  [phone]  Physical Therapy Evaluation  Patient Details  Name: Heidi Franklin MRN: [id number] Date of Birth: 1945/11/13 Referring Provider: Dr. Hulan Saas   Encounter Date: 09/15/2015      PT End of Session - 09/15/15 1155    Visit Number 1   Number of Visits 12   Date for PT Re-Evaluation 10/27/15   PT Start Time 1155   PT Stop Time 1255   PT Time Calculation (min) 60 min   Activity Tolerance Patient tolerated treatment well      Past Medical History  Diagnosis Date  . BRONCHITIS, CHRONIC   . ALLERGIC RHINITIS   . Anxiety state, unspecified   . DEPRESSION   . DIABETES MELLITUS, TYPE II   . GERD   . HYPERLIPIDEMIA   . HYPERTENSION     Past Surgical History  Procedure Laterality Date  . Abdominal hysterectomy  1980  . Trigger finger release      Index finger    There were no vitals filed for this visit.  Visit Diagnosis:  Pain in shoulder region, left - Plan: PT plan of care cert/re-cert  Stiffness due to immobility - Plan: PT plan of care cert/re-cert  Weakness of shoulder - Plan: PT plan of care cert/re-cert  Decreased strength, endurance, and mobility - Plan: PT plan of care cert/re-cert      Subjective Assessment - 09/15/15 1158    Subjective Patient reports Lt shoulder pain for the past 6 months with symptoms gradually increasing. She now has pain at night when she is trying to sleep. She notices some discomfort with lifting her arm to the side. Pain radiates down the Lt arm and limits functional use of Lt UE.    Pertinent History currently has a "crick' in her neck for the past week; arthritic changes in knees   How long can you sit comfortably? no limit   How long can you stand comfortably? no limit - can't carry items in LT UE    How long can you walk comfortably? no limit   Diagnostic tests xrays - MD  diagnosed bursitis    Patient Stated Goals get rid of pain    Currently in Pain? Yes   Pain Score 4    Pain Location Shoulder   Pain Orientation Left   Pain Type Chronic pain   Pain Radiating Towards deltoid area down Lt UE just below elbow intermittently    Pain Onset More than a month ago   Pain Frequency Intermittent   Aggravating Factors  lying down to sleep; reaching; lifting something heavy    Pain Relieving Factors analgesic cream; OTC antiinflammatory; icd some help             [facility] PT Assessment - 09/15/15 0001    Assessment   Medical Diagnosis Lt shoulder pain    Referring Provider Dr. Hulan Saas    Onset Date/Surgical Date 03/13/15   Hand Dominance Right   Next MD Visit 09/25/15   Prior Therapy none   Precautions   Precautions None   Balance Screen   Has the patient fallen in the past 6 months Yes   How many times? 4   Has the patient had a decrease in activity level because of a fear of falling?  No   Is the patient reluctant to leave their home because of a fear of falling?  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    Leisure household chores; walks ~ 2 x/wk 30 min not lately    Observation/Other Assessments   Focus on Therapeutic Outcomes (FOTO)  39% limitation    Sensation   Additional Comments WFL's    Posture/Postural Control   Posture Comments head forward; shoulders rounded; head of the humerus anterior in orientation; increased thoracic kyphosis; scapulae abducted and rotated along the thoracic wall    AROM   Right/Left Shoulder --  pan Lt shoulder flex/abd/ER/IR    Right Shoulder Extension 61 Degrees   Right Shoulder Flexion 148 Degrees   Right Shoulder ABduction 154 Degrees   Right Shoulder Internal Rotation 34 Degrees    Right Shoulder External Rotation 100 Degrees   Left Shoulder Extension 48 Degrees   Left Shoulder Flexion 126 Degrees   Left Shoulder ABduction 137 Degrees   Left Shoulder Internal Rotation 30 Degrees   Left Shoulder External Rotation 89 Degrees   Cervical Flexion 50   Cervical Extension 33   Cervical - Right Side Bend 34   Cervical - Left Side Bend 25   Cervical - Right Rotation 43   Cervical - Left Rotation 41   Strength   Right Shoulder Flexion 5/5   Right Shoulder Extension 5/5   Right Shoulder ABduction 5/5   Right Shoulder Internal Rotation 5/5   Right Shoulder External Rotation --  5-/5   Left Shoulder Flexion 4+/5   Left Shoulder Extension --  5-/5   Left Shoulder ABduction 4+/5   Left Shoulder Internal Rotation 4+/5   Left Shoulder External Rotation 4/5   Palpation   Palpation comment muscular tightness ant/lat/post cervical musculature; upper trap; leveator; pecs; biceps tendon area; deltoid                   [facility] Adult PT Treatment/Exercise - 09/15/15 0001    Therapeutic Activites    Therapeutic Activities --  myofacial ball release    Neuro Re-ed    Neuro Re-ed Details  working on posture and alignment engaging posterior shoulder girdle musculature    Shoulder Exercises: Standing   Other Standing Exercises scap squeeze with noodle 10 sec hold x 10 reps    Shoulder Exercises: Stretch   Corner Stretch Limitations doorway stretch 3 positions 30 sec x 3 reps      Moist heat cervical spine and Lt shoulder x 15 min IFC e-stim Lt shoulder to tolerance x 15 min            PT Education - 09/15/15 1230    Education provided Yes   Education Details postural correction; myofacial ball release work; Chiropractor) Educated Patient   Methods Explanation;Demonstration;Tactile cues;Verbal cues;Handout   Comprehension Verbalized understanding;Returned demonstration;Verbal cues required;Tactile cues required             PT Long Term Goals -  09/15/15 1256    PT LONG TERM GOAL #1   Title Improve posture and alignment with patient to demo more upright posture through head and shoulders 10/27/15   Time 6   Period Weeks   Status New   PT LONG TERM GOAL #2   Title Increase AROM Lt shoudler to equal or greater that of  Rt shoulder 10/27/15   Time 6   Period Weeks   Status New   PT LONG TERM GOAL #3   Title shoulder elevation to reach into chest height cabinet without pain 10/27/15   Time 6   Period Days   Status New   PT LONG TERM GOAL #4   Title sleep without awakening due to Lt shoulder pain 10/27/15   Time 6   Period Weeks   Status New   PT LONG TERM GOAL #5   Title Improve FOTO to </=32% limitation 10/27/15   Time 6   Period Weeks   Status New               Plan - 09/15/15 1250    Clinical Impression Statement Patient presents with Lt shoulder pain which has gradually increased in the past 6 months. There is no known cause of injury. Pt has poor posture and alignment; abnormal movement patterns UE's; limited cervical and shoulder ROM; decreased shouder strength; muscular tightness through Lt upper quarter; limited funcitonal activity level.  Signs and symptoms are consistent with early adhesive capsulitis and shoulder/cervical dysfunction. Patient will benefit form PT to address problems identified.    Pt will benefit from skilled therapeutic intervention in order to improve on the following deficits Postural dysfunction;Improper body mechanics;Increased fascial restricitons;Decreased range of motion;Decreased mobility;Decreased strength;Decreased activity tolerance;Decreased endurance;Pain   Rehab Potential Good   PT Frequency 2x / week   PT Duration 6 weeks   PT Treatment/Interventions Patient/family education;ADLs/Self Care Home Management;Therapeutic exercise;Therapeutic activities;Neuromuscular re-education;Manual techniques;Passive range of motion;Dry needling;Cryotherapy;Electrical Stimulation;Moist  Heat;Ultrasound   PT Next Visit Plan work on posture and alignment; pulley and other exercises to address limited shd ROM; postural strengthening; manual work   PT Home Exercise Plan postural correction; ball release work; provided information for purchase of TENS unit; HEP   Consulted and Agree with Plan of Care Patient         Problem List Patient Active Problem List   Diagnosis Date Noted  . Peroneal tendinitis of left lower extremity 09/05/2015  . Recurrent falls 08/08/2015  . Bursitis of left shoulder 07/13/2015  . Obesity 04/16/2015  . Nonspecific abnormal electrocardiogram (ECG) (EKG) 05/11/2011  . Tenosynovitis of finger 04/22/2011  . DIARRHEA 11/16/2010  . ANXIETY STATE, UNSPECIFIED 10/12/2010  . Type 2 diabetes, uncontrolled, with neuropathy ([place]) 03/02/2010  . Hyperlipidemia associated with type 2 diabetes mellitus ([place]) 03/02/2010  . Depression with anxiety 03/02/2010  . Essential hypertension 03/02/2010  . Chronic rhinitis 03/02/2010  . Upper airway cough syndrome 03/02/2010  . GERD 03/02/2010    Raynah Gomes Nilda Simmer  PT, MPH  09/15/2015, 1:10 PM  [facility] [address] [place], [place], [address] Phone: [phone]   Fax:  [phone]  Name: Heidi Franklin MRN: [id number] Date of Birth: 1946-01-22

## 2015-09-15 NOTE — Patient Instructions (Signed)
Using about a 4 inch rubber ball to massage sore spots in shoulder and arm  Several minutes a few times each day    Scapular Retraction (Standing)    With arms at sides, pinch shoulder blades down and back. Hold 10 sec Repeat __10__ times per set.  Do ___several _ sessions per day.   Scapula Adduction With Pectoralis Stretch: Low - Standing   Shoulders at 45 hands even with shoulders, keeping weight through legs, shift weight forward until you feel pull or stretch through the front of your chest. Hold _30__ seconds. Do _3__ times, _2-4__ times per day.   Scapula Adduction With Pectoralis Stretch: Mid-Range - Standing   Shoulders at 90 elbows even with shoulders, keeping weight through legs, shift weight forward until you feel pull or strength through the front of your chest. Hold __30_ seconds. Do _3__ times, __2-4_ times per day.   Scapula Adduction With Pectoralis Stretch: High - Standing   Shoulders at 120 hands up high on the doorway, keeping weight on feet, shift weight forward until you feel pull or stretch through the front of your chest. Hold _30__ seconds. Do _3__ times, _2-3__ times per day.   google frozen shoulder or adhesive capsulitis

## 2015-09-25 ENCOUNTER — Encounter: Payer: Medicare Other | Admitting: Rehabilitative and Restorative Service Providers"

## 2015-09-26 ENCOUNTER — Other Ambulatory Visit (INDEPENDENT_AMBULATORY_CARE_PROVIDER_SITE_OTHER): Payer: Medicare Other

## 2015-09-26 ENCOUNTER — Encounter: Payer: Self-pay | Admitting: Family Medicine

## 2015-09-26 ENCOUNTER — Ambulatory Visit (INDEPENDENT_AMBULATORY_CARE_PROVIDER_SITE_OTHER): Payer: Medicare Other | Admitting: Family Medicine

## 2015-09-26 VITALS — BP 126/72 | HR 100 | Ht 60.0 in | Wt 168.0 lb

## 2015-09-26 DIAGNOSIS — M7552 Bursitis of left shoulder: Secondary | ICD-10-CM

## 2015-09-26 DIAGNOSIS — M25512 Pain in left shoulder: Secondary | ICD-10-CM | POA: Diagnosis not present

## 2015-09-26 MED ORDER — MELOXICAM 15 MG PO TABS: 15.0000 mg | ORAL_TABLET | Freq: Every day | ORAL | Status: DC

## 2015-09-26 MED ORDER — TIZANIDINE HCL 2 MG PO TABS: 2.0000 mg | ORAL_TABLET | Freq: Every day | ORAL | Status: DC

## 2015-09-26 NOTE — Progress Notes (Signed)
[facility] [place], [place] [address] Phone: [phone] Subjective:      CC: Left foot and heel pain follow up  [id number] Heidi Franklin is a 69 y.o. female coming in with complaint of left foot and heel pain. 10 have more of a peroneal tendinitis. Patient was to do home exercises, bracing, icing. Patient states.left foot and the heel pain is doing significant better at this time.   Patient was previously seen and was diagnosed with more of a subacromial bursitis. Patient has been going to formal physical therapy and has made some improvement but continues to have pain especially waking her up at night. Patient did have significant benefit from the first injection and is wondering if she could've another one. No radiation and no weakness noted.   Past Medical History  Diagnosis Date  . BRONCHITIS, CHRONIC   . ALLERGIC RHINITIS   . Anxiety state, unspecified   . DEPRESSION   . DIABETES MELLITUS, TYPE II   . GERD   . HYPERLIPIDEMIA   . HYPERTENSION    Past Surgical History  Procedure Laterality Date  . Abdominal hysterectomy  1980  . Trigger finger release      Index finger   Social History  Substance Use Topics  . Smoking status: Never Smoker   . Smokeless tobacco: Never Used  . Alcohol Use: No   Allergies  Allergen Reactions  . Penicillins Rash   Family History  Problem Relation Age of Onset  . Stroke Mother   . Diabetes Father   . Hyperlipidemia Other     Parent  . Hypertension Other     Parent  . Colon cancer Neg Hx   . Esophageal cancer Neg Hx   . Rectal cancer Neg Hx   . Stomach cancer Neg Hx   . Pancreatic cancer Neg Hx         Past medical history, social, surgical and family history all reviewed in electronic medical record.   Review of Systems: No headache, visual changes, nausea, vomiting, diarrhea, constipation, dizziness, abdominal pain, skin rash, fevers, chills, night sweats, weight loss,  swollen lymph nodes, body aches, joint swelling, muscle aches, chest pain, shortness of breath, mood changes.   Objective Blood pressure 126/72, pulse 100, height 5' (1.524 m), weight 168 lb (76.204 kg), SpO2 98 %.  General: No apparent distress alert and oriented x3 mood and affect normal, dressed appropriately.  HEENT: Pupils equal, extraocular movements intact  Respiratory: Patient's speak in full sentences and does not appear short of breath  Cardiovascular: No lower extremity edema, non tender, no erythema  Skin: Warm dry intact with no signs of infection or rash on extremities or on axial skeleton.  Abdomen: Soft nontender  Neuro: Cranial nerves II through XII are intact, neurovascularly intact in all extremities with 2+ DTRs and 2+ pulses.  Lymph: No lymphadenopathy of posterior or anterior cervical chain or axillae bilaterally.  Gait normal with good balance and coordination.  MSK:  Non tender with full range of motion and good stability and symmetric strength and tone of  elbows, wrist, hip, knees bilaterally.  Shoulder: left Inspection reveals no abnormalities, atrophy or asymmetry. Palpation is normal with no tenderness over AC joint or bicipital groove. ROM is full in all planes passively. Rotator cuff strength normal throughout.  continued impingement signs noted No labral pathology noted with negative Obrien's, negative clunk and good stability. Normal scapular function observed. No painful arc and no drop  arm sign. No apprehension sign    Procedure: Real-time Ultrasound Guided Injection of left glenohumeral joint Device: GE Logiq E  Ultrasound guided injection is preferred based studies that show increased duration, increased effect, greater accuracy, decreased procedural pain, increased response rate with ultrasound guided versus blind injection.  Verbal informed consent obtained.  Time-out conducted.  Noted no overlying erythema, induration, or other signs of local  infection.  Skin prepped in a sterile fashion.  Local anesthesia: Topical Ethyl chloride.  With sterile technique and under real time ultrasound guidance:  Joint visualized.  23g 1  inch needle inserted posterior approach. Pictures taken for needle placement. Patient did have injection of 2 cc of 1% lidocaine, 2 cc of 0.5% Marcaine, and 1cc of Kenalog 40 mg/dL. Completed without difficulty  Pain immediately resolved suggesting accurate placement of the medication.  Advised to call if fevers/chills, erythema, induration, drainage, or persistent bleeding.  Images permanently stored and available for review in the ultrasound unit.  Impression: Technically successful ultrasound guided injection.        Impression and Recommendations:     This case required medical decision making of moderate complexity.

## 2015-09-26 NOTE — Patient Instructions (Signed)
Good to see you See OT they will help a lot Ice is your friend Continue the vitamins See me again in 4-5 weeks meloxicam daily for 5 days then as needed Flexeril 5mg  at night.  Happy holidays!

## 2015-09-26 NOTE — Progress Notes (Signed)
Pre visit review using our clinic review tool, if applicable. No additional management support is needed unless otherwise documented below in the visit note. 

## 2015-09-26 NOTE — Assessment & Plan Note (Addendum)
Patient given injection today and tolerated the procedure very well. We discussed icing regimen and home exercises. We discussed which activities to do an which was potentially avoid. Patient will finish up with formal physical therapy. We discussed the meloxicam. Given a very low dose of a muscle relaxer to take at night to help with sleeping. Patient was having difficulty with falls on other medications throughout the day so we will not increase any other potentially interacting medications. Patient will come back and see me again in 4 weeks. If continuing have pain advance imaging may be warranted.

## 2015-09-27 ENCOUNTER — Encounter: Payer: Self-pay | Admitting: Rehabilitative and Restorative Service Providers"

## 2015-09-27 ENCOUNTER — Ambulatory Visit (INDEPENDENT_AMBULATORY_CARE_PROVIDER_SITE_OTHER): Payer: Medicare Other | Admitting: Rehabilitative and Restorative Service Providers"

## 2015-09-27 DIAGNOSIS — R531 Weakness: Secondary | ICD-10-CM

## 2015-09-27 DIAGNOSIS — M623 Immobility syndrome (paraplegic): Secondary | ICD-10-CM

## 2015-09-27 DIAGNOSIS — R6889 Other general symptoms and signs: Secondary | ICD-10-CM

## 2015-09-27 DIAGNOSIS — M25512 Pain in left shoulder: Secondary | ICD-10-CM

## 2015-09-27 DIAGNOSIS — Z7409 Other reduced mobility: Secondary | ICD-10-CM | POA: Diagnosis not present

## 2015-09-27 DIAGNOSIS — R29898 Other symptoms and signs involving the musculoskeletal system: Secondary | ICD-10-CM | POA: Diagnosis not present

## 2015-09-27 DIAGNOSIS — M2560 Stiffness of unspecified joint, not elsewhere classified: Secondary | ICD-10-CM

## 2015-09-27 NOTE — Therapy (Signed)
[place], [place], [address] Phone: [phone]   Fax:  [phone]  Physical Therapy Treatment  Patient Details  Name: Heidi Franklin MRN: [id number] Date of Birth: November 26, 1945 Referring Provider: Dr. Charlann Boxer  Encounter Date: 09/27/2015      PT End of Session - 09/27/15 1027    Visit Number 2   Number of Visits 12   Date for PT Re-Evaluation 10/27/15   PT Start Time [id number]   PT Stop Time 1113   PT Time Calculation (min) 50 min   Activity Tolerance Patient tolerated treatment well;Patient limited by pain      Past Medical History  Diagnosis Date  . BRONCHITIS, CHRONIC   . ALLERGIC RHINITIS   . Anxiety state, unspecified   . DEPRESSION   . DIABETES MELLITUS, TYPE II   . GERD   . HYPERLIPIDEMIA   . HYPERTENSION     Past Surgical History  Procedure Laterality Date  . Abdominal hysterectomy  1980  . Trigger finger release      Index finger    There were no vitals filed for this visit.  Visit Diagnosis:  Pain in shoulder region, left  Stiffness due to immobility  Weakness of shoulder  Decreased strength, endurance, and mobility      Subjective Assessment - 09/27/15 1034    Subjective Seen by MD yesterday and received injection in Lt shd which may have helped some. She has not done any of the exercises she has been shown because the shoulder hurt. She has been using heat and ice for pain.    Currently in Pain? Yes   Pain Score 4    Pain Location Shoulder   Pain Orientation Left   Pain Descriptors / Indicators Aching   Pain Type Chronic pain   Pain Onset More than a month ago   Pain Frequency Intermittent            [facility] PT Assessment - 09/27/15 0001    Assessment   Medical Diagnosis Lt shoulder pain    Referring Provider Dr. Charlann Boxer   Onset Date/Surgical Date 03/13/15   Hand Dominance Right   Prior Therapy none   AROM   Left Shoulder Extension 51 Degrees   Left Shoulder Flexion 133 Degrees   Left Shoulder ABduction 140 Degrees   Left Shoulder Internal Rotation 34 Degrees   Left Shoulder External Rotation 88 Degrees                     [facility] Adult PT Treatment/Exercise - 09/27/15 0001    Posture/Postural Control   Posture Comments head forward; shoulders rounded; head of the humerus anterior in orientation; increased thoracic kyphosis; scapulae abducted and rotated along the thoracic wall    Neuro Re-ed    Neuro Re-ed Details  working on posture and alignment engaging posterior shoulder girdle musculature    Shoulder Exercises: Standing   Extension Both;20 reps;Theraband   Theraband Level (Shoulder Extension) Level 1 (Yellow)   Row Both;20 reps;Theraband   Theraband Level (Shoulder Row) Level 1 (Yellow)   Retraction Both;20 reps;Theraband   Theraband Level (Shoulder Retraction) Level 1 (Yellow)   Shoulder Exercises: Pulleys   Flexion --  10 sec x 10 Lt    Shoulder Exercises: ROM/Strengthening   UBE (Upper Arm Bike) L2 4 min alt fwd/back   Shoulder Exercises: Stretch   Corner Stretch Limitations doorway stretch 3 positions 30 sec x 3 reps  Moist Heat Therapy   Number Minutes Moist Heat 15 Minutes   Moist Heat Location Shoulder;Cervical   Electrical Stimulation   Electrical Stimulation Location Lt shoulder    Electrical Stimulation Action IFC   Electrical Stimulation Parameters to tolerance   Electrical Stimulation Goals Pain   Manual Therapy   Joint Mobilization GH inferior glides   Soft tissue mobilization Lt shd area   Scapular Mobilization Lt   Passive ROM Lt shd                      PT Long Term Goals - 09/27/15 1105    PT LONG TERM GOAL #1   Title Improve posture and alignment with patient to demo more upright posture through head and shoulders 10/27/15   Time 6   Period Weeks   Status On-going   PT LONG TERM GOAL #2   Title Increase AROM Lt shoudler to equal or greater that of Rt shoulder  10/27/15   Time 6   Period Weeks   Status On-going   PT LONG TERM GOAL #3   Title shoulder elevation to reach into chest height cabinet without pain 10/27/15   Time 6   Period Weeks   Status On-going   PT LONG TERM GOAL #4   Title sleep without awakening due to Lt shoulder pain 10/27/15   Time 6   Period Weeks   Status On-going   PT LONG TERM GOAL #5   Title Improve FOTO to </=32% limitation 10/27/15   Time 6   Period Weeks   Status On-going               Plan - 09/27/15 1104    Clinical Impression Statement Pt has not doen any exercise since initial visit and presents with continued pain. She did receive an injection in the Lt shd yesterday which seems to have helped some. Stressed the importance of consistent HEP and attending PT regularily to resolve problems.    Pt will benefit from skilled therapeutic intervention in order to improve on the following deficits Postural dysfunction;Improper body mechanics;Increased fascial restricitons;Decreased range of motion;Decreased mobility;Decreased strength;Decreased activity tolerance;Decreased endurance;Pain   Rehab Potential Good   PT Frequency 2x / week   PT Duration 6 weeks   PT Treatment/Interventions Patient/family education;ADLs/Self Care Home Management;Therapeutic exercise;Therapeutic activities;Neuromuscular re-education;Manual techniques;Passive range of motion;Dry needling;Cryotherapy;Electrical Stimulation;Moist Heat;Ultrasound   PT Next Visit Plan work on posture and alignment; pulley and other exercises to address limited shd ROM; postural strengthening; manual work   PT Home Exercise Plan postural correction; ball release work; provided information for purchase of TENS unit; HEP   Consulted and Agree with Plan of Care Patient        Problem List Patient Active Problem List   Diagnosis Date Noted  . Peroneal tendinitis of left lower extremity 09/05/2015  . Recurrent falls 08/08/2015  . Bursitis of left  shoulder 07/13/2015  . Obesity 04/16/2015  . Nonspecific abnormal electrocardiogram (ECG) (EKG) 05/11/2011  . Tenosynovitis of finger 04/22/2011  . DIARRHEA 11/16/2010  . ANXIETY STATE, UNSPECIFIED 10/12/2010  . Type 2 diabetes, uncontrolled, with neuropathy ([place]) 03/02/2010  . Hyperlipidemia associated with type 2 diabetes mellitus ([place]) 03/02/2010  . Depression with anxiety 03/02/2010  . Essential hypertension 03/02/2010  . Chronic rhinitis 03/02/2010  . Upper airway cough syndrome 03/02/2010  . GERD 03/02/2010    Heidi Franklin Nilda Simmer PT, MPH 09/27/2015, 11:08 AM  [place] [address]  [place], [place], [address] Phone: [phone]   Fax:  [phone]  Name: Heidi Franklin MRN: [id number] Date of Birth: 06/23/1946

## 2015-10-02 ENCOUNTER — Encounter: Payer: Self-pay | Admitting: Rehabilitative and Restorative Service Providers"

## 2015-10-02 ENCOUNTER — Ambulatory Visit (INDEPENDENT_AMBULATORY_CARE_PROVIDER_SITE_OTHER): Payer: Medicare Other | Admitting: Rehabilitative and Restorative Service Providers"

## 2015-10-02 DIAGNOSIS — Z7409 Other reduced mobility: Secondary | ICD-10-CM

## 2015-10-02 DIAGNOSIS — M25512 Pain in left shoulder: Secondary | ICD-10-CM

## 2015-10-02 DIAGNOSIS — M2560 Stiffness of unspecified joint, not elsewhere classified: Secondary | ICD-10-CM

## 2015-10-02 DIAGNOSIS — M623 Immobility syndrome (paraplegic): Secondary | ICD-10-CM | POA: Diagnosis not present

## 2015-10-02 DIAGNOSIS — R29898 Other symptoms and signs involving the musculoskeletal system: Secondary | ICD-10-CM | POA: Diagnosis not present

## 2015-10-02 DIAGNOSIS — R6889 Other general symptoms and signs: Secondary | ICD-10-CM

## 2015-10-02 DIAGNOSIS — R531 Weakness: Secondary | ICD-10-CM

## 2015-10-02 NOTE — Patient Instructions (Signed)
Scapula Adduction With Pectoralis Stretch: Low - Standing   Shoulders at 45 hands even with shoulders, keeping weight through legs, shift weight forward until you feel pull or stretch through the front of your chest. Hold _30__ seconds. Do _3__ times, _2-4__ times per day.   Scapula Adduction With Pectoralis Stretch: Mid-Range - Standing   Shoulders at 90 elbows even with shoulders, keeping weight through legs, shift weight forward until you feel pull or strength through the front of your chest. Hold __30_ seconds. Do _3__ times, __2-4_ times per day.   Scapula Adduction With Pectoralis Stretch: High - Standing   Shoulders at 120 hands up high on the doorway, keeping weight on feet, shift weight forward until you feel pull or stretch through the front of your chest. Hold _30__ seconds. Do _3__ times, _2-3__ times per day.  Resisted External Rotation: in Neutral - Bilateral   PALMS UP Sit or stand, tubing in both hands, elbows at sides, bent to 90, forearms forward. Pinch shoulder blades together and rotate forearms out. Keep elbows at sides. Repeat __10__ times per set. Do _2-3___ sets per session. Do _2-3___ sessions per day.   Low Row: Standing   Face anchor, feet shoulder width apart. Palms up, pull arms back, squeezing shoulder blades together. Repeat 10__ times per set. Do 2-3__ sets per session. Do 2-3__ sessions per week. Anchor Height: Waist   Strengthening: Resisted Extension   Hold tubing in right hand, arm forward. Pull arm back, elbow straight. Repeat _10___ times per set. Do 2-3____ sets per session. Do 2-3____ sessions per day.   Flexors Stretch, Standing    Stand near wall and slide arm up, with palm facing away from wall, by leaning toward wall. Hold _10__ seconds.  Repeat __5_ times per session. Do _3__ sessions per day.   External Rotator Cuff Stretch, Standing    Stand, hands clasped behind back. Pull elbows back as far as possible. Hold  _10__ seconds. Repeat _5__ times per session. Do _3__ sessions per day.

## 2015-10-02 NOTE — Therapy (Signed)
[place], [place], [address] Phone: [phone]   Fax:  [phone]  Physical Therapy Treatment  Patient Details  Name: Heidi Franklin MRN: [id number] Date of Birth: Jul 04, 1946 Referring Provider: Charlann Boxer  Encounter Date: 10/02/2015      PT End of Session - 10/02/15 1151    Visit Number 3   Number of Visits 12   Date for PT Re-Evaluation 10/27/15   PT Start Time [id number]   PT Stop Time 1239   PT Time Calculation (min) 48 min   Activity Tolerance Patient tolerated treatment well;Patient limited by pain      Past Medical History  Diagnosis Date  . BRONCHITIS, CHRONIC   . ALLERGIC RHINITIS   . Anxiety state, unspecified   . DEPRESSION   . DIABETES MELLITUS, TYPE II   . GERD   . HYPERLIPIDEMIA   . HYPERTENSION     Past Surgical History  Procedure Laterality Date  . Abdominal hysterectomy  1980  . Trigger finger release      Index finger    There were no vitals filed for this visit.  Visit Diagnosis:  Pain in shoulder region, left  Stiffness due to immobility  Weakness of shoulder  Decreased strength, endurance, and mobility      Subjective Assessment - 10/02/15 1152    Subjective Pain is some better; improving but still hurts. She is not doing her exercises at home.    Pain Score 5    Pain Location Shoulder   Pain Orientation Left   Pain Descriptors / Indicators Aching   Pain Type Chronic pain   Pain Onset More than a month ago   Pain Frequency Intermittent            [facility] PT Assessment - 10/02/15 0001    Assessment   Medical Diagnosis Lt shoulder pain    Referring Provider Charlann Boxer   Onset Date/Surgical Date 03/13/15   Hand Dominance Right   Prior Therapy none   AROM   Left Shoulder Flexion 135 Degrees   Left Shoulder ABduction 141 Degrees   Left Shoulder Internal Rotation 35 Degrees   Left Shoulder External Rotation 90 Degrees                      [facility] Adult PT Treatment/Exercise - 10/02/15 0001    Neuro Re-ed    Neuro Re-ed Details  working on posture and alignment engaging posterior shoulder girdle musculature    Shoulder Exercises: Standing   Extension Both;20 reps;Theraband   Theraband Level (Shoulder Extension) Level 1 (Yellow)   Row Both;20 reps;Theraband   Theraband Level (Shoulder Row) Level 1 (Yellow)   Retraction Both;20 reps;Theraband   Theraband Level (Shoulder Retraction) Level 1 (Yellow)   Other Standing Exercises scap squeeze with noodle 10 sec hold x 10 reps    Other Standing Exercises ER stretch hands clasp behind back pulling elbows back 10 sec x 5    Shoulder Exercises: Pulleys   Flexion --  10 sec x 10 Lt    Shoulder Exercises: ROM/Strengthening   UBE (Upper Arm Bike) L2 4 min alt fwd/back   Shoulder Exercises: Stretch   Wall Stretch - Flexion 5 reps;10 seconds   Moist Heat Therapy   Number Minutes Moist Heat 15 Minutes   Moist Heat Location Shoulder;Cervical   Electrical Stimulation   Electrical Stimulation Location Lt shoulder    Electrical Stimulation Action IFC   Electrical Stimulation Parameters  to tolerance   Electrical Stimulation Goals Pain   Manual Therapy   Manual therapy comments pt supine   Joint Mobilization GH inferior glides   Soft tissue mobilization Lt shd area   Scapular Mobilization Lt   Passive ROM Lt shd                 PT Education - 10/02/15 1211    Education provided Yes   Education Details encouraged consistent HEP; HEP   Person(s) Educated Patient   Methods Explanation;Demonstration;Tactile cues;Verbal cues;Handout   Comprehension Verbalized understanding;Returned demonstration;Verbal cues required;Tactile cues required             PT Long Term Goals - 09/27/15 1105    PT LONG TERM GOAL #1   Title Improve posture and alignment with patient to demo more upright posture through head and shoulders 10/27/15   Time 6    Period Weeks   Status On-going   PT LONG TERM GOAL #2   Title Increase AROM Lt shoudler to equal or greater that of Rt shoulder 10/27/15   Time 6   Period Weeks   Status On-going   PT LONG TERM GOAL #3   Title shoulder elevation to reach into chest height cabinet without pain 10/27/15   Time 6   Period Weeks   Status On-going   PT LONG TERM GOAL #4   Title sleep without awakening due to Lt shoulder pain 10/27/15   Time 6   Period Weeks   Status On-going   PT LONG TERM GOAL #5   Title Improve FOTO to </=32% limitation 10/27/15   Time 6   Period Weeks   Status On-going               Plan - 10/02/15 1234    Clinical Impression Statement Improving mobility/ROM and decreased pain per pt report. She has not been very consistent with HEP and was encouraged to exercise regularly at home. Progressing toward stated goals of therapy.    Pt will benefit from skilled therapeutic intervention in order to improve on the following deficits Postural dysfunction;Improper body mechanics;Increased fascial restricitons;Decreased range of motion;Decreased mobility;Decreased strength;Decreased activity tolerance;Decreased endurance;Pain   Rehab Potential Good   PT Frequency 2x / week   PT Duration 6 weeks   PT Treatment/Interventions Patient/family education;ADLs/Self Care Home Management;Therapeutic exercise;Therapeutic activities;Neuromuscular re-education;Manual techniques;Passive range of motion;Dry needling;Cryotherapy;Electrical Stimulation;Moist Heat;Ultrasound   PT Next Visit Plan work on posture and alignment; exercises to address limited shd ROM; postural strengthening; manual work   PT Home Exercise Plan postural correction; HEP   Consulted and Agree with Plan of Care Patient        Problem List Patient Active Problem List   Diagnosis Date Noted  . Peroneal tendinitis of left lower extremity 09/05/2015  . Recurrent falls 08/08/2015  . Bursitis of left shoulder 07/13/2015  .  Obesity 04/16/2015  . Nonspecific abnormal electrocardiogram (ECG) (EKG) 05/11/2011  . Tenosynovitis of finger 04/22/2011  . DIARRHEA 11/16/2010  . ANXIETY STATE, UNSPECIFIED 10/12/2010  . Type 2 diabetes, uncontrolled, with neuropathy ([place]) 03/02/2010  . Hyperlipidemia associated with type 2 diabetes mellitus ([place]) 03/02/2010  . Depression with anxiety 03/02/2010  . Essential hypertension 03/02/2010  . Chronic rhinitis 03/02/2010  . Upper airway cough syndrome 03/02/2010  . GERD 03/02/2010    Sydney Hasten Nilda Simmer PT, MPH  10/02/2015, 12:37 PM  [address] [facility] [place], [place], [address] Phone: [phone]   Fax:  [phone]  Name: Alevia Hanaway MRN: [id number] Date of Birth: 1946-03-22

## 2015-10-05 ENCOUNTER — Encounter: Payer: Self-pay | Admitting: Rehabilitative and Restorative Service Providers"

## 2015-10-05 ENCOUNTER — Other Ambulatory Visit: Payer: Self-pay | Admitting: Internal Medicine

## 2015-10-05 ENCOUNTER — Ambulatory Visit (INDEPENDENT_AMBULATORY_CARE_PROVIDER_SITE_OTHER): Payer: Medicare Other | Admitting: Rehabilitative and Restorative Service Providers"

## 2015-10-05 DIAGNOSIS — M2560 Stiffness of unspecified joint, not elsewhere classified: Secondary | ICD-10-CM

## 2015-10-05 DIAGNOSIS — M25512 Pain in left shoulder: Secondary | ICD-10-CM

## 2015-10-05 DIAGNOSIS — Z7409 Other reduced mobility: Secondary | ICD-10-CM | POA: Diagnosis not present

## 2015-10-05 DIAGNOSIS — R29898 Other symptoms and signs involving the musculoskeletal system: Secondary | ICD-10-CM

## 2015-10-05 DIAGNOSIS — M623 Immobility syndrome (paraplegic): Secondary | ICD-10-CM | POA: Diagnosis not present

## 2015-10-05 DIAGNOSIS — R6889 Other general symptoms and signs: Secondary | ICD-10-CM

## 2015-10-05 DIAGNOSIS — R531 Weakness: Secondary | ICD-10-CM

## 2015-10-05 NOTE — Patient Instructions (Signed)
AROM: Lateral Neck Flexion   Keep chin tucked and chest up.  Slowly tilt head toward one shoulder, then the other. Hold each position __20__ seconds. Repeat __3-5__ times per set. Do __3-4__ sessions per day.  http://orth.exer.us/297   Copyright  VHI. All rights reserved.

## 2015-10-05 NOTE — Therapy (Signed)
[facility]  [name], [place], [address] Phone: [phone]   Fax:  [phone]  Physical Therapy Treatment  Patient Details  Name: Heidi Franklin MRN: [id number] Date of Birth: 05-22-1946 Referring Provider: Charlann Boxer  Encounter Date: 10/05/2015      PT End of Session - 10/05/15 0935    Visit Number 4   Number of Visits 12   Date for PT Re-Evaluation 10/27/15   PT Start Time 0933   PT Stop Time 1022   PT Time Calculation (min) 49 min      Past Medical History  Diagnosis Date  . BRONCHITIS, CHRONIC   . ALLERGIC RHINITIS   . Anxiety state, unspecified   . DEPRESSION   . DIABETES MELLITUS, TYPE II   . GERD   . HYPERLIPIDEMIA   . HYPERTENSION     Past Surgical History  Procedure Laterality Date  . Abdominal hysterectomy  1980  . Trigger finger release      Index finger    There were no vitals filed for this visit.  Visit Diagnosis:  Pain in shoulder region, left  Stiffness due to immobility  Weakness of shoulder  Decreased strength, endurance, and mobility      Subjective Assessment - 10/05/15 0935    Subjective Improving overall. Still having some pain at times - usually worse at night. Muscle in her neck bothers her.    Currently in Pain? Yes   Pain Score 3    Pain Location Shoulder   Pain Orientation Left   Pain Descriptors / Indicators Aching   Pain Radiating Towards intermittently into the Lt arm - up into the Lt side of her neck                          [facility] Adult PT Treatment/Exercise - 10/05/15 0001    Neuro Re-ed    Neuro Re-ed Details  working on posture and alignment engaging posterior shoulder girdle musculature    Shoulder Exercises: Seated   Other Seated Exercises lat cervical flexion stretch 10 sec x 5 to Rt    Shoulder Exercises: Standing   External Rotation Left;Strengthening;20 reps;Theraband   Theraband Level (Shoulder External Rotation) Level  1 (Yellow)   Extension Both;20 reps;Theraband   Theraband Level (Shoulder Extension) Level 2 (Red)   Row Both;20 reps;Theraband   Theraband Level (Shoulder Row) Level 2 (Red)   Retraction Both;20 reps;Theraband   Theraband Level (Shoulder Retraction) Level 1 (Yellow)   Other Standing Exercises scap squeeze with noodle 10 sec hold x 10 reps    Other Standing Exercises ER stretch hands clasp behind back pulling elbows back 10 sec x 5    Shoulder Exercises: Pulleys   Flexion --  10 sec x 10 Lt    Shoulder Exercises: ROM/Strengthening   UBE (Upper Arm Bike) L2 4 min alt fwd/back   Shoulder Exercises: Stretch   Corner Stretch Limitations doorway stretch 3 positions 30 sec x 3 reps    Wall Stretch - Flexion 5 reps;10 seconds   Moist Heat Therapy   Number Minutes Moist Heat 15 Minutes   Moist Heat Location Shoulder;Cervical   Electrical Stimulation   Electrical Stimulation Location Lt shoulder    Electrical Stimulation Action IFC   Electrical Stimulation Parameters to tolerance   Electrical Stimulation Goals Pain   Manual Therapy   Manual therapy comments pt supine   Joint Mobilization GH inferior glides   Soft tissue  mobilization Lt shd area   Scapular Mobilization Lt   Passive ROM Lt shd                 PT Education - 10/05/15 1002    Education provided Yes   Education Details encouraged consistent HEP - even wit ha few exercises; HEP    Person(s) Educated Patient   Methods Explanation;Demonstration;Tactile cues;Verbal cues;Handout   Comprehension Verbalized understanding;Returned demonstration;Verbal cues required;Tactile cues required             PT Long Term Goals - 10/05/15 0940    PT LONG TERM GOAL #1   Title Improve posture and alignment with patient to demo more upright posture through head and shoulders 10/27/15   Time 6   Period Weeks   Status On-going   PT LONG TERM GOAL #2   Title Increase AROM Lt shoudler to equal or greater that of Rt shoulder  10/27/15   Time 6   Period Weeks   Status On-going   PT LONG TERM GOAL #3   Title shoulder elevation to reach into chest height cabinet without pain 10/27/15   Time 6   Period Weeks   Status Achieved   PT LONG TERM GOAL #4   Title sleep without awakening due to Lt shoulder pain 10/27/15   Time 6   Period Weeks   Status Achieved   PT LONG TERM GOAL #5   Title Improve FOTO to </=32% limitation 10/27/15   Time 6   Period Weeks   Status On-going               Plan - 10/05/15 [id number]    Clinical Impression Statement Decreasing pain and improving mobility. Continued symptoms but overall improvement. Doing exercises at home "a little". Has been busy and tired. Encouraged consistent HEP. Progressing gradually toward stated goals of therapy.  Achieved goals 3 and 4.    Pt will benefit from skilled therapeutic intervention in order to improve on the following deficits Postural dysfunction;Improper body mechanics;Increased fascial restricitons;Decreased range of motion;Decreased mobility;Decreased strength;Decreased activity tolerance;Decreased endurance;Pain   Rehab Potential Good   PT Frequency 2x / week   PT Duration 6 weeks   PT Treatment/Interventions Patient/family education;ADLs/Self Care Home Management;Therapeutic exercise;Therapeutic activities;Neuromuscular re-education;Manual techniques;Passive range of motion;Dry needling;Cryotherapy;Electrical Stimulation;Moist Heat;Ultrasound   PT Next Visit Plan work on posture and alignment; exercises to address limited shd ROM; postural strengthening; manual work   PT Home Exercise Plan postural correction; HEP   Consulted and Agree with Plan of Care Patient        Problem List Patient Active Problem List   Diagnosis Date Noted  . Peroneal tendinitis of left lower extremity 09/05/2015  . Recurrent falls 08/08/2015  . Bursitis of left shoulder 07/13/2015  . Obesity 04/16/2015  . Nonspecific abnormal electrocardiogram (ECG) (EKG)  05/11/2011  . Tenosynovitis of finger 04/22/2011  . DIARRHEA 11/16/2010  . ANXIETY STATE, UNSPECIFIED 10/12/2010  . Type 2 diabetes, uncontrolled, with neuropathy ([place]) 03/02/2010  . Hyperlipidemia associated with type 2 diabetes mellitus ([place]) 03/02/2010  . Depression with anxiety 03/02/2010  . Essential hypertension 03/02/2010  . Chronic rhinitis 03/02/2010  . Upper airway cough syndrome 03/02/2010  . GERD 03/02/2010    Adden Strout Nilda Simmer PT, MPH  10/05/2015, 10:16 AM  [facility] [place], [place], [address] Phone: [phone]   Fax:  [phone]  Name: Heidi Franklin MRN: [id number] Date of Birth: November 23, 1945

## 2015-10-10 ENCOUNTER — Encounter: Payer: Self-pay | Admitting: Rehabilitative and Restorative Service Providers"

## 2015-10-10 ENCOUNTER — Ambulatory Visit (INDEPENDENT_AMBULATORY_CARE_PROVIDER_SITE_OTHER): Payer: Medicare Other | Admitting: Rehabilitative and Restorative Service Providers"

## 2015-10-10 DIAGNOSIS — M2560 Stiffness of unspecified joint, not elsewhere classified: Secondary | ICD-10-CM

## 2015-10-10 DIAGNOSIS — M25512 Pain in left shoulder: Secondary | ICD-10-CM

## 2015-10-10 DIAGNOSIS — R29898 Other symptoms and signs involving the musculoskeletal system: Secondary | ICD-10-CM | POA: Diagnosis not present

## 2015-10-10 DIAGNOSIS — M623 Immobility syndrome (paraplegic): Secondary | ICD-10-CM

## 2015-10-10 DIAGNOSIS — R6889 Other general symptoms and signs: Secondary | ICD-10-CM

## 2015-10-10 DIAGNOSIS — Z7409 Other reduced mobility: Secondary | ICD-10-CM

## 2015-10-10 DIAGNOSIS — R531 Weakness: Secondary | ICD-10-CM

## 2015-10-10 NOTE — Therapy (Signed)
[address] [place], [place], [address] Phone: [phone]   Fax:  [phone]  Physical Therapy Treatment  Patient Details  Name: Heidi Franklin MRN: [id number] Date of Birth: [birth year] Referring Provider: Charlann Boxer  Encounter Date: 10/10/2015      PT End of Session - 10/10/15 1151    Visit Number 5   Date for PT Re-Evaluation 10/27/15   PT Start Time 1151  patient 6 min late    PT Stop Time 1243   PT Time Calculation (min) 52 min   Activity Tolerance Patient tolerated treatment well      Past Medical History  Diagnosis Date  . BRONCHITIS, CHRONIC   . ALLERGIC RHINITIS   . Anxiety state, unspecified   . DEPRESSION   . DIABETES MELLITUS, TYPE II   . GERD   . HYPERLIPIDEMIA   . HYPERTENSION     Past Surgical History  Procedure Laterality Date  . Abdominal hysterectomy  1980  . Trigger finger release      Index finger    There were no vitals filed for this visit.  Visit Diagnosis:  Pain in shoulder region, left  Stiffness due to immobility  Weakness of shoulder  Decreased strength, endurance, and mobility      Subjective Assessment - 10/10/15 1153    Subjective Doing well until Sunday when she woke up Sunday night with her Lt neck hurting. She does not know of anything different she may have done that would have irritated symptoms. Symptoms were going down the shoulder some.    Currently in Pain? No/denies            [facility] PT Assessment - 10/10/15 0001    Assessment   Medical Diagnosis Lt shoulder pain    Referring Provider Charlann Boxer   Onset Date/Surgical Date 03/13/15   Hand Dominance Right   PROM   Overall PROM Comments supine PROM Lt shoulder flex 165 deg; ER at 90 deg abduction 90 deg                      [facility] Adult PT Treatment/Exercise - 10/10/15 0001    Neuro Re-ed    Neuro Re-ed Details  working on posture and alignment engaging posterior shoulder  girdle musculature    Shoulder Exercises: Seated   Other Seated Exercises lat cervical flexion stretch 10 sec x 5 to Rt    Shoulder Exercises: Standing   External Rotation Left;Strengthening;20 reps;Theraband   Theraband Level (Shoulder External Rotation) Level 1 (Yellow)   Extension Both;20 reps;Theraband   Theraband Level (Shoulder Extension) Level 2 (Red)   Row Both;20 reps;Theraband   Theraband Level (Shoulder Row) Level 2 (Red)   Retraction Both;20 reps;Theraband   Theraband Level (Shoulder Retraction) Level 1 (Yellow)   Other Standing Exercises scap squeeze with noodle 10 sec hold x 10 reps    Other Standing Exercises ER stretch hands clasp behind back pulling elbows back 10 sec x 5    Shoulder Exercises: ROM/Strengthening   UBE (Upper Arm Bike) L3 4 min alt fwd/back   Shoulder Exercises: Stretch   Corner Stretch Limitations doorway stretch 3 positions 30 sec x 3 reps    Wall Stretch - Flexion 5 reps;10 seconds   Other Shoulder Stretches supine snow angle with and without noodle 4 min total     Moist Heat Therapy   Number Minutes Moist Heat 15 Minutes   Moist Heat Location Shoulder;Cervical   Electrical  Stimulation   Electrical Stimulation Location Lt shoulder    Electrical Stimulation Action IFC   Electrical Stimulation Parameters to tolerance   Electrical Stimulation Goals Pain   Manual Therapy   Manual therapy comments pt supine   Joint Mobilization GH inferior glides   Soft tissue mobilization Lt shd area/ Lt cervical musculature   Scapular Mobilization Lt   Passive ROM Lt shd                 PT Education - 10/10/15 1241    Education provided Yes   Education Details education re- positioning of head - avoiding prolonged positions toward either side; encouraged consistent HEP   Person(s) Educated Patient   Methods Explanation;Demonstration;Tactile cues;Verbal cues;Handout   Comprehension Verbalized understanding;Returned demonstration;Verbal cues  required;Tactile cues required             PT Long Term Goals - 10/05/15 0940    PT LONG TERM GOAL #1   Title Improve posture and alignment with patient to demo more upright posture through head and shoulders 10/27/15   Time 6   Period Weeks   Status On-going   PT LONG TERM GOAL #2   Title Increase AROM Lt shoudler to equal or greater that of Rt shoulder 10/27/15   Time 6   Period Weeks   Status On-going   PT LONG TERM GOAL #3   Title shoulder elevation to reach into chest height cabinet without pain 10/27/15   Time 6   Period Weeks   Status Achieved   PT LONG TERM GOAL #4   Title sleep without awakening due to Lt shoulder pain 10/27/15   Time 6   Period Weeks   Status Achieved   PT LONG TERM GOAL #5   Title Improve FOTO to </=32% limitation 10/27/15   Time 6   Period Weeks   Status On-going               Plan - 10/10/15 1244    Clinical Impression Statement Increased pain and tightness in the Lt side of her neck in the past few days. Educated patient re- posture and alignment; the nature of musculoskeletal problems; HEP.    Pt will benefit from skilled therapeutic intervention in order to improve on the following deficits Postural dysfunction;Improper body mechanics;Increased fascial restricitons;Decreased range of motion;Decreased mobility;Decreased strength;Decreased activity tolerance;Decreased endurance;Pain   Rehab Potential Good   PT Frequency 2x / week   PT Duration 6 weeks   PT Treatment/Interventions Patient/family education;ADLs/Self Care Home Management;Therapeutic exercise;Therapeutic activities;Neuromuscular re-education;Manual techniques;Passive range of motion;Dry needling;Cryotherapy;Electrical Stimulation;Moist Heat;Ultrasound   PT Next Visit Plan work on posture and alignment; exercises to address limited shd ROM; postural strengthening; manual work; address cervical problems   PT Home Exercise Plan postural correction; HEP   Consulted and  Agree with Plan of Care Patient        Problem List Patient Active Problem List   Diagnosis Date Noted  . Peroneal tendinitis of left lower extremity 09/05/2015  . Recurrent falls 08/08/2015  . Bursitis of left shoulder 07/13/2015  . Obesity 04/16/2015  . Nonspecific abnormal electrocardiogram (ECG) (EKG) 05/11/2011  . Tenosynovitis of finger 04/22/2011  . DIARRHEA 11/16/2010  . ANXIETY STATE, UNSPECIFIED 10/12/2010  . Type 2 diabetes, uncontrolled, with neuropathy ([place]) 03/02/2010  . Hyperlipidemia associated with type 2 diabetes mellitus ([place]) 03/02/2010  . Depression with anxiety 03/02/2010  . Essential hypertension 03/02/2010  . Chronic rhinitis 03/02/2010  . Upper airway cough syndrome 03/02/2010  . GERD  03/02/2010    Heidi Franklin Nilda Simmer PT, MPH   10/10/2015, 12:52 PM  [facility] [place], [place], [address] Phone: [phone]   Fax:  [phone]  Name: Heidi Franklin MRN: [id number] Date of Birth: [birth year]

## 2015-10-10 NOTE — Patient Instructions (Signed)
Lying on back with hips and knees bent      Rest arms out to side at about 90 degrees from body. Try to stay in this position for 5 minutes at least once a day. Lying on the back is the position of least compression on the bones and discs of the spine, and helps to re-align the natural curves of the back.

## 2015-10-14 ENCOUNTER — Emergency Department (HOSPITAL_BASED_OUTPATIENT_CLINIC_OR_DEPARTMENT_OTHER): Payer: Medicare Other

## 2015-10-14 ENCOUNTER — Emergency Department: Admission: EM | Admit: 2015-10-14 | Discharge: 2015-10-14 | Payer: Medicare Other | Source: Home / Self Care

## 2015-10-14 ENCOUNTER — Encounter (HOSPITAL_BASED_OUTPATIENT_CLINIC_OR_DEPARTMENT_OTHER): Payer: Self-pay | Admitting: Emergency Medicine

## 2015-10-14 ENCOUNTER — Emergency Department (HOSPITAL_BASED_OUTPATIENT_CLINIC_OR_DEPARTMENT_OTHER)
Admission: EM | Admit: 2015-10-14 | Discharge: 2015-10-14 | Disposition: A | Discharge status: Home / Self Care | Payer: Medicare Other | Attending: Emergency Medicine | Admitting: Emergency Medicine

## 2015-10-14 DIAGNOSIS — Z7982 Long term (current) use of aspirin: Secondary | ICD-10-CM | POA: Diagnosis not present

## 2015-10-14 DIAGNOSIS — F329 Major depressive disorder, single episode, unspecified: Secondary | ICD-10-CM | POA: Diagnosis not present

## 2015-10-14 DIAGNOSIS — G8929 Other chronic pain: Secondary | ICD-10-CM | POA: Insufficient documentation

## 2015-10-14 DIAGNOSIS — F419 Anxiety disorder, unspecified: Secondary | ICD-10-CM | POA: Diagnosis not present

## 2015-10-14 DIAGNOSIS — Z791 Long term (current) use of non-steroidal anti-inflammatories (NSAID): Secondary | ICD-10-CM | POA: Insufficient documentation

## 2015-10-14 DIAGNOSIS — Z7951 Long term (current) use of inhaled steroids: Secondary | ICD-10-CM | POA: Diagnosis not present

## 2015-10-14 DIAGNOSIS — Z880 Allergy status to penicillin: Secondary | ICD-10-CM | POA: Diagnosis not present

## 2015-10-14 DIAGNOSIS — M79602 Pain in left arm: Secondary | ICD-10-CM | POA: Diagnosis not present

## 2015-10-14 DIAGNOSIS — Z8709 Personal history of other diseases of the respiratory system: Secondary | ICD-10-CM | POA: Insufficient documentation

## 2015-10-14 DIAGNOSIS — E119 Type 2 diabetes mellitus without complications: Secondary | ICD-10-CM | POA: Insufficient documentation

## 2015-10-14 DIAGNOSIS — K219 Gastro-esophageal reflux disease without esophagitis: Secondary | ICD-10-CM | POA: Diagnosis not present

## 2015-10-14 DIAGNOSIS — Z79899 Other long term (current) drug therapy: Secondary | ICD-10-CM | POA: Insufficient documentation

## 2015-10-14 DIAGNOSIS — R0789 Other chest pain: Secondary | ICD-10-CM | POA: Diagnosis not present

## 2015-10-14 DIAGNOSIS — I10 Essential (primary) hypertension: Secondary | ICD-10-CM | POA: Insufficient documentation

## 2015-10-14 DIAGNOSIS — E785 Hyperlipidemia, unspecified: Secondary | ICD-10-CM | POA: Insufficient documentation

## 2015-10-14 DIAGNOSIS — R079 Chest pain, unspecified: Secondary | ICD-10-CM | POA: Diagnosis present

## 2015-10-14 LAB — BASIC METABOLIC PANEL
Anion gap: 7 (ref 5–15)
BUN: 21 mg/dL — ABNORMAL HIGH (ref 6–20)
CO2: 28 mmol/L (ref 22–32)
Calcium: 9 mg/dL (ref 8.9–10.3)
Chloride: 104 mmol/L (ref 101–111)
Creatinine, Ser: 0.8 mg/dL (ref 0.44–1.00)
GFR calc Af Amer: >60 mL/min (ref >60)
GFR calc non Af Amer: >60 mL/min (ref >60)
Glucose, Bld: 117 mg/dL — ABNORMAL HIGH (ref 65–99)
Potassium: 3.6 mmol/L (ref 3.5–5.1)
Sodium: 139 mmol/L (ref 135–145)

## 2015-10-14 LAB — CBC
HCT: 35.7 % — ABNORMAL LOW (ref 36.0–46.0)
Hemoglobin: 11.4 g/dL — ABNORMAL LOW (ref 12.0–15.0)
MCH: 27.6 pg (ref 26.0–34.0)
MCHC: 31.9 g/dL (ref 30.0–36.0)
MCV: 86.4 fL (ref 78.0–100.0)
Platelets: 256 10*3/uL (ref 150–400)
RBC: 4.13 MIL/uL (ref 3.87–5.11)
RDW: 14 % (ref 11.5–15.5)
WBC: 10.1 10*3/uL (ref 4.0–10.5)

## 2015-10-14 LAB — TROPONIN I: Troponin I: <0.03 ng/mL (ref <0.031)

## 2015-10-14 MED ORDER — PANTOPRAZOLE SODIUM 40 MG IV SOLR
40.0000 mg | Freq: Once | INTRAVENOUS | Status: AC
  Administered 2015-10-14: 40 mg via INTRAVENOUS
  Filled 2015-10-14: qty 40

## 2015-10-14 MED ORDER — RANITIDINE HCL 150 MG PO CAPS: 150.0000 mg | ORAL_CAPSULE | Freq: Two times a day (BID) | ORAL | Status: DC

## 2015-10-14 NOTE — ED Notes (Signed)
Pt reports intermittent upper chest pain, worse with food, lots of belching

## 2015-10-14 NOTE — ED Notes (Signed)
Pt reports chest heavness x 1 week associated with left arm pain, pt states she has chronic left arm pain and is not sure if the chest heaviness is associated with arm pain, denies sob, +nausea, vomiting, denies back pain

## 2015-10-14 NOTE — ED Provider Notes (Signed)
CSN: [id number]     Arrival date & time 10/14/15  1155 History   First MD Initiated Contact with Patient 10/14/15 1211     Chief Complaint  Patient presents with  . Chest Pain      HPI Patient has had intermittent chest pain and discomfort precipitated by eating.  No pain now.  Pain also associated with left arm discomfort which is chronic for her.  No diaphoresis.  Patient has no history of heart disease. Past Medical History  Diagnosis Date  . BRONCHITIS, CHRONIC   . ALLERGIC RHINITIS   . Anxiety state, unspecified   . DEPRESSION   . DIABETES MELLITUS, TYPE II   . GERD   . HYPERLIPIDEMIA   . HYPERTENSION    Past Surgical History  Procedure Laterality Date  . Abdominal hysterectomy  1980  . Trigger finger release      Index finger   Family History  Problem Relation Age of Onset  . Stroke Mother   . Diabetes Father   . Hyperlipidemia Other     Parent  . Hypertension Other     Parent  . Colon cancer Neg Hx   . Esophageal cancer Neg Hx   . Rectal cancer Neg Hx   . Stomach cancer Neg Hx   . Pancreatic cancer Neg Hx    Social History  Substance Use Topics  . Smoking status: Never Smoker   . Smokeless tobacco: Never Used  . Alcohol Use: No   OB History    No data available     Review of Systems  Belching All other systems reviewed and are negative  Allergies  Penicillins  Home Medications   Prior to Admission medications   Medication Sig Start Date End Date Taking? Authorizing Provider  ALPRAZolam Duanne Moron) 0.5 MG tablet Take 1 tablet by mouth every 8 hours if needed for anxiety. 06/15/15  Yes Rowe Clack, MD  amLODipine (NORVASC) 10 MG tablet Take 1 tablet (10 mg total) by mouth daily. Patient taking differently: Take 10 mg by mouth every morning.  04/26/15  Yes Rowe Clack, MD  famotidine (PEPCID) 20 MG tablet Take 1 tablet (20 mg total) by mouth at bedtime. 06/15/15  Yes Rowe Clack, MD  fluticasone (FLONASE) 50 MCG/ACT nasal spray  Place 1 spray into both nostrils 2 (two) times daily. 08/01/14  Yes Rowe Clack, MD  lovastatin (MEVACOR) 20 MG tablet Take 1 tablet (20 mg total) by mouth at bedtime. 03/06/15  Yes Rowe Clack, MD  meloxicam (MOBIC) 15 MG tablet Take 1 tablet (15 mg total) by mouth daily. 09/26/15  Yes Lyndal Pulley, DO  potassium chloride SA (K-DUR,KLOR-CON) 20 MEQ tablet Take 1 tablet (20 mEq total) by mouth daily. 10/05/15  Yes Rowe Clack, MD  Respiratory Therapy Supplies (FLUTTER) DEVI Use as directed 08/03/15  Yes Tanda Rockers, MD  sertraline (ZOLOFT) 100 MG tablet Take 1 tablet (100 mg total) by mouth every morning. 06/15/15  Yes Rowe Clack, MD  tiZANidine (ZANAFLEX) 2 MG tablet Take 1 tablet (2 mg total) by mouth at bedtime. 09/26/15  Yes Lyndal Pulley, DO  valsartan-hydrochlorothiazide (DIOVAN-HCT) 160-25 MG tablet TAKE 1 TABLET EVERY DAY 10/05/15  Yes Tanda Rockers, MD  albuterol (PROVENTIL HFA;VENTOLIN HFA) 108 (90 BASE) MCG/ACT inhaler Inhale 2 puffs into the lungs every 4 (four) hours as needed for wheezing or shortness of breath.    Historical Provider, MD  aspirin 81 MG tablet Take 81  mg by mouth every morning.     Historical Provider, MD  Calcium Citrate-Vitamin D (CALCIUM + D PO) Take 1 tablet by mouth every morning.    Historical Provider, MD  chlorpheniramine (CHLOR-TRIMETON) 4 MG tablet Take 2 tabs by mouth at bedtime and every 4 hours as needed for drainage/drippy nose/sneezing    Historical Provider, MD  dextromethorphan (DELSYM) 30 MG/5ML liquid Take 2 tsp every 12 hours as needed with flutter valve for cough    Historical Provider, MD  glucose blood (ONE TOUCH ULTRA TEST) test strip 1 each by Other route daily. Use 1 strips to check blood sugar daily. Dx 250.00 06/10/12   Rowe Clack, MD  Lancets Glory Rosebush ULTRASOFT) lancets PRN 03/13/11   Rowe Clack, MD  metFORMIN (GLUCOPHAGE) 500 MG tablet Take 500 mg by mouth 2 (two) times daily with a meal.     Historical Provider, MD  Multiple Vitamins-Minerals (CENTRUM SILVER PO) Take 1 tablet by mouth every morning.    Historical Provider, MD  ranitidine (ZANTAC) 150 MG capsule Take 1 capsule (150 mg total) by mouth 2 (two) times daily. 10/14/15   Leonard Schwartz, MD   BP 123/73 mmHg  Pulse 87  Temp(Src) 98.4 F (36.9 C) (Oral)  Resp 14  Ht 5' (1.524 m)  Wt 169 lb (76.658 kg)  BMI 33.01 kg/m2  SpO2 98% Physical Exam Physical Exam  Nursing note and vitals reviewed. Constitutional: She is oriented to person, place, and time. She appears well-developed and well-nourished. No distress.  HENT:  Head: Normocephalic and atraumatic.  Eyes: Pupils are equal, round, and reactive to light.  Neck: Normal range of motion.  Cardiovascular: Normal rate and intact distal pulses.   Pulmonary/Chest: No respiratory distress.  Abdominal: Normal appearance. She exhibits no distension.  Musculoskeletal: Normal range of motion.  Neurological: She is alert and oriented to person, place, and time. No cranial nerve deficit.  Skin: Skin is warm and dry. No rash noted.  Psychiatric: She has a normal mood and affect. Her behavior is normal.   ED Course  Procedures (including critical care time) Medications  pantoprazole (PROTONIX) injection 40 mg (40 mg Intravenous Given 10/14/15 1324)    Labs Review Labs Reviewed  BASIC METABOLIC PANEL - Abnormal; Notable for the following:    Glucose, Bld 117 (*)    BUN 21 (*)    All other components within normal limits  CBC - Abnormal; Notable for the following:    Hemoglobin 11.4 (*)    HCT 35.7 (*)    All other components within normal limits  TROPONIN I    Imaging Review Dg Chest 2 View  10/14/2015  CLINICAL DATA:  Chest heaviness for 1 week and chronic cough. EXAM: CHEST - 2 VIEW COMPARISON:  04/10/2015 FINDINGS: The heart size and mediastinal contours are within normal limits. There is no evidence of pulmonary edema, consolidation, pneumothorax, nodule or  pleural fluid. The visualized skeletal structures are unremarkable. IMPRESSION: No active disease. Electronically Signed   By: Aletta Edouard M.D.   On: 10/14/2015 13:07   I have personally reviewed and evaluated these images and lab results as part of my medical decision-making.   EKG Interpretation   Date/Time:  Saturday October 14 2015 12:19:54 EST Ventricular Rate:  84 PR Interval:  150 QRS Duration: 75 QT Interval:  368 QTC Calculation: 435 R Axis:   30 Text Interpretation:  Sinus rhythm Normal ECG Confirmed by Alexus Michael  MD,  Alexxus Sobh ([id number]) on 10/14/2015 12:21:49 PM  MDM   Final diagnoses:  Gastroesophageal reflux disease, esophagitis presence not specified        Leonard Schwartz, MD 10/14/15 1325

## 2015-10-14 NOTE — ED Notes (Signed)
MD at bedside. 

## 2015-10-14 NOTE — Discharge Instructions (Signed)
Gastroesophageal Reflux Disease, Adult Normally, food travels down the esophagus and stays in the stomach to be digested. If a person has gastroesophageal reflux disease (GERD), food and stomach acid move back up into the esophagus. When this happens, the esophagus becomes sore and swollen (inflamed). Over time, GERD can make small holes (ulcers) in the lining of the esophagus. HOME CARE Diet  Follow a diet as told by your doctor. You may need to avoid foods and drinks such as:  Coffee and tea (with or without caffeine).  Drinks that contain alcohol.  Energy drinks and sports drinks.  Carbonated drinks or sodas.  Chocolate and cocoa.  Peppermint and mint flavorings.  Garlic and onions.  Horseradish.  Spicy and acidic foods, such as peppers, chili powder, curry powder, vinegar, hot sauces, and BBQ sauce.  Citrus fruit juices and citrus fruits, such as oranges, lemons, and limes.  Tomato-based foods, such as red sauce, chili, salsa, and pizza with red sauce.  Fried and fatty foods, such as donuts, french fries, potato chips, and high-fat dressings.  High-fat meats, such as hot dogs, rib eye steak, sausage, ham, and bacon.  High-fat dairy items, such as whole milk, butter, and cream cheese.  Eat small meals often. Avoid eating large meals.  Avoid drinking large amounts of liquid with your meals.  Avoid eating meals during the 2-3 hours before bedtime.  Avoid lying down right after you eat.  Do not exercise right after you eat. General Instructions  Pay attention to any changes in your symptoms.  Take over-the-counter and prescription medicines only as told by your doctor. Do not take aspirin, ibuprofen, or other NSAIDs unless your doctor says it is okay.  Do not use any tobacco products, including cigarettes, chewing tobacco, and e-cigarettes. If you need help quitting, ask your doctor.  Wear loose clothes. Do not wear anything tight around your waist.  Raise  (elevate) the head of your bed about 6 inches (15 cm).  Try to lower your stress. If you need help doing this, ask your doctor.  If you are overweight, lose an amount of weight that is healthy for you. Ask your doctor about a safe weight loss goal.  Keep all follow-up visits as told by your doctor. This is important. GET HELP IF:  You have new symptoms.  You lose weight and you do not know why it is happening.  You have trouble swallowing, or it hurts to swallow.  You have wheezing or a cough that keeps happening.  Your symptoms do not get better with treatment.  You have a hoarse voice. GET HELP RIGHT AWAY IF:  You have pain in your arms, neck, jaw, teeth, or back.  You feel sweaty, dizzy, or light-headed.  You have chest pain or shortness of breath.  You throw up (vomit) and your throw up looks like blood or coffee grounds.  You pass out (faint).  Your poop (stool) is bloody or black.  You cannot swallow, drink, or eat.   This information is not intended to replace advice given to you by your health care provider. Make sure you discuss any questions you have with your health care provider.   Document Released: 04/01/2008 Document Revised: 07/05/2015 Document Reviewed: 02/08/2015 Elsevier Interactive Patient Education 2016 [employer]  -   

## 2015-10-14 NOTE — ED Notes (Signed)
States earlier this week had some "fluttering type" feeling at ant chest, then developed into indigestion type discomfort, has taken rolaids without relief, so progressively chest pain has become worse.

## 2015-10-14 NOTE — ED Notes (Signed)
Placed on cont cardiac monitor, with cont POX and int NBP

## 2015-10-16 ENCOUNTER — Encounter: Payer: Self-pay | Admitting: Internal Medicine

## 2015-10-16 ENCOUNTER — Encounter: Payer: Self-pay | Admitting: Rehabilitative and Restorative Service Providers"

## 2015-10-16 ENCOUNTER — Ambulatory Visit (INDEPENDENT_AMBULATORY_CARE_PROVIDER_SITE_OTHER): Payer: Medicare Other | Admitting: Rehabilitative and Restorative Service Providers"

## 2015-10-16 DIAGNOSIS — M25512 Pain in left shoulder: Secondary | ICD-10-CM | POA: Diagnosis present

## 2015-10-16 DIAGNOSIS — R6889 Other general symptoms and signs: Secondary | ICD-10-CM

## 2015-10-16 DIAGNOSIS — R29898 Other symptoms and signs involving the musculoskeletal system: Secondary | ICD-10-CM

## 2015-10-16 DIAGNOSIS — M623 Immobility syndrome (paraplegic): Secondary | ICD-10-CM

## 2015-10-16 DIAGNOSIS — R531 Weakness: Secondary | ICD-10-CM

## 2015-10-16 DIAGNOSIS — Z7409 Other reduced mobility: Secondary | ICD-10-CM | POA: Diagnosis not present

## 2015-10-16 DIAGNOSIS — M2560 Stiffness of unspecified joint, not elsewhere classified: Secondary | ICD-10-CM

## 2015-10-16 NOTE — Therapy (Addendum)
[place], [place], [address] Phone: [phone]   Fax:  [phone]  Physical Therapy Treatment  Patient Details  Name: Heidi Franklin MRN: [id number] Date of Birth: 12/26/1945 Referring Provider: Charlann Boxer  Encounter Date: 10/16/2015      PT End of Session - 10/16/15 1112    Visit Number 6   Number of Visits 12   Date for PT Re-Evaluation 10/27/15   PT Start Time 1102   PT Stop Time 1152   PT Time Calculation (min) 50 min   Activity Tolerance Patient tolerated treatment well      Past Medical History  Diagnosis Date  . BRONCHITIS, CHRONIC   . ALLERGIC RHINITIS   . Anxiety state, unspecified   . DEPRESSION   . DIABETES MELLITUS, TYPE II   . GERD   . HYPERLIPIDEMIA   . HYPERTENSION     Past Surgical History  Procedure Laterality Date  . Abdominal hysterectomy  1980  . Trigger finger release      Index finger    There were no vitals filed for this visit.  Visit Diagnosis:  Pain in shoulder region, left  Stiffness due to immobility  Weakness of shoulder  Decreased strength, endurance, and mobility      Subjective Assessment - 10/16/15 1109    Subjective shoulder is feeling OK - was treated in the ED this weekend for GERD. Still having some trouble with that but shoulder is "not bad"    Currently in Pain? No/denies            [facility] PT Assessment - 10/16/15 0001    Assessment   Medical Diagnosis Lt shoulder pain    Referring Provider Charlann Boxer   Onset Date/Surgical Date 03/13/15   Hand Dominance Right   Observation/Other Assessments   Focus on Therapeutic Outcomes (FOTO)  --   AROM   Left Shoulder Flexion 147 Degrees   Left Shoulder ABduction 161 Degrees   Left Shoulder Internal Rotation 35 Degrees   Left Shoulder External Rotation 100 Degrees   Cervical Flexion 60   Cervical Extension 44   Cervical - Right Side Bend 35   Cervical - Left Side Bend 32   Cervical - Right Rotation 57   Cervical - Left Rotation 58   Strength   Left Shoulder Flexion --  5-/5   Left Shoulder Extension 5/5   Left Shoulder ABduction 5/5   Left Shoulder Internal Rotation 5/5   Left Shoulder External Rotation 5/5                     [facility] Adult PT Treatment/Exercise - 10/16/15 0001    Neuro Re-ed    Neuro Re-ed Details  working on posture and alignment engaging posterior shoulder girdle musculature    Shoulder Exercises: Seated   Other Seated Exercises lat cervical flexion stretch 10 sec x 5 to Rt    Shoulder Exercises: Standing   External Rotation Left;Strengthening;20 reps;Theraband   Theraband Level (Shoulder External Rotation) Level 1 (Yellow)   Extension Both;20 reps;Theraband   Theraband Level (Shoulder Extension) Level 3 (Green)   Row [employer];Theraband   Theraband Level (Shoulder Row) Level 3 (Green)   Retraction Both;20 reps;Theraband   Theraband Level (Shoulder Retraction) Level 1 (Yellow)   Other Standing Exercises scap squeeze with noodle 10 sec hold x 10 reps    Other Standing Exercises ER stretch hands clasp behind back pulling elbows back 10 sec x  5    Shoulder Exercises: ROM/Strengthening   UBE (Upper Arm Bike) L3 4 min alt fwd/back   Shoulder Exercises: Stretch   Corner Stretch Limitations doorway stretch 3 positions 30 sec x 3 reps    Wall Stretch - Flexion 5 reps;10 seconds   Other Shoulder Stretches supine snow angle 2 min total     Moist Heat Therapy   Number Minutes Moist Heat 15 Minutes   Moist Heat Location Shoulder;Cervical   Electrical Stimulation   Electrical Stimulation Location Lt shoulder    Electrical Stimulation Action IFC   Electrical Stimulation Parameters to tolerance   Electrical Stimulation Goals Pain   Manual Therapy   Manual therapy comments pt supine   Joint Mobilization GH inferior glides   Soft tissue mobilization Lt shd area/ Lt cervical musculature   Scapular Mobilization Lt   Passive  ROM Lt shd                      PT Long Term Goals - 10/16/15 1246    PT LONG TERM GOAL #1   Title Improve posture and alignment with patient to demo more upright posture through head and shoulders 10/27/15   Time 6   Period Weeks   Status Achieved   PT LONG TERM GOAL #2   Title Increase AROM Lt shoudler to equal or greater that of Rt shoulder 10/27/15   Time 6   Period Weeks   Status Achieved   PT LONG TERM GOAL #3   Title shoulder elevation to reach into chest height cabinet without pain 10/27/15   Time 6   Period Weeks   Status Achieved   PT LONG TERM GOAL #4   Title sleep without awakening due to Lt shoulder pain 10/27/15   Time 6   Period Weeks   Status Achieved   PT LONG TERM GOAL #5   Title Improve FOTO to </=32% limitation 10/27/15   Time 6   Period Weeks   Status On-going               Plan - 10/16/15 1243    Clinical Impression Statement Excellent progress with shoulder pain and neck pain. Pt has increased AROM Lt shoudler ~ equal to Rt shoudler. She has 5-/5 to 5/5 strength Lt shoulder and has returned to normal functional activities. She is apprehensive about discontinuing therapy, afraid the shoudler will start hurting again.    Pt will benefit from skilled therapeutic intervention in order to improve on the following deficits Postural dysfunction;Improper body mechanics;Increased fascial restricitons;Decreased range of motion;Decreased mobility;Decreased strength;Decreased activity tolerance;Decreased endurance;Pain   Rehab Potential Good   PT Frequency 2x / week   PT Duration 6 weeks   PT Treatment/Interventions Patient/family education;ADLs/Self Care Home Management;Therapeutic exercise;Therapeutic activities;Neuromuscular re-education;Manual techniques;Passive range of motion;Dry needling;Cryotherapy;Electrical Stimulation;Moist Heat;Ultrasound   PT Next Visit Plan patient will continue with I HEP and cal to schedule further appointments as  needed in the next 2-3 weeks    PT Home Exercise Plan postural correction; HEP   Consulted and Agree with Plan of Care Patient        Problem List Patient Active Problem List   Diagnosis Date Noted  . Peroneal tendinitis of left lower extremity 09/05/2015  . Recurrent falls 08/08/2015  . Bursitis of left shoulder 07/13/2015  . Obesity 04/16/2015  . Nonspecific abnormal electrocardiogram (ECG) (EKG) 05/11/2011  . Tenosynovitis of finger 04/22/2011  . DIARRHEA 11/16/2010  . ANXIETY STATE, UNSPECIFIED 10/12/2010  .  Type 2 diabetes, uncontrolled, with neuropathy ([place]) 03/02/2010  . Hyperlipidemia associated with type 2 diabetes mellitus ([place]) 03/02/2010  . Depression with anxiety 03/02/2010  . Essential hypertension 03/02/2010  . Chronic rhinitis 03/02/2010  . Upper airway cough syndrome 03/02/2010  . GERD 03/02/2010    Flonnie Wierman Nilda Simmer PT, MPH  10/16/2015, 1:03 PM  [facility] [place], [place], [address] Phone: [phone]   Fax:  [phone]  Name: Geniva Lohnes MRN: [id number] Date of Birth: 1946-01-30    PHYSICAL THERAPY DISCHARGE SUMMARY  Visits from Start of Care: 6  Current functional level related to goals / functional outcomes: Independent; returned to normal functional activities; min to no pain   Remaining deficits: Needs to continue stretching and strengthening to maintain changes   Education / Equipment: HEP; theraband Plan: Patient agrees to discharge.  Patient goals were partially met. Patient is being discharged due to being pleased with the current functional level.  ?????    Warden Buffa P. Helene Kelp PT, MPH 11/03/2015 8:35 AM

## 2015-10-16 NOTE — Telephone Encounter (Signed)
Please schedule ED f/u with new PCP to review GERD rx/sx thanks

## 2015-10-17 ENCOUNTER — Telehealth: Payer: Self-pay | Admitting: Internal Medicine

## 2015-10-17 NOTE — Telephone Encounter (Signed)
Pt has appointment with Dr. Tamala Julian on 12/27 at 10:45 and with Dr. Quay Burow on 12/27 at 4 pm. If patient is out with Dr. Tamala Julian by 11:00 she will be able to see Dr. Quay Burow following.

## 2015-10-17 NOTE — Telephone Encounter (Signed)
Left msg on vm for pt to call back to schedule an appointment to discuss medication with a new PCP, per Dr. Asa Lente

## 2015-10-24 ENCOUNTER — Ambulatory Visit: Payer: Medicare Other | Admitting: Internal Medicine

## 2015-10-24 ENCOUNTER — Encounter: Payer: Self-pay | Admitting: Internal Medicine

## 2015-10-24 ENCOUNTER — Ambulatory Visit (INDEPENDENT_AMBULATORY_CARE_PROVIDER_SITE_OTHER): Payer: Medicare Other | Admitting: Family Medicine

## 2015-10-24 ENCOUNTER — Ambulatory Visit (INDEPENDENT_AMBULATORY_CARE_PROVIDER_SITE_OTHER): Payer: Medicare Other | Admitting: Internal Medicine

## 2015-10-24 ENCOUNTER — Encounter: Payer: Self-pay | Admitting: Family Medicine

## 2015-10-24 VITALS — BP 130/64 | HR 102 | Ht 60.0 in | Wt 166.0 lb

## 2015-10-24 VITALS — BP 138/78 | HR 95 | Temp 98.3°F | Resp 16

## 2015-10-24 DIAGNOSIS — K219 Gastro-esophageal reflux disease without esophagitis: Secondary | ICD-10-CM | POA: Diagnosis not present

## 2015-10-24 DIAGNOSIS — E669 Obesity, unspecified: Secondary | ICD-10-CM

## 2015-10-24 DIAGNOSIS — F418 Other specified anxiety disorders: Secondary | ICD-10-CM | POA: Diagnosis not present

## 2015-10-24 DIAGNOSIS — E1140 Type 2 diabetes mellitus with diabetic neuropathy, unspecified: Secondary | ICD-10-CM

## 2015-10-24 DIAGNOSIS — IMO0002 Reserved for concepts with insufficient information to code with codable children: Secondary | ICD-10-CM

## 2015-10-24 DIAGNOSIS — E1165 Type 2 diabetes mellitus with hyperglycemia: Secondary | ICD-10-CM

## 2015-10-24 DIAGNOSIS — E785 Hyperlipidemia, unspecified: Secondary | ICD-10-CM

## 2015-10-24 DIAGNOSIS — R202 Paresthesia of skin: Secondary | ICD-10-CM

## 2015-10-24 DIAGNOSIS — M7552 Bursitis of left shoulder: Secondary | ICD-10-CM

## 2015-10-24 DIAGNOSIS — I10 Essential (primary) hypertension: Secondary | ICD-10-CM | POA: Diagnosis not present

## 2015-10-24 DIAGNOSIS — E1169 Type 2 diabetes mellitus with other specified complication: Secondary | ICD-10-CM

## 2015-10-24 MED ORDER — OMEPRAZOLE 40 MG PO CPDR: 40.0000 mg | DELAYED_RELEASE_CAPSULE | Freq: Every day | ORAL | Status: DC

## 2015-10-24 MED ORDER — PANTOPRAZOLE SODIUM 40 MG PO TBEC
40.0000 mg | DELAYED_RELEASE_TABLET | Freq: Every day | ORAL | Status: DC
Start: 2015-10-24 — End: 2016-02-22

## 2015-10-24 NOTE — Assessment & Plan Note (Signed)
Sponge very well to conservative therapy. We discussed icing regimen and home exercises. We discussed continuing this for the next 6 weeks. Will follow-up at that time to make sure completely pain-free

## 2015-10-24 NOTE — Patient Instructions (Addendum)
Stop the zantac (ranitidine) and omeprazole.   Take the pantoprazole (protonix) 30 minutes before breakfast.  Take the pepcid at night.    We have reviewed your prior records including labs and tests today.  Test(s) ordered today. Your results will be released to [facility] (or called to you) after review, usually within 72hours after test completion. If any changes need to be made, you will be notified at that same time.  All other Health Maintenance issues reviewed.   All recommended immunizations and age-appropriate screenings are up-to-date or discussed.  No immunizations administered today.   Your prescription(s) have been submitted to your pharmacy. Please take as directed and contact our office if you believe you are having problem(s) with the medication(s).  Please schedule followup in 4 months

## 2015-10-24 NOTE — Progress Notes (Signed)
[name] [address] [place], [place] [address] Phone: [phone] Subjective:      CC: Left shoulder pain follow-up  [id number] Heidi Franklin is a 69 y.o. female coming in with complaint of left shoulder pain. Patient was previously seen and was diagnosed with more of a subacromial bursitis. Patient is finished with physical therapy and did have an injection at last follow-up. Doing significantly better at this time. Patient is not having any significant pain. Patient states that she can sleep and 9. Able to do most daily activities with no problem whatsoever. Only a sharp pain with certain movements for very intermittently.   Past Medical History  Diagnosis Date  . BRONCHITIS, CHRONIC   . ALLERGIC RHINITIS   . Anxiety state, unspecified   . DEPRESSION   . DIABETES MELLITUS, TYPE II   . GERD   . HYPERLIPIDEMIA   . HYPERTENSION    Past Surgical History  Procedure Laterality Date  . Abdominal hysterectomy  1980  . Trigger finger release      Index finger   Social History  Substance Use Topics  . Smoking status: Never Smoker   . Smokeless tobacco: Never Used  . Alcohol Use: No   Allergies  Allergen Reactions  . Penicillins Rash   Family History  Problem Relation Age of Onset  . Stroke Mother   . Diabetes Father   . Hyperlipidemia Other     Parent  . Hypertension Other     Parent  . Colon cancer Neg Hx   . Esophageal cancer Neg Hx   . Rectal cancer Neg Hx   . Stomach cancer Neg Hx   . Pancreatic cancer Neg Hx         Past medical history, social, surgical and family history all reviewed in electronic medical record.   Review of Systems: No headache, visual changes, nausea, vomiting, diarrhea, constipation, dizziness, abdominal pain, skin rash, fevers, chills, night sweats, weight loss, swollen lymph nodes, body aches, joint swelling, muscle aches, chest pain, shortness of breath, mood changes.   Objective Blood pressure  130/64, pulse 102, height 5' (1.524 m), weight 166 lb (75.297 kg), SpO2 97 %.  General: No apparent distress alert and oriented x3 mood and affect normal, dressed appropriately.  HEENT: Pupils equal, extraocular movements intact  Respiratory: Patient's speak in full sentences and does not appear short of breath  Cardiovascular: No lower extremity edema, non tender, no erythema  Skin: Warm dry intact with no signs of infection or rash on extremities or on axial skeleton.  Abdomen: Soft nontender  Neuro: Cranial nerves II through XII are intact, neurovascularly intact in all extremities with 2+ DTRs and 2+ pulses.  Lymph: No lymphadenopathy of posterior or anterior cervical chain or axillae bilaterally.  Gait normal with good balance and coordination.  MSK:  Non tender with full range of motion and good stability and symmetric strength and tone of  elbows, wrist, hip, knees bilaterally.  Shoulder: left Inspection reveals no abnormalities, atrophy or asymmetry. Palpation is normal with no tenderness over AC joint or bicipital groove. ROM is full in all planes passively. Rotator cuff strength normal throughout.  continued impingement signs noted but improved from previous exam No labral pathology noted with negative Obrien's, negative clunk and good stability. Normal scapular function observed. No painful arc and no drop arm sign. No apprehension sign     Impression and Recommendations:     This case required medical decision making of  moderate complexity.

## 2015-10-24 NOTE — Progress Notes (Signed)
Pre visit review using our clinic review tool, if applicable. No additional management support is needed unless otherwise documented below in the visit note. 

## 2015-10-24 NOTE — Patient Instructions (Signed)
Great to see you Heidi Franklin is your friend.  Stay active and continue the exercises 3 times a week for another 6 weeks.  Continue the vitamins As long as it continues to improve see me when you need me.

## 2015-10-24 NOTE — Assessment & Plan Note (Signed)
Stressed weight loss Work on Wellsite geologist exercise Decrease portions

## 2015-10-24 NOTE — Progress Notes (Signed)
Subjective:    Patient ID: Heidi Franklin, female    DOB: 1945/11/29, 69 y.o.   MRN: [id number]  HPI She is here to establish with a new pcp.   GERD:  She has a history of GERD.  She takes omeprazole 20 mg daily and mylanta.  She also takes Pepcid nightly. She has had regurgitation sometimes.  She went to the ED and was diagnosed with GERD and was prescribed Zantac. She still experiences some heartburn and does not feel that her heartburn is well controlled.  Falls:  She has fallen four times.   She fell out of bed twice.  The first time she does not recall what happened.  She walked out of the store and just fell.  The other time she fell going in the door.  Dr Tamala Julian just took her off the tramadol, gabapentin and elavil.  She has not had any falls and these medications were discontinued.  Hypertension: She is taking her medication daily. She is compliant with a low sodium diet.  She denies chest pain, palpitations, edema, and regular headaches. She is not exercising regularly.  She does not monitor her blood pressure at home.    Diabetes: She is taking her medication daily as prescribed. She is fairly compliant with a diabetic diet. She is not exercising regularly. She is up-to-date with an ophthalmology examination.   Hyperlipidemia: She is taking her medication daily. She is compliant with a low fat/cholesterol diet. She is not exercising regularly. She denies myalgias.   Depression, anxiety: She is taking her medication daily as prescribed. She feels her anxiety and depression are controlled.  Medications and allergies reviewed with patient and updated if appropriate.  Patient Active Problem List   Diagnosis Date Noted  . Peroneal tendinitis of left lower extremity 09/05/2015  . Recurrent falls 08/08/2015  . Bursitis of left shoulder 07/13/2015  . Obesity 04/16/2015  . Nonspecific abnormal electrocardiogram (ECG) (EKG) 05/11/2011  . Tenosynovitis of finger 04/22/2011  . DIARRHEA  11/16/2010  . ANXIETY STATE, UNSPECIFIED 10/12/2010  . Type 2 diabetes, uncontrolled, with neuropathy ([place]) 03/02/2010  . Hyperlipidemia associated with type 2 diabetes mellitus ([place]) 03/02/2010  . Depression with anxiety 03/02/2010  . Essential hypertension 03/02/2010  . Chronic rhinitis 03/02/2010  . Upper airway cough syndrome 03/02/2010  . GERD 03/02/2010    Current Outpatient Prescriptions on File Prior to Visit  Medication Sig Dispense Refill  . albuterol (PROVENTIL HFA;VENTOLIN HFA) 108 (90 BASE) MCG/ACT inhaler Inhale 2 puffs into the lungs every 4 (four) hours as needed for wheezing or shortness of breath.    . ALPRAZolam (XANAX) 0.5 MG tablet Take 1 tablet by mouth every 8 hours if needed for anxiety. 30 tablet 2  . amLODipine (NORVASC) 10 MG tablet Take 1 tablet (10 mg total) by mouth daily. (Patient taking differently: Take 10 mg by mouth every morning. ) 90 tablet 3  . aspirin 81 MG tablet Take 81 mg by mouth every morning.     . Calcium Citrate-Vitamin D (CALCIUM + D PO) Take 1 tablet by mouth every morning.    . chlorpheniramine (CHLOR-TRIMETON) 4 MG tablet Take 2 tabs by mouth at bedtime and every 4 hours as needed for drainage/drippy nose/sneezing    . dextromethorphan (DELSYM) 30 MG/5ML liquid Take 2 tsp every 12 hours as needed with flutter valve for cough    . famotidine (PEPCID) 20 MG tablet Take 1 tablet (20 mg total) by mouth at bedtime. 30 tablet 11  .  fluticasone (FLONASE) 50 MCG/ACT nasal spray Place 1 spray into both nostrils 2 (two) times daily. 0.05 g 6  . glucose blood (ONE TOUCH ULTRA TEST) test strip 1 each by Other route daily. Use 1 strips to check blood sugar daily. Dx 250.00 100 each 2  . Lancets (ONETOUCH ULTRASOFT) lancets PRN    . lovastatin (MEVACOR) 20 MG tablet Take 1 tablet (20 mg total) by mouth at bedtime. 90 tablet 3  . meloxicam (MOBIC) 15 MG tablet Take 1 tablet (15 mg total) by mouth daily. 30 tablet 0  . metFORMIN (GLUCOPHAGE) 500 MG tablet  Take 500 mg by mouth 2 (two) times daily with a meal.    . Multiple Vitamins-Minerals (CENTRUM SILVER PO) Take 1 tablet by mouth every morning.    . potassium chloride SA (K-DUR,KLOR-CON) 20 MEQ tablet Take 1 tablet (20 mEq total) by mouth daily. 90 tablet 1  . ranitidine (ZANTAC) 150 MG capsule Take 1 capsule (150 mg total) by mouth 2 (two) times daily. 30 capsule 2  . Respiratory Therapy Supplies (FLUTTER) DEVI Use as directed 1 each 0  . sertraline (ZOLOFT) 100 MG tablet Take 1 tablet (100 mg total) by mouth every morning. 90 tablet 1  . tiZANidine (ZANAFLEX) 2 MG tablet Take 1 tablet (2 mg total) by mouth at bedtime. 30 tablet 1  . valsartan-hydrochlorothiazide (DIOVAN-HCT) 160-25 MG tablet TAKE 1 TABLET EVERY DAY 90 tablet 0  . [DISCONTINUED] omeprazole (PRILOSEC) 40 MG capsule Take 1 capsule (40 mg total) by mouth daily. (Patient taking differently: Take 40 mg by mouth daily before breakfast. ) 90 capsule 3   No current facility-administered medications on file prior to visit.    Past Medical History  Diagnosis Date  . BRONCHITIS, CHRONIC   . ALLERGIC RHINITIS   . Anxiety state, unspecified   . DEPRESSION   . DIABETES MELLITUS, TYPE II   . GERD   . HYPERLIPIDEMIA   . HYPERTENSION     Past Surgical History  Procedure Laterality Date  . Abdominal hysterectomy  1980  . Trigger finger release      Index finger    Social History   Social History  . Marital Status: Married    Spouse Name: N/A  . Number of Children: 0  . Years of Education: 81yr colge   Occupational History  . Retired     Social History Main Topics  . Smoking status: Never Smoker   . Smokeless tobacco: Never Used  . Alcohol Use: No  . Drug Use: No  . Sexual Activity: Not Asked   Other Topics Concern  . 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  Respiratory: Positive for cough (chronic cough ), shortness of breath (chronic bronchitis) and wheezing (occasional).   Cardiovascular: Negative for chest pain, palpitations and leg swelling.  Gastrointestinal: Negative for nausea and abdominal pain.       GERD  Neurological: Positive for numbness (Bilateral hands). Negative for dizziness, light-headedness and headaches.       Objective:   Filed Vitals:   10/24/15 1606  BP: 138/78  Pulse: 95  Temp: 98.3 F (36.8 C)  Resp: 16   There were no vitals filed for this visit. There is no weight on file to calculate BMI.   Physical  Exam Constitutional: Appears well-developed and well-nourished. No distress.  Neck: Neck supple. No tracheal deviation present. No thyromegaly present.  No carotid bruit. No cervical adenopathy.   Cardiovascular: Normal rate, regular rhythm and normal heart sounds.   No murmur heard. Pulmonary/Chest: Effort normal and breath sounds normal. No respiratory distress. No wheezes.  Musculoskeletal: No edema.          Assessment & Plan:   See problem list for assessment and plan  Follow-up in 4 months

## 2015-10-24 NOTE — Assessment & Plan Note (Signed)
BP Readings from Last 3 Encounters:  10/24/15 138/78  10/24/15 130/64  10/14/15 127/73   Blood pressure borderline here today, but has been controlled Continue current medication Work on weight loss

## 2015-10-24 NOTE — Assessment & Plan Note (Signed)
Anxiety and depression controlled Uses xanax only as needed zoloft dose is good  - continue same

## 2015-10-24 NOTE — Assessment & Plan Note (Signed)
Lab Results  Component Value Date   HGBA1C 6.8* 06/15/2015   Diabetes controlled Encouraged increased exercise and weight loss Continue current dose of metformin Recheck A1c

## 2015-10-24 NOTE — Assessment & Plan Note (Addendum)
GERD not adequately controlled Discontinue omeprazole Start pantoprazole 40 mg 30 minutes before breakfast Continue Pepcid nightly Discontinue ranitidine Stressed weight loss

## 2015-10-24 NOTE — Assessment & Plan Note (Signed)
Taking lovastatin 20 mg daily Check lipid panel

## 2015-11-08 ENCOUNTER — Telehealth: Payer: Self-pay | Admitting: Internal Medicine

## 2015-11-08 MED ORDER — METFORMIN HCL 500 MG PO TABS: 1000.0000 mg | ORAL_TABLET | Freq: Two times a day (BID) | ORAL | Status: DC

## 2015-11-08 NOTE — Telephone Encounter (Signed)
Patient states that sugar has been going up all week.  It has been registering around 169 and 179.  States this morning 222.  States usually it runs around 140 and under.  Would like to know if this could be some medication that she was prescribed that is causing this.

## 2015-11-08 NOTE — Telephone Encounter (Signed)
PLease advise

## 2015-11-08 NOTE — Telephone Encounter (Signed)
The only changes were her stomach meds aand I do not think that cuased the inc in sugars.  Any signs of infection?  Any other changes in meds or diet?  Increase the metformin to 1000mg  twice daily.  She should follow up next week if sugars are still high

## 2015-11-08 NOTE — Telephone Encounter (Signed)
Spoke with pt. She stated that she does currently have a yeast infection but sees Derm for this. Sending in a refill for Metformin. Pt will call next week if her sugars are not any better. She was instructed to call Derm about the Yeast infection

## 2015-11-10 DIAGNOSIS — B009 Herpesviral infection, unspecified: Secondary | ICD-10-CM | POA: Diagnosis not present

## 2015-11-10 DIAGNOSIS — L080 Pyoderma: Secondary | ICD-10-CM | POA: Diagnosis not present

## 2015-11-10 DIAGNOSIS — L308 Other specified dermatitis: Secondary | ICD-10-CM | POA: Diagnosis not present

## 2015-11-10 DIAGNOSIS — K133 Hairy leukoplakia: Secondary | ICD-10-CM | POA: Diagnosis not present

## 2015-11-14 DIAGNOSIS — Z1231 Encounter for screening mammogram for malignant neoplasm of breast: Secondary | ICD-10-CM | POA: Diagnosis not present

## 2015-11-17 ENCOUNTER — Encounter: Payer: Self-pay | Admitting: Internal Medicine

## 2015-11-25 MED ORDER — GLYBURIDE 2.5 MG PO TABS: 2.5000 mg | ORAL_TABLET | Freq: Every day | ORAL | Status: DC

## 2015-11-25 MED ORDER — METFORMIN HCL 500 MG PO TABS: 500.0000 mg | ORAL_TABLET | Freq: Two times a day (BID) | ORAL | Status: DC

## 2015-11-27 NOTE — Telephone Encounter (Signed)
Please give the patient a call. She has further concerns on this and wanted to speak with you. Thank you.

## 2015-12-01 ENCOUNTER — Telehealth: Payer: Self-pay | Admitting: Internal Medicine

## 2015-12-01 NOTE — Telephone Encounter (Signed)
Have her cut the pill in half and only take 1/2 of a pill with breakfast

## 2015-12-01 NOTE — Telephone Encounter (Signed)
Please advise 

## 2015-12-01 NOTE — Telephone Encounter (Signed)
LVM informing pt

## 2015-12-01 NOTE — Telephone Encounter (Signed)
Pt has been in contact with Dr. Quay Burow through [facility] regarding her diabetes medication. She started taking the glyBURIDE (DIABETA) 2.5 MG tablet [id number]. Last night her sugar was at 78 and this morning it was at 108. She's worried if she takes her medication this morning it will bring it too low. Can you please give her a call.

## 2015-12-04 ENCOUNTER — Ambulatory Visit (INDEPENDENT_AMBULATORY_CARE_PROVIDER_SITE_OTHER): Payer: Medicare Other | Admitting: Internal Medicine

## 2015-12-04 ENCOUNTER — Encounter: Payer: Self-pay | Admitting: Internal Medicine

## 2015-12-04 VITALS — BP 124/78 | HR 100 | Ht 60.0 in | Wt 166.2 lb

## 2015-12-04 DIAGNOSIS — R058 Other specified cough: Secondary | ICD-10-CM

## 2015-12-04 DIAGNOSIS — R05 Cough: Secondary | ICD-10-CM

## 2015-12-04 NOTE — Progress Notes (Signed)
Subjective:    Patient ID: Heidi Franklin, female   DOB: 03-27-1946    MRN: [id number]  Brief patient profile:  18  yobf never smoker with chronic cough  since age 70's only resolves for a week at a time then recurs daily,  dx as  post-nasal drip with nasal septal deviation, and GERD with neg MCT 11/2013      History of Present Illness  08/03/2012 f/u ov/Heidi Franklin cc cough for decades, better on present rx than for many years prior to pulmonary clinic changes, not using calendar, poor hfa  Technique but no limiting sob.  rec Change zyrtec to where you take it only if needed for itching/ sneezing  Work on inhaler technique:      Ok to use symbicort with spacer if not improving     Brief patient profile:  07/29/2013 f/u ov/Heidi Franklin re: chronic cough since age 33  Chief Complaint  Patient presents with  . Follow-up    Breathing is some worse since the last visit. She is needing xopenex for rescue approx 2 x per wk. She c/o hoarseness and ear pain.    >>Neurontin Three times a day  rx , change b/p meds to tribenzor    12/06/2013 NP Follow up and Med review  Patient returns for a followup visit and medication review. We reviewed all her medications organized them into a medication calendar with patient education. Appears the patient is taking her medications correctly. Since last visit. Patient reports that she is doing better. Cough is not as bad. Last ov Symbicort was changed to As needed  Use only.  She is using on average every other day. Not sure if she needs or not.  No fever, chest pain , edema or wheezing. No xopenex use since last ov.  rec MCT > neg > d/c symbicort    06/30/2014 f/u ov/Heidi Franklin re:  Uacs/ neg MCT / no med calendar  Chief Complaint  Patient presents with  . Acute Visit    Pt c/o cough for the past 2 days. Cough is mainly non prod, but did have very minimal hemoptysis this am.   cough worse at hs, not clear she's really following med calendar at all. Admits never in her life  cough free more than a few days. Mostly sob when coughing  rec Change neurontin to 100 mg four times a day Take the pm pepcid and 2 chlortrimeton at least an hour or two before bedtime For cough >>Take delsym two tsp every 12 hours and supplement if needed with  tramadol 50 mg up to 2 every 4 hours to suppress the urge to cough. Swallowing water or using ice chips/non mint and menthol containing candies (such as lifesavers or sugarless jolly ranchers) are also effective.  You should rest your voice and avoid activities that you know make you cough. Once you have eliminated the cough for 3 straight days try reducing the tramadol first,  then the delsym as tolerated.   Prednisone 10 mg take  4 each am x 2 days,   2 each am x 2 days,  1 each am x 2 days and stop zpak   08/23/2014 f/u ov/Heidi Franklin re: chronic hoarseness / cough  Chief Complaint  Patient presents with  . Follow-up    Pt states that her cough has improved since the last visit. Her breathing is doing well today.  She is using xopenex once every 2 wks on average and using symbicort the same.  Not following med calendar/ multiple errors. Only using neurotin 100 tid  >>Increase Neurontin Four times a day      04/10/2015 f/u ov/Heidi Franklin re: chronic cough / no med calendar  Chief Complaint  Patient presents with  . Follow-up    CXR done today. Pt states her cough is some better- still prod at times with light yellow sputum. She states that she is still wheezing some and uses her albuterol inhaler 2 x per day on average.   Cough bothers her the most at hs and church and better with chlortrimeton had improved but  no longer on neurontin then "caught cold" early May 2016 and can't shake it  Always better while on prednisone  Really only sob when coughing  >>increased gabapentin Four times a day  And pred taper   05/02/2015 NP Follow up and Med Review : Chronic cough  We reviewed all her meds and organized them into a med calendar with pt  education  Gabapentin was increased last ov to Four times a day  -she has not started the new dose yet.  Also given prednisone taper which has helped while she was on the taper.  Cough and hoarseness some better but still wax and wane.  Has hoarseness on/off . Seen by ENT with layryngoscopy in past few years ago.  We discussed cough control measures .  rec Increase neurontin 100 qid   07/05/2015 f/u ov/Heidi Franklin re: cough since her 20's really bad x last 10 years  Chief Complaint  Patient presents with  . Follow-up    Pt states having increased cough "since the weather has been hot".  She is using rescue inhaler 2-3 x per wk.   Using med calendar but note it does not line up with pill boxes and not able to verify she's actually taking the meds on the calendar when asked how she uses the calendar. Says pred always helps some then effects wear off over a week rec Prednisone 10 mg take  4 each am x 2 days,   2 each am x 2 days,  1 each am x 2 days and stop  Elavil 10 mg one at bedtime automatically   Increase the neurontin 300 mg three times a day  Please schedule a follow up office visit in 4 weeks, sooner if needed - return with meds in two bags - automatic vs as needed / bring pill box    08/03/2015  f/u ov/Heidi Franklin re: uacs/ severe hoarseness / no meds or pillboxes / did not increase neurontin  Chief Complaint  Patient presents with  . Follow-up    UACS. Pt states that her breathing is doing well. Pt c/o of dry cough/wheeze/SOB. Pt denies CP/tightness. Pt uses albuterol inhaler 2xs/wk.   Prednisone helped and has not lost ground since but still  Harsh dry cough daytime doesn't bother at hs  >>increase neurontin 300mg  Three times a day      09/01/2015 Follow up : Cough , never smoker  Patient returns for a one-month follow-up. Last visit. Patient was instructed to increase her Neurontin up to 3 times daily for persistent upper airway cough. Feels her cough is doing okay, has some dry cough but  no flare as she has had in past.   Patient says she was recently seen by her orthopedist to instructed her to stop her amitriptyline, gabapentin and tramadol due to frequent falls. She has been off for 2 weeks. Does feel better.  Feels more clear minded.  Workup for cough : CT sinus 11/08/11>>Negative paranasal sinuses.  Rightward nasal septal deviation and possible rhinitis. 01/13/12>>  RAST negative, IgE 22.7 PFT 11/07/11>>FEV1 1.79 (102%),  Ratio 82, TLC 4.18 (104%), DLCO 69%, positive BD only in terms of fef 25/75 -med calendar 11/23/2012 , 03/18/2013 , 08/26/2013 , 12/06/2013, 06/21/2014 > not keeping up with it  -methacholine challenge test  12/24/13 > neg  - try off singulair 12/31/2013 > no worse  - added neurontin 100 mg qid  05/20/14 but did not take consistently as of 06/30/2014 > increased to 100 qid 08/23/2014 improved then stopped it and flared spirng of 2016  - 07/05/2015 increase neurontin 300 tid and added elavil 10 mg at hs > not clear she did this at f/u ov 08/03/2015  - added flutter valve 08/03/2015    Feels that she is handling the cough right now.     12/04/2015  f/u ov/Heidi Franklin re:  Cough x 50 years  Chief Complaint  Patient presents with  . Follow-up    Cough has improved some. No new co's today.   chlorpheniramine best way to stop the tickle  Only uses it maybe once a day  Very rarely use saba now   No obvious day to day or daytime variability or assoc excess/ purulent sputum or mucus plugs or hemoptysis or cp or chest tightness, subjective wheeze or overt sinus or hb symptoms. No unusual exp hx or h/o childhood pna/ asthma or knowledge of premature birth.  Sleeping ok without nocturnal  or early am exacerbation  of respiratory  c/o's or need for noct saba. Also denies any obvious fluctuation of symptoms with weather or environmental changes or other aggravating or alleviating factors except as outlined above   Current Medications, Allergies, Complete Past Medical History, Past  Surgical History, Family History, and Social History were reviewed in [employer] record.  ROS  The following are not active complaints unless bolded sore throat, dysphagia, dental problems, itching, sneezing,  nasal congestion or excess/ purulent secretions, ear ache,   fever, chills, sweats, unintended wt loss, classically pleuritic or exertional cp,  orthopnea pnd or leg swelling, presyncope, palpitations, abdominal pain, anorexia, nausea, vomiting, diarrhea  or change in bowel or bladder habits, change in stools or urine, dysuria,hematuria,  rash, arthralgias, visual complaints, headache, numbness, weakness or ataxia or problems with walking or coordination,  change in mood/affect or memory.               Objective:   Physical Exam   Exam:  GEN: A/Ox3; pleasant , NAD,  amb bf nad   With gruff voice = baseline  05/20/2014       168   >168 06/21/2014 >  06/30/2014  168 > 08/23/2014  170 >183 03/20/2015 > 04/10/2015 180 >181 05/02/2015 >  07/05/2015 177  > 08/03/2015 171 > 12/04/2015  166   Vital signs reviewed  HEENT:  [place]/AT,  EACs    , NOSE-clear drainage  THROAT-clear, no lesions, no postnasal drip or exudate noted.   NECK:  Supple w/ fair ROM; no JVD; normal carotid impulses w/o bruits; no thyromegaly or nodules palpated; no lymphadenopathy.  RESP  Clear to A and P with no accessory muscle use, no dullness to percussion,    CARD:  RRR, no m/r/g  , no peripheral edema, pulses intact, no cyanosis or clubbing.  GI:   Soft & nt; nml bowel sounds; no organomegaly or masses detected.  Musco: Warm bil, no deformities or  joint swelling noted.   Neuro: alert, no focal deficits noted.    Skin: Warm, no lesions or rashes          Assessment:

## 2015-12-04 NOTE — Assessment & Plan Note (Signed)
CT sinus 11/08/11>>Negative paranasal sinuses.  Rightward nasal septal deviation and possible rhinitis. 01/13/12>>  RAST negative, IgE 22.7 PFT 11/07/11>>FEV1 1.79 (102%),  Ratio 82, TLC 4.18 (104%), DLCO 69%, positive BD only in terms of fef 25/75 -med calendar 11/23/2012 , 03/18/2013 , 08/26/2013 , 12/06/2013, 06/21/2014 > not keeping up with it  -methacholine challenge test  12/24/13 > neg  - try off singulair 12/31/2013 > no worse  - added neurontin 100 mg qid  05/20/14 but did not take consistently as of 06/30/2014 > increased to 100 qid 08/23/2014 improved then stopped it and flared spirng of 2016  - 07/05/2015 increase neurontin 300 tid and added elavil 10 mg at hs > not clear she did this at f/u ov 08/03/2015  - added flutter valve 08/03/2015  - neurontin/ultram /elavil stopped 07/2015 due to falls > no worse 12/04/2015    I had an extended final summary discussion with the patient reviewing all relevant studies completed to date and  lasting 15 to 20 minutes of a 25 minute visit on the following issues:    1) no evidence at all of asthma or significant allergy  2) if worse again consider refer to ent/ [facility] voice center > Dr Joya Gaskins     3) Each maintenance medication was reviewed in detail including most importantly the difference between maintenance and as needed and under what circumstances the prns are to be used. This was done in the context of a medication calendar review which provided the patient with a user-friendly unambiguous mechanism for medication administration and reconciliation and provides an action plan for all active problems. It is critical that this be shown to every doctor  for modification during the office visit if necessary so the patient can use it as a working document.

## 2015-12-04 NOTE — Patient Instructions (Signed)
Ok to use the chlorpheniramine more regularly to control the daytime tickling and remember to keep the candy handy  See calendar for specific medication instructions and bring it back for each and every office visit for every healthcare provider you see.  Without it,  you may not receive the best quality medical care that we feel you deserve.  You will note that the calendar groups together  your maintenance  medications that are timed at particular times of the day.  Think of this as your checklist for what your doctor has instructed you to do until your next evaluation to see what benefit  there is  to staying on a consistent group of medications intended to keep you well.  The other group at the bottom is entirely up to you to use as you see fit  for specific symptoms that may arise between visits that require you to treat them on an as needed basis.  Think of this as your action plan or "what if" list.   Separating the top medications from the bottom group is fundamental to providing you adequate care going forward.    Pulmonary follow up is as needed

## 2015-12-05 ENCOUNTER — Other Ambulatory Visit: Payer: Self-pay | Admitting: Internal Medicine

## 2015-12-08 DIAGNOSIS — B009 Herpesviral infection, unspecified: Secondary | ICD-10-CM | POA: Diagnosis not present

## 2015-12-08 DIAGNOSIS — L308 Other specified dermatitis: Secondary | ICD-10-CM | POA: Diagnosis not present

## 2015-12-08 DIAGNOSIS — D485 Neoplasm of uncertain behavior of skin: Secondary | ICD-10-CM | POA: Diagnosis not present

## 2015-12-12 ENCOUNTER — Telehealth: Payer: Self-pay | Admitting: Internal Medicine

## 2015-12-12 MED ORDER — PREDNISONE 10 MG PO TABS: ORAL_TABLET | ORAL | Status: DC

## 2015-12-12 MED ORDER — AZITHROMYCIN 250 MG PO TABS: ORAL_TABLET | ORAL | Status: DC

## 2015-12-12 NOTE — Telephone Encounter (Signed)
lmtcb X1 for pt  

## 2015-12-12 NOTE — Telephone Encounter (Signed)
Zpak/ Prednisone 10 mg take  4 each am x 2 days,   2 each am x 2 days,  1 each am x 2 days and stop  

## 2015-12-12 NOTE — Telephone Encounter (Signed)
Pt called back, aware of recs.  rx's sent to preferred pharmacy.  Nothing further needed.

## 2015-12-12 NOTE — Telephone Encounter (Signed)
Per 12/04/15 OV; Patient Instructions       Ok to use the chlorpheniramine more regularly to control the daytime tickling and remember to keep the candy handy See calendar for specific medication instructions and bring it back for each and every office visit for every healthcare provider you see.  Without it,  you may not receive the best quality medical care that we feel you deserve. You will note that the calendar groups together  your maintenance  medications that are timed at particular times of the day.  Think of this as your checklist for what your doctor has instructed you to do until your next evaluation to see what benefit  there is  to staying on a consistent group of medications intended to keep you well.  The other group at the bottom is entirely up to you to use as you see fit  for specific symptoms that may arise between visits that require you to treat them on an as needed basis.  Think of this as your action plan or "what if" list.  Separating the top medications from the bottom group is fundamental to providing you adequate care going forward.   Pulmonary follow up is as needed    ---  Pt reports she has caught a cold from spouse. She has been taking delsym and mucinex. She c/o dry cough, chest cong, slight wheezing, PND x yesterday. Pt wants something called in. Please advise MW thanks

## 2015-12-13 ENCOUNTER — Encounter: Payer: Self-pay | Admitting: Internal Medicine

## 2015-12-15 ENCOUNTER — Ambulatory Visit (INDEPENDENT_AMBULATORY_CARE_PROVIDER_SITE_OTHER)
Admission: RE | Admit: 2015-12-15 | Discharge: 2015-12-15 | Disposition: A | Discharge status: Home / Self Care | Payer: Medicare Other | Source: Ambulatory Visit | Attending: Adult Health | Admitting: Adult Health

## 2015-12-15 ENCOUNTER — Encounter: Payer: Self-pay | Admitting: Adult Health

## 2015-12-15 ENCOUNTER — Telehealth: Payer: Self-pay | Admitting: Internal Medicine

## 2015-12-15 ENCOUNTER — Ambulatory Visit (INDEPENDENT_AMBULATORY_CARE_PROVIDER_SITE_OTHER): Payer: Medicare Other | Admitting: Adult Health

## 2015-12-15 VITALS — BP 134/68 | HR 104 | Temp 98.6°F | Ht 60.0 in | Wt 165.0 lb

## 2015-12-15 DIAGNOSIS — R059 Cough, unspecified: Secondary | ICD-10-CM

## 2015-12-15 DIAGNOSIS — R05 Cough: Secondary | ICD-10-CM

## 2015-12-15 DIAGNOSIS — J208 Acute bronchitis due to other specified organisms: Secondary | ICD-10-CM

## 2015-12-15 DIAGNOSIS — R0602 Shortness of breath: Secondary | ICD-10-CM | POA: Diagnosis not present

## 2015-12-15 DIAGNOSIS — J209 Acute bronchitis, unspecified: Secondary | ICD-10-CM

## 2015-12-15 DIAGNOSIS — J40 Bronchitis, not specified as acute or chronic: Secondary | ICD-10-CM | POA: Insufficient documentation

## 2015-12-15 MED ORDER — LEVALBUTEROL HCL 0.63 MG/3ML IN NEBU
0.6300 mg | INHALATION_SOLUTION | Freq: Once | RESPIRATORY_TRACT | Status: AC
  Administered 2015-12-15: 0.63 mg via RESPIRATORY_TRACT

## 2015-12-15 MED ORDER — HYDROCODONE-HOMATROPINE 5-1.5 MG/5ML PO SYRP: 5.0000 mL | ORAL_SOLUTION | Freq: Four times a day (QID) | ORAL | Status: DC | PRN

## 2015-12-15 NOTE — Progress Notes (Signed)
Subjective:    Patient ID: Heidi Franklin, female   DOB: 03-27-1946    MRN: [id number]  Brief patient profile:  70  yobf never smoker with chronic cough  since age 70's only resolves for a week at a time then recurs daily,  dx as  post-nasal drip with nasal septal deviation, and GERD with neg MCT 11/2013      History of Present Illness  08/03/2012 f/u ov/Wert cc cough for decades, better on present rx than for many years prior to pulmonary clinic changes, not using calendar, poor hfa  Technique but no limiting sob.  rec Change zyrtec to where you take it only if needed for itching/ sneezing  Work on inhaler technique:      Ok to use symbicort with spacer if not improving     Brief patient profile:  07/29/2013 f/u ov/Wert re: chronic cough since age 33  Chief Complaint  Patient presents with  . Follow-up    Breathing is some worse since the last visit. She is needing xopenex for rescue approx 2 x per wk. She c/o hoarseness and ear pain.    >>Neurontin Three times a day  rx , change b/p meds to tribenzor    12/06/2013 NP Follow up and Med review  Patient returns for a followup visit and medication review. We reviewed all her medications organized them into a medication calendar with patient education. Appears the patient is taking her medications correctly. Since last visit. Patient reports that she is doing better. Cough is not as bad. Last ov Symbicort was changed to As needed  Use only.  She is using on average every other day. Not sure if she needs or not.  No fever, chest pain , edema or wheezing. No xopenex use since last ov.  rec MCT > neg > d/c symbicort    06/30/2014 f/u ov/Wert re:  Uacs/ neg MCT / no med calendar  Chief Complaint  Patient presents with  . Acute Visit    Pt c/o cough for the past 2 days. Cough is mainly non prod, but did have very minimal hemoptysis this am.   cough worse at hs, not clear she's really following med calendar at all. Admits never in her life  cough free more than a few days. Mostly sob when coughing  rec Change neurontin to 100 mg four times a day Take the pm pepcid and 2 chlortrimeton at least an hour or two before bedtime For cough >>Take delsym two tsp every 12 hours and supplement if needed with  tramadol 50 mg up to 2 every 4 hours to suppress the urge to cough. Swallowing water or using ice chips/non mint and menthol containing candies (such as lifesavers or sugarless jolly ranchers) are also effective.  You should rest your voice and avoid activities that you know make you cough. Once you have eliminated the cough for 3 straight days try reducing the tramadol first,  then the delsym as tolerated.   Prednisone 10 mg take  4 each am x 2 days,   2 each am x 2 days,  1 each am x 2 days and stop zpak   08/23/2014 f/u ov/Wert re: chronic hoarseness / cough  Chief Complaint  Patient presents with  . Follow-up    Pt states that her cough has improved since the last visit. Her breathing is doing well today.  She is using xopenex once every 2 wks on average and using symbicort the same.  Not following med calendar/ multiple errors. Only using neurotin 100 tid  >>Increase Neurontin Four times a day      04/10/2015 f/u ov/Wert re: chronic cough / no med calendar  Chief Complaint  Patient presents with  . Follow-up    CXR done today. Pt states her cough is some better- still prod at times with light yellow sputum. She states that she is still wheezing some and uses her albuterol inhaler 2 x per day on average.   Cough bothers her the most at hs and church and better with chlortrimeton had improved but  no longer on neurontin then "caught cold" early May 2016 and can't shake it  Always better while on prednisone  Really only sob when coughing  >>increased gabapentin Four times a day  And pred taper   05/02/2015 NP Follow up and Med Review : Chronic cough  We reviewed all her meds and organized them into a med calendar with pt  education  Gabapentin was increased last ov to Four times a day  -she has not started the new dose yet.  Also given prednisone taper which has helped while she was on the taper.  Cough and hoarseness some better but still wax and wane.  Has hoarseness on/off . Seen by ENT with layryngoscopy in past few years ago.  We discussed cough control measures .  rec Increase neurontin 100 qid   07/05/2015 f/u ov/Wert re: cough since her 20's really bad x last 10 years  Chief Complaint  Patient presents with  . Follow-up    Pt states having increased cough "since the weather has been hot".  She is using rescue inhaler 2-3 x per wk.   Using med calendar but note it does not line up with pill boxes and not able to verify she's actually taking the meds on the calendar when asked how she uses the calendar. Says pred always helps some then effects wear off over a week rec Prednisone 10 mg take  4 each am x 2 days,   2 each am x 2 days,  1 each am x 2 days and stop  Elavil 10 mg one at bedtime automatically   Increase the neurontin 300 mg three times a day  Please schedule a follow up office visit in 4 weeks, sooner if needed - return with meds in two bags - automatic vs as needed / bring pill box    08/03/2015  f/u ov/Wert re: uacs/ severe hoarseness / no meds or pillboxes / did not increase neurontin  Chief Complaint  Patient presents with  . Follow-up    UACS. Pt states that her breathing is doing well. Pt c/o of dry cough/wheeze/SOB. Pt denies CP/tightness. Pt uses albuterol inhaler 2xs/wk.   Prednisone helped and has not lost ground since but still  Harsh dry cough daytime doesn't bother at hs  >>increase neurontin 300mg  Three times a day      09/01/2015 Follow up : Cough , never smoker  Patient returns for a one-month follow-up. Last visit. Patient was instructed to increase her Neurontin up to 3 times daily for persistent upper airway cough. Feels her cough is doing okay, has some dry cough but  no flare as she has had in past.   Patient says she was recently seen by her orthopedist to instructed her to stop her amitriptyline, gabapentin and tramadol due to frequent falls. She has been off for 2 weeks. Does feel better.  Feels more clear minded.  Workup for cough : CT sinus 11/08/11>>Negative paranasal sinuses.  Rightward nasal septal deviation and possible rhinitis. 01/13/12>>  RAST negative, IgE 22.7 PFT 11/07/11>>FEV1 1.79 (102%),  Ratio 82, TLC 4.18 (104%), DLCO 69%, positive BD only in terms of fef 25/75 -med calendar 11/23/2012 , 03/18/2013 , 08/26/2013 , 12/06/2013, 06/21/2014 > not keeping up with it  -methacholine challenge test  12/24/13 > neg  - try off singulair 12/31/2013 > no worse  - added neurontin 100 mg qid  05/20/14 but did not take consistently as of 06/30/2014 > increased to 100 qid 08/23/2014 improved then stopped it and flared spirng of 2016  - 07/05/2015 increase neurontin 300 tid and added elavil 10 mg at hs > not clear she did this at f/u ov 08/03/2015  - added flutter valve 08/03/2015    Feels that she is handling the cough right now.     12/04/2015  f/u ov/Wert re:  Cough x 50 years  Chief Complaint  Patient presents with  . Follow-up    Cough has improved some. No new co's today.   chlorpheniramine best way to stop the tickle  Only uses it maybe once a day  Very rarely use saba now >chlor tabs   12/15/2015 Acute OV  Pt presents for an acute office visit. Complains of chest tightness, sinus drainage, SOB, dry cough starting on 12/09/15. Denies any chest congestion, sinus pressure, fever, nausea or vomiting. Currently taking a zpak and pred taper given by MW on 12/12/15. CXR today shows bronchitic changes  Says she ran out of hydromet , helped some. Wants refill.  Denies chest pain, orthopnea, edema or fever.    Workup for cough : CT sinus 11/08/11>>Negative paranasal sinuses.  Rightward nasal septal deviation and possible rhinitis. 01/13/12>>  RAST negative, IgE  22.7 PFT 11/07/11>>FEV1 1.79 (102%),  Ratio 82, TLC 4.18 (104%), DLCO 69%, positive BD only in terms of fef 25/75 -med calendar 11/23/2012 , 03/18/2013 , 08/26/2013 , 12/06/2013, 06/21/2014 > not keeping up with it  -methacholine challenge test  12/24/13 > neg  - try off singulair 12/31/2013 > no worse  - added neurontin 100 mg qid  05/20/14 but did not take consistently as of 06/30/2014 > increased to 100 qid 08/23/2014 improved then stopped it and flared spirng of 2016  - 07/05/2015 increase neurontin 300 tid and added elavil 10 mg at hs > not clear she did this at f/u ov 08/03/2015  - added flutter valve 08/03/2015  -08/2015 neurontin /tramadol stopped due to falls.    Current Medications, Allergies, Complete Past Medical History, Past Surgical History, Family History, and Social History were reviewed in [employer] record.  ROS  The following are not active complaints unless bolded sore throat, dysphagia, dental problems, itching, sneezing,  nasal congestion or excess/ purulent secretions, ear ache,   fever, chills, sweats, unintended wt loss, classically pleuritic or exertional cp,  orthopnea pnd or leg swelling, presyncope, palpitations, abdominal pain, anorexia, nausea, vomiting, diarrhea  or change in bowel or bladder habits, change in stools or urine, dysuria,hematuria,  rash, arthralgias, visual complaints, headache, numbness, weakness or ataxia or problems with walking or coordination,  change in mood/affect or memory.               Objective:   Physical Exam   Exam:  GEN: A/Ox3; pleasant , NAD,  amb bf nad     Filed Vitals:   12/15/15 1533  BP: 134/68  Pulse: 104  Temp: 98.6 F (  37 C)  TempSrc: Oral  Height: 5' (1.524 m)  Weight: 165 lb (74.844 kg)  SpO2: 96%  . 05/20/2014       168   >168 06/21/2014 >  06/30/2014  168 > 08/23/2014  170 >183 03/20/2015 > 04/10/2015 180 >181 05/02/2015 >  07/05/2015 177  > 08/03/2015 171 > 12/04/2015  166 >165 12/15/2015   Vital signs  reviewed  HEENT:  [place]/AT,  EACs    , NOSE-clear drainage  THROAT-clear, no lesions, no postnasal drip or exudate noted.   NECK:  Supple w/ fair ROM; no JVD; normal carotid impulses w/o bruits; no thyromegaly or nodules palpated; no lymphadenopathy.  RESP  Clear to A and P with no accessory muscle use, no dullness to percussion,    CARD:  RRR, no m/r/g  , no peripheral edema, pulses intact, no cyanosis or clubbing.  GI:   Soft & nt; nml bowel sounds; no organomegaly or masses detected.  Musco: Warm bil, no deformities or joint swelling noted.   Neuro: alert, no focal deficits noted.    Skin: Warm, no lesions or rashes      CXR 12/15/2015  Bronchitic changes  Reviewed independently     Assessment:

## 2015-12-15 NOTE — Patient Instructions (Signed)
Finish Prednisone and Zpack .  Delsym 2 tsp Twice daily  As needed  Cough  Chlortrimeton 4mg  1 every 4hrs as needed for drainage.  Mucinex Twice daily  As needed  Congestion .  Hydromet 1 tsp every 6hr as needed. - may make you sleepy.  Please contact office for sooner follow up if symptoms do not improve or worsen or seek emergency care   follow up Dr. Melvyn Novas  As planned and As needed

## 2015-12-15 NOTE — Telephone Encounter (Signed)
Called spoke with pt. She is still on ZPAK and prednisone from 2/14. She reports her cough is much worse and is coughing up clear phlem. Reports was up all night coughing. Very little wheezing,. She is scheduled to see TP today at 3:30. Nothing further needed

## 2015-12-15 NOTE — Assessment & Plan Note (Signed)
Flare -resolving  No sign of PNA  If not improving consider CT chest -HRCT to look closer at lung parenchyma .   Plan  finish Prednisone and Zpack .  Delsym 2 tsp Twice daily  As needed  Cough  Chlortrimeton 4mg  1 every 4hrs as needed for drainage.  Mucinex Twice daily  As needed  Congestion .  Hydromet 1 tsp every 6hr as needed. - may make you sleepy.  Please contact office for sooner follow up if symptoms do not improve or worsen or seek emergency care   follow up Dr. Melvyn Novas  As planned and As needed

## 2015-12-16 NOTE — Progress Notes (Signed)
Chart and office note reviewed in detail   > agree with a/p as outlined - may need to titrate back up on gabapentin to the lowest effective dose

## 2015-12-19 DIAGNOSIS — E119 Type 2 diabetes mellitus without complications: Secondary | ICD-10-CM | POA: Diagnosis not present

## 2015-12-19 DIAGNOSIS — H259 Unspecified age-related cataract: Secondary | ICD-10-CM | POA: Diagnosis not present

## 2015-12-29 NOTE — Telephone Encounter (Signed)
Pt called to check up on this, pt stated she need this to send in to [facility] Ref # [id number] and phone # [phone]. Per pt, the pharmacy do not have anything we sent in.

## 2015-12-30 ENCOUNTER — Other Ambulatory Visit: Payer: Self-pay | Admitting: Internal Medicine

## 2016-01-01 ENCOUNTER — Telehealth: Payer: Self-pay | Admitting: *Deleted

## 2016-01-01 MED ORDER — GLUCOSE BLOOD VI STRP: 1.0000 | ORAL_STRIP | Freq: Every day | Status: DC

## 2016-01-01 MED ORDER — ONETOUCH ULTRASOFT LANCETS MISC: Status: DC

## 2016-01-01 NOTE — Telephone Encounter (Signed)
Received call pt states she is using [facility] for her testing supplies needing diabetic supplies for her one touch called into company. Obtain # from pt [phone] (F) [phone] inform pt will call order into company..../lmb Call [facility] [facility] spoke with rep he stated since pt is a first time user will have to send diabetic order. Gave fx # he stated should get in next 5-10 mins...Heidi Franklin

## 2016-01-02 NOTE — Telephone Encounter (Signed)
Received diabetic form from [facility] MD sign has been faxed back...Johny Chess

## 2016-01-05 DIAGNOSIS — L400 Psoriasis vulgaris: Secondary | ICD-10-CM | POA: Diagnosis not present

## 2016-01-05 DIAGNOSIS — L409 Psoriasis, unspecified: Secondary | ICD-10-CM | POA: Diagnosis not present

## 2016-01-08 ENCOUNTER — Other Ambulatory Visit: Payer: Self-pay | Admitting: Emergency Medicine

## 2016-01-08 MED ORDER — METFORMIN HCL 500 MG PO TABS: 500.0000 mg | ORAL_TABLET | Freq: Two times a day (BID) | ORAL | Status: DC

## 2016-01-15 ENCOUNTER — Ambulatory Visit: Payer: Medicare Other | Admitting: Internal Medicine

## 2016-01-19 ENCOUNTER — Encounter: Payer: Self-pay | Admitting: Internal Medicine

## 2016-01-19 ENCOUNTER — Encounter: Payer: Self-pay | Admitting: Family Medicine

## 2016-01-19 MED ORDER — FLUTICASONE PROPIONATE 50 MCG/ACT NA SUSP: 1.0000 | Freq: Two times a day (BID) | NASAL | Status: DC

## 2016-01-23 ENCOUNTER — Other Ambulatory Visit: Payer: Self-pay | Admitting: *Deleted

## 2016-01-23 MED ORDER — ALCOHOL SWABS 70 % PADS: MEDICATED_PAD | Status: DC

## 2016-01-23 NOTE — Telephone Encounter (Signed)
Left msg on triage sent order for alcohol swabs. Checking status or can call rx in Ref # [id number]. Faxed script to all [place]....Heidi Franklin

## 2016-02-02 ENCOUNTER — Ambulatory Visit: Payer: Medicare Other | Admitting: Family Medicine

## 2016-02-05 ENCOUNTER — Encounter: Payer: Self-pay | Admitting: Acute Care

## 2016-02-05 ENCOUNTER — Telehealth: Payer: Self-pay | Admitting: Pulmonary Disease

## 2016-02-05 ENCOUNTER — Ambulatory Visit (INDEPENDENT_AMBULATORY_CARE_PROVIDER_SITE_OTHER): Payer: Medicare Other | Admitting: Acute Care

## 2016-02-05 VITALS — BP 130/62 | HR 97 | Ht 60.0 in | Wt 164.0 lb

## 2016-02-05 DIAGNOSIS — J209 Acute bronchitis, unspecified: Secondary | ICD-10-CM

## 2016-02-05 MED ORDER — AZITHROMYCIN 250 MG PO TABS: 250.0000 mg | ORAL_TABLET | Freq: Every day | ORAL | Status: DC

## 2016-02-05 MED ORDER — PREDNISONE 10 MG PO TABS: ORAL_TABLET | ORAL | Status: DC

## 2016-02-05 NOTE — Assessment & Plan Note (Signed)
Acute Brinchitis flare: No signs of pneumonia  Plan: We will call in a prednisone taper Prednisone taper; 10 mg tablets: 4 tabs x 2 days, 3 tabs x 2 days, 2 tabs x 2 days 1 tab x 2 days then stop. Zpack Use the hydromet cough syrup 1 teaspoon for sleep at night every 4-6 hours. This will make you sleepy. Do not drive if sleepy. Follow up in 2 weeks with either me or Dr. Melvyn Novas. Please contact office for sooner follow up if symptoms do not improve or worsen or seek emergency care

## 2016-02-05 NOTE — Telephone Encounter (Signed)
Thank you :)

## 2016-02-05 NOTE — Patient Instructions (Signed)
It is nice to meet you today. I am sorry you are not feeling well. We will call in a prednisone taper Prednisone taper; 10 mg tablets: 4 tabs x 2 days, 3 tabs x 2 days, 2 tabs x 2 days 1 tab x 2 days then stop. Zpack Use the hydromet cough syrup 1 teaspoon for sleep at night every 4-6 hours. This will make you sleepy. Do not drive if sleepy. Follow up in 2 weeks with either me or Dr. Melvyn Novas. Please contact office for sooner follow up if symptoms do not improve or worsen or seek emergency care

## 2016-02-05 NOTE — Progress Notes (Signed)
Subjective:    Patient ID: Heidi Franklin, female    DOB: 1946/03/18, 70 y.o.   MRN: [id number]  HPI 35 yobf never smoker with chronic cough since age 83's only resolves for a week at a time then recurs daily, dx as post-nasal drip with nasal septal deviation, and GERD with neg MCT 11/2013    Workup for cough : CT sinus 11/08/11>>Negative paranasal sinuses. Rightward nasal septal deviation and possible rhinitis. 01/13/12>> RAST negative, IgE 22.7 PFT 11/07/11>>FEV1 1.79 (102%), Ratio 82, TLC 4.18 (104%), DLCO 69%, positive BD only in terms of fef 25/75 -med calendar 11/23/2012 , 03/18/2013 , 08/26/2013 , 12/06/2013, 06/21/2014 > not keeping up with it  -methacholine challenge test 12/24/13 > neg  - try off singulair 12/31/2013 > no worse  - added neurontin 100 mg qid 05/20/14 but did not take consistently as of 06/30/2014 > increased to 100 qid 08/23/2014 improved then stopped it and flared spirng of 2016  - 07/05/2015 increase neurontin 300 tid and added elavil 10 mg at hs > not clear she did this at f/u ov 08/03/2015  - added flutter valve 08/03/2015  -08/2015 neurontin /tramadol stopped due to falls.  12/15/15: CXR  Mild to moderate changes of acute bronchitis and/or asthma without focal airspace pneumonia.  02/05/2016: Acute office visit Patient presents to the office with increased productive cough, chest tightness, low-grade fever( 99.8 F) and sputum color change to yellow with scant blood-tinged mucus. She denies chest pain, orthopnea, hemoptysis, or edema. No leg or calf pain, no recent automobile or airline travel. She states she is compliant with her maintenance meds.   Current outpatient prescriptions:  .  albuterol (PROVENTIL HFA;VENTOLIN HFA) 108 (90 BASE) MCG/ACT inhaler, Inhale 2 puffs into the lungs every 4 (four) hours as needed for wheezing or shortness of breath., Disp: , Rfl:  .  Alcohol Swabs 70 % PADS, Use to clean site to check blood sugars Dx e11.9, Disp: 100 each,  Rfl: 3 .  ALPRAZolam (XANAX) 0.5 MG tablet, Take 1 tablet by mouth every 8 hours if needed for anxiety., Disp: 30 tablet, Rfl: 2 .  amLODipine (NORVASC) 10 MG tablet, Take 1 tablet (10 mg total) by mouth daily. (Patient taking differently: Take 10 mg by mouth every morning. ), Disp: 90 tablet, Rfl: 3 .  aspirin 81 MG tablet, Take 81 mg by mouth every morning. , Disp: , Rfl:  .  Calcium Citrate-Vitamin D (CALCIUM + D PO), Take 1 tablet by mouth every morning., Disp: , Rfl:  .  chlorpheniramine (CHLOR-TRIMETON) 4 MG tablet, Take 2 tabs by mouth at bedtime and every 4 hours as needed for drainage/drippy nose/sneezing, Disp: , Rfl:  .  dextromethorphan (DELSYM) 30 MG/5ML liquid, Take 2 tsp every 12 hours as needed with flutter valve for cough, Disp: , Rfl:  .  famotidine (PEPCID) 20 MG tablet, Take 1 tablet (20 mg total) by mouth at bedtime., Disp: 30 tablet, Rfl: 11 .  fluticasone (FLONASE) 50 MCG/ACT nasal spray, Place 1 spray into both nostrils 2 (two) times daily., Disp: 48 g, Rfl: 1 .  glucose blood (ONE TOUCH ULTRA TEST) test strip, 1 each by Other route daily. Use 1 strips to check blood sugar twice a day Ex E11.9, Disp: 100 each, Rfl: 3 .  glyBURIDE (DIABETA) 2.5 MG tablet, Take 1 tablet (2.5 mg total) by mouth daily with breakfast. (Patient taking differently: Take 1.25 mg by mouth daily with breakfast. ), Disp: 30 tablet, Rfl: 5 .  HYDROcodone-homatropine (  HYDROMET) 5-1.5 MG/5ML syrup, Take 5 mLs by mouth every 6 (six) hours as needed., Disp: 240 mL, Rfl: 0 .  Lancets (ONETOUCH ULTRASOFT) lancets, PRN, Disp: 100 each, Rfl: 3 .  lovastatin (MEVACOR) 20 MG tablet, Take 1 tablet (20 mg total) by mouth at bedtime., Disp: 90 tablet, Rfl: 3 .  metFORMIN (GLUCOPHAGE) 500 MG tablet, Take 1 tablet (500 mg total) by mouth 2 (two) times daily with a meal., Disp: 60 tablet, Rfl: 2 .  Multiple Vitamins-Minerals (CENTRUM SILVER PO), Take 1 tablet by mouth every morning., Disp: , Rfl:  .  pantoprazole  (PROTONIX) 40 MG tablet, Take 1 tablet (40 mg total) by mouth daily., Disp: 90 tablet, Rfl: 1 .  potassium chloride SA (K-DUR,KLOR-CON) 20 MEQ tablet, Take 1 tablet (20 mEq total) by mouth daily., Disp: 90 tablet, Rfl: 1 .  Respiratory Therapy Supplies (FLUTTER) DEVI, Use as directed, Disp: 1 each, Rfl: 0 .  sertraline (ZOLOFT) 100 MG tablet, Take 1 tablet (100 mg total) by mouth every morning., Disp: 90 tablet, Rfl: 1 .  valsartan-hydrochlorothiazide (DIOVAN-HCT) 160-25 MG tablet, TAKE 1 TABLET EVERY DAY, Disp: 90 tablet, Rfl: 3 .  azithromycin (ZITHROMAX) 250 MG tablet, Take 1 tablet (250 mg total) by mouth daily., Disp: 6 tablet, Rfl: 0 .  predniSONE (DELTASONE) 10 MG tablet, Take 4 tabs x 2 days, 3 x 2 days, 2 x 2 days,1 x 2 days, then stop, Disp: 20 tablet, Rfl: 0   Past Medical History  Diagnosis Date  . BRONCHITIS, CHRONIC   . ALLERGIC RHINITIS   . Anxiety state, unspecified   . DEPRESSION   . DIABETES MELLITUS, TYPE II   . GERD   . HYPERLIPIDEMIA   . HYPERTENSION     Allergies  Allergen Reactions  . Penicillins Rash    Review of Systems Constitutional:   No  weight loss, night sweats, + Fevers, no chills, fatigue, or  lassitude.  HEENT:   No headaches,  Difficulty swallowing,  Tooth/dental problems, or  Sore throat,                No sneezing, itching, ear ache, nasal congestion, post nasal drip,   CV:  No chest pain,  Orthopnea, PND, swelling in lower extremities, anasarca, dizziness, palpitations, syncope.   GI  No heartburn, indigestion, abdominal pain, nausea, vomiting, diarrhea, change in bowel habits, loss of appetite, bloody stools.   Resp: No shortness of breath with exertion or at rest.  + excess mucus, + productive cough,  No non-productive cough,  + coughing up scant amounts of blood.  + change in color of mucus.  No wheezing.  No chest wall deformity  Skin: no rash or lesions.  GU: no dysuria, change in color of urine, no urgency or frequency.  No flank  pain, no hematuria   MS:  No joint pain or swelling.  No decreased range of motion.  No back pain.  Psych:  No change in mood or affect. No depression or anxiety.  No memory loss.        Objective:   Physical Exam  BP 130/62 mmHg  Pulse 97  Ht 5' (1.524 m)  Wt 164 lb (74.39 kg)  BMI 32.03 kg/m2  SpO2 100%   Physical Exam:  General- No distress,  A&Ox3 ENT: No sinus tenderness, TM clear, pale nasal mucosa, no oral exudate,no post nasal drip, no LAN Cardiac: S1, S2, regular rate and rhythm, no murmur Chest: No wheeze/ rales/ dullness; no accessory muscle  use, no nasal flaring, no sternal retractions Abd.: Soft Non-tender Ext: No clubbing cyanosis, edema Neuro:  normal strength Skin: No rashes, warm and dry Psych: normal mood and behavior  Magdalen Spatz, AGACNP-BC [facility] Pager # [phone] 02/05/2016     Assessment & Plan:

## 2016-02-05 NOTE — Telephone Encounter (Signed)
Called by [facility] [phone]) concerning Azithromycin prescription dosing. Will alter script to 2 tablets on day one and then one tablet daily until the pills are gone.

## 2016-02-06 NOTE — Progress Notes (Signed)
Chart and office note reviewed in detail  > agree with a/p as outlined    

## 2016-02-07 IMAGING — CR DG CHEST 2V
2 series · 2 of 2 positions shown · non-contrast
Comparison: None.

CLINICAL DATA: Cough.

EXAM:
CHEST  2 VIEW

[view not recorded (1 of 2)]
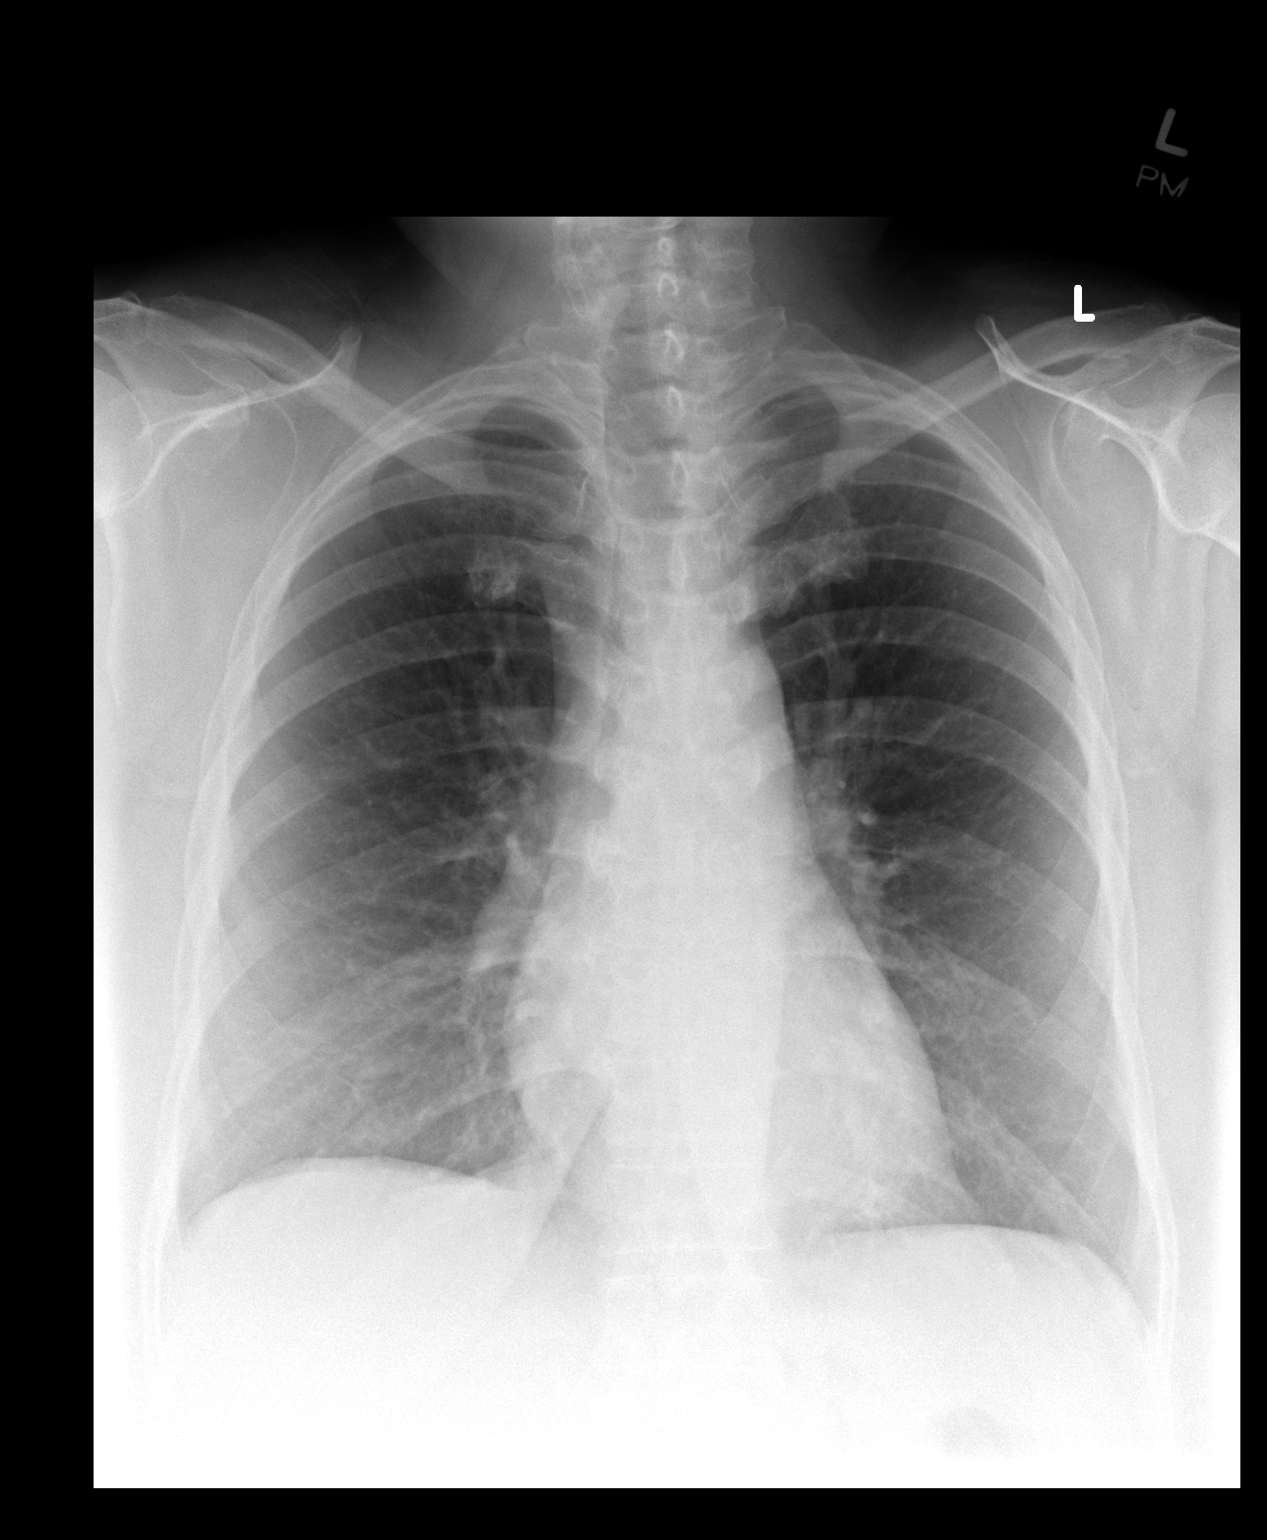

[view not recorded (2 of 2)]
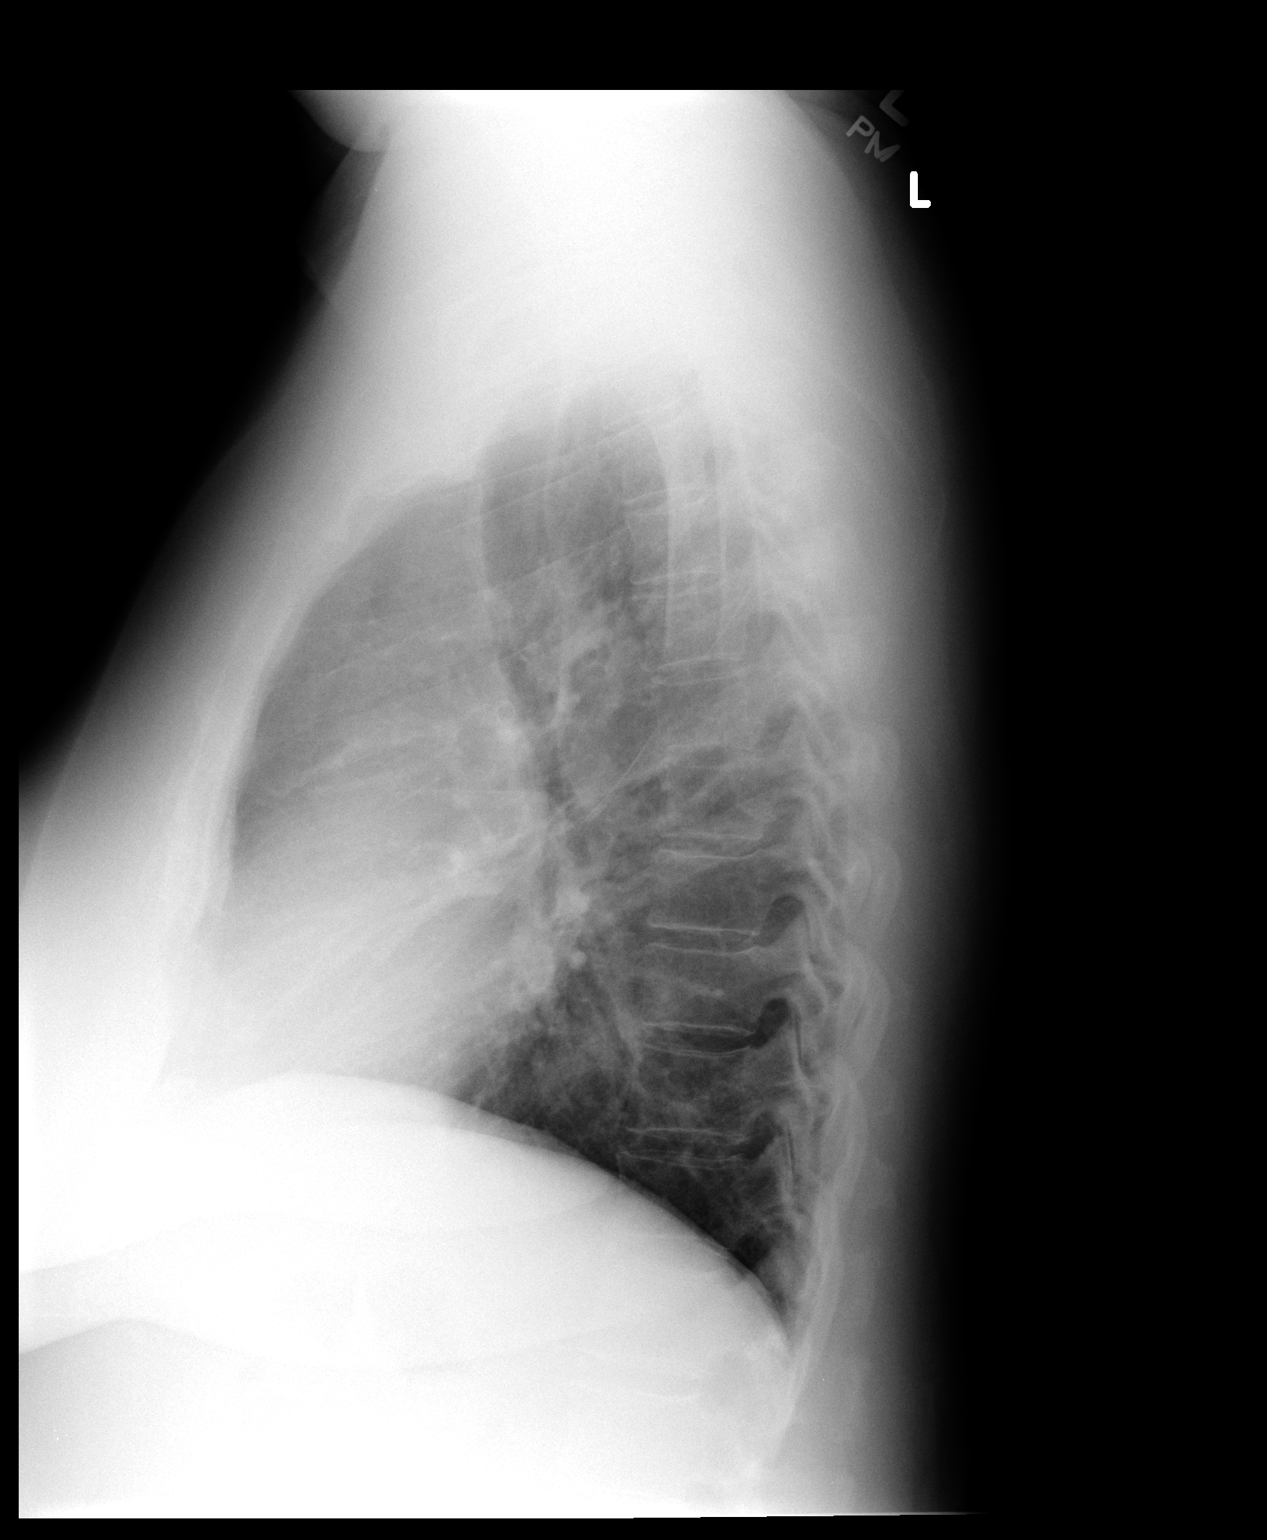

[2 of 2 positions shown; findings below may reference images not displayed]

FINDINGS: Mediastinum hilar structures normal. Lungs are clear. Heart size
normal. No pleural effusion or pneumothorax. No acute bony
abnormality.
IMPRESSION: No acute cardiopulmonary disease.

## 2016-02-13 DIAGNOSIS — L400 Psoriasis vulgaris: Secondary | ICD-10-CM | POA: Diagnosis not present

## 2016-02-14 ENCOUNTER — Other Ambulatory Visit: Payer: Self-pay | Admitting: Internal Medicine

## 2016-02-22 ENCOUNTER — Encounter: Payer: Self-pay | Admitting: Internal Medicine

## 2016-02-22 ENCOUNTER — Ambulatory Visit (INDEPENDENT_AMBULATORY_CARE_PROVIDER_SITE_OTHER): Payer: Medicare Other | Admitting: Internal Medicine

## 2016-02-22 ENCOUNTER — Other Ambulatory Visit (INDEPENDENT_AMBULATORY_CARE_PROVIDER_SITE_OTHER): Payer: Medicare Other

## 2016-02-22 VITALS — BP 132/64 | HR 97 | Temp 98.3°F | Resp 16 | Wt 161.0 lb

## 2016-02-22 DIAGNOSIS — I10 Essential (primary) hypertension: Secondary | ICD-10-CM

## 2016-02-22 DIAGNOSIS — E1169 Type 2 diabetes mellitus with other specified complication: Secondary | ICD-10-CM

## 2016-02-22 DIAGNOSIS — Z1159 Encounter for screening for other viral diseases: Secondary | ICD-10-CM

## 2016-02-22 DIAGNOSIS — IMO0002 Reserved for concepts with insufficient information to code with codable children: Secondary | ICD-10-CM

## 2016-02-22 DIAGNOSIS — E1140 Type 2 diabetes mellitus with diabetic neuropathy, unspecified: Secondary | ICD-10-CM

## 2016-02-22 DIAGNOSIS — K219 Gastro-esophageal reflux disease without esophagitis: Secondary | ICD-10-CM | POA: Diagnosis not present

## 2016-02-22 DIAGNOSIS — E785 Hyperlipidemia, unspecified: Secondary | ICD-10-CM

## 2016-02-22 DIAGNOSIS — E1165 Type 2 diabetes mellitus with hyperglycemia: Secondary | ICD-10-CM

## 2016-02-22 DIAGNOSIS — F418 Other specified anxiety disorders: Secondary | ICD-10-CM

## 2016-02-22 LAB — COMPREHENSIVE METABOLIC PANEL
ALT: 18 U/L (ref 0–35)
AST: 20 U/L (ref 0–37)
Albumin: 3.9 g/dL (ref 3.5–5.2)
Alkaline Phosphatase: 55 U/L (ref 39–117)
BUN: 14 mg/dL (ref 6–23)
CO2: 31 mEq/L (ref 19–32)
Calcium: 9.6 mg/dL (ref 8.4–10.5)
Chloride: 104 mEq/L (ref 96–112)
Creatinine, Ser: 0.81 mg/dL (ref 0.40–1.20)
GFR: 89.65 mL/min (ref >60.00)
Glucose, Bld: 112 mg/dL — ABNORMAL HIGH (ref 70–99)
Potassium: 3.5 mEq/L (ref 3.5–5.1)
Sodium: 142 mEq/L (ref 135–145)
Total Bilirubin: 0.3 mg/dL (ref 0.2–1.2)
Total Protein: 7.4 g/dL (ref 6.0–8.3)

## 2016-02-22 LAB — LIPID PANEL
Cholesterol: 142 mg/dL (ref 0–200)
HDL: 31.1 mg/dL — ABNORMAL LOW (ref >39.00)
LDL Cholesterol: 82 mg/dL (ref 0–99)
NonHDL: 111.17
Total CHOL/HDL Ratio: 5
Triglycerides: 148 mg/dL (ref 0.0–149.0)
VLDL: 29.6 mg/dL (ref 0.0–40.0)

## 2016-02-22 LAB — HEPATITIS C ANTIBODY: HCV Ab: NEGATIVE

## 2016-02-22 LAB — HEMOGLOBIN A1C: Hgb A1c MFr Bld: 6.6 % — ABNORMAL HIGH (ref 4.6–6.5)

## 2016-02-22 MED ORDER — ALPRAZOLAM 0.5 MG PO TABS: ORAL_TABLET | ORAL | Status: DC

## 2016-02-22 MED ORDER — PANTOPRAZOLE SODIUM 40 MG PO TBEC: 40.0000 mg | DELAYED_RELEASE_TABLET | Freq: Two times a day (BID) | ORAL | Status: DC

## 2016-02-22 MED ORDER — GLYBURIDE 2.5 MG PO TABS: 2.5000 mg | ORAL_TABLET | Freq: Every day | ORAL | Status: DC

## 2016-02-22 NOTE — Assessment & Plan Note (Signed)
Taking lovastatin 20 mg daily Recheck lipid panel Increase exercise and work on weight loss

## 2016-02-22 NOTE — Patient Instructions (Addendum)
  Test(s) ordered today. Your results will be released to [place] (or called to you) after review, usually within 72hours after test completion. If any changes need to be made, you will be notified at that same time.  Increase your exercise and work on weight loss.   Medications reviewed and updated.  Changes include discontinuing the metformin, increasing the glyburide to one pill daily, increasing the pantoprazole to twice daily.    Your prescription(s) have been submitted to your pharmacy. Please take as directed and contact our office if you believe you are having problem(s) with the medication(s).  A referral was ordered for GI for evaluation of your heartburn.    Please followup in 6 months

## 2016-02-22 NOTE — Assessment & Plan Note (Signed)
BP well controlled Current regimen effective and well tolerated Continue current medications at current doses CMP 

## 2016-02-22 NOTE — Assessment & Plan Note (Signed)
Controlled, stable Continue current dose of sertraline Xanax only as needed-refill today

## 2016-02-22 NOTE — Progress Notes (Signed)
Subjective:    Patient ID: Heidi Franklin, female    DOB: 05-Aug-1946, 70 y.o.   MRN: [id number]  HPI She is here for follow up.  GERD:  She has a bad taste in her mouth.  She feels GERD few times a week.  She is taking her medication daily  -she does feel the pantoprazole was more effective than the omeprazole.  She takes advil occasionally - about twice a week.  She sometimes is compliant with a GERD diet.  She drinks ginger ale and sprite daily.   Diarrhea:  She has had diarrhea since starting metformin.  She is taking anti-diarrheal medication. She feels the diarrhea is related to the medication.  Diabetes: She is taking her medication daily as prescribed. She is compliant with a diabetic diet. She is not exercising regularly. She monitors her sugars and they have been running 122 -144, rarely 180's. She is up-to-date with an ophthalmology examination - [facility].   Hypertension: She is taking her medication daily. She is compliant with a low sodium diet.  She denies chest pain, palpitations, edema, shortness of breath and regular headaches. She is not exercising regularly.    Hyperlipidemia: She is taking her medication daily. She is compliant with a low fat/cholesterol diet. She is not exercising regularly.   Depression, anxiety: She is taking her medication daily as prescribed. She denies any side effects from the medication. She feels her depression and anxiety are well controlled and she is happy with her current dose of medication. She takes the xanax only as needed-she does need a refill today.     Medications and allergies reviewed with patient and updated if appropriate.  Patient Active Problem List   Diagnosis Date Noted  . Acute bronchitis 12/15/2015  . Peroneal tendinitis of left lower extremity 09/05/2015  . Recurrent falls 08/08/2015  . Bursitis of left shoulder 07/13/2015  . Obesity 04/16/2015  . Nonspecific abnormal electrocardiogram (ECG) (EKG)  05/11/2011  . Tenosynovitis of finger 04/22/2011  . Type 2 diabetes, uncontrolled, with neuropathy ([place]) 03/02/2010  . Hyperlipidemia associated with type 2 diabetes mellitus ([place]) 03/02/2010  . Depression with anxiety 03/02/2010  . Essential hypertension 03/02/2010  . Chronic rhinitis 03/02/2010  . Upper airway cough syndrome 03/02/2010  . GERD 03/02/2010    Current Outpatient Prescriptions on File Prior to Visit  Medication Sig Dispense Refill  . albuterol (PROVENTIL HFA;VENTOLIN HFA) 108 (90 BASE) MCG/ACT inhaler Inhale 2 puffs into the lungs every 4 (four) hours as needed for wheezing or shortness of breath.    . Alcohol Swabs 70 % PADS Use to clean site to check blood sugars Dx e11.9 100 each 3  . ALPRAZolam (XANAX) 0.5 MG tablet Take 1 tablet by mouth every 8 hours if needed for anxiety. 30 tablet 2  . amLODipine (NORVASC) 10 MG tablet Take 1 tablet (10 mg total) by mouth daily. (Patient taking differently: Take 10 mg by mouth every morning. ) 90 tablet 3  . aspirin 81 MG tablet Take 81 mg by mouth every morning.     . Calcium Citrate-Vitamin D (CALCIUM + D PO) Take 1 tablet by mouth every morning.    . chlorpheniramine (CHLOR-TRIMETON) 4 MG tablet Take 2 tabs by mouth at bedtime and every 4 hours as needed for drainage/drippy nose/sneezing    . dextromethorphan (DELSYM) 30 MG/5ML liquid Take 2 tsp every 12 hours as needed with flutter valve for cough    . famotidine (PEPCID) 20 MG tablet  Take 1 tablet (20 mg total) by mouth at bedtime. 30 tablet 11  . fluticasone (FLONASE) 50 MCG/ACT nasal spray Place 1 spray into both nostrils 2 (two) times daily. 48 g 1  . glucose blood (ONE TOUCH ULTRA TEST) test strip 1 each by Other route daily. Use 1 strips to check blood sugar twice a day Ex E11.9 100 each 3  . glyBURIDE (DIABETA) 2.5 MG tablet Take 1 tablet (2.5 mg total) by mouth daily with breakfast. (Patient taking differently: Take 1.25 mg by mouth daily with breakfast. ) 30 tablet 5  .  HYDROcodone-homatropine (HYDROMET) 5-1.5 MG/5ML syrup Take 5 mLs by mouth every 6 (six) hours as needed. 240 mL 0  . Lancets (ONETOUCH ULTRASOFT) lancets PRN 100 each 3  . lovastatin (MEVACOR) 20 MG tablet Take 1 tablet (20 mg total) by mouth at bedtime. 90 tablet 3  . metFORMIN (GLUCOPHAGE) 500 MG tablet Take 1 tablet (500 mg total) by mouth 2 (two) times daily with a meal. 60 tablet 2  . Multiple Vitamins-Minerals (CENTRUM SILVER PO) Take 1 tablet by mouth every morning.    . pantoprazole (PROTONIX) 40 MG tablet Take 1 tablet (40 mg total) by mouth daily. 90 tablet 1  . potassium chloride SA (K-DUR,KLOR-CON) 20 MEQ tablet Take 1 tablet (20 mEq total) by mouth daily. 90 tablet 1  . Respiratory Therapy Supplies (FLUTTER) DEVI Use as directed 1 each 0  . sertraline (ZOLOFT) 100 MG tablet TAKE 1 TABLET EVERY MORNING 90 tablet 1  . valsartan-hydrochlorothiazide (DIOVAN-HCT) 160-25 MG tablet TAKE 1 TABLET EVERY DAY 90 tablet 3   No current facility-administered medications on file prior to visit.    Past Medical History  Diagnosis Date  . BRONCHITIS, CHRONIC   . ALLERGIC RHINITIS   . Anxiety state, unspecified   . DEPRESSION   . DIABETES MELLITUS, TYPE II   . GERD   . HYPERLIPIDEMIA   . HYPERTENSION     Past Surgical History  Procedure Laterality Date  . Abdominal hysterectomy  1980  . Trigger finger release      Index finger    Social History   Social History  . Marital Status: Married    Spouse Name: N/A  . Number of Children: 0  . Years of Education: 38yr colge   Occupational History  . Retired     Social History Main Topics  . Smoking status: Never Smoker   . Smokeless tobacco: Never Used  . Alcohol Use: No  . Drug Use: No  . Sexual Activity: Not Asked   Other Topics Concern  . None   Social History Narrative   Married, lives with spouse-retired from [employer] insurance   Not employed    Drinks coffee occasional, Consumes 1 soda a day     Family History  Problem  Relation Age of Onset  . Stroke Mother   . Diabetes Father   . Hyperlipidemia Other     Parent  . Hypertension Other     Parent  . Colon cancer Neg Hx   . Esophageal cancer Neg Hx   . Rectal cancer Neg Hx   . Stomach cancer Neg Hx   . Pancreatic cancer Neg Hx     Review of Systems  Constitutional: Negative for fever.  Respiratory: Positive for shortness of breath (chronic) and wheezing. Negative for cough.   Cardiovascular: Negative for chest pain, palpitations and leg swelling.  Gastrointestinal: Positive for diarrhea. Negative for nausea (belching) and abdominal pain.  Gerd  Neurological: Negative for light-headedness and numbness.       Objective:   Filed Vitals:   02/22/16 0924  BP: 132/64  Pulse: 97  Temp: 98.3 F (36.8 C)  Resp: 16   Filed Weights   02/22/16 0924  Weight: 161 lb (73.029 kg)   Body mass index is 31.44 kg/(m^2).   Physical Exam Constitutional: Appears well-developed and well-nourished. No distress.  Neck: Neck supple. No tracheal deviation present. No thyromegaly present.  No carotid bruit. No cervical adenopathy.   Cardiovascular: Normal rate, regular rhythm and normal heart sounds.   No murmur heard.  No edema Pulmonary/Chest: Effort normal and breath sounds normal. No respiratory distress. No wheezes.  Abdomen: soft, non-tender, obese     Assessment & Plan:   See Problem List for Assessment and Plan of chronic medical problems.  Follow up in 6 months

## 2016-02-22 NOTE — Progress Notes (Signed)
Pre visit review using our clinic review tool, if applicable. No additional management support is needed unless otherwise documented below in the visit note. 

## 2016-02-22 NOTE — Assessment & Plan Note (Signed)
Sugars have been controlled Not tolerating metformin secondary to diarrhea-we'll discontinue Continue glyburide-increased to 2.5 mg daily-discussed risk of hypoglycemia She does not want expensive medication so we will see how she does with the glyburide alone. If she experiences hypoglycemia-we'll change to a different medication Check A1c Stressed exercise and weight loss

## 2016-02-22 NOTE — Assessment & Plan Note (Signed)
She has had GERD for years Currently not controlled Increase pantoprazole to twice daily Continue Pepcid once daily Stressed GERD diet, keeping NSAIDs to a minimum and avoid eating within 3 hours of labor We will refer to GI for possible EGD

## 2016-02-23 ENCOUNTER — Encounter: Payer: Self-pay | Admitting: Internal Medicine

## 2016-02-23 ENCOUNTER — Ambulatory Visit (INDEPENDENT_AMBULATORY_CARE_PROVIDER_SITE_OTHER): Payer: Medicare Other | Admitting: Internal Medicine

## 2016-02-23 VITALS — BP 108/66 | HR 93 | Ht 60.0 in | Wt 162.6 lb

## 2016-02-23 DIAGNOSIS — R05 Cough: Secondary | ICD-10-CM | POA: Diagnosis not present

## 2016-02-23 DIAGNOSIS — R058 Other specified cough: Secondary | ICD-10-CM

## 2016-02-23 NOTE — Assessment & Plan Note (Signed)
Complicated by HBP/ Hyperlipidemia/ DM   Body mass index is 31.76    Lab Results  Component Value Date   TSH 2.26 05/02/2015     Contributing to gerd tendency/ doe/reviewed the need and the process to achieve and maintain neg calorie balance > defer f/u primary care including intermittently monitoring thyroid status

## 2016-02-23 NOTE — Patient Instructions (Signed)
No change in medications   See calendar for specific medication instructions and bring it back for each and every office visit for every healthcare provider you see.  Without it,  you may not receive the best quality medical care that we feel you deserve.  You will note that the calendar groups together  your maintenance  medications that are timed at particular times of the day.  Think of this as your checklist for what your doctor has instructed you to do until your next evaluation to see what benefit  there is  to staying on a consistent group of medications intended to keep you well.  The other group at the bottom is entirely up to you to use as you see fit  for specific symptoms that may arise between visits that require you to treat them on an as needed basis.  Think of this as your action plan or "what if" list.   Separating the top medications from the bottom group is fundamental to providing you adequate care going forward.    See [name] NP w/in 3 months with all your medications, even over the counter meds, separated in two separate bags, the ones you take no matter what vs the ones you stop once you feel better and take only as needed when you feel you need them.   [name]  will generate for you a new user friendly medication calendar that will put [place] all on the same page re: your medication use.

## 2016-02-23 NOTE — Assessment & Plan Note (Signed)
CT sinus 11/08/11>>Negative paranasal sinuses.  Rightward nasal septal deviation and possible rhinitis. 01/13/12>>  RAST negative, IgE 22.7 PFT 11/07/11>>FEV1 1.79 (102%),  Ratio 82, TLC 4.18 (104%), DLCO 69%, positive BD only in terms of fef 25/75 -med calendar 11/23/2012 , 03/18/2013 , 08/26/2013 , 12/06/2013, 06/21/2014 > not keeping up with it  -methacholine challenge test  12/24/13 > neg  - try off singulair 12/31/2013 > no worse  - added neurontin 100 mg qid  05/20/14 but did not take consistently as of 06/30/2014 > increased to 100 qid 08/23/2014 improved then stopped it and flared spirng of 2016  - 07/05/2015 increase neurontin 300 tid and added elavil 10 mg at hs > not clear she did this at f/u ov 08/03/2015  - added flutter valve 08/03/2015  - neurontin/ultram /elavil stopped 07/2015 due to falls > no worse 12/04/2015      Back to previous pattern now of brief episodes better p rx with prednisone only to worsen again despite maint rx for GERD and next step is to see GI   Of the three most common causes of chronic cough, only one (GERD)  can actually cause the other two (asthma and post nasal drip syndrome)  and perpetuate the cylce of cough inducing airway trauma, inflammation, heightened sensitivity to reflux which is prompted by the cough itself via a cyclical mechanism.    This may partially respond to steroids and look like asthma and post nasal drainage but never erradicated completely unless the cough and the secondary reflux are eliminated, preferably both at the same time.  While not intuitively obvious, many patients with chronic low grade reflux do not cough until there is a secondary insult that disturbs the protective epithelial barrier and exposes sensitive nerve endings.  This can be viral or direct physical injury such as with an endotracheal tube.   The point is that once this occurs, it is difficult to eliminate using anything but a maximally effective acid suppression regimen at least in  the short run, accompanied by an appropriate diet to address non acid GERD.   No change in rx needed for now pending gi eval  I had an extended discussion with the patient reviewing all relevant studies completed to date and  lasting 15 to 20 minutes of a 25 minute visit    Each maintenance medication was reviewed in detail including most importantly the difference between maintenance and prns and under what circumstances the prns are to be triggered using an action plan format that is not reflected in the computer generated alphabetically organized AVS but trather by a customized med calendar that reflects the AVS meds with confirmed 100% correlation.   Please see instructions for details which were reviewed in writing and the patient given a copy highlighting the part that I personally wrote and discussed at today's ov.

## 2016-02-23 NOTE — Progress Notes (Signed)
Subjective:    Patient ID: Heidi Franklin, female   DOB: 10-Mar-1946    MRN: [id number]  Brief patient profile:  70  yobf never smoker with chronic cough  since age 70's only resolves for a week or two  at a time then recurs daily,  dx as  post-nasal drip with nasal septal deviation, and GERD with neg MCT 11/2013   Workup for cough : CT sinus 11/08/11>>Negative paranasal sinuses.  Rightward nasal septal deviation and possible rhinitis. 01/13/12>>  RAST negative, IgE 22.7 PFT 11/07/11>>FEV1 1.79 (102%),  Ratio 82, TLC 4.18 (104%), DLCO 69%, positive BD only in terms of fef 25/75 -med calendar 11/23/2012 , 03/18/2013 , 08/26/2013 , 12/06/2013, 06/21/2014 > not keeping up with it  -methacholine challenge test  12/24/13 > neg  - try off singulair 12/31/2013 > no worse  - added neurontin 100 mg qid  05/20/14 but did not take consistently as of 06/30/2014 > increased to 100 qid 08/23/2014 improved then stopped it and flared spirng of 2016  - 07/05/2015 increase neurontin 300 tid and added elavil 10 mg at hs > not clear she did this at f/u ov 08/03/2015  - added flutter valve 08/03/2015  -08/2015 neurontin /tramadol stopped due to falls.      History of Present Illness  08/03/2012 f/u ov/Cherish Runde cc cough for decades, better on present rx than for many years prior to pulmonary clinic changes, not using calendar, poor hfa  Technique but no limiting sob.  rec Change zyrtec to where you take it only if needed for itching/ sneezing  Work on inhaler technique:      Ok to use symbicort with spacer if not improving     12/06/2013 NP Follow up and Med review  Patient returns for a followup visit and medication review. We reviewed all her medications organized them into a medication calendar with patient education. Appears the patient is taking her medications correctly. Since last visit. Patient reports that she is doing better. Cough is not as bad. Last ov Symbicort was changed to As needed  Use only.  She is using on average  every other day. Not sure if she needs or not.  No fever, chest pain , edema or wheezing. No xopenex use since last ov.  rec MCT > neg > d/c symbicort       12/04/2015  f/u ov/Kassity Woodson re:  Cough x 70 years  Chief Complaint  Patient presents with  . Follow-up    Cough has improved some. No new co's today.   chlorpheniramine best way to stop the tickle  Only uses it maybe once a day  Very rarely use saba now >chlor tabs   12/15/2015 Acute OV  Pt presents for an acute office visit. Complains of chest tightness, sinus drainage, SOB, dry cough starting on 12/09/15. Denies any chest congestion, sinus pressure, fever, nausea or vomiting. Currently taking a zpak and pred taper given by MW on 12/12/15. CXR today shows bronchitic changes  Says she ran out of hydromet , helped some. Wants refill.  Denies chest pain, orthopnea, edema or fever.  rec Finish Prednisone and Zpack .  Delsym 2 tsp Twice daily  As needed  Cough  Chlortrimeton 4mg  1 every 4hrs as needed for drainage.  Mucinex Twice daily  As needed  Congestion .  Hydromet 1 tsp every 6hr as needed. - may make you sleepy.  Please contact office for sooner follow up if symptoms do not improve or worsen or seek emergency  care    NP acute ov  02/05/16 We will call in a prednisone taper Prednisone taper; 10 mg tablets: 4 tabs x 2 days, 3 tabs x 2 days, 2 tabs x 2 days 1 tab x 2 days then stop. Zpack Use the hydromet cough syrup 1 teaspoon for sleep at night every 4-6 hours.   02/23/2016  f/u ov/Yashica Sterbenz re: cough x 49 y c/w uacs/  Neg MCT   Chief Complaint  Patient presents with  . Follow-up    Breathing has returned to baseline. She is coughing much less. She has not needed albuterol.   wakes up once or twice a twice weekly coughing but not needing saba at all  Still doe= MMRC2 = can't walk a nl pace on a flat grade s sob but does fine slow and flat eg [employer] shopping   No obvious day to day or daytime variability or assoc excess/ purulent  sputum or mucus plugs or hemoptysis or cp or chest tightness, subjective wheeze or overt sinus or hb symptoms. No unusual exp hx or h/o childhood pna/ asthma or knowledge of premature birth.  Sleeping ok without nocturnal  or early am exacerbation  of respiratory  c/o's or need for noct saba. Also denies any obvious fluctuation of symptoms with weather or environmental changes or other aggravating or alleviating factors except as outlined above   Current Medications, Allergies, Complete Past Medical History, Past Surgical History, Family History, and Social History were reviewed in [employer] record.  ROS  The following are not active complaints unless bolded sore throat, dysphagia, dental problems, itching, sneezing,  nasal congestion or excess/ purulent secretions, ear ache,   fever, chills, sweats, unintended wt loss, classically pleuritic or exertional cp,  orthopnea pnd or leg swelling, presyncope, palpitations, abdominal pain, anorexia, nausea, vomiting, diarrhea  or change in bowel or bladder habits, change in stools or urine, dysuria,hematuria,  rash, arthralgias, visual complaints, headache, numbness, weakness or ataxia or problems with walking or coordination,  change in mood/affect or memory.                    Objective:   Physical Exam   Exam:  GEN: A/Ox3; pleasant , NAD,  amb bf nad  Extremely hoarse     . 05/20/2014       168   >168 06/21/2014 >  06/30/2014  168 > 08/23/2014  170 >183 03/20/2015 > 04/10/2015 180 >181 05/02/2015 >  07/05/2015 177  > 08/03/2015 171 > 12/04/2015  166 >165 12/15/2015 > 02/23/2016  163  Vital signs reviewed  HEENT:  [place]/AT,  EACs    , NOSE-moderate bilateral non-specific turbinate edema    THROAT- minimal cobblestoning, clear pnd, no  exudate noted.   NECK:  Supple w/ fair ROM; no JVD; normal carotid impulses w/o bruits; no thyromegaly or nodules palpated; no lymphadenopathy.  RESP  Clear to A and P with no accessory muscle use, no  dullness to percussion,    CARD:  RRR, no m/r/g  , no peripheral edema, pulses intact, no cyanosis or clubbing.  GI:   Obese/soft & nt; nml bowel sounds; no organomegaly or masses detected.  Musco: Warm bil, no deformities or joint swelling noted.   Neuro: alert, no focal deficits noted.    Skin: Warm, no lesions or rashes            Assessment:

## 2016-02-24 ENCOUNTER — Encounter: Payer: Self-pay | Admitting: Internal Medicine

## 2016-02-29 ENCOUNTER — Other Ambulatory Visit: Payer: Self-pay | Admitting: Internal Medicine

## 2016-04-07 ENCOUNTER — Other Ambulatory Visit: Payer: Self-pay | Admitting: Internal Medicine

## 2016-04-10 ENCOUNTER — Ambulatory Visit (INDEPENDENT_AMBULATORY_CARE_PROVIDER_SITE_OTHER): Payer: Medicare Other | Admitting: Family Medicine

## 2016-04-10 ENCOUNTER — Other Ambulatory Visit: Payer: Self-pay

## 2016-04-10 ENCOUNTER — Encounter: Payer: Self-pay | Admitting: Family Medicine

## 2016-04-10 ENCOUNTER — Ambulatory Visit (INDEPENDENT_AMBULATORY_CARE_PROVIDER_SITE_OTHER)
Admission: RE | Admit: 2016-04-10 | Discharge: 2016-04-10 | Disposition: A | Discharge status: Home / Self Care | Payer: Medicare Other | Source: Ambulatory Visit | Attending: Family Medicine | Admitting: Family Medicine

## 2016-04-10 VITALS — BP 122/62 | HR 105 | Ht 60.0 in | Wt 160.0 lb

## 2016-04-10 DIAGNOSIS — M1712 Unilateral primary osteoarthritis, left knee: Secondary | ICD-10-CM | POA: Insufficient documentation

## 2016-04-10 DIAGNOSIS — M7552 Bursitis of left shoulder: Secondary | ICD-10-CM | POA: Diagnosis not present

## 2016-04-10 DIAGNOSIS — M25562 Pain in left knee: Secondary | ICD-10-CM

## 2016-04-10 DIAGNOSIS — M179 Osteoarthritis of knee, unspecified: Secondary | ICD-10-CM | POA: Diagnosis not present

## 2016-04-10 NOTE — Progress Notes (Signed)
[name] [address] [place], [place] [address] Phone: [phone] Subjective:      CC: Left shoulder pain  Left knee pain  [id number] Heidi Franklin is a 70 y.o. female coming in with complaint of left left shoulder pain. Patient has had subacromial bursitis previously. Has responded well to injections. Last one was greater than 7 months ago. Patient states that over the course last several weeks it having increasing pain. Waking him up at night. Denies any radiation down the arm. States that she has full strength. Seems to be worse when reaching across her body or of high. Has not notice any weakness.   Patient is also complaining of left knee pain. Has been going on for several months. Seems to be worsening recently. States that going up and down stairs is been very difficult. Sometimes feels like it is somewhat unstable. Possible swelling. Does not remember any injury. Some mild radiation down the leg. No associated back pain. Rates the severity of pain a 6 out of 10. No nighttime awakening. Has avoided over-the-counter anti-inflammatories.   Past Medical History  Diagnosis Date  . BRONCHITIS, CHRONIC   . ALLERGIC RHINITIS   . Anxiety state, unspecified   . DEPRESSION   . DIABETES MELLITUS, TYPE II   . GERD   . HYPERLIPIDEMIA   . HYPERTENSION    Past Surgical History  Procedure Laterality Date  . Abdominal hysterectomy  1980  . Trigger finger release      Index finger   Social History  Substance Use Topics  . Smoking status: Never Smoker   . Smokeless tobacco: Never Used  . Alcohol Use: No   Allergies  Allergen Reactions  . Metformin And Related Diarrhea  . Penicillins Rash   Family History  Problem Relation Age of Onset  . Stroke Mother   . Diabetes Father   . Hyperlipidemia Other     Parent  . Hypertension Other     Parent  . Colon cancer Neg Hx   . Esophageal cancer Neg Hx   . Rectal cancer Neg Hx   . Stomach cancer  Neg Hx   . Pancreatic cancer Neg Hx         Past medical history, social, surgical and family history all reviewed in electronic medical record.   Review of Systems: No headache, visual changes, nausea, vomiting, diarrhea, constipation, dizziness, abdominal pain, skin rash, fevers, chills, night sweats, weight loss, swollen lymph nodes, body aches, joint swelling, muscle aches, chest pain, shortness of breath, mood changes.   Objective Blood pressure 122/62, pulse 105, height 5' (1.524 m), weight 160 lb (72.576 kg), SpO2 97 %.  General: No apparent distress alert and oriented x3 mood and affect normal, dressed appropriately.  HEENT: Pupils equal, extraocular movements intact  Respiratory: Patient's speak in full sentences and does not appear short of breath  Cardiovascular: No lower extremity edema, non tender, no erythema  Skin: Warm dry intact with no signs of infection or rash on extremities or on axial skeleton.  Abdomen: Soft nontender  Neuro: Cranial nerves II through XII are intact, neurovascularly intact in all extremities with 2+ DTRs and 2+ pulses.  Lymph: No lymphadenopathy of posterior or anterior cervical chain or axillae bilaterally.  Gait normal with good balance and coordination.  MSK:  Non tender with full range of motion and good stability and symmetric strength and tone of  elbows, wrist, hip, bilaterally. Arthritic changes of multiple joints Shoulder: left Inspection  reveals no abnormalities, atrophy or asymmetry. Palpation is normal with no tenderness over AC joint or bicipital groove. ROM is full in all planes passively. Rotator cuff strength normal throughout. Worsening signs of impingement No labral pathology noted with negative Obrien's, negative clunk and good stability. Normal scapular function observed. No painful arc and no drop arm sign. No apprehension sign Contralateral shoulder unremarkable  Knee:left Osteophytic changes with mild valgus  deformity Tender over the medial joint line ROM full in flexion and extension and lower leg rotation. Mild instability with valgus stress. Positive Mcmurray's, Apley's, and Thessalonian tests. Mild painful patellar compression. Patellar glide with moderatecrepitus. Patellar and quadriceps tendons unremarkable. Hamstring and quadriceps strength is normal.  Contralateral knee unremarkable  Procedure: Real-time Ultrasound Guided Injection of left glenohumeral joint Device: GE Logiq E  Ultrasound guided injection is preferred based studies that show increased duration, increased effect, greater accuracy, decreased procedural pain, increased response rate with ultrasound guided versus blind injection.  Verbal informed consent obtained.  Time-out conducted.  Noted no overlying erythema, induration, or other signs of local infection.  Skin prepped in a sterile fashion.  Local anesthesia: Topical Ethyl chloride.  With sterile technique and under real time ultrasound guidance:  Joint visualized.  23g 1  inch needle inserted posterior approach. Pictures taken for needle placement. Patient did have injection of 2 cc of 1% lidocaine, 2 cc of 0.5% Marcaine, and 1cc of Kenalog 40 mg/dL. Completed without difficulty  Pain immediately resolved suggesting accurate placement of the medication.  Advised to call if fevers/chills, erythema, induration, drainage, or persistent bleeding.  Images permanently stored and available for review in the ultrasound unit.  Impression: Technically successful ultrasound guided injection.     After informed written and verbal consent, patient was seated on exam table. Left knee was prepped with alcohol swab and utilizing anterolateral approach, patient's left knee space was injected with 4:1  marcaine 0.5%: Kenalog 40mg /dL. Patient tolerated the procedure well without immediate complications.   Impression and Recommendations:     This case required medical decision  making of moderate complexity.

## 2016-04-10 NOTE — Assessment & Plan Note (Signed)
Patient given injection today and tolerated the procedure well. We discussed icing regimen. Patient declined formal physical therapy. X-rays are pending. We discussed the possibility of stability bracing.patient will make to avoid this as well at this time. Follow-up in 4 weeks.

## 2016-04-10 NOTE — Assessment & Plan Note (Signed)
Patient was given an injection again today. Asian tolerated the procedure well. We discussed icing regimen. Discussed subjective is doing which ones to avoid. Patient declined formal physical therapy again. Has tried topical anti-inflammatories with no significant improvement. We'll continue to monitor otherwise. Patient will watch blood sugars a little closer. Follow-up again in 4 weeks.

## 2016-04-10 NOTE — Patient Instructions (Signed)
Good ot see yo u Injected the shoulder again today  Do the exercises 3 times a week Ice 20 minutes 2 times daily. Usually after activity and before bed. For the knee I think you do have bad arthritis and a meniscal tear.  We will get an xray today  Injected the knee and watch the blood sugars as well for the next 3 days.  See me again in 4 weeks and if knee is not a lot better we will consider other injections and bracing.

## 2016-04-10 NOTE — Progress Notes (Signed)
Pre visit review using our clinic review tool, if applicable. No additional management support is needed unless otherwise documented below in the visit note. 

## 2016-04-16 ENCOUNTER — Other Ambulatory Visit: Payer: Self-pay | Admitting: Internal Medicine

## 2016-04-18 ENCOUNTER — Ambulatory Visit: Payer: Medicare Other | Admitting: Internal Medicine

## 2016-05-07 ENCOUNTER — Other Ambulatory Visit: Payer: Self-pay

## 2016-05-07 MED ORDER — PANTOPRAZOLE SODIUM 40 MG PO TBEC: 40.0000 mg | DELAYED_RELEASE_TABLET | Freq: Two times a day (BID) | ORAL | Status: DC

## 2016-05-08 ENCOUNTER — Ambulatory Visit (INDEPENDENT_AMBULATORY_CARE_PROVIDER_SITE_OTHER): Payer: Medicare Other | Admitting: Family Medicine

## 2016-05-08 ENCOUNTER — Encounter: Payer: Self-pay | Admitting: Family Medicine

## 2016-05-08 VITALS — BP 118/76 | HR 84 | Wt 160.0 lb

## 2016-05-08 DIAGNOSIS — M7552 Bursitis of left shoulder: Secondary | ICD-10-CM

## 2016-05-08 DIAGNOSIS — M1712 Unilateral primary osteoarthritis, left knee: Secondary | ICD-10-CM | POA: Diagnosis not present

## 2016-05-08 NOTE — Progress Notes (Signed)
[name] [address], [place] [address] Phone: [phone] Subjective:      CC: Left shoulder pain  Left knee pain  [id number] Heidi Franklin is a 70 y.o. female coming in with complaint of left left shoulder pain. Patient has had subacromial bursitis previously. Patient did have injection 4 weeks ago. States that the pain is completely resolved at the moment. Able to do all daily activities.   Patient is also complaining of left knee pain. Patient was found to have mild to moderate osteophytic changes. Patient elected to have an injection. States that she is 70-80% better. Still mild discomfort. Mild instability as well. Still very active though. Patient states that daily activities seem to be easier than what they were prior to the injection.   Past Medical History  Diagnosis Date  . BRONCHITIS, CHRONIC   . ALLERGIC RHINITIS   . Anxiety state, unspecified   . DEPRESSION   . DIABETES MELLITUS, TYPE II   . GERD   . HYPERLIPIDEMIA   . HYPERTENSION    Past Surgical History  Procedure Laterality Date  . Abdominal hysterectomy  1980  . Trigger finger release      Index finger   Social History  Substance Use Topics  . Smoking status: Never Smoker   . Smokeless tobacco: Never Used  . Alcohol Use: No   Allergies  Allergen Reactions  . Metformin And Related Diarrhea  . Penicillins Rash   Family History  Problem Relation Age of Onset  . Stroke Mother   . Diabetes Father   . Hyperlipidemia Other     Parent  . Hypertension Other     Parent  . Colon cancer Neg Hx   . Esophageal cancer Neg Hx   . Rectal cancer Neg Hx   . Stomach cancer Neg Hx   . Pancreatic cancer Neg Hx         Past medical history, social, surgical and family history all reviewed in electronic medical record.   Review of Systems: No headache, visual changes, nausea, vomiting, diarrhea, constipation, dizziness, abdominal pain, skin rash, fevers, chills,  night sweats, weight loss, swollen lymph nodes, body aches, joint swelling, muscle aches, chest pain, shortness of breath, mood changes.   Objective Blood pressure 118/76, pulse 84, weight 160 lb (72.576 kg).  General: No apparent distress alert and oriented x3 mood and affect normal, dressed appropriately.  HEENT: Pupils equal, extraocular movements intact  Respiratory: Patient's speak in full sentences and does not appear short of breath  Cardiovascular: No lower extremity edema, non tender, no erythema  Skin: Warm dry intact with no signs of infection or rash on extremities or on axial skeleton.  Abdomen: Soft nontender  Neuro: Cranial nerves II through XII are intact, neurovascularly intact in all extremities with 2+ DTRs and 2+ pulses.  Lymph: No lymphadenopathy of posterior or anterior cervical chain or axillae bilaterally.  Gait normal with good balance and coordination.  MSK:  Non tender with full range of motion and good stability and symmetric strength and tone of  elbows, wrist, hip, bilaterally. Arthritic changes of multiple joints Shoulder: left Inspection reveals no abnormalities, atrophy or asymmetry. Palpation is normal with no tenderness over AC joint or bicipital groove. ROM is full in all planes passively. Rotator cuff strength normal throughout. No impingement No labral pathology noted with negative Obrien's, negative clunk and good stability. Normal scapular function observed. No painful arc and no drop arm sign. No apprehension  sign Contralateral shoulder unremarkable  Knee:left Osteophytic changes with mild valgus deformity Still some mild tenderness over the medial joint line ROM full in flexion and extension and lower leg rotation. Mild instability with valgus stress. Positive Mcmurray's, Apley's, and Thessalonian tests. Mild painful patellar compression. Patellar glide with mild crepitus. Patellar and quadriceps tendons unremarkable. Hamstring and  quadriceps strength is normal.  Contralateral knee unremarkable     Impression and Recommendations:     This case required medical decision making of moderate complexity.

## 2016-05-08 NOTE — Patient Instructions (Signed)
Great to see you  You are doing great  Keep doing the cream 2 times a day as needed Ice when you need it Stay active See me again in 2 months and we can inject the knee again if needed.

## 2016-05-08 NOTE — Assessment & Plan Note (Signed)
Resolve after injection. We'll continue to monitor. Follow-up in 2 months. Continue conservative therapy.

## 2016-05-08 NOTE — Assessment & Plan Note (Signed)
Patient is doing well one month after the injection. Encourage her to continue with the conservative therapy. Decline formal physical therapy. We discussed icing regimen again as well as given a trial topical anti-inflammatory's. Patient follow-up again in 2 months. At that time if worsening symptoms we can consider another corticosteroid injection.

## 2016-05-16 DIAGNOSIS — L400 Psoriasis vulgaris: Secondary | ICD-10-CM | POA: Diagnosis not present

## 2016-05-28 ENCOUNTER — Ambulatory Visit (INDEPENDENT_AMBULATORY_CARE_PROVIDER_SITE_OTHER): Payer: Medicare Other | Admitting: Family

## 2016-05-28 ENCOUNTER — Encounter: Payer: Self-pay | Admitting: Family

## 2016-05-28 ENCOUNTER — Ambulatory Visit (INDEPENDENT_AMBULATORY_CARE_PROVIDER_SITE_OTHER)
Admission: RE | Admit: 2016-05-28 | Discharge: 2016-05-28 | Disposition: A | Discharge status: Home / Self Care | Payer: Medicare Other | Source: Ambulatory Visit | Attending: Family | Admitting: Family

## 2016-05-28 ENCOUNTER — Encounter: Payer: Medicare Other | Admitting: Adult Health

## 2016-05-28 DIAGNOSIS — M79672 Pain in left foot: Secondary | ICD-10-CM

## 2016-05-28 MED ORDER — DICLOFENAC SODIUM 2 % TD SOLN: 1.0000 "application " | Freq: Two times a day (BID) | TRANSDERMAL | 0 refills | Status: DC | PRN

## 2016-05-28 NOTE — Patient Instructions (Addendum)
Thank you for choosing [facility].  Summary/Instructions:   Ice x 20 minutes every 2 hours as needed and after activity.  Good supportive shoe.   Continue OTC anti-inflammatories as needed for discomfort.  Pennsaid to the are 2x per day as needed.    Please stop by radiology on the basement level of the building for your x-rays. Your results will be released to [facility] (or called to you) after review, usually within 72 hours after test completion. If any treatments or changes are necessary, you will be notified at that same time.  If your symptoms worsen or fail to improve, please contact our office for further instruction, or in case of emergency go directly to the emergency room at the closest medical facility.

## 2016-05-28 NOTE — Assessment & Plan Note (Signed)
Left MTP joint inflammation most likely capsulitis although cannot rule out possible fracture or stress fracture. Obtain x-rays. Treat conservatively with ice and supportive shoe. Continue over-the-counter anti-inflammatories as needed for discomfort. Start sample of Pennsaid is provided. Follow-up pending x-rays or if symptoms worsen or do not improve.

## 2016-05-28 NOTE — Progress Notes (Signed)
Subjective:    Patient ID: Heidi Franklin, female    DOB: 01-26-1946, 70 y.o.   MRN: [id number]  Chief Complaint  Patient presents with  . Foot Pain    starting on sunday night has had left foot pain, did not do anything to injure it that she knows of    HPI:  Heidi Franklin is a 70 y.o. female who  has a past medical history of ALLERGIC RHINITIS; Anxiety state, unspecified; BRONCHITIS, CHRONIC; DEPRESSION; DIABETES MELLITUS, TYPE II; GERD; HYPERLIPIDEMIA; and HYPERTENSION. and presents today for an office visit.   This is a new problem. Associated symptoms of pain located in her left foot has been going on for about 3-days. Denies any trauma.  Pain is described as sharp and dull at the same time. Pain is worst on the lateral side of her foot. Modifying factors include ice and naproxen which has not helped very much. Denies any sounds/sensations heard or felt. No numbness or tingling. Difficult to put weight on it.   Allergies  Allergen Reactions  . Metformin And Related Diarrhea  . Penicillins Rash     Current Outpatient Prescriptions on File Prior to Visit  Medication Sig Dispense Refill  . albuterol (PROVENTIL HFA;VENTOLIN HFA) 108 (90 BASE) MCG/ACT inhaler Inhale 2 puffs into the lungs every 4 (four) hours as needed for wheezing or shortness of breath.    . Alcohol Swabs 70 % PADS Use to clean site to check blood sugars Dx e11.9 100 each 3  . ALPRAZolam (XANAX) 0.5 MG tablet Take 1 tablet by mouth every 8 hours if needed for anxiety. 30 tablet 2  . aspirin 81 MG tablet Take 81 mg by mouth every morning.     . Calcium Citrate-Vitamin D (CALCIUM + D PO) Take 1 tablet by mouth every morning.    . chlorpheniramine (CHLOR-TRIMETON) 4 MG tablet Take 2 tabs by mouth at bedtime and every 4 hours as needed for drainage/drippy nose/sneezing    . dextromethorphan (DELSYM) 30 MG/5ML liquid Take 2 tsp every 12 hours as needed with flutter valve for cough    . famotidine (PEPCID) 20 MG tablet Take  1 tablet (20 mg total) by mouth at bedtime. 30 tablet 11  . fluticasone (FLONASE) 50 MCG/ACT nasal spray Place 1 spray into both nostrils 2 (two) times daily. 48 g 1  . glucose blood (ONE TOUCH ULTRA TEST) test strip 1 each by Other route daily. Use 1 strips to check blood sugar twice a day Ex E11.9 100 each 3  . glyBURIDE (DIABETA) 2.5 MG tablet Take 1 tablet (2.5 mg total) by mouth daily with breakfast. 30 tablet 5  . Lancets (ONETOUCH ULTRASOFT) lancets PRN 100 each 3  . lovastatin (MEVACOR) 20 MG tablet take 1 tablet by mouth at bedtime 90 tablet 3  . Multiple Vitamins-Minerals (CENTRUM SILVER PO) Take 1 tablet by mouth every morning.    . pantoprazole (PROTONIX) 40 MG tablet Take 1 tablet (40 mg total) by mouth 2 (two) times daily before a meal. 180 tablet 1  . potassium chloride SA (K-DUR,KLOR-CON) 20 MEQ tablet TAKE 1 TABLET (20 MEQ TOTAL) BY MOUTH DAILY. 90 tablet 3  . Respiratory Therapy Supplies (FLUTTER) DEVI Use as directed 1 each 0  . sertraline (ZOLOFT) 100 MG tablet TAKE 1 TABLET EVERY MORNING 90 tablet 1  . valsartan-hydrochlorothiazide (DIOVAN-HCT) 160-25 MG tablet TAKE 1 TABLET EVERY DAY 90 tablet 3  . amLODipine (NORVASC) 10 MG tablet TAKE 1 TABLET EVERY DAY 90  tablet 3   No current facility-administered medications on file prior to visit.      Past Surgical History:  Procedure Laterality Date  . ABDOMINAL HYSTERECTOMY  1980  . TRIGGER FINGER RELEASE     Index finger    Past Medical History:  Diagnosis Date  . ALLERGIC RHINITIS   . Anxiety state, unspecified   . BRONCHITIS, CHRONIC   . DEPRESSION   . DIABETES MELLITUS, TYPE II   . GERD   . HYPERLIPIDEMIA   . HYPERTENSION     Review of Systems  Constitutional: Negative for chills and fever.  Musculoskeletal:       Positive for left foot pain.  Neurological: Negative for weakness and numbness.      Objective:    BP 130/70 (BP Location: Left Arm, Patient Position: Sitting, Cuff Size: Normal)   Pulse 85    Temp 98.5 F (36.9 C) (Oral)   Resp 16   Ht 5' (1.524 m)   Wt 163 lb (73.9 kg)   SpO2 98%   BMI 31.83 kg/m  Nursing note and vital signs reviewed.  Physical Exam  Constitutional: She is oriented to person, place, and time. She appears well-developed and well-nourished. No distress.  Cardiovascular: Normal rate, regular rhythm, normal heart sounds and intact distal pulses.   Pulmonary/Chest: Effort normal and breath sounds normal.  Musculoskeletal:  Left foot - no obvious deformity, discoloration, or edema. Tenderness elicited over the fifth MTP joint of left foot and partial tenderness of the fifth metatarsal. No crepitus or deformity noted. Range of motion within normal limits. Capillary refill is intact and appropriate.  Neurological: She is alert and oriented to person, place, and time.  Skin: Skin is warm and dry.  Psychiatric: She has a normal mood and affect. Her behavior is normal. Judgment and thought content normal.       Assessment & Plan:   Problem List Items Addressed This Visit      Other   Foot pain, left    Left MTP joint inflammation most likely capsulitis although cannot rule out possible fracture or stress fracture. Obtain x-rays. Treat conservatively with ice and supportive shoe. Continue over-the-counter anti-inflammatories as needed for discomfort. Start sample of Pennsaid is provided. Follow-up pending x-rays or if symptoms worsen or do not improve.      Relevant Medications   Diclofenac Sodium (PENNSAID) 2 % SOLN   Other Relevant Orders   DG Foot Complete Left (Completed)    Other Visit Diagnoses   None.      I am having Ms. Mckeag start on Diclofenac Sodium. I am also having her maintain her aspirin, chlorpheniramine, dextromethorphan, Multiple Vitamins-Minerals (CENTRUM SILVER PO), albuterol, famotidine, FLUTTER, Calcium Citrate-Vitamin D (CALCIUM + D PO), valsartan-hydrochlorothiazide, glucose blood, onetouch ultrasoft, fluticasone, Alcohol Swabs,  sertraline, glyBURIDE, ALPRAZolam, potassium chloride SA, lovastatin, amLODipine, and pantoprazole.   Follow-up: Return if symptoms worsen or fail to improve.  Mauricio Po, FNP

## 2016-05-29 ENCOUNTER — Encounter: Payer: Self-pay | Admitting: Family

## 2016-06-11 ENCOUNTER — Encounter: Payer: Self-pay | Admitting: Adult Health

## 2016-06-11 ENCOUNTER — Ambulatory Visit (INDEPENDENT_AMBULATORY_CARE_PROVIDER_SITE_OTHER): Payer: Medicare Other | Admitting: Adult Health

## 2016-06-11 DIAGNOSIS — R05 Cough: Secondary | ICD-10-CM | POA: Diagnosis not present

## 2016-06-11 DIAGNOSIS — R058 Other specified cough: Secondary | ICD-10-CM

## 2016-06-11 NOTE — Assessment & Plan Note (Signed)
Chronic cough -controlled without flare on tx aimed at GERD/AR prevention  Patient's medications were reviewed today and patient education was given. Computerized medication calendar was adjusted/completed   Plan  Follow med calendar closely and bring to each visit. Follow up with Dr. Melvyn Novas  In 4-6  months and As needed

## 2016-06-11 NOTE — Patient Instructions (Addendum)
Follow med calendar closely and bring to each visit. Follow up with Dr. Melvyn Novas  In 4-6  months and As needed.

## 2016-06-11 NOTE — Progress Notes (Signed)
Subjective:    Patient ID: Heidi Franklin, female   DOB: 10-Mar-1946    MRN: [id number]  Brief patient profile:  70  yobf never smoker with chronic cough  since age 70's only resolves for a week or two  at a time then recurs daily,  dx as  post-nasal drip with nasal septal deviation, and GERD with neg MCT 11/2013   Workup for cough : CT sinus 11/08/11>>Negative paranasal sinuses.  Rightward nasal septal deviation and possible rhinitis. 01/13/12>>  RAST negative, IgE 22.7 PFT 11/07/11>>FEV1 1.79 (102%),  Ratio 82, TLC 4.18 (104%), DLCO 69%, positive BD only in terms of fef 25/75 -med calendar 11/23/2012 , 03/18/2013 , 08/26/2013 , 12/06/2013, 06/21/2014 > not keeping up with it  -methacholine challenge test  12/24/13 > neg  - try off singulair 12/31/2013 > no worse  - added neurontin 100 mg qid  05/20/14 but did not take consistently as of 06/30/2014 > increased to 100 qid 08/23/2014 improved then stopped it and flared spirng of 2016  - 07/05/2015 increase neurontin 300 tid and added elavil 10 mg at hs > not clear she did this at f/u ov 08/03/2015  - added flutter valve 08/03/2015  -08/2015 neurontin /tramadol stopped due to falls.      History of Present Illness  08/03/2012 f/u ov/Wert cc cough for decades, better on present rx than for many years prior to pulmonary clinic changes, not using calendar, poor hfa  Technique but no limiting sob.  rec Change zyrtec to where you take it only if needed for itching/ sneezing  Work on inhaler technique:      Ok to use symbicort with spacer if not improving     12/06/2013 NP Follow up and Med review  Patient returns for a followup visit and medication review. We reviewed all her medications organized them into a medication calendar with patient education. Appears the patient is taking her medications correctly. Since last visit. Patient reports that she is doing better. Cough is not as bad. Last ov Symbicort was changed to As needed  Use only.  She is using on average  every other day. Not sure if she needs or not.  No fever, chest pain , edema or wheezing. No xopenex use since last ov.  rec MCT > neg > d/c symbicort       12/04/2015  f/u ov/Wert re:  Cough x 71 years  Chief Complaint  Patient presents with  . Follow-up    Cough has improved some. No new co's today.   chlorpheniramine best way to stop the tickle  Only uses it maybe once a day  Very rarely use saba now >chlor tabs   12/15/2015 Acute OV  Pt presents for an acute office visit. Complains of chest tightness, sinus drainage, SOB, dry cough starting on 12/09/15. Denies any chest congestion, sinus pressure, fever, nausea or vomiting. Currently taking a zpak and pred taper given by MW on 12/12/15. CXR today shows bronchitic changes  Says she ran out of hydromet , helped some. Wants refill.  Denies chest pain, orthopnea, edema or fever.  rec Finish Prednisone and Zpack .  Delsym 2 tsp Twice daily  As needed  Cough  Chlortrimeton 4mg  1 every 4hrs as needed for drainage.  Mucinex Twice daily  As needed  Congestion .  Hydromet 1 tsp every 6hr as needed. - may make you sleepy.  Please contact office for sooner follow up if symptoms do not improve or worsen or seek emergency  care    NP acute ov  02/05/16 We will call in a prednisone taper Prednisone taper; 10 mg tablets: 4 tabs x 2 days, 3 tabs x 2 days, 2 tabs x 2 days 1 tab x 2 days then stop. Zpack Use the hydromet cough syrup 1 teaspoon for sleep at night every 4-6 hours.   02/23/2016  f/u ov/Wert re: cough x 50 y c/w uacs/  Neg MCT   Chief Complaint  Patient presents with  . Follow-up    Breathing has returned to baseline. She is coughing much less. She has not needed albuterol.   wakes up once or twice a twice weekly coughing but not needing saba at all  Still doe= MMRC2 = can't walk a nl pace on a flat grade s sob but does fine slow and flat eg [employer] shopping  >>no change   06/11/2016 Follow-up Chronic Cough /Neg MCT  Patient  presents for a four-month follow-up and medication review. She says that she/he been doing well. She's had no flare of her cough.. Denies any chest pain, shortness of breath or wheezing. We reviewed all her medications organize them into a medication count with patient education. Appears to be taking her medications correctly.    Current Medications, Allergies, Complete Past Medical History, Past Surgical History, Family History, and Social History were reviewed in [employer] record.  ROS  The following are not active complaints unless bolded sore throat, dysphagia, dental problems, itching, sneezing,  nasal congestion or excess/ purulent secretions, ear ache,   fever, chills, sweats, unintended wt loss, classically pleuritic or exertional cp,  orthopnea pnd or leg swelling, presyncope, palpitations, abdominal pain, anorexia, nausea, vomiting, diarrhea  or change in bowel or bladder habits, change in stools or urine, dysuria,hematuria,  rash, arthralgias, visual complaints, headache, numbness, weakness or ataxia or problems with walking or coordination,  change in mood/affect or memory.                    Objective:   Physical Exam Vitals:   06/11/16 1550  BP: 134/68  Pulse: 78  Temp: 98.1 F (36.7 C)  TempSrc: Oral  SpO2: 98%  Weight: 163 lb (73.9 kg)  Height: 5' (1.524 m)     Exam:  GEN: A/Ox3; pleasant , NAD,  amb bf nad     . 05/20/2014       168   >168 06/21/2014 >  06/30/2014  168 > 08/23/2014  170 >183 03/20/2015 > 04/10/2015 180 >181 05/02/2015 >  07/05/2015 177  > 08/03/2015 171 > 12/04/2015  166 >165 12/15/2015 > 02/23/2016  163  Vital signs reviewed   HEENT:  [place]/AT,  EACs    , NOSE-moderate bilateral non-specific turbinate edema    THROAT- minimal cobblestoning, clear pnd, no  exudate noted.   NECK:  Supple w/ fair ROM; no JVD; normal carotid impulses w/o bruits; no thyromegaly or nodules palpated; no lymphadenopathy.    RESP  Clear to A and P with no  accessory muscle use, no dullness to percussion,    CARD:  RRR, no m/r/g  , no peripheral edema, pulses intact, no cyanosis or clubbing.  GI:   Obese/soft & nt; nml bowel sounds; no organomegaly or masses detected.   Musco: Warm bil, no deformities or joint swelling noted.   Neuro: alert, no focal deficits noted.    Skin: Warm, no lesions or rashes        Mairany Bruno NP-C  [facility]  06/11/2016

## 2016-06-12 NOTE — Progress Notes (Signed)
Chart and office note reviewed in detail  > agree with a/p as outlined    

## 2016-06-14 NOTE — Addendum Note (Signed)
Addended by: Osa Craver on: 06/14/2016 03:24 PM   Modules accepted: Orders

## 2016-06-19 ENCOUNTER — Ambulatory Visit (INDEPENDENT_AMBULATORY_CARE_PROVIDER_SITE_OTHER): Payer: Medicare Other | Admitting: Internal Medicine

## 2016-06-19 ENCOUNTER — Encounter: Payer: Self-pay | Admitting: Internal Medicine

## 2016-06-19 VITALS — BP 116/58 | HR 100 | Ht 60.0 in | Wt 166.0 lb

## 2016-06-19 DIAGNOSIS — R140 Abdominal distension (gaseous): Secondary | ICD-10-CM

## 2016-06-19 DIAGNOSIS — R1013 Epigastric pain: Secondary | ICD-10-CM

## 2016-06-19 DIAGNOSIS — K219 Gastro-esophageal reflux disease without esophagitis: Secondary | ICD-10-CM

## 2016-06-19 NOTE — Progress Notes (Signed)
[name] [address] [place], [place] [address] Phone: [phone] Subjective:      CC: Left shoulder pain  Left knee pain New right knee pain  [id number]  Heidi Franklin is a 70 y.o. female coming in with complaint of left left shoulder pain. Patient has had subacromial bursitis previously. Patient did have injection 10 weeks ago. States it is still doing very well.   Patient is also complaining of left knee pain. Patient was found to have mild to moderate osteophytic changes. Patient elected to have an injection 10 weeks ago. States it is still doing. Well.  Patient is complaining of new right knee pain. Patient describes the pain as a dull, throbbing aching sensation with some instability. States that this causes her to walk differently and unfortunate because that is causing her some back pain. States that there is tightness in the back of her leg but denies any numbness or weakness. States that she can still do daily activities. When she stops that activity there is no pain.  Xray of knee 6/14 mild to moderate OA. Reviewed and visualized by me.   Past Medical History:  Diagnosis Date  . ALLERGIC RHINITIS   . Anxiety state, unspecified   . BRONCHITIS, CHRONIC   . DEPRESSION   . DIABETES MELLITUS, TYPE II   . GERD   . HYPERLIPIDEMIA   . HYPERTENSION    Past Surgical History:  Procedure Laterality Date  . ABDOMINAL HYSTERECTOMY  1980  . TRIGGER FINGER RELEASE     Index finger   Social History  Substance Use Topics  . Smoking status: Never Smoker  . Smokeless tobacco: Never Used  . Alcohol use No   Allergies  Allergen Reactions  . Metformin And Related Diarrhea  . Penicillins Rash   Family History  Problem Relation Age of Onset  . Stroke Mother   . Diabetes Father   . Hyperlipidemia Other     Parent  . Hypertension Other     Parent  . Diabetes Sister     x1  . Diabetes Brother     x 2  . Colon cancer Neg Hx   .  Esophageal cancer Neg Hx   . Rectal cancer Neg Hx   . Stomach cancer Neg Hx   . Pancreatic cancer Neg Hx         Past medical history, social, surgical and family history all reviewed in electronic medical record.   Review of Systems: No headache, visual changes, nausea, vomiting, diarrhea, constipation, dizziness, abdominal pain, skin rash, fevers, chills, night sweats, weight loss, swollen lymph nodes, body aches, joint swelling, muscle aches, chest pain, shortness of breath, mood changes.   Objective  Blood pressure 130/62, pulse 92, weight 166 lb (75.3 kg), SpO2 97 %.  General: No apparent distress alert and oriented x3 mood and affect normal, dressed appropriately.  HEENT: Pupils equal, extraocular movements intact  Respiratory: Patient's speak in full sentences and does not appear short of breath  Cardiovascular: No lower extremity edema, non tender, no erythema  Skin: Warm dry intact with no signs of infection or rash on extremities or on axial skeleton.  Abdomen: Soft nontender  Neuro: Cranial nerves II through XII are intact, neurovascularly intact in all extremities with 2+ DTRs and 2+ pulses.  Lymph: No lymphadenopathy of posterior or anterior cervical chain or axillae bilaterally.  Gait normal with good balance and coordination.  MSK:  Non tender with full range of motion  and good stability and symmetric strength and tone of  elbows, wrist, hip, bilaterally. Arthritic changes of multiple joints Shoulder: left Inspection reveals no abnormalities, atrophy or asymmetry. Palpation is normal with no tenderness over AC joint or bicipital groove. ROM is full in all planes passively. Rotator cuff strength normal throughout. No impingement No labral pathology noted with negative Obrien's, negative clunk and good stability. Normal scapular function observed. No painful arc and no drop arm sign. No apprehension sign Contralateral shoulder unremarkable  Knee:left Osteophytic  changes with mild valgus deformity Still some mild tenderness over the medial joint line ROM full in flexion and extension and lower leg rotation. Mild instability with valgus stress. Negative Mcmurray's, Apley's, and Thessalonian tests. Mild painful patellar compression. Patellar glide with mild crepitus. Patellar and quadriceps tendons unremarkable. Hamstring and quadriceps strength is normal. Right knee exam shows the patient does have some mild valgus deformity. Little tender to palpation over the medial joint line. Mild valgus deformity and instability with valgus stress. Negative McMurray's test. Crepitus noted with range of motion.  After informed written and verbal consent, patient was seated on exam table. Right knee was prepped with alcohol swab and utilizing anterolateral approach, patient's right knee space was injected with 4:1  marcaine 0.5%: Kenalog 40mg /dL. Patient tolerated the procedure well without immediate complications.   Impression and Recommendations:     This case required medical decision making of moderate complexity.

## 2016-06-19 NOTE — Progress Notes (Signed)
HISTORY OF PRESENT ILLNESS:  Heidi Franklin is a 70 y.o. female who is sent in consultation by her primary care provider Dr. Quay Burow with chief complaint of indigestion. Patient was seen on 1 occasion as an outpatient for screening colonoscopy March 2015. This was normal. Follow-up in 10 years recommended. Patient reports that she's had a greater than one-year history of problems with indigestion. She describes epigastric and chest pressure which is relieved with belching. This occurs once or twice per week. She cannot belch spontaneously but does so after taking carbonated beverages. Relief is immediate. No exertional component, diaphoresis, shortness of breath, or other relevant pertinence. Shows have a history of GERD for which she has been on omeprazole. This was changed to pantoprazole which was somewhat helpful. She does not have pyrosis or dysphagia. She does report a bad taste in her mouth and increased gas as manifested by flatus. Occasional loose stools but no bleeding or other issues. Weight has been stable. Review of outside laboratories from April 2017 reveal unremarkable comprehensive metabolic panel. Hemoglobin A1c 6.6.  REVIEW OF SYSTEMS:  All non-GI ROS negative except for allergies, anxiety, cough, depression, night sweats, sleeping problems, sore throat, urinary leakage  Past Medical History:  Diagnosis Date  . ALLERGIC RHINITIS   . Anxiety state, unspecified   . BRONCHITIS, CHRONIC   . DEPRESSION   . DIABETES MELLITUS, TYPE II   . GERD   . HYPERLIPIDEMIA   . HYPERTENSION     Past Surgical History:  Procedure Laterality Date  . ABDOMINAL HYSTERECTOMY  1980  . TRIGGER FINGER RELEASE     Index finger    Social History Heidi Franklin  reports that she has never smoked. She has never used smokeless tobacco. She reports that she does not drink alcohol or use drugs.  family history includes Diabetes in her brother, father, and sister; Hyperlipidemia in her other; Hypertension in  her other; Stroke in her mother.  Allergies  Allergen Reactions  . Metformin And Related Diarrhea  . Penicillins Rash       PHYSICAL EXAMINATION: Vital signs: BP (!) 116/58   Pulse 100   Ht 5' (1.524 m)   Wt 166 lb (75.3 kg)   BMI 32.42 kg/m   Constitutional: Pleasant, obese, generally well-appearing, no acute distress Psychiatric: alert and oriented x3, cooperative Eyes: extraocular movements intact, anicteric, conjunctiva pink Mouth: oral pharynx moist, no lesions Neck: supple no lymphadenopathy Cardiovascular: heart regular rate and rhythm, no murmur Lungs: clear to auscultation bilaterally Abdomen: soft, nontender, nondistended, no obvious ascites, no peritoneal signs, normal bowel sounds, no organomegaly Rectal: Omitted, Extremities: no clubbing cyanosis or lower extremity edema bilaterally Skin: no lesions on visible extremities Neuro: No focal deficits. Cranial nerves intact  ASSESSMENT:  #1. Dyspepsia consistent with gas bloat #2. GERD by history. Seemingly well controlled on PPI #3. Increased intestinal gas and bloating #4. Normal screening colonoscopy 2015   PLAN:  #1. Schedule upper endoscopy to evaluate dyspepsia, chronic GERD #2. If negative, anti-gas agents as needed #3. Reflux precautions with attention to weight loss #4. Continue PPI or H2 receptor antagonist therapy. Lowest dose required to control classic reflux symptoms. This would not treat gas bloat. I explained this. #5. Routine screening colonoscopy around 2025. Sooner if needed clinically  A copy of this consultation note has been sent to Dr. Quay Burow

## 2016-06-19 NOTE — Patient Instructions (Signed)
You have been scheduled for an endoscopy. Please follow written instructions given to you at your visit today. If you use inhalers (even only as needed), please bring them with you on the day of your procedure.   

## 2016-06-20 ENCOUNTER — Ambulatory Visit (INDEPENDENT_AMBULATORY_CARE_PROVIDER_SITE_OTHER): Payer: Medicare Other | Admitting: Family Medicine

## 2016-06-20 ENCOUNTER — Encounter: Payer: Self-pay | Admitting: Family Medicine

## 2016-06-20 VITALS — BP 130/62 | HR 92 | Wt 166.0 lb

## 2016-06-20 DIAGNOSIS — M1711 Unilateral primary osteoarthritis, right knee: Secondary | ICD-10-CM | POA: Diagnosis not present

## 2016-06-20 DIAGNOSIS — Z23 Encounter for immunization: Secondary | ICD-10-CM

## 2016-06-20 MED ORDER — GABAPENTIN 100 MG PO CAPS: 100.0000 mg | ORAL_CAPSULE | Freq: Every day | ORAL | 3 refills | Status: DC

## 2016-06-20 NOTE — Assessment & Plan Note (Signed)
New problem. Given injection today. Tolerated procedure well. No significant instability but does have arthritic changes noted. Patient given also some gabapentin in case there is any radicular symptoms. I think the patient will do well. We discussed if any custom bracing which patient declined. Follow-up again in 4 weeks. If worsening pain in the knee she may be a candidate for viscous supplementation as well as repeating formal physical therapy.

## 2016-06-20 NOTE — Patient Instructions (Addendum)
Good to see you  I injected your right knee today  Same exercises for both knees 3 times a week  pennsaid pinkie amount topically 2 times daily as needed.  Avoid walking in bad shoes as much as you can .  Gabapentin 100mg  at night for 3 nights and if still having pain then go up to 2 pills at night See me again when something else hurts .

## 2016-06-27 ENCOUNTER — Telehealth: Payer: Self-pay

## 2016-06-27 NOTE — Telephone Encounter (Signed)
Call to Ms. Hall Busing and discussed rescheduling of AWV. Her last AWV was in august; AWV rescheduled for 10/ 11 at 10am;  Will still plan to see Dr. Quay Burow on 10/31 at 8am

## 2016-07-02 ENCOUNTER — Ambulatory Visit (INDEPENDENT_AMBULATORY_CARE_PROVIDER_SITE_OTHER): Payer: Medicare Other | Admitting: Podiatry

## 2016-07-02 ENCOUNTER — Encounter: Payer: Self-pay | Admitting: Podiatry

## 2016-07-02 DIAGNOSIS — E119 Type 2 diabetes mellitus without complications: Secondary | ICD-10-CM | POA: Diagnosis not present

## 2016-07-02 NOTE — Patient Instructions (Signed)
Day your diabetic foot examination demonstrated adequate circulation and feeling your feet Return as needed for any specific problem or yearly  Diabetes and Foot Care Diabetes may cause you to have problems because of poor blood supply (circulation) to your feet and legs. This may cause the skin on your feet to become thinner, break easier, and heal more slowly. Your skin may become dry, and the skin may peel and crack. You may also have nerve damage in your legs and feet causing decreased feeling in them. You may not notice minor injuries to your feet that could lead to infections or more serious problems. Taking care of your feet is one of the most important things you can do for yourself.  HOME CARE INSTRUCTIONS  Wear shoes at all times, even in the house. Do not go barefoot. Bare feet are easily injured.  Check your feet daily for blisters, cuts, and redness. If you cannot see the bottom of your feet, use a mirror or ask someone for help.  Wash your feet with warm water (do not use hot water) and mild soap. Then pat your feet and the areas between your toes until they are completely dry. Do not soak your feet as this can dry your skin.  Apply a moisturizing lotion or petroleum jelly (that does not contain alcohol and is unscented) to the skin on your feet and to dry, brittle toenails. Do not apply lotion between your toes.  Trim your toenails straight across. Do not dig under them or around the cuticle. File the edges of your nails with an emery board or nail file.  Do not cut corns or calluses or try to remove them with medicine.  Wear clean socks or stockings every day. Make sure they are not too tight. Do not wear knee-high stockings since they may decrease blood flow to your legs.  Wear shoes that fit properly and have enough cushioning. To break in new shoes, wear them for just a few hours a day. This prevents you from injuring your feet. Always look in your shoes before you put them  on to be sure there are no objects inside.  Do not cross your legs. This may decrease the blood flow to your feet.  If you find a minor scrape, cut, or break in the skin on your feet, keep it and the skin around it clean and dry. These areas may be cleansed with mild soap and water. Do not cleanse the area with peroxide, alcohol, or iodine.  When you remove an adhesive bandage, be sure not to damage the skin around it.  If you have a wound, look at it several times a day to make sure it is healing.  Do not use heating pads or hot water bottles. They may burn your skin. If you have lost feeling in your feet or legs, you may not know it is happening until it is too late.  Make sure your health care provider performs a complete foot exam at least annually or more often if you have foot problems. Report any cuts, sores, or bruises to your health care provider immediately. SEEK MEDICAL CARE IF:   You have an injury that is not healing.  You have cuts or breaks in the skin.  You have an ingrown nail.  You notice redness on your legs or feet.  You feel burning or tingling in your legs or feet.  You have pain or cramps in your legs and feet.  Your  legs or feet are numb.  Your feet always feel cold. SEEK IMMEDIATE MEDICAL CARE IF:   There is increasing redness, swelling, or pain in or around a wound.  There is a red line that goes up your leg.  Pus is coming from a wound.  You develop a fever or as directed by your health care provider.  You notice a bad smell coming from an ulcer or wound.   This information is not intended to replace advice given to you by your health care provider. Make sure you discuss any questions you have with your health care provider.   Document Released: 10/11/2000 Document Revised: 06/16/2013 Document Reviewed: 03/23/2013 Elsevier Interactive Patient Education [employer].

## 2016-07-02 NOTE — Progress Notes (Signed)
Patient ID: Heidi Franklin, female   DOB: Feb 25, 1946, 70 y.o.   MRN: [id number]   Subjective: This patient presents today requesting yearly diabetic foot examination. She denies any history of amputation, claudication or skin ulceration since the visit of 07/04/2015. She describes occasional pain in the left plantar MPJ it has resolved at this time. Also, she mentions occasional episode of pain the fifth left toe which is resolved at this time.   Review of systems: Positive for knee pain  Objective:  Pleasant orientated 3  Vascular: DP and PT pulses 2/4 bilaterally Capillary reflex immediate bilaterally  Neurological: Sensation to 10 g monofilament wire intact 5/5 bilaterally Vibratory sensation reactive bilaterally Ankle reflex equal and reactive bilaterally  Dermatological: Texture and turgor within normal limits bilaterally No skin lesions noted bilaterally  Musculoskeletal: Pes planus bilaterally Mild HAV deformities bilaterally Varus rotated fifth toes bilaterally There is no restriction ankle, subtalar, midtarsal joints bilaterally Manual motor testing: Dorsi flexion, plantar flexion, inversion, eversion 5/5 bilaterally  Assessment: Satisfactory neurovascular status Diabetic without foot complications  Plan: I reviewed the results of diabetic examination with patient today made aware that there are no obvious problems noted today. Because she has had some occasional intermittent foot pain I told her to monitor these areas after his any sudden increase in pain, swelling, redness or persistence of these complaints to present for further evaluation  Reappoint yearly or as needed

## 2016-07-09 ENCOUNTER — Ambulatory Visit: Payer: Medicare Other | Admitting: Family Medicine

## 2016-07-11 ENCOUNTER — Encounter: Payer: Self-pay | Admitting: Internal Medicine

## 2016-07-11 ENCOUNTER — Ambulatory Visit (AMBULATORY_SURGERY_CENTER): Payer: Medicare Other | Admitting: Internal Medicine

## 2016-07-11 VITALS — BP 98/63 | HR 87 | Temp 98.6°F | Resp 19 | Ht 60.0 in | Wt 166.0 lb

## 2016-07-11 DIAGNOSIS — K219 Gastro-esophageal reflux disease without esophagitis: Secondary | ICD-10-CM | POA: Diagnosis not present

## 2016-07-11 DIAGNOSIS — F419 Anxiety disorder, unspecified: Secondary | ICD-10-CM | POA: Diagnosis not present

## 2016-07-11 DIAGNOSIS — E119 Type 2 diabetes mellitus without complications: Secondary | ICD-10-CM | POA: Diagnosis not present

## 2016-07-11 DIAGNOSIS — I10 Essential (primary) hypertension: Secondary | ICD-10-CM | POA: Diagnosis not present

## 2016-07-11 MED ORDER — SODIUM CHLORIDE 0.9 % IV SOLN: 500.0000 mL | INTRAVENOUS | Status: DC

## 2016-07-11 NOTE — Progress Notes (Signed)
Report to PACU, RN, vss, BBS= Clear.  

## 2016-07-11 NOTE — Op Note (Signed)
[facility] Patient Name: Heidi Franklin Procedure Date: 07/11/2016 10:42 AM MRN: [id number] Endoscopist: Docia Chuck. Henrene Pastor , MD Age: 70 Referring MD:  Date of Birth: Sep 17, 1946 Gender: Female Account #: [id number] Procedure:                Upper GI endoscopy Indications:              Suspected esophageal reflux, Unexplained chest pain Medicines:                Monitored Anesthesia Care Procedure:                Pre-Anesthesia Assessment:                           - Prior to the procedure, a History and Physical                            was performed, and patient medications and                            allergies were reviewed. The patient's tolerance of                            previous anesthesia was also reviewed. The risks                            and benefits of the procedure and the sedation                            options and risks were discussed with the patient.                            All questions were answered, and informed consent                            was obtained. Prior Anticoagulants: The patient has                            taken no previous anticoagulant or antiplatelet                            agents. ASA Grade Assessment: II - A patient with                            mild systemic disease. After reviewing the risks                            and benefits, the patient was deemed in                            satisfactory condition to undergo the procedure.                           After obtaining informed consent, the endoscope was  passed under direct vision. Throughout the                            procedure, the patient's blood pressure, pulse, and                            oxygen saturations were monitored continuously. The                            Model GIF-HQ190 [id number]) scope was introduced                            through the mouth, and advanced to the second part                            of  duodenum. The upper GI endoscopy was                            accomplished without difficulty. The patient                            tolerated the procedure well. Scope In: Scope Out: Findings:                 The esophagus was normal.                           The stomach was normal.                           The examined duodenum was normal.                           The cardia and gastric fundus were normal on                            retroflexion. Complications:            No immediate complications. Estimated Blood Loss:     Estimated blood loss: none. Impression:               - Normal EGD.                           - GERD. Uncomplicated.                           - Gas bloat.                           - Atypical chest pain non-GI origin. Recommendation:           - Reflux precautions.                           - return to the care of your primary provider.                           - Continue present  medications for GERD. Docia Chuck. Henrene Pastor, MD 07/11/2016 10:55:59 AM This report has been signed electronically.

## 2016-07-11 NOTE — Patient Instructions (Signed)
YOU HAD AN ENDOSCOPIC PROCEDURE TODAY AT [facility]:   Refer to the procedure report that was given to you for any specific questions about what was found during the examination.  If the procedure report does not answer your questions, please call your gastroenterologist to clarify.  If you requested that your care partner not be given the details of your procedure findings, then the procedure report has been included in a sealed envelope for you to review at your convenience later.  YOU SHOULD EXPECT: Some feelings of bloating in the abdomen. Passage of more gas than usual.  Walking can help get rid of the air that was put into your GI tract during the procedure and reduce the bloating. If you had a lower endoscopy (such as a colonoscopy or flexible sigmoidoscopy) you may notice spotting of blood in your stool or on the toilet paper. If you underwent a bowel prep for your procedure, you may not have a normal bowel movement for a few days.  Please Note:  You might notice some irritation and congestion in your nose or some drainage.  This is from the oxygen used during your procedure.  There is no need for concern and it should clear up in a day or so.  SYMPTOMS TO REPORT IMMEDIATELY:     Following upper endoscopy (EGD)  Vomiting of blood or coffee ground material  New chest pain or pain under the shoulder blades  Painful or persistently difficult swallowing  New shortness of breath  Fever of 100F or higher  Black, tarry-looking stools  For urgent or emergent issues, a gastroenterologist can be reached at any hour by calling [phone].   DIET:  We do recommend a small meal at first, but then you may proceed to your regular diet.  Drink plenty of fluids but you should avoid alcoholic beverages for 24 hours.  ACTIVITY:  You should plan to take it easy for the rest of today and you should NOT DRIVE or use heavy machinery until tomorrow (because of the sedation medicines  used during the test).    FOLLOW UP: Our staff will call the number listed on your records the next business day following your procedure to check on you and address any questions or concerns that you may have regarding the information given to you following your procedure. If we do not reach you, we will leave a message.  However, if you are feeling well and you are not experiencing any problems, there is no need to return our call.  We will assume that you have returned to your regular daily activities without incident.  If any biopsies were taken you will be contacted by phone or by letter within the next 1-3 weeks.  Please call us at [phone] if you have not heard about the biopsies in 3 weeks.    SIGNATURES/CONFIDENTIALITY: You and/or your care partner have signed paperwork which will be entered into your electronic medical record.  These signatures attest to the fact that that the information above on your After Visit Summary has been reviewed and is understood.  Full responsibility of the confidentiality of this discharge information lies with you and/or your care-partner.  Information on GERD (REFLUX PRECAUTIONS) given to you today

## 2016-07-12 ENCOUNTER — Telehealth: Payer: Self-pay | Admitting: *Deleted

## 2016-07-12 ENCOUNTER — Other Ambulatory Visit: Payer: Self-pay | Admitting: Internal Medicine

## 2016-07-12 NOTE — Telephone Encounter (Signed)
No answer, left message to call if questions or concerns., 

## 2016-07-25 ENCOUNTER — Emergency Department (INDEPENDENT_AMBULATORY_CARE_PROVIDER_SITE_OTHER)
Admission: EM | Admit: 2016-07-25 | Discharge: 2016-07-25 | Disposition: A | Discharge status: Home / Self Care | Payer: Medicare Other | Source: Home / Self Care | Attending: Emergency Medicine | Admitting: Emergency Medicine

## 2016-07-25 ENCOUNTER — Encounter: Payer: Self-pay | Admitting: Emergency Medicine

## 2016-07-25 DIAGNOSIS — M62838 Other muscle spasm: Secondary | ICD-10-CM

## 2016-07-25 DIAGNOSIS — S161XXA Strain of muscle, fascia and tendon at neck level, initial encounter: Secondary | ICD-10-CM

## 2016-07-25 DIAGNOSIS — M6248 Contracture of muscle, other site: Secondary | ICD-10-CM

## 2016-07-25 MED ORDER — CYCLOBENZAPRINE HCL 5 MG PO TABS: ORAL_TABLET | ORAL | 0 refills | Status: DC

## 2016-07-25 MED ORDER — NAPROXEN 250 MG PO TABS: 250.0000 mg | ORAL_TABLET | Freq: Three times a day (TID) | ORAL | 0 refills | Status: DC

## 2016-07-25 NOTE — ED Triage Notes (Signed)
Right neck pain, ear pain x 4 days, denies trauma, has been using heat, ibuprofen and tylenol with no relief.

## 2016-07-25 NOTE — ED Provider Notes (Signed)
[facility]    CSN: [id number] Arrival date & time: 07/25/16  1449     History   Chief Complaint Chief Complaint  Patient presents with  . Neck Pain    HPI Norrene Whalley is a 70 y.o. female.   HPI Right neck pain, ear pain x 4 days, denies trauma, has been using heat, ibuprofen and tylenol with no relief. The neck pain is moderate, tight, radiates to right ear. Right neck pain exacerbated when she turns her head or looks up or down moving her head. No headache. No focal neurologic symptoms. No vision problem .no other ENT symptoms. No sore throat or rhinorrhea or fever or chills or nausea or vomiting. Past Medical History:  Diagnosis Date  . ALLERGIC RHINITIS   . Allergy   . Anxiety state, unspecified   . Arthritis   . BRONCHITIS, CHRONIC   . DEPRESSION   . DIABETES MELLITUS, TYPE II   . GERD   . HYPERLIPIDEMIA   . HYPERTENSION   . Psoriasis     Patient Active Problem List   Diagnosis Date Noted  . Neck pain 07/29/2016  . Degenerative arthritis of right knee 06/20/2016  . Foot pain, left 05/28/2016  . Degenerative arthritis of left knee 04/10/2016  . Acute bronchitis 12/15/2015  . Peroneal tendinitis of left lower extremity 09/05/2015  . Recurrent falls 08/08/2015  . Bursitis of left shoulder 07/13/2015  . Morbid obesity ([place]) 04/16/2015  . Nonspecific abnormal electrocardiogram (ECG) (EKG) 05/11/2011  . Tenosynovitis of finger 04/22/2011  . Type 2 diabetes, uncontrolled, with neuropathy ([place]) 03/02/2010  . Hyperlipidemia associated with type 2 diabetes mellitus ([place]) 03/02/2010  . Depression with anxiety 03/02/2010  . Essential hypertension 03/02/2010  . Chronic rhinitis 03/02/2010  . Upper airway cough syndrome 03/02/2010  . GERD 03/02/2010    Past Surgical History:  Procedure Laterality Date  . ABDOMINAL HYSTERECTOMY  1980  . TRIGGER FINGER RELEASE     Index finger    OB History    No data available       Home Medications     Prior to Admission medications   Medication Sig Start Date End Date Taking? Authorizing Provider  acetaminophen (TYLENOL) 325 MG tablet Take 650 mg by mouth every 6 (six) hours as needed.   Yes Historical Provider, MD  ibuprofen (ADVIL,MOTRIN) 200 MG tablet Take 200 mg by mouth every 6 (six) hours as needed.   Yes Historical Provider, MD  albuterol (PROVENTIL HFA;VENTOLIN HFA) 108 (90 BASE) MCG/ACT inhaler Inhale 2 puffs into the lungs every 4 (four) hours as needed for wheezing or shortness of breath.    Historical Provider, MD  ALPRAZolam Duanne Moron) 0.5 MG tablet Take 1 tablet by mouth every 8 hours if needed for anxiety. 02/22/16   Binnie Rail, MD  amLODipine (NORVASC) 10 MG tablet TAKE 1 TABLET EVERY DAY 04/16/16   Binnie Rail, MD  aspirin 81 MG tablet Take 81 mg by mouth every morning.     Historical Provider, MD  Calcium Citrate-Vitamin D (CALCIUM + D PO) Take 1 tablet by mouth every morning.    Historical Provider, MD  chlorpheniramine (CHLOR-TRIMETON) 4 MG tablet Take 2 tabs by mouth at bedtime and every 4 hours as needed for drainage/drippy nose/sneezing    Historical Provider, MD  cyclobenzaprine (FLEXERIL) 5 MG tablet Take 1 every 12 hours as needed for muscle relaxant. Caution: May cause drowsiness 07/25/16   Jacqulyn Cane, MD  famotidine (PEPCID) 20 MG tablet Take  1 tablet (20 mg total) by mouth at bedtime. 06/15/15   Rowe Clack, MD  fluocinonide ointment (LIDEX) 0.05 %  06/11/16   Historical Provider, MD  fluticasone (FLONASE) 50 MCG/ACT nasal spray Place 1 spray into both nostrils 2 (two) times daily. 01/19/16   Binnie Rail, MD  glucose blood (ONE TOUCH ULTRA TEST) test strip 1 each by Other route daily. Use 1 strips to check blood sugar twice a day Ex E11.9 01/01/16   Binnie Rail, MD  glyBURIDE (DIABETA) 2.5 MG tablet Take 1 tablet (2.5 mg total) by mouth daily with breakfast. 02/22/16   Binnie Rail, MD  Lancets (ONETOUCH ULTRASOFT) lancets PRN 01/01/16   Binnie Rail, MD   lovastatin (MEVACOR) 20 MG tablet take 1 tablet by mouth at bedtime 04/08/16   Binnie Rail, MD  mometasone (ELOCON) 0.1 % ointment  05/16/16   Historical Provider, MD  Multiple Vitamins-Minerals (CENTRUM SILVER PO) Take 1 tablet by mouth every morning.    Historical Provider, MD  naproxen (NAPROSYN) 250 MG tablet Take 1 tablet (250 mg total) by mouth 3 (three) times daily with meals. As needed for pain. 07/25/16   Jacqulyn Cane, MD  neomycin-polymyxin-hydrocortisone (CORTISPORIN) 3.5-10000-1 otic suspension Place 3 drops into the right ear 3 (three) times daily. 07/29/16   Hoyt Koch, MD  pantoprazole (PROTONIX) 40 MG tablet Take 1 tablet (40 mg total) by mouth 2 (two) times daily before a meal. 05/07/16   Binnie Rail, MD  potassium chloride SA (K-DUR,KLOR-CON) 20 MEQ tablet TAKE 1 TABLET (20 MEQ TOTAL) BY MOUTH DAILY. 03/01/16   Binnie Rail, MD  Respiratory Therapy Supplies (FLUTTER) DEVI Use as directed 08/03/15   Tanda Rockers, MD  sertraline (ZOLOFT) 100 MG tablet TAKE 1 TABLET EVERY MORNING 02/15/16   Binnie Rail, MD  traMADol (ULTRAM) 50 MG tablet Take 1 tablet (50 mg total) by mouth every 8 (eight) hours as needed. 07/29/16   Hoyt Koch, MD  valsartan-hydrochlorothiazide (DIOVAN-HCT) 160-25 MG tablet TAKE 1 TABLET EVERY DAY 12/05/15   Tanda Rockers, MD    Family History Family History  Problem Relation Age of Onset  . Stroke Mother   . Diabetes Father   . Hyperlipidemia Other     Parent  . Hypertension Other     Parent  . Diabetes Sister     x1  . Diabetes Brother     x 2  . Colon cancer Neg Hx   . Esophageal cancer Neg Hx   . Rectal cancer Neg Hx   . Stomach cancer Neg Hx   . Pancreatic cancer Neg Hx     Social History Social History  Substance Use Topics  . Smoking status: Never Smoker  . Smokeless tobacco: Never Used  . Alcohol use No     Allergies   Metformin and related and Penicillins   Review of Systems Review of Systems  Respiratory:  Negative for cough, chest tightness and shortness of breath.   Cardiovascular: Negative for chest pain.  Gastrointestinal: Negative for abdominal pain, nausea and vomiting.  All other systems reviewed and are negative.    Physical Exam Triage Vital Signs ED Triage Vitals  Enc Vitals Group     BP 07/25/16 1511 107/57     Pulse Rate 07/25/16 1511 97     Resp --      Temp 07/25/16 1511 98.6 F (37 C)     Temp Source 07/25/16 1511 Oral  SpO2 07/25/16 1511 98 %     Weight 07/25/16 1511 165 lb (74.8 kg)     Height 07/25/16 1511 5' (1.524 m)     Head Circumference --      Peak Flow --      Pain Score 07/25/16 1513 8     Pain Loc --      Pain Edu? --      Excl. in [place]? --    No data found.   Updated Vital Signs BP 107/57 (BP Location: Left Arm)   Pulse 97   Temp 98.6 F (37 C) (Oral)   Ht 5' (1.524 m)   Wt 165 lb (74.8 kg)   SpO2 98%   BMI 32.22 kg/m   Visual Acuity Right Eye Distance:   Left Eye Distance:   Bilateral Distance:    Right Eye Near:   Left Eye Near:    Bilateral Near:     Physical Exam  Constitutional: She is oriented to person, place, and time. She appears well-developed and well-nourished. No distress.  HENT:  Head: Normocephalic and atraumatic.  Right Ear: External ear normal.  Left Ear: External ear normal.  Nose: Nose normal.  Mouth/Throat: Oropharynx is clear and moist.  Eyes: Conjunctivae are normal. Pupils are equal, round, and reactive to light. No scleral icterus.  Neck: Normal range of motion. Neck supple. No JVD present. No tracheal deviation present. No thyromegaly present.  No C-spine tenderness or deformity. There is tenderness and spasm right posterior cervical muscles, exacerbated by lateral bending, torsion, flexion, and extension of the neck.  Cardiovascular: Normal rate and regular rhythm.   Pulmonary/Chest: Effort normal. No stridor.  Abdominal: She exhibits no distension.  Musculoskeletal:       Cervical back: She exhibits  decreased range of motion, tenderness and spasm. She exhibits no bony tenderness.  Lymphadenopathy:    She has no cervical adenopathy.  Neurological: She is alert and oriented to person, place, and time. She has normal strength. No cranial nerve deficit or sensory deficit.  Skin: Skin is warm and dry. No rash noted.  Psychiatric: She has a normal mood and affect. Her behavior is normal.     UC Treatments / Results  Labs (all labs ordered are listed, but only abnormal results are displayed) Labs Reviewed - No data to display  EKG  EKG Interpretation None       Radiology No results found.  Procedures Procedures (including critical care time)  Medications Ordered in UC Medications - No data to display   Initial Impression / Assessment and Plan / UC Course  I have reviewed the triage vital signs and the nursing notes.  Pertinent labs & imaging results that were available during my care of the patient were reviewed by me and considered in my medical decision making (see chart for details).  Clinical Course     Final Clinical Impressions(s) / UC Diagnoses   Final diagnoses:  Muscle spasms of neck  Neck strain, initial encounter   Treatment options discussed, as well as risks, benefits, alternatives. Patient voiced understanding and agreement with the following plans:  New Prescriptions Discharge Medication List as of 07/25/2016  3:39 PM    START taking these medications   Details  cyclobenzaprine (FLEXERIL) 5 MG tablet Take 1 every 12 hours as needed for muscle relaxant. Caution: May cause drowsiness, Normal    naproxen (NAPROSYN) 250 MG tablet Take 1 tablet (250 mg total) by mouth 3 (three) times daily with meals. As  needed for pain., Starting Thu 07/25/2016, Normal       An After Visit Summary was printed and given to the patient/family. Precautions discussed. Red flags discussed. Questions invited and answered. They voiced understanding and agreement.      Jacqulyn Cane, MD 08/01/16 [phone]

## 2016-07-26 ENCOUNTER — Ambulatory Visit: Payer: Medicare Other | Admitting: Nurse Practitioner

## 2016-07-29 ENCOUNTER — Encounter: Payer: Self-pay | Admitting: Internal Medicine

## 2016-07-29 ENCOUNTER — Ambulatory Visit (INDEPENDENT_AMBULATORY_CARE_PROVIDER_SITE_OTHER): Payer: Medicare Other | Admitting: Internal Medicine

## 2016-07-29 DIAGNOSIS — M542 Cervicalgia: Secondary | ICD-10-CM | POA: Insufficient documentation

## 2016-07-29 MED ORDER — NEOMYCIN-POLYMYXIN-HC 3.5-10000-1 OT SUSP: 3.0000 [drp] | Freq: Three times a day (TID) | OTIC | 0 refills | Status: DC

## 2016-07-29 MED ORDER — TRAMADOL HCL 50 MG PO TABS: 50.0000 mg | ORAL_TABLET | Freq: Three times a day (TID) | ORAL | 0 refills | Status: DC | PRN

## 2016-07-29 NOTE — Assessment & Plan Note (Signed)
Treating right otitis externa to see if this relieves her symptoms. Rx for corticosporin ear drops and tramadol for pain in the neck. She has failed nsaids and muscle relaxers.

## 2016-07-29 NOTE — Patient Instructions (Addendum)
We have sent in ear drops (corticosporin) to use on the right ear. Use 3 drops, 3 times a day, for 3 days to help.   We have given you a prescription for tramadol which might help with the neck pain. Keep using the heat on it as well.

## 2016-07-29 NOTE — Progress Notes (Signed)
   Subjective:    Patient ID: Heidi Franklin, female    DOB: 16-May-1946, 70 y.o.   MRN: [id number]  HPI The patient is a 70 YO female coming in for right ear and neck pain for about 1 week. She has not tried anything over the counter for it. She went to urgent care about 3 days ago and they gave her naproxen and flexeril which did not help for the pain. Denies injury previous to the start of symptoms. She is not able to move her head right as well without pain. No limitation to up and down. No fevers or chills, no rash. Does not radiate into her arm, just up into her ear (right)  Review of Systems  Constitutional: Negative.   HENT: Positive for ear pain. Negative for ear discharge and hearing loss.   Respiratory: Negative.   Cardiovascular: Negative.   Musculoskeletal: Positive for myalgias and neck pain. Negative for arthralgias and neck stiffness.  Skin: Negative.   Neurological: Negative.       Objective:   Physical Exam  Constitutional: She is oriented to person, place, and time. She appears well-developed and well-nourished.  HENT:  Head: Normocephalic and atraumatic.  Left Ear: External ear normal.  Right ear with otitis externa.   Eyes: EOM are normal.  Neck: Normal range of motion. No JVD present. No thyromegaly present.  Mild limitation with movement to the right due to pain. Pain in the posterior cervical spine and none in the shoulder or trapezius muscle.   Cardiovascular: Normal rate and regular rhythm.   Pulmonary/Chest: Effort normal and breath sounds normal.  Abdominal: Soft.  Lymphadenopathy:    She has no cervical adenopathy.  Neurological: She is alert and oriented to person, place, and time.  Skin: Skin is warm and dry.   Vitals:   07/29/16 1354  BP: 112/62  Pulse: 91  Resp: 18  Temp: 98.5 F (36.9 C)  TempSrc: Oral  SpO2: 96%  Weight: 166 lb (75.3 kg)  Height: 5' (1.524 m)      Assessment & Plan:

## 2016-07-29 NOTE — Progress Notes (Signed)
Pre visit review using our clinic review tool, if applicable. No additional management support is needed unless otherwise documented below in the visit note. 

## 2016-08-07 ENCOUNTER — Ambulatory Visit: Payer: Medicare Other

## 2016-08-07 VITALS — BP 112/60 | HR 95 | Ht 60.0 in | Wt 167.2 lb

## 2016-08-07 DIAGNOSIS — Z0000 Encounter for general adult medical examination without abnormal findings: Secondary | ICD-10-CM
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
## 2016-08-07 NOTE — Patient Instructions (Addendum)
Heidi Franklin , Thank you for taking time to come for your Medicare Wellness Visit. I appreciate your ongoing commitment to your health goals. Please review the following plan we discussed and let me know if I can assist you in the future.  Will find time to rest;   Heidi Franklin will confirm eye exam this year    These are the goals we discussed: Goals    . patient          Will reduce stress; take time off from more stress and seek opportunities to rest.       This is a list of the screening recommended for you and due dates:  Health Maintenance  Topic Date Due  . Shingles Vaccine  03/12/2005  . Eye exam for diabetics  11/29/2015  . Hemoglobin A1C  08/23/2016  . Complete foot exam   07/02/2017  . Mammogram  11/13/2017  . Tetanus Vaccine  11/24/2023  . Colon Cancer Screening  01/12/2024  . Flu Shot  Completed  . DEXA scan (bone density measurement)  Completed  .  Hepatitis C: One time screening is recommended by Center for Disease Control  (CDC) for  adults born from [birth year] through 1966.   Completed  . Pneumonia vaccines  Completed   Stress and Stress Management Stress is a normal reaction to life events. It is what you feel when life demands more than you are used to or more than you can handle. Some stress can be useful. For example, the stress reaction can help you catch the last bus of the day, study for a test, or meet a deadline at work. But stress that occurs too often or for too long can cause problems. It can affect your emotional health and interfere with relationships and normal daily activities. Too much stress can weaken your immune system and increase your risk for physical illness. If you already have a medical problem, stress can make it worse. CAUSES  All sorts of life events may cause stress. An event that causes stress for one person may not be stressful for another person. Major life events commonly cause stress. These may be positive or negative. Examples include losing your  job, moving into a new home, getting married, having a baby, or losing a loved one. Less obvious life events may also cause stress, especially if they occur day after day or in combination. Examples include working long hours, driving in traffic, caring for children, being in debt, or being in a difficult relationship. SIGNS AND SYMPTOMS Stress may cause emotional symptoms including, the following:  Anxiety. This is feeling worried, afraid, on edge, overwhelmed, or out of control.  Anger. This is feeling irritated or impatient.  Depression. This is feeling sad, down, helpless, or guilty.  Difficulty focusing, remembering, or making decisions. Stress may cause physical symptoms, including the following:   Aches and pains. These may affect your head, neck, back, stomach, or other areas of your body.  Tight muscles or clenched jaw.  Low energy or trouble sleeping. Stress may cause unhealthy behaviors, including the following:   Eating to feel better (overeating) or skipping meals.  Sleeping too little, too much, or both.  Working too much or putting off tasks (procrastination).  Smoking, drinking alcohol, or using drugs to feel better. DIAGNOSIS  Stress is diagnosed through an assessment by your health care provider. Your health care provider will ask questions about your symptoms and any stressful life events.Your health care provider will also ask about  your medical history and may order blood tests or other tests. Certain medical conditions and medicine can cause physical symptoms similar to stress. Mental illness can cause emotional symptoms and unhealthy behaviors similar to stress. Your health care provider may refer you to a mental health professional for further evaluation.  TREATMENT  Stress management is the recommended treatment for stress.The goals of stress management are reducing stressful life events and coping with stress in healthy ways.  Techniques for reducing  stressful life events include the following:  Stress identification. Self-monitor for stress and identify what causes stress for you. These skills may help you to avoid some stressful events.  Time management. Set your priorities, keep a calendar of events, and learn to say "no." These tools can help you avoid making too many commitments. Techniques for coping with stress include the following:  Rethinking the problem. Try to think realistically about stressful events rather than ignoring them or overreacting. Try to find the positives in a stressful situation rather than focusing on the negatives.  Exercise. Physical exercise can release both physical and emotional tension. The key is to find a form of exercise you enjoy and do it regularly.  Relaxation techniques. These relax the body and mind. Examples include yoga, meditation, tai chi, biofeedback, deep breathing, progressive muscle relaxation, listening to music, being out in nature, journaling, and other hobbies. Again, the key is to find one or more that you enjoy and can do regularly.  Healthy lifestyle. Eat a balanced diet, get plenty of sleep, and do not smoke. Avoid using alcohol or drugs to relax.  Strong support network. Spend time with family, friends, or other people you enjoy being around.Express your feelings and talk things over with someone you trust. Counseling or talktherapy with a mental health professional may be helpful if you are having difficulty managing stress on your own. Medicine is typically not recommended for the treatment of stress.Talk to your health care provider if you think you need medicine for symptoms of stress. HOME CARE INSTRUCTIONS  Keep all follow-up visits as directed by your health care provider.  Take all medicines as directed by your health care provider. SEEK MEDICAL CARE IF:  Your symptoms get worse or you start having new symptoms.  You feel overwhelmed by your problems and can no  longer manage them on your own. SEEK IMMEDIATE MEDICAL CARE IF:  You feel like hurting yourself or someone else.   This information is not intended to replace advice given to you by your health care provider. Make sure you discuss any questions you have with your health care provider.   Document Released: 04/09/2001 Document Revised: 11/04/2014 Document Reviewed: 06/08/2013 Elsevier Interactive Patient Education 2016 [employer].  Health Maintenance, Female Adopting a healthy lifestyle and getting preventive care can go a long way to promote health and wellness. Talk with your health care provider about what schedule of regular examinations is right for you. This is a good chance for you to check in with your provider about disease prevention and staying healthy. In between checkups, there are plenty of things you can do on your own. Experts have done a lot of research about which lifestyle changes and preventive measures are most likely to keep you healthy. Ask your health care provider for more information. WEIGHT AND DIET  Eat a healthy diet  Be sure to include plenty of vegetables, fruits, low-fat dairy products, and lean protein.  Do not eat a lot of foods high in solid  fats, added sugars, or salt.  Get regular exercise. This is one of the most important things you can do for your health.  Most adults should exercise for at least 150 minutes each week. The exercise should increase your heart rate and make you sweat (moderate-intensity exercise).  Most adults should also do strengthening exercises at least twice a week. This is in addition to the moderate-intensity exercise.  Maintain a healthy weight  Body mass index (BMI) is a measurement that can be used to identify possible weight problems. It estimates body fat based on height and weight. Your health care provider can help determine your BMI and help you achieve or maintain a healthy weight.  For females 54 years of age and  older:   A BMI below 18.5 is considered underweight.  A BMI of 18.5 to 24.9 is normal.  A BMI of 25 to 29.9 is considered overweight.  A BMI of 30 and above is considered obese.  Watch levels of cholesterol and blood lipids  You should start having your blood tested for lipids and cholesterol at 71 years of age, then have this test every 5 years.  You may need to have your cholesterol levels checked more often if:  Your lipid or cholesterol levels are high.  You are older than 71 years of age.  You are at high risk for heart disease.  CANCER SCREENING   Lung Cancer  Lung cancer screening is recommended for adults 36-65 years old who are at high risk for lung cancer because of a history of smoking.  A yearly low-dose CT scan of the lungs is recommended for people who:  Currently smoke.  Have quit within the past 15 years.  Have at least a 30-pack-year history of smoking. A pack year is smoking an average of one pack of cigarettes a day for 1 year.  Yearly screening should continue until it has been 15 years since you quit.  Yearly screening should stop if you develop a health problem that would prevent you from having lung cancer treatment.  Breast Cancer  Practice breast self-awareness. This means understanding how your breasts normally appear and feel.  It also means doing regular breast self-exams. Let your health care provider know about any changes, no matter how small.  If you are in your 20s or 30s, you should have a clinical breast exam (CBE) by a health care provider every 1-3 years as part of a regular health exam.  If you are 13 or older, have a CBE every year. Also consider having a breast X-ray (mammogram) every year.  If you have a family history of breast cancer, talk to your health care provider about genetic screening.  If you are at high risk for breast cancer, talk to your health care provider about having an MRI and a mammogram every  year.  Breast cancer gene (BRCA) assessment is recommended for women who have family members with BRCA-related cancers. BRCA-related cancers include:  Breast.  Ovarian.  Tubal.  Peritoneal cancers.  Results of the assessment will determine the need for genetic counseling and BRCA1 and BRCA2 testing. Cervical Cancer Your health care provider may recommend that you be screened regularly for cancer of the pelvic organs (ovaries, uterus, and vagina). This screening involves a pelvic examination, including checking for microscopic changes to the surface of your cervix (Pap test). You may be encouraged to have this screening done every 3 years, beginning at age 72.  For women ages 73-65, health  care providers may recommend pelvic exams and Pap testing every 3 years, or they may recommend the Pap and pelvic exam, combined with testing for human papilloma virus (HPV), every 5 years. Some types of HPV increase your risk of cervical cancer. Testing for HPV may also be done on women of any age with unclear Pap test results.  Other health care providers may not recommend any screening for nonpregnant women who are considered low risk for pelvic cancer and who do not have symptoms. Ask your health care provider if a screening pelvic exam is right for you.  If you have had past treatment for cervical cancer or a condition that could lead to cancer, you need Pap tests and screening for cancer for at least 20 years after your treatment. If Pap tests have been discontinued, your risk factors (such as having a new sexual partner) need to be reassessed to determine if screening should resume. Some women have medical problems that increase the chance of getting cervical cancer. In these cases, your health care provider may recommend more frequent screening and Pap tests. Colorectal Cancer  This type of cancer can be detected and often prevented.  Routine colorectal cancer screening usually begins at 71 years of  age and continues through 71 years of age.  Your health care provider may recommend screening at an earlier age if you have risk factors for colon cancer.  Your health care provider may also recommend using home test kits to check for hidden blood in the stool.  A small camera at the end of a tube can be used to examine your colon directly (sigmoidoscopy or colonoscopy). This is done to check for the earliest forms of colorectal cancer.  Routine screening usually begins at age 2.  Direct examination of the colon should be repeated every 5-10 years through 71 years of age. However, you may need to be screened more often if early forms of precancerous polyps or small growths are found. Skin Cancer  Check your skin from head to toe regularly.  Tell your health care provider about any new moles or changes in moles, especially if there is a change in a mole's shape or color.  Also tell your health care provider if you have a mole that is larger than the size of a pencil eraser.  Always use sunscreen. Apply sunscreen liberally and repeatedly throughout the day.  Protect yourself by wearing long sleeves, pants, a wide-brimmed hat, and sunglasses whenever you are outside. HEART DISEASE, DIABETES, AND HIGH BLOOD PRESSURE   High blood pressure causes heart disease and increases the risk of stroke. High blood pressure is more likely to develop in:  People who have blood pressure in the high end of the normal range (130-139/85-89 mm Hg).  People who are overweight or obese.  People who are African American.  If you are 4-53 years of age, have your blood pressure checked every 3-5 years. If you are 70 years of age or older, have your blood pressure checked every year. You should have your blood pressure measured twice--once when you are at a hospital or clinic, and once when you are not at a hospital or clinic. Record the average of the two measurements. To check your blood pressure when you are  not at a hospital or clinic, you can use:  An automated blood pressure machine at a pharmacy.  A home blood pressure monitor.  If you are between 26 years and 2 years old, ask your health  care provider if you should take aspirin to prevent strokes.  Have regular diabetes screenings. This involves taking a blood sample to check your fasting blood sugar level.  If you are at a normal weight and have a low risk for diabetes, have this test once every three years after 71 years of age.  If you are overweight and have a high risk for diabetes, consider being tested at a younger age or more often. PREVENTING INFECTION  Hepatitis B  If you have a higher risk for hepatitis B, you should be screened for this virus. You are considered at high risk for hepatitis B if:  You were born in a country where hepatitis B is common. Ask your health care provider which countries are considered high risk.  Your parents were born in a high-risk country, and you have not been immunized against hepatitis B (hepatitis B vaccine).  You have HIV or AIDS.  You use needles to inject street drugs.  You live with someone who has hepatitis B.  You have had sex with someone who has hepatitis B.  You get hemodialysis treatment.  You take certain medicines for conditions, including cancer, organ transplantation, and autoimmune conditions. Hepatitis C  Blood testing is recommended for:  Everyone born from [birth year] through 1966.  Anyone with known risk factors for hepatitis C. Sexually transmitted infections (STIs)  You should be screened for sexually transmitted infections (STIs) including gonorrhea and chlamydia if:  You are sexually active and are younger than 71 years of age.  You are older than 71 years of age and your health care provider tells you that you are at risk for this type of infection.  Your sexual activity has changed since you were last screened and you are at an increased risk for  chlamydia or gonorrhea. Ask your health care provider if you are at risk.  If you do not have HIV, but are at risk, it may be recommended that you take a prescription medicine daily to prevent HIV infection. This is called pre-exposure prophylaxis (PrEP). You are considered at risk if:  You are sexually active and do not regularly use condoms or know the HIV status of your partner(s).  You take drugs by injection.  You are sexually active with a partner who has HIV. Talk with your health care provider about whether you are at high risk of being infected with HIV. If you choose to begin PrEP, you should first be tested for HIV. You should then be tested every 3 months for as long as you are taking PrEP.  PREGNANCY   If you are premenopausal and you may become pregnant, ask your health care provider about preconception counseling.  If you may become pregnant, take 400 to 800 micrograms (mcg) of folic acid every day.  If you want to prevent pregnancy, talk to your health care provider about birth control (contraception). OSTEOPOROSIS AND MENOPAUSE   Osteoporosis is a disease in which the bones lose minerals and strength with aging. This can result in serious bone fractures. Your risk for osteoporosis can be identified using a bone density scan.  If you are 16 years of age or older, or if you are at risk for osteoporosis and fractures, ask your health care provider if you should be screened.  Ask your health care provider whether you should take a calcium or vitamin D supplement to lower your risk for osteoporosis.  Menopause may have certain physical symptoms and risks.  Hormone replacement therapy  may reduce some of these symptoms and risks. Talk to your health care provider about whether hormone replacement therapy is right for you.  HOME CARE INSTRUCTIONS   Schedule regular health, dental, and eye exams.  Stay current with your immunizations.   Do not use any tobacco products  including cigarettes, chewing tobacco, or electronic cigarettes.  If you are pregnant, do not drink alcohol.  If you are breastfeeding, limit how much and how often you drink alcohol.  Limit alcohol intake to no more than 1 drink per day for nonpregnant women. One drink equals 12 ounces of beer, 5 ounces of wine, or 1 ounces of hard liquor.  Do not use street drugs.  Do not share needles.  Ask your health care provider for help if you need support or information about quitting drugs.  Tell your health care provider if you often feel depressed.  Tell your health care provider if you have ever been abused or do not feel safe at home.   This information is not intended to replace advice given to you by your health care provider. Make sure you discuss any questions you have with your health care provider.   Document Released: 04/29/2011 Document Revised: 11/04/2014 Document Reviewed: 09/15/2013 Elsevier Interactive Patient Education [employer].

## 2016-08-08 ENCOUNTER — Encounter: Payer: Self-pay | Admitting: Internal Medicine

## 2016-08-08 ENCOUNTER — Ambulatory Visit (INDEPENDENT_AMBULATORY_CARE_PROVIDER_SITE_OTHER): Payer: Medicare Other | Admitting: Internal Medicine

## 2016-08-08 ENCOUNTER — Other Ambulatory Visit (INDEPENDENT_AMBULATORY_CARE_PROVIDER_SITE_OTHER): Payer: Medicare Other

## 2016-08-08 VITALS — BP 126/60 | HR 101 | Temp 99.1°F | Resp 16 | Wt 167.0 lb

## 2016-08-08 DIAGNOSIS — F418 Other specified anxiety disorders: Secondary | ICD-10-CM

## 2016-08-08 DIAGNOSIS — E1140 Type 2 diabetes mellitus with diabetic neuropathy, unspecified: Secondary | ICD-10-CM

## 2016-08-08 DIAGNOSIS — I10 Essential (primary) hypertension: Secondary | ICD-10-CM | POA: Diagnosis not present

## 2016-08-08 DIAGNOSIS — IMO0002 Reserved for concepts with insufficient information to code with codable children: Secondary | ICD-10-CM

## 2016-08-08 DIAGNOSIS — E1165 Type 2 diabetes mellitus with hyperglycemia: Secondary | ICD-10-CM

## 2016-08-08 DIAGNOSIS — W19XXXA Unspecified fall, initial encounter: Secondary | ICD-10-CM

## 2016-08-08 DIAGNOSIS — K219 Gastro-esophageal reflux disease without esophagitis: Secondary | ICD-10-CM

## 2016-08-08 LAB — CBC WITH DIFFERENTIAL/PLATELET
Basophils Absolute: 0 10*3/uL (ref 0.0–0.1)
Basophils Relative: 0.4 % (ref 0.0–3.0)
Eosinophils Absolute: 0.3 10*3/uL (ref 0.0–0.7)
Eosinophils Relative: 2.8 % (ref 0.0–5.0)
HCT: 34.8 % — ABNORMAL LOW (ref 36.0–46.0)
Hemoglobin: 11.6 g/dL — ABNORMAL LOW (ref 12.0–15.0)
Lymphocytes Relative: 20 % (ref 12.0–46.0)
Lymphs Abs: 1.9 10*3/uL (ref 0.7–4.0)
MCHC: 33.3 g/dL (ref 30.0–36.0)
MCV: 85.3 fl (ref 78.0–100.0)
Monocytes Absolute: 0.6 10*3/uL (ref 0.1–1.0)
Monocytes Relative: 6.7 % (ref 3.0–12.0)
Neutro Abs: 6.8 10*3/uL (ref 1.4–7.7)
Neutrophils Relative %: 70.1 % (ref 43.0–77.0)
Platelets: 315 10*3/uL (ref 150.0–400.0)
RBC: 4.08 Mil/uL (ref 3.87–5.11)
RDW: 14.3 % (ref 11.5–15.5)
WBC: 9.7 10*3/uL (ref 4.0–10.5)

## 2016-08-08 LAB — COMPREHENSIVE METABOLIC PANEL
ALT: 17 U/L (ref 0–35)
AST: 18 U/L (ref 0–37)
Albumin: 3.6 g/dL (ref 3.5–5.2)
Alkaline Phosphatase: 71 U/L (ref 39–117)
BUN: 16 mg/dL (ref 6–23)
CO2: 30 mEq/L (ref 19–32)
Calcium: 9.6 mg/dL (ref 8.4–10.5)
Chloride: 104 mEq/L (ref 96–112)
Creatinine, Ser: 0.88 mg/dL (ref 0.40–1.20)
GFR: 81.37 mL/min (ref >60.00)
Glucose, Bld: 116 mg/dL — ABNORMAL HIGH (ref 70–99)
Potassium: 3.6 mEq/L (ref 3.5–5.1)
Sodium: 141 mEq/L (ref 135–145)
Total Bilirubin: 0.3 mg/dL (ref 0.2–1.2)
Total Protein: 7.6 g/dL (ref 6.0–8.3)

## 2016-08-08 LAB — TSH: TSH: 3.9 u[IU]/mL (ref 0.35–4.50)

## 2016-08-08 LAB — MICROALBUMIN / CREATININE URINE RATIO
Creatinine,U: 199.4 mg/dL
Microalb Creat Ratio: 0.8 mg/g (ref 0.0–30.0)
Microalb, Ur: 1.6 mg/dL (ref 0.0–1.9)

## 2016-08-08 LAB — LIPID PANEL
Cholesterol: 161 mg/dL (ref 0–200)
HDL: 33.2 mg/dL — ABNORMAL LOW (ref >39.00)
LDL Cholesterol: 93 mg/dL (ref 0–99)
NonHDL: 127.98
Total CHOL/HDL Ratio: 5
Triglycerides: 174 mg/dL — ABNORMAL HIGH (ref 0.0–149.0)
VLDL: 34.8 mg/dL (ref 0.0–40.0)

## 2016-08-08 LAB — HEMOGLOBIN A1C: Hgb A1c MFr Bld: 6.6 % — ABNORMAL HIGH (ref 4.6–6.5)

## 2016-08-08 MED ORDER — VALSARTAN-HYDROCHLOROTHIAZIDE 160-12.5 MG PO TABS: 1.0000 | ORAL_TABLET | Freq: Every day | ORAL | 1 refills | Status: DC

## 2016-08-08 NOTE — Progress Notes (Signed)
Subjective:    Patient ID: Heidi Franklin, female    DOB: 1945-12-30, 70 y.o.   MRN: [id number]  HPI She is here for an acute visit.   Falls:  Three weeks ago she fell in the grocery store.  She was turning to put something in her cart and fell.  She denies feeling lightheaded or dizzy prior to Or immediately after falling. Someone caught her and she did not hit the floor.  She felt a little nervous afterwards, but otherwise felt normal.  She denied any palpitations, sob, cp, headaches or lightheadedness/dizziness.   She has not had any problems since then. She had eaten and drank normally that day.  She fell 4 times last year.  One time she just fell for no reason-similar to the above episode .  Two times she fell out of bed - she was trying to get out of bed quickly and ended up falling out of bed.    She denies feeling dehydrated prior to these episodes.  She denies any of them occurring after not eating for hours.    She feels unsteady  and slightly lightheaded when she first stands up after sitting, which is not new.  She denies any confusion or LOC with any of these episodes.    Diabetes: She is taking her medication daily as prescribed. She is compliant with a diabetic diet. She is not exercising regularly. She monitors her sugars and they have been running130s-140s. She denies episodes of hypoglycemia.   Hypertension: She is taking her medication daily. She is compliant with a low sodium diet.  She denies chest pain, palpitations, edema, shortness of breath and regular headaches. She is  not exercising regularly.    Hyperlipidemia: She is taking her medication daily. She is compliant with a low fat/cholesterol diet. She is not exercising regularly. She denies myalgias.   Depression, anxeity: She is taking her medication daily as prescribed. She denies any side effects from the medication. She feels her depression and anxiety are well controlled and she is happy with her current dose of  medication.     Medications and allergies reviewed with patient and updated if appropriate.  Patient Active Problem List   Diagnosis Date Noted  . Neck pain 07/29/2016  . Degenerative arthritis of right knee 06/20/2016  . Foot pain, left 05/28/2016  . Degenerative arthritis of left knee 04/10/2016  . Acute bronchitis 12/15/2015  . Peroneal tendinitis of left lower extremity 09/05/2015  . Recurrent falls 08/08/2015  . Bursitis of left shoulder 07/13/2015  . Morbid obesity ([place]) 04/16/2015  . Nonspecific abnormal electrocardiogram (ECG) (EKG) 05/11/2011  . Tenosynovitis of finger 04/22/2011  . Type 2 diabetes, uncontrolled, with neuropathy ([place]) 03/02/2010  . Hyperlipidemia associated with type 2 diabetes mellitus ([place]) 03/02/2010  . Depression with anxiety 03/02/2010  . Essential hypertension 03/02/2010  . Chronic rhinitis 03/02/2010  . Upper airway cough syndrome 03/02/2010  . GERD 03/02/2010    Current Outpatient Prescriptions on File Prior to Visit  Medication Sig Dispense Refill  . acetaminophen (TYLENOL) 325 MG tablet Take 650 mg by mouth every 6 (six) hours as needed.    Marland Kitchen albuterol (PROVENTIL HFA;VENTOLIN HFA) 108 (90 BASE) MCG/ACT inhaler Inhale 2 puffs into the lungs every 4 (four) hours as needed for wheezing or shortness of breath.    . ALPRAZolam (XANAX) 0.5 MG tablet Take 1 tablet by mouth every 8 hours if needed for anxiety. 30 tablet 2  . amLODipine (NORVASC) 10 MG  tablet TAKE 1 TABLET EVERY DAY 90 tablet 3  . aspirin 81 MG tablet Take 81 mg by mouth every morning.     . Calcium Citrate-Vitamin D (CALCIUM + D PO) Take 1 tablet by mouth every morning.    . chlorpheniramine (CHLOR-TRIMETON) 4 MG tablet Take 2 tabs by mouth at bedtime and every 4 hours as needed for drainage/drippy nose/sneezing    . cyclobenzaprine (FLEXERIL) 5 MG tablet Take 1 every 12 hours as needed for muscle relaxant. Caution: May cause drowsiness 20 tablet 0  . famotidine (PEPCID) 20 MG tablet  Take 1 tablet (20 mg total) by mouth at bedtime. 30 tablet 11  . fluocinonide ointment (LIDEX) 0.05 %     . fluticasone (FLONASE) 50 MCG/ACT nasal spray Place 1 spray into both nostrils 2 (two) times daily. 48 g 1  . glucose blood (ONE TOUCH ULTRA TEST) test strip 1 each by Other route daily. Use 1 strips to check blood sugar twice a day Ex E11.9 100 each 3  . glyBURIDE (DIABETA) 2.5 MG tablet Take 1 tablet (2.5 mg total) by mouth daily with breakfast. 30 tablet 5  . ibuprofen (ADVIL,MOTRIN) 200 MG tablet Take 200 mg by mouth every 6 (six) hours as needed.    . Lancets (ONETOUCH ULTRASOFT) lancets PRN 100 each 3  . lovastatin (MEVACOR) 20 MG tablet take 1 tablet by mouth at bedtime 90 tablet 3  . mometasone (ELOCON) 0.1 % ointment     . Multiple Vitamins-Minerals (CENTRUM SILVER PO) Take 1 tablet by mouth every morning.    . naproxen (NAPROSYN) 250 MG tablet Take 1 tablet (250 mg total) by mouth 3 (three) times daily with meals. As needed for pain. 30 tablet 0  . pantoprazole (PROTONIX) 40 MG tablet Take 1 tablet (40 mg total) by mouth 2 (two) times daily before a meal. 180 tablet 1  . potassium chloride SA (K-DUR,KLOR-CON) 20 MEQ tablet TAKE 1 TABLET (20 MEQ TOTAL) BY MOUTH DAILY. 90 tablet 3  . Respiratory Therapy Supplies (FLUTTER) DEVI Use as directed 1 each 0  . sertraline (ZOLOFT) 100 MG tablet TAKE 1 TABLET EVERY MORNING 90 tablet 1  . valsartan-hydrochlorothiazide (DIOVAN-HCT) 160-25 MG tablet TAKE 1 TABLET EVERY DAY 90 tablet 3   No current facility-administered medications on file prior to visit.     Past Medical History:  Diagnosis Date  . ALLERGIC RHINITIS   . Allergy   . Anxiety state, unspecified   . Arthritis   . BRONCHITIS, CHRONIC   . DEPRESSION   . DIABETES MELLITUS, TYPE II   . GERD   . HYPERLIPIDEMIA   . HYPERTENSION   . Psoriasis     Past Surgical History:  Procedure Laterality Date  . ABDOMINAL HYSTERECTOMY  1980  . TRIGGER FINGER RELEASE     Index finger     Social History   Social History  . Marital status: Married    Spouse name: N/A  . Number of children: 0  . Years of education: 69yr colge   Occupational History  . Retired     Social History Main Topics  . Smoking status: Never Smoker  . Smokeless tobacco: Never Used  . Alcohol use No  . Drug use: No  . Sexual activity: Not on file   Other Topics Concern  . Not on file   Social History Narrative   Married, lives with spouse-retired from [employer] insurance   Not employed    Drinks coffee occasional, Consumes 1 soda  a day     Family History  Problem Relation Age of Onset  . Stroke Mother   . Diabetes Father   . Hyperlipidemia Other     Parent  . Hypertension Other     Parent  . Diabetes Sister     x1  . Diabetes Brother     x 2  . Colon cancer Neg Hx   . Esophageal cancer Neg Hx   . Rectal cancer Neg Hx   . Stomach cancer Neg Hx   . Pancreatic cancer Neg Hx     Review of Systems  Constitutional: Negative for chills and fever.  Eyes: Negative for visual disturbance.  Respiratory: Positive for cough. Negative for shortness of breath and wheezing.   Cardiovascular: Negative for chest pain, palpitations and leg swelling.  Musculoskeletal: Positive for arthralgias (knee pain, knees feel weak at times) and back pain (occ pain down left leg).  Neurological: Positive for light-headedness (with changing positions). Negative for dizziness and headaches.       Objective:   Vitals:   08/08/16 0828  BP: 126/60  Pulse: (!) 101  Resp: 16  Temp: 99.1 F (37.3 C)   Filed Weights   08/08/16 0828  Weight: 167 lb (75.8 kg)   Body mass index is 32.61 kg/m.   Physical Exam Constitutional: Appears well-developed and well-nourished. No distress.  HENT:  Head: Normocephalic and atraumatic.  Neck: Neck supple. No tracheal deviation present. No thyromegaly present.  No cervical lymphadenopathy Cardiovascular: Normal rate, regular rhythm and normal heart sounds.   No  murmur heard. No carotid bruit .  No edema Pulmonary/Chest: Effort normal and breath sounds normal. No respiratory distress. No has no wheezes. No rales.  Skin: Skin is warm and dry. Not diaphoretic.  Psychiatric: Normal mood and affect. Behavior is normal.         Assessment & Plan:   See Problem List for Assessment and Plan of chronic medical problems.

## 2016-08-08 NOTE — Assessment & Plan Note (Addendum)
Blood pressure is here today, but she is experiencing some unsteadiness and lightheadedness when she first stands. I do not think her recent fall was related to hypotension since she did not experience any lightheadedness, but this we will go ahead and decrease her blood pressure medication Continue amlodipine 5 mg daily Decrease Diovan-hydrochlorothiazide 160-25 mg to 150-12.5 mg daily Advised her to monitor blood pressure at home

## 2016-08-08 NOTE — Assessment & Plan Note (Signed)
GERD controlled Continue daily medication  

## 2016-08-08 NOTE — Patient Instructions (Addendum)
  Test(s) ordered today. Your results will be released to [place] (or called to you) after review, usually within 72hours after test completion. If any changes need to be made, you will be notified at that same time.  All other Health Maintenance issues reviewed.   All recommended immunizations and age-appropriate screenings are up-to-date or discussed.  No immunizations administered today.   Medications reviewed and updated.  Changes include decreasing your diovan-hct 160-25 mg dose to 160-12.5 mg daily  Your prescription(s) have been submitted to your pharmacy. Please take as directed and contact our office if you believe you are having problem(s) with the medication(s).  A referral was ordered for cardiology.   Please followup in 6 months

## 2016-08-08 NOTE — Assessment & Plan Note (Signed)
Sugars well controlled at home Check A1c Continue current medication

## 2016-08-08 NOTE — Assessment & Plan Note (Signed)
Controlled, stable Continue current dose of medication  

## 2016-08-08 NOTE — Progress Notes (Signed)
Pre visit review using our clinic review tool, if applicable. No additional management support is needed unless otherwise documented below in the visit note. 

## 2016-08-08 NOTE — Assessment & Plan Note (Signed)
She is to follow-up in the past year-2 sound somewhat mechanical, but two occurred for no apparent reason EKG today within normal limits Check basic blood work Given the lack of prodromal symptoms I doubt that these falls were related to hypoglycemia or hypotension No symptoms to suggest cardiac arrhythmia, but we will refer to cardiology for further evaluation  Monitor for now-advised her to return if she has another episode

## 2016-08-27 ENCOUNTER — Ambulatory Visit: Payer: Medicare Other | Admitting: Internal Medicine

## 2016-09-02 NOTE — Progress Notes (Signed)
HPI: 70 year old female for evaluation of question syncope. Nuclear study August 2012 normal. Over the past 1 year the patient has had 4 separate falls. She is unclear as to why she falls. She does not lose her balance and just remembers being on the ground. She is not clear that she had loss of consciousness. These episodes are not preceded by nausea and vomiting dyspnea, chest pain, palpitations or other neurological symptoms. She has no symptoms after the fall. She does not have significant dizziness with standing. She has dyspnea and mild chest tightness with climbing hills but there is no orthopnea, PND, pedal edema or exertional chest pain. Because of the above we were asked to evaluate.  Current Outpatient Prescriptions  Medication Sig Dispense Refill  . acetaminophen (TYLENOL) 325 MG tablet Take 650 mg by mouth every 6 (six) hours as needed.    Marland Kitchen albuterol (PROVENTIL HFA;VENTOLIN HFA) 108 (90 BASE) MCG/ACT inhaler Inhale 2 puffs into the lungs every 4 (four) hours as needed for wheezing or shortness of breath.    . ALPRAZolam (XANAX) 0.5 MG tablet Take 1 tablet by mouth every 8 hours if needed for anxiety. 30 tablet 2  . amLODipine (NORVASC) 10 MG tablet TAKE 1 TABLET EVERY DAY 90 tablet 3  . aspirin 81 MG tablet Take 81 mg by mouth every morning.     . Calcium Citrate-Vitamin D (CALCIUM + D PO) Take 1 tablet by mouth every morning.    . chlorpheniramine (CHLOR-TRIMETON) 4 MG tablet Take 2 tabs by mouth at bedtime and every 4 hours as needed for drainage/drippy nose/sneezing    . cyclobenzaprine (FLEXERIL) 5 MG tablet Take 1 every 12 hours as needed for muscle relaxant. Caution: May cause drowsiness 20 tablet 0  . famotidine (PEPCID) 20 MG tablet Take 1 tablet (20 mg total) by mouth at bedtime. 30 tablet 11  . fluocinonide ointment (LIDEX) [id number] % Apply 1 application topically as needed.     . fluticasone (FLONASE) 50 MCG/ACT nasal spray Place 1 spray into both nostrils 2 (two) times  daily. 48 g 1  . glucose blood (ONE TOUCH ULTRA TEST) test strip 1 each by Other route daily. Use 1 strips to check blood sugar twice a day Ex E11.9 100 each 3  . glyBURIDE (DIABETA) 2.5 MG tablet Take 1 tablet (2.5 mg total) by mouth daily with breakfast. 30 tablet 5  . ibuprofen (ADVIL,MOTRIN) 200 MG tablet Take 200 mg by mouth every 6 (six) hours as needed.    . Lancets (ONETOUCH ULTRASOFT) lancets PRN 100 each 3  . lovastatin (MEVACOR) 20 MG tablet take 1 tablet by mouth at bedtime 90 tablet 3  . mometasone (ELOCON) 0.1 % ointment     . Multiple Vitamins-Minerals (CENTRUM SILVER PO) Take 1 tablet by mouth every morning.    . naproxen (NAPROSYN) 250 MG tablet Take 1 tablet (250 mg total) by mouth 3 (three) times daily with meals. As needed for pain. 30 tablet 0  . pantoprazole (PROTONIX) 40 MG tablet Take 1 tablet (40 mg total) by mouth 2 (two) times daily before a meal. 180 tablet 1  . potassium chloride SA (K-DUR,KLOR-CON) 20 MEQ tablet TAKE 1 TABLET (20 MEQ TOTAL) BY MOUTH DAILY. 90 tablet 3  . Respiratory Therapy Supplies (FLUTTER) DEVI Use as directed 1 each 0  . sertraline (ZOLOFT) 100 MG tablet TAKE 1 TABLET EVERY MORNING 90 tablet 1  . valsartan-hydrochlorothiazide (DIOVAN HCT) 160-12.5 MG tablet Take 1 tablet by mouth  daily. 90 tablet 1   No current facility-administered medications for this visit.     Allergies  Allergen Reactions  . Metformin And Related Diarrhea  . Penicillins Rash     Past Medical History:  Diagnosis Date  . ALLERGIC RHINITIS   . Allergy   . Anxiety state, unspecified   . Arthritis   . BRONCHITIS, CHRONIC   . DEPRESSION   . DIABETES MELLITUS, TYPE II   . GERD   . HYPERLIPIDEMIA   . HYPERTENSION   . Psoriasis     Past Surgical History:  Procedure Laterality Date  . ABDOMINAL HYSTERECTOMY  1980  . TRIGGER FINGER RELEASE     Index finger    Social History   Social History  . Marital status: Married    Spouse name: N/A  . Number of  children: 0  . Years of education: 83yr colge   Occupational History  . Retired     Social History Main Topics  . Smoking status: Never Smoker  . Smokeless tobacco: Never Used  . Alcohol use No  . Drug use: No  . Sexual activity: Not on file   Other Topics Concern  . Not on file   Social History Narrative   Married, lives with spouse-retired from [employer] insurance   Not employed    Drinks coffee occasional, Consumes 1 soda a day     Family History  Problem Relation Age of Onset  . Stroke Mother   . Angina Mother   . Diabetes Father   . Hyperlipidemia Other     Parent  . Hypertension Other     Parent  . Diabetes Sister     x1  . CAD Sister   . Diabetes Brother     x 2  . Colon cancer Neg Hx   . Esophageal cancer Neg Hx   . Rectal cancer Neg Hx   . Stomach cancer Neg Hx   . Pancreatic cancer Neg Hx     ROS: no fevers or chills, productive cough, hemoptysis, dysphasia, odynophagia, melena, hematochezia, dysuria, hematuria, rash, seizure activity, orthopnea, PND, pedal edema, claudication. Remaining systems are negative.  Physical Exam:   Blood pressure 122/68, pulse 93, height 5' (1.524 m), weight 168 lb 12.8 oz (76.6 kg).  General:  Well developed/well nourished in NAD Skin warm/dry Patient not depressed No peripheral clubbing Back-normal HEENT-normal/normal eyelids Neck supple/normal carotid upstroke bilaterally; no bruits; no JVD; no thyromegaly chest - CTA/ normal expansion CV - RRR/normal S1 and S2; no murmurs, rubs or gallops;  PMI nondisplaced Abdomen -NT/ND, no HSM, no mass, + bowel sounds, no bruit 2+ femoral pulses, no bruits Ext-no edema, chords, 2+ DP Neuro-grossly nonfocal  ECG - sinus rhythm with nonspecific ST changes.  A/P  1 syncope-patient has had 4 falls in the past year and she does not know why she falls. It is not clear that she loses consciousness but history is concerning. No preceding symptoms. I will arrange an echocardiogram to  assess LV function. I will schedule an event monitor to screen for arrhythmias.  2 dyspnea-patient has dyspnea/mild chest tightness with climbing hills. She does have 10 years of diabetes mellitus. Plan stress nuclear study for risk stratification.  3 hypertension-blood pressure controlled. Continue present medications.  4 hyperlipidemia-continue statin.   Kirk Ruths, MD

## 2016-09-04 ENCOUNTER — Ambulatory Visit (INDEPENDENT_AMBULATORY_CARE_PROVIDER_SITE_OTHER): Payer: Medicare Other | Admitting: Cardiology

## 2016-09-04 ENCOUNTER — Other Ambulatory Visit: Payer: Self-pay | Admitting: Internal Medicine

## 2016-09-04 ENCOUNTER — Encounter: Payer: Self-pay | Admitting: Cardiology

## 2016-09-04 ENCOUNTER — Telehealth (HOSPITAL_COMMUNITY): Payer: Self-pay | Admitting: *Deleted

## 2016-09-04 VITALS — BP 122/68 | HR 93 | Ht 60.0 in | Wt 168.8 lb

## 2016-09-04 DIAGNOSIS — I10 Essential (primary) hypertension: Secondary | ICD-10-CM | POA: Diagnosis not present

## 2016-09-04 DIAGNOSIS — R0789 Other chest pain: Secondary | ICD-10-CM | POA: Diagnosis not present

## 2016-09-04 DIAGNOSIS — R0600 Dyspnea, unspecified: Secondary | ICD-10-CM | POA: Diagnosis not present

## 2016-09-04 DIAGNOSIS — R55 Syncope and collapse: Secondary | ICD-10-CM | POA: Diagnosis not present

## 2016-09-04 DIAGNOSIS — E7800 Pure hypercholesterolemia, unspecified: Secondary | ICD-10-CM

## 2016-09-04 NOTE — Patient Instructions (Signed)
Medication Instructions:   NO CHANGE  Testing/Procedures:  Your physician has recommended that you wear an event monitor. Event monitors are medical devices that record the heart's electrical activity. Doctors most often [place] these monitors to diagnose arrhythmias. Arrhythmias are problems with the speed or rhythm of the heartbeat. The monitor is a small, portable device. You can wear one while you do your normal daily activities. This is usually used to diagnose what is causing palpitations/syncope (passing out).   Your physician has requested that you have an echocardiogram. Echocardiography is a painless test that uses sound waves to create images of your heart. It provides your doctor with information about the size and shape of your heart and how well your heart's chambers and valves are working. This procedure takes approximately one hour. There are no restrictions for this procedure.   Your physician has requested that you have en exercise stress myoview. For further information please visit [email or web address]. Please follow instruction sheet, as given.    Follow-Up:  Your physician recommends that you schedule a follow-up appointment in: [place]

## 2016-09-04 NOTE — Addendum Note (Signed)
Addended by: Cristopher Estimable on: 09/04/2016 03:00 PM   Modules accepted: Orders

## 2016-09-06 ENCOUNTER — Ambulatory Visit (HOSPITAL_COMMUNITY)
Admission: RE | Admit: 2016-09-06 | Discharge: 2016-09-06 | Disposition: A | Discharge status: Home / Self Care | Payer: Medicare Other | Source: Ambulatory Visit | Attending: Cardiology | Admitting: Cardiology

## 2016-09-06 DIAGNOSIS — R0789 Other chest pain: Secondary | ICD-10-CM

## 2016-09-06 DIAGNOSIS — R0600 Dyspnea, unspecified: Secondary | ICD-10-CM | POA: Diagnosis not present

## 2016-09-06 LAB — MYOCARDIAL PERFUSION IMAGING
Estimated workload: 4.6 METS
Exercise duration (min): 3 min
Exercise duration (sec): 0 s
LV dias vol: 53 mL (ref 46–106)
LV sys vol: 19 mL
MPHR: 149 {beats}/min
Peak HR: 141 {beats}/min
Percent HR: 94 %
RPE: 19
Rest HR: 88 {beats}/min
SDS: 4
SRS: 2
SSS: 6
TID: 0.99

## 2016-09-06 MED ORDER — TECHNETIUM TC 99M TETROFOSMIN IV KIT
30.5000 | PACK | Freq: Once | INTRAVENOUS | Status: AC | PRN
  Administered 2016-09-06: 30.5 via INTRAVENOUS
  Filled 2016-09-06: qty 31

## 2016-09-06 MED ORDER — TECHNETIUM TC 99M TETROFOSMIN IV KIT
10.7000 | PACK | Freq: Once | INTRAVENOUS | Status: AC | PRN
  Administered 2016-09-06: 10.7 via INTRAVENOUS
  Filled 2016-09-06: qty 11

## 2016-09-09 ENCOUNTER — Encounter (HOSPITAL_BASED_OUTPATIENT_CLINIC_OR_DEPARTMENT_OTHER): Payer: Self-pay | Admitting: *Deleted

## 2016-09-09 ENCOUNTER — Telehealth: Payer: Self-pay | Admitting: Internal Medicine

## 2016-09-09 ENCOUNTER — Emergency Department (HOSPITAL_BASED_OUTPATIENT_CLINIC_OR_DEPARTMENT_OTHER): Payer: Medicare Other

## 2016-09-09 ENCOUNTER — Telehealth: Payer: Self-pay | Admitting: Cardiology

## 2016-09-09 ENCOUNTER — Emergency Department (HOSPITAL_BASED_OUTPATIENT_CLINIC_OR_DEPARTMENT_OTHER)
Admission: EM | Admit: 2016-09-09 | Discharge: 2016-09-09 | Disposition: A | Discharge status: Home / Self Care | Payer: Medicare Other | Attending: Emergency Medicine | Admitting: Emergency Medicine

## 2016-09-09 DIAGNOSIS — Z7982 Long term (current) use of aspirin: Secondary | ICD-10-CM | POA: Diagnosis not present

## 2016-09-09 DIAGNOSIS — I10 Essential (primary) hypertension: Secondary | ICD-10-CM | POA: Insufficient documentation

## 2016-09-09 DIAGNOSIS — E119 Type 2 diabetes mellitus without complications: Secondary | ICD-10-CM | POA: Diagnosis not present

## 2016-09-09 DIAGNOSIS — Z79899 Other long term (current) drug therapy: Secondary | ICD-10-CM | POA: Diagnosis not present

## 2016-09-09 DIAGNOSIS — M25571 Pain in right ankle and joints of right foot: Secondary | ICD-10-CM | POA: Diagnosis not present

## 2016-09-09 MED ORDER — IBUPROFEN 400 MG PO TABS
600.0000 mg | ORAL_TABLET | Freq: Once | ORAL | Status: AC
  Administered 2016-09-09: 600 mg via ORAL
  Filled 2016-09-09: qty 1

## 2016-09-09 MED ORDER — IBUPROFEN 600 MG PO TABS: 600.0000 mg | ORAL_TABLET | Freq: Four times a day (QID) | ORAL | 0 refills | Status: DC | PRN

## 2016-09-09 NOTE — ED Provider Notes (Signed)
[facility] Provider Note   CSN: [id number] Arrival date & time: 09/09/16  1022     History   Chief Complaint Chief Complaint  Patient presents with  . Ankle Pain    HPI Heidi Franklin is a 70 y.o. female.  The history is provided by the patient and medical records. No language interpreter was used.   Heidi Franklin is a 70 y.o. female  with a PMH of HTN, HLD, DM2 who presents to the Emergency Department complaining of worsening aching right ankle pain x 2-3 days. Patient states that she had an exercise stress test on Friday. She woke up Saturday morning and noticed that her right ankle was aching and a little stiff. Symptoms continued to worsen despite naproxen and ice. She denies any known injury or fall. Denies warmth or redness to the area. No fevers. Pain is worse with ambulation and movement, better with rest.  Past Medical History:  Diagnosis Date  . ALLERGIC RHINITIS   . Allergy   . Anxiety state, unspecified   . Arthritis   . BRONCHITIS, CHRONIC   . DEPRESSION   . DIABETES MELLITUS, TYPE II   . GERD   . HYPERLIPIDEMIA   . HYPERTENSION   . Psoriasis     Patient Active Problem List   Diagnosis Date Noted  . Fall 08/08/2016  . Neck pain 07/29/2016  . Degenerative arthritis of right knee 06/20/2016  . Foot pain, left 05/28/2016  . Degenerative arthritis of left knee 04/10/2016  . Acute bronchitis 12/15/2015  . Peroneal tendinitis of left lower extremity 09/05/2015  . Recurrent falls 08/08/2015  . Bursitis of left shoulder 07/13/2015  . Morbid obesity ([place]) 04/16/2015  . Nonspecific abnormal electrocardiogram (ECG) (EKG) 05/11/2011  . Tenosynovitis of finger 04/22/2011  . Type 2 diabetes, uncontrolled, with neuropathy ([place]) 03/02/2010  . Hyperlipidemia associated with type 2 diabetes mellitus ([place]) 03/02/2010  . Depression with anxiety 03/02/2010  . Essential hypertension 03/02/2010  . Chronic rhinitis 03/02/2010  . Upper airway cough syndrome  03/02/2010  . GERD 03/02/2010    Past Surgical History:  Procedure Laterality Date  . ABDOMINAL HYSTERECTOMY  1980  . TRIGGER FINGER RELEASE     Index finger    OB History    No data available       Home Medications    Prior to Admission medications   Medication Sig Start Date End Date Taking? Authorizing Provider  acetaminophen (TYLENOL) 325 MG tablet Take 650 mg by mouth every 6 (six) hours as needed.    Historical Provider, MD  ALPRAZolam Duanne Moron) 0.5 MG tablet Take 1 tablet by mouth every 8 hours if needed for anxiety. 02/22/16   Binnie Rail, MD  amLODipine (NORVASC) 10 MG tablet TAKE 1 TABLET EVERY DAY 04/16/16   Binnie Rail, MD  aspirin 81 MG tablet Take 81 mg by mouth every morning.     Historical Provider, MD  Calcium Citrate-Vitamin D (CALCIUM + D PO) Take 1 tablet by mouth every morning.    Historical Provider, MD  chlorpheniramine (CHLOR-TRIMETON) 4 MG tablet Take 2 tabs by mouth at bedtime and every 4 hours as needed for drainage/drippy nose/sneezing    Historical Provider, MD  cyclobenzaprine (FLEXERIL) 5 MG tablet Take 1 every 12 hours as needed for muscle relaxant. Caution: May cause drowsiness 07/25/16   Jacqulyn Cane, MD  famotidine (PEPCID) 20 MG tablet Take 1 tablet (20 mg total) by mouth at bedtime. 06/15/15   Rowe Clack, MD  fluocinonide ointment (LIDEX) [id number] % Apply 1 application topically as needed.  06/11/16   Historical Provider, MD  fluticasone (FLONASE) 50 MCG/ACT nasal spray Place 1 spray into both nostrils 2 (two) times daily. 01/19/16   Binnie Rail, MD  glucose blood (ONE TOUCH ULTRA TEST) test strip 1 each by Other route daily. Use 1 strips to check blood sugar twice a day Ex E11.9 01/01/16   Binnie Rail, MD  glyBURIDE (DIABETA) 2.5 MG tablet Take 1 tablet (2.5 mg total) by mouth daily with breakfast. 02/22/16   Binnie Rail, MD  ibuprofen (ADVIL,MOTRIN) 600 MG tablet Take 1 tablet (600 mg total) by mouth every 6 (six) hours as needed. 09/09/16    Ozella Almond Theophilus Walz, PA-C  Lancets [facility] ULTRASOFT) lancets PRN 01/01/16   Binnie Rail, MD  lovastatin (MEVACOR) 20 MG tablet take 1 tablet by mouth at bedtime 04/08/16   Binnie Rail, MD  mometasone (ELOCON) 0.1 % ointment  05/16/16   Historical Provider, MD  Multiple Vitamins-Minerals (CENTRUM SILVER PO) Take 1 tablet by mouth every morning.    Historical Provider, MD  naproxen (NAPROSYN) 250 MG tablet Take 1 tablet (250 mg total) by mouth 3 (three) times daily with meals. As needed for pain. 07/25/16   Jacqulyn Cane, MD  pantoprazole (PROTONIX) 40 MG tablet Take 1 tablet (40 mg total) by mouth 2 (two) times daily before a meal. 05/07/16   Binnie Rail, MD  potassium chloride SA (K-DUR,KLOR-CON) 20 MEQ tablet TAKE 1 TABLET (20 MEQ TOTAL) BY MOUTH DAILY. 03/01/16   Binnie Rail, MD  Respiratory Therapy Supplies (FLUTTER) DEVI Use as directed 08/03/15   Tanda Rockers, MD  sertraline (ZOLOFT) 100 MG tablet TAKE 1 TABLET EVERY MORNING 02/15/16   Binnie Rail, MD  valsartan-hydrochlorothiazide (DIOVAN HCT) 160-12.5 MG tablet Take 1 tablet by mouth daily. 08/08/16   Binnie Rail, MD  VENTOLIN HFA 108 (90 Base) MCG/ACT inhaler inhale 2 puffs by mouth every 6 hours if needed for wheezing 09/05/16   Tanda Rockers, MD    Family History Family History  Problem Relation Age of Onset  . Stroke Mother   . Angina Mother   . Diabetes Father   . Hyperlipidemia Other     Parent  . Hypertension Other     Parent  . Diabetes Sister     x1  . CAD Sister   . Diabetes Brother     x 2  . Colon cancer Neg Hx   . Esophageal cancer Neg Hx   . Rectal cancer Neg Hx   . Stomach cancer Neg Hx   . Pancreatic cancer Neg Hx     Social History Social History  Substance Use Topics  . Smoking status: Never Smoker  . Smokeless tobacco: Never Used  . Alcohol use No     Allergies   Metformin and related and Penicillins   Review of Systems Review of Systems  Constitutional: Negative for fever.    Musculoskeletal: Positive for arthralgias, joint swelling and myalgias.  Skin: Negative for color change.  Neurological: Negative for weakness and numbness.     Physical Exam Updated Vital Signs BP 134/68   Pulse 94   Temp 98 F (36.7 C) (Oral)   Resp 16   SpO2 97%   Physical Exam  Constitutional: She is oriented to person, place, and time. She appears well-developed and well-nourished. No distress.  HENT:  Head: Normocephalic and atraumatic.  Cardiovascular: Normal  rate, regular rhythm and normal heart sounds.   No murmur heard. Pulmonary/Chest: Effort normal and breath sounds normal. No respiratory distress.  Musculoskeletal:       Feet:  Right ankle: + swelling, TTP around bilateral malleoli. No pain to the fifth metatarsal area, navicular region or calf tenderness. Achilles intact. Full range of motion. No erythema or warmth overlaying the joint. 2+ DP pulses, sensation intact to medial, lateral, dorsal and plantar aspects.  Neurological: She is alert and oriented to person, place, and time.  Skin: Skin is warm and dry.  Nursing note and vitals reviewed.    ED Treatments / Results  Labs (all labs ordered are listed, but only abnormal results are displayed) Labs Reviewed - No data to display  EKG  EKG Interpretation None       Radiology Dg Ankle Complete Right  Result Date: 09/09/2016 CLINICAL DATA:  Right lateral ankle pain and tenderness with swelling since Friday. Patient rolled her right ankle during distress test. EXAM: RIGHT ANKLE - COMPLETE 3+ VIEW COMPARISON:  None. FINDINGS: No evidence of fracture. No subluxation or dislocation. Ankle mortise is preserved. Degenerative changes are noted. IMPRESSION: Negative. Electronically Signed   By: Misty Stanley M.D.   On: 09/09/2016 11:26    Procedures Procedures (including critical care time)  Medications Ordered in ED Medications  ibuprofen (ADVIL,MOTRIN) tablet 600 mg (600 mg Oral Given 09/09/16 1132)      Initial Impression / Assessment and Plan / ED Course  I have reviewed the triage vital signs and the nursing notes.  Pertinent labs & imaging results that were available during my care of the patient were reviewed by me and considered in my medical decision making (see chart for details).  Clinical Course    Heidi Franklin is a 70 y.o. female who presents to ED for right ankle pain/swelling. On exam, bilateral LE's are NVI. No warmth or erythema to suggest infectious etiology. X-ray obtained and reassuring. Initially was going to place in CAM walker in ED, however when patient saw the boot, she states that she has a similar boot at home and would like to use it instead. Patient was able to ambulate in ED. Discussed boot use and symptomatic home care instructions. She agrees to follow up with her sports medicine physician at the end of the week or early next week for re-evaluation. Reasons to return to ED discussed and all questions answered.   Patient discussed with Dr. Ellender Hose who agrees with treatment plan.   Final Clinical Impressions(s) / ED Diagnoses   Final diagnoses:  Acute right ankle pain    New Prescriptions New Prescriptions   IBUPROFEN (ADVIL,MOTRIN) 600 MG TABLET    Take 1 tablet (600 mg total) by mouth every 6 (six) hours as needed.     Ozella Almond Namrata Dangler, PA-C 09/09/16 1254    Duffy Bruce, MD 09/09/16 [phone]

## 2016-09-09 NOTE — Telephone Encounter (Signed)
New Message  Pt call requesting to speak with RN. Pt states since her stress test on 11/10 she has been experiencing a lot of pressure on her ankle. Pt states she is having trouble walking. Please call back to discuss

## 2016-09-09 NOTE — Telephone Encounter (Signed)
I spoke to this patient. She is c/o ankle pain which is not new. She states she had this prior to treadmill test on 11/10. Unclear if she made [place] aware of this prior to test. She has had previous OV w Dr. Quay Burow and podiatry.  C/o pain w bearing weight on foot over weekend. I advised to seek evaluation in urgent care - optionally call Dr. Quay Burow' office to advise as they have seen her for similar complaints. Pt voiced understanding and thanks.

## 2016-09-09 NOTE — ED Notes (Signed)
Pt to speak with Provider wishes to decline cam walker, provider and pt decided to use the one pt has at home.

## 2016-09-09 NOTE — Discharge Instructions (Signed)
Ibuprofen as needed for pain. Ice affected area for pain and swelling relief. Keep your right lower extremity elevated as much as possible throughout the day. Follow up with your sports medicine / orthopedic physician at the end of this week or beginning of next. Wear boot whenever you are walking around, but take boot off when sleeping. Return to ER for new or worsening symptoms, any additional concerns.   COLD THERAPY DIRECTIONS:  Ice or gel packs can be used to reduce both pain and swelling. Ice is the most helpful within the first 24 to 48 hours after an injury or flareup from overusing a muscle or joint.  Ice is effective, has very few side effects, and is safe for most people to use.   If you expose your skin to cold temperatures for too long or without the proper protection, you can damage your skin or nerves. Watch for signs of skin damage due to cold.   HOME CARE INSTRUCTIONS  Follow these tips to use ice and cold packs safely.  Place a dry or damp towel between the ice and skin. A damp towel will cool the skin more quickly, so you may need to shorten the time that the ice is used.  For a more rapid response, add gentle compression to the ice.  Ice for no more than 10 to 20 minutes at a time. The bonier the area you are icing, the less time it will take to get the benefits of ice.  Check your skin after 5 minutes to make sure there are no signs of a poor response to cold or skin damage.  Rest 20 minutes or more in between uses.  Once your skin is numb, you can end your treatment. You can test numbness by very lightly touching your skin. The touch should be so light that you do not see the skin dimple from the pressure of your fingertip. When using ice, most people will feel these normal sensations in this order: cold, burning, aching, and numbness.

## 2016-09-09 NOTE — Telephone Encounter (Signed)
Patient Name: Heidi Franklin DOB: 1946/01/09 Initial Comment Caller states c/o foot pain. Nurse Assessment Nurse: Heidi Sa, RN, Heidi Franklin Date/Time (Eastern Time): 09/09/2016 9:42:15 AM Confirm and document reason for call. If symptomatic, describe symptoms. You must click the next button to save text entered. ---Heidi Franklin states she developed right ankle pain (rated as an 8 on the 1 to 10 scale) and swelling 2 days ago. She did a stress test on Friday. She thinks she twisted the ankle last Tuesday or Wednesday. She thinks she developed a low grade fever yesterday. Alert and responsive. Has the patient traveled out of the country within the last 30 days? ---No Does the patient have any new or worsening symptoms? ---Yes Will a triage be completed? ---Yes Related visit to physician within the last 2 weeks? ---Yes Does the PT have any chronic conditions? (i.e. diabetes, asthma, etc.) ---Yes List chronic conditions. ---Allergies, Chronic Bronchitis, Stress test last Friday due to episodes of falling, Diabetes, High Blood Pressure and Cholesterol Is this a behavioral health or substance abuse call? ---No Guidelines Guideline Title Affirmed Question Affirmed Notes Foot and Ankle Injury Can't stand (bear weight) or walk Final Disposition User Go to ED Now [place], RN, Heidi Franklin Comments Heidi Franklin shares she is unable to walk without her husband helping her. Referrals [facility] - ED Disagree/Comply: Comply

## 2016-09-09 NOTE — ED Triage Notes (Signed)
Pt reports right ankle pain and swelling since her treadmill stress test on Friday. No memory of specific injury, "it came on later".

## 2016-09-10 ENCOUNTER — Ambulatory Visit: Payer: Medicare Other | Admitting: Family Medicine

## 2016-09-10 ENCOUNTER — Telehealth: Payer: Self-pay | Admitting: Cardiology

## 2016-09-10 NOTE — Telephone Encounter (Signed)
Questions addressed and I recommended patient proceed w echo as scheduled for a baseline eval of heart structures, pumping mechanics of heart, etc. She voiced understanding of recommendations.

## 2016-09-10 NOTE — Telephone Encounter (Signed)
New message  Pt called and is asking should she still have the Echo  Returning Heidi Franklin' call

## 2016-09-13 ENCOUNTER — Other Ambulatory Visit: Payer: Self-pay | Admitting: Internal Medicine

## 2016-09-16 ENCOUNTER — Encounter: Payer: Self-pay | Admitting: *Deleted

## 2016-09-16 NOTE — Progress Notes (Signed)
Patient ID: Heidi Franklin, female   DOB: 25-May-1946, 70 y.o.   MRN: [id number] Patient enrolled for [employer] to mail a cardiac event monitor to her home.

## 2016-09-17 ENCOUNTER — Encounter: Payer: Self-pay | Admitting: Family Medicine

## 2016-09-17 ENCOUNTER — Ambulatory Visit: Payer: Self-pay

## 2016-09-17 ENCOUNTER — Ambulatory Visit (INDEPENDENT_AMBULATORY_CARE_PROVIDER_SITE_OTHER): Payer: Medicare Other | Admitting: Family Medicine

## 2016-09-17 ENCOUNTER — Telehealth: Payer: Self-pay | Admitting: Internal Medicine

## 2016-09-17 VITALS — BP 122/60 | HR 96 | Ht 60.0 in | Wt 167.0 lb

## 2016-09-17 DIAGNOSIS — M25571 Pain in right ankle and joints of right foot: Secondary | ICD-10-CM | POA: Diagnosis not present

## 2016-09-17 DIAGNOSIS — S93401A Sprain of unspecified ligament of right ankle, initial encounter: Secondary | ICD-10-CM | POA: Diagnosis not present

## 2016-09-17 NOTE — Assessment & Plan Note (Signed)
New patient. patient does have more of a right ankle sprain. We discussed icing regimen, home exercises, topical anti-inflammatories as well as potential bracing. Work with Product/process development scientist to learn home exercises in greater detail. There is some underlying arthritis that is also likely contribute in. Patient will start to increase activity as tolerated. Follow-up again in 4 weeks if not completely resolved.

## 2016-09-17 NOTE — Telephone Encounter (Signed)
Spoke with pt. And she was looking for samples of Ventolin because she stated it was too expensive. She stated she was given a sample of this the last time she saw Dr. Melvyn Novas. I did explain to her that we do not carry samples of this. Nothing further is needed.

## 2016-09-17 NOTE — Patient Instructions (Addendum)
Good to see you  Happy holidays!  Ice 20 minutes 2 times daily. Usually after activity and before bed. pennsaid pinkie amount topically 2 times daily as needed.  Exercises 3 times a week.  Avoid twisting the ankle until after the weekend.  Eat too much [place]! You should be healed in 2 weeks. If not see me again

## 2016-09-17 NOTE — Progress Notes (Signed)
[facility] [address] [place], [place] [address] Phone: [phone] Subjective:    CC: Right ankle pain  [id number]  Heidi Franklin is a 70 y.o. female coming in with complaint of right ankle pain. Patient did take a stress test on the 12th of this month. #The day had difficulty even walking. Went to the emergency room. They're patient did have x-rays. X-rays were individually visualized by me showing some mild arthritic changes but otherwise fairly unremarkable. Patient put on a Cam Walker for approximate 5 days and it did seem to help. Has transition back to a shoe. Improving but continues to have a 4 out of 10 pain. Denies any numbness or swelling. States it did swell initially. Patient is able to do daily activities but does have some soreness at the end. No significant instability. Has responded to over-the-counter medications.     Past Medical History:  Diagnosis Date  . ALLERGIC RHINITIS   . Allergy   . Anxiety state, unspecified   . Arthritis   . BRONCHITIS, CHRONIC   . DEPRESSION   . DIABETES MELLITUS, TYPE II   . GERD   . HYPERLIPIDEMIA   . HYPERTENSION   . Psoriasis    Past Surgical History:  Procedure Laterality Date  . ABDOMINAL HYSTERECTOMY  1980  . TRIGGER FINGER RELEASE     Index finger   Social History   Social History  . Marital status: Married    Spouse name: N/A  . Number of children: 0  . Years of education: 25yr colge   Occupational History  . Retired     Social History Main Topics  . Smoking status: Never Smoker  . Smokeless tobacco: Never Used  . Alcohol use No  . Drug use: No  . Sexual activity: Not Asked   Other Topics Concern  . 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 Metformin And Related Diarrhea  . Penicillins Rash   Family History  Problem  Relation Age of Onset  . Stroke Mother   . Angina Mother   . Diabetes Father   . Hyperlipidemia Other     Parent  . Hypertension Other     Parent  . Diabetes Sister     x1  . CAD Sister   . Diabetes Brother     x 2  . Colon cancer Neg Hx   . Esophageal cancer Neg Hx   . Rectal cancer Neg Hx   . Stomach cancer Neg Hx   . Pancreatic cancer Neg Hx     Past medical history, social, surgical and family history all reviewed in electronic medical record.  No pertanent information unless stated regarding to the chief complaint.   Review of Systems:Review of systems updated and as accurate as of 09/17/16  No headache, visual changes, nausea, vomiting, diarrhea, constipation, dizziness, abdominal pain, skin rash, fevers, chills, night sweats, weight loss, swollen lymph nodes, body aches, joint swelling, muscle aches, chest pain, shortness of breath, mood changes.   Objective  Blood pressure 122/60, pulse 96, height 5' (1.524 m), weight 167 lb (75.8 kg), SpO2 98 %. Systems examined below as of 09/17/16   General: No apparent distress alert and oriented x3 mood and affect normal, dressed appropriately.  HEENT: Pupils equal, extraocular movements intact  Respiratory:  Patient's speak in full sentences and does not appear short of breath  Cardiovascular: No lower extremity edema, non tender, no erythema  Skin: Warm dry intact with no signs of infection or rash on extremities or on axial skeleton.  Abdomen: Soft nontender  Neuro: Cranial nerves II through XII are intact, neurovascularly intact in all extremities with 2+ DTRs and 2+ pulses.  Lymph: No lymphadenopathy of posterior or anterior cervical chain or axillae bilaterally.  Gait Antalgic gait MSK:  Non tender with full range of motion and good stability and symmetric strength and tone of shoulders, elbows, wrist, hip, knee and bilaterally. Arthritic changes of multiple joints Ankle:right  No visible erythema or swelling. Lacks last  2-5 in all planes of range of motion but very similar to the contralateral side Strength is 5/5 in all directions. Pain with stretching the ATFL negative drawer Talar dome moderately tender; No pain at base of 5th MT; No tenderness over cuboid; No tenderness over N spot or navicular prominence No tenderness on posterior aspects of lateral and medial malleolus No sign of peroneal tendon subluxations or tenderness to palpation Negative tarsal tunnel tinel's Able to walk 4 steps. Contralateral ankle has some mild decrease in range of motion.  MSK US performed of: Right ankle This study was ordered, performed, and interpreted by Charlann Boxer D.O.  Foot/Ankle:   All structures visualized.   Talar dome moderately arthritic changes Ankle mortise trace effusion. Peroneus longus and brevis tendons unremarkable on long and transverse views without sheath effusions. Posterior tibialis, flexor hallucis longus, and flexor digitorum longus tendons unremarkable on long and transverse views without sheath effusions. Anterior Talofibular Ligament has some degenerative tearing. Still seems to be intact with dynamic testing . Power doppler signal normal.  IMPRESSION:  Grade 1 ankle sprain with underlying arthritic changes of the ankle.  Procedure note [id number]; 15 minutes spent for Therapeutic exercises as stated in above notes.  This included exercises focusing on stretching, strengthening, with significant focus on eccentric aspects. Ankle strengthening that included:  Basic range of motion exercises to allow proper full motion at ankle Stretching of the lower leg and hamstrings  Theraband exercises for the lower leg - inversion, eversion, dorsiflexion and plantarflexion each to be completed with a theraband Balance exercises to increase proprioception Weight bearing exercises to increase strength and balance   Proper technique shown and discussed handout in great detail with ATC.  All questions were  discussed and answered.  .      Impression and Recommendations:     This case required medical decision making of moderate complexity.      Note: This dictation was prepared with Dragon dictation along with smaller phrase technology. Any transcriptional errors that result from this process are unintentional.

## 2016-09-18 ENCOUNTER — Telehealth: Payer: Self-pay | Admitting: Cardiology

## 2016-09-18 ENCOUNTER — Ambulatory Visit (HOSPITAL_BASED_OUTPATIENT_CLINIC_OR_DEPARTMENT_OTHER)
Admission: RE | Admit: 2016-09-18 | Discharge: 2016-09-18 | Disposition: A | Discharge status: Home / Self Care | Payer: Medicare Other | Source: Ambulatory Visit | Attending: Cardiology | Admitting: Cardiology

## 2016-09-18 DIAGNOSIS — R55 Syncope and collapse: Secondary | ICD-10-CM | POA: Insufficient documentation

## 2016-09-18 NOTE — Telephone Encounter (Signed)
Left message for patient, her monitor is for 30 days and she should not return until after then.

## 2016-09-18 NOTE — Telephone Encounter (Signed)
New message   Pt verbalized that she is returning the call about her echo and the Holter monitor she wants to know if she should return it

## 2016-09-18 NOTE — Progress Notes (Signed)
Echocardiogram 2D Echocardiogram has been performed.  Heidi Franklin 09/18/2016, 9:48 AM

## 2016-09-20 DIAGNOSIS — R55 Syncope and collapse: Secondary | ICD-10-CM | POA: Diagnosis not present

## 2016-09-23 ENCOUNTER — Ambulatory Visit (INDEPENDENT_AMBULATORY_CARE_PROVIDER_SITE_OTHER): Payer: Medicare Other

## 2016-09-23 DIAGNOSIS — R55 Syncope and collapse: Secondary | ICD-10-CM | POA: Diagnosis not present

## 2016-09-25 DIAGNOSIS — H25813 Combined forms of age-related cataract, bilateral: Secondary | ICD-10-CM | POA: Diagnosis not present

## 2016-09-25 DIAGNOSIS — H43392 Other vitreous opacities, left eye: Secondary | ICD-10-CM | POA: Diagnosis not present

## 2016-10-09 ENCOUNTER — Ambulatory Visit: Payer: Medicare Other | Admitting: Cardiology

## 2016-10-09 ENCOUNTER — Ambulatory Visit (INDEPENDENT_AMBULATORY_CARE_PROVIDER_SITE_OTHER): Payer: Medicare Other | Admitting: Internal Medicine

## 2016-10-09 ENCOUNTER — Encounter: Payer: Self-pay | Admitting: Internal Medicine

## 2016-10-09 ENCOUNTER — Ambulatory Visit (INDEPENDENT_AMBULATORY_CARE_PROVIDER_SITE_OTHER)
Admission: RE | Admit: 2016-10-09 | Discharge: 2016-10-09 | Disposition: A | Discharge status: Home / Self Care | Payer: Medicare Other | Source: Ambulatory Visit | Attending: Internal Medicine | Admitting: Internal Medicine

## 2016-10-09 ENCOUNTER — Other Ambulatory Visit (INDEPENDENT_AMBULATORY_CARE_PROVIDER_SITE_OTHER): Payer: Medicare Other

## 2016-10-09 VITALS — BP 128/58 | HR 98 | Temp 98.5°F | Resp 16 | Wt 166.0 lb

## 2016-10-09 DIAGNOSIS — M5136 Other intervertebral disc degeneration, lumbar region: Secondary | ICD-10-CM | POA: Diagnosis not present

## 2016-10-09 DIAGNOSIS — M47816 Spondylosis without myelopathy or radiculopathy, lumbar region: Secondary | ICD-10-CM | POA: Diagnosis not present

## 2016-10-09 DIAGNOSIS — M545 Low back pain, unspecified: Secondary | ICD-10-CM

## 2016-10-09 DIAGNOSIS — M5441 Lumbago with sciatica, right side: Secondary | ICD-10-CM | POA: Diagnosis not present

## 2016-10-09 DIAGNOSIS — R1031 Right lower quadrant pain: Secondary | ICD-10-CM | POA: Insufficient documentation

## 2016-10-09 DIAGNOSIS — G8929 Other chronic pain: Secondary | ICD-10-CM

## 2016-10-09 HISTORY — DX: Other chronic pain: G89.29

## 2016-10-09 HISTORY — DX: Low back pain, unspecified: M54.50

## 2016-10-09 LAB — CBC WITH DIFFERENTIAL/PLATELET
Basophils Absolute: 0 10*3/uL (ref 0.0–0.1)
Basophils Relative: 0.3 % (ref 0.0–3.0)
Eosinophils Absolute: 0.3 10*3/uL (ref 0.0–0.7)
Eosinophils Relative: 2.2 % (ref 0.0–5.0)
HCT: 34.1 % — ABNORMAL LOW (ref 36.0–46.0)
Hemoglobin: 11.2 g/dL — ABNORMAL LOW (ref 12.0–15.0)
Lymphocytes Relative: 22.9 % (ref 12.0–46.0)
Lymphs Abs: 3 10*3/uL (ref 0.7–4.0)
MCHC: 32.9 g/dL (ref 30.0–36.0)
MCV: 84.7 fl (ref 78.0–100.0)
Monocytes Absolute: 1 10*3/uL (ref 0.1–1.0)
Monocytes Relative: 7.7 % (ref 3.0–12.0)
Neutro Abs: 8.7 10*3/uL — ABNORMAL HIGH (ref 1.4–7.7)
Neutrophils Relative %: 66.9 % (ref 43.0–77.0)
Platelets: 346 10*3/uL (ref 150.0–400.0)
RBC: 4.03 Mil/uL (ref 3.87–5.11)
RDW: 14.5 % (ref 11.5–15.5)
WBC: 13 10*3/uL — ABNORMAL HIGH (ref 4.0–10.5)

## 2016-10-09 LAB — COMPREHENSIVE METABOLIC PANEL
ALT: 13 U/L (ref 0–35)
AST: 14 U/L (ref 0–37)
Albumin: 4.1 g/dL (ref 3.5–5.2)
Alkaline Phosphatase: 86 U/L (ref 39–117)
BUN: 16 mg/dL (ref 6–23)
CO2: 28 mEq/L (ref 19–32)
Calcium: 9.8 mg/dL (ref 8.4–10.5)
Chloride: 104 mEq/L (ref 96–112)
Creatinine, Ser: 0.94 mg/dL (ref 0.40–1.20)
GFR: 75.37 mL/min (ref >60.00)
Glucose, Bld: 65 mg/dL — ABNORMAL LOW (ref 70–99)
Potassium: 3.2 mEq/L — ABNORMAL LOW (ref 3.5–5.1)
Sodium: 141 mEq/L (ref 135–145)
Total Bilirubin: 0.3 mg/dL (ref 0.2–1.2)
Total Protein: 8 g/dL (ref 6.0–8.3)

## 2016-10-09 MED ORDER — NAPROXEN 250 MG PO TABS: 250.0000 mg | ORAL_TABLET | Freq: Three times a day (TID) | ORAL | 0 refills | Status: DC

## 2016-10-09 NOTE — Assessment & Plan Note (Signed)
Right lower quadrant pain since last week, but has had prior episodes that it has not lasted this long Only exacerbating factor is bending over She is unsure if it is related to her back or separate Abdominal exam benign-no tenderness on exam ? Related to lumbar radiculopathy versus abdominal pain/hernia Discussed possibly getting imaging and we deferred for now, but if no improvement she will need a CAT scan of her abdomen and pelvis Will continue naproxen for lumbar radiculopathy-she has not tolerated gabapentin the past Check CBC, CMP and urinalysis/culture Call or return if no improvement

## 2016-10-09 NOTE — Patient Instructions (Signed)
An xray and blood work was ordered.  Test(s) ordered today. Your results will be released to [facility] (or called to you) after review, usually within 72hours after test completion. If any changes need to be made, you will be notified at that same time.  All other Health Maintenance issues reviewed.   All recommended immunizations and age-appropriate screenings are up-to-date or discussed.  No immunizations administered today.   Medications reviewed and updated.  Changes include taking the naproxen.   Your prescription(s) have been submitted to your pharmacy. Please take as directed and contact our office if you believe you are having problem(s) with the medication(s).

## 2016-10-09 NOTE — Assessment & Plan Note (Signed)
Having pain across her lower back with pain down her right leg No prior back imaging - lumbar x-ray today Some relief with naproxen Deferred physical therapy Deferred referral to orthopedics/sports medicine at this time Has not tolerated gabapentin the past We'll continue naproxen twice daily Her pain is better here today and hopefully will continue to improve, if it does not we'll need to pursue physical therapy or referral to a specialist

## 2016-10-09 NOTE — Progress Notes (Signed)
Pre visit review using our clinic review tool, if applicable. No additional management support is needed unless otherwise documented below in the visit note. 

## 2016-10-09 NOTE — Progress Notes (Signed)
Subjective:    Patient ID: Heidi Franklin, female    DOB: [birth year], 70 y.o.   MRN: [id number]  HPI She is here for an acute visit.   Lower back pain, right groin pain:  The pain started last week.  She has pain in her right lower quadrant/groin area. This pain she has had in the past. She is unsure if it is related to her back pain or something separate. The pain comes and goes and currently she does not have any pain. The pain in her lower back is across her lower back. She has had some pain in the right leg down the lateral aspect of the leg. She denies any numbness, tingling or weakness in the leg. She does have some mild weakness in both legs, but this is chronic. Bending over makes her pain worse.  She denies anything else that exacerbates the pain. She has tried taking Tylenol, Advil and naproxen. Naproxen has helped the pain.  She denies fevers, change in her urination and changes in her stools.   Medications and allergies reviewed with patient and updated if appropriate.  Patient Active Problem List   Diagnosis Date Noted  . Moderate right ankle sprain 09/17/2016  . Fall 08/08/2016  . Neck pain 07/29/2016  . Degenerative arthritis of right knee 06/20/2016  . Foot pain, left 05/28/2016  . Degenerative arthritis of left knee 04/10/2016  . Acute bronchitis 12/15/2015  . Peroneal tendinitis of left lower extremity 09/05/2015  . Recurrent falls 08/08/2015  . Bursitis of left shoulder 07/13/2015  . Morbid obesity ([place]) 04/16/2015  . Nonspecific abnormal electrocardiogram (ECG) (EKG) 05/11/2011  . Tenosynovitis of finger 04/22/2011  . Type 2 diabetes, uncontrolled, with neuropathy ([place]) 03/02/2010  . Hyperlipidemia associated with type 2 diabetes mellitus ([place]) 03/02/2010  . Depression with anxiety 03/02/2010  . Essential hypertension 03/02/2010  . Chronic rhinitis 03/02/2010  . Upper airway cough syndrome 03/02/2010  . GERD 03/02/2010    Current Outpatient Prescriptions on  File Prior to Visit  Medication Sig Dispense Refill  . acetaminophen (TYLENOL) 325 MG tablet Take 650 mg by mouth every 6 (six) hours as needed.    . ALPRAZolam (XANAX) 0.5 MG tablet Take 1 tablet by mouth every 8 hours if needed for anxiety. 30 tablet 2  . amLODipine (NORVASC) 10 MG tablet TAKE 1 TABLET EVERY DAY 90 tablet 3  . aspirin 81 MG tablet Take 81 mg by mouth every morning.     . Calcium Citrate-Vitamin D (CALCIUM + D PO) Take 1 tablet by mouth every morning.    . chlorpheniramine (CHLOR-TRIMETON) 4 MG tablet Take 2 tabs by mouth at bedtime and every 4 hours as needed for drainage/drippy nose/sneezing    . cyclobenzaprine (FLEXERIL) 5 MG tablet Take 1 every 12 hours as needed for muscle relaxant. Caution: May cause drowsiness 20 tablet 0  . famotidine (PEPCID) 20 MG tablet Take 1 tablet (20 mg total) by mouth at bedtime. 30 tablet 11  . fluocinonide ointment (LIDEX) [id number] % Apply 1 application topically as needed.     . fluticasone (FLONASE) 50 MCG/ACT nasal spray USE 1 SPRAY IN EACH NOSTRIL TWICE DAILY 48 g 1  . glucose blood (ONE TOUCH ULTRA TEST) test strip 1 each by Other route daily. Use 1 strips to check blood sugar twice a day Ex E11.9 100 each 3  . glyBURIDE (DIABETA) 2.5 MG tablet Take 1 tablet (2.5 mg total) by mouth daily with breakfast. 30 tablet 5  .  ibuprofen (ADVIL,MOTRIN) 600 MG tablet Take 1 tablet (600 mg total) by mouth every 6 (six) hours as needed. 30 tablet 0  . Lancets (ONETOUCH ULTRASOFT) lancets PRN 100 each 3  . lovastatin (MEVACOR) 20 MG tablet take 1 tablet by mouth at bedtime 90 tablet 3  . mometasone (ELOCON) 0.1 % ointment     . Multiple Vitamins-Minerals (CENTRUM SILVER PO) Take 1 tablet by mouth every morning.    . naproxen (NAPROSYN) 250 MG tablet Take 1 tablet (250 mg total) by mouth 3 (three) times daily with meals. As needed for pain. 30 tablet 0  . pantoprazole (PROTONIX) 40 MG tablet TAKE 1 TABLET TWICE DAILY BEFORE MEALS 180 tablet 3  . potassium  chloride SA (K-DUR,KLOR-CON) 20 MEQ tablet TAKE 1 TABLET (20 MEQ TOTAL) BY MOUTH DAILY. 90 tablet 3  . Respiratory Therapy Supplies (FLUTTER) DEVI Use as directed 1 each 0  . sertraline (ZOLOFT) 100 MG tablet TAKE 1 TABLET EVERY MORNING 90 tablet 1  . valsartan-hydrochlorothiazide (DIOVAN HCT) 160-12.5 MG tablet Take 1 tablet by mouth daily. 90 tablet 1  . VENTOLIN HFA 108 (90 Base) MCG/ACT inhaler inhale 2 puffs by mouth every 6 hours if needed for wheezing 18 Inhaler 3   No current facility-administered medications on file prior to visit.     Past Medical History:  Diagnosis Date  . ALLERGIC RHINITIS   . Allergy   . Anxiety state, unspecified   . Arthritis   . BRONCHITIS, CHRONIC   . DEPRESSION   . DIABETES MELLITUS, TYPE II   . GERD   . HYPERLIPIDEMIA   . HYPERTENSION   . Psoriasis     Past Surgical History:  Procedure Laterality Date  . ABDOMINAL HYSTERECTOMY  1980  . TRIGGER FINGER RELEASE     Index finger    Social History   Social History  . Marital status: Married    Spouse name: N/A  . Number of children: 0  . Years of education: 65yr colge   Occupational History  . Retired     Social History Main Topics  . Smoking status: Never Smoker  . Smokeless tobacco: Never Used  . Alcohol use No  . Drug use: No  . Sexual activity: Not Asked   Other Topics Concern  . None   Social History Narrative   Married, lives with spouse-retired from [employer] insurance   Not employed    Drinks coffee occasional, Consumes 1 soda a day     Family History  Problem Relation Age of Onset  . Stroke Mother   . Angina Mother   . Diabetes Father   . Hyperlipidemia Other     Parent  . Hypertension Other     Parent  . Diabetes Sister     x1  . CAD Sister   . Diabetes Brother     x 2  . Colon cancer Neg Hx   . Esophageal cancer Neg Hx   . Rectal cancer Neg Hx   . Stomach cancer Neg Hx   . Pancreatic cancer Neg Hx     Review of Systems  Constitutional: Negative for  fever.  Gastrointestinal: Positive for abdominal pain (right lower quadrant). Negative for blood in stool, constipation and diarrhea.       No bowel changes  Genitourinary: Negative for dysuria and hematuria.       No change in urination  Musculoskeletal: Positive for back pain.  Neurological: Negative for numbness.       Objective:  Vitals:   10/09/16 1557  BP: (!) 128/58  Pulse: 98  Resp: 16  Temp: 98.5 F (36.9 C)   Filed Weights   10/09/16 1557  Weight: 166 lb (75.3 kg)   Body mass index is 32.42 kg/m.   Physical Exam  Constitutional: She appears well-developed and well-nourished. No distress.  HENT:  Head: Normocephalic and atraumatic.  Abdominal: Soft. She exhibits no distension (obese) and no mass. There is no tenderness. There is no rebound and no guarding.  Musculoskeletal:  No tenderness with palpation lumbar spine across lower back, no lumbar spine deformity  Neurological:  Normal sensation and strength bilateral lower extremities; positive bilateral leg raise-increased pain across lower back  Skin: Skin is warm and dry. She is not diaphoretic. No erythema.          Assessment & Plan:   See Problem List for Assessment and Plan of chronic medical problems.

## 2016-10-10 ENCOUNTER — Other Ambulatory Visit (INDEPENDENT_AMBULATORY_CARE_PROVIDER_SITE_OTHER): Payer: Medicare Other

## 2016-10-10 ENCOUNTER — Telehealth: Payer: Self-pay | Admitting: Internal Medicine

## 2016-10-10 DIAGNOSIS — R8290 Unspecified abnormal findings in urine: Secondary | ICD-10-CM | POA: Diagnosis not present

## 2016-10-10 DIAGNOSIS — R3129 Other microscopic hematuria: Secondary | ICD-10-CM

## 2016-10-10 DIAGNOSIS — R1031 Right lower quadrant pain: Secondary | ICD-10-CM

## 2016-10-10 LAB — URINALYSIS, ROUTINE W REFLEX MICROSCOPIC
Bilirubin Urine: NEGATIVE
Ketones, ur: NEGATIVE
Leukocytes, UA: NEGATIVE
Nitrite: NEGATIVE
Specific Gravity, Urine: 1.015 (ref 1.000–1.030)
Total Protein, Urine: NEGATIVE
Urine Glucose: NEGATIVE
Urobilinogen, UA: 0.2 (ref 0.0–1.0)
pH: 6 (ref 5.0–8.0)

## 2016-10-10 NOTE — Telephone Encounter (Signed)
Her back xray shows arthritis  - but is limited in what it will show [place].  Her blood shows mild anemia, slightly elevated white blood cell counts indicating a possible infection, slightly low potassium and normal kidney/liver tests.  Her urine shows slight blood and crystals suggestive of possible kidney stones. No urine infection.     If she is still having pain we need to get a ct scan to looks for stones.

## 2016-10-11 ENCOUNTER — Ambulatory Visit: Payer: Medicare Other | Admitting: Internal Medicine

## 2016-10-11 LAB — URINE CULTURE

## 2016-10-11 NOTE — Telephone Encounter (Signed)
Ct ordered

## 2016-10-11 NOTE — Telephone Encounter (Signed)
Spoke with pt to inform. She is okay with having the orders placed for a CT scan.

## 2016-10-14 ENCOUNTER — Ambulatory Visit (INDEPENDENT_AMBULATORY_CARE_PROVIDER_SITE_OTHER)
Admission: RE | Admit: 2016-10-14 | Discharge: 2016-10-14 | Disposition: A | Discharge status: Home / Self Care | Payer: Medicare Other | Source: Ambulatory Visit | Attending: Internal Medicine | Admitting: Internal Medicine

## 2016-10-14 ENCOUNTER — Telehealth: Payer: Self-pay | Admitting: Internal Medicine

## 2016-10-14 DIAGNOSIS — R1031 Right lower quadrant pain: Secondary | ICD-10-CM

## 2016-10-14 DIAGNOSIS — R3129 Other microscopic hematuria: Secondary | ICD-10-CM | POA: Diagnosis not present

## 2016-10-14 NOTE — Telephone Encounter (Signed)
Patient is requesting call back with lab results and also states she was called to set a CT scan up but does not know why.

## 2016-10-16 NOTE — Telephone Encounter (Signed)
Spoke with pt to inform.  

## 2016-10-22 ENCOUNTER — Other Ambulatory Visit: Payer: Self-pay | Admitting: Internal Medicine

## 2016-10-22 ENCOUNTER — Telehealth: Payer: Self-pay | Admitting: Internal Medicine

## 2016-10-22 NOTE — Telephone Encounter (Signed)
Her kidney function is normal.  She should come back in if she is still having pain

## 2016-10-22 NOTE — Telephone Encounter (Signed)
Pt called in and said that she needs to speak with a nurse about a few test that she has just had done

## 2016-10-22 NOTE — Telephone Encounter (Signed)
Pt called and is complaining of constant pain in her left thigh, she would like to know if there is anything wrong with her kidneys. I have spoke with pt and giving results and she has also seen MyChart. Im unsure that she is remembering these conversations. Please advise.

## 2016-10-23 NOTE — Telephone Encounter (Signed)
Spoke with pt, she is still having the pain. Have scheduled appt for appt with Dr Quay Burow on Friday.

## 2016-10-25 ENCOUNTER — Ambulatory Visit: Payer: Medicare Other | Admitting: Internal Medicine

## 2016-10-28 ENCOUNTER — Other Ambulatory Visit: Payer: Self-pay | Admitting: Internal Medicine

## 2016-11-04 ENCOUNTER — Ambulatory Visit (INDEPENDENT_AMBULATORY_CARE_PROVIDER_SITE_OTHER): Payer: Medicare Other | Admitting: Internal Medicine

## 2016-11-04 ENCOUNTER — Encounter: Payer: Self-pay | Admitting: Internal Medicine

## 2016-11-04 VITALS — BP 116/60 | HR 92 | Ht 60.0 in | Wt 166.4 lb

## 2016-11-04 DIAGNOSIS — R058 Other specified cough: Secondary | ICD-10-CM

## 2016-11-04 DIAGNOSIS — R05 Cough: Secondary | ICD-10-CM | POA: Diagnosis not present

## 2016-11-04 DIAGNOSIS — R918 Other nonspecific abnormal finding of lung field: Secondary | ICD-10-CM | POA: Diagnosis not present

## 2016-11-04 NOTE — Progress Notes (Signed)
Subjective:    Patient ID: Heidi Franklin, female   DOB: 29-Jan-1946    MRN: [id number]  Brief patient profile:  71  yobf never smoker with chronic cough  since age 71's only resolves for a week or two  at a time then recurs daily,  dx as  post-nasal drip with nasal septal deviation, and GERD with neg MCT 11/2013   Workup for cough : CT sinus 11/08/11>>Negative paranasal sinuses.  Rightward nasal septal deviation and possible rhinitis. 01/13/12>>  RAST negative, IgE 22.7 PFT 11/07/11>>FEV1 1.79 (102%),  Ratio 82, TLC 4.18 (104%), DLCO 69%, positive BD only in terms of fef 25/75 -med calendar 11/23/2012 , 03/18/2013 , 08/26/2013 , 12/06/2013, 06/21/2014 > not keeping up with it  -methacholine challenge test  12/24/13 > neg  - try off singulair 12/31/2013 > no worse  - added neurontin 100 mg qid  05/20/14 but did not take consistently as of 06/30/2014 > increased to 100 qid 08/23/2014 improved then stopped it and flared spirng of 2016  - 07/05/2015 increase neurontin 300 tid and added elavil 10 mg at hs > not clear she did this at f/u ov 08/03/2015  - added flutter valve 08/03/2015  -08/2015 neurontin /tramadol stopped due to falls.      History of Present Illness  08/03/2012 f/u ov/Heidi Franklin cc cough for decades, better on present rx than for many years prior to pulmonary clinic changes, not using calendar, poor hfa  Technique but no limiting sob.  rec Change zyrtec to where you take it only if needed for itching/ sneezing  Work on inhaler technique:      Ok to use symbicort with spacer if not improving     12/06/2013 NP Follow up and Med review  Patient returns for a followup visit and medication review. We reviewed all her medications organized them into a medication calendar with patient education. Appears the patient is taking her medications correctly. Since last visit. Patient reports that she is doing better. Cough is not as bad. Last ov Symbicort was changed to As needed  Use only.  She is using on average  every other day. Not sure if she needs or not.  No fever, chest pain , edema or wheezing. No xopenex use since last ov.  rec MCT > neg > d/c symbicort       12/04/2015  f/u ov/Heidi Franklin re:  Cough x 50 years  Chief Complaint  Patient presents with  . Follow-up    Cough has improved some. No new co's today.   chlorpheniramine best way to stop the tickle  Only uses it maybe once a day  Very rarely use saba now >chlor tabs   12/15/2015 Acute OV  Pt presents for an acute office visit. Complains of chest tightness, sinus drainage, SOB, dry cough starting on 12/09/15. Denies any chest congestion, sinus pressure, fever, nausea or vomiting. Currently taking a zpak and pred taper given by MW on 12/12/15. CXR today shows bronchitic changes  Says she ran out of hydromet , helped some. Wants refill.  Denies chest pain, orthopnea, edema or fever.  rec Finish Prednisone and Zpack .  Delsym 2 tsp Twice daily  As needed  Cough  Chlortrimeton 4mg  1 every 4hrs as needed for drainage.  Mucinex Twice daily  As needed  Congestion .  Hydromet 1 tsp every 6hr as needed. - may make you sleepy.  Please contact office for sooner follow up if symptoms do not improve or worsen or seek emergency  care    NP acute ov  02/05/16 We will call in a prednisone taper Prednisone taper; 10 mg tablets: 4 tabs x 2 days, 3 tabs x 2 days, 2 tabs x 2 days 1 tab x 2 days then stop. Zpack Use the hydromet cough syrup 1 teaspoon for sleep at night every 4-6 hours.   02/23/2016  f/u ov/Heidi Franklin re: cough x 50 y c/w uacs/  Neg MCT   Chief Complaint  Patient presents with  . Follow-up    Breathing has returned to baseline. She is coughing much less. She has not needed albuterol.   wakes up once or twice a twice weekly coughing but not needing saba at all  Still doe= MMRC2 = can't walk a nl pace on a flat grade s sob but does fine slow and flat eg [employer] shopping  >>no change   06/11/2016 NP Follow-up Chronic Cough /Neg MCT  Patient  presents for a four-month follow-up and medication review. She says that she/he been doing well. She's had no flare of her cough. rec No change in medications   11/04/2016  f/u ov/Heidi Franklin re: cough x 50 y better on no maint rx  Chief Complaint  Patient presents with  . Follow-up    Breathing is overall doing well. She has occ cough, non prod. She had renal CT done 10/14/17 that showed MPN. She is using ventolin HFA 1 x per wk on average.      Doe = MMRC1 = can walk nl pace, flat grade, can't hurry or go uphills or steps s sob    No obvious day to day or daytime variability or assoc excess/ purulent sputum or mucus plugs or hemoptysis or cp or chest tightness, subjective wheeze or overt sinus or hb symptoms. No unusual exp hx or h/o childhood pna/ asthma or knowledge of premature birth.  Sleeping ok without nocturnal  or early am exacerbation  of respiratory  c/o's or need for noct saba. Also denies any obvious fluctuation of symptoms with weather or environmental changes or other aggravating or alleviating factors except as outlined above   Current Medications, Allergies, Complete Past Medical History, Past Surgical History, Family History, and Social History were reviewed in [employer] record.  ROS  The following are not active complaints unless bolded sore throat, dysphagia, dental problems, itching, sneezing,  nasal congestion or excess/ purulent secretions, ear ache,   fever, chills, sweats, unintended wt loss, classically pleuritic or exertional cp,  orthopnea pnd or leg swelling, presyncope, palpitations, abdominal pain, anorexia, nausea, vomiting, diarrhea  or change in bowel or bladder habits, change in stools or urine, dysuria,hematuria,  rash, arthralgias, visual complaints, headache, numbness, weakness or ataxia or problems with walking or coordination,  change in mood/affect or memory.              Objective:   Physical Exam       Exam:  GEN: A/Ox3;  pleasant , NAD,  amb hoarse  bf nad / [place]ow[place]ze     . 05/20/2014       168   >168 06/21/2014 >  06/30/2014  168 > 08/23/2014  170 >183 03/20/2015 > 04/10/2015 180 >181 05/02/2015 >  07/05/2015 177  > 08/03/2015 171 > 12/04/2015  166 >165 12/15/2015 > 02/23/2016  163>  11/04/2016  166   Vital signs reviewed  - Note on arrival 02 sats  97% on  RA     HEENT:  [place]/AT,  EACs    ,  NOSE-mild  bilateral sym non-specific turbinate edema    THROAT- minimal cobblestoning, clear pnd, no  exudate noted.   NECK:  Supple w/ fair ROM; no JVD; normal carotid impulses w/o bruits; no thyromegaly or nodules palpated; no lymphadenopathy.    RESP  Clear to A and P with no accessory muscle use, no dullness to percussion,    CARD:  RRR, no m/r/g  , no peripheral edema, pulses intact, no cyanosis or clubbing.  GI:   Obese/soft & nt; nml bowel sounds; no organomegaly or masses detected.   Musco: Warm bil, no deformities or joint swelling noted.   Neuro: alert, no focal deficits noted.    Skin: Warm, no lesions or rashes      I personally reviewed images and agree with radiology impression as follows:  CT Renal  10/14/16  x 3 nodules all < 6 mm      Outpatient Encounter Prescriptions as of 11/04/2016  Medication Sig  . acetaminophen (TYLENOL) 325 MG tablet Take 650 mg by mouth every 6 (six) hours as needed.  . ALPRAZolam (XANAX) 0.5 MG tablet Take 1 tablet by mouth every 8 hours if needed for anxiety.  Marland Kitchen amLODipine (NORVASC) 10 MG tablet TAKE 1 TABLET EVERY DAY  . aspirin 81 MG tablet Take 81 mg by mouth every morning.   . Calcium Citrate-Vitamin D (CALCIUM + D PO) Take 1 tablet by mouth every morning.  . chlorpheniramine (CHLOR-TRIMETON) 4 MG tablet Take 2 tabs by mouth at bedtime and every 4 hours as needed for drainage/drippy nose/sneezing  . famotidine (PEPCID) 20 MG tablet Take 1 tablet (20 mg total) by mouth at bedtime.  . fluocinonide ointment (LIDEX) [id number] % Apply 1 application topically as needed.   .  fluticasone (FLONASE) 50 MCG/ACT nasal spray USE 1 SPRAY IN EACH NOSTRIL TWICE DAILY  . glucose blood (ONE TOUCH ULTRA TEST) test strip 1 each by Other route daily. Use 1 strips to check blood sugar twice a day Ex E11.9  . glyBURIDE (DIABETA) 2.5 MG tablet Take 1 tablet (2.5 mg total) by mouth daily with breakfast.  . ibuprofen (ADVIL,MOTRIN) 600 MG tablet Take 1 tablet (600 mg total) by mouth every 6 (six) hours as needed.  . Lancets (ONETOUCH ULTRASOFT) lancets PRN  . lovastatin (MEVACOR) 20 MG tablet take 1 tablet by mouth at bedtime  . mometasone (ELOCON) 0.1 % ointment   . Multiple Vitamins-Minerals (CENTRUM SILVER PO) Take 1 tablet by mouth every morning.  . naproxen (NAPROSYN) 250 MG tablet Take 1 tablet (250 mg total) by mouth 3 (three) times daily with meals. As needed for pain.  . pantoprazole (PROTONIX) 40 MG tablet TAKE 1 TABLET TWICE DAILY BEFORE MEALS  . potassium chloride SA (K-DUR,KLOR-CON) 20 MEQ tablet TAKE 1 TABLET (20 MEQ TOTAL) BY MOUTH DAILY.  Marland Kitchen Respiratory Therapy Supplies (FLUTTER) DEVI Use as directed  . sertraline (ZOLOFT) 100 MG tablet TAKE 1 TABLET EVERY MORNING  . valsartan-hydrochlorothiazide (DIOVAN HCT) 160-12.5 MG tablet Take 1 tablet by mouth daily.  . VENTOLIN HFA 108 (90 Base) MCG/ACT inhaler inhale 2 puffs by mouth every 6 hours if needed for wheezing  . [DISCONTINUED] cyclobenzaprine (FLEXERIL) 5 MG tablet Take 1 every 12 hours as needed for muscle relaxant. Caution: May cause drowsiness (Patient not taking: Reported on 11/04/2016)  . [DISCONTINUED] glyBURIDE (DIABETA) 2.5 MG tablet take 1 tablet by mouth every morning WITH BREAKFAST  . [DISCONTINUED] lovastatin (MEVACOR) 20 MG tablet take 1 tablet by mouth at bedtime  No facility-administered encounter medications on file as of 11/04/2016.

## 2016-11-04 NOTE — Patient Instructions (Signed)
See calendar for specific medication instructions and bring it back for each and every office visit for every healthcare provider you see.  Without it,  you may not receive the best quality medical care that we feel you deserve.  You will note that the calendar groups together  your maintenance  medications that are timed at particular times of the day.  Think of this as your checklist for what your doctor has instructed you to do until your next evaluation to see what benefit  there is  to staying on a consistent group of medications intended to keep you well.  The other group at the bottom is entirely up to you to use as you see fit  for specific symptoms that may arise between visits that require you to treat them on an as needed basis.  Think of this as your action plan or "what if" list.   Separating the top medications from the bottom group is fundamental to providing you adequate care going forward.    Let me know if you would like to see the throat specialists at [facility]  Pulmonary follow up is as needed for flare of cough

## 2016-11-10 DIAGNOSIS — R918 Other nonspecific abnormal finding of lung field: Secondary | ICD-10-CM | POA: Insufficient documentation

## 2016-11-10 NOTE — Assessment & Plan Note (Signed)
CT results reviewed with pt >>> Too small for PET or bx, not suspicious enough for excisional bx > really only option for now is follow the Fleischner society guidelines as rec by radiology > never smoker and < 7 mm so no dedicated f/u indicated  Discussed in detail all the  indications, usual  risks and alternatives  relative to the benefits with patient who agrees to proceed with conservative f/u as outlined  - f/u prn symptoms

## 2016-11-10 NOTE — Assessment & Plan Note (Signed)
CT sinus 11/08/11>>Negative paranasal sinuses.  Rightward nasal septal deviation and possible rhinitis. 01/13/12>>  RAST negative, IgE 22.7 PFT 11/07/11>>FEV1 1.79 (102%),  Ratio 82, TLC 4.18 (104%), DLCO 69%, positive BD only in terms of fef 25/75 -med calendar 11/23/2012 , 03/18/2013 , 08/26/2013 , 12/06/2013, 06/21/2014 > not keeping up with it ,redone  06/11/2016  -methacholine challenge test  12/24/13 > neg  - try off singulair 12/31/2013 > no worse  - added neurontin 100 mg qid  05/20/14 but did not take consistently as of 06/30/2014 > increased to 100 qid 08/23/2014 improved then stopped it and flared spirng of 2016  - 07/05/2015 increase neurontin 300 tid and added elavil 10 mg at hs > not clear she did this at f/u ov 08/03/2015  - added flutter valve 08/03/2015  - neurontin/ultram /elavil stopped 07/2015 due to falls > no worse 12/04/2015    It does appear the cough was better while on max rx for irritable larynx syndrome and she's put up with her present level of cough x 50 years - no other options in this clinic to offer at this point > refer to [facility] pr[facility]   I had an extended discussion with the patient reviewing all relevant studies completed to date and  lasting 15 to 20 minutes of a 25 minute visit    Each maintenance medication was reviewed in detail including most importantly the difference between maintenance and prns and under what circumstances the prns are to be triggered using an action plan format that is not reflected in the computer generated alphabetically organized AVS but trather by a customized med calendar that reflects the AVS meds with confirmed 100% correlation.   In addition, Please see AVS for unique instructions that I personally wrote and verbalized to the the pt in detail and then reviewed with pt  by my nurse highlighting any  changes in therapy recommended at today's visit to their plan of care.

## 2016-11-10 NOTE — Assessment & Plan Note (Signed)
Body mass index is 32.5 trending back up.  Lab Results  Component Value Date   TSH 3.90 08/08/2016     Contributing to gerd tendency/ doe/reviewed the need and the process to achieve and maintain neg calorie balance > defer f/u primary care including intermittently monitoring thyroid status

## 2016-11-11 NOTE — Progress Notes (Signed)
HPI: FU question syncope. Echocardiogram November 2017 Showed normal LV function, grade 1 diastolic dysfunction. Nuclear study November 2017 showed ejection fraction 65% and normal perfusion. Monitor January 2018 showed sinus rhythm. Since last seen, patient has mild dyspnea on exertion but no orthopnea, PND or pedal edema. She denies chest pain. She has fallen twice but denies frank syncope. These episodes are not clearly related to changing positions. No associated chest pain, dyspnea or palpitations. No incontinence.  Current Outpatient Prescriptions  Medication Sig Dispense Refill  . acetaminophen (TYLENOL) 325 MG tablet Take 650 mg by mouth every 6 (six) hours as needed.    . ALPRAZolam (XANAX) 0.5 MG tablet Take 1 tablet by mouth every 8 hours if needed for anxiety. 30 tablet 2  . amLODipine (NORVASC) 10 MG tablet TAKE 1 TABLET EVERY DAY 90 tablet 3  . aspirin 81 MG tablet Take 81 mg by mouth every morning.     . Calcium Citrate-Vitamin D (CALCIUM + D PO) Take 1 tablet by mouth every morning.    . chlorpheniramine (CHLOR-TRIMETON) 4 MG tablet Take 2 tabs by mouth at bedtime and every 4 hours as needed for drainage/drippy nose/sneezing    . famotidine (PEPCID) 20 MG tablet Take 1 tablet (20 mg total) by mouth at bedtime. 30 tablet 11  . fluocinonide ointment (LIDEX) [id number] % Apply 1 application topically as needed.     . fluticasone (FLONASE) 50 MCG/ACT nasal spray USE 1 SPRAY IN EACH NOSTRIL TWICE DAILY 48 g 1  . glucose blood (ONE TOUCH ULTRA TEST) test strip 1 each by Other route daily. Use 1 strips to check blood sugar twice a day Ex E11.9 100 each 3  . glyBURIDE (DIABETA) 2.5 MG tablet Take 1 tablet (2.5 mg total) by mouth daily with breakfast. 30 tablet 5  . ibuprofen (ADVIL,MOTRIN) 600 MG tablet Take 1 tablet (600 mg total) by mouth every 6 (six) hours as needed. 30 tablet 0  . Lancets (ONETOUCH ULTRASOFT) lancets PRN 100 each 3  . lovastatin (MEVACOR) 20 MG tablet take 1 tablet  by mouth at bedtime 90 tablet 3  . mometasone (ELOCON) 0.1 % ointment     . Multiple Vitamins-Minerals (CENTRUM SILVER PO) Take 1 tablet by mouth every morning.    . naproxen (NAPROSYN) 250 MG tablet Take 1 tablet (250 mg total) by mouth 3 (three) times daily with meals. As needed for pain. 30 tablet 0  . pantoprazole (PROTONIX) 40 MG tablet TAKE 1 TABLET TWICE DAILY BEFORE MEALS 180 tablet 3  . potassium chloride SA (K-DUR,KLOR-CON) 20 MEQ tablet TAKE 1 TABLET (20 MEQ TOTAL) BY MOUTH DAILY. 90 tablet 3  . Respiratory Therapy Supplies (FLUTTER) DEVI Use as directed 1 each 0  . sertraline (ZOLOFT) 100 MG tablet TAKE 1 TABLET EVERY MORNING 90 tablet 1  . valsartan-hydrochlorothiazide (DIOVAN HCT) 160-12.5 MG tablet Take 1 tablet by mouth daily. 90 tablet 1  . VENTOLIN HFA 108 (90 Base) MCG/ACT inhaler inhale 2 puffs by mouth every 6 hours if needed for wheezing 18 Inhaler 3   No current facility-administered medications for this visit.      Past Medical History:  Diagnosis Date  . ALLERGIC RHINITIS   . Allergy   . Anxiety state, unspecified   . Arthritis   . BRONCHITIS, CHRONIC   . DEPRESSION   . DIABETES MELLITUS, TYPE II   . GERD   . HYPERLIPIDEMIA   . HYPERTENSION   . Psoriasis  Past Surgical History:  Procedure Laterality Date  . ABDOMINAL HYSTERECTOMY  1980  . TRIGGER FINGER RELEASE     Index finger    Social History   Social History  . Marital status: Married    Spouse name: N/A  . Number of children: 0  . Years of education: 66yr colge   Occupational History  . Retired     Social History Main Topics  . Smoking status: Never Smoker  . Smokeless tobacco: Never Used  . Alcohol use No  . Drug use: No  . Sexual activity: Not on file   Other Topics Concern  . 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 Stroke Mother   . Angina Mother   . Diabetes Father   . Hyperlipidemia Other     Parent  . Hypertension Other     Parent  . Diabetes Sister     x1  . CAD Sister   . Diabetes Brother     x 2  . Colon cancer Neg Hx   . Esophageal cancer Neg Hx   . Rectal cancer Neg Hx   . Stomach cancer Neg Hx   . Pancreatic cancer Neg Hx     ROS: no fevers or chills, productive cough, hemoptysis, dysphasia, odynophagia, melena, hematochezia, dysuria, hematuria, rash, seizure activity, orthopnea, PND, pedal edema, claudication. Remaining systems are negative.  Physical Exam: Well-developed mildly obese in no acute distress.  Skin is warm and dry.  HEENT is normal.  Neck is supple.  Chest is clear to auscultation with normal expansion.  Cardiovascular exam is regular rate and rhythm.  Abdominal exam nontender or distended. No masses palpated. Extremities show no edema. neuro grossly intact   A/P  1 syncope-question falls versus syncope. LV function is normal and no ischemia on nuclear study. She did wear a monitor but had no falls with a monitor in place. She denies frank loss of consciousness with these episodes. Question neurologic. We could consider an implantable loop in the future if we felt she was having recurrent syncope. She might need a neurology evaluation. Will leave to primary care. I have asked her to carry a cane that might assist her and prevent injury with falling. Also note symptoms are not clearly orthostatic.  2 dyspnea-patient had some dyspnea and chest tightness with climbing hills which has resolved. Nuclear study showed no ischemia. Will continue medical therapy.  3 hypertension-blood pressure controlled. Continue present medications.  4 hyperlipidemia-continue statin.  Kirk Ruths, MD

## 2016-11-20 ENCOUNTER — Encounter: Payer: Self-pay | Admitting: Cardiology

## 2016-11-20 ENCOUNTER — Ambulatory Visit (INDEPENDENT_AMBULATORY_CARE_PROVIDER_SITE_OTHER): Payer: Medicare Other | Admitting: Cardiology

## 2016-11-20 VITALS — BP 113/72 | HR 100 | Ht 60.0 in | Wt 164.4 lb

## 2016-11-20 DIAGNOSIS — I10 Essential (primary) hypertension: Secondary | ICD-10-CM | POA: Diagnosis not present

## 2016-11-20 DIAGNOSIS — R55 Syncope and collapse: Secondary | ICD-10-CM | POA: Diagnosis not present

## 2016-11-20 DIAGNOSIS — R0600 Dyspnea, unspecified: Secondary | ICD-10-CM

## 2016-11-20 NOTE — Patient Instructions (Signed)
Your physician wants you to follow-up in: 6 MONTHS WITH DR CRENSHAW You will receive a reminder letter in the mail two months in advance. If you don't receive a letter, please call our office to schedule the follow-up appointment.   If you need a refill on your cardiac medications before your next appointment, please call your pharmacy.  

## 2016-11-23 ENCOUNTER — Encounter: Payer: Self-pay | Admitting: Cardiology

## 2016-11-23 NOTE — Progress Notes (Signed)
I have reviewed pts ECG with Dr Caryl Comes; QT appears to be normal; given recurrent falls with unclear etiology and recent negative monitor (with no events while wearing) need ILR for more prolonged monitoring (? Arrhythmia causing falls/syncope). Will ask Fredia Beets to arrange procedure when Dr Caryl Comes is in hospital. Kirk Ruths, MD

## 2016-11-26 DIAGNOSIS — Z1231 Encounter for screening mammogram for malignant neoplasm of breast: Secondary | ICD-10-CM | POA: Diagnosis not present

## 2016-11-26 LAB — HM MAMMOGRAPHY

## 2016-11-28 NOTE — Telephone Encounter (Signed)
Close encounter 

## 2016-11-30 ENCOUNTER — Encounter: Payer: Self-pay | Admitting: Internal Medicine

## 2016-12-04 ENCOUNTER — Telehealth: Payer: Self-pay | Admitting: Internal Medicine

## 2016-12-04 NOTE — Telephone Encounter (Signed)
I called and spoke with the patient about arranging an Implantable loop recorder per Dr. Stanford Breed. I discussed the procedure with her in detail, however, she states "this is taking me a little off guard." She states she would like to discuss further with Dr. Stanford Breed prior to proceeding with implant. I advised I would forward the message to Dr. Stanford Breed and Hilda Blades, RN so that Dr. Stanford Breed may discuss further with her. She is aware that if she would like to pursue implant after discussion with Dr. Stanford Breed, then I will call her back to arrange. She is agreeable.

## 2016-12-04 NOTE — Telephone Encounter (Signed)
Spoke with pt, Follow up scheduled  

## 2016-12-04 NOTE — Telephone Encounter (Signed)
Schedule fuov Heidi Franklin

## 2016-12-04 NOTE — Telephone Encounter (Signed)
Staff message received from Fredia Beets, RN for Dr. Stanford Breed, to please arrange for the patient to have loop recorder placed by Dr. Caryl Comes.  Per Dr. Stanford Breed: Progress Notes by Lelon Perla, MD at 11/23/2016 9:11 AM   Author: Lelon Perla, MD Author Type: Physician Filed: 11/23/2016 9:14 AM  Note Status: Signed Cosign: Cosign Not Required Encounter Date: 11/23/2016 9:11 AM  Editor: Lelon Perla, MD (Physician)    I have reviewed pts ECG with Dr Caryl Comes; QT appears to be normal; given recurrent falls with unclear etiology and recent negative monitor (with no events while wearing) need ILR for more prolonged monitoring (? Arrhythmia causing falls/syncope). Will ask Fredia Beets to arrange procedure when Dr Caryl Comes is in hospital. Kirk Ruths, MD       I left a message for the patient to call to arrange at her home & cell #'s.

## 2016-12-16 ENCOUNTER — Encounter: Payer: Self-pay | Admitting: Internal Medicine

## 2016-12-17 ENCOUNTER — Other Ambulatory Visit: Payer: Self-pay | Admitting: Emergency Medicine

## 2016-12-17 MED ORDER — LOVASTATIN 20 MG PO TABS: 20.0000 mg | ORAL_TABLET | Freq: Every day | ORAL | 1 refills | Status: DC

## 2016-12-17 MED ORDER — GLYBURIDE 2.5 MG PO TABS: 2.5000 mg | ORAL_TABLET | Freq: Every day | ORAL | 1 refills | Status: DC

## 2016-12-17 MED ORDER — VALSARTAN-HYDROCHLOROTHIAZIDE 160-12.5 MG PO TABS: 1.0000 | ORAL_TABLET | Freq: Every day | ORAL | 1 refills | Status: DC

## 2016-12-23 ENCOUNTER — Other Ambulatory Visit: Payer: Self-pay | Admitting: Emergency Medicine

## 2016-12-23 MED ORDER — OMEPRAZOLE 40 MG PO CPDR: 40.0000 mg | DELAYED_RELEASE_CAPSULE | Freq: Every day | ORAL | 1 refills | Status: DC

## 2016-12-23 NOTE — Telephone Encounter (Signed)
Ok to fill 

## 2016-12-24 ENCOUNTER — Telehealth: Payer: Self-pay | Admitting: Cardiology

## 2016-12-24 NOTE — Telephone Encounter (Signed)
New message   Pt calling to ask if she should come see Dr. Stanford Breed if she is not wanting to do a device right now.

## 2016-12-24 NOTE — Telephone Encounter (Signed)
Pt of Dr. Stanford Breed  Returned call. Patient notes she'd worn an event monitor - findings unremarkable.  (was for ?syncope) Since she was still having symptoms, plan was to discuss putting loop recorder in. She had been very nervous about this.  She decided to cancel a f/u w Dr. Stanford Breed tomorrow to discuss possibility of loop recorder implantation, as she still feels very hesitant about even discussing it. She did agree that if she had symptoms recur, to call and we could arrange visit at her request. Pt declined further f/u at this time.

## 2016-12-25 ENCOUNTER — Ambulatory Visit: Payer: Medicare Other | Admitting: Cardiology

## 2016-12-25 NOTE — Telephone Encounter (Signed)
Dr crenshaw aware 

## 2017-01-02 ENCOUNTER — Encounter: Payer: Self-pay | Admitting: Family

## 2017-01-02 ENCOUNTER — Telehealth: Payer: Self-pay | Admitting: Internal Medicine

## 2017-01-02 ENCOUNTER — Ambulatory Visit (INDEPENDENT_AMBULATORY_CARE_PROVIDER_SITE_OTHER): Payer: Medicare Other | Admitting: Family

## 2017-01-02 VITALS — BP 134/64 | HR 97 | Temp 100.0°F | Resp 16 | Ht 60.0 in | Wt 165.8 lb

## 2017-01-02 DIAGNOSIS — R05 Cough: Secondary | ICD-10-CM | POA: Diagnosis not present

## 2017-01-02 DIAGNOSIS — R059 Cough, unspecified: Secondary | ICD-10-CM | POA: Insufficient documentation

## 2017-01-02 LAB — POCT EXHALED NITRIC OXIDE: FeNO level (ppb): 11

## 2017-01-02 MED ORDER — HYDROCODONE-HOMATROPINE 5-1.5 MG/5ML PO SYRP: 5.0000 mL | ORAL_SOLUTION | Freq: Four times a day (QID) | ORAL | 0 refills | Status: DC | PRN

## 2017-01-02 MED ORDER — LEVOFLOXACIN 500 MG PO TABS: 500.0000 mg | ORAL_TABLET | Freq: Every day | ORAL | 0 refills | Status: DC

## 2017-01-02 NOTE — Progress Notes (Signed)
Subjective:    Patient ID: Heidi Franklin, female    DOB: [birth year], 71 y.o.   MRN: [id number]  Chief Complaint  Patient presents with  . Cough    sore throat and cough, x3 days    HPI:  Heidi Franklin is a 71 y.o. female who  has a past medical history of ALLERGIC RHINITIS; Allergy; Anxiety state, unspecified; Arthritis; BRONCHITIS, CHRONIC; DEPRESSION; DIABETES MELLITUS, TYPE II; GERD; HYPERLIPIDEMIA; HYPERTENSION; and Psoriasis. and presents today for an acute office visit.  This is a new problem. Associated symptoms of sore throat, cough, and congestion that has been going on for about 3 days. Occasional fever. Modifying factors include prescription cough syrup and Mucinex which has helped a little. Course of the symptoms may have some improvement in the last 24 hours. No recent antibiotics.   Allergies  Allergen Reactions  . Metformin And Related Diarrhea  . Penicillins Rash      Outpatient Medications Prior to Visit  Medication Sig Dispense Refill  . acetaminophen (TYLENOL) 325 MG tablet Take 650 mg by mouth every 6 (six) hours as needed.    . ALPRAZolam (XANAX) 0.5 MG tablet Take 1 tablet by mouth every 8 hours if needed for anxiety. 30 tablet 2  . amLODipine (NORVASC) 10 MG tablet TAKE 1 TABLET EVERY DAY 90 tablet 3  . aspirin 81 MG tablet Take 81 mg by mouth every morning.     . Calcium Citrate-Vitamin D (CALCIUM + D PO) Take 1 tablet by mouth every morning.    . chlorpheniramine (CHLOR-TRIMETON) 4 MG tablet Take 2 tabs by mouth at bedtime and every 4 hours as needed for drainage/drippy nose/sneezing    . famotidine (PEPCID) 20 MG tablet Take 1 tablet (20 mg total) by mouth at bedtime. 30 tablet 11  . fluocinonide ointment (LIDEX) 5.88 % Apply 1 application topically as needed.     . fluticasone (FLONASE) 50 MCG/ACT nasal spray USE 1 SPRAY IN EACH NOSTRIL TWICE DAILY 48 g 1  . glucose blood (ONE TOUCH ULTRA TEST) test strip 1 each by Other route daily. Use 1 strips to check  blood sugar twice a day Ex E11.9 100 each 3  . glyBURIDE (DIABETA) 2.5 MG tablet Take 1 tablet (2.5 mg total) by mouth daily with breakfast. 90 tablet 1  . ibuprofen (ADVIL,MOTRIN) 600 MG tablet Take 1 tablet (600 mg total) by mouth every 6 (six) hours as needed. 30 tablet 0  . Lancets (ONETOUCH ULTRASOFT) lancets PRN 100 each 3  . lovastatin (MEVACOR) 20 MG tablet Take 1 tablet (20 mg total) by mouth at bedtime. 90 tablet 1  . mometasone (ELOCON) 0.1 % ointment     . Multiple Vitamins-Minerals (CENTRUM SILVER PO) Take 1 tablet by mouth every morning.    . naproxen (NAPROSYN) 250 MG tablet Take 1 tablet (250 mg total) by mouth 3 (three) times daily with meals. As needed for pain. 30 tablet 0  . omeprazole (PRILOSEC) 40 MG capsule Take 1 capsule (40 mg total) by mouth daily. 90 capsule 1  . pantoprazole (PROTONIX) 40 MG tablet TAKE 1 TABLET TWICE DAILY BEFORE MEALS 180 tablet 3  . potassium chloride SA (K-DUR,KLOR-CON) 20 MEQ tablet TAKE 1 TABLET (20 MEQ TOTAL) BY MOUTH DAILY. 90 tablet 3  . Respiratory Therapy Supplies (FLUTTER) DEVI Use as directed 1 each 0  . sertraline (ZOLOFT) 100 MG tablet TAKE 1 TABLET EVERY MORNING 90 tablet 1  . valsartan-hydrochlorothiazide (DIOVAN HCT) 160-12.5 MG tablet Take 1 tablet  by mouth daily. 90 tablet 1  . VENTOLIN HFA 108 (90 Base) MCG/ACT inhaler inhale 2 puffs by mouth every 6 hours if needed for wheezing 18 Inhaler 3   No facility-administered medications prior to visit.      Review of Systems  Constitutional: Positive for chills and fever.  HENT: Positive for congestion.   Respiratory: Positive for cough. Negative for chest tightness and shortness of breath.       Objective:    BP 134/64 (BP Location: Left Arm, Patient Position: Sitting, Cuff Size: Normal)   Pulse 97   Temp 100 F (37.8 C) (Oral)   Resp 16   Ht 5' (1.524 m)   Wt 165 lb 12.8 oz (75.2 kg)   SpO2 97%   BMI 32.38 kg/m  Nursing note and vital signs reviewed.  FeNo Test -  11  Physical Exam  Constitutional: She is oriented to person, place, and time. She appears well-developed and well-nourished. No distress.  HENT:  Right Ear: Hearing, external ear and ear canal normal.  Left Ear: Hearing, tympanic membrane, external ear and ear canal normal.  Nose: Right sinus exhibits maxillary sinus tenderness. Right sinus exhibits no frontal sinus tenderness. Left sinus exhibits maxillary sinus tenderness. Left sinus exhibits no frontal sinus tenderness.  Mouth/Throat: Uvula is midline, oropharynx is clear and moist and mucous membranes are normal.  Increased cerumen of bilateral ears greater on the right.   Cardiovascular: Normal rate, regular rhythm, normal heart sounds and intact distal pulses.   Pulmonary/Chest: Effort normal. No respiratory distress. She has wheezes. She has no rales. She exhibits no tenderness.  Neurological: She is alert and oriented to person, place, and time.  Skin: Skin is warm and dry.  Psychiatric: She has a normal mood and affect. Her behavior is normal. Judgment and thought content normal.       Assessment & Plan:   Problem List Items Addressed This Visit      Other   Cough - Primary    Symptoms and exam consistent with acute upper respiratory infection with concern for developing bronchitis. Given symptom improvement written prescription for levofloxacin provided if symptoms worsen or do not improve in the next 3-5 days. Refill Hycodan. Continue over-the-counter medications as needed for symptom relief and supportive care. Follow-up if symptoms worsen or do not improve.      Relevant Orders   POCT EXHALED NITRIC OXIDE (Completed)       I am having Ms. Postel start on levofloxacin and HYDROcodone-homatropine. I am also having her maintain her aspirin, chlorpheniramine, Multiple Vitamins-Minerals (CENTRUM SILVER PO), famotidine, FLUTTER, Calcium Citrate-Vitamin D (CALCIUM + D PO), glucose blood, onetouch ultrasoft, ALPRAZolam, potassium  chloride SA, amLODipine, fluocinonide ointment, mometasone, acetaminophen, VENTOLIN HFA, ibuprofen, fluticasone, sertraline, pantoprazole, naproxen, valsartan-hydrochlorothiazide, lovastatin, glyBURIDE, and omeprazole.   Meds ordered this encounter  Medications  . levofloxacin (LEVAQUIN) 500 MG tablet    Sig: Take 1 tablet (500 mg total) by mouth daily.    Dispense:  7 tablet    Refill:  0    Order Specific Question:   Supervising Provider    Answer:   Pricilla Holm A [[id number]]  . HYDROcodone-homatropine (HYCODAN) 5-1.5 MG/5ML syrup    Sig: Take 5 mLs by mouth every 6 (six) hours as needed for cough.    Dispense:  180 mL    Refill:  0    Order Specific Question:   Supervising Provider    Answer:   Pricilla Holm A [[id number]]  Follow-up: Return if symptoms worsen or fail to improve.  Mauricio Po, FNP

## 2017-01-02 NOTE — Assessment & Plan Note (Signed)
Symptoms and exam consistent with acute upper respiratory infection with concern for developing bronchitis. Given symptom improvement written prescription for levofloxacin provided if symptoms worsen or do not improve in the next 3-5 days. Refill Hycodan. Continue over-the-counter medications as needed for symptom relief and supportive care. Follow-up if symptoms worsen or do not improve.

## 2017-01-02 NOTE — Telephone Encounter (Signed)
Ov with all meds in hand NP

## 2017-01-02 NOTE — Patient Instructions (Signed)
Thank you for choosing [facility].  SUMMARY AND INSTRUCTIONS:  Please start the Breo daily for the next 4-5 days.  Continue over the counter medications.  If you symptoms worsen start the antibiotic.   Medication:  Your prescription(s) have been submitted to your pharmacy or been printed and provided for you. Please take as directed and contact our office if you believe you are having problem(s) with the medication(s) or have any questions.  Follow up:  If your symptoms worsen or fail to improve, please contact our office for further instruction, or in case of emergency go directly to the emergency room at the closest medical facility.    General Recommendations:    Please drink plenty of fluids.  Get plenty of rest   Sleep in humidified air  Use saline nasal sprays  Netti pot   OTC Medications:  Decongestants - helps relieve congestion   Flonase (generic fluticasone) or Nasacort (generic triamcinolone) - please make sure to use the "cross-over" technique at a 45 degree angle towards the opposite eye as opposed to straight up the nasal passageway.   Sudafed (generic pseudoephedrine - Note this is the one that is available behind the pharmacy counter); Products with phenylephrine (-PE) may also be used but is often not as effective as pseudoephedrine.   If you have HIGH BLOOD PRESSURE - Coricidin HBP; AVOID any product that is -D as this contains pseudoephedrine which may increase your blood pressure.  Afrin (oxymetazoline) every 6-8 hours for up to 3 days.   Allergies - helps relieve runny nose, itchy eyes and sneezing   Claritin (generic loratidine), Allegra (fexofenidine), or Zyrtec (generic cyrterizine) for runny nose. These medications should not cause drowsiness.  Note - Benadryl (generic diphenhydramine) may be used however may cause drowsiness  Cough -   Delsym or Robitussin (generic dextromethorphan)  Expectorants - helps loosen mucus to ease  removal   Mucinex (generic guaifenesin) as directed on the package.  Headaches / General Aches   Tylenol (generic acetaminophen) - DO NOT EXCEED 3 grams (3,000 mg) in a 24 hour time period  Advil/Motrin (generic ibuprofen)   Sore Throat -   Salt water gargle   Chloraseptic (generic benzocaine) spray or lozenges / Sucrets (generic dyclonine)

## 2017-01-02 NOTE — Telephone Encounter (Signed)
Spoke with pt. She is aware that MW wants her seen. We do not have any available appointments until Monday. Pt refused to wait until then. She is going to contact her PCP. Nothing further was needed.

## 2017-01-02 NOTE — Telephone Encounter (Signed)
Pt c/o nonprod cough, sore throat, fatigue, chest tightness with cough X2 days.  Pt denies fever, chills.  Pt has taken mucinex, otc cough syrup to help with symptoms.  Requesting further recs.    Pt uses [facility] [address] in [place]   MW please advise on recs.  Thanks!   Attached 11/04/16 AVS  Patient Instructions   See calendar for specific medication instructions and bring it back for each and every office visit for every healthcare provider you see.  Without it,  you may not receive the best quality medical care that we feel you deserve.   You will note that the calendar groups together  your maintenance  medications that are timed at particular times of the day.  Think of this as your checklist for what your doctor has instructed you to do until your next evaluation to see what benefit  there is  to staying on a consistent group of medications intended to keep you well.  The other group at the bottom is entirely up to you to use as you see fit  for specific symptoms that may arise between visits that require you to treat them on an as needed basis.  Think of this as your action plan or "what if" list.    Separating the top medications from the bottom group is fundamental to providing you adequate care going forward.     Let me know if you would like to see the throat specialists at [facility]   Pulmonary follow up is as needed for flare of cough

## 2017-01-08 ENCOUNTER — Ambulatory Visit (INDEPENDENT_AMBULATORY_CARE_PROVIDER_SITE_OTHER): Payer: Medicare Other | Admitting: Family Medicine

## 2017-01-08 ENCOUNTER — Encounter: Payer: Self-pay | Admitting: Family Medicine

## 2017-01-08 VITALS — BP 120/70 | HR 94 | Ht 60.0 in | Wt 163.0 lb

## 2017-01-08 DIAGNOSIS — M5441 Lumbago with sciatica, right side: Secondary | ICD-10-CM

## 2017-01-08 DIAGNOSIS — M79604 Pain in right leg: Secondary | ICD-10-CM

## 2017-01-08 MED ORDER — METHYLPREDNISOLONE ACETATE 80 MG/ML IJ SUSP
80.0000 mg | Freq: Once | INTRAMUSCULAR | Status: AC
  Administered 2017-01-08: 80 mg via INTRAMUSCULAR

## 2017-01-08 MED ORDER — KETOROLAC TROMETHAMINE 60 MG/2ML IM SOLN
60.0000 mg | Freq: Once | INTRAMUSCULAR | Status: AC
  Administered 2017-01-08: 60 mg via INTRAMUSCULAR

## 2017-01-08 NOTE — Assessment & Plan Note (Signed)
Patient is having more of the right sided sciatica again. Patient did have an exacerbation length is 3 months ago. Did respond fairly well to conservative therapy and started same medications again. We discussed home exercises and starting that in the next 48 hours. We discussed icing regimen. Patient will have close follow-up in the next couple weeks. Worsening symptoms patient notes to seek medical attention immediately. If any worsening symptoms such as weakness or numbness advance imaging would be warranted. Once again patient will follow-up in 2 weeks.

## 2017-01-08 NOTE — Progress Notes (Signed)
[facility] [address] [place], [place] [address] Phone: [phone] Subjective:    I'm seeing this patient by the request  of:    CC: Right leg pain  [id number]  Heidi Franklin is a 71 y.o. female coming in with complaint of right leg pain. Patient does have known severe arthritic changes of the right knee. Patient states that it does not seem to be as much of the knee. Seems to be coming from the buttocks region, down the posterior aspect of the leg and can go all the way to the foot. States that it is more of a burning sensation. Making it difficult to walk second due to the pain. Patient denies any new injury. Denies any weakness. Rates the severity pain of 8 out of 10. Over-the-counter medications have not been beneficial.   past imaging a patient back shows the patient does have mild to moderate degenerative changes of the lumbar spine but nothing specific. Seems to be worse at L4-L5. This was independently visualized by me.  Past Medical History:  Diagnosis Date  . ALLERGIC RHINITIS   . Allergy   . Anxiety state, unspecified   . Arthritis   . BRONCHITIS, CHRONIC   . DEPRESSION   . DIABETES MELLITUS, TYPE II   . GERD   . HYPERLIPIDEMIA   . HYPERTENSION   . Psoriasis    Past Surgical History:  Procedure Laterality Date  . ABDOMINAL HYSTERECTOMY  1980  . TRIGGER FINGER RELEASE     Index finger   Social History   Social History  . Marital status: Married    Spouse name: N/A  . Number of children: 0  . Years of education: 60yr colge   Occupational History  . Retired     Social History Main Topics  . Smoking status: Never Smoker  . Smokeless tobacco: Never Used  . Alcohol use No  . Drug use: No  . Sexual activity: Not Asked   Other Topics Concern  . 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 Metformin And Related Diarrhea  . Penicillins Rash   Family History  Problem Relation Age of Onset  . Stroke Mother   . Angina Mother   . Diabetes Father   . Hyperlipidemia Other     Parent  . Hypertension Other     Parent  . Diabetes Sister     x1  . CAD Sister   . Diabetes Brother     x 2  . Colon cancer Neg Hx   . Esophageal cancer Neg Hx   . Rectal cancer Neg Hx   . Stomach cancer Neg Hx   . Pancreatic cancer Neg Hx     Past medical history, social, surgical and family history all reviewed in electronic medical record.  No pertanent information unless stated regarding to the chief complaint.   Review of Systems:Review of systems updated and as accurate as of 01/08/17  No headache, visual changes, nausea, vomiting, diarrhea, constipation, dizziness, abdominal pain, skin rash, fevers, chills, night sweats, weight loss, swollen lymph nodes, chest pain, shortness of breath, mood changes.  Positive muscle aches, body aches  Objective  Blood pressure 120/70, pulse 94, height 5' (1.524 m), weight 163 lb (73.9 kg), SpO2 99 %. Systems examined below as  of 01/08/17   General: No apparent distress alert and oriented x3 mood and affect normal, dressed appropriately.  HEENT: Pupils equal, extraocular movements intact  Respiratory: Patient's speak in full sentences and does not appear short of breath  Cardiovascular: No lower extremity edema, non tender, no erythema  Skin: Warm dry intact with no signs of infection or rash on extremities or on axial skeleton.  Abdomen: Soft nontender  Neuro: Cranial nerves II through XII are intact, neurovascularly intact in all extremities with 2+ DTRs and 2+ pulses.  Lymph: No lymphadenopathy of posterior or anterior cervical chain or axillae bilaterally.  Gait Antalgic gait  MSK:  Non tender with full range of motion and good stability and symmetric strength and tone of shoulders, elbows, wrist, hip, knee and ankles  bilaterally. Moderate arthritic changes of multiple joints Back Exam:  Inspection: Unremarkable  Motion: Flexion 45 deg, Extension 25 deg, Side Bending to 45 deg bilaterally,  Rotation to 45 deg bilaterally  SLR laying: Positive right side XSLR laying: Negative  Palpable tenderness: Moderate to severe tenderness to palpation in the paraspinal musculature of S1 as well as in the gluteal region.Marland Kitchen FABER: Negative in tightness on the right side. Sensory change: Gross sensation intact to all lumbar and sacral dermatomes.  Reflexes: 2+ at both patellar tendons, 2+ at achilles tendons, Babinski's downgoing.  Strength at foot  Plantar-flexion: 5/5 Dorsi-flexion: 5/5 Eversion: 5/5 Inversion: 5/5  Leg strength  Quad: 5/5 Hamstring: 5/5 Hip flexor: 5/5 Hip abductors: 5/5      Impression and Recommendations:     This case required medical decision making of moderate complexity.      Note: This dictation was prepared with Dragon dictation along with smaller phrase technology. Any transcriptional errors that result from this process are unintentional.

## 2017-01-08 NOTE — Patient Instructions (Addendum)
I sorry you are hurting.  Lets try lyrica at night 2 injections today  If in a lot of pain try tramadol but be very careful can make you sleepy  Call me Monday and if not better then we will get MRI  Otherwise see me again in 1-2 weeks.

## 2017-01-10 DIAGNOSIS — E119 Type 2 diabetes mellitus without complications: Secondary | ICD-10-CM | POA: Diagnosis not present

## 2017-01-10 DIAGNOSIS — H25813 Combined forms of age-related cataract, bilateral: Secondary | ICD-10-CM | POA: Diagnosis not present

## 2017-01-10 DIAGNOSIS — H527 Unspecified disorder of refraction: Secondary | ICD-10-CM | POA: Diagnosis not present

## 2017-01-13 NOTE — Progress Notes (Signed)
Pre visit review using our clinic review tool, if applicable. No additional management support is needed unless otherwise documented below in the visit note. 

## 2017-01-13 NOTE — Progress Notes (Addendum)
Subjective:   Heidi Franklin is a 71 y.o. female who presents for Medicare Annual (Subsequent) preventive examination.  Review of Systems:  No ROS.  Medicare Wellness Visit. Cardiac Risk Factors include: diabetes mellitus;advanced age (>20men, >32 women);hypertension;sedentary lifestyle;family history of premature cardiovascular disease Sleep patterns: no sleep issues, feels rested on waking, gets up 2-3 times nightly to void and sleeps 6-7 hours nightly.  Reports sleep has improved from the past, ongoing stress complicates her sleep pattern at times. Reviewed stress reduction techniques and sleep tips with patient.    Home Safety/Smoke Alarms:  Feels safe in home. Smoke alarms in place.   Living environment; residence and Firearm Safety: 1-story house/ trailer, no firearms . Lives with husband Seat Belt Safety/Bike Helmet: Wears seat belt.   Counseling:   Eye Exam- Last 3/18 Dental- last 6/17   Female:   Pap- N/A      Mammo- Last 11/26/16, BI-RADS category 1: negative       Dexa scan- Last 12/20/13, normal       CCS- Last 01/11/14, normal, recall 10 years      Objective:     Vitals: BP 118/62   Pulse 64   Resp 20   Ht 5' (1.524 m)   Wt 161 lb (73 kg)   SpO2 99%   BMI 31.44 kg/m   Body mass index is 31.44 kg/m.   Tobacco History  Smoking Status  . Never Smoker  Smokeless Tobacco  . Never Used     Counseling given: Not Answered   Past Medical History:  Diagnosis Date  . ALLERGIC RHINITIS   . Allergy   . Anxiety state, unspecified   . Arthritis   . BRONCHITIS, CHRONIC   . DEPRESSION   . DIABETES MELLITUS, TYPE II   . GERD   . HYPERLIPIDEMIA   . HYPERTENSION   . Psoriasis    Past Surgical History:  Procedure Laterality Date  . ABDOMINAL HYSTERECTOMY  1980  . TRIGGER FINGER RELEASE     Index finger   Family History  Problem Relation Age of Onset  . Stroke Mother   . Angina Mother   . Diabetes Father   . Hyperlipidemia Other     Parent  .  Hypertension Other     Parent  . Diabetes Sister     x1  . CAD Sister   . Diabetes Brother     x 2  . Colon cancer Neg Hx   . Esophageal cancer Neg Hx   . Rectal cancer Neg Hx   . Stomach cancer Neg Hx   . Pancreatic cancer Neg Hx    History  Sexual Activity  . Sexual activity: No    Outpatient Encounter Prescriptions as of 01/14/2017  Medication Sig  . acetaminophen (TYLENOL) 325 MG tablet Take 650 mg by mouth every 6 (six) hours as needed.  . ALPRAZolam (XANAX) 0.5 MG tablet Take 1 tablet by mouth every 8 hours if needed for anxiety.  Marland Kitchen amLODipine (NORVASC) 10 MG tablet TAKE 1 TABLET EVERY DAY  . aspirin 81 MG tablet Take 81 mg by mouth every morning.   . Calcium Citrate-Vitamin D (CALCIUM + D PO) Take 1 tablet by mouth every morning.  . chlorpheniramine (CHLOR-TRIMETON) 4 MG tablet Take 2 tabs by mouth at bedtime and every 4 hours as needed for drainage/drippy nose/sneezing  . famotidine (PEPCID) 20 MG tablet Take 1 tablet (20 mg total) by mouth at bedtime.  . fluocinonide ointment (LIDEX) 0.05 %  Apply 1 application topically as needed.   . fluticasone (FLONASE) 50 MCG/ACT nasal spray USE 1 SPRAY IN EACH NOSTRIL TWICE DAILY  . glucose blood (ONE TOUCH ULTRA TEST) test strip 1 each by Other route daily. Use 1 strips to check blood sugar twice a day Ex E11.9  . glyBURIDE (DIABETA) 2.5 MG tablet Take 1 tablet (2.5 mg total) by mouth daily with breakfast.  . HYDROcodone-homatropine (HYCODAN) 5-1.5 MG/5ML syrup Take 5 mLs by mouth every 6 (six) hours as needed for cough.  Marland Kitchen ibuprofen (ADVIL,MOTRIN) 600 MG tablet Take 1 tablet (600 mg total) by mouth every 6 (six) hours as needed.  . Lancets (ONETOUCH ULTRASOFT) lancets PRN  . levofloxacin (LEVAQUIN) 500 MG tablet Take 1 tablet (500 mg total) by mouth daily.  Marland Kitchen lovastatin (MEVACOR) 20 MG tablet Take 1 tablet (20 mg total) by mouth at bedtime.  . mometasone (ELOCON) 0.1 % ointment   . Multiple Vitamins-Minerals (CENTRUM SILVER PO)  Take 1 tablet by mouth every morning.  . naproxen (NAPROSYN) 250 MG tablet Take 1 tablet (250 mg total) by mouth 3 (three) times daily with meals. As needed for pain.  Marland Kitchen omeprazole (PRILOSEC) 40 MG capsule Take 1 capsule (40 mg total) by mouth daily.  . potassium chloride SA (K-DUR,KLOR-CON) 20 MEQ tablet TAKE 1 TABLET (20 MEQ TOTAL) BY MOUTH DAILY.  Marland Kitchen Respiratory Therapy Supplies (FLUTTER) DEVI Use as directed  . sertraline (ZOLOFT) 100 MG tablet TAKE 1 TABLET EVERY MORNING  . valsartan-hydrochlorothiazide (DIOVAN HCT) 160-12.5 MG tablet Take 1 tablet by mouth daily.  . VENTOLIN HFA 108 (90 Base) MCG/ACT inhaler inhale 2 puffs by mouth every 6 hours if needed for wheezing  . [DISCONTINUED] pantoprazole (PROTONIX) 40 MG tablet TAKE 1 TABLET TWICE DAILY BEFORE MEALS   No facility-administered encounter medications on file as of 01/14/2017.     Activities of Daily Living In your present state of health, do you have any difficulty performing the following activities: 01/14/2017 08/07/2016  Hearing? N N  Vision? N N  Difficulty concentrating or making decisions? N N  Walking or climbing stairs? N N  Dressing or bathing? N N  Doing errands, shopping? N N  Preparing Food and eating ? N N  Using the Toilet? N N  In the past six months, have you accidently leaked urine? N N  Do you have problems with loss of bowel control? N N  Managing your Medications? N N  Managing your Finances? N N  Housekeeping or managing your Housekeeping? N N  Some recent data might be hidden    Patient Care Team: Binnie Rail, MD as PCP - General (Internal Medicine) Chesley Mires, MD (Pulmonary Disease) Gean Birchwood, DPM (Podiatry) Almedia Balls, MD (Orthopedic Surgery) Irene Shipper, MD (Gastroenterology) Star Age, MD (Neurology)    Assessment:    Physical assessment deferred to PCP.  Exercise Activities and Dietary recommendations Current Exercise Habits: The patient does not participate in regular  exercise at present (patient  reports that she would like to start walking  when leg pain improves), Exercise limited by: orthopedic condition(s) (Right leg pain)  Diet (meal preparation, eat out, water intake, caffeinated beverages, dairy products, fruits and vegetables): Patient reports she follows  diabetic, low fat/ cholesterol, low salt diet. Drinks 3 bottles of water per day, soda 1-2 per day.  Encouraged patient to limit soda to 1 per day and to maintain or increase her water intake.  Discussed portion control.  Goals    .  patient          Will reduce stress; take time off from more stress and seek opportunities to rest.    . Reduce stress          Continue to slow down, use mindfulness to help me realize what is out of my control. Read, play tablet games, do bible lessons to take my mind off of the stress.       Fall Risk Fall Risk  01/14/2017 08/07/2016 07/29/2016 06/15/2015 03/01/2015  Falls in the past year? Yes Yes Yes Yes Yes  Number falls in past yr: 2 or more 2 or more 2 or more 2 or more 1  Injury with Fall? No - No Yes No  Risk for fall due to : Impaired mobility - - - -  Follow up Falls prevention discussed;Education provided Education provided;Follow up appointment - - -   Depression Screen PHQ 2/9 Scores 01/14/2017 08/07/2016 07/29/2016 06/15/2015  PHQ - 2 Score 1 0 0 4  PHQ- 9 Score - - - 9     Cognitive Function MMSE - Mini Mental State Exam 08/07/2016  Not completed: (No Data)       Ad8 score reviewed for issues:  Issues making decisions: no  Less interest in hobbies / activities: no  Repeats questions, stories (family complaining): no  Trouble using ordinary gadgets (microwave, computer, phone): no  Forgets the month or year: no  Mismanaging finances: no  Remembering appts: no  Daily problems with thinking and/or memory: no Ad8 score is= 0     Immunization History  Administered Date(s) Administered  . Influenza Split 08/20/2011, 07/30/2012,  06/28/2014  . Influenza Whole 07/28/2009, 07/06/2010  . Influenza,inj,Quad PF,36+ Mos 06/23/2013, 07/05/2015, 06/20/2016  . Influenza-Unspecified 06/28/2014  . Pneumococcal Conjugate-13 06/15/2015  . Pneumococcal Polysaccharide-23 10/02/2011  . Td 11/23/2013   Screening Tests Health Maintenance  Topic Date Due  . OPHTHALMOLOGY EXAM  01/14/2018 (Originally 11/29/2015)  . HEMOGLOBIN A1C  02/06/2017  . FOOT EXAM  07/02/2017  . MAMMOGRAM  11/26/2018  . TETANUS/TDAP  11/24/2023  . COLONOSCOPY  01/12/2024  . INFLUENZA VACCINE  Completed  . DEXA SCAN  Completed  . Hepatitis C Screening  Completed  . PNA vac Low Risk Adult  Completed      Plan:     Continue to eat heart healthy diet (full of fruits, vegetables, whole grains, lean protein, water--limit salt, fat, and sugar intake) and increase physical activity as tolerated.  Continue doing brain stimulating activities (puzzles, reading, adult coloring books, staying active) to keep memory sharp.    Discuss with Dr. Quay Burow regarding lung nodules results on renal CT scan during your upcoming visit.  Ask Dr. Tamala Julian about exercising to help leg pain during your upcoming visit.  Stress and insomnia education were added to patient's AVS  During the course of the visit the patient was educated and counseled about the following appropriate screening and preventive services:   Vaccines to include Pneumoccal, Influenza, Hepatitis B, Td, Zostavax, HCV  Cardiovascular Disease  Colorectal cancer screening  Bone density screening  Diabetes screening  Glaucoma screening  Mammography/PAP  Nutrition counseling   Patient Instructions (the written plan) was given to the patient.   Michiel Cowboy, RN  01/14/2017   Medical screening examination/treatment/procedure(s) were performed by non-physician practitioner and as supervising physician I was immediately available for consultation/collaboration. I agree with above. Binnie Rail, MD

## 2017-01-14 ENCOUNTER — Ambulatory Visit (INDEPENDENT_AMBULATORY_CARE_PROVIDER_SITE_OTHER): Payer: Medicare Other | Admitting: *Deleted

## 2017-01-14 VITALS — BP 118/62 | HR 64 | Resp 20 | Ht 60.0 in | Wt 161.0 lb

## 2017-01-14 DIAGNOSIS — Z0000 Encounter for general adult medical examination without abnormal findings: Secondary | ICD-10-CM

## 2017-01-14 NOTE — Patient Instructions (Addendum)
Continue to eat heart healthy diet (full of fruits, vegetables, whole grains, lean protein, water--limit salt, fat, and sugar intake) and increase physical activity as tolerated.   Continue doing brain stimulating activities (puzzles, reading, adult coloring books, staying active) to keep memory sharp.    Discuss with Dr. Quay Burow regarding lung nodules on CT scan during your upcoming visit.  Ask Dr. Tamala Julian about exercising to help leg pain.  Heidi Franklin , Thank you for taking time to come for your Medicare Wellness Visit. I appreciate your ongoing commitment to your health goals. Please review the following plan we discussed and let me know if I can assist you in the future.   These are the goals we discussed: Goals    . patient          Will reduce stress; take time off from more stress and seek opportunities to rest.    . Reduce stress          Continue to slow down, use mindfulness to help me realize what is out of my control. Read, play tablet games, do bible lessons to take my mind off of the stress.        This is a list of the screening recommended for you and due dates:  Health Maintenance  Topic Date Due  . Eye exam for diabetics  11/29/2015  . Hemoglobin A1C  02/06/2017  . Complete foot exam   07/02/2017  . Mammogram  11/26/2018  . Tetanus Vaccine  11/24/2023  . Colon Cancer Screening  01/12/2024  . Flu Shot  Completed  . DEXA scan (bone density measurement)  Completed  .  Hepatitis C: One time screening is recommended by Center for Disease Control  (CDC) for  adults born from [birth year] through 1966.   Completed  . Pneumonia vaccines  Completed   Insomnia Insomnia is a sleep disorder that makes it difficult to fall asleep or to stay asleep. Insomnia can cause tiredness (fatigue), low energy, difficulty concentrating, mood swings, and poor performance at work or school. There are three different ways to classify insomnia:  Difficulty falling asleep.  Difficulty staying  asleep.  Waking up too early in the morning. Any type of insomnia can be long-term (chronic) or short-term (acute). Both are common. Short-term insomnia usually lasts for three months or less. Chronic insomnia occurs at least three times a week for longer than three months. What are the causes? Insomnia may be caused by another condition, situation, or substance, such as:  Anxiety.  Certain medicines.  Gastroesophageal reflux disease (GERD) or other gastrointestinal conditions.  Asthma or other breathing conditions.  Restless legs syndrome, sleep apnea, or other sleep disorders.  Chronic pain.  Menopause. This may include hot flashes.  Stroke.  Abuse of alcohol, tobacco, or illegal drugs.  Depression.  Caffeine.  Neurological disorders, such as Alzheimer disease.  An overactive thyroid (hyperthyroidism). The cause of insomnia may not be known. What increases the risk? Risk factors for insomnia include:  Gender. Women are more commonly affected than men.  Age. Insomnia is more common as you get older.  Stress. This may involve your professional or personal life.  Income. Insomnia is more common in people with lower income.  Lack of exercise.  Irregular work schedule or night shifts.  Traveling between different time zones. What are the signs or symptoms? If you have insomnia, trouble falling asleep or trouble staying asleep is the main symptom. This may lead to other symptoms, such as:  Feeling  fatigued.  Feeling nervous about going to sleep.  Not feeling rested in the morning.  Having trouble concentrating.  Feeling irritable, anxious, or depressed. How is this treated? Treatment for insomnia depends on the cause. If your insomnia is caused by an underlying condition, treatment will focus on addressing the condition. Treatment may also include:  Medicines to help you sleep.  Counseling or therapy.  Lifestyle adjustments. Follow these instructions  at home:  Take medicines only as directed by your health care provider.  Keep regular sleeping and waking hours. Avoid naps.  Keep a sleep diary to help you and your health care provider figure out what could be causing your insomnia. Include:  When you sleep.  When you wake up during the night.  How well you sleep.  How rested you feel the next day.  Any side effects of medicines you are taking.  What you eat and drink.  Make your bedroom a comfortable place where it is easy to fall asleep:  Put up shades or special blackout curtains to block light from outside.  Use a white noise machine to block noise.  Keep the temperature cool.  Exercise regularly as directed by your health care provider. Avoid exercising right before bedtime.  Use relaxation techniques to manage stress. Ask your health care provider to suggest some techniques that may work well for you. These may include:  Breathing exercises.  Routines to release muscle tension.  Visualizing peaceful scenes.  Cut back on alcohol, caffeinated beverages, and cigarettes, especially close to bedtime. These can disrupt your sleep.  Do not overeat or eat spicy foods right before bedtime. This can lead to digestive discomfort that can make it hard for you to sleep.  Limit screen use before bedtime. This includes:  Watching TV.  Using your smartphone, tablet, and computer.  Stick to a routine. This can help you fall asleep faster. Try to do a quiet activity, brush your teeth, and go to bed at the same time each night.  Get out of bed if you are still awake after 15 minutes of trying to sleep. Keep the lights down, but try reading or doing a quiet activity. When you feel sleepy, go back to bed.  Make sure that you drive carefully. Avoid driving if you feel very sleepy.  Keep all follow-up appointments as directed by your health care provider. This is important. Contact a health care provider if:  You are tired  throughout the day or have trouble in your daily routine due to sleepiness.  You continue to have sleep problems or your sleep problems get worse. Get help right away if:  You have serious thoughts about hurting yourself or someone else. This information is not intended to replace advice given to you by your health care provider. Make sure you discuss any questions you have with your health care provider. Document Released: 10/11/2000 Document Revised: 03/15/2016 Document Reviewed: 07/15/2014 Elsevier Interactive Patient Education  2017 [place] and Stress Management Stress is a normal reaction to life events. It is what you feel when life demands more than you are used to or more than you can handle. Some stress can be useful. For example, the stress reaction can help you catch the last bus of the day, study for a test, or meet a deadline at work. But stress that occurs too often or for too long can cause problems. It can affect your emotional health and interfere with relationships and normal daily activities. Too  much stress can weaken your immune system and increase your risk for physical illness. If you already have a medical problem, stress can make it worse. What are the causes? All sorts of life events may cause stress. An event that causes stress for one person may not be stressful for another person. Major life events commonly cause stress. These may be positive or negative. Examples include losing your job, moving into a new home, getting married, having a baby, or losing a loved one. Less obvious life events may also cause stress, especially if they occur day after day or in combination. Examples include working long hours, driving in traffic, caring for children, being in debt, or being in a difficult relationship. What are the signs or symptoms? Stress may cause emotional symptoms including, the following:  Anxiety. This is feeling worried, afraid, on edge, overwhelmed,  or out of control.  Anger. This is feeling irritated or impatient.  Depression. This is feeling sad, down, helpless, or guilty.  Difficulty focusing, remembering, or making decisions. Stress may cause physical symptoms, including the following:  Aches and pains. These may affect your head, neck, back, stomach, or other areas of your body.  Tight muscles or clenched jaw.  Low energy or trouble sleeping. Stress may cause unhealthy behaviors, including the following:  Eating to feel better (overeating) or skipping meals.  Sleeping too little, too much, or both.  Working too much or putting off tasks (procrastination).  Smoking, drinking alcohol, or using drugs to feel better. How is this diagnosed? Stress is diagnosed through an assessment by your health care provider. Your health care provider will ask questions about your symptoms and any stressful life events.Your health care provider will also ask about your medical history and may order blood tests or other tests. Certain medical conditions and medicine can cause physical symptoms similar to stress. Mental illness can cause emotional symptoms and unhealthy behaviors similar to stress. Your health care provider may refer you to a mental health professional for further evaluation. How is this treated? Stress management is the recommended treatment for stress.The goals of stress management are reducing stressful life events and coping with stress in healthy ways. Techniques for reducing stressful life events include the following:  Stress identification. Self-monitor for stress and identify what causes stress for you. These skills may help you to avoid some stressful events.  Time management. Set your priorities, keep a calendar of events, and learn to say "no." These tools can help you avoid making too many commitments. Techniques for coping with stress include the following:  Rethinking the problem. Try to think realistically  about stressful events rather than ignoring them or overreacting. Try to find the positives in a stressful situation rather than focusing on the negatives.  Exercise. Physical exercise can release both physical and emotional tension. The key is to find a form of exercise you enjoy and do it regularly.  Relaxation techniques. These relax the body and mind. Examples include yoga, meditation, tai chi, biofeedback, deep breathing, progressive muscle relaxation, listening to music, being out in nature, journaling, and other hobbies. Again, the key is to find one or more that you enjoy and can do regularly.  Healthy lifestyle. Eat a balanced diet, get plenty of sleep, and do not smoke. Avoid using alcohol or drugs to relax.  Strong support network. Spend time with family, friends, or other people you enjoy being around.Express your feelings and talk things over with someone you trust. Counseling or talktherapy with a  mental health professional may be helpful if you are having difficulty managing stress on your own. Medicine is typically not recommended for the treatment of stress.Talk to your health care provider if you think you need medicine for symptoms of stress. Follow these instructions at home:  Keep all follow-up visits as directed by your health care provider.  Take all medicines as directed by your health care provider. Contact a health care provider if:  Your symptoms get worse or you start having new symptoms.  You feel overwhelmed by your problems and can no longer manage them on your own. Get help right away if:  You feel like hurting yourself or someone else. This information is not intended to replace advice given to you by your health care provider. Make sure you discuss any questions you have with your health care provider. Document Released: 04/09/2001 Document Revised: 03/21/2016 Document Reviewed: 06/08/2013 Elsevier Interactive Patient Education  2017 [place] Prevention in the Home Falls can cause injuries. They can happen to people of all ages. There are many things you can do to make your home safe and to help prevent falls. What can I do on the outside of my home?  Regularly fix the edges of walkways and driveways and fix any cracks.  Remove anything that might make you trip as you walk through a door, such as a raised step or threshold.  Trim any bushes or trees on the path to your home.  Use bright outdoor lighting.  Clear any walking paths of anything that might make someone trip, such as rocks or tools.  Regularly check to see if handrails are loose or broken. Make sure that both sides of any steps have handrails.  Any raised decks and porches should have guardrails on the edges.  Have any leaves, snow, or ice cleared regularly.  Use sand or salt on walking paths during winter.  Clean up any spills in your garage right away. This includes oil or grease spills. What can I do in the bathroom?  Use night lights.  Install grab bars by the toilet and in the tub and shower. Do not use towel bars as grab bars.  Use non-skid mats or decals in the tub or shower.  If you need to sit down in the shower, use a plastic, non-slip stool.  Keep the floor dry. Clean up any water that spills on the floor as soon as it happens.  Remove soap buildup in the tub or shower regularly.  Attach bath mats securely with double-sided non-slip rug tape.  Do not have throw rugs and other things on the floor that can make you trip. What can I do in the bedroom?  Use night lights.  Make sure that you have a light by your bed that is easy to reach.  Do not use any sheets or blankets that are too big for your bed. They should not hang down onto the floor.  Have a firm chair that has side arms. You can use this for support while you get dressed.  Do not have throw rugs and other things on the floor that can make you trip. What can I  do in the kitchen?  Clean up any spills right away.  Avoid walking on wet floors.  Keep items that you use a lot in easy-to-reach places.  If you need to reach something above you, use a strong step stool that has a grab bar.  Keep electrical cords  out of the way.  Do not use floor polish or wax that makes floors slippery. If you must use wax, use non-skid floor wax.  Do not have throw rugs and other things on the floor that can make you trip. What can I do with my stairs?  Do not leave any items on the stairs.  Make sure that there are handrails on both sides of the stairs and use them. Fix handrails that are broken or loose. Make sure that handrails are as long as the stairways.  Check any carpeting to make sure that it is firmly attached to the stairs. Fix any carpet that is loose or worn.  Avoid having throw rugs at the top or bottom of the stairs. If you do have throw rugs, attach them to the floor with carpet tape.  Make sure that you have a light switch at the top of the stairs and the bottom of the stairs. If you do not have them, ask someone to add them for you. What else can I do to help prevent falls?  Wear shoes that:  Do not have high heels.  Have rubber bottoms.  Are comfortable and fit you well.  Are closed at the toe. Do not wear sandals.  If you use a stepladder:  Make sure that it is fully opened. Do not climb a closed stepladder.  Make sure that both sides of the stepladder are locked into place.  Ask someone to hold it for you, if possible.  Clearly mark and make sure that you can see:  Any grab bars or handrails.  First and last steps.  Where the edge of each step is.  Use tools that help you move around (mobility aids) if they are needed. These include:  Canes.  Walkers.  Scooters.  Crutches.  Turn on the lights when you go into a dark area. Replace any light bulbs as soon as they burn out.  Set up your furniture so you have a  clear path. Avoid moving your furniture around.  If any of your floors are uneven, fix them.  If there are any pets around you, be aware of where they are.  Review your medicines with your doctor. Some medicines can make you feel dizzy. This can increase your chance of falling. Ask your doctor what other things that you can do to help prevent falls. This information is not intended to replace advice given to you by your health care provider. Make sure you discuss any questions you have with your health care provider. Document Released: 08/10/2009 Document Revised: 03/21/2016 Document Reviewed: 11/18/2014 Elsevier Interactive Patient Education  2017 [employer].

## 2017-01-23 ENCOUNTER — Ambulatory Visit (INDEPENDENT_AMBULATORY_CARE_PROVIDER_SITE_OTHER): Payer: Medicare Other | Admitting: Family Medicine

## 2017-01-23 ENCOUNTER — Encounter: Payer: Self-pay | Admitting: Family Medicine

## 2017-01-23 ENCOUNTER — Other Ambulatory Visit (INDEPENDENT_AMBULATORY_CARE_PROVIDER_SITE_OTHER): Payer: Medicare Other

## 2017-01-23 VITALS — BP 122/76 | HR 86 | Ht 60.0 in | Wt 162.8 lb

## 2017-01-23 DIAGNOSIS — R5383 Other fatigue: Secondary | ICD-10-CM

## 2017-01-23 DIAGNOSIS — M2550 Pain in unspecified joint: Secondary | ICD-10-CM

## 2017-01-23 DIAGNOSIS — M1711 Unilateral primary osteoarthritis, right knee: Secondary | ICD-10-CM

## 2017-01-23 DIAGNOSIS — E559 Vitamin D deficiency, unspecified: Secondary | ICD-10-CM | POA: Diagnosis not present

## 2017-01-23 DIAGNOSIS — M5441 Lumbago with sciatica, right side: Secondary | ICD-10-CM | POA: Diagnosis not present

## 2017-01-23 LAB — COMPREHENSIVE METABOLIC PANEL
ALT: 14 U/L (ref 0–35)
AST: 16 U/L (ref 0–37)
Albumin: 3.8 g/dL (ref 3.5–5.2)
Alkaline Phosphatase: 87 U/L (ref 39–117)
BUN: 18 mg/dL (ref 6–23)
CO2: 29 mEq/L (ref 19–32)
Calcium: 9.5 mg/dL (ref 8.4–10.5)
Chloride: 104 mEq/L (ref 96–112)
Creatinine, Ser: 0.9 mg/dL (ref 0.40–1.20)
GFR: 79.18 mL/min (ref >60.00)
Glucose, Bld: 167 mg/dL — ABNORMAL HIGH (ref 70–99)
Potassium: 3.7 mEq/L (ref 3.5–5.1)
Sodium: 141 mEq/L (ref 135–145)
Total Bilirubin: 0.3 mg/dL (ref 0.2–1.2)
Total Protein: 7.5 g/dL (ref 6.0–8.3)

## 2017-01-23 LAB — CBC WITH DIFFERENTIAL/PLATELET
Basophils Absolute: 0 10*3/uL (ref 0.0–0.1)
Basophils Relative: 0.4 % (ref 0.0–3.0)
Eosinophils Absolute: 0.3 10*3/uL (ref 0.0–0.7)
Eosinophils Relative: 2.8 % (ref 0.0–5.0)
HCT: 35.7 % — ABNORMAL LOW (ref 36.0–46.0)
Hemoglobin: 11.8 g/dL — ABNORMAL LOW (ref 12.0–15.0)
Lymphocytes Relative: 23.4 % (ref 12.0–46.0)
Lymphs Abs: 2.1 10*3/uL (ref 0.7–4.0)
MCHC: 33 g/dL (ref 30.0–36.0)
MCV: 85.8 fl (ref 78.0–100.0)
Monocytes Absolute: 0.5 10*3/uL (ref 0.1–1.0)
Monocytes Relative: 5.6 % (ref 3.0–12.0)
Neutro Abs: 6.1 10*3/uL (ref 1.4–7.7)
Neutrophils Relative %: 67.8 % (ref 43.0–77.0)
Platelets: 294 10*3/uL (ref 150.0–400.0)
RBC: 4.16 Mil/uL (ref 3.87–5.11)
RDW: 14.8 % (ref 11.5–15.5)
WBC: 9 10*3/uL (ref 4.0–10.5)

## 2017-01-23 LAB — C-REACTIVE PROTEIN: CRP: 1.8 mg/dL (ref 0.5–20.0)

## 2017-01-23 LAB — SEDIMENTATION RATE: Sed Rate: 38 mm/hr — ABNORMAL HIGH (ref 0–30)

## 2017-01-23 NOTE — Assessment & Plan Note (Signed)
Worsening symptoms.given injection today. Tolerated the proedere We discussed  home exercises patient flow-up  4 weeks. At tht time worsening symptoms we'll consider viscous supplementation.

## 2017-01-23 NOTE — Progress Notes (Signed)
[facility] [address], [place] [address] Phone: [phone] Subjective:    I'm seeing this patient by the request  of:    CC: Right leg pain  TIR:[id number]  Heidi Franklin is a 71 y.o. female coming in with complaint of right leg pain. Patient does have known severe arthritic changes of the right knee is having worsening symptoms of the right knee.mild increasing instability.starting to affect daily activities. Mild increase in low back pain as well.still having the radicula symptomsno weakness but states that it seems to be more constant.    past imaging a patient back shows the patient does have mild to moderate degenerative changes of the lumbar spine but nothing specific. Seems to be worse at L4-L5. This was independently visualized by me.  Past Medical History:  Diagnosis Date  . ALLERGIC RHINITIS   . Allergy   . Anxiety state, unspecified   . Arthritis   . BRONCHITIS, CHRONIC   . DEPRESSION   . DIABETES MELLITUS, TYPE II   . GERD   . HYPERLIPIDEMIA   . HYPERTENSION   . Psoriasis    Past Surgical History:  Procedure Laterality Date  . ABDOMINAL HYSTERECTOMY  1980  . TRIGGER FINGER RELEASE     Index finger   Social History   Social History  . Marital status: Married    Spouse name: N/A  . Number of children: 0  . Years of education: 32yr colge   Occupational History  . Retired     Social History Main Topics  . Smoking status: Never Smoker  . Smokeless tobacco: Never Used  . Alcohol use No  . Drug use: No  . Sexual activity: No   Other Topics Concern  . 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 Metformin And Related Diarrhea  . Penicillins Rash   Family History  Problem Relation Age of Onset  . Stroke Mother   . Angina Mother   . Diabetes Father   .  Hyperlipidemia Other     Parent  . Hypertension Other     Parent  . Diabetes Sister     x1  . CAD Sister   . Diabetes Brother     x 2  . Colon cancer Neg Hx   . Esophageal cancer Neg Hx   . Rectal cancer Neg Hx   . Stomach cancer Neg Hx   . Pancreatic cancer Neg Hx     Past medical history, social, surgical and family history all reviewed in electronic medical record.  No pertanent information unless stated regarding to the chief complaint.   Review of Systems: No headache, visual changes, nausea, vomiting, diarrhea, constipation, dizziness, abdominal pain, skin rash, fevers, chills, night sweats, weight loss, swollen lymph nodes, body aches, js, chest pain, shortness of breath, mood changes.      Positive muscle aches, body aches Severe increasing fatigue recently.  Objective  Blood pressure 122/76, pulse 86, height 5' (1.524 m), weight 162 lb 12.8 oz (73.8 kg), SpO2 99 %.   Systems examined below as of 01/23/17 General: NAD A&O x3 mood, affect normal  HEENT: Pupils equal, extraocular movements intact no nystagmus Respiratory: not short of breath at rest or with speaking Cardiovascular: No lower extremity edema, non tender Skin: Warm dry intact with  no signs of infection or rash on extremities or on axial skeleton. Abdomen: Soft nontender, no masses Neuro: Cranial nerves  intact, neurovascularly intact in all extremities with 2+ DTRs and 2+ pulses. Lymph: No lymphadenopathy appreciated today  Gait Antalgic gait mild worsening MSK:  Mild tender with full range of motion and good stability and symmetric strength and tone of shoulders, elbows, wrist, hip, and ankles bilaterally. Moderate arthritic changes of multiple joints Back Exam:  Inspection: Unremarkable  Motion: Flexion 35 deg, Extension 25 deg, Side Bending to 45 deg bilaterally,  Rotation to 35 deg bilaterally  SLR laying: Positive right side XSLR laying: Negative  Palpable tenderness: Mild worsening tenderness in  the paraspinal musculature of the low back... FABER: Increasing tightness bilaterally Sensory change: Gross sensation intact to all lumbar and sacral dermatomes.  Reflexes: 2+ at both patellar tendons, 2+ at achilles tendons, Babinski's downgoing.  Strength at foot  Plantar-flexion: 5/5 Dorsi-flexion: 5/5 Eversion: 5/5 Inversion: 5/5  Leg strength  Quad: 5/5 Hamstring: 5/5 Hip flexor: 5/5 Hip abductors: 5/5   Knee: Right valgus deformity noted. Large thigh to calf ratio.  Tender to palpation over medial and PF joint line.  ROM full in flexion and extension and lower leg rotation. instability with valgus force.  painful patellar compression. Patellar glide with moderate crepitus. Patellar and quadriceps tendons unremarkable. Hamstring and quadriceps strength is normal. Contralateral knee shows arthritic changes but no pain  After informed written and verbal consent, patient was seated on exam table. Right knee was prepped with alcohol swab and utilizing anterolateral approach, patient's right knee space was injected with 4:1  marcaine 0.5%: Kenalog 40mg /dL. Patient tolerated the procedure well without immediate complications.   Impression and Recommendations:     This case required medical decision making of moderate complexity.      Note: This dictation was prepared with Dragon dictation along with smaller phrase technology. Any transcriptional errors that result from this process are unintentional.

## 2017-01-23 NOTE — Patient Instructions (Signed)
Good to see you  Heidi Franklin is your friend.  Injected the knee again.  We will get labs and I will send it to you in the my chart See me again in 3-5 weeks.

## 2017-01-23 NOTE — Assessment & Plan Note (Signed)
Still intermittant, will continue to monitor.  RTC in 4 weeks and may need MRI.

## 2017-01-23 NOTE — Assessment & Plan Note (Signed)
Ordered today for labs likely iron def.  Will see what labs show.

## 2017-01-24 LAB — ANA: Anti Nuclear Antibody(ANA): NEGATIVE

## 2017-01-24 LAB — RHEUMATOID FACTOR: Rhuematoid fact SerPl-aCnc: <14 IU/mL (ref <14)

## 2017-02-05 ENCOUNTER — Ambulatory Visit (INDEPENDENT_AMBULATORY_CARE_PROVIDER_SITE_OTHER): Payer: Medicare Other | Admitting: Internal Medicine

## 2017-02-05 ENCOUNTER — Encounter: Payer: Self-pay | Admitting: Internal Medicine

## 2017-02-05 ENCOUNTER — Other Ambulatory Visit (INDEPENDENT_AMBULATORY_CARE_PROVIDER_SITE_OTHER): Payer: Medicare Other

## 2017-02-05 VITALS — BP 138/68 | HR 87 | Temp 99.1°F | Resp 16 | Ht 60.0 in | Wt 161.0 lb

## 2017-02-05 DIAGNOSIS — E1165 Type 2 diabetes mellitus with hyperglycemia: Secondary | ICD-10-CM | POA: Diagnosis not present

## 2017-02-05 DIAGNOSIS — K219 Gastro-esophageal reflux disease without esophagitis: Secondary | ICD-10-CM

## 2017-02-05 DIAGNOSIS — IMO0002 Reserved for concepts with insufficient information to code with codable children: Secondary | ICD-10-CM

## 2017-02-05 DIAGNOSIS — D649 Anemia, unspecified: Secondary | ICD-10-CM

## 2017-02-05 DIAGNOSIS — F418 Other specified anxiety disorders: Secondary | ICD-10-CM | POA: Diagnosis not present

## 2017-02-05 DIAGNOSIS — E785 Hyperlipidemia, unspecified: Secondary | ICD-10-CM | POA: Diagnosis not present

## 2017-02-05 DIAGNOSIS — I10 Essential (primary) hypertension: Secondary | ICD-10-CM

## 2017-02-05 DIAGNOSIS — E1169 Type 2 diabetes mellitus with other specified complication: Secondary | ICD-10-CM

## 2017-02-05 DIAGNOSIS — E1140 Type 2 diabetes mellitus with diabetic neuropathy, unspecified: Secondary | ICD-10-CM | POA: Diagnosis not present

## 2017-02-05 LAB — HEMOGLOBIN A1C: Hgb A1c MFr Bld: 6.5 % (ref 4.6–6.5)

## 2017-02-05 LAB — IRON: Iron: 50 ug/dL (ref 42–145)

## 2017-02-05 LAB — FERRITIN: Ferritin: 127.6 ng/mL (ref 10.0–291.0)

## 2017-02-05 MED ORDER — SERTRALINE HCL 100 MG PO TABS: 150.0000 mg | ORAL_TABLET | Freq: Every morning | ORAL | 1 refills | Status: DC

## 2017-02-05 MED ORDER — LANSOPRAZOLE 30 MG PO CPDR: 30.0000 mg | DELAYED_RELEASE_CAPSULE | Freq: Every day | ORAL | 5 refills | Status: DC

## 2017-02-05 NOTE — Progress Notes (Signed)
Subjective:    Patient ID: Heidi Franklin, female    DOB: 1946/05/22, 71 y.o.   MRN: [id number]  HPI The patient is here for follow up.  Both her knees hurts.  She has seen Dr Tamala Julian and has had injection.  This limits her exercise.   Hypertension: She is taking her medication daily. She is compliant with a low sodium diet.  She denies chest pain, palpitations, edema, shortness of breath and regular headaches. She is not exercising regularly.    Diabetes: She is taking her medication daily as prescribed. She is compliant with a diabetic diet. She is not exercising regularly. She monitors her sugars and they have been running 117, 131, 110, 161. She checks her feet daily and denies foot lesions. She is up-to-date with an ophthalmology examination.   Hyperlipidemia: She is taking her medication daily. She is compliant with a low fat/cholesterol diet. She is not exercising regularly. She denies myalgias.   Depression, anxiety: She is taking her medication daily as prescribed. She denies any side effects from the medication. She feels her depression and anxiety are not well controlled.  She has stress in her life.     GERD:  She is taking her medication daily as prescribed.  She has GERD symptoms daily.    At night her skin is hot.  Her husband stays her skin feels warm.  She sweats at night sometimes.    She sometimes wakes up at night and will act out her dreams.  She may be fighting with someone and start scratching her husband.  She sometimes makes noises at night when sleeping.  She have stress in her life.    Medications and allergies reviewed with patient and updated if appropriate.  Patient Active Problem List   Diagnosis Date Noted  . Anemia 02/05/2017  . Fatigue 01/23/2017  . Cough 01/02/2017  . Multiple pulmonary nodules 11/10/2016  . RLQ abdominal pain 10/09/2016  . Acute midline low back pain with right-sided sciatica 10/09/2016  . Moderate right ankle sprain 09/17/2016  .  Fall 08/08/2016  . Neck pain 07/29/2016  . Degenerative arthritis of right knee 06/20/2016  . Foot pain, left 05/28/2016  . Degenerative arthritis of left knee 04/10/2016  . Acute bronchitis 12/15/2015  . Peroneal tendinitis of left lower extremity 09/05/2015  . Recurrent falls 08/08/2015  . Bursitis of left shoulder 07/13/2015  . Morbid obesity due to excess calories ([place]) 04/16/2015  . Nonspecific abnormal electrocardiogram (ECG) (EKG) 05/11/2011  . Tenosynovitis of finger 04/22/2011  . Type 2 diabetes, uncontrolled, with neuropathy ([place]) 03/02/2010  . Hyperlipidemia associated with type 2 diabetes mellitus ([place]) 03/02/2010  . Depression with anxiety 03/02/2010  . Essential hypertension 03/02/2010  . Chronic rhinitis 03/02/2010  . Upper airway cough syndrome 03/02/2010  . GERD 03/02/2010    Current Outpatient Prescriptions on File Prior to Visit  Medication Sig Dispense Refill  . acetaminophen (TYLENOL) 325 MG tablet Take 650 mg by mouth every 6 (six) hours as needed.    . ALPRAZolam (XANAX) 0.5 MG tablet Take 1 tablet by mouth every 8 hours if needed for anxiety. 30 tablet 2  . amLODipine (NORVASC) 10 MG tablet TAKE 1 TABLET EVERY DAY 90 tablet 3  . aspirin 81 MG tablet Take 81 mg by mouth every morning.     . Calcium Citrate-Vitamin D (CALCIUM + D PO) Take 1 tablet by mouth every morning.    . chlorpheniramine (CHLOR-TRIMETON) 4 MG tablet Take 2 tabs by  mouth at bedtime and every 4 hours as needed for drainage/drippy nose/sneezing    . famotidine (PEPCID) 20 MG tablet Take 1 tablet (20 mg total) by mouth at bedtime. 30 tablet 11  . fluocinonide ointment (LIDEX) 2.54 % Apply 1 application topically as needed.     . fluticasone (FLONASE) 50 MCG/ACT nasal spray USE 1 SPRAY IN EACH NOSTRIL TWICE DAILY 48 g 1  . glucose blood (ONE TOUCH ULTRA TEST) test strip 1 each by Other route daily. Use 1 strips to check blood sugar twice a day Ex E11.9 100 each 3  . glyBURIDE (DIABETA) 2.5 MG  tablet Take 1 tablet (2.5 mg total) by mouth daily with breakfast. 90 tablet 1  . ibuprofen (ADVIL,MOTRIN) 600 MG tablet Take 1 tablet (600 mg total) by mouth every 6 (six) hours as needed. 30 tablet 0  . Lancets (ONETOUCH ULTRASOFT) lancets PRN 100 each 3  . lovastatin (MEVACOR) 20 MG tablet Take 1 tablet (20 mg total) by mouth at bedtime. 90 tablet 1  . mometasone (ELOCON) 0.1 % ointment     . Multiple Vitamins-Minerals (CENTRUM SILVER PO) Take 1 tablet by mouth every morning.    . naproxen (NAPROSYN) 250 MG tablet Take 1 tablet (250 mg total) by mouth 3 (three) times daily with meals. As needed for pain. 30 tablet 0  . potassium chloride SA (K-DUR,KLOR-CON) 20 MEQ tablet TAKE 1 TABLET (20 MEQ TOTAL) BY MOUTH DAILY. 90 tablet 3  . Respiratory Therapy Supplies (FLUTTER) DEVI Use as directed 1 each 0  . valsartan-hydrochlorothiazide (DIOVAN HCT) 160-12.5 MG tablet Take 1 tablet by mouth daily. 90 tablet 1  . VENTOLIN HFA 108 (90 Base) MCG/ACT inhaler inhale 2 puffs by mouth every 6 hours if needed for wheezing 18 Inhaler 3   No current facility-administered medications on file prior to visit.     Past Medical History:  Diagnosis Date  . ALLERGIC RHINITIS   . Allergy   . Anxiety state, unspecified   . Arthritis   . BRONCHITIS, CHRONIC   . DEPRESSION   . DIABETES MELLITUS, TYPE II   . GERD   . HYPERLIPIDEMIA   . HYPERTENSION   . Psoriasis     Past Surgical History:  Procedure Laterality Date  . ABDOMINAL HYSTERECTOMY  1980  . TRIGGER FINGER RELEASE     Index finger    Social History   Social History  . Marital status: Married    Spouse name: N/A  . Number of children: 0  . Years of education: 21yr colge   Occupational History  . Retired     Social History Main Topics  . Smoking status: Never Smoker  . Smokeless tobacco: Never Used  . Alcohol use No  . Drug use: No  . Sexual activity: No   Other Topics Concern  . 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 Stroke Mother   . Angina Mother   . Diabetes Father   . Hyperlipidemia Other     Parent  . Hypertension Other     Parent  . Diabetes Sister     x1  . CAD Sister   . Diabetes Brother     x 2  . Colon cancer Neg Hx   . Esophageal cancer Neg  Hx   . Rectal cancer Neg Hx   . Stomach cancer Neg Hx   . Pancreatic cancer Neg Hx     Review of Systems  Constitutional: Positive for diaphoresis (at night, occasional). Negative for chills and fever.  Respiratory: Negative for cough, shortness of breath and wheezing.   Cardiovascular: Negative for chest pain, palpitations and leg swelling.  Gastrointestinal: Negative for abdominal pain.  Musculoskeletal: Positive for arthralgias.  Neurological: Positive for light-headedness (occ). Negative for headaches.       Objective:   Vitals:   02/05/17 1046  BP: 138/68  Pulse: 87  Resp: 16  Temp: 99.1 F (37.3 C)   Wt Readings from Last 3 Encounters:  02/05/17 161 lb (73 kg)  01/23/17 162 lb 12.8 oz (73.8 kg)  01/14/17 161 lb (73 kg)   Body mass index is 31.44 kg/m.   Physical Exam    Constitutional: Appears well-developed and well-nourished. No distress.  HENT:  Head: Normocephalic and atraumatic.  Neck: Neck supple. No tracheal deviation present. No thyromegaly present.  No cervical lymphadenopathy Cardiovascular: Normal rate, regular rhythm and normal heart sounds.   No murmur heard. No carotid bruit .  No edema Pulmonary/Chest: Effort normal and breath sounds normal. No respiratory distress. No has no wheezes. No rales.  Skin: Skin is warm and dry. Not diaphoretic.  Psychiatric: Normal mood and affect. Behavior is normal.      Assessment & Plan:    See Problem List for Assessment and Plan of chronic medical problems.

## 2017-02-05 NOTE — Assessment & Plan Note (Signed)
BP borderline high / controlled Current regimen effective and well tolerated Continue current medications at current doses

## 2017-02-05 NOTE — Assessment & Plan Note (Signed)
Very mild Colonoscopy up to date Check iron, ferritin

## 2017-02-05 NOTE — Assessment & Plan Note (Signed)
Has some anxiety and sometimes depressed Trial of increasing sertraline to 150 mg daily.

## 2017-02-05 NOTE — Patient Instructions (Addendum)
  Test(s) ordered today. Your results will be released to [place] (or called to you) after review, usually within 72hours after test completion. If any changes need to be made, you will be notified at that same time.  All other Health Maintenance issues reviewed.   All recommended immunizations and age-appropriate screenings are up-to-date or discussed.  No immunizations administered today.   Medications reviewed and updated.  Changes include stopping omeprazole and starting lansoprazole 30 mg daily.  Continue the pepcid.    Please followup in 6 months

## 2017-02-05 NOTE — Assessment & Plan Note (Signed)
Check a1c Low sugar / carb diet Stressed regular exercise, weight loss  

## 2017-02-05 NOTE — Progress Notes (Signed)
Pre visit review using our clinic review tool, if applicable. No additional management support is needed unless otherwise documented below in the visit note. 

## 2017-02-05 NOTE — Assessment & Plan Note (Addendum)
Omeprazole 40 mg and protonix 40 mg not effective Taking pepcid nightly Change to prevacid daily.  If no improvement may need to see GI

## 2017-02-05 NOTE — Assessment & Plan Note (Signed)
Lipids have been controlled Continue statin 

## 2017-02-06 ENCOUNTER — Other Ambulatory Visit: Payer: Self-pay | Admitting: Internal Medicine

## 2017-02-08 ENCOUNTER — Encounter: Payer: Self-pay | Admitting: Internal Medicine

## 2017-02-21 ENCOUNTER — Ambulatory Visit (INDEPENDENT_AMBULATORY_CARE_PROVIDER_SITE_OTHER)
Admission: RE | Admit: 2017-02-21 | Discharge: 2017-02-21 | Disposition: A | Discharge status: Home / Self Care | Payer: Medicare Other | Source: Ambulatory Visit | Attending: Nurse Practitioner | Admitting: Nurse Practitioner

## 2017-02-21 ENCOUNTER — Encounter: Payer: Self-pay | Admitting: Nurse Practitioner

## 2017-02-21 ENCOUNTER — Telehealth: Payer: Self-pay | Admitting: Internal Medicine

## 2017-02-21 ENCOUNTER — Ambulatory Visit (INDEPENDENT_AMBULATORY_CARE_PROVIDER_SITE_OTHER): Payer: Medicare Other | Admitting: Nurse Practitioner

## 2017-02-21 VITALS — BP 120/60 | HR 87 | Temp 98.5°F | Ht 60.0 in | Wt 163.0 lb

## 2017-02-21 DIAGNOSIS — J310 Chronic rhinitis: Secondary | ICD-10-CM

## 2017-02-21 DIAGNOSIS — J069 Acute upper respiratory infection, unspecified: Secondary | ICD-10-CM

## 2017-02-21 DIAGNOSIS — R05 Cough: Secondary | ICD-10-CM | POA: Diagnosis not present

## 2017-02-21 MED ORDER — HYDROCODONE-HOMATROPINE 5-1.5 MG/5ML PO SYRP: 5.0000 mL | ORAL_SOLUTION | Freq: Every evening | ORAL | 0 refills | Status: DC | PRN

## 2017-02-21 MED ORDER — METHYLPREDNISOLONE ACETATE 80 MG/ML IJ SUSP
80.0000 mg | Freq: Once | INTRAMUSCULAR | Status: AC
  Administered 2017-02-21: 80 mg via INTRAMUSCULAR

## 2017-02-21 NOTE — Progress Notes (Signed)
Pre visit review using our clinic review tool, if applicable. No additional management support is needed unless otherwise documented below in the visit note. 

## 2017-02-21 NOTE — Telephone Encounter (Signed)
Please call pt back with results from today.  Call cell phone

## 2017-02-21 NOTE — Progress Notes (Signed)
Subjective:  Patient ID: Heidi Franklin, female    DOB: 11/24/1945  Age: 71 y.o. MRN: [id number]  CC: Cough (coughing,feel like it is pulling from chest, 3 days. hx of bronchitis)   Cough  This is a recurrent problem. The current episode started in the past 7 days. The problem has been unchanged. The cough is non-productive. Associated symptoms include nasal congestion, postnasal drip and rhinorrhea. Pertinent negatives include no chest pain, chills, ear congestion, ear pain, fever, headaches, heartburn, hemoptysis, myalgias, rash, sore throat, shortness of breath, sweats or weight loss. The symptoms are aggravated by lying down and cold air. She has tried OTC cough suppressant for the symptoms. The treatment provided mild relief. Her past medical history is significant for bronchitis and environmental allergies.    Outpatient Medications Prior to Visit  Medication Sig Dispense Refill  . acetaminophen (TYLENOL) 325 MG tablet Take 650 mg by mouth every 6 (six) hours as needed.    . ALPRAZolam (XANAX) 0.5 MG tablet take 1 tablet by mouth every 8 hours if needed for anxiety 30 tablet 2  . amLODipine (NORVASC) 10 MG tablet TAKE 1 TABLET EVERY DAY 90 tablet 3  . aspirin 81 MG tablet Take 81 mg by mouth every morning.     . Calcium Citrate-Vitamin D (CALCIUM + D PO) Take 1 tablet by mouth every morning.    . chlorpheniramine (CHLOR-TRIMETON) 4 MG tablet Take 2 tabs by mouth at bedtime and every 4 hours as needed for drainage/drippy nose/sneezing    . famotidine (PEPCID) 20 MG tablet Take 1 tablet (20 mg total) by mouth at bedtime. 30 tablet 11  . fluocinonide ointment (LIDEX) 2.40 % Apply 1 application topically as needed.     . fluticasone (FLONASE) 50 MCG/ACT nasal spray USE 1 SPRAY IN EACH NOSTRIL TWICE DAILY 48 g 1  . glucose blood (ONE TOUCH ULTRA TEST) test strip 1 each by Other route daily. Use 1 strips to check blood sugar twice a day Ex E11.9 100 each 3  . glyBURIDE (DIABETA) 2.5 MG tablet  Take 1 tablet (2.5 mg total) by mouth daily with breakfast. 90 tablet 1  . ibuprofen (ADVIL,MOTRIN) 600 MG tablet Take 1 tablet (600 mg total) by mouth every 6 (six) hours as needed. 30 tablet 0  . Lancets (ONETOUCH ULTRASOFT) lancets PRN 100 each 3  . lansoprazole (PREVACID) 30 MG capsule Take 1 capsule (30 mg total) by mouth daily before breakfast. 30 capsule 5  . lovastatin (MEVACOR) 20 MG tablet Take 1 tablet (20 mg total) by mouth at bedtime. 90 tablet 1  . mometasone (ELOCON) 0.1 % ointment     . Multiple Vitamins-Minerals (CENTRUM SILVER PO) Take 1 tablet by mouth every morning.    . naproxen (NAPROSYN) 250 MG tablet Take 1 tablet (250 mg total) by mouth 3 (three) times daily with meals. As needed for pain. 30 tablet 0  . potassium chloride SA (K-DUR,KLOR-CON) 20 MEQ tablet TAKE 1 TABLET (20 MEQ TOTAL) BY MOUTH DAILY. 90 tablet 3  . Respiratory Therapy Supplies (FLUTTER) DEVI Use as directed 1 each 0  . sertraline (ZOLOFT) 100 MG tablet Take 1.5 tablets (150 mg total) by mouth every morning. 135 tablet 1  . valsartan-hydrochlorothiazide (DIOVAN HCT) 160-12.5 MG tablet Take 1 tablet by mouth daily. 90 tablet 1  . VENTOLIN HFA 108 (90 Base) MCG/ACT inhaler inhale 2 puffs by mouth every 6 hours if needed for wheezing 18 Inhaler 3   No facility-administered medications prior to visit.  ROS See HPI  Objective:  BP 120/60   Pulse 87   Temp 98.5 F (36.9 C)   Ht 5' (1.524 m)   Wt 163 lb (73.9 kg)   SpO2 98%   BMI 31.83 kg/m   BP Readings from Last 3 Encounters:  02/21/17 120/60  02/05/17 138/68  01/23/17 122/76    Wt Readings from Last 3 Encounters:  02/21/17 163 lb (73.9 kg)  02/05/17 161 lb (73 kg)  01/23/17 162 lb 12.8 oz (73.8 kg)    Physical Exam  Constitutional: She is oriented to person, place, and time.  HENT:  Right Ear: Tympanic membrane, external ear and ear canal normal.  Left Ear: Tympanic membrane, external ear and ear canal normal.  Nose: Mucosal  edema and rhinorrhea present. Right sinus exhibits no maxillary sinus tenderness and no frontal sinus tenderness. Left sinus exhibits no maxillary sinus tenderness and no frontal sinus tenderness.  Mouth/Throat: Uvula is midline. No trismus in the jaw. Posterior oropharyngeal erythema present. No oropharyngeal exudate.  Eyes: No scleral icterus.  Neck: Normal range of motion. Neck supple.  Cardiovascular: Normal rate and normal heart sounds.   Pulmonary/Chest: Effort normal and breath sounds normal.  Musculoskeletal: She exhibits no edema.  Lymphadenopathy:    She has no cervical adenopathy.  Neurological: She is alert and oriented to person, place, and time.  Vitals reviewed.   Lab Results  Component Value Date   WBC 9.0 01/23/2017   HGB 11.8 (L) 01/23/2017   HCT 35.7 (L) 01/23/2017   PLT 294.0 01/23/2017   GLUCOSE 167 (H) 01/23/2017   CHOL 161 08/08/2016   TRIG 174.0 (H) 08/08/2016   HDL 33.20 (L) 08/08/2016   LDLDIRECT 155.1 02/09/2013   LDLCALC 93 08/08/2016   ALT 14 01/23/2017   AST 16 01/23/2017   NA 141 01/23/2017   K 3.7 01/23/2017   CL 104 01/23/2017   CREATININE 0.90 01/23/2017   BUN 18 01/23/2017   CO2 29 01/23/2017   TSH 3.90 08/08/2016   HGBA1C 6.5 02/05/2017   MICROALBUR 1.6 08/08/2016    Ct Renal Stone Study  Result Date: 10/14/2016 CLINICAL DATA:  Right lower quadrant pain.  Microscopic hematuria EXAM: CT ABDOMEN AND PELVIS WITHOUT CONTRAST TECHNIQUE: Multidetector CT imaging of the abdomen and pelvis was performed following the standard protocol without IV contrast. COMPARISON:  None. FINDINGS: Lower chest: Two 5 mm nodules in the right lower lobe. 3 mm nodule left lower lobe. No infiltrate or effusion Hepatobiliary: Mild enlargement of the left lobe of the liver may indicate chronic liver disease. No liver mass. Gallbladder and bile ducts normal. Pancreas: Negative Spleen: Negative Adrenals/Urinary Tract: Negative. No renal obstruction or stone. No mass  lesion. Urinary bladder empty. Stomach/Bowel: Negative for bowel obstruction. No bowel mass or edema. Normal appendix. Vascular/Lymphatic: Mild atherosclerotic calcification of the aorta and iliac arteries. No mass lesion. Negative for lymphadenopathy Reproductive: Hysterectomy.  No pelvic mass. Other: Negative for free fluid.  No hernia. Musculoskeletal: Negative IMPRESSION: No acute abnormality.  No renal calculi identified. Small lung nodules in the lung bases bilaterally. These are most likely benign. Consider follow-up CT chest to evaluate evaluate for other nodules. Electronically Signed   By: Franchot Gallo M.D.   On: 10/14/2016 15:24    Assessment & Plan:   Giah was seen today for cough.  Diagnoses and all orders for this visit:  Chronic rhinitis  Acute URI -     DG Chest 2 View; Future -  methylPREDNISolone acetate (DEPO-MEDROL) injection 80 mg; Inject 1 mL (80 mg total) into the muscle once. -     HYDROcodone-homatropine (HYCODAN) 5-1.5 MG/5ML syrup; Take 5 mLs by mouth at bedtime as needed for cough.   I am having Heidi Franklin start on HYDROcodone-homatropine. I am also having her maintain her aspirin, chlorpheniramine, Multiple Vitamins-Minerals (CENTRUM SILVER PO), famotidine, FLUTTER, Calcium Citrate-Vitamin D (CALCIUM + D PO), glucose blood, onetouch ultrasoft, potassium chloride SA, amLODipine, fluocinonide ointment, mometasone, acetaminophen, VENTOLIN HFA, ibuprofen, fluticasone, naproxen, valsartan-hydrochlorothiazide, lovastatin, glyBURIDE, lansoprazole, sertraline, ALPRAZolam, and omeprazole. We will continue to administer methylPREDNISolone acetate.  Meds ordered this encounter  Medications  . omeprazole (PRILOSEC) 40 MG capsule    Sig: Take 40 mg by mouth 1 day or 1 dose.  . methylPREDNISolone acetate (DEPO-MEDROL) injection 80 mg  . HYDROcodone-homatropine (HYCODAN) 5-1.5 MG/5ML syrup    Sig: Take 5 mLs by mouth at bedtime as needed for cough.    Dispense:  120 mL     Refill:  0    Order Specific Question:   Supervising Provider    Answer:   Cassandria Anger [[id number]]    Follow-up: No Follow-up on file.  Wilfred Lacy, NP

## 2017-02-21 NOTE — Patient Instructions (Addendum)
Normal CXR. No oral abx needed at this time.  Use albuterol as prescribed.  URI Instructions: Flonase and Afrin use: apply 1spray of afrin in each nare, wait 55mins, then apply 2sprays of flonase in each nare. Use both nasal spray consecutively x 3days, then flonase only for at least 14days.  Encourage adequate oral hydration.  Use over-the-counter  "cold" medicines  such as "Tylenol cold" , "Advil cold",  "Mucinex" or" Mucinex D"  for cough and congestion.  Avoid decongestants if you have high blood pressure. Use" Delsym" or" Robitussin" cough syrup varietis for cough.  You can use plain "Tylenol" or "Advi"l for fever, chills and achyness.   "Common cold" symptoms are usually triggered by a virus.  The antibiotics are usually not necessary. On average, a" viral cold" illness would take 4-7 days to resolve. Please, make an appointment if you are not better or if you're worse.

## 2017-02-24 ENCOUNTER — Telehealth: Payer: Self-pay | Admitting: Nurse Practitioner

## 2017-02-24 DIAGNOSIS — J40 Bronchitis, not specified as acute or chronic: Secondary | ICD-10-CM

## 2017-02-24 MED ORDER — AZITHROMYCIN 250 MG PO TABS: 250.0000 mg | ORAL_TABLET | Freq: Every day | ORAL | 0 refills | Status: DC

## 2017-02-24 NOTE — Telephone Encounter (Signed)
Patient states she seen Charlotte Hall last week in regard to cough.  Patient states she still has cough and would like to know what Baldo Ash suggest to do.

## 2017-02-24 NOTE — Telephone Encounter (Signed)
Pt is aware.  

## 2017-02-25 ENCOUNTER — Encounter: Payer: Self-pay | Admitting: Family Medicine

## 2017-02-25 ENCOUNTER — Ambulatory Visit: Payer: Self-pay

## 2017-02-25 ENCOUNTER — Ambulatory Visit (INDEPENDENT_AMBULATORY_CARE_PROVIDER_SITE_OTHER): Payer: Medicare Other | Admitting: Family Medicine

## 2017-02-25 VITALS — BP 120/58 | HR 101 | Resp 16 | Wt 162.1 lb

## 2017-02-25 DIAGNOSIS — M1711 Unilateral primary osteoarthritis, right knee: Secondary | ICD-10-CM

## 2017-02-25 DIAGNOSIS — M79604 Pain in right leg: Secondary | ICD-10-CM

## 2017-02-25 DIAGNOSIS — D1723 Benign lipomatous neoplasm of skin and subcutaneous tissue of right leg: Secondary | ICD-10-CM | POA: Diagnosis not present

## 2017-02-25 NOTE — Assessment & Plan Note (Signed)
Stable overall. Discussed that we can repeat injections every 10-12 weeks if needed. Patient is a candidate for viscous supplementation if needed. Patient declined any type of brace today. We discussed which x-rays to do a which was to avoid. Follow-up again in 6 weeks

## 2017-02-25 NOTE — Patient Instructions (Signed)
Good to see you  Heidi Franklin is your friend.  Lipoma in the leg nothing seems to be of concern but we will watch it.  For the knees consider the other injections  Otherwise see me again in 6 weeks and we can do injections in the knees again.

## 2017-02-25 NOTE — Progress Notes (Signed)
[facility] [address] [place], [place] [address] Phone: [phone] Subjective:    I'm seeing this patient by the request  of:    CC: Right leg pain  [id number]  Heidi Franklin is a 71 y.o. female coming in with complaint of right leg pain. Patient does have known severe arthritic changes of the right knee Patient was cementing previously. Patient was given injections in the knees bilaterally. States that she is doing relatively well. States that she is about 40-50% better. Can do daily activities without it hurting as much.  Back pain seems to be fine at the moment. Feels like she is stable.  Patient does notice a mass on her lower right leg. Non-tender with palpating. She has noticed recently. Does not rib number any injury. Denies any fevers chills or any abnormal weight loss.    past imaging a patient back shows the patient does have mild to moderate degenerative changes of the lumbar spine but nothing specific. Seems to be worse at L4-L5. This was independently visualized by me.  Past Medical History:  Diagnosis Date  . ALLERGIC RHINITIS   . Allergy   . Anxiety state, unspecified   . Arthritis   . BRONCHITIS, CHRONIC   . DEPRESSION   . DIABETES MELLITUS, TYPE II   . GERD   . HYPERLIPIDEMIA   . HYPERTENSION   . Psoriasis    Past Surgical History:  Procedure Laterality Date  . ABDOMINAL HYSTERECTOMY  1980  . TRIGGER FINGER RELEASE     Index finger   Social History   Social History  . Marital status: Married    Spouse name: N/A  . Number of children: 0  . Years of education: 59yr colge   Occupational History  . Retired     Social History Main Topics  . Smoking status: Never Smoker  . Smokeless tobacco: Never Used  . Alcohol use No  . Drug use: No  . Sexual activity: No   Other Topics Concern  . None   Social History Narrative   Married, lives with spouse-retired from [employer] insurance   Not employed    Drinks coffee  occasional, Consumes 1 soda a day    Allergies  Allergen Reactions  . Metformin And Related Diarrhea  . Penicillins Rash   Family History  Problem Relation Age of Onset  . Stroke Mother   . Angina Mother   . Diabetes Father   . Hyperlipidemia Other     Parent  . Hypertension Other     Parent  . Diabetes Sister     x1  . CAD Sister   . Diabetes Brother     x 2  . Colon cancer Neg Hx   . Esophageal cancer Neg Hx   . Rectal cancer Neg Hx   . Stomach cancer Neg Hx   . Pancreatic cancer Neg Hx     Past medical history, social, surgical and family history all reviewed in electronic medical record.  No pertanent information unless stated regarding to the chief complaint.   Review of Systems: No headache, visual changes, nausea, vomiting, diarrhea, constipation, dizziness, abdominal pain, skin rash, fevers, chills, night sweats, weight loss, swollen lymph nodes, body aches, joint swelling, muscle aches, chest pain, shortness of breath, mood changes.    Objective  Blood pressure (!) 120/58, pulse (!) 101, resp. rate 16, weight 162 lb 2 oz (73.5 kg), SpO2 97 %.   Systems examined below as of  02/25/17 General: NAD A&O x3 mood, affect normal  HEENT: Pupils equal, extraocular movements intact no nystagmus Respiratory: not short of breath at rest or with speaking Cardiovascular: No lower extremity edema, non tender Skin: Warm dry intact with no signs of infection or rash on extremities or on axial skeleton. Abdomen: Soft nontender, no masses Neuro: Cranial nerves  intact, neurovascularly intact in all extremities with 2+ DTRs and 2+ pulses. Lymph: No lymphadenopathy appreciated today  Gait normal with good balance and coordination.  MSK: Non tender with full range of motion and good stability and symmetric strength and tone of shoulders, elbows, wrist,  knee hips and ankles bilaterally.   Back Exam:  Inspection: Unremarkable  Motion: Flexion 35 deg, Extension 25 deg, Side Bending  to 45 deg bilaterally,  Rotation to 35 deg bilaterally  SLR laying: Negative today which is an improvement XSLR laying: Negative  Palpable tenderness: Tender to palpation in the paraspinal musculature of the lumbar spine. FABER: Continued tightness Sensory change: Gross sensation intact to all lumbar and sacral dermatomes.  Reflexes: 2+ at both patellar tendons, 2+ at achilles tendons, Babinski's downgoing.  Strength at foot  Plantar-flexion: 5/5 Dorsi-flexion: 5/5 Eversion: 5/5 Inversion: 5/5  Leg strength  Quad: 5/5 Hamstring: 5/5 Hip flexor: 5/5 Hip abductors: 5/5   Knee: Right valgus deformity noted. Large thigh to calf ratio.  Tender to palpation over medial and PF joint line.  ROM full in flexion and extension and lower leg rotation. instability with valgus force.  painful patellar compression. Patellar glide with moderate crepitus. Patellar and quadriceps tendons unremarkable. Hamstring and quadriceps strength is normal. Contralateral knee shows arthritic changes but nontender Minimal change from previous exam  Patient's lower leg does have a soft palpable freely movable mass in the dermis on the lateral aspect of the ankle 4 cm proximal to the lateral malleolus.  Limited muscular skeletal ultrasound was performed and interpreted by Lyndal Pulley  Limited ultrasound the patient's meshes no increasing in Doppler flow. Seems to be in the dermis. Heterotropic changes that is consistent with lipoma Impression: Lipoma   Impression and Recommendations:     This case required medical decision making of moderate complexity.      Note: This dictation was prepared with Dragon dictation along with smaller phrase technology. Any transcriptional errors that result from this process are unintentional.

## 2017-02-25 NOTE — Assessment & Plan Note (Signed)
We will monitor. On ultrasound today no significant glaring problems.

## 2017-02-27 ENCOUNTER — Encounter: Payer: Self-pay | Admitting: Nurse Practitioner

## 2017-03-17 ENCOUNTER — Telehealth: Payer: Self-pay | Admitting: Nurse Practitioner

## 2017-03-17 NOTE — Telephone Encounter (Signed)
It has been almost 4weeks since she was evaluated for cough. She needs and OV.

## 2017-03-17 NOTE — Telephone Encounter (Signed)
Please help call pt.

## 2017-03-17 NOTE — Telephone Encounter (Signed)
Got patient scheduled.  Burns schedule is full for 5/22.  Scheduled with Baldo Ash for 5/22.

## 2017-03-17 NOTE — Telephone Encounter (Signed)
Please advise 

## 2017-03-17 NOTE — Telephone Encounter (Signed)
Patient would like to know if something else can be done about her cough she was seen for on 4/27.  States she had to get up out of church twice yesterday due to cough.  I notified patient that she should come back in for a follow up.  Patient did not want to come back in.  I told her I would send a message back to see if anything else can be done without her coming back in for evaluation again.

## 2017-03-18 ENCOUNTER — Encounter: Payer: Self-pay | Admitting: Nurse Practitioner

## 2017-03-18 ENCOUNTER — Ambulatory Visit (INDEPENDENT_AMBULATORY_CARE_PROVIDER_SITE_OTHER): Payer: Medicare Other | Admitting: Nurse Practitioner

## 2017-03-18 VITALS — BP 130/60 | HR 84 | Temp 98.5°F | Ht 60.0 in | Wt 161.0 lb

## 2017-03-18 DIAGNOSIS — R059 Cough, unspecified: Secondary | ICD-10-CM

## 2017-03-18 DIAGNOSIS — R05 Cough: Secondary | ICD-10-CM

## 2017-03-18 DIAGNOSIS — J310 Chronic rhinitis: Secondary | ICD-10-CM | POA: Diagnosis not present

## 2017-03-18 MED ORDER — BENZONATATE 100 MG PO CAPS: 100.0000 mg | ORAL_CAPSULE | Freq: Three times a day (TID) | ORAL | 0 refills | Status: DC | PRN

## 2017-03-18 MED ORDER — MONTELUKAST SODIUM 10 MG PO TABS: 10.0000 mg | ORAL_TABLET | Freq: Every day | ORAL | 3 refills | Status: DC

## 2017-03-18 MED ORDER — FLUTICASONE-UMECLIDIN-VILANT 100-62.5-25 MCG/INH IN AEPB: 1.0000 | INHALATION_SPRAY | Freq: Every day | RESPIRATORY_TRACT | 0 refills | Status: DC

## 2017-03-18 MED ORDER — ALBUTEROL SULFATE HFA 108 (90 BASE) MCG/ACT IN AERS: 1.0000 | INHALATION_SPRAY | Freq: Four times a day (QID) | RESPIRATORY_TRACT | 3 refills | Status: DC | PRN

## 2017-03-18 NOTE — Patient Instructions (Addendum)
You will be contacted with appt to ENT.  Rinse mouth after use of trelegy inhaler.  Cough, Adult Coughing is a reflex that clears your throat and your airways. Coughing helps to heal and protect your lungs. It is normal to cough occasionally, but a cough that happens with other symptoms or lasts a long time may be a sign of a condition that needs treatment. A cough may last only 2-3 weeks (acute), or it may last longer than 8 weeks (chronic). What are the causes? Coughing is commonly caused by:  Breathing in substances that irritate your lungs.  A viral or bacterial respiratory infection.  Allergies.  Asthma.  Postnasal drip.  Smoking.  Acid backing up from the stomach into the esophagus (gastroesophageal reflux).  Certain medicines.  Chronic lung problems, including COPD (or rarely, lung cancer).  Other medical conditions such as heart failure. Follow these instructions at home: Pay attention to any changes in your symptoms. Take these actions to help with your discomfort:  Take medicines only as told by your health care provider.  If you were prescribed an antibiotic medicine, take it as told by your health care provider. Do not stop taking the antibiotic even if you start to feel better.  Talk with your health care provider before you take a cough suppressant medicine.  Drink enough fluid to keep your urine clear or pale yellow.  If the air is dry, use a cold steam vaporizer or humidifier in your bedroom or your home to help loosen secretions.  Avoid anything that causes you to cough at work or at home.  If your cough is worse at night, try sleeping in a semi-upright position.  Avoid cigarette smoke. If you smoke, quit smoking. If you need help quitting, ask your health care provider.  Avoid caffeine.  Avoid alcohol.  Rest as needed. Contact a health care provider if:  You have new symptoms.  You cough up pus.  Your cough does not get better after 2-3  weeks, or your cough gets worse.  You cannot control your cough with suppressant medicines and you are losing sleep.  You develop pain that is getting worse or pain that is not controlled with pain medicines.  You have a fever.  You have unexplained weight loss.  You have night sweats. Get help right away if:  You cough up blood.  You have difficulty breathing.  Your heartbeat is very fast. This information is not intended to replace advice given to you by your health care provider. Make sure you discuss any questions you have with your health care provider. Document Released: 04/12/2011 Document Revised: 03/21/2016 Document Reviewed: 12/21/2014 Elsevier Interactive Patient Education  2017 [employer].

## 2017-03-18 NOTE — Progress Notes (Signed)
Subjective:  Patient ID: Heidi Franklin, female    DOB: 1945/11/05  Age: 71 y.o. MRN: [id number]  CC: Cough (still coughing. not better)   Cough  This is a recurrent problem. The current episode started more than 1 month ago. The problem has been waxing and waning. The problem occurs constantly. The cough is productive of sputum. Associated symptoms include nasal congestion, postnasal drip, rhinorrhea, a sore throat, shortness of breath and wheezing. Pertinent negatives include no chest pain, chills, ear congestion, ear pain, fever, headaches, heartburn, hemoptysis or myalgias. The symptoms are aggravated by lying down, cold air and pollens. She has tried a beta-agonist inhaler and prescription cough suppressant (depomedrol Im and oral abx) for the symptoms. The treatment provided mild relief. Her past medical history is significant for environmental allergies.  normal CXR FeNO done:11. She was evaluated by pulmonology 08/2012 to 10/2016: diagnosed with upper airway cough syndrome. PFT, RAST, and methcholine test done. Medications tried in past: singulair, and Neurontin. Medications were not taken consistently. Unsure if they were effective or not.  She states GERD symptoms are controlled with pepcid and prevacid.   Outpatient Medications Prior to Visit  Medication Sig Dispense Refill  . acetaminophen (TYLENOL) 325 MG tablet Take 650 mg by mouth every 6 (six) hours as needed.    . ALPRAZolam (XANAX) 0.5 MG tablet take 1 tablet by mouth every 8 hours if needed for anxiety 30 tablet 2  . amLODipine (NORVASC) 10 MG tablet TAKE 1 TABLET EVERY DAY 90 tablet 3  . aspirin 81 MG tablet Take 81 mg by mouth every morning.     . Calcium Citrate-Vitamin D (CALCIUM + D PO) Take 1 tablet by mouth every morning.    . chlorpheniramine (CHLOR-TRIMETON) 4 MG tablet Take 2 tabs by mouth at bedtime and every 4 hours as needed for drainage/drippy nose/sneezing    . famotidine (PEPCID) 20 MG tablet Take 1 tablet (20  mg total) by mouth at bedtime. 30 tablet 11  . fluocinonide ointment (LIDEX) 3.32 % Apply 1 application topically as needed.     . fluticasone (FLONASE) 50 MCG/ACT nasal spray USE 1 SPRAY IN EACH NOSTRIL TWICE DAILY 48 g 1  . glucose blood (ONE TOUCH ULTRA TEST) test strip 1 each by Other route daily. Use 1 strips to check blood sugar twice a day Ex E11.9 100 each 3  . glyBURIDE (DIABETA) 2.5 MG tablet Take 1 tablet (2.5 mg total) by mouth daily with breakfast. 90 tablet 1  . ibuprofen (ADVIL,MOTRIN) 600 MG tablet Take 1 tablet (600 mg total) by mouth every 6 (six) hours as needed. 30 tablet 0  . Lancets (ONETOUCH ULTRASOFT) lancets PRN 100 each 3  . lansoprazole (PREVACID) 30 MG capsule Take 1 capsule (30 mg total) by mouth daily before breakfast. 30 capsule 5  . lovastatin (MEVACOR) 20 MG tablet Take 1 tablet (20 mg total) by mouth at bedtime. 90 tablet 1  . mometasone (ELOCON) 0.1 % ointment     . Multiple Vitamins-Minerals (CENTRUM SILVER PO) Take 1 tablet by mouth every morning.    . naproxen (NAPROSYN) 250 MG tablet Take 1 tablet (250 mg total) by mouth 3 (three) times daily with meals. As needed for pain. 30 tablet 0  . omeprazole (PRILOSEC) 40 MG capsule Take 40 mg by mouth 1 day or 1 dose.    . potassium chloride SA (K-DUR,KLOR-CON) 20 MEQ tablet TAKE 1 TABLET (20 MEQ TOTAL) BY MOUTH DAILY. 90 tablet 3  . Respiratory  Therapy Supplies (FLUTTER) DEVI Use as directed 1 each 0  . sertraline (ZOLOFT) 100 MG tablet Take 1.5 tablets (150 mg total) by mouth every morning. 135 tablet 1  . valsartan-hydrochlorothiazide (DIOVAN HCT) 160-12.5 MG tablet Take 1 tablet by mouth daily. 90 tablet 1  . HYDROcodone-homatropine (HYCODAN) 5-1.5 MG/5ML syrup Take 5 mLs by mouth at bedtime as needed for cough. 120 mL 0  . VENTOLIN HFA 108 (90 Base) MCG/ACT inhaler inhale 2 puffs by mouth every 6 hours if needed for wheezing 18 Inhaler 3  . azithromycin (ZITHROMAX Z-PAK) 250 MG tablet Take 1 tablet (250 mg  total) by mouth daily. Take 2tabs on first day, then 1tab once a day till complete (Patient not taking: Reported on 03/18/2017) 6 tablet 0   No facility-administered medications prior to visit.     ROS See HPI  Objective:  BP 130/60   Pulse 84   Temp 98.5 F (36.9 C)   Ht 5' (1.524 m)   Wt 161 lb (73 kg)   SpO2 98%   BMI 31.44 kg/m   BP Readings from Last 3 Encounters:  03/18/17 130/60  02/25/17 (!) 120/58  02/21/17 120/60    Wt Readings from Last 3 Encounters:  03/18/17 161 lb (73 kg)  02/25/17 162 lb 2 oz (73.5 kg)  02/21/17 163 lb (73.9 kg)    Physical Exam  Constitutional: She is oriented to person, place, and time.  HENT:  Right Ear: Tympanic membrane, external ear and ear canal normal.  Left Ear: Tympanic membrane, external ear and ear canal normal.  Nose: Mucosal edema and rhinorrhea present. Right sinus exhibits no maxillary sinus tenderness and no frontal sinus tenderness. Left sinus exhibits no maxillary sinus tenderness and no frontal sinus tenderness.  Mouth/Throat: Uvula is midline. No trismus in the jaw. Posterior oropharyngeal erythema present. No oropharyngeal exudate.  Eyes: No scleral icterus.  Neck: Normal range of motion. Neck supple.  Cardiovascular: Normal rate and normal heart sounds.   Pulmonary/Chest: Effort normal and breath sounds normal.  Musculoskeletal: She exhibits no edema.  Lymphadenopathy:    She has no cervical adenopathy.  Neurological: She is alert and oriented to person, place, and time.  Skin: Skin is warm and dry.  Vitals reviewed.   Lab Results  Component Value Date   WBC 9.0 01/23/2017   HGB 11.8 (L) 01/23/2017   HCT 35.7 (L) 01/23/2017   PLT 294.0 01/23/2017   GLUCOSE 167 (H) 01/23/2017   CHOL 161 08/08/2016   TRIG 174.0 (H) 08/08/2016   HDL 33.20 (L) 08/08/2016   LDLDIRECT 155.1 02/09/2013   LDLCALC 93 08/08/2016   ALT 14 01/23/2017   AST 16 01/23/2017   NA 141 01/23/2017   K 3.7 01/23/2017   CL 104  01/23/2017   CREATININE 0.90 01/23/2017   BUN 18 01/23/2017   CO2 29 01/23/2017   TSH 3.90 08/08/2016   HGBA1C 6.5 02/05/2017   MICROALBUR 1.6 08/08/2016    Dg Chest 2 View  Result Date: 02/21/2017 CLINICAL DATA:  Dry cough for 3 days. EXAM: CHEST  2 VIEW COMPARISON:  12/15/2015 FINDINGS: Heart and mediastinal contours are within normal limits. No focal opacities or effusions. No acute bony abnormality. IMPRESSION: No active cardiopulmonary disease. Electronically Signed   By: Rolm Baptise M.D.   On: 02/21/2017 11:01    Assessment & Plan:   Sabryn was seen today for cough.  Diagnoses and all orders for this visit:  Chronic rhinitis -     montelukast (  SINGULAIR) 10 MG tablet; Take 1 tablet (10 mg total) by mouth at bedtime. -     albuterol (VENTOLIN HFA) 108 (90 Base) MCG/ACT inhaler; Inhale 1-2 puffs into the lungs every 6 (six) hours as needed for wheezing or shortness of breath. -     Ambulatory referral to ENT  Cough -     montelukast (SINGULAIR) 10 MG tablet; Take 1 tablet (10 mg total) by mouth at bedtime. -     albuterol (VENTOLIN HFA) 108 (90 Base) MCG/ACT inhaler; Inhale 1-2 puffs into the lungs every 6 (six) hours as needed for wheezing or shortness of breath. -     Fluticasone-Umeclidin-Vilant (TRELEGY ELLIPTA) 100-62.5-25 MCG/INH AEPB; Inhale 1 puff into the lungs daily. -     benzonatate (TESSALON) 100 MG capsule; Take 1 capsule (100 mg total) by mouth 3 (three) times daily as needed for cough.   I have discontinued Ms. Templin's HYDROcodone-homatropine and azithromycin. I have also changed her VENTOLIN HFA to albuterol. Additionally, I am having her start on montelukast, Fluticasone-Umeclidin-Vilant, and benzonatate. Lastly, I am having her maintain her aspirin, chlorpheniramine, Multiple Vitamins-Minerals (CENTRUM SILVER PO), famotidine, FLUTTER, Calcium Citrate-Vitamin D (CALCIUM + D PO), glucose blood, onetouch ultrasoft, potassium chloride SA, amLODipine, fluocinonide  ointment, mometasone, acetaminophen, ibuprofen, fluticasone, naproxen, valsartan-hydrochlorothiazide, lovastatin, glyBURIDE, lansoprazole, sertraline, ALPRAZolam, and omeprazole.  Meds ordered this encounter  Medications  . montelukast (SINGULAIR) 10 MG tablet    Sig: Take 1 tablet (10 mg total) by mouth at bedtime.    Dispense:  30 tablet    Refill:  3    Order Specific Question:   Supervising Provider    Answer:   Cassandria Anger [[id number]]  . albuterol (VENTOLIN HFA) 108 (90 Base) MCG/ACT inhaler    Sig: Inhale 1-2 puffs into the lungs every 6 (six) hours as needed for wheezing or shortness of breath.    Dispense:  1 Inhaler    Refill:  3    Order Specific Question:   Supervising Provider    Answer:   Cassandria Anger [[id number]]  . Fluticasone-Umeclidin-Vilant (TRELEGY ELLIPTA) 100-62.5-25 MCG/INH AEPB    Sig: Inhale 1 puff into the lungs daily.    Dispense:  1 each    Refill:  0    Order Specific Question:   Supervising Provider    Answer:   Cassandria Anger [[id number]]  . benzonatate (TESSALON) 100 MG capsule    Sig: Take 1 capsule (100 mg total) by mouth 3 (three) times daily as needed for cough.    Dispense:  20 capsule    Refill:  0    Order Specific Question:   Supervising Provider    Answer:   Cassandria Anger [[id number]]    Follow-up: Return if symptoms worsen or fail to improve.  Wilfred Lacy, NP

## 2017-04-08 ENCOUNTER — Encounter: Payer: Self-pay | Admitting: Family Medicine

## 2017-04-08 ENCOUNTER — Ambulatory Visit (INDEPENDENT_AMBULATORY_CARE_PROVIDER_SITE_OTHER): Payer: Medicare Other | Admitting: Family Medicine

## 2017-04-08 DIAGNOSIS — M1711 Unilateral primary osteoarthritis, right knee: Secondary | ICD-10-CM

## 2017-04-08 NOTE — Patient Instructions (Signed)
Good to see you  Overall I am happy  Keep it up  See me when you need me or can make an appointment in 3-4 weeks in case  pennsaid pinkie amount topically 2 times daily as needed.   Happy 4th!

## 2017-04-08 NOTE — Progress Notes (Signed)
[name] [place], [place] [address] Phone: [phone] Subjective:    I'm seeing this patient by the request  of:    CC: Right leg pain f/u  [id number]  Heidi Franklin is a 71 y.o. female coming in with complaint of right leg pain. Patient does have known severe arthritic changes of the right knee .  Last injection 3/29, Was having worsening pain at last exam but seems to be doing better today. Patient states and not very severe. Wants to continue to monitor.   Back pain seems to be fine at the moment. Feels like she is stable. No changes. No radicular symptoms.  Patient does notice a mass on her lower right leg. Found to have a lipoma. Doing much better. No pain.      Past Medical History:  Diagnosis Date  . ALLERGIC RHINITIS   . Allergy   . Anxiety state, unspecified   . Arthritis   . BRONCHITIS, CHRONIC   . DEPRESSION   . DIABETES MELLITUS, TYPE II   . GERD   . HYPERLIPIDEMIA   . HYPERTENSION   . Psoriasis    Past Surgical History:  Procedure Laterality Date  . ABDOMINAL HYSTERECTOMY  1980  . TRIGGER FINGER RELEASE     Index finger   Social History   Social History  . Marital status: Married    Spouse name: N/A  . Number of children: 0  . Years of education: 16yr colge   Occupational History  . Retired     Social History Main Topics  . Smoking status: Never Smoker  . Smokeless tobacco: Never Used  . Alcohol use No  . Drug use: No  . Sexual activity: No   Other Topics Concern  . None   Social History Narrative   Married, lives with spouse-retired from [employer] insurance   Not employed    Drinks coffee occasional, Consumes 1 soda a day    Allergies  Allergen Reactions  . Metformin And Related Diarrhea  . Penicillins Rash   Family History  Problem Relation Age of Onset  . Stroke Mother   . Angina Mother   . Diabetes Father   . Hyperlipidemia Other        Parent  . Hypertension Other        Parent   . Diabetes Sister        x1  . CAD Sister   . Diabetes Brother        x 2  . Colon cancer Neg Hx   . Esophageal cancer Neg Hx   . Rectal cancer Neg Hx   . Stomach cancer Neg Hx   . Pancreatic cancer Neg Hx     Past medical history, social, surgical and family history all reviewed in electronic medical record.  No pertanent information unless stated regarding to the chief complaint.   Review of Systems: No headache, visual changes, nausea, vomiting, diarrhea, constipation, dizziness, abdominal pain, skin rash, fevers, chills, night sweats, weight loss, swollen lymph nodes, body aches, joint swelling, chest pain, shortness of breath, mood changes.  Positive muscle aches  Objective  Blood pressure 124/62, pulse 92, height 5' (1.524 m), weight 163 lb (73.9 kg), SpO2 99 %.   Systems examined below as of 04/08/17 General: NAD A&O x3 mood, affect normal  HEENT: Pupils equal, extraocular movements intact no nystagmus Respiratory: not short of breath at rest or with speaking Cardiovascular: No lower extremity edema, non tender  Skin: Warm dry intact with no signs of infection or rash on extremities or on axial skeleton. Abdomen: Soft nontender, no masses Neuro: Cranial nerves  intact, neurovascularly intact in all extremities with 2+ DTRs and 2+ pulses. Lymph: No lymphadenopathy appreciated today  Gait normal with good balance and coordination.  MSK: Non tender with full range of motion and good stability and symmetric strength and tone of shoulders, elbows, wrist,  hips and ankles bilaterally.  Arthritic changes of multiple joints Back Exam:  Inspection: Mild loss of lordosis Motion: Flexion 35 deg, Extension 25 deg, Side Bending to 45 deg bilaterally,  Rotation to 35 deg bilaterally  SLR laying: Negative  XSLR laying: Negative  Palpable tenderness: Tenderness in the paraspinal musculature lumbar spine FABER: tightness bilaterally Sensory change: Gross sensation intact to all lumbar  and sacral dermatomes.  Reflexes: 2+ at both patellar tendons, 2+ at achilles tendons, Babinski's downgoing.  Strength at foot  Plantar-flexion: 5/5 Dorsi-flexion: 5/5 Eversion: 5/5 Inversion: 5/5  Leg strength  Quad: 5/5 Hamstring: 5/5 Hip flexor: 5/5 Hip abductors: 5/5   Knee: Right valgus deformity noted.  Tender to palpation over medial and PF joint line.  ROM full in flexion and extension and lower leg rotation. instability with valgus force.  Mild painful patellar compression. Patellar glide with mild crepitus. Patellar and quadriceps tendons unremarkable. Hamstring and quadriceps strength is normal. Contralateral knee shows mild crepitus but no pain     Impression and Recommendations:     This case required medical decision making of moderate complexity.      Note: This dictation was prepared with Dragon dictation along with smaller phrase technology. Any transcriptional errors that result from this process are unintentional.

## 2017-04-08 NOTE — Assessment & Plan Note (Signed)
Stable. No injections given today. She'll continue conservative therapy and see me as needed

## 2017-04-12 ENCOUNTER — Encounter: Payer: Self-pay | Admitting: Nurse Practitioner

## 2017-04-14 NOTE — Telephone Encounter (Signed)
Please advise 

## 2017-04-23 ENCOUNTER — Telehealth: Payer: Self-pay | Admitting: Internal Medicine

## 2017-04-23 DIAGNOSIS — K219 Gastro-esophageal reflux disease without esophagitis: Secondary | ICD-10-CM | POA: Diagnosis not present

## 2017-04-23 DIAGNOSIS — J040 Acute laryngitis: Secondary | ICD-10-CM | POA: Diagnosis not present

## 2017-04-23 DIAGNOSIS — R05 Cough: Secondary | ICD-10-CM | POA: Diagnosis not present

## 2017-04-23 NOTE — Telephone Encounter (Signed)
Please advise 

## 2017-04-23 NOTE — Telephone Encounter (Signed)
You need to clarify question about inhaler.

## 2017-04-23 NOTE — Telephone Encounter (Signed)
Patient states she was referred to ENT by [facility] for cough.  Patient states she had this appt today.  States that ENT did some test but ultimately thinks there is nothing he could do.  Did suggest GI for acid reflux to see if this is what could be causing cough.  Would like to know what Baldo Ash suggest as next step. Also, states needs call in regard to how to use inhalers Baldo Ash has prescribed.

## 2017-04-24 MED ORDER — OMEPRAZOLE 40 MG PO CPDR: 40.0000 mg | DELAYED_RELEASE_CAPSULE | Freq: Every day | ORAL | 3 refills | Status: DC

## 2017-04-24 NOTE — Telephone Encounter (Signed)
Follow up with pulmonology about continuation of ellipta inhaler.  She is currently taking omeprazole once a day in morning. She should continue that. She can add zantac or ranitidine 150mg  at bedtime. This is over the counter.

## 2017-04-24 NOTE — Telephone Encounter (Signed)
Spoke with pt, she said the ENT doctor said this coughing might be coming from GI, not coming from the throat. So pt was wondering what Heidi Franklin wants to do because she still have cough (hard to stop when its start).   Pt finish sample of the Ellipta that you gave her, she was wondering if she need to continue or stop taking it? Please advise.

## 2017-04-24 NOTE — Telephone Encounter (Signed)
Left vm for pt to call back, need to inform massage below and Omeprazole sent to [facility] aid as well.

## 2017-04-25 ENCOUNTER — Ambulatory Visit (INDEPENDENT_AMBULATORY_CARE_PROVIDER_SITE_OTHER): Payer: Medicare Other | Admitting: Internal Medicine

## 2017-04-25 ENCOUNTER — Encounter: Payer: Self-pay | Admitting: Internal Medicine

## 2017-04-25 VITALS — BP 136/78 | HR 93 | Ht 60.0 in | Wt 162.4 lb

## 2017-04-25 DIAGNOSIS — J387 Other diseases of larynx: Secondary | ICD-10-CM

## 2017-04-25 DIAGNOSIS — R05 Cough: Secondary | ICD-10-CM | POA: Diagnosis not present

## 2017-04-25 DIAGNOSIS — R053 Chronic cough: Secondary | ICD-10-CM

## 2017-04-25 LAB — NITRIC OXIDE: Nitric Oxide: 15

## 2017-04-25 MED ORDER — PREDNISONE 10 MG PO TABS: ORAL_TABLET | ORAL | 0 refills | Status: DC

## 2017-04-25 NOTE — Progress Notes (Signed)
Subjective:     Patient ID: Heidi Franklin, female   DOB: [birth year], 71 y.o.   MRN: [id number]   PCP Binnie Rail, MD   HPI  Brief patient profile:  40  yobf never smoker with chronic cough  since age 53's only resolves for a week or two  at a time then recurs daily,  dx as  post-nasal drip with nasal septal deviation, and GERD with neg MCT 11/2013   Workup for cough : CT sinus 11/08/11>>Negative paranasal sinuses.  Rightward nasal septal deviation and possible rhinitis. 01/13/12>>  RAST negative, IgE 22.7 PFT 11/07/11>>FEV1 1.79 (102%),  Ratio 82, TLC 4.18 (104%), DLCO 69%, positive BD only in terms of fef 25/75 -med calendar 11/23/2012 , 03/18/2013 , 08/26/2013 , 12/06/2013, 06/21/2014 > not keeping up with it  -methacholine challenge test  12/24/13 > neg  - try off singulair 12/31/2013 > no worse  - added neurontin 100 mg qid  05/20/14 but did not take consistently as of 06/30/2014 > increased to 100 qid 08/23/2014 improved then stopped it and flared spirng of 2016  - 07/05/2015 increase neurontin 300 tid and added elavil 10 mg at hs > not clear she did this at f/u ov 08/03/2015  - added flutter valve 08/03/2015  -08/2015 neurontin /tramadol stopped due to falls.      History of Present Illness  08/03/2012 f/u ov/Wert cc cough for decades, better on present rx than for many years prior to pulmonary clinic changes, not using calendar, poor hfa  Technique but no limiting sob.  rec Change zyrtec to where you take it only if needed for itching/ sneezing  Work on inhaler technique:      Ok to use symbicort with spacer if not improving     12/06/2013 NP Follow up and Med review  Patient returns for a followup visit and medication review. We reviewed all her medications organized them into a medication calendar with patient education. Appears the patient is taking her medications correctly. Since last visit. Patient reports that she is doing better. Cough is not as bad. Last ov Symbicort was changed to  As needed  Use only.  She is using on average every other day. Not sure if she needs or not.  No fever, chest pain , edema or wheezing. No xopenex use since last ov.  rec MCT > neg > d/c symbicort       12/04/2015  f/u ov/Wert re:  Cough x 50 years  Chief Complaint  Patient presents with  . Follow-up    Cough has improved some. No new co's today.   chlorpheniramine best way to stop the tickle  Only uses it maybe once a day  Very rarely use saba now >chlor tabs   12/15/2015 Acute OV  Pt presents for an acute office visit. Complains of chest tightness, sinus drainage, SOB, dry cough starting on 12/09/15. Denies any chest congestion, sinus pressure, fever, nausea or vomiting. Currently taking a zpak and pred taper given by MW on 12/12/15. CXR today shows bronchitic changes  Says she ran out of hydromet , helped some. Wants refill.  Denies chest pain, orthopnea, edema or fever.  rec Finish Prednisone and Zpack .  Delsym 2 tsp Twice daily  As needed  Cough  Chlortrimeton 4mg  1 every 4hrs as needed for drainage.  Mucinex Twice daily  As needed  Congestion .  Hydromet 1 tsp every 6hr as needed. - may make you sleepy.  Please contact office for sooner  follow up if symptoms do not improve or worsen or seek emergency care    NP acute ov  02/05/16 We will call in a prednisone taper Prednisone taper; 10 mg tablets: 4 tabs x 2 days, 3 tabs x 2 days, 2 tabs x 2 days 1 tab x 2 days then stop. Zpack Use the hydromet cough syrup 1 teaspoon for sleep at night every 4-6 hours.   02/23/2016  f/u ov/Wert re: cough x 50 y c/w uacs/  Neg MCT   Chief Complaint  Patient presents with  . Follow-up    Breathing has returned to baseline. She is coughing much less. She has not needed albuterol.   wakes up once or twice a twice weekly coughing but not needing saba at all  Still doe= MMRC2 = can't walk a nl pace on a flat grade s sob but does fine slow and flat eg [employer] shopping  >>no change   06/11/2016  NP Follow-up Chronic Cough /Neg MCT  Patient presents for a four-month follow-up and medication review. She says that she/he been doing well. She's had no flare of her cough. rec No change in medications   11/04/2016  f/u ov/Wert re: cough x 50 y better on no maint rx  Chief Complaint  Patient presents with  . Follow-up    Breathing is overall doing well. She has occ cough, non prod. She had renal CT done 10/14/17 that showed MPN. She is using ventolin HFA 1 x per wk on average.      Doe = MMRC1 = can walk nl pace, flat grade, can't hurry or go uphills or steps s sob    No obvious day to day or daytime variability or assoc excess/ purulent sputum or mucus plugs or hemoptysis or cp or chest tightness, subjective wheeze or overt sinus or hb symptoms. No unusual exp hx or h/o childhood pna/ asthma or knowledge of premature birth.  Sleeping ok without nocturnal  or early am exacerbation  of respiratory  c/o's or need for noct saba. Also denies any obvious fluctuation of symptoms with weather or environmental changes or other aggravating or alleviating factors except as outlined above   Acute OV 04/25/2017  Chief Complaint  Patient presents with  . Pulmonary Consult    MW pt. Pt c/o increase in cough with little mucus production - clear in color. Pt c/o DOE. Pt denies CP/tightness an f/c/s.     71 year old female suffers from chronic refractory cough as per review of the chart. She follows with Dr. Legrand Como wert. She's had cough for 50 years. At this point in time she is here for an acute flareup of chronic cough. She says that one month ago she suffered from a cold and since then the cough is worse than baseline. It is dry, laryngeal and quality. Moderate in intensity. Clears her throat a lot. No sputum production or shortness of breath no wheezing she is frustrated by this. She's had extensive workup so far the cough is refractory.  Feno 15ppb today and is normal     CXR April  2018 CLINICAL DATA:  Dry cough for 3 days.  EXAM: CHEST  2 VIEW  COMPARISON:  12/15/2015  FINDINGS: Heart and mediastinal contours are within normal limits. No focal opacities or effusions. No acute bony abnormality.  IMPRESSION: No active cardiopulmonary disease.   Electronically Signed   By: Rolm Baptise M.D.   On: 02/21/2017 11:01   has a past medical history of ALLERGIC  RHINITIS; Allergy; Anxiety state, unspecified; Arthritis; BRONCHITIS, CHRONIC; DEPRESSION; DIABETES MELLITUS, TYPE II; GERD; HYPERLIPIDEMIA; HYPERTENSION; and Psoriasis.   reports that she has never smoked. She has never used smokeless tobacco.  Past Surgical History:  Procedure Laterality Date  . ABDOMINAL HYSTERECTOMY  1980  . TRIGGER FINGER RELEASE     Index finger    Allergies  Allergen Reactions  . Metformin And Related Diarrhea  . Penicillins Rash    Immunization History  Administered Date(s) Administered  . Influenza Split 08/20/2011, 07/30/2012, 06/28/2014  . Influenza Whole 07/28/2009, 07/06/2010  . Influenza,inj,Quad PF,36+ Mos 06/23/2013, 07/05/2015, 06/20/2016  . Influenza-Unspecified 06/28/2014  . Pneumococcal Conjugate-13 06/15/2015  . Pneumococcal Polysaccharide-23 10/02/2011  . Td 11/23/2013    Family History  Problem Relation Age of Onset  . Stroke Mother   . Angina Mother   . Diabetes Father   . Hyperlipidemia Other        Parent  . Hypertension Other        Parent  . Diabetes Sister        x1  . CAD Sister   . Diabetes Brother        x 2  . Colon cancer Neg Hx   . Esophageal cancer Neg Hx   . Rectal cancer Neg Hx   . Stomach cancer Neg Hx   . Pancreatic cancer Neg Hx      Current Outpatient Prescriptions:  .  acetaminophen (TYLENOL) 325 MG tablet, Take 650 mg by mouth every 6 (six) hours as needed., Disp: , Rfl:  .  albuterol (VENTOLIN HFA) 108 (90 Base) MCG/ACT inhaler, Inhale 1-2 puffs into the lungs every 6 (six) hours as needed for wheezing or  shortness of breath., Disp: 1 Inhaler, Rfl: 3 .  ALPRAZolam (XANAX) 0.5 MG tablet, take 1 tablet by mouth every 8 hours if needed for anxiety, Disp: 30 tablet, Rfl: 2 .  amLODipine (NORVASC) 10 MG tablet, TAKE 1 TABLET EVERY DAY, Disp: 90 tablet, Rfl: 3 .  aspirin 81 MG tablet, Take 81 mg by mouth every morning. , Disp: , Rfl:  .  Calcium Citrate-Vitamin D (CALCIUM + D PO), Take 1 tablet by mouth every morning., Disp: , Rfl:  .  chlorpheniramine (CHLOR-TRIMETON) 4 MG tablet, Take 2 tabs by mouth at bedtime and every 4 hours as needed for drainage/drippy nose/sneezing, Disp: , Rfl:  .  famotidine (PEPCID) 20 MG tablet, Take 1 tablet (20 mg total) by mouth at bedtime., Disp: 30 tablet, Rfl: 11 .  fluocinonide ointment (LIDEX) 2.94 %, Apply 1 application topically as needed. , Disp: , Rfl:  .  fluticasone (FLONASE) 50 MCG/ACT nasal spray, USE 1 SPRAY IN EACH NOSTRIL TWICE DAILY, Disp: 48 g, Rfl: 1 .  glucose blood (ONE TOUCH ULTRA TEST) test strip, 1 each by Other route daily. Use 1 strips to check blood sugar twice a day Ex E11.9, Disp: 100 each, Rfl: 3 .  glyBURIDE (DIABETA) 2.5 MG tablet, Take 1 tablet (2.5 mg total) by mouth daily with breakfast., Disp: 90 tablet, Rfl: 1 .  ibuprofen (ADVIL,MOTRIN) 600 MG tablet, Take 1 tablet (600 mg total) by mouth every 6 (six) hours as needed., Disp: 30 tablet, Rfl: 0 .  Lancets (ONETOUCH ULTRASOFT) lancets, PRN, Disp: 100 each, Rfl: 3 .  lansoprazole (PREVACID) 30 MG capsule, Take 1 capsule (30 mg total) by mouth daily before breakfast., Disp: 30 capsule, Rfl: 5 .  lovastatin (MEVACOR) 20 MG tablet, Take 1 tablet (20 mg total) by mouth  at bedtime., Disp: 90 tablet, Rfl: 1 .  mometasone (ELOCON) 0.1 % ointment, , Disp: , Rfl:  .  montelukast (SINGULAIR) 10 MG tablet, Take 1 tablet (10 mg total) by mouth at bedtime., Disp: 30 tablet, Rfl: 3 .  Multiple Vitamins-Minerals (CENTRUM SILVER PO), Take 1 tablet by mouth every morning., Disp: , Rfl:  .  naproxen  (NAPROSYN) 250 MG tablet, Take 1 tablet (250 mg total) by mouth 3 (three) times daily with meals. As needed for pain., Disp: 30 tablet, Rfl: 0 .  omeprazole (PRILOSEC) 40 MG capsule, Take 1 capsule (40 mg total) by mouth daily before breakfast., Disp: 30 capsule, Rfl: 3 .  potassium chloride SA (K-DUR,KLOR-CON) 20 MEQ tablet, TAKE 1 TABLET (20 MEQ TOTAL) BY MOUTH DAILY., Disp: 90 tablet, Rfl: 3 .  Respiratory Therapy Supplies (FLUTTER) DEVI, Use as directed, Disp: 1 each, Rfl: 0 .  sertraline (ZOLOFT) 100 MG tablet, Take 1.5 tablets (150 mg total) by mouth every morning., Disp: 135 tablet, Rfl: 1 .  valsartan-hydrochlorothiazide (DIOVAN HCT) 160-12.5 MG tablet, Take 1 tablet by mouth daily., Disp: 90 tablet, Rfl: 1 .  Fluticasone-Umeclidin-Vilant (TRELEGY ELLIPTA) 100-62.5-25 MCG/INH AEPB, Inhale 1 puff into the lungs daily. (Patient not taking: Reported on 04/25/2017), Disp: 1 each, Rfl: 0    Review of Systems     Objective:   Physical Exam  Constitutional: She is oriented to person, place, and time. She appears well-developed and well-nourished. No distress.  HENT:  Head: Normocephalic and atraumatic.  Right Ear: External ear normal.  Left Ear: External ear normal.  Mouth/Throat: Oropharynx is clear and moist. No oropharyngeal exudate.  Eyes: Conjunctivae and EOM are normal. Pupils are equal, round, and reactive to light. Right eye exhibits no discharge. Left eye exhibits no discharge. No scleral icterus.  Neck: Normal range of motion. Neck supple. No JVD present. No tracheal deviation present. No thyromegaly present.  Cardiovascular: Normal rate, regular rhythm, normal heart sounds and intact distal pulses.  Exam reveals no gallop and no friction rub.   No murmur heard. Pulmonary/Chest: Effort normal and breath sounds normal. No respiratory distress. She has no wheezes. She has no rales. She exhibits no tenderness.  Abdominal: Soft. Bowel sounds are normal. She exhibits no distension and  no mass. There is no tenderness. There is no rebound and no guarding.  Musculoskeletal: Normal range of motion. She exhibits no edema or tenderness.  Lymphadenopathy:    She has no cervical adenopathy.  Neurological: She is alert and oriented to person, place, and time. She has normal reflexes. No cranial nerve deficit. She exhibits normal muscle tone. Coordination normal.  Skin: Skin is warm and dry. No rash noted. She is not diaphoretic. No erythema. No pallor.  Psychiatric: She has a normal mood and affect. Her behavior is normal. Judgment and thought content normal.  Vitals reviewed.  Vitals:   04/25/17 1134  BP: 136/78  Pulse: 93  SpO2: 99%  Weight: 162 lb 6.4 oz (73.7 kg)  Height: 5' (1.524 m)    Estimated body mass index is 31.72 kg/m as calculated from the following:   Height as of this encounter: 5' (1.524 m).   Weight as of this encounter: 162 lb 6.4 oz (73.7 kg).     Assessment:       ICD-10-CM   1. Chronic cough R05 Nitric oxide  2. Irritable larynx syndrome J38.7    Discussed cough neuropathy or chronic refractory cough in detail    Plan:     .  You have , Cough neuropathy or irritable larynx syndrome or chronic refractory cough Acute flare up due cold episode 1 month ago that flared up. Nerve fibers  Plan - Please take prednisone 40 mg x1 day, then 30 mg x1 day, then 20 mg x1 day, then 10 mg x1 day, and then 5 mg x1 day and stop - HRCT supine and prone to rule out interstitial lung disease - Otherwise you've been worked up extensively  - This kind of cough will never go away unless we cool nerve fibers down.   -Gabapentin has worked for this but unfortunately had side effects - My recommendation for long-term is to participate in  cough research protocol   -This is purely voluntary and the goal is to help drug development  - Will refer you to [employer] our research partner for consideration  Followup  - Dr Melvyn Novas  As before 8-12 weeks   > 50% of this > 25  min visit spent in face to face counseling or coordination of care    Dr. Brand Males, M.D., [facility].C.P Pulmonary and Critical Care Medicine Staff Physician [place] [facility] Pager: [phone], If no answer or between  15:00h - 7:00h: call [phone]  04/25/2017 12:20 PM

## 2017-04-25 NOTE — Telephone Encounter (Signed)
Pt called, please return call.    

## 2017-04-25 NOTE — Patient Instructions (Signed)
ICD-10-CM   1. Chronic cough R05 Nitric oxide  2. Irritable larynx syndrome J38.7    You have , Cough neuropathy or irritable larynx syndrome or chronic refractory cough Acute flare up due cold episode 1 month ago that flared up. Nerve fibers  Plan - Please take prednisone 40 mg x1 day, then 30 mg x1 day, then 20 mg x1 day, then 10 mg x1 day, and then 5 mg x1 day and stop - HRCT supine and prone to rule out interstitial lung disease - Otherwise you've been worked up extensively  - This kind of cough will never go away unless we cool nerve fibers down.   -Gabapentin has worked for this but unfortunately had side effects - My recommendation for long-term is to participate in  cough research protocol   -This is purely voluntary and the goal is to help drug development  - Will refer you to [employer] our research partner for consideration  Followup  - Dr Melvyn Novas  As before 8-12 weeks

## 2017-04-25 NOTE — Telephone Encounter (Signed)
Pt verbalized understand of medications directions and she will call pulmonary to follow up.   She said she already taking Omeprazole twice a day in the morning and pepcid at bed times.

## 2017-05-01 ENCOUNTER — Ambulatory Visit (INDEPENDENT_AMBULATORY_CARE_PROVIDER_SITE_OTHER): Payer: Medicare Other

## 2017-05-01 DIAGNOSIS — J479 Bronchiectasis, uncomplicated: Secondary | ICD-10-CM | POA: Diagnosis not present

## 2017-05-01 DIAGNOSIS — R918 Other nonspecific abnormal finding of lung field: Secondary | ICD-10-CM

## 2017-05-01 DIAGNOSIS — R05 Cough: Secondary | ICD-10-CM

## 2017-05-01 DIAGNOSIS — I700 Atherosclerosis of aorta: Secondary | ICD-10-CM

## 2017-05-01 DIAGNOSIS — R053 Chronic cough: Secondary | ICD-10-CM

## 2017-05-07 ENCOUNTER — Other Ambulatory Visit: Payer: Self-pay | Admitting: Internal Medicine

## 2017-05-08 ENCOUNTER — Telehealth: Payer: Self-pay | Admitting: Internal Medicine

## 2017-05-08 NOTE — Telephone Encounter (Signed)
Wert patient with chronic refractory cough. Seveal findings on CT. None of this I think conributes to cough. She can either wait till august to see MW or can make interim appt to see APP to discuss CT results    Dr. Brand Males, M.D., [facility].C.P Pulmonary and Critical Care Medicine Staff Physician [place] [facility] Pager: [phone], If no answer or between  15:00h - 7:00h: call [phone]  05/08/2017 7:59 AM  IMPRESSION: 1. No evidence of interstitial lung disease. 2. Very mild cylindrical bronchiectasis in the the lower lobes of the lungs bilaterally. 3. Scattered tiny pulmonary nodules measuring 4 mm or less in size in the lungs bilaterally. These are nonspecific, but statistically likely benign. No follow-up needed if patient is low-risk (and has no known or suspected primary neoplasm). Non-contrast chest CT can be considered in 12 months if patient is high-risk. This recommendation follows the consensus statement: Guidelines for Management of Incidental Pulmonary Nodules Detected on CT Images: From the Fleischner Society 2017; Radiology 2017; 284:228-243. 4. Aortic atherosclerosis, in addition to left main and 3 vessel coronary artery disease. Assessment for potential risk factor modification, dietary therapy or pharmacologic therapy may be warranted, if clinically indicated. 5. There are calcifications of the aortic valve. Echocardiographic correlation for evaluation of potential valvular dysfunction may be warranted if clinically indicated.  Aortic Atherosclerosis (ICD10-I70.0).   Electronically Signed

## 2017-05-09 NOTE — Telephone Encounter (Signed)
Called and spoke to pt. Informed her of the results and recs per MR. Pt states she will wait till her appt with MW. Nothing further needed at this time.

## 2017-05-12 ENCOUNTER — Encounter: Payer: Self-pay | Admitting: Internal Medicine

## 2017-05-14 ENCOUNTER — Other Ambulatory Visit: Payer: Self-pay | Admitting: Internal Medicine

## 2017-05-14 NOTE — Telephone Encounter (Signed)
Patient wanted to inform she called the pharmacy. The medication has been recalled. She would like to know if you are going to send something different. And what they may be. Please follow up with patient. Thank you.

## 2017-05-15 NOTE — Addendum Note (Signed)
Addended by: Binnie Rail on: 05/15/2017 04:58 PM   Modules accepted: Orders

## 2017-05-15 NOTE — Telephone Encounter (Signed)
Medication pending - confirm what pharmacy she wants and send.  Let her know.,

## 2017-05-15 NOTE — Telephone Encounter (Signed)
Pt called in again regarding this, she would like to know what is going to be call in in place of the Valsartan.

## 2017-05-16 ENCOUNTER — Other Ambulatory Visit: Payer: Self-pay | Admitting: Emergency Medicine

## 2017-05-16 MED ORDER — LOSARTAN POTASSIUM-HCTZ 100-12.5 MG PO TABS: 1.0000 | ORAL_TABLET | Freq: Every day | ORAL | 1 refills | Status: DC

## 2017-05-16 NOTE — Telephone Encounter (Signed)
Pharmacy pt uses is [facility] in [place] on [address]

## 2017-05-16 NOTE — Telephone Encounter (Signed)
RX Sent. 

## 2017-05-22 ENCOUNTER — Encounter: Payer: Self-pay | Admitting: Internal Medicine

## 2017-05-23 NOTE — Telephone Encounter (Signed)
Pt called checking on this also.

## 2017-05-23 NOTE — Telephone Encounter (Signed)
Notified pt MD is out of office today, but I will forward her email to her for her advisement. Pt denies any side effects w/new med she did stated she felt more sleepy after starting not sure if med cause sleepiness. Inform pt I will give her a call back, or MD may send direct msg to her via mychart w/her recommendations!Marland Kitchen..lmb

## 2017-05-28 MED ORDER — AMLODIPINE BESYLATE 10 MG PO TABS: 5.0000 mg | ORAL_TABLET | Freq: Every day | ORAL | 0 refills | Status: DC

## 2017-05-28 NOTE — Addendum Note (Signed)
Addended by: Binnie Rail on: 05/28/2017 04:43 PM   Modules accepted: Orders

## 2017-06-03 DIAGNOSIS — B009 Herpesviral infection, unspecified: Secondary | ICD-10-CM | POA: Diagnosis not present

## 2017-06-03 DIAGNOSIS — D225 Melanocytic nevi of trunk: Secondary | ICD-10-CM | POA: Diagnosis not present

## 2017-06-03 DIAGNOSIS — L400 Psoriasis vulgaris: Secondary | ICD-10-CM | POA: Diagnosis not present

## 2017-06-04 NOTE — Progress Notes (Signed)
Title: A Double- Blind, Randomized, Placebo Controlled Study of the Efficacy and Safety of Three Doses of Orvepitant in Subjects With Chronic Refractory Cough Sponsor: [employer]; Protocol Number: VOLCANO-2;  NCT Trial #: [id number]; Phase of Development: Phase 2  Synopsis: Randomized subjects 1:1:1:1 entering a three week screening period and randomized at baseline/day 1 and followed throughout a 12 week double blind dosing. Doses of orvepitant (10mg/day, 20mg/day and 30mg/day) or a placebo will be administered and evaluated in the reduction of awake objective cough frequency  Key Inclusion Criteria:  Awake cough frequency of greater than/equal to 10 coughs/hour Female/Female subjects greater than/equal to 18 yrs. Diagnosis of CRC/unexplained cough for at least 1 year Non pregnant/lactating females Two forms of birth control- for female and female subjects  Key Exclusion Criteria: Current smokers or ex smokers within 6 months Recent respiratory tract infection Malignancy in past 5 years except if in remission or skin cancers unless approved by sponsor Treatment with Angiotensin Converting Enzyme inhibitors within 3 months of screening History of drug/alcohol abuse within 12 months of screening Hx of cystic fibrosis,idiopathic pulmonary fibrosis, moderate-severe asthma, bronchiectasis, COPD, CHF Significant or unstable psychiatric condition Renal transplant, current dialysis, or Hx of renal tubular acidosis -No GFR/Cr cut off noted Uncontrolled hypertension at screening Significant neurological disorder- **prior PMH or increased risk of seizures (not febrile), or recent (within 6 months) of head trauma or loss of consciousness or concussion Recent myocardial infarction (within 1 year of screening) drug use taken solely for treatment of cough prohibited from at least 1 month prior to screening) Concomitant respiratory medication is allowed, but subjects must be stable on medication  and take it for the duration of the study IMPORTANT MEDICATIONS PATIENT CAN NOT BE ON: grapefruit juice, St. John's Wort, digoxin, diltiazem, fluconazole, verapamil, ciprofloaxcin, amiodarone, carvedilol, azithromycin ---> All from Screening until 1 week after LAST dose  Key End Points: Change from baseline to week 12 in awake objective cough frequency measured with an automated cough monitor (Vitalojak) using 10mg, 20mg, 30mg orvepitant versus placebo  Key features of Volcano 2: Orvepitant is a centrally active, highly potent and selective NK(neurokinin)-1 receptor antagonist, recognized of its potential therapy for refractory cough Orvepitant was originally investigated for Major Depressive Disorder and PTSD Half Life of 7-15 hrs after single dose and increases to 34 hours after repeated dosing  Safety of Volcano 2 based on IB Version 6.0 and Date 09 Mar 2018__: Orvepitant has been administered in phase 2 studies to 557 patients to date- generally safe and well tolerated  A top dose of 30mg has been chosen based on efficacy/safety in Volcano 1 Monitored with assessments via cough monitor, lab work (no fasting needed), EKG, cough questionnaires, spirometry, physical exams, vital signs and adverse event reporting Fatigue, Dizziness, dry mouth, nausea and palpitations were most common AEs reported As of 03 Jan 2017 one death has occured- not considered related to study drug (pg110 in IB) ?  Study [id number]: Adverse Events Reported of Subjects in Any Treatment Group (Preferred Terms) Table 5.4-4,5 in IB   Placebo Orevipitant 10mg        30mg          60mg  > 5% side effect    Headache 9% 0%            9%             9%  Diarrhea 5%  0%            6%               6%  Somnolence 3%  0%            9%             9%  Nausea 8%  0%             5%            5%  Dry Mouth 8% 0%             7%             7%  Fatigue 2% 0%             4%            6%  Palpitations 1%  0%             3%             1%      Rare but important: Convulsions    Seen in (3) subjects taking 60mg /day- dose has been eliminated     Clinical Research officer, political party / Research RN note : This visit for Subject Heidi Franklin with DOB: [birth year] on 06/04/2017 for the above protocol is Visit/Encounter # Consenting and is for purpose of research . The consent for this encounter is under Protocol Version 3.1 and is currently IRB approved. Subject expressed continued interest and consent in continuing as a study subject. Subject confirmed that there was no change in contact information (e.g. address, telephone, email). Subject thanked for participation in research and contribution to science.   Subject presented to research clinic today for purpose of consenting ot participate in the above mentioned study. Refer to the informed consent documentation checklist filed in the subjects paper source binder.   After consent was obtained this coordinator reviewed the subjects medication list with the subject. Refer to the PI's note regarding the following medications and his recommendations for each as it pertains to the study:  1. Chlortrimeton tablets-last dose 07/Aug/18 in the PM 2. Flonase Nasal Spray:  takes this for Chronic Rhinitis,   3. Ventolin Inhaler: uses PRN, last dose >1 week ago 4. Singulair: Subject states this medication was stopped x 1 month ago and no plan to re-start.  5. Trilegy Inhaler: This was discontinued 03/2017  Thedore Mins, RN was also present for the consenting process.   Signed by Platter Bing, [place] Coordinator [facility]  [place], [place] 10:21 AM 06/04/2017

## 2017-06-18 ENCOUNTER — Ambulatory Visit: Payer: Medicare Other | Admitting: Internal Medicine

## 2017-06-20 ENCOUNTER — Other Ambulatory Visit: Payer: Self-pay | Admitting: Emergency Medicine

## 2017-06-20 ENCOUNTER — Ambulatory Visit: Payer: Medicare Other | Admitting: Internal Medicine

## 2017-06-20 MED ORDER — ALPRAZOLAM 0.5 MG PO TABS: ORAL_TABLET | ORAL | 0 refills | Status: DC

## 2017-06-20 MED ORDER — LOSARTAN POTASSIUM-HCTZ 100-12.5 MG PO TABS: 1.0000 | ORAL_TABLET | Freq: Every day | ORAL | 0 refills | Status: DC

## 2017-06-20 NOTE — Telephone Encounter (Signed)
Mail order requesting refill for Xanax. Please advise

## 2017-06-20 NOTE — Telephone Encounter (Signed)
[place] controlled substance database checked.  Ok to fill medication. Printed `

## 2017-06-23 ENCOUNTER — Ambulatory Visit (INDEPENDENT_AMBULATORY_CARE_PROVIDER_SITE_OTHER): Payer: Medicare Other | Admitting: Internal Medicine

## 2017-06-23 ENCOUNTER — Encounter: Payer: Self-pay | Admitting: Internal Medicine

## 2017-06-23 VITALS — BP 128/74 | HR 87 | Ht 60.0 in | Wt 164.0 lb

## 2017-06-23 DIAGNOSIS — R05 Cough: Secondary | ICD-10-CM | POA: Diagnosis not present

## 2017-06-23 DIAGNOSIS — R058 Other specified cough: Secondary | ICD-10-CM

## 2017-06-23 DIAGNOSIS — Z23 Encounter for immunization: Secondary | ICD-10-CM | POA: Diagnosis not present

## 2017-06-23 DIAGNOSIS — J479 Bronchiectasis, uncomplicated: Secondary | ICD-10-CM | POA: Diagnosis not present

## 2017-06-23 MED ORDER — PREDNISONE 10 MG PO TABS: ORAL_TABLET | ORAL | 0 refills | Status: DC

## 2017-06-23 NOTE — Patient Instructions (Addendum)
Prednisone 10 mg take  4 each am x 2 days,   2 each am x 2 days,  1 each am x 2 days and stop    GERD (REFLUX)  is an extremely common cause of respiratory symptoms just like yours , many times with no obvious heartburn at all.    It can be treated with medication, but also with lifestyle changes including elevation of the head of your bed (ideally with 6 inch  bed blocks),  Smoking cessation, avoidance of late meals, excessive alcohol, and avoid fatty foods, chocolate, peppermint, colas, red wine, and acidic juices such as orange juice.  NO MINT OR MENTHOL PRODUCTS SO NO COUGH DROPS  USE SUGARLESS CANDY INSTEAD (Jolley ranchers or Stover's or Life Savers) or even ice chips will also do - the key is to swallow to prevent all throat clearing. NO OIL BASED VITAMINS - use powdered substitutes.    I will contact the research nurse to help get yo through the period before the study starts

## 2017-06-23 NOTE — Telephone Encounter (Signed)
RX faxed to Mail order 

## 2017-06-23 NOTE — Progress Notes (Signed)
Subjective:    Patient ID: Heidi Franklin, female   DOB: [birth year]    MRN: [id number]  Brief patient profile:  71   yobf never smoker with chronic cough  since age 71's only resolves for a week or two  at a time then recurs daily,  dx as  post-nasal drip with nasal septal deviation, and GERD with neg MCT 11/2013   Workup for cough : CT sinus 11/08/11>>Negative paranasal sinuses.  Rightward nasal septal deviation and possible rhinitis. 01/13/12>>  RAST negative, IgE 22.7 PFT 11/07/11>>FEV1 1.79 (102%),  Ratio 82, TLC 4.18 (104%), DLCO 69%, positive BD only in terms of fef 25/75 -med calendar 11/23/2012 , 03/18/2013 , 08/26/2013 , 12/06/2013, 06/21/2014 > not keeping up with it  -methacholine challenge test  12/24/13 > neg  - try off singulair 12/31/2013 > no worse  - added neurontin 100 mg qid  05/20/14 but did not take consistently as of 06/30/2014 > increased to 100 qid 08/23/2014 improved then stopped it and flared spirng of 2016  - 07/05/2015 increase neurontin 300 tid and added elavil 10 mg at hs > not clear she did this at f/u ov 08/03/2015  - added flutter valve 08/03/2015  -08/2015 neurontin /tramadol stopped due to falls.      History of Present Illness  08/03/2012 f/u ov/Wert cc cough for decades, better on present rx than for many years prior to pulmonary clinic changes, not using calendar, poor hfa  Technique but no limiting sob.  rec Change zyrtec to where you take it only if needed for itching/ sneezing  Work on inhaler technique:      Ok to use symbicort with spacer if not improving     12/06/2013 NP Follow up and Med review  Patient returns for a followup visit and medication review. We reviewed all her medications organized them into a medication calendar with patient education. Appears the patient is taking her medications correctly. Since last visit. Patient reports that she is doing better. Cough is not as bad. Last ov Symbicort was changed to As needed  Use only.  She is using on  average every other day. Not sure if she needs or not.  No fever, chest pain , edema or wheezing. No xopenex use since last ov.  rec MCT > neg > d/c symbicort     11/04/2016  f/u ov/Wert re: cough x 50 y better on no maint rx  Chief Complaint  Patient presents with  . Follow-up    Breathing is overall doing well. She has occ cough, non prod. She had renal CT done 10/14/17 that showed MPN. She is using ventolin HFA 1 x per wk on average.   Doe = MMRC1 = can walk nl pace, flat grade, can't hurry or go uphills or steps s sob   rec See calendar for specific medication instructions and bring it back for each and every office visit for every healthcare provider you see..   Let me know if you would like to see the throat specialists at [facility]     06/23/2017  f/u ov/Wert re: recurrent cough x> 51 y / no med calendar/ no candy handy  Chief Complaint  Patient presents with  . Follow-up    Cough has been worse for the past wk- relates to stopping all of her cough meds in order to start research study.  Her cough has been prod with minimal clear sputum.    Cough did seem some better p prednisone last ov  Worse cough around 2-3 each am  No assoc sob  Being considered for cough trial here and confused with what she can or cannot take during run in and not really clear what she was taking previously as fails to do med reconciliation/ follow maint vs prns at baseline    No obvious day to day or daytime variability or assoc excess/ purulent sputum or mucus plugs or hemoptysis or cp or chest tightness, subjective wheeze or overt sinus or hb symptoms. No unusual exp hx or h/o childhood pna/ asthma or knowledge of premature birth.  Also denies any obvious fluctuation of symptoms with weather or environmental changes or other aggravating or alleviating factors except as outlined above   Current Medications, Allergies, Complete Past Medical History, Past Surgical History, Family History, and Social History  were reviewed in [employer] record.  ROS  The following are not active complaints unless bolded sore throat, dysphagia, dental problems, itching, sneezing,  nasal congestion or excess/ purulent secretions, ear ache,   fever, chills, sweats, unintended wt loss, classically pleuritic or exertional cp,  orthopnea pnd or leg swelling, presyncope, palpitations, abdominal pain, anorexia, nausea, vomiting, diarrhea  or change in bowel or bladder habits, change in stools or urine, dysuria,hematuria,  rash, arthralgias, visual complaints, headache, numbness, weakness or ataxia or problems with walking or coordination,  change in mood/affect or memory.                     Objective:   Physical Exam   Exam:  GEN: A/Ox3; pleasant , NAD,  amb obese hoarse  bf nad     . 05/20/2014       168   >168 06/21/2014 >  06/30/2014  168 > 08/23/2014  170 >183 03/20/2015 > 04/10/2015 180 >181 05/02/2015 >  07/05/2015 177  > 08/03/2015 171 > 12/04/2015  166 >165 12/15/2015 > 02/23/2016  163>  11/04/2016  166   Vital signs reviewed  - Note on arrival 02 sats  97% on  RA      HEENT: nl dentition, turbinates bilaterally, and oropharynx. Nl external ear canals without cough reflex   NECK :  without JVD/Nodes/TM/ nl carotid upstrokes bilaterally   LUNGS: no acc muscle use,  Nl contour chest which is clear to A and P bilaterally without cough on insp or exp maneuvers   CV:  RRR  no s3 or murmur or increase in P2, and no edema   ABD:  Obese soft and nontender with nl inspiratory excursion in the supine position. No bruits or organomegaly appreciated, bowel sounds nl  MS:  Nl gait/ ext warm without deformities, calf tenderness, cyanosis or clubbing No obvious joint restrictions   SKIN: warm and dry without lesions    NEURO:  alert, approp, nl sensorium with  no motor or cerebellar deficits apparent.          I personally reviewed images and agree with radiology impression as follows:    Chest CT = HRCT  7//5/18 1. No evidence of interstitial lung disease. 2. Very mild cylindrical bronchiectasis in the the lower lobes of the lungs bilaterally. 3. Scattered tiny pulmonary nodules measuring 4 mm or less in size in the lungs bilaterally. These are nonspecific, but statistically likely benign. No follow-up needed if patient is low-risk (and has no known or suspected primary neoplasm).

## 2017-06-24 ENCOUNTER — Telehealth: Payer: Self-pay | Admitting: Internal Medicine

## 2017-06-24 MED ORDER — PREDNISONE 10 MG PO TABS: ORAL_TABLET | ORAL | 0 refills | Status: DC

## 2017-06-24 NOTE — Telephone Encounter (Signed)
pred taper was sent to mail order pharmacy at yesterday's [facility].  Pt requesting this to be sent to local pharmacy.  rx has been resent.  Nothing further needed.

## 2017-06-25 ENCOUNTER — Encounter: Payer: Self-pay | Admitting: Internal Medicine

## 2017-06-25 DIAGNOSIS — J479 Bronchiectasis, uncomplicated: Secondary | ICD-10-CM | POA: Insufficient documentation

## 2017-06-25 NOTE — Assessment & Plan Note (Signed)
CT sinus 11/08/11>>Negative paranasal sinuses.  Rightward nasal septal deviation and possible rhinitis. 01/13/12>>  RAST negative, IgE 22.7 PFT 11/07/11>>FEV1 1.79 (102%),  Ratio 82, TLC 4.18 (104%), DLCO 69%, positive BD only in terms of fef 25/75 -med calendar 11/23/2012 , 03/18/2013 , 08/26/2013 , 12/06/2013, 06/21/2014 > not keeping up with it ,redone  06/11/2016  -methacholine challenge test  12/24/13 > neg  - try off singulair 12/31/2013 > no worse  - added neurontin 100 mg qid  05/20/14 but did not take consistently as of 06/30/2014 > increased to 100 qid 08/23/2014 improved then stopped it and flared spirng of 2016  - 07/05/2015 increase neurontin 300 tid and added elavil 10 mg at hs > not clear she did this at f/u ov 08/03/2015  - added flutter valve 08/03/2015  - neurontin/ultram /elavil stopped 07/2015 due to falls > no worse 12/04/2015   Reports better with prednisone but she is not supposed to use any other medications leading up to initiation of the cough trial. I will verify that this is correct and have the cough trial nurse contact her regarding additional recommendations to get her through this run in period  I had an extended discussion with the patient reviewing all relevant studies completed to date and  lasting 15 to 20 minutes of a 25 minute visit    Each maintenance medication was reviewed in detail including most importantly the difference between maintenance and prns and under what circumstances the prns are to be triggered using an action plan format that is not reflected in the computer generated alphabetically organized AVS.    Please see AVS for specific instructions unique to this visit that I personally wrote and verbalized to the the pt in detail and then reviewed with pt  by my nurse highlighting any  changes in therapy recommended at today's visit to their plan of care.

## 2017-06-25 NOTE — Assessment & Plan Note (Signed)
See HRCT  05/01/17 with very mild bilateral cylindrical bronchiectasis in the lower lobes bilaterally.  Her cough does not fit the pattern of bronchiectasis and I believe this is an incidental finding but may preclude her from entry into the trial. Will check on this with the research nurse as well.

## 2017-07-01 ENCOUNTER — Ambulatory Visit (INDEPENDENT_AMBULATORY_CARE_PROVIDER_SITE_OTHER): Payer: Medicare Other | Admitting: Podiatry

## 2017-07-01 ENCOUNTER — Encounter: Payer: Self-pay | Admitting: Podiatry

## 2017-07-01 VITALS — BP 173/91 | HR 83

## 2017-07-01 DIAGNOSIS — E119 Type 2 diabetes mellitus without complications: Secondary | ICD-10-CM

## 2017-07-01 NOTE — Patient Instructions (Signed)
The yearly diabetic foot screen today demonstrated adequate circulation feeling in your feet. Overall diabetic foot standpoint you're doing well Please review the general diabetic foot care suggestions Reappoint yearly or if you have a specific problem  Diabetes and Foot Care Diabetes may cause you to have problems because of poor blood supply (circulation) to your feet and legs. This may cause the skin on your feet to become thinner, break easier, and heal more slowly. Your skin may become dry, and the skin may peel and crack. You may also have nerve damage in your legs and feet causing decreased feeling in them. You may not notice minor injuries to your feet that could lead to infections or more serious problems. Taking care of your feet is one of the most important things you can do for yourself. Follow these instructions at home:  Wear shoes at all times, even in the house. Do not go barefoot. Bare feet are easily injured.  Check your feet daily for blisters, cuts, and redness. If you cannot see the bottom of your feet, use a mirror or ask someone for help.  Wash your feet with warm water (do not use hot water) and mild soap. Then pat your feet and the areas between your toes until they are completely dry. Do not soak your feet as this can dry your skin.  Apply a moisturizing lotion or petroleum jelly (that does not contain alcohol and is unscented) to the skin on your feet and to dry, brittle toenails. Do not apply lotion between your toes.  Trim your toenails straight across. Do not dig under them or around the cuticle. File the edges of your nails with an emery board or nail file.  Do not cut corns or calluses or try to remove them with medicine.  Wear clean socks or stockings every day. Make sure they are not too tight. Do not wear knee-high stockings since they may decrease blood flow to your legs.  Wear shoes that fit properly and have enough cushioning. To break in new shoes, wear  them for just a few hours a day. This prevents you from injuring your feet. Always look in your shoes before you put them on to be sure there are no objects inside.  Do not cross your legs. This may decrease the blood flow to your feet.  If you find a minor scrape, cut, or break in the skin on your feet, keep it and the skin around it clean and dry. These areas may be cleansed with mild soap and water. Do not cleanse the area with peroxide, alcohol, or iodine.  When you remove an adhesive bandage, be sure not to damage the skin around it.  If you have a wound, look at it several times a day to make sure it is healing.  Do not use heating pads or hot water bottles. They may burn your skin. If you have lost feeling in your feet or legs, you may not know it is happening until it is too late.  Make sure your health care provider performs a complete foot exam at least annually or more often if you have foot problems. Report any cuts, sores, or bruises to your health care provider immediately. Contact a health care provider if:  You have an injury that is not healing.  You have cuts or breaks in the skin.  You have an ingrown nail.  You notice redness on your legs or feet.  You feel burning or tingling  in your legs or feet.  You have pain or cramps in your legs and feet.  Your legs or feet are numb.  Your feet always feel cold. Get help right away if:  There is increasing redness, swelling, or pain in or around a wound.  There is a red line that goes up your leg.  Pus is coming from a wound.  You develop a fever or as directed by your health care provider.  You notice a bad smell coming from an ulcer or wound. This information is not intended to replace advice given to you by your health care provider. Make sure you discuss any questions you have with your health care provider. Document Released: 10/11/2000 Document Revised: 03/21/2016 Document Reviewed: 03/23/2013 Elsevier  Interactive Patient Education  2017 [employer].

## 2017-07-01 NOTE — Progress Notes (Signed)
   Subjective:    Patient ID: Heidi Franklin, female    DOB: 12-24-1945, 71 y.o.   MRN: [id number]  HPI This patient presents today requesting a yearly diabetic foot examination. Patient denies any history of skin ulceration, amputations or claudication. She denies having any specific foot concerns. Patient denies tingling, burning, numbness Patient denies smoking history   Review of Systems  All other systems reviewed and are negative.      Objective:   Physical Exam Patient estimates she is approximately 5 feet and weighs approximately 165 pounds  Vascular: DP and PT pulses 2/4 bilaterally Capillary reflex within normal limits bilaterally  Neurological: Sensation to 10 g monofilament wire intact 2/8 bilaterally Vibratory sensation reactive bilaterally Ankle reflex reactive bilaterally  Dermatological Texture and turgor within normal limits bilaterally No open skin lesions bilaterally  Musculoskeletal: There is no restriction ankle, subtalar, midtarsal joints, metatarsophalangeal joints bilaterally Manual motor testing dorsi flexion, plantar flexion, inversion, eversion 5/5 bilaterally There is sure to the fifth toes bilaterally Mild bunions bilaterally Pes planus bilaterally       Assessment & Plan:   Assessment: Diabetic without foot complications  Plan: Today I reviewed the results of exam with patient. I informed her that the diabetic foot screen was within normal limits. At this time I recommended yearly foot screens or to return if she has specific problem

## 2017-07-02 ENCOUNTER — Ambulatory Visit: Payer: Medicare Other | Admitting: Podiatry

## 2017-07-10 ENCOUNTER — Encounter: Payer: Self-pay | Admitting: Family Medicine

## 2017-07-10 ENCOUNTER — Ambulatory Visit (INDEPENDENT_AMBULATORY_CARE_PROVIDER_SITE_OTHER): Payer: Medicare Other | Admitting: Family Medicine

## 2017-07-10 ENCOUNTER — Ambulatory Visit (INDEPENDENT_AMBULATORY_CARE_PROVIDER_SITE_OTHER)
Admission: RE | Admit: 2017-07-10 | Discharge: 2017-07-10 | Disposition: A | Discharge status: Home / Self Care | Payer: Medicare Other | Source: Ambulatory Visit | Attending: Family Medicine | Admitting: Family Medicine

## 2017-07-10 VITALS — BP 140/70 | HR 82 | Ht 60.0 in | Wt 166.0 lb

## 2017-07-10 DIAGNOSIS — M25551 Pain in right hip: Secondary | ICD-10-CM | POA: Insufficient documentation

## 2017-07-10 DIAGNOSIS — M5136 Other intervertebral disc degeneration, lumbar region: Secondary | ICD-10-CM | POA: Diagnosis not present

## 2017-07-10 DIAGNOSIS — M25552 Pain in left hip: Secondary | ICD-10-CM

## 2017-07-10 DIAGNOSIS — R102 Pelvic and perineal pain: Secondary | ICD-10-CM | POA: Diagnosis not present

## 2017-07-10 MED ORDER — NORTRIPTYLINE HCL 10 MG PO CAPS: 10.0000 mg | ORAL_CAPSULE | Freq: Every day | ORAL | 1 refills | Status: DC

## 2017-07-10 MED ORDER — VITAMIN D (ERGOCALCIFEROL) 1.25 MG (50000 UNIT) PO CAPS: 50000.0000 [IU] | ORAL_CAPSULE | ORAL | 0 refills | Status: DC

## 2017-07-10 NOTE — Assessment & Plan Note (Signed)
Like more secondary to patient's back. Possible radicular symptoms. Nortriptyline started. Discussed icing regimen. Home exercise given and work with Product/process development scientist. Vitamin D for muscle strengthening conditioning. X-rays pending. Follow-up in 4 weeks

## 2017-07-10 NOTE — Patient Instructions (Signed)
Good to see you  Xrays downstairs today  Exercises 3 times a week.  pennsaid pinkie amount topically 2 times daily as needed.   Once weekly vitamin D for 12 weeks.  Nortriptyline 1-2 pills at night duexis 3 times a day for 3 days Stay active See me again in 3 weeks.

## 2017-07-10 NOTE — Progress Notes (Signed)
[facility] [address], [place] [address] Phone: [phone] Subjective:     CC: right hip pain   [id number]  Heidi Franklin is a 71 y.o. female coming in with complaint of groin pain. Has been taking ib profen and tylenol for pain that hasn't been working. Has been using back brace that helps.   Onset-  Off and on. Has lasted the longest now (For about a week) Location- Hip right greater then left  Duration- intermittent.  Character-dull throbbing Aggravating factors- Getting up out of chair, stairs Reliving factors- resting Therapies tried-  Severity- last week 8     Past Medical History:  Diagnosis Date  . ALLERGIC RHINITIS   . Allergy   . Anxiety state, unspecified   . Arthritis   . BRONCHITIS, CHRONIC   . DEPRESSION   . DIABETES MELLITUS, TYPE II   . GERD   . HYPERLIPIDEMIA   . HYPERTENSION   . Psoriasis    Past Surgical History:  Procedure Laterality Date  . ABDOMINAL HYSTERECTOMY  1980  . TRIGGER FINGER RELEASE     Index finger   Social History   Social History  . Marital status: Married    Spouse name: N/A  . Number of children: 0  . Years of education: 73yr colge   Occupational History  . Retired     Social History Main Topics  . Smoking status: Never Smoker  . Smokeless tobacco: Never Used  . Alcohol use No  . Drug use: No  . Sexual activity: No   Other Topics Concern  . None   Social History Narrative   Married, lives with spouse-retired from [employer] insurance   Not employed    Drinks coffee occasional, Consumes 1 soda a day    Allergies  Allergen Reactions  . Metformin And Related Diarrhea  . Penicillins Rash   Family History  Problem Relation Age of Onset  . Stroke Mother   . Angina Mother   . Diabetes Father   . Hyperlipidemia Other        Parent  . Hypertension Other        Parent  . Diabetes Sister        x1  . CAD Sister   . Diabetes Brother        x 2  . Colon cancer Neg Hx     . Esophageal cancer Neg Hx   . Rectal cancer Neg Hx   . Stomach cancer Neg Hx   . Pancreatic cancer Neg Hx      Past medical history, social, surgical and family history all reviewed in electronic medical record.  No pertanent information unless stated regarding to the chief complaint.   Review of Systems:Review of systems updated and as accurate as of 07/10/17  No headache, visual changes, nausea, vomiting, diarrhea, constipation, dizziness, abdominal pain, skin rash, fevers, chills, night sweats, weight loss, swollen lymph nodes, body aches, joint swelling, chest pain, shortness of breath, mood changes. + muscle aches.   Objective  Blood pressure 140/70, pulse 82, height 5' (1.524 m), weight 166 lb (75.3 kg), SpO2 94 %. Systems examined below as of 07/10/17   General: No apparent distress alert and oriented x3 mood and affect normal, dressed appropriately.  HEENT: Pupils equal, extraocular movements intact  Respiratory: Patient's speak in full sentences and does not appear short of breath  Cardiovascular: No lower extremity edema, non tender, no erythema  Skin: Warm dry intact with  no signs of infection or rash on extremities or on axial skeleton.  Abdomen: Soft nontender  Neuro: Cranial nerves II through XII are intact, neurovascularly intact in all extremities with 2+ DTRs and 2+ pulses.  Lymph: No lymphadenopathy of posterior or anterior cervical chain or axillae bilaterally.  Gait normal with good balance and coordination.  MSK:  Non tender with full range of motion and good stability and symmetric strength and tone of shoulders, elbows, wrist, , knee and ankles bilaterally. Arthritic changes noted.  Back Exam:  Inspection: Degenerative scoliosis Motion: Flexion 35 deg, Extension 15 deg, Side Bending to 35 deg bilaterally,  Rotation to 35 deg bilaterally  SLR laying: Negative  XSLR laying: Negative  Palpable tenderness: Tender to palpation in right paraspinal  musculature. FABER: tight bilaterally. . Sensory change: Gross sensation intact to all lumbar and sacral dermatomes.  Reflexes: 2+ at both patellar tendons, 2+ at achilles tendons, Babinski's downgoing.  Strength at foot  Plantar-flexion: 5/5 Dorsi-flexion: 5/5 Eversion: 5/5 Inversion: 5/5  Leg strength  Quad: 5/5 Hamstring: 5/5 Hip flexor: 5/5 Hip abductors: 4/5 but symmetric Gait unremarkable.  Procedure 92119; 15 additional minutes spent for Therapeutic exercises as stated in above notes.  This included exercises focusing on stretching, strengthening, with significant focus on eccentric aspects.   Long term goals include an improvement in range of motion, strength, endurance as well as avoiding reinjury. Patient's frequency would include in 1-2 times a day, 3-5 times a week for a duration of 6-12 weeks. Low back exercises that included:  Pelvic tilt/bracing instruction to focus on control of the pelvic girdle and lower abdominal muscles  Glute strengthening exercises, focusing on proper firing of the glutes without engaging the low back muscles Proper stretching techniques for maximum relief for the hamstrings, hip flexors, low back and some rotation where tolerated   Proper technique shown and discussed handout in great detail with ATC.  All questions were discussed and answered.     Impression and Recommendations:     This case required medical decision making of moderate complexity.      Note: This dictation was prepared with Dragon dictation along with smaller phrase technology. Any transcriptional errors that result from this process are unintentional.

## 2017-07-22 ENCOUNTER — Other Ambulatory Visit: Payer: Self-pay | Admitting: Internal Medicine

## 2017-07-23 ENCOUNTER — Encounter: Payer: Self-pay | Admitting: Family Medicine

## 2017-07-31 ENCOUNTER — Ambulatory Visit (INDEPENDENT_AMBULATORY_CARE_PROVIDER_SITE_OTHER): Payer: Medicare Other | Admitting: Family Medicine

## 2017-07-31 ENCOUNTER — Encounter: Payer: Self-pay | Admitting: Family Medicine

## 2017-07-31 VITALS — BP 118/70 | HR 97 | Ht 60.0 in | Wt 167.0 lb

## 2017-07-31 DIAGNOSIS — M25551 Pain in right hip: Secondary | ICD-10-CM | POA: Diagnosis not present

## 2017-07-31 DIAGNOSIS — M5441 Lumbago with sciatica, right side: Secondary | ICD-10-CM

## 2017-07-31 DIAGNOSIS — M25552 Pain in left hip: Secondary | ICD-10-CM

## 2017-07-31 NOTE — Assessment & Plan Note (Signed)
Spent  25 minutes with patient face-to-face and had greater than 50% of counseling including as described in assessment and plan.No radicular symptoms. Patient is having worsening discomfort. Patient has been noncompliant with medications. We did discuss the possibility of MRI which she declined, we discussed laboratory workup which she declined, we also discussed the possibility of other medications which she declined. Patient will start nortriptyline and see if this makes improvement and continue the conservative therapy. Discussed with patient at great length.

## 2017-07-31 NOTE — Patient Instructions (Signed)
Good to see you  Ice is your friend Stay active  Try the medicine See me again in 4-6 weeks

## 2017-07-31 NOTE — Progress Notes (Signed)
[name] [address], [place] [address] Phone: [phone] Subjective:      CC: Back pain, hip pain follow-up  [id number]  Heidi Franklin is a 71 y.o. female coming in for follow up for hip pain on the right side. She has pain into her groin. She says some days doesn't hurt as much and then other days it bothers her. Patient was sent for x-rays of the back as well as the hips at last follow-up. These were independently visualized by me. Showed very minimal degenerative disc disease as well as very minimal narrowing of the hips bilaterally. Patient was to start nortriptyline which she did not do. Continues to have migratory pains. Seems to be all over. Comes and goes. Denies any fevers chills or any abnormal weight loss.     Past Medical History:  Diagnosis Date  . ALLERGIC RHINITIS   . Allergy   . Anxiety state, unspecified   . Arthritis   . BRONCHITIS, CHRONIC   . DEPRESSION   . DIABETES MELLITUS, TYPE II   . GERD   . HYPERLIPIDEMIA   . HYPERTENSION   . Psoriasis    Past Surgical History:  Procedure Laterality Date  . ABDOMINAL HYSTERECTOMY  1980  . TRIGGER FINGER RELEASE     Index finger   Social History   Social History  . Marital status: Married    Spouse name: N/A  . Number of children: 0  . Years of education: 38yr colge   Occupational History  . Retired     Social History Main Topics  . Smoking status: Never Smoker  . Smokeless tobacco: Never Used  . Alcohol use No  . Drug use: No  . Sexual activity: No   Other Topics Concern  . None   Social History Narrative   Married, lives with spouse-retired from [employer] insurance   Not employed    Drinks coffee occasional, Consumes 1 soda a day    Allergies  Allergen Reactions  . Metformin And Related Diarrhea  . Penicillins Rash   Family History  Problem Relation Age of Onset  . Stroke Mother   . Angina Mother   . Diabetes Father   . Hyperlipidemia Other     Parent  . Hypertension Other        Parent  . Diabetes Sister        x1  . CAD Sister   . Diabetes Brother        x 2  . Colon cancer Neg Hx   . Esophageal cancer Neg Hx   . Rectal cancer Neg Hx   . Stomach cancer Neg Hx   . Pancreatic cancer Neg Hx      Past medical history, social, surgical and family history all reviewed in electronic medical record.  No pertanent information unless stated regarding to the chief complaint.   Review of Systems:Review of systems updated and as accurate as of 07/31/17  No headache, visual changes, nausea, vomiting, diarrhea, constipation, dizziness, abdominal pain, skin rash, fevers, chills, night sweats, weight loss, swollen lymph nodes, chest pain, shortness of breath, mood changes. Positive body aches, muscle aches  Objective  Blood pressure 118/70, pulse 97, height 5' (1.524 m), weight 167 lb (75.8 kg), SpO2 95 %. Systems examined below as of 07/31/17   General: No apparent distress alert and oriented x3 mood and affect normal, dressed appropriately.  HEENT: Pupils equal, extraocular movements intact  Respiratory: Patient's speak in full  sentences and does not appear short of breath  Cardiovascular: No lower extremity edema, non tender, no erythema  Skin: Warm dry intact with no signs of infection or rash on extremities or on axial skeleton.  Abdomen: Soft nontender  Neuro: Cranial nerves II through XII are intact, neurovascularly intact in all extremities with 2+ DTRs and 2+ pulses.  Lymph: No lymphadenopathy of posterior or anterior cervical chain or axillae bilaterally.  Gait normal with good balance and coordination.  MSK:  Mild to moderate tender with full range of motion and good stability and symmetric strength and tone of shoulders, elbows, wrist, knee and ankles bilaterally. Arthritic changes of multiple joints Hip exam shows mild decrease in internal range of motion bilaterally Back reveals diffuse tenderness in the paraspinal  musculature lumbar spine is limited range of motion in all planes. Good strength in lower extremity.     Impression and Recommendations:     This case required medical decision making of moderate complexity.      Note: This dictation was prepared with Dragon dictation along with smaller phrase technology. Any transcriptional errors that result from this process are unintentional.       ;lt

## 2017-08-07 NOTE — Patient Instructions (Addendum)
  Test(s) ordered today. Your results will be released to MyChart (or called to you) after review, usually within 72hours after test completion. If any changes need to be made, you will be notified at that same time.  No immunizations administered today.   Medications reviewed and updated.  No changes recommended at this time.  Please followup in 6 months   

## 2017-08-07 NOTE — Progress Notes (Signed)
Subjective:    Patient ID: Heidi Franklin, female    DOB: [birth year], 71 y.o.   MRN: [id number]  HPI The patient is here for follow up.  Concerns about her memory:  She feels like she can not remember things as well.  If someone tells her something she often can not remember it.  She has difficulty with name recall.  She denies getting lost while driving.  She can do calculations/banking.    Diabetes: She is taking her medication daily as prescribed. She is compliant with a diabetic diet. She is not exercising regularly. She monitors her sugars and they have been running <150. She checks her feet daily and denies foot lesions. She sees podiatry. She is up-to-date with an ophthalmology examination.   Hypertension: She is taking her medication daily. She is compliant with a low sodium diet.  She denies chest pain, palpitations, edema, shortness of breath and regular headaches. She is not exercising regularly.  She does not monitor her blood pressure at home.    GERD:  She is taking her medication daily as prescribed.  She denies any GERD symptoms and feels her GERD is well controlled.   Hyperlipidemia: She is taking her medication daily. She is compliant with a low fat/cholesterol diet. She is not exercising regularly. She denies myalgias.   Depression, Anxiety: She is taking her sertraline daily as prescribed. She takes the xanax only as needed.  She denies any side effects from the medication. She feels her anxiety and depression are well controlled and she is happy with her current dose of medication.   Medications and allergies reviewed with patient and updated if appropriate.  Patient Active Problem List   Diagnosis Date Noted  . Bilateral hip pain 07/10/2017  . Bronchiectasis without complication ([place]) 99/83/3825  . Irritable larynx syndrome 04/25/2017  . Benign lipomatous neoplasm of skin and subcutaneous tissue of right leg 02/25/2017  . Anemia 02/05/2017  . Fatigue 01/23/2017  .  Chronic cough 01/02/2017  . Multiple pulmonary nodules 11/10/2016  . RLQ abdominal pain 10/09/2016  . Acute midline low back pain with right-sided sciatica 10/09/2016  . Moderate right ankle sprain 09/17/2016  . Fall 08/08/2016  . Neck pain 07/29/2016  . Degenerative arthritis of right knee 06/20/2016  . Foot pain, left 05/28/2016  . Degenerative arthritis of left knee 04/10/2016  . Peroneal tendinitis of left lower extremity 09/05/2015  . Recurrent falls 08/08/2015  . Bursitis of left shoulder 07/13/2015  . Morbid obesity due to excess calories ([place]) 04/16/2015  . Nonspecific abnormal electrocardiogram (ECG) (EKG) 05/11/2011  . Tenosynovitis of finger 04/22/2011  . Type 2 diabetes, uncontrolled, with neuropathy ([place]) 03/02/2010  . Hyperlipidemia associated with type 2 diabetes mellitus ([place]) 03/02/2010  . Depression with anxiety 03/02/2010  . Essential hypertension 03/02/2010  . Chronic rhinitis 03/02/2010  . Upper airway cough syndrome 03/02/2010  . GERD 03/02/2010    Current Outpatient Prescriptions on File Prior to Visit  Medication Sig Dispense Refill  . acetaminophen (TYLENOL) 325 MG tablet Take 650 mg by mouth every 6 (six) hours as needed.    Marland Kitchen albuterol (VENTOLIN HFA) 108 (90 Base) MCG/ACT inhaler Inhale 1-2 puffs into the lungs every 6 (six) hours as needed for wheezing or shortness of breath. 1 Inhaler 3  . ALPRAZolam (XANAX) 0.5 MG tablet take 1 tablet by mouth every 8 hours if needed for anxiety 30 tablet 0  . amLODipine (NORVASC) 10 MG tablet TAKE 1 TABLET EVERY DAY 90 tablet  0  . aspirin 81 MG tablet Take 81 mg by mouth every morning.     . Calcium Citrate-Vitamin D (CALCIUM + D PO) Take 1 tablet by mouth every morning.    . chlorpheniramine (CHLOR-TRIMETON) 4 MG tablet Take 2 tabs by mouth at bedtime and every 4 hours as needed for drainage/drippy nose/sneezing    . famotidine (PEPCID) 20 MG tablet Take 1 tablet (20 mg total) by mouth at bedtime. 30 tablet 11  .  fluocinonide ointment (LIDEX) 8.85 % Apply 1 application topically as needed.     . fluticasone (FLONASE) 50 MCG/ACT nasal spray USE 1 SPRAY IN EACH NOSTRIL TWICE DAILY 48 g 0  . Fluticasone-Umeclidin-Vilant (TRELEGY ELLIPTA) 100-62.5-25 MCG/INH AEPB Inhale 1 puff into the lungs daily. 1 each 0  . glucose blood (ONE TOUCH ULTRA TEST) test strip 1 each by Other route daily. Use 1 strips to check blood sugar twice a day Ex E11.9 100 each 3  . glyBURIDE (DIABETA) 2.5 MG tablet TAKE 1 TABLET EVERY DAY WITH BREAKFAST 90 tablet 0  . ibuprofen (ADVIL,MOTRIN) 600 MG tablet Take 1 tablet (600 mg total) by mouth every 6 (six) hours as needed. 30 tablet 0  . Lancets (ONETOUCH ULTRASOFT) lancets PRN 100 each 3  . lansoprazole (PREVACID) 30 MG capsule Take 1 capsule (30 mg total) by mouth daily before breakfast. 30 capsule 5  . losartan-hydrochlorothiazide (HYZAAR) 100-12.5 MG tablet Take 1 tablet by mouth daily. 90 tablet 0  . lovastatin (MEVACOR) 20 MG tablet TAKE 1 TABLET AT BEDTIME 90 tablet 0  . mometasone (ELOCON) 0.1 % ointment     . montelukast (SINGULAIR) 10 MG tablet Take 1 tablet (10 mg total) by mouth at bedtime. 30 tablet 3  . Multiple Vitamins-Minerals (CENTRUM SILVER PO) Take 1 tablet by mouth every morning.    . nortriptyline (PAMELOR) 10 MG capsule Take 1-2 capsules (10-20 mg total) by mouth at bedtime. 60 capsule 1  . omeprazole (PRILOSEC) 40 MG capsule TAKE 1 CAPSULE EVERY DAY 90 capsule 0  . Potassium Chloride ER 20 MEQ TBCR TAKE 1 TABLET EVERY DAY 90 tablet 3  . Respiratory Therapy Supplies (FLUTTER) DEVI Use as directed 1 each 0  . sertraline (ZOLOFT) 100 MG tablet TAKE 1 TABLET EVERY MORNING 90 tablet 0  . Vitamin D, Ergocalciferol, (DRISDOL) 50000 units CAPS capsule Take 1 capsule (50,000 Units total) by mouth every 7 (seven) days. 12 capsule 0   No current facility-administered medications on file prior to visit.     Past Medical History:  Diagnosis Date  . ALLERGIC RHINITIS     . Allergy   . Anxiety state, unspecified   . Arthritis   . BRONCHITIS, CHRONIC   . DEPRESSION   . DIABETES MELLITUS, TYPE II   . GERD   . HYPERLIPIDEMIA   . HYPERTENSION   . Psoriasis     Past Surgical History:  Procedure Laterality Date  . ABDOMINAL HYSTERECTOMY  1980  . TRIGGER FINGER RELEASE     Index finger    Social History   Social History  . Marital status: Married    Spouse name: N/A  . Number of children: 0  . Years of education: 67yr colge   Occupational History  . Retired     Social History Main Topics  . Smoking status: Never Smoker  . Smokeless tobacco: Never Used  . Alcohol use No  . Drug use: No  . Sexual activity: No   Other Topics Concern  .  None   Social History Narrative   Married, lives with spouse-retired from [employer] insurance   Not employed    Drinks coffee occasional, Consumes 1 soda a day     Family History  Problem Relation Age of Onset  . Stroke Mother   . Angina Mother   . Diabetes Father   . Hyperlipidemia Other        Parent  . Hypertension Other        Parent  . Diabetes Sister        x1  . CAD Sister   . Diabetes Brother        x 2  . Colon cancer Neg Hx   . Esophageal cancer Neg Hx   . Rectal cancer Neg Hx   . Stomach cancer Neg Hx   . Pancreatic cancer Neg Hx     Review of Systems  Constitutional: Negative for chills and fever.  Respiratory: Positive for cough (chronic) and wheezing. Negative for shortness of breath.   Cardiovascular: Negative for chest pain, palpitations and leg swelling.  Musculoskeletal: Positive for arthralgias.  Neurological: Negative for light-headedness and headaches.       Objective:   Vitals:   08/08/17 1132  BP: 140/64  Pulse: 90  Temp: 98.6 F (37 C)  SpO2: 98%   Wt Readings from Last 3 Encounters:  08/08/17 167 lb (75.8 kg)  07/31/17 167 lb (75.8 kg)  07/10/17 166 lb (75.3 kg)   Body mass index is 32.61 kg/m.   Physical Exam    Constitutional: Appears  well-developed and well-nourished. No distress.  HENT:  Head: Normocephalic and atraumatic.  Neck: Neck supple. No tracheal deviation present. No thyromegaly present.  No cervical lymphadenopathy Cardiovascular: Normal rate, regular rhythm and normal heart sounds.   No murmur heard. No carotid bruit .  No edema Pulmonary/Chest: Effort normal and breath sounds normal. No respiratory distress. No has no wheezes. No rales.  Skin: Skin is warm and dry. Not diaphoretic.  Psychiatric: Normal mood and affect. Behavior is normal.      Assessment & Plan:    See Problem List for Assessment and Plan of chronic medical problems.

## 2017-08-08 ENCOUNTER — Ambulatory Visit (INDEPENDENT_AMBULATORY_CARE_PROVIDER_SITE_OTHER): Payer: Medicare Other | Admitting: Internal Medicine

## 2017-08-08 ENCOUNTER — Other Ambulatory Visit (INDEPENDENT_AMBULATORY_CARE_PROVIDER_SITE_OTHER): Payer: Medicare Other

## 2017-08-08 ENCOUNTER — Encounter: Payer: Self-pay | Admitting: Internal Medicine

## 2017-08-08 VITALS — BP 140/64 | HR 90 | Temp 98.6°F | Ht 60.0 in | Wt 167.0 lb

## 2017-08-08 DIAGNOSIS — E1165 Type 2 diabetes mellitus with hyperglycemia: Secondary | ICD-10-CM

## 2017-08-08 DIAGNOSIS — IMO0002 Reserved for concepts with insufficient information to code with codable children: Secondary | ICD-10-CM

## 2017-08-08 DIAGNOSIS — E1169 Type 2 diabetes mellitus with other specified complication: Secondary | ICD-10-CM | POA: Diagnosis not present

## 2017-08-08 DIAGNOSIS — E785 Hyperlipidemia, unspecified: Secondary | ICD-10-CM | POA: Diagnosis not present

## 2017-08-08 DIAGNOSIS — E1140 Type 2 diabetes mellitus with diabetic neuropathy, unspecified: Secondary | ICD-10-CM

## 2017-08-08 DIAGNOSIS — F418 Other specified anxiety disorders: Secondary | ICD-10-CM | POA: Diagnosis not present

## 2017-08-08 DIAGNOSIS — I10 Essential (primary) hypertension: Secondary | ICD-10-CM | POA: Diagnosis not present

## 2017-08-08 DIAGNOSIS — K219 Gastro-esophageal reflux disease without esophagitis: Secondary | ICD-10-CM

## 2017-08-08 LAB — COMPREHENSIVE METABOLIC PANEL
ALT: 15 U/L (ref 0–35)
AST: 17 U/L (ref 0–37)
Albumin: 4 g/dL (ref 3.5–5.2)
Alkaline Phosphatase: 95 U/L (ref 39–117)
BUN: 17 mg/dL (ref 6–23)
CO2: 28 mEq/L (ref 19–32)
Calcium: 9 mg/dL (ref 8.4–10.5)
Chloride: 106 mEq/L (ref 96–112)
Creatinine, Ser: 0.83 mg/dL (ref 0.40–1.20)
GFR: 86.81 mL/min (ref >60.00)
Glucose, Bld: 109 mg/dL — ABNORMAL HIGH (ref 70–99)
Potassium: 3.7 mEq/L (ref 3.5–5.1)
Sodium: 142 mEq/L (ref 135–145)
Total Bilirubin: 0.3 mg/dL (ref 0.2–1.2)
Total Protein: 7.4 g/dL (ref 6.0–8.3)

## 2017-08-08 LAB — HEMOGLOBIN A1C: Hgb A1c MFr Bld: 6.5 % (ref 4.6–6.5)

## 2017-08-08 MED ORDER — PANTOPRAZOLE SODIUM 40 MG PO TBEC: DELAYED_RELEASE_TABLET | ORAL | 3 refills | Status: DC

## 2017-08-08 NOTE — Assessment & Plan Note (Signed)
Check a1c Low sugar / carb diet Stressed regular exercise, weight loss  

## 2017-08-08 NOTE — Assessment & Plan Note (Signed)
Not controlled D/c omeprazole Restart protonix 40 mg BID If GERD is not improved advised she should see GI - she deferred referral today

## 2017-08-08 NOTE — Assessment & Plan Note (Signed)
BP well controlled Current regimen effective and well tolerated Continue current medications at current doses cmp  

## 2017-08-08 NOTE — Assessment & Plan Note (Signed)
Check lipid panel  Continue daily statin Regular exercise and healthy diet encouraged  

## 2017-08-08 NOTE — Assessment & Plan Note (Signed)
Controlled, stable Continue current dose of medication  

## 2017-08-09 ENCOUNTER — Encounter: Payer: Self-pay | Admitting: Internal Medicine

## 2017-08-15 DIAGNOSIS — H524 Presbyopia: Secondary | ICD-10-CM | POA: Diagnosis not present

## 2017-08-15 DIAGNOSIS — H5212 Myopia, left eye: Secondary | ICD-10-CM | POA: Diagnosis not present

## 2017-08-15 DIAGNOSIS — H2513 Age-related nuclear cataract, bilateral: Secondary | ICD-10-CM | POA: Diagnosis not present

## 2017-08-15 DIAGNOSIS — E119 Type 2 diabetes mellitus without complications: Secondary | ICD-10-CM | POA: Diagnosis not present

## 2017-08-15 DIAGNOSIS — H43813 Vitreous degeneration, bilateral: Secondary | ICD-10-CM | POA: Diagnosis not present

## 2017-08-15 DIAGNOSIS — H52222 Regular astigmatism, left eye: Secondary | ICD-10-CM | POA: Diagnosis not present

## 2017-08-15 DIAGNOSIS — H52221 Regular astigmatism, right eye: Secondary | ICD-10-CM | POA: Diagnosis not present

## 2017-08-15 LAB — HM DIABETES EYE EXAM

## 2017-09-10 DIAGNOSIS — L400 Psoriasis vulgaris: Secondary | ICD-10-CM | POA: Diagnosis not present

## 2017-09-24 ENCOUNTER — Other Ambulatory Visit: Payer: Self-pay | Admitting: Internal Medicine

## 2017-09-29 ENCOUNTER — Ambulatory Visit: Payer: Self-pay

## 2017-09-29 ENCOUNTER — Ambulatory Visit (INDEPENDENT_AMBULATORY_CARE_PROVIDER_SITE_OTHER): Payer: Medicare Other | Admitting: Family Medicine

## 2017-09-29 ENCOUNTER — Encounter: Payer: Self-pay | Admitting: Family Medicine

## 2017-09-29 VITALS — BP 124/50 | HR 91 | Ht 60.0 in | Wt 168.0 lb

## 2017-09-29 DIAGNOSIS — M1711 Unilateral primary osteoarthritis, right knee: Secondary | ICD-10-CM | POA: Diagnosis not present

## 2017-09-29 DIAGNOSIS — M7551 Bursitis of right shoulder: Secondary | ICD-10-CM | POA: Diagnosis not present

## 2017-09-29 DIAGNOSIS — M25512 Pain in left shoulder: Secondary | ICD-10-CM

## 2017-09-29 DIAGNOSIS — M25511 Pain in right shoulder: Secondary | ICD-10-CM | POA: Diagnosis not present

## 2017-09-29 NOTE — Patient Instructions (Signed)
Good to see you  Alvera Singh is your friend.  Stay active.  I hope the injections help Make an appointment for 2-3 weeks and if not a lot better we wil consider injection the left knee.  Happy holidays!

## 2017-09-29 NOTE — Assessment & Plan Note (Signed)
Patient given injection of the right knee again.  Has tolerated the procedure previously fairly well.  Hopefully patient will have another good response.  We discussed icing regimen, home exercise, which activities to do which wants to avoid.  Patient will increase activity slowly over the course the next several days.  Follow-up with me again 4-6 weeks

## 2017-09-29 NOTE — Progress Notes (Signed)
[name] [address] [place], [place] [address] Phone: [phone] Subjective:    I'm seeing this patient by the request  of:    CC: Right shoulder pain, right knee pain  XLK:[id number]  Heidi Franklin is a 71 y.o. female coming in with complaint of right shoulder pain.  Describes pain as a dull, throbbing aching sensation that is waking her up at night.  Rates the severity of pain is 8 out of 10.  Patient states that it is severe and sometimes affecting daily activities such as dressing.  Does not remember any true injury but had been doing a lot more manual labor recently.  Patient is also having right knee pain.  Known degenerative stability.     Past Medical History:  Diagnosis Date  . ALLERGIC RHINITIS   . Allergy   . Anxiety state, unspecified   . Arthritis   . BRONCHITIS, CHRONIC   . DEPRESSION   . DIABETES MELLITUS, TYPE II   . GERD   . HYPERLIPIDEMIA   . HYPERTENSION   . Psoriasis    Past Surgical History:  Procedure Laterality Date  . ABDOMINAL HYSTERECTOMY  1980  . TRIGGER FINGER RELEASE     Index finger   Social History   Socioeconomic History  . Marital status: Married    Spouse name: None  . Number of children: 0  . Years of education: 50yr colge  . Highest education level: None  Social Needs  . Financial resource strain: None  . Food insecurity - worry: None  . Food insecurity - inability: None  . Transportation needs - medical: None  . Transportation needs - non-medical: None  Occupational History  . Occupation: Retired   Tobacco Use  . Smoking status: Never Smoker  . Smokeless tobacco: Never Used  Substance and Sexual Activity  . Alcohol use: No    Alcohol/week: 0.0 oz  . Drug use: No  . Sexual activity: No  Other Topics Concern  . None  Social History Narrative   Married, lives with spouse-retired from [employer] insurance   Not employed    Drinks coffee occasional, Consumes 1 soda a day    Allergies    Allergen Reactions  . Metformin And Related Diarrhea  . Penicillins Rash   Family History  Problem Relation Age of Onset  . Stroke Mother   . Angina Mother   . Diabetes Father   . Hyperlipidemia Other        Parent  . Hypertension Other        Parent  . Diabetes Sister        x1  . CAD Sister   . Diabetes Brother        x 2  . Colon cancer Neg Hx   . Esophageal cancer Neg Hx   . Rectal cancer Neg Hx   . Stomach cancer Neg Hx   . Pancreatic cancer Neg Hx      Past medical history, social, surgical and family history all reviewed in electronic medical record.  No pertanent information unless stated regarding to the chief complaint.   Review of Systems:Review of systems updated and as accurate as of 09/29/17  No headache, visual changes, nausea, vomiting, diarrhea, constipation, dizziness, abdominal pain, skin rash, fevers, chills, night sweats, weight loss, swollen lymph nodes, body aches, joint swelling, muscle aches, chest pain, shortness of breath, mood changes.   Objective  Blood pressure (!) 124/50, pulse 91, height 5' (1.524 m),  weight 168 lb (76.2 kg), SpO2 98 %. Systems examined below as of 09/29/17   General: No apparent distress alert and oriented x3 mood and affect normal, dressed appropriately.  HEENT: Pupils equal, extraocular movements intact  Respiratory: Patient's speak in full sentences and does not appear short of breath  Cardiovascular: No lower extremity edema, non tender, no erythema  Skin: Warm dry intact with no signs of infection or rash on extremities or on axial skeleton.  Abdomen: Soft nontender  Neuro: Cranial nerves II through XII are intact, neurovascularly intact in all extremities with 2+ DTRs and 2+ pulses.  Lymph: No lymphadenopathy of posterior or anterior cervical chain or axillae bilaterally.  Gait mild antalgic gait MSK:  Non tender with full range of motion and good stability and symmetric strength and tone of , elbows, wrist, hip,  and ankles bilaterally.  Mild to moderate arthritic changes of multiple joints  Knee: Right valgus deformity noted. Large thigh to calf ratio.  Tender to palpation over medial and PF joint line.  ROM full in flexion and extension and lower leg rotation. instability with valgus force.  painful patellar compression. Patellar glide with moderate crepitus. Patellar and quadriceps tendons unremarkable. Hamstring and quadriceps strength is normal. Contralateral knee shows arthritic changes with mild instability but no pain  Shoulder: Right Inspection reveals no abnormalities, atrophy or asymmetry. Palpation is normal with no tenderness over AC joint or bicipital groove. ROM is full in all planes passively. Rotator cuff strength normal throughout. signs of impingement with positive Neer and Hawkin's tests, but negative empty can sign. Speeds and Yergason's tests normal. No labral pathology noted with negative Obrien's, negative clunk and good stability. Normal scapular function observed. No painful arc and no drop arm sign. No apprehension sign  MSK US performed of: Right This study was ordered, performed, and interpreted by Charlann Boxer D.O.  Shoulder:   Supraspinatus:  Appears normal on long and transverse views, Bursal bulge seen with shoulder abduction on impingement view. Infraspinatus:  Appears normal on long and transverse views. Significant increase in Doppler flow Subscapularis:  Appears normal on long and transverse views. Positive bursa Teres Minor:  Appears normal on long and transverse views. AC joint:  Capsule undistended, no geyser sign. Glenohumeral Joint:  Appears normal without effusion. Glenoid Labrum:  Intact without visualized tears. Biceps Tendon:  Appears normal on long and transverse views, no fraying of tendon, tendon located in intertubercular groove, no subluxation with shoulder internal or external rotation.  Impression: Subacromial bursitis  Procedure:  Real-time Ultrasound Guided Injection of right glenohumeral joint Device: GE Logiq E  Ultrasound guided injection is preferred based studies that show increased duration, increased effect, greater accuracy, decreased procedural pain, increased response rate with ultrasound guided versus blind injection.  Verbal informed consent obtained.  Time-out conducted.  Noted no overlying erythema, induration, or other signs of local infection.  Skin prepped in a sterile fashion.  Local anesthesia: Topical Ethyl chloride.  With sterile technique and under real time ultrasound guidance:  Joint visualized.  23g 1  inch needle inserted posterior approach. Pictures taken for needle placement. Patient did have injection of 2 cc of 1% lidocaine, 2 cc of 0.5% Marcaine, and 1.0 cc of Kenalog 40 mg/dL. Completed without difficulty  Pain immediately resolved suggesting accurate placement of the medication.  Advised to call if fevers/chills, erythema, induration, drainage, or persistent bleeding.  Images permanently stored and available for review in the ultrasound unit.  Impression: Technically successful ultrasound guided injection.  After informed written and verbal consent, patient was seated on exam table. Left knee was prepped with alcohol swab and utilizing anterolateral approach, patient's left knee space was injected with 4:1  marcaine 0.5%: Kenalog 40mg /dL. Patient tolerated the procedure well without immediate complications.   Impression and Recommendations:     This case required medical decision making of moderate complexity.      Note: This dictation was prepared with Dragon dictation along with smaller phrase technology. Any transcriptional errors that result from this process are unintentional.

## 2017-09-29 NOTE — Assessment & Plan Note (Signed)
Patient was given an injection.  Tolerated the procedure well.  Exercises.  We discussed which activities to doing which wants to avoid.  Patient will increase activity slowly over the course the next several days.  Worsening symptoms we will consider x-rays and formal physical therapy.

## 2017-10-15 ENCOUNTER — Ambulatory Visit: Payer: Medicare Other | Admitting: Family Medicine

## 2017-10-29 ENCOUNTER — Telehealth: Payer: Self-pay | Admitting: Internal Medicine

## 2017-10-29 NOTE — Telephone Encounter (Signed)
Copied from [place] [phone]. Topic: Quick Communication - See Telephone Encounter >> Oct 29, 2017 12:48 PM Synthia Innocent wrote: CRM for notification. See Telephone encounter for:  Having a twitch in her shoulder, only when she is laying down. Believes it maybe nerves. Please advise 10/29/17.

## 2017-10-29 NOTE — Telephone Encounter (Signed)
Left message for pt to call office back to discuss symptoms of shoulder.

## 2017-10-30 ENCOUNTER — Ambulatory Visit: Payer: Self-pay | Admitting: *Deleted

## 2017-10-30 NOTE — Telephone Encounter (Signed)
Not urgent - ok to wait until next week

## 2017-10-30 NOTE — Telephone Encounter (Signed)
LVM for pt to call back and discuss, pt has an appt on Jan 14th but can call back if needing to move up

## 2017-10-30 NOTE — Telephone Encounter (Signed)
Pt only has twitching in her right shoulder when she is trying to sleep. She has denied swelling, tingling, pain or numbness in that shoulder or arm. She does have bursitis in both shoulders. Requested an appointment with her pcp. Appointment made for Jan 14 th .  Home care advice given to patient with verbal understanding.  Answer Assessment - Initial Assessment Questions 1. SYMPTOM: "What is the main symptom you are concerned about?" (e.g., weakness, numbness)     twitching 2. ONSET: "When did this start?" (minutes, hours, days; while sleeping)      A month ago and now has gotten worse when trying to sleep. 3. LAST NORMAL: "When was the last time you were normal (no symptoms)?"     november 4. PATTERN "Does this come and go, or has it been constant since it started?"  "Is it present now?"     Comes and goes 5. CARDIAC SYMPTOMS: "Have you had any of the following symptoms: chest pain, difficulty breathing, palpitations?"     no 6. NEUROLOGIC SYMPTOMS: "Have you had any of the following symptoms: headache, dizziness, vision loss, double vision, changes in speech, unsteady on your feet?"     no 7. OTHER SYMPTOMS: "Do you have any other symptoms?"     no 8. PREGNANCY: "Is there any chance you are pregnant?" "When was your last menstrual period?"     no  Protocols used: NEUROLOGIC DEFICIT-A-AH

## 2017-10-30 NOTE — Telephone Encounter (Signed)
Is this Urgent or can I schedule pt next week?

## 2017-11-06 DIAGNOSIS — L400 Psoriasis vulgaris: Secondary | ICD-10-CM | POA: Diagnosis not present

## 2017-11-06 DIAGNOSIS — D485 Neoplasm of uncertain behavior of skin: Secondary | ICD-10-CM | POA: Diagnosis not present

## 2017-11-08 NOTE — Progress Notes (Deleted)
Subjective:    Patient ID: Heidi Franklin, female    DOB: [birth year], 72 y.o.   MRN: [id number]  HPI She is here for an acute visit.   Twitching right shoulder when trying to sleep:      Medications and allergies reviewed with patient and updated if appropriate.  Patient Active Problem List   Diagnosis Date Noted  . Acute bursitis of right shoulder 09/29/2017  . Bilateral hip pain 07/10/2017  . Bronchiectasis without complication ([place]) 61/44/3154  . Irritable larynx syndrome 04/25/2017  . Benign lipomatous neoplasm of skin and subcutaneous tissue of right leg 02/25/2017  . Anemia 02/05/2017  . Fatigue 01/23/2017  . Chronic cough 01/02/2017  . Multiple pulmonary nodules 11/10/2016  . Acute midline low back pain with right-sided sciatica 10/09/2016  . Moderate right ankle sprain 09/17/2016  . Fall 08/08/2016  . Neck pain 07/29/2016  . Degenerative arthritis of right knee 06/20/2016  . Foot pain, left 05/28/2016  . Degenerative arthritis of left knee 04/10/2016  . Peroneal tendinitis of left lower extremity 09/05/2015  . Recurrent falls 08/08/2015  . Bursitis of left shoulder 07/13/2015  . Morbid obesity due to excess calories ([place]) 04/16/2015  . Nonspecific abnormal electrocardiogram (ECG) (EKG) 05/11/2011  . Tenosynovitis of finger 04/22/2011  . Type 2 diabetes, uncontrolled, with neuropathy ([place]) 03/02/2010  . Hyperlipidemia associated with type 2 diabetes mellitus ([place]) 03/02/2010  . Depression with anxiety 03/02/2010  . Essential hypertension 03/02/2010  . Chronic rhinitis 03/02/2010  . Upper airway cough syndrome 03/02/2010  . GERD 03/02/2010    Current Outpatient Medications on File Prior to Visit  Medication Sig Dispense Refill  . acetaminophen (TYLENOL) 325 MG tablet Take 650 mg by mouth every 6 (six) hours as needed.    Marland Kitchen albuterol (VENTOLIN HFA) 108 (90 Base) MCG/ACT inhaler Inhale 1-2 puffs into the lungs every 6 (six) hours as needed for wheezing or  shortness of breath. 1 Inhaler 3  . ALPRAZolam (XANAX) 0.5 MG tablet take 1 tablet by mouth every 8 hours if needed for anxiety 30 tablet 0  . amLODipine (NORVASC) 10 MG tablet TAKE 1 TABLET EVERY DAY 90 tablet 1  . aspirin 81 MG tablet Take 81 mg by mouth every morning.     . Calcium Citrate-Vitamin D (CALCIUM + D PO) Take 1 tablet by mouth every morning.    . chlorpheniramine (CHLOR-TRIMETON) 4 MG tablet Take 2 tabs by mouth at bedtime and every 4 hours as needed for drainage/drippy nose/sneezing    . famotidine (PEPCID) 20 MG tablet Take 1 tablet (20 mg total) by mouth at bedtime. 30 tablet 11  . fluocinonide ointment (LIDEX) 0.08 % Apply 1 application topically as needed.     . fluticasone (FLONASE) 50 MCG/ACT nasal spray USE 1 SPRAY IN EACH NOSTRIL TWICE DAILY 48 g 1  . Fluticasone-Umeclidin-Vilant (TRELEGY ELLIPTA) 100-62.5-25 MCG/INH AEPB Inhale 1 puff into the lungs daily. 1 each 0  . glucose blood (ONE TOUCH ULTRA TEST) test strip 1 each by Other route daily. Use 1 strips to check blood sugar twice a day Ex E11.9 100 each 3  . glyBURIDE (DIABETA) 2.5 MG tablet TAKE 1 TABLET EVERY DAY WITH BREAKFAST 90 tablet 1  . ibuprofen (ADVIL,MOTRIN) 600 MG tablet Take 1 tablet (600 mg total) by mouth every 6 (six) hours as needed. 30 tablet 0  . Lancets (ONETOUCH ULTRASOFT) lancets PRN 100 each 3  . losartan-hydrochlorothiazide (HYZAAR) 100-12.5 MG tablet Take 1 tablet by mouth daily. [place]  tablet 0  . lovastatin (MEVACOR) 20 MG tablet TAKE 1 TABLET AT BEDTIME 90 tablet 1  . mometasone (ELOCON) 0.1 % ointment     . montelukast (SINGULAIR) 10 MG tablet Take 1 tablet (10 mg total) by mouth at bedtime. 30 tablet 3  . Multiple Vitamins-Minerals (CENTRUM SILVER PO) Take 1 tablet by mouth every morning.    . nortriptyline (PAMELOR) 10 MG capsule Take 1-2 capsules (10-20 mg total) by mouth at bedtime. 60 capsule 1  . omeprazole (PRILOSEC) 40 MG capsule TAKE 1 CAPSULE EVERY DAY 90 capsule 1  . pantoprazole  (PROTONIX) 40 MG tablet TAKE 1 TABLET TWICE DAILY BEFORE MEALS 180 tablet 3  . Potassium Chloride ER 20 MEQ TBCR TAKE 1 TABLET EVERY DAY 90 tablet 3  . Respiratory Therapy Supplies (FLUTTER) DEVI Use as directed 1 each 0  . sertraline (ZOLOFT) 100 MG tablet TAKE 1 TABLET EVERY MORNING 90 tablet 1  . Vitamin D, Ergocalciferol, (DRISDOL) 50000 units CAPS capsule Take 1 capsule (50,000 Units total) by mouth every 7 (seven) days. 12 capsule 0   No current facility-administered medications on file prior to visit.     Past Medical History:  Diagnosis Date  . ALLERGIC RHINITIS   . Allergy   . Anxiety state, unspecified   . Arthritis   . BRONCHITIS, CHRONIC   . DEPRESSION   . DIABETES MELLITUS, TYPE II   . GERD   . HYPERLIPIDEMIA   . HYPERTENSION   . Psoriasis     Past Surgical History:  Procedure Laterality Date  . ABDOMINAL HYSTERECTOMY  1980  . TRIGGER FINGER RELEASE     Index finger    Social History   Socioeconomic History  . Marital status: Married    Spouse name: Not on file  . Number of children: 0  . Years of education: 40yr colge  . Highest education level: Not on file  Social Needs  . Financial resource strain: Not on file  . Food insecurity - worry: Not on file  . Food insecurity - inability: Not on file  . Transportation needs - medical: Not on file  . Transportation needs - non-medical: Not on file  Occupational History  . Occupation: Retired   Tobacco Use  . Smoking status: Never Smoker  . Smokeless tobacco: Never Used  Substance and Sexual Activity  . Alcohol use: No    Alcohol/week: 0.0 oz  . Drug use: No  . Sexual activity: No  Other Topics Concern  . Not on file  Social History Narrative   Married, lives with spouse-retired from [employer] insurance   Not employed    Drinks coffee occasional, Consumes 1 soda a day     Family History  Problem Relation Age of Onset  . Stroke Mother   . Angina Mother   . Diabetes Father   . Hyperlipidemia Other         Parent  . Hypertension Other        Parent  . Diabetes Sister        x1  . CAD Sister   . Diabetes Brother        x 2  . Colon cancer Neg Hx   . Esophageal cancer Neg Hx   . Rectal cancer Neg Hx   . Stomach cancer Neg Hx   . Pancreatic cancer Neg Hx     Review of Systems     Objective:  There were no vitals filed for this visit. Wt Readings from  Last 3 Encounters:  09/29/17 168 lb (76.2 kg)  08/08/17 167 lb (75.8 kg)  07/31/17 167 lb (75.8 kg)   There is no height or weight on file to calculate BMI.   Physical Exam          Assessment & Plan:    See Problem List for Assessment and Plan of chronic medical problems.

## 2017-11-10 ENCOUNTER — Ambulatory Visit: Payer: Medicare Other | Admitting: Internal Medicine

## 2017-11-13 NOTE — Progress Notes (Signed)
Subjective:    Patient ID: Heidi Franklin, female    DOB: November 24, 1945, 72 y.o.   MRN: [id number]  HPI She is here for an acute visit.   Twitching right shoulder when trying to sleep: This started approximately 2 weeks ago.  When she has her head on her husbands shoulder or on a pillow.  She twitches her shoulder.  Her husband thought she was just trying to get comfortable.  She denies any neck pain or shoulder pain.  She does have some intermittent shoulder pain, but but this is from bursitis and not actively hurting.  She denies neck stiffness.  She has occasionally had the twitching during the day, but most often it is seen at night.  She denies headaches, lightheadedness, dizziness, numbness/tingling, tremors or weakness in her arms.  She does have some chronic joint pain, but nothing new.  She is typically very active throughout the day.  There has been some increased stress in her life and her husband worries about that.     Medications and allergies reviewed with patient and updated if appropriate.  Patient Active Problem List   Diagnosis Date Noted  . Acute bursitis of right shoulder 09/29/2017  . Bilateral hip pain 07/10/2017  . Bronchiectasis without complication ([place]) 82/50/5397  . Irritable larynx syndrome 04/25/2017  . Benign lipomatous neoplasm of skin and subcutaneous tissue of right leg 02/25/2017  . Anemia 02/05/2017  . Fatigue 01/23/2017  . Chronic cough 01/02/2017  . Multiple pulmonary nodules 11/10/2016  . Acute midline low back pain with right-sided sciatica 10/09/2016  . Moderate right ankle sprain 09/17/2016  . Fall 08/08/2016  . Neck pain 07/29/2016  . Degenerative arthritis of right knee 06/20/2016  . Foot pain, left 05/28/2016  . Degenerative arthritis of left knee 04/10/2016  . Peroneal tendinitis of left lower extremity 09/05/2015  . Recurrent falls 08/08/2015  . Bursitis of left shoulder 07/13/2015  . Morbid obesity due to excess calories ([place])  04/16/2015  . Nonspecific abnormal electrocardiogram (ECG) (EKG) 05/11/2011  . Tenosynovitis of finger 04/22/2011  . Type 2 diabetes, uncontrolled, with neuropathy ([place]) 03/02/2010  . Hyperlipidemia associated with type 2 diabetes mellitus ([place]) 03/02/2010  . Depression with anxiety 03/02/2010  . Essential hypertension 03/02/2010  . Chronic rhinitis 03/02/2010  . Upper airway cough syndrome 03/02/2010  . GERD 03/02/2010    Current Outpatient Medications on File Prior to Visit  Medication Sig Dispense Refill  . acetaminophen (TYLENOL) 325 MG tablet Take 650 mg by mouth every 6 (six) hours as needed.    Marland Kitchen albuterol (VENTOLIN HFA) 108 (90 Base) MCG/ACT inhaler Inhale 1-2 puffs into the lungs every 6 (six) hours as needed for wheezing or shortness of breath. 1 Inhaler 3  . ALPRAZolam (XANAX) 0.5 MG tablet take 1 tablet by mouth every 8 hours if needed for anxiety 30 tablet 0  . amLODipine (NORVASC) 10 MG tablet TAKE 1 TABLET EVERY DAY 90 tablet 1  . aspirin 81 MG tablet Take 81 mg by mouth every morning.     . Calcium Citrate-Vitamin D (CALCIUM + D PO) Take 1 tablet by mouth every morning.    . chlorpheniramine (CHLOR-TRIMETON) 4 MG tablet Take 2 tabs by mouth at bedtime and every 4 hours as needed for drainage/drippy nose/sneezing    . famotidine (PEPCID) 20 MG tablet Take 1 tablet (20 mg total) by mouth at bedtime. 30 tablet 11  . fluocinonide ointment (LIDEX) 6.73 % Apply 1 application topically as needed.     Marland Kitchen  fluticasone (FLONASE) 50 MCG/ACT nasal spray USE 1 SPRAY IN EACH NOSTRIL TWICE DAILY 48 g 1  . Fluticasone-Umeclidin-Vilant (TRELEGY ELLIPTA) 100-62.5-25 MCG/INH AEPB Inhale 1 puff into the lungs daily. 1 each 0  . glucose blood (ONE TOUCH ULTRA TEST) test strip 1 each by Other route daily. Use 1 strips to check blood sugar twice a day Ex E11.9 100 each 3  . glyBURIDE (DIABETA) 2.5 MG tablet TAKE 1 TABLET EVERY DAY WITH BREAKFAST 90 tablet 1  . ibuprofen (ADVIL,MOTRIN) 600 MG  tablet Take 1 tablet (600 mg total) by mouth every 6 (six) hours as needed. 30 tablet 0  . Lancets (ONETOUCH ULTRASOFT) lancets PRN 100 each 3  . losartan-hydrochlorothiazide (HYZAAR) 100-12.5 MG tablet Take 1 tablet by mouth daily. 90 tablet 0  . lovastatin (MEVACOR) 20 MG tablet TAKE 1 TABLET AT BEDTIME 90 tablet 1  . mometasone (ELOCON) 0.1 % ointment     . montelukast (SINGULAIR) 10 MG tablet Take 1 tablet (10 mg total) by mouth at bedtime. 30 tablet 3  . Multiple Vitamins-Minerals (CENTRUM SILVER PO) Take 1 tablet by mouth every morning.    . nortriptyline (PAMELOR) 10 MG capsule Take 1-2 capsules (10-20 mg total) by mouth at bedtime. 60 capsule 1  . omeprazole (PRILOSEC) 40 MG capsule TAKE 1 CAPSULE EVERY DAY 90 capsule 1  . pantoprazole (PROTONIX) 40 MG tablet TAKE 1 TABLET TWICE DAILY BEFORE MEALS 180 tablet 3  . Potassium Chloride ER 20 MEQ TBCR TAKE 1 TABLET EVERY DAY 90 tablet 3  . Respiratory Therapy Supplies (FLUTTER) DEVI Use as directed 1 each 0  . sertraline (ZOLOFT) 100 MG tablet TAKE 1 TABLET EVERY MORNING 90 tablet 1  . Vitamin D, Ergocalciferol, (DRISDOL) 50000 units CAPS capsule Take 1 capsule (50,000 Units total) by mouth every 7 (seven) days. 12 capsule 0   No current facility-administered medications on file prior to visit.     Past Medical History:  Diagnosis Date  . ALLERGIC RHINITIS   . Allergy   . Anxiety state, unspecified   . Arthritis   . BRONCHITIS, CHRONIC   . DEPRESSION   . DIABETES MELLITUS, TYPE II   . GERD   . HYPERLIPIDEMIA   . HYPERTENSION   . Psoriasis     Past Surgical History:  Procedure Laterality Date  . ABDOMINAL HYSTERECTOMY  1980  . TRIGGER FINGER RELEASE     Index finger    Social History   Socioeconomic History  . Marital status: Married    Spouse name: Not on file  . Number of children: 0  . Years of education: 85yr colge  . Highest education level: Not on file  Social Needs  . Financial resource strain: Not on file  .  Food insecurity - worry: Not on file  . Food insecurity - inability: Not on file  . Transportation needs - medical: Not on file  . Transportation needs - non-medical: Not on file  Occupational History  . Occupation: Retired   Tobacco Use  . Smoking status: Never Smoker  . Smokeless tobacco: Never Used  Substance and Sexual Activity  . Alcohol use: No    Alcohol/week: 0.0 oz  . Drug use: No  . Sexual activity: No  Other Topics Concern  . Not on file  Social History Narrative   Married, lives with spouse-retired from [employer] insurance   Not employed    Drinks coffee occasional, Consumes 1 soda a day     Family History  Problem  Relation Age of Onset  . Stroke Mother   . Angina Mother   . Diabetes Father   . Hyperlipidemia Other        Parent  . Hypertension Other        Parent  . Diabetes Sister        x1  . CAD Sister   . Diabetes Brother        x 2  . Colon cancer Neg Hx   . Esophageal cancer Neg Hx   . Rectal cancer Neg Hx   . Stomach cancer Neg Hx   . Pancreatic cancer Neg Hx     Review of Systems  Musculoskeletal: Positive for arthralgias (right shoulder - bursitis- intermittent). Negative for neck pain and neck stiffness.  Neurological: Negative for dizziness, tremors, weakness, light-headedness, numbness and headaches.       Objective:   Vitals:   11/14/17 1019  BP: 128/62  Pulse: 97  Resp: 16  Temp: 98.3 F (36.8 C)  SpO2: 98%   Wt Readings from Last 3 Encounters:  11/14/17 163 lb (73.9 kg)  09/29/17 168 lb (76.2 kg)  08/08/17 167 lb (75.8 kg)   Body mass index is 31.83 kg/m.   Physical Exam  Constitutional: She is oriented to person, place, and time. She appears well-developed and well-nourished. No distress.  HENT:  Head: Normocephalic and atraumatic.  Musculoskeletal: Normal range of motion. She exhibits no edema.  No tenderness of right shoulder joint and no pain of shoulder joint with movement, full range of motion of right shoulder,  mild tenderness bilateral upper trapezius muscles  Neurological: She is alert and oriented to person, place, and time. She exhibits abnormal muscle tone (Muscle tightness upper back).  Normal sensation and strength bilateral upper extremities  Skin: Skin is warm and dry. She is not diaphoretic. No erythema.            Assessment & Plan:    See Problem List for Assessment and Plan of chronic medical problems.

## 2017-11-14 ENCOUNTER — Encounter: Payer: Self-pay | Admitting: Internal Medicine

## 2017-11-14 ENCOUNTER — Ambulatory Visit (INDEPENDENT_AMBULATORY_CARE_PROVIDER_SITE_OTHER): Payer: Medicare Other | Admitting: Internal Medicine

## 2017-11-14 VITALS — BP 128/62 | HR 97 | Temp 98.3°F | Resp 16 | Wt 163.0 lb

## 2017-11-14 DIAGNOSIS — M6289 Other specified disorders of muscle: Secondary | ICD-10-CM | POA: Diagnosis not present

## 2017-11-14 DIAGNOSIS — L409 Psoriasis, unspecified: Secondary | ICD-10-CM | POA: Insufficient documentation

## 2017-11-14 DIAGNOSIS — R253 Fasciculation: Secondary | ICD-10-CM | POA: Insufficient documentation

## 2017-11-14 NOTE — Assessment & Plan Note (Signed)
Bilateral upper back muscle tightness Possibly related to increased stress overall given the lack of other symptoms Deferred muscle relaxer Tried heat, anti-inflammatory if needed, stretching and regular exercise Discussed stress management  If no improvement she will let me know so we can refer to sports medicine

## 2017-11-14 NOTE — Patient Instructions (Addendum)
Apply heat and stretch your upper back. Start regular exercise.  If there is no improvement please return.     Stress and Stress Management Stress is a normal reaction to life events. It is what you feel when life demands more than you are used to or more than you can handle. Some stress can be useful. For example, the stress reaction can help you catch the last bus of the day, study for a test, or meet a deadline at work. But stress that occurs too often or for too long can cause problems. It can affect your emotional health and interfere with relationships and normal daily activities. Too much stress can weaken your immune system and increase your risk for physical illness. If you already have a medical problem, stress can make it worse. What are the causes? All sorts of life events may cause stress. An event that causes stress for one person may not be stressful for another person. Major life events commonly cause stress. These may be positive or negative. Examples include losing your job, moving into a new home, getting married, having a baby, or losing a loved one. Less obvious life events may also cause stress, especially if they occur day after day or in combination. Examples include working long hours, driving in traffic, caring for children, being in debt, or being in a difficult relationship. What are the signs or symptoms? Stress may cause emotional symptoms including, the following:  Anxiety. This is feeling worried, afraid, on edge, overwhelmed, or out of control.  Anger. This is feeling irritated or impatient.  Depression. This is feeling sad, down, helpless, or guilty.  Difficulty focusing, remembering, or making decisions.  Stress may cause physical symptoms, including the following:  Aches and pains. These may affect your head, neck, back, stomach, or other areas of your body.  Tight muscles or clenched jaw.  Low energy or trouble sleeping.  Stress may cause unhealthy  behaviors, including the following:  Eating to feel better (overeating) or skipping meals.  Sleeping too little, too much, or both.  Working too much or putting off tasks (procrastination).  Smoking, drinking alcohol, or using drugs to feel better.  How is this diagnosed? Stress is diagnosed through an assessment by your health care provider. Your health care provider will ask questions about your symptoms and any stressful life events.Your health care provider will also ask about your medical history and may order blood tests or other tests. Certain medical conditions and medicine can cause physical symptoms similar to stress. Mental illness can cause emotional symptoms and unhealthy behaviors similar to stress. Your health care provider may refer you to a mental health professional for further evaluation. How is this treated? Stress management is the recommended treatment for stress.The goals of stress management are reducing stressful life events and coping with stress in healthy ways. Techniques for reducing stressful life events include the following:  Stress identification. Self-monitor for stress and identify what causes stress for you. These skills may help you to avoid some stressful events.  Time management. Set your priorities, keep a calendar of events, and learn to say "no." These tools can help you avoid making too many commitments.  Techniques for coping with stress include the following:  Rethinking the problem. Try to think realistically about stressful events rather than ignoring them or overreacting. Try to find the positives in a stressful situation rather than focusing on the negatives.  Exercise. Physical exercise can release both physical and emotional tension. The  key is to find a form of exercise you enjoy and do it regularly.  Relaxation techniques. These relax the body and mind. Examples include yoga, meditation, tai chi, biofeedback, deep breathing, progressive  muscle relaxation, listening to music, being out in nature, journaling, and other hobbies. Again, the key is to find one or more that you enjoy and can do regularly.  Healthy lifestyle. Eat a balanced diet, get plenty of sleep, and do not smoke. Avoid using alcohol or drugs to relax.  Strong support network. Spend time with family, friends, or other people you enjoy being around.Express your feelings and talk things over with someone you trust.  Counseling or talktherapy with a mental health professional may be helpful if you are having difficulty managing stress on your own. Medicine is typically not recommended for the treatment of stress.Talk to your health care provider if you think you need medicine for symptoms of stress. Follow these instructions at home:  Keep all follow-up visits as directed by your health care provider.  Take all medicines as directed by your health care provider. Contact a health care provider if:  Your symptoms get worse or you start having new symptoms.  You feel overwhelmed by your problems and can no longer manage them on your own. Get help right away if:  You feel like hurting yourself or someone else. This information is not intended to replace advice given to you by your health care provider. Make sure you discuss any questions you have with your health care provider. Document Released: 04/09/2001 Document Revised: 03/21/2016 Document Reviewed: 06/08/2013 Elsevier Interactive Patient Education  2017 [employer].

## 2017-11-14 NOTE — Assessment & Plan Note (Signed)
Right shoulder twitching/muscle twitching primarily at night associated with any concerning symptoms Possibly related to increased stress overall given the lack of other symptoms She does have some muscle tightness on exam Deferred muscle relaxer Tried heat, anti-inflammatory if needed, stretching and regular exercise Discussed stress management

## 2017-11-19 ENCOUNTER — Encounter: Payer: Self-pay | Admitting: Family Medicine

## 2017-11-19 MED ORDER — VITAMIN D (ERGOCALCIFEROL) 1.25 MG (50000 UNIT) PO CAPS: 50000.0000 [IU] | ORAL_CAPSULE | ORAL | 0 refills | Status: DC

## 2017-11-21 ENCOUNTER — Encounter: Payer: Self-pay | Admitting: Internal Medicine

## 2017-11-21 ENCOUNTER — Ambulatory Visit (INDEPENDENT_AMBULATORY_CARE_PROVIDER_SITE_OTHER): Payer: Medicare Other | Admitting: Internal Medicine

## 2017-11-21 VITALS — BP 124/60 | HR 90 | Temp 98.9°F | Resp 16 | Wt 164.0 lb

## 2017-11-21 DIAGNOSIS — S0990XA Unspecified injury of head, initial encounter: Secondary | ICD-10-CM | POA: Diagnosis not present

## 2017-11-21 NOTE — Assessment & Plan Note (Signed)
She hit her head twice in the past week in the same area-right posterior head She has a mild headache in that region, but no other concerning symptoms Advil does relieve the pain and she will continue to take Advil or Tylenol as needed, but will only take as needed If she experiences any confusion, blurry vision, nausea, worsening of her headache I did advise her to go to the emergency room.  Discussed that she may have a mild concussion No neurological deficits or concerning symptoms so we will hold off on imaging

## 2017-11-21 NOTE — Progress Notes (Signed)
Subjective:    Patient ID: Heidi Franklin, female    DOB: 09/22/1946, 72 y.o.   MRN: [id number]  HPI She is here for an acute visit.   She has hit her head twice in the past week.    Last night she was in the shower and bent over.  As she came up she hit her right posterior head on the glass shower door handle.  She did have headache after that and did take ibuprofen.  She woke up this morning with a slight headache and has a mild headache now as well.  Ibuprofen does seem to help.  The headache is localized to the right side and is not generalized.  One week ago she also hit her head in the same location.  She bends down in the bedroom and when she came up she hit her head on the bed frame.  There was no loss of consciousness with either episode.  She is not experienced any new dizziness, but has had dizziness intermittently for a while.  She denies any lightheadedness, blurry vision, nausea or confusion.   She feels a little off balance, but that is not new.  She is unsure if it slightly worse.     Medications and allergies reviewed with patient and updated if appropriate.  Patient Active Problem List   Diagnosis Date Noted  . Psoriasis 11/14/2017  . Muscle tightness 11/14/2017  . Muscle twitching 11/14/2017  . Acute bursitis of right shoulder 09/29/2017  . Bilateral hip pain 07/10/2017  . Bronchiectasis without complication ([place]) 33/29/5188  . Irritable larynx syndrome 04/25/2017  . Benign lipomatous neoplasm of skin and subcutaneous tissue of right leg 02/25/2017  . Anemia 02/05/2017  . Fatigue 01/23/2017  . Chronic cough 01/02/2017  . Multiple pulmonary nodules 11/10/2016  . Acute midline low back pain with right-sided sciatica 10/09/2016  . Moderate right ankle sprain 09/17/2016  . Fall 08/08/2016  . Neck pain 07/29/2016  . Degenerative arthritis of right knee 06/20/2016  . Foot pain, left 05/28/2016  . Degenerative arthritis of left knee 04/10/2016  . Peroneal  tendinitis of left lower extremity 09/05/2015  . Recurrent falls 08/08/2015  . Bursitis of left shoulder 07/13/2015  . Morbid obesity due to excess calories ([place]) 04/16/2015  . Nonspecific abnormal electrocardiogram (ECG) (EKG) 05/11/2011  . Tenosynovitis of finger 04/22/2011  . Type 2 diabetes, uncontrolled, with neuropathy ([place]) 03/02/2010  . Hyperlipidemia associated with type 2 diabetes mellitus ([place]) 03/02/2010  . Depression with anxiety 03/02/2010  . Essential hypertension 03/02/2010  . Chronic rhinitis 03/02/2010  . Upper airway cough syndrome 03/02/2010  . GERD 03/02/2010    Current Outpatient Medications on File Prior to Visit  Medication Sig Dispense Refill  . acetaminophen (TYLENOL) 325 MG tablet Take 650 mg by mouth every 6 (six) hours as needed.    Marland Kitchen albuterol (VENTOLIN HFA) 108 (90 Base) MCG/ACT inhaler Inhale 1-2 puffs into the lungs every 6 (six) hours as needed for wheezing or shortness of breath. 1 Inhaler 3  . ALPRAZolam (XANAX) 0.5 MG tablet take 1 tablet by mouth every 8 hours if needed for anxiety 30 tablet 0  . amLODipine (NORVASC) 10 MG tablet TAKE 1 TABLET EVERY DAY 90 tablet 1  . aspirin 81 MG tablet Take 81 mg by mouth every morning.     . Calcium Citrate-Vitamin D (CALCIUM + D PO) Take 1 tablet by mouth every morning.    . chlorpheniramine (CHLOR-TRIMETON) 4 MG tablet Take 2 tabs  by mouth at bedtime and every 4 hours as needed for drainage/drippy nose/sneezing    . famotidine (PEPCID) 20 MG tablet Take 1 tablet (20 mg total) by mouth at bedtime. 30 tablet 11  . fluocinonide ointment (LIDEX) 4.40 % Apply 1 application topically as needed.     . fluticasone (FLONASE) 50 MCG/ACT nasal spray USE 1 SPRAY IN EACH NOSTRIL TWICE DAILY 48 g 1  . Fluticasone-Umeclidin-Vilant (TRELEGY ELLIPTA) 100-62.5-25 MCG/INH AEPB Inhale 1 puff into the lungs daily. 1 each 0  . glucose blood (ONE TOUCH ULTRA TEST) test strip 1 each by Other route daily. Use 1 strips to check blood  sugar twice a day Ex E11.9 100 each 3  . glyBURIDE (DIABETA) 2.5 MG tablet TAKE 1 TABLET EVERY DAY WITH BREAKFAST 90 tablet 1  . ibuprofen (ADVIL,MOTRIN) 600 MG tablet Take 1 tablet (600 mg total) by mouth every 6 (six) hours as needed. 30 tablet 0  . Lancets (ONETOUCH ULTRASOFT) lancets PRN 100 each 3  . losartan-hydrochlorothiazide (HYZAAR) 100-12.5 MG tablet Take 1 tablet by mouth daily. 90 tablet 0  . lovastatin (MEVACOR) 20 MG tablet TAKE 1 TABLET AT BEDTIME 90 tablet 1  . mometasone (ELOCON) 0.1 % ointment     . montelukast (SINGULAIR) 10 MG tablet Take 1 tablet (10 mg total) by mouth at bedtime. 30 tablet 3  . Multiple Vitamins-Minerals (CENTRUM SILVER PO) Take 1 tablet by mouth every morning.    . nortriptyline (PAMELOR) 10 MG capsule Take 1-2 capsules (10-20 mg total) by mouth at bedtime. 60 capsule 1  . omeprazole (PRILOSEC) 40 MG capsule TAKE 1 CAPSULE EVERY DAY 90 capsule 1  . pantoprazole (PROTONIX) 40 MG tablet TAKE 1 TABLET TWICE DAILY BEFORE MEALS 180 tablet 3  . Potassium Chloride ER 20 MEQ TBCR TAKE 1 TABLET EVERY DAY 90 tablet 3  . Respiratory Therapy Supplies (FLUTTER) DEVI Use as directed 1 each 0  . sertraline (ZOLOFT) 100 MG tablet TAKE 1 TABLET EVERY MORNING 90 tablet 1  . Vitamin D, Ergocalciferol, (DRISDOL) 50000 units CAPS capsule Take 1 capsule (50,000 Units total) by mouth every 7 (seven) days. 12 capsule 0   No current facility-administered medications on file prior to visit.     Past Medical History:  Diagnosis Date  . ALLERGIC RHINITIS   . Allergy   . Anxiety state, unspecified   . Arthritis   . BRONCHITIS, CHRONIC   . DEPRESSION   . DIABETES MELLITUS, TYPE II   . GERD   . HYPERLIPIDEMIA   . HYPERTENSION   . Psoriasis     Past Surgical History:  Procedure Laterality Date  . ABDOMINAL HYSTERECTOMY  1980  . TRIGGER FINGER RELEASE     Index finger    Social History   Socioeconomic History  . Marital status: Married    Spouse name: None  .  Number of children: 0  . Years of education: 62yr colge  . Highest education level: None  Social Needs  . Financial resource strain: None  . Food insecurity - worry: None  . Food insecurity - inability: None  . Transportation needs - medical: None  . Transportation needs - non-medical: None  Occupational History  . Occupation: Retired   Tobacco Use  . Smoking status: Never Smoker  . Smokeless tobacco: Never Used  Substance and Sexual Activity  . Alcohol use: No    Alcohol/week: 0.0 oz  . Drug use: No  . Sexual activity: No  Other Topics Concern  .  None  Social History Narrative   Married, lives with spouse-retired from [employer] insurance   Not employed    Drinks coffee occasional, Consumes 1 soda a day     Family History  Problem Relation Age of Onset  . Stroke Mother   . Angina Mother   . Diabetes Father   . Hyperlipidemia Other        Parent  . Hypertension Other        Parent  . Diabetes Sister        x1  . CAD Sister   . Diabetes Brother        x 2  . Colon cancer Neg Hx   . Esophageal cancer Neg Hx   . Rectal cancer Neg Hx   . Stomach cancer Neg Hx   . Pancreatic cancer Neg Hx     Review of Systems  Constitutional: Negative for chills and fever.  Eyes: Negative for visual disturbance.  Gastrointestinal: Negative for nausea.  Neurological: Positive for dizziness (more chronic) and headaches. Negative for light-headedness.  Psychiatric/Behavioral: Negative for confusion.       Objective:   Vitals:   11/21/17 1528  BP: 124/60  Pulse: 90  Resp: 16  Temp: 98.9 F (37.2 C)  SpO2: 98%   Wt Readings from Last 3 Encounters:  11/21/17 164 lb (74.4 kg)  11/14/17 163 lb (73.9 kg)  09/29/17 168 lb (76.2 kg)   Body mass index is 32.03 kg/m.   Physical Exam  Constitutional: She is oriented to person, place, and time. She appears well-developed and well-nourished. No distress.  HENT:  Head: Normocephalic and atraumatic.  No injury visualized, mild  tenderness right posterior head  Eyes: Conjunctivae and EOM are normal.  Neck: Normal range of motion. Neck supple.  Musculoskeletal:  No posterior neck pain  Neurological: She is alert and oriented to person, place, and time. No cranial nerve deficit. She exhibits normal muscle tone. Coordination normal.  Normal sensation and strength in all extremities  Skin: Skin is warm and dry. She is not diaphoretic. No erythema.           Assessment & Plan:    See Problem List for Assessment and Plan of chronic medical problems.

## 2017-11-21 NOTE — Patient Instructions (Signed)
Take tylenol or advil for your headaches if needed.   Call if your symptoms worsen or do not improve. Call with any questions.    Head Injury, Adult There are many types of head injuries. Head injuries can be as minor as a bump, or they can be more severe. More severe head injuries include:  A jarring injury to the brain (concussion).  A bruise of the brain (contusion). This means there is bleeding in the brain that can cause swelling.  A cracked skull (skull fracture).  Bleeding in the brain that collects, clots, and forms a bump (hematoma).  After a head injury, you may need to be observed for a while in the emergency department or urgent care. Sometimes admission to the hospital is needed. After a head injury has happened, most problems occur within the first 24 hours, but side effects may occur up to 7-10 days after the injury. It is important to watch your condition for any changes. What are the causes? There are many possible causes of a head injury. A serious head injury may happen to someone who is in a car accident (motor vehicle collision). Other causes of major head injuries include bicycle or motorcycle accidents, sports injuries, and falls. Risk factors This condition is more likely to occur in people who:  Drink a lot of alcohol or use drugs.  Are over the age of 58.  Are at risk for falls.  What are the symptoms? There are many possible symptoms of a head injury. Visible symptoms of a head injury include a bruise, bump, or bleeding at the site of the injury. Other non-visible symptoms include:  Feeling sleepy or not being able to stay awake.  Passing out.  Headache.  Seizures.  Dizziness.  Confusion.  Memory problems.  Nausea or vomiting.  Other possible symptoms that may develop after the head injury include:  Poor attention and concentration.  Fatigue or tiring easily.  Irritability.  Being uncomfortable around bright lights or loud  noises.  Anxiety or depression.  Disturbed sleep.  How is this diagnosed? This condition can usually be diagnosed based on your symptoms, a description of the injury, and a physical exam. You may also have imaging tests done, such as a CT scan or MRI. You will also be closely watched. How is this treated? Treatment for this condition depends on the severity and type of injury you have. The main goal of treatment is to prevent complications and allow the brain time to heal. For mild head injury, you may be sent home and treatment may include:  Observation. A responsible adult should stay with you for 24 hours after your injury and check on you often.  Physical rest.  Brain rest.  Pain medicines.  For severe brain injury, treatment may include:  Close observation. This includes hospitalization with frequent physical exams. You may need to go to a hospital that specializes in head injury.  Pain medicines.  Breathing support. This may include using a ventilator.  Managing the pressure inside the brain (intracranial pressure, or ICP). This may include: ? Monitoring the ICP. ? Giving medicines to decrease the ICP. ? Positioning you to decrease the ICP.  Medicine to prevent seizures.  Surgery to stop bleeding or to remove blood clots (craniotomy).  Surgery to remove part of the skull (decompressive craniectomy). This allows room for the brain to swell.  Follow these instructions at home: Activity  Rest as much as possible and avoid activities that are physically hard  or tiring.  Make sure you get enough sleep.  Limit activities that require a lot of thought or attention, such as: ? Watching TV. ? Playing memory games and puzzles. ? Job-related work or homework. ? Working on [employer], and texting.  Avoid activities that could cause another head injury, such as playing sports, until your health care provider approves. Having another head injury, especially  before the first one has healed, can be dangerous.  Ask your health care provider when it is safe for you to return to your regular activities, including work or school. Ask your health care provider for a step-by-step plan for gradually returning to activities.  Ask your health care provider when you can drive, ride a bicycle, or use heavy machinery. Your ability to react may be slower after a brain injury. Never do these activities if you are dizzy.  Lifestyle  Do not drink alcohol until your health care provider approves, and avoid drug use. Alcohol and certain drugs may slow your recovery and can put you at risk of further injury.  If it is harder than usual to remember things, write them down.  If you are easily distracted, try to do one thing at a time.  Talk with family members or close friends when making important decisions.  Tell your friends, family, a trusted colleague, and work Freight forward[employer] about your injury, symptoms, and restrictions. Have them watch for any new or worsening problems.  General instructions  Take over-the-counter and prescription medicines only as told by your health care provider.  Have someone stay with you for 24 hours after your head injury. This person should watch you for any changes in your symptoms and be ready to seek medical help, as needed.  Keep all follow-up visits as told by your health care provider. This is important.  Prevention  Work on improving your balance and strength to avoid falls.  Wear a seatbelt when you are in a moving vehicle.  Wear a helmet when riding a bicycle, skiing, or doing any other sport or activity that has a risk of injury.  Drink alcohol only in moderation.  Take safety measures in your home, such as: ? Removing clutter and tripping hazards from floors and stairways. ? Using grab bars in bathrooms and handrails by stairs. ? Placing non-slip mats on floors and in bathtubs. ? Improving lighting in dim areas. Get  help right away if:  You have: ? A severe headache that is not helped by medicine. ? Trouble walking, have weakness in your arms and legs, or lose your balance. ? Clear or bloody fluid coming from your nose or ears. ? Changes in your vision. ? A seizure.  You vomit.  Your symptoms get worse.  Your speech is slurred.  You pass out.  You are sleepier and have trouble staying awake.  Your pupils change size. These symptoms may represent a serious problem that is an emergency. Do not wait to see if the symptoms will go away. Get medical help right away. Call your local emergency services (911 in the [place] Do not drive yourself to the hospital. This information is not intended to replace advice given to you by your health care provider. Make sure you discuss any questions you have with your health care provider. Document Released: 10/14/2005 Document Revised: 05/10/2016 Document Reviewed: 04/23/2016 Elsevier Interactive Patient Education  2017 [employer].

## 2017-11-27 ENCOUNTER — Encounter (INDEPENDENT_AMBULATORY_CARE_PROVIDER_SITE_OTHER): Payer: Medicare Other

## 2017-12-03 DIAGNOSIS — Z1231 Encounter for screening mammogram for malignant neoplasm of breast: Secondary | ICD-10-CM | POA: Diagnosis not present

## 2017-12-05 ENCOUNTER — Telehealth: Payer: Self-pay | Admitting: Family Medicine

## 2017-12-05 NOTE — Telephone Encounter (Signed)
Copied from [place]. Topic: Quick Communication - See Telephone Encounter >> Dec 05, 2017  1:21 PM Ether Griffins B wrote: CRM for notification. See Telephone encounter for:  Pt needing Vitamin D, Ergocalciferol resent to [id number] [place], [place] 12/05/17.

## 2017-12-08 ENCOUNTER — Other Ambulatory Visit: Payer: Self-pay

## 2017-12-08 MED ORDER — VITAMIN D (ERGOCALCIFEROL) 1.25 MG (50000 UNIT) PO CAPS: 50000.0000 [IU] | ORAL_CAPSULE | ORAL | 0 refills | Status: DC

## 2017-12-09 ENCOUNTER — Ambulatory Visit (INDEPENDENT_AMBULATORY_CARE_PROVIDER_SITE_OTHER): Payer: Self-pay | Admitting: Family Medicine

## 2017-12-21 IMAGING — DX DG CHEST 2V
2 series · 2 of 2 positions shown · non-contrast
Comparison: 12/15/2015

CLINICAL DATA: Dry cough for 3 days.

EXAM:
CHEST  2 VIEW

[chest pa]
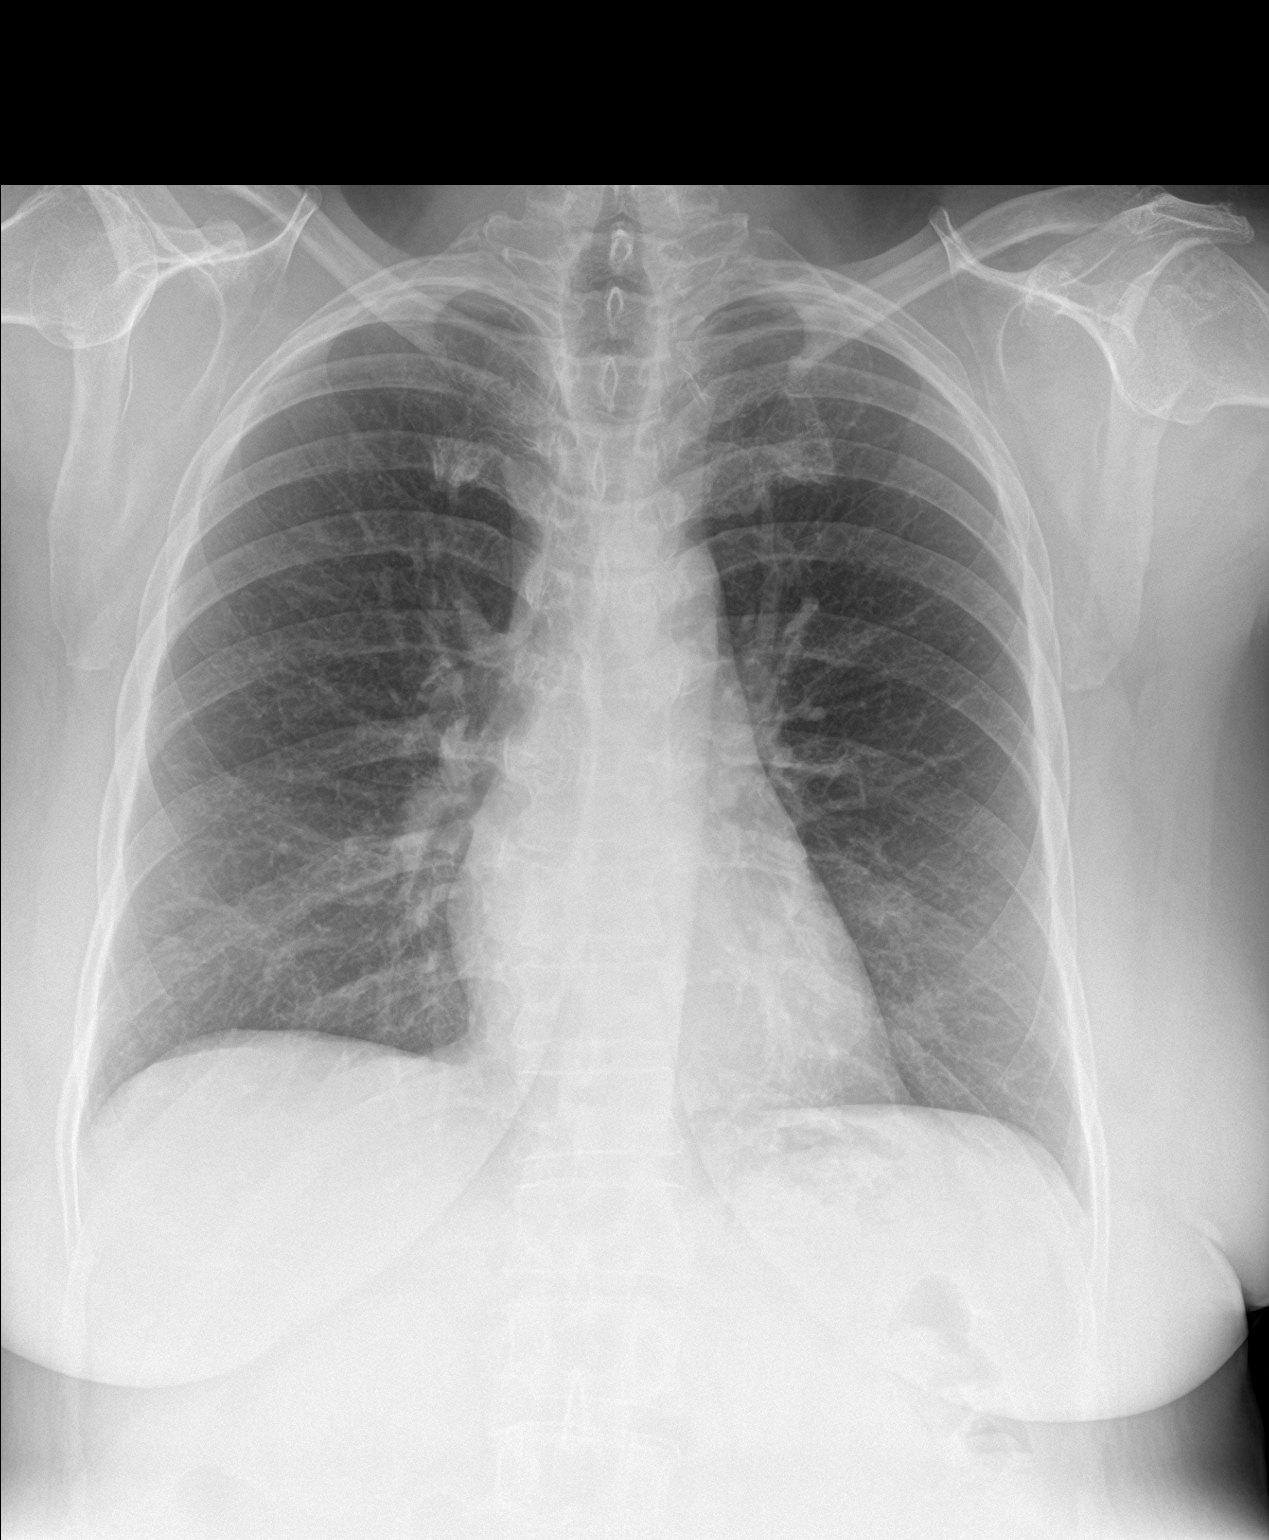

[chest lat]
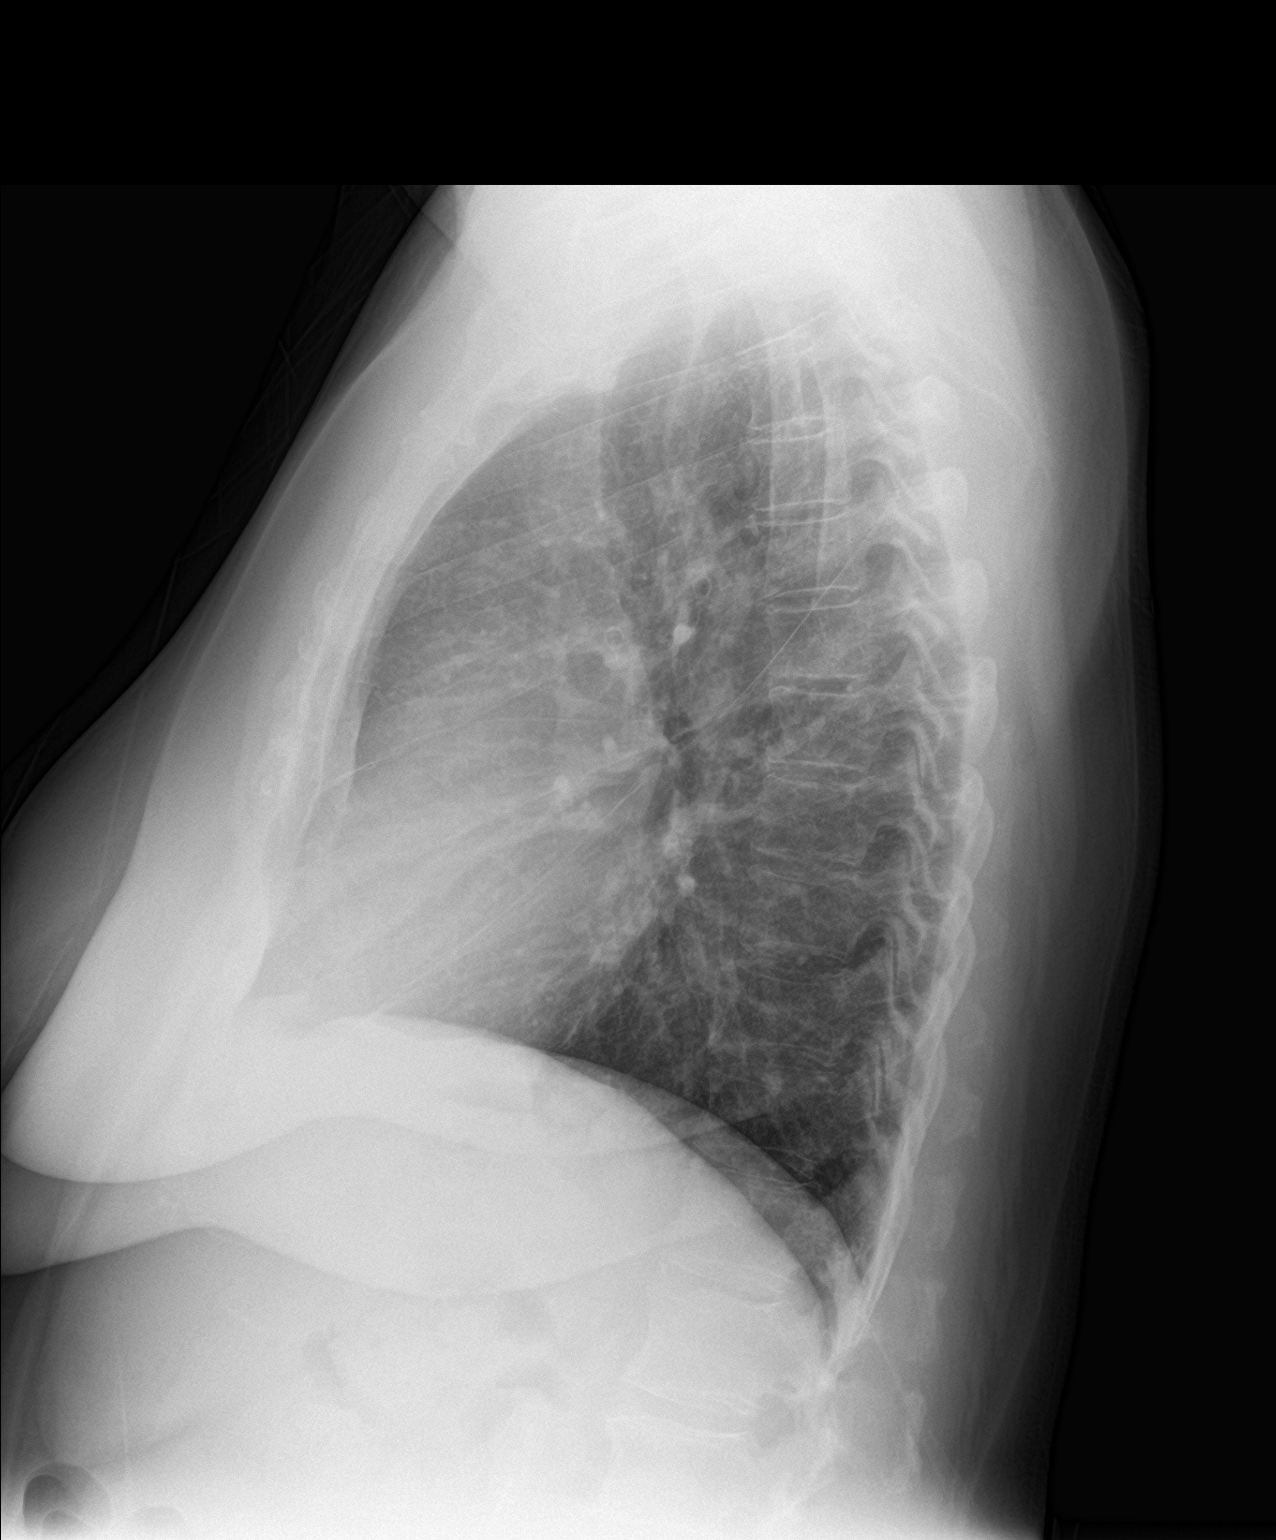

[2 of 2 positions shown; findings below may reference images not displayed]

FINDINGS: Heart and mediastinal contours are within normal limits. No focal
opacities or effusions. No acute bony abnormality.
IMPRESSION: No active cardiopulmonary disease.

## 2018-01-05 NOTE — Progress Notes (Signed)
[facility] [place], [place] [address] Phone: [phone] Subjective:     CC: Right ankle pain  BJY:[id number]  Heidi Franklin is a 72 y.o. female coming in with complaint of right ankle pain. New problem. Arthritic changes in multiple other joints. All of pain is on lateral malleolus. She has been having pain for 3 weeks. Cannot recall mechanism of injury. Dull pain occurs with weight bearing. No recent change in shoes.     Past Medical History:  Diagnosis Date  . ALLERGIC RHINITIS   . Allergy   . Anxiety state, unspecified   . Arthritis   . BRONCHITIS, CHRONIC   . DEPRESSION   . DIABETES MELLITUS, TYPE II   . GERD   . HYPERLIPIDEMIA   . HYPERTENSION   . Psoriasis    Past Surgical History:  Procedure Laterality Date  . ABDOMINAL HYSTERECTOMY  1980  . TRIGGER FINGER RELEASE     Index finger   Social History   Socioeconomic History  . Marital status: Married    Spouse name: None  . Number of children: 0  . Years of education: 54yr colge  . Highest education level: None  Social Needs  . Financial resource strain: None  . Food insecurity - worry: None  . Food insecurity - inability: None  . Transportation needs - medical: None  . Transportation needs - non-medical: None  Occupational History  . Occupation: Retired   Tobacco Use  . Smoking status: Never Smoker  . Smokeless tobacco: Never Used  Substance and Sexual Activity  . Alcohol use: No    Alcohol/week: 0.0 oz  . Drug use: No  . Sexual activity: No  Other Topics Concern  . 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 Metformin And Related Diarrhea  . Penicillins Rash   Family History  Problem Relation Age of Onset  . Stroke Mother   . Angina Mother   . Diabetes Father   . Hyperlipidemia Other        Parent    . Hypertension Other        Parent  . Diabetes Sister        x1  . CAD Sister   . Diabetes Brother        x 2  . Colon cancer Neg Hx   . Esophageal cancer Neg Hx   . Rectal cancer Neg Hx   . Stomach cancer Neg Hx   . Pancreatic cancer Neg Hx      Past medical history, social, surgical and family history all reviewed in electronic medical record.  No pertanent information unless stated regarding to the chief complaint.   Review of Systems:Review of systems updated and as accurate as of 01/06/18  No headache, visual changes, nausea, vomiting, diarrhea, constipation, dizziness, abdominal pain, skin rash, fevers, chills, night sweats, weight loss, swollen lymph nodes, body aches, , chest pain, shortness of breath, mood changes.  Positive muscle aches and joint swelling  Objective  Blood pressure 110/68, pulse 86, height 5' (1.524 m), weight 165 lb (74.8 kg), SpO2 98 %. Systems examined below as of 01/06/18   General: No apparent distress alert and oriented x3 mood and affect normal, dressed appropriately.  HEENT: Pupils equal, extraocular movements intact  Respiratory: Patient's speak in full sentences  and does not appear short of breath  Cardiovascular: No lower extremity edema, non tender, no erythema  Skin: Warm dry intact with no signs of infection or rash on extremities or on axial skeleton.  Abdomen: Soft nontender  Neuro: Cranial nerves II through XII are intact, neurovascularly intact in all extremities with 2+ DTRs and 2+ pulses.  Lymph: No lymphadenopathy of posterior or anterior cervical chain or axillae bilaterally.  Gait mild antalgic MSK:  Non tender with full range of motion and good stability and symmetric strength and tone of shoulders, elbows, wrist, hip, knee bilaterally.  Arthritic changes of multiple joints including knees Ankle: Right No visible erythema or swelling. Range of motion is full in all directions. Strength is 5/5 in all directions. Stable lateral  and medial ligaments; squeeze test and kleiger test unremarkable; Talar dome nontender; No pain at base of 5th MT; No tenderness over cuboid; No tenderness over N spot or navicular prominence No tenderness on posterior aspects of lateral and medial malleolus Pain Able to walk 4 steps. Contralateral ankle unremarkable  [id number]; 15 additional minutes spent for Therapeutic exercises as stated in above notes.  This included exercises focusing on stretching, strengthening, with significant focus on eccentric aspects.   Long term goals include an improvement in range of motion, strength, endurance as well as avoiding reinjury. Patient's frequency would include in 1-2 times a day, 3-5 times a week for a duration of 6-12 weeks. Ankle strengthening that included:  Basic range of motion exercises to allow proper full motion at ankle Stretching of the lower leg and hamstrings  Theraband exercises for the lower leg - inversion, eversion, dorsiflexion and plantarflexion each to be completed with a theraband Balance exercises to increase proprioception Weight bearing exercises to increase strength and balance  Proper technique shown and discussed handout in great detail with ATC.  All questions were discussed and answered.      Impression and Recommendations:     This case required medical decision making of moderate complexity.      Note: This dictation was prepared with Dragon dictation along with smaller phrase technology. Any transcriptional errors that result from this process are unintentional.

## 2018-01-06 ENCOUNTER — Ambulatory Visit: Payer: Self-pay

## 2018-01-06 ENCOUNTER — Ambulatory Visit (INDEPENDENT_AMBULATORY_CARE_PROVIDER_SITE_OTHER): Payer: Medicare Other | Admitting: Family Medicine

## 2018-01-06 ENCOUNTER — Encounter: Payer: Self-pay | Admitting: Family Medicine

## 2018-01-06 VITALS — BP 110/68 | HR 86 | Ht 60.0 in | Wt 165.0 lb

## 2018-01-06 DIAGNOSIS — M25571 Pain in right ankle and joints of right foot: Secondary | ICD-10-CM

## 2018-01-06 DIAGNOSIS — M7671 Peroneal tendinitis, right leg: Secondary | ICD-10-CM

## 2018-01-06 MED ORDER — MELOXICAM 7.5 MG PO TABS: 7.5000 mg | ORAL_TABLET | Freq: Every day | ORAL | 0 refills | Status: DC

## 2018-01-06 NOTE — Assessment & Plan Note (Signed)
Peroneal tendinitis.  Home exercises given, discussed icing regimen and home exercises.  Discussed the possibility of injections if needed.  Follow-up again in 4 weeks

## 2018-01-06 NOTE — Patient Instructions (Addendum)
Good to see you  Peroneal tendonitis.  Ice is your friend.  Stay active.  Exercises 3 times a week.  Good shoes are important  See me again in 3 weeks and if not better we will consider injection.  Try meloxicam daily and see if that helps with pain (it is like a strong ibuprofen that is 1 time a day )

## 2018-01-15 ENCOUNTER — Other Ambulatory Visit: Payer: Self-pay

## 2018-01-15 ENCOUNTER — Emergency Department (INDEPENDENT_AMBULATORY_CARE_PROVIDER_SITE_OTHER)
Admission: EM | Admit: 2018-01-15 | Discharge: 2018-01-15 | Disposition: A | Discharge status: Home / Self Care | Payer: Medicare Other | Source: Home / Self Care | Attending: Family Medicine | Admitting: Family Medicine

## 2018-01-15 DIAGNOSIS — J3489 Other specified disorders of nose and nasal sinuses: Secondary | ICD-10-CM

## 2018-01-15 DIAGNOSIS — B001 Herpesviral vesicular dermatitis: Secondary | ICD-10-CM

## 2018-01-15 MED ORDER — VALACYCLOVIR HCL 1 G PO TABS: ORAL_TABLET | ORAL | 0 refills | Status: DC

## 2018-01-15 MED ORDER — OSELTAMIVIR PHOSPHATE 75 MG PO CAPS: 75.0000 mg | ORAL_CAPSULE | Freq: Two times a day (BID) | ORAL | 0 refills | Status: DC

## 2018-01-15 MED ORDER — BENZONATATE 200 MG PO CAPS: ORAL_CAPSULE | ORAL | 0 refills | Status: DC

## 2018-01-15 MED ORDER — DOXYCYCLINE HYCLATE 100 MG PO CAPS: 100.0000 mg | ORAL_CAPSULE | Freq: Two times a day (BID) | ORAL | 0 refills | Status: DC

## 2018-01-15 NOTE — Discharge Instructions (Addendum)
May apply ice to the sores to decrease swelling: Put ice in a plastic bag. Place a towel between your skin and the bag. Leave the ice on for 10 minutes, 2-3 times per day.

## 2018-01-15 NOTE — ED Triage Notes (Signed)
Started last week with a cold sore on the bottom lip, now has a cold sore inside the upper lip, and upper lip keeps swelling.

## 2018-01-15 NOTE — ED Provider Notes (Signed)
[facility]    CSN: [id number] Arrival date & time: 01/15/18  1012     History   Chief Complaint Chief Complaint  Patient presents with  . Oral Swelling    HPI Heidi Franklin is a 72 y.o. female.   Patient developed a cold sore on her lower lip one week ago, followed by pain and swelling in her left upper lip that has persisted.  She also complains of soreness in her left nares for about 3 to 4 days.  No fevers, chills, and sweats.  She feels well otherwise.  The history is provided by the patient.    Past Medical History:  Diagnosis Date  . ALLERGIC RHINITIS   . Allergy   . Anxiety state, unspecified   . Arthritis   . BRONCHITIS, CHRONIC   . DEPRESSION   . DIABETES MELLITUS, TYPE II   . GERD   . HYPERLIPIDEMIA   . HYPERTENSION   . Psoriasis     Patient Active Problem List   Diagnosis Date Noted  . Peroneal tendinitis, right 01/06/2018  . Head injury 11/21/2017  . Psoriasis 11/14/2017  . Muscle tightness 11/14/2017  . Muscle twitching 11/14/2017  . Acute bursitis of right shoulder 09/29/2017  . Bilateral hip pain 07/10/2017  . Bronchiectasis without complication ([place]) 57/32/2025  . Irritable larynx syndrome 04/25/2017  . Benign lipomatous neoplasm of skin and subcutaneous tissue of right leg 02/25/2017  . Anemia 02/05/2017  . Fatigue 01/23/2017  . Chronic cough 01/02/2017  . Multiple pulmonary nodules 11/10/2016  . Acute midline low back pain with right-sided sciatica 10/09/2016  . Moderate right ankle sprain 09/17/2016  . Fall 08/08/2016  . Neck pain 07/29/2016  . Degenerative arthritis of right knee 06/20/2016  . Foot pain, left 05/28/2016  . Degenerative arthritis of left knee 04/10/2016  . Peroneal tendinitis of left lower extremity 09/05/2015  . Recurrent falls 08/08/2015  . Bursitis of left shoulder 07/13/2015  . Morbid obesity due to excess calories ([place]) 04/16/2015  . Nonspecific abnormal electrocardiogram (ECG) (EKG) 05/11/2011  .  Tenosynovitis of finger 04/22/2011  . Type 2 diabetes, uncontrolled, with neuropathy ([place]) 03/02/2010  . Hyperlipidemia associated with type 2 diabetes mellitus ([place]) 03/02/2010  . Depression with anxiety 03/02/2010  . Essential hypertension 03/02/2010  . Chronic rhinitis 03/02/2010  . Upper airway cough syndrome 03/02/2010  . GERD 03/02/2010    Past Surgical History:  Procedure Laterality Date  . ABDOMINAL HYSTERECTOMY  1980  . TRIGGER FINGER RELEASE     Index finger    OB History   None      Home Medications    Prior to Admission medications   Medication Sig Start Date End Date Taking? Authorizing Provider  acetaminophen (TYLENOL) 325 MG tablet Take 650 mg by mouth every 6 (six) hours as needed.    [provider]  albuterol (VENTOLIN HFA) 108 (90 Base) MCG/ACT inhaler Inhale 1-2 puffs into the lungs every 6 (six) hours as needed for wheezing or shortness of breath. 03/18/17   Nche, Charlene Brooke, NP  ALPRAZolam Duanne Moron) 0.5 MG tablet take 1 tablet by mouth every 8 hours if needed for anxiety 06/20/17   Binnie Rail, MD  amLODipine (NORVASC) 10 MG tablet TAKE 1 TABLET EVERY DAY 09/24/17   Binnie Rail, MD  aspirin 81 MG tablet Take 81 mg by mouth every morning.     [provider]  Calcium Citrate-Vitamin D (CALCIUM + D PO) Take 1 tablet by mouth every  morning.    [provider]  chlorpheniramine (CHLOR-TRIMETON) 4 MG tablet Take 2 tabs by mouth at bedtime and every 4 hours as needed for drainage/drippy nose/sneezing    [provider]  doxycycline (VIBRAMYCIN) 100 MG capsule Take 1 capsule (100 mg total) by mouth 2 (two) times daily. Take with food. 01/15/18   Kandra Nicolas, MD  famotidine (PEPCID) 20 MG tablet Take 1 tablet (20 mg total) by mouth at bedtime. 06/15/15   Rowe Clack, MD  fluocinonide ointment (LIDEX) 2.83 % Apply 1 application topically as needed.  06/11/16   [provider]  fluticasone (FLONASE) 50  MCG/ACT nasal spray USE 1 SPRAY IN EACH NOSTRIL TWICE DAILY 09/24/17   Burns, Claudina Lick, MD  Fluticasone-Umeclidin-Vilant (TRELEGY ELLIPTA) 100-62.5-25 MCG/INH AEPB Inhale 1 puff into the lungs daily. 03/18/17   Nche, Charlene Brooke, NP  glucose blood (ONE TOUCH ULTRA TEST) test strip 1 each by Other route daily. Use 1 strips to check blood sugar twice a day Ex E11.9 01/01/16   Burns, Claudina Lick, MD  glyBURIDE (DIABETA) 2.5 MG tablet TAKE 1 TABLET EVERY DAY WITH BREAKFAST 09/24/17   Binnie Rail, MD  ibuprofen (ADVIL,MOTRIN) 600 MG tablet Take 1 tablet (600 mg total) by mouth every 6 (six) hours as needed. 09/09/16   Ward, Ozella Almond, PA-C  Lancets (ONETOUCH ULTRASOFT) lancets PRN 01/01/16   Binnie Rail, MD  losartan-hydrochlorothiazide (HYZAAR) 100-12.5 MG tablet Take 1 tablet by mouth daily. 06/20/17   Binnie Rail, MD  lovastatin (MEVACOR) 20 MG tablet TAKE 1 TABLET AT BEDTIME 09/24/17   Burns, Claudina Lick, MD  meloxicam (MOBIC) 7.5 MG tablet Take 1 tablet (7.5 mg total) by mouth daily. 01/06/18   Lyndal Pulley, DO  mometasone (ELOCON) 0.1 % ointment  05/16/16   [provider]  montelukast (SINGULAIR) 10 MG tablet Take 1 tablet (10 mg total) by mouth at bedtime. 03/18/17   Nche, Charlene Brooke, NP  Multiple Vitamins-Minerals (CENTRUM SILVER PO) Take 1 tablet by mouth every morning.    [provider]  nortriptyline (PAMELOR) 10 MG capsule Take 1-2 capsules (10-20 mg total) by mouth at bedtime. 07/10/17   Lyndal Pulley, DO  omeprazole (PRILOSEC) 40 MG capsule TAKE 1 CAPSULE EVERY DAY 09/24/17   Binnie Rail, MD  pantoprazole (PROTONIX) 40 MG tablet TAKE 1 TABLET TWICE DAILY BEFORE MEALS 08/08/17   Binnie Rail, MD  Potassium Chloride ER 20 MEQ TBCR TAKE 1 TABLET EVERY DAY 05/07/17   Binnie Rail, MD  Respiratory Therapy Supplies (FLUTTER) DEVI Use as directed 08/03/15   Tanda Rockers, MD  sertraline (ZOLOFT) 100 MG tablet TAKE 1 TABLET EVERY MORNING 09/24/17   Burns, Claudina Lick,  MD  valACYclovir (VALTREX) 1000 MG tablet Take 2 tabs by mouth every 12 hours for one day. 01/15/18   Kandra Nicolas, MD  Vitamin D, Ergocalciferol, (DRISDOL) 50000 units CAPS capsule Take 1 capsule (50,000 Units total) by mouth every 7 (seven) days. 12/08/17   Lyndal Pulley, DO    Family History Family History  Problem Relation Age of Onset  . Stroke Mother   . Angina Mother   . Diabetes Father   . Hyperlipidemia Other        Parent  . Hypertension Other        Parent  . Diabetes Sister        x1  . CAD Sister   . Diabetes Brother  x 2  . Colon cancer Neg Hx   . Esophageal cancer Neg Hx   . Rectal cancer Neg Hx   . Stomach cancer Neg Hx   . Pancreatic cancer Neg Hx     Social History Social History   Tobacco Use  . Smoking status: Never Smoker  . Smokeless tobacco: Never Used  Substance Use Topics  . Alcohol use: No    Alcohol/week: 0.0 oz  . Drug use: No     Allergies   Metformin and related and Penicillins   Review of Systems Review of Systems No sore throat + swollen left upper lip + sore left nose No cough No pleuritic pain No wheezing No nasal congestion No post-nasal drainage No sinus pain/pressure No itchy/red eyes No earache No hemoptysis No SOB No fever/chills No nausea No vomiting No abdominal pain No diarrhea No urinary symptoms No skin rash No fatigue No myalgias No headache    Physical Exam Triage Vital Signs ED Triage Vitals  Enc Vitals Group     BP 01/15/18 1126 123/72     Pulse Rate 01/15/18 1126 87     Resp --      Temp 01/15/18 1126 98.4 F (36.9 C)     Temp Source 01/15/18 1126 Oral     SpO2 01/15/18 1126 99 %     Weight 01/15/18 1127 164 lb (74.4 kg)     Height 01/15/18 1127 5' (1.524 m)     Head Circumference --      Peak Flow --      Pain Score 01/15/18 1127 0     Pain Loc --      Pain Edu? --      Excl. in [place]? --    No data found.  Updated Vital Signs BP 123/72   Pulse 87   Temp 98.4 F  (36.9 C) (Oral)   Ht 5' (1.524 m)   Wt 164 lb (74.4 kg)   SpO2 99%   BMI 32.03 kg/m   Visual Acuity Right Eye Distance:   Left Eye Distance:   Bilateral Distance:    Right Eye Near:   Left Eye Near:    Bilateral Near:     Physical Exam  Constitutional: She appears well-nourished. No distress.  HENT:  Head: Normocephalic.  Right Ear: Tympanic membrane, external ear and ear canal normal.  Left Ear: Tympanic membrane, external ear and ear canal normal.  Mouth/Throat: Oropharynx is clear and moist and mucous membranes are normal.    Left upper lip is mildly swollen and tender to palpation.  No lesions in mouth.  Left mid nose mildly tender to palpation but no erythema or swelling.  Eyes: Pupils are equal, round, and reactive to light. Conjunctivae and EOM are normal.  Neck: Neck supple.  Cardiovascular: Normal rate.  Pulmonary/Chest: Effort normal.  Lymphadenopathy:    She has no cervical adenopathy.  Neurological: She is alert.  Skin: Skin is warm and dry.     UC Treatments / Results  Labs (all labs ordered are listed, but only abnormal results are displayed) Labs Reviewed - No data to display  EKG  EKG Interpretation None       Radiology No results found.  Procedures Procedures (including critical care time)  Medications Ordered in UC Medications - No data to display   Initial Impression / Assessment and Plan / UC Course  I have reviewed the triage vital signs and the nursing notes.  Pertinent labs & imaging results  that were available during my care of the patient were reviewed by me and considered in my medical decision making (see chart for details).    Begin Valtrex 2gm Q12hr for one day. Begin doxycycline for staph coverage.  May apply ice to the sores to decrease swelling: ? Put ice in a plastic bag. ? Place a towel between your skin and the bag. ? Leave the ice on for 10 minutes, 2-3 times per day.    Final Clinical Impressions(s) / UC  Diagnoses   Final diagnoses:  Recurrent cold sores  Nasal vestibulitis    ED Discharge Orders        Ordered    valACYclovir (VALTREX) 1000 MG tablet     01/15/18 1132    doxycycline (VIBRAMYCIN) 100 MG capsule  2 times daily     01/15/18 1132          Kandra Nicolas, MD 01/17/18 1725

## 2018-01-20 ENCOUNTER — Ambulatory Visit (INDEPENDENT_AMBULATORY_CARE_PROVIDER_SITE_OTHER): Payer: Medicare Other | Admitting: *Deleted

## 2018-01-20 VITALS — BP 148/66 | HR 87 | Resp 16 | Ht 60.0 in | Wt 164.0 lb

## 2018-01-20 DIAGNOSIS — Z0000 Encounter for general adult medical examination without abnormal findings: Secondary | ICD-10-CM | POA: Diagnosis not present

## 2018-01-20 NOTE — Progress Notes (Addendum)
Subjective:   Heidi Franklin is a 72 y.o. female who presents for Medicare Annual (Subsequent) preventive examination.  Review of Systems:  No ROS.  Medicare Wellness Visit. Additional risk factors are reflected in the social history.  Cardiac Risk Factors include: advanced age (>52men, >78 women);diabetes mellitus;dyslipidemia;hypertension Sleep patterns: gets up 1-2 times nightly to void and sleeps 5-6 hours nightly. Has restless sleep. Patient reports insomnia issues, discussed recommended sleep tips and stress reduction tips, education was attached to patient's AVS.  Home Safety/Smoke Alarms: Feels safe in home. Smoke alarms in place.  Living environment; residence and Firearm Safety: 1-story house/ trailer, no firearms. Lives with husband, no needs for DME, good support system Seat Belt Safety/Bike Helmet: Wears seat belt.     Objective:     Vitals: BP (!) 148/66   Pulse 87   Resp 16   Ht 5' (1.524 m)   Wt 164 lb (74.4 kg)   SpO2 100%   BMI 32.03 kg/m   Body mass index is 32.03 kg/m.  Advanced Directives 01/20/2018 01/14/2017 09/09/2016 08/07/2016 07/11/2016 12/15/2015 10/14/2015  Does Patient Have a Medical Advance Directive? No No No No No No No  Does patient want to make changes to medical advance directive? Yes (ED - Information included in AVS) Yes (ED - Information included in AVS) - - - - -  Would patient like information on creating a medical advance directive? - - No - patient declined information Yes - Educational materials given - - No - patient declined information    Tobacco Social History   Tobacco Use  Smoking Status Never Smoker  Smokeless Tobacco Never Used     Counseling given: Not Answered  Past Medical History:  Diagnosis Date  . ALLERGIC RHINITIS   . Allergy   . Anxiety state, unspecified   . Arthritis   . BRONCHITIS, CHRONIC   . DEPRESSION   . DIABETES MELLITUS, TYPE II   . GERD   . HYPERLIPIDEMIA   . HYPERTENSION   . Psoriasis    Past  Surgical History:  Procedure Laterality Date  . ABDOMINAL HYSTERECTOMY  1980  . TRIGGER FINGER RELEASE     Index finger   Family History  Problem Relation Age of Onset  . Stroke Mother   . Angina Mother   . Diabetes Father   . Hyperlipidemia Other        Parent  . Hypertension Other        Parent  . Diabetes Sister        x1  . CAD Sister   . Diabetes Brother        x 2  . Colon cancer Neg Hx   . Esophageal cancer Neg Hx   . Rectal cancer Neg Hx   . Stomach cancer Neg Hx   . Pancreatic cancer Neg Hx    Social History   Socioeconomic History  . Marital status: Married    Spouse name: Not on file  . Number of children: 0  . Years of education: 2yr colge  . Highest education level: Not on file  Occupational History  . Occupation: Retired   Scientific laboratory technician  . Financial resource strain: Somewhat hard  . Food insecurity:    Worry: Sometimes true    Inability: Sometimes true  . Transportation needs:    Medical: No    Non-medical: No  Tobacco Use  . Smoking status: Never Smoker  . Smokeless tobacco: Never Used  Substance and Sexual Activity  .  Alcohol use: No    Alcohol/week: 0.0 oz  . Drug use: No  . Sexual activity: Not Currently  Lifestyle  . Physical activity:    Days per week: 0 days    Minutes per session: 0 min  . Stress: Very much  Relationships  . Social connections:    Talks on phone: More than three times a week    Gets together: More than three times a week    Attends religious service: More than 4 times per year    Active member of club or organization: Yes    Attends meetings of clubs or organizations: More than 4 times per year    Relationship status: Married  Other Topics Concern  . Not on file  Social History Narrative   Married, lives with spouse-retired from [employer] insurance   Not employed    Drinks coffee occasional, Consumes 1 soda a day     Outpatient Encounter Medications as of 01/20/2018  Medication Sig  . acetaminophen (TYLENOL)  325 MG tablet Take 650 mg by mouth every 6 (six) hours as needed.  Marland Kitchen albuterol (VENTOLIN HFA) 108 (90 Base) MCG/ACT inhaler Inhale 1-2 puffs into the lungs every 6 (six) hours as needed for wheezing or shortness of breath.  . ALPRAZolam (XANAX) 0.5 MG tablet take 1 tablet by mouth every 8 hours if needed for anxiety  . amLODipine (NORVASC) 10 MG tablet TAKE 1 TABLET EVERY DAY  . aspirin 81 MG tablet Take 81 mg by mouth every morning.   . Calcium Citrate-Vitamin D (CALCIUM + D PO) Take 1 tablet by mouth every morning.  . chlorpheniramine (CHLOR-TRIMETON) 4 MG tablet Take 2 tabs by mouth at bedtime and every 4 hours as needed for drainage/drippy nose/sneezing  . doxycycline (VIBRAMYCIN) 100 MG capsule Take 1 capsule (100 mg total) by mouth 2 (two) times daily. Take with food.  . famotidine (PEPCID) 20 MG tablet Take 1 tablet (20 mg total) by mouth at bedtime.  . fluocinonide ointment (LIDEX) 2.94 % Apply 1 application topically as needed.   . fluticasone (FLONASE) 50 MCG/ACT nasal spray USE 1 SPRAY IN EACH NOSTRIL TWICE DAILY  . glucose blood (ONE TOUCH ULTRA TEST) test strip 1 each by Other route daily. Use 1 strips to check blood sugar twice a day Ex E11.9  . glyBURIDE (DIABETA) 2.5 MG tablet TAKE 1 TABLET EVERY DAY WITH BREAKFAST  . ibuprofen (ADVIL,MOTRIN) 600 MG tablet Take 1 tablet (600 mg total) by mouth every 6 (six) hours as needed.  . Lancets (ONETOUCH ULTRASOFT) lancets PRN  . losartan-hydrochlorothiazide (HYZAAR) 100-12.5 MG tablet Take 1 tablet by mouth daily.  Marland Kitchen lovastatin (MEVACOR) 20 MG tablet TAKE 1 TABLET AT BEDTIME  . meloxicam (MOBIC) 7.5 MG tablet Take 1 tablet (7.5 mg total) by mouth daily.  . mometasone (ELOCON) 0.1 % ointment   . montelukast (SINGULAIR) 10 MG tablet Take 1 tablet (10 mg total) by mouth at bedtime.  . Multiple Vitamins-Minerals (CENTRUM SILVER PO) Take 1 tablet by mouth every morning.  Marland Kitchen omeprazole (PRILOSEC) 40 MG capsule Take 40 mg by mouth daily.  .  Potassium Chloride ER 20 MEQ TBCR TAKE 1 TABLET EVERY DAY  . Respiratory Therapy Supplies (FLUTTER) DEVI Use as directed  . sertraline (ZOLOFT) 100 MG tablet TAKE 1 TABLET EVERY MORNING  . valACYclovir (VALTREX) 1000 MG tablet Take 2 tabs by mouth every 12 hours for one day.  . Vitamin D, Ergocalciferol, (DRISDOL) 50000 units CAPS capsule Take 1 capsule (50,000  Units total) by mouth every 7 (seven) days.  . [DISCONTINUED] Fluticasone-Umeclidin-Vilant (TRELEGY ELLIPTA) 100-62.5-25 MCG/INH AEPB Inhale 1 puff into the lungs daily. (Patient not taking: Reported on 01/20/2018)  . [DISCONTINUED] nortriptyline (PAMELOR) 10 MG capsule Take 1-2 capsules (10-20 mg total) by mouth at bedtime. (Patient not taking: Reported on 01/20/2018)  . [DISCONTINUED] omeprazole (PRILOSEC) 40 MG capsule TAKE 1 CAPSULE EVERY DAY (Patient not taking: Reported on 01/20/2018)  . [DISCONTINUED] pantoprazole (PROTONIX) 40 MG tablet TAKE 1 TABLET TWICE DAILY BEFORE MEALS (Patient not taking: Reported on 01/20/2018)   No facility-administered encounter medications on file as of 01/20/2018.     Activities of Daily Living In your present state of health, do you have any difficulty performing the following activities: 01/20/2018  Hearing? N  Vision? N  Difficulty concentrating or making decisions? N  Walking or climbing stairs? N  Dressing or bathing? N  Doing errands, shopping? N  Preparing Food and eating ? N  Using the Toilet? N  In the past six months, have you accidently leaked urine? N  Do you have problems with loss of bowel control? N  Managing your Medications? N  Managing your Finances? N  Housekeeping or managing your Housekeeping? N  Some recent data might be hidden    Patient Care Team: Binnie Rail, MD as PCP - General (Internal Medicine) Chesley Mires, MD (Pulmonary Disease) Gean Birchwood, DPM (Podiatry) Almedia Balls, MD (Orthopedic Surgery) Irene Shipper, MD (Gastroenterology) Star Age, MD  (Neurology)    Assessment:   This is a routine wellness examination for Heidi Franklin. Physical assessment deferred to PCP.   Exercise Activities and Dietary recommendations Current Exercise Habits: The patient does not participate in regular exercise at present(chair exercies pamphlet provided), Exercise limited by: None identified  Diet (meal preparation, eat out, water intake, caffeinated beverages, dairy products, fruits and vegetables): in general, a "healthy" diet  , well balanced   Reviewed heart healthy and diabetic diet, encouraged patient to increase daily water intake. Relevant patient education assigned to patient using Emmi.  Goals    . Patient Stated     Take at least 30 minutes daily just for me to relax, meditate do what speaks to me.        Fall Risk Fall Risk  01/20/2018 01/14/2017 08/07/2016 07/29/2016 06/15/2015  Falls in the past year? No Yes Yes Yes Yes  Number falls in past yr: - 2 or more 2 or more 2 or more 2 or more  Injury with Fall? - No - No Yes  Risk for fall due to : - Impaired mobility - - -  Follow up - Falls prevention discussed;Education provided Education provided;Follow up appointment - -    Depression Screen PHQ 2/9 Scores 01/20/2018 08/08/2017 01/14/2017 08/07/2016  PHQ - 2 Score 0 0 1 0  PHQ- 9 Score 5 0 - -     Cognitive Function MMSE - Mini Mental State Exam 01/20/2018 08/07/2016  Not completed: - (No Data)  Orientation to time 5 -  Orientation to Place 5 -  Registration 3 -  Attention/ Calculation 5 -  Recall 2 -  Language- name 2 objects 2 -  Language- repeat 1 -  Language- follow 3 step command 3 -  Language- read & follow direction 1 -  Write a sentence 1 -  Copy design 1 -  Total score 29 -        Immunization History  Administered Date(s) Administered  . Influenza Split 08/20/2011,  07/30/2012, 06/28/2014  . Influenza Whole 07/28/2009, 07/06/2010  . Influenza,inj,Quad PF,6+ Mos 06/23/2013, 07/05/2015, 06/20/2016, 06/23/2017    . Influenza-Unspecified 06/28/2014  . Pneumococcal Conjugate-13 06/15/2015  . Pneumococcal Polysaccharide-23 10/02/2011  . Td 11/23/2013   Screening Tests Health Maintenance  Topic Date Due  . OPHTHALMOLOGY EXAM  11/29/2015  . HEMOGLOBIN A1C  02/06/2018  . FOOT EXAM  07/01/2018  . MAMMOGRAM  11/26/2018  . TETANUS/TDAP  11/24/2023  . COLONOSCOPY  01/12/2024  . INFLUENZA VACCINE  Completed  . DEXA SCAN  Completed  . Hepatitis C Screening  Completed  . PNA vac Low Risk Adult  Completed       Plan:     Patient signed release form to obtain eye exam results from Dr. Rachael Fee office.   Continue doing brain stimulating activities (puzzles, reading, adult coloring books, staying active) to keep memory sharp.   Continue to eat heart healthy diet (full of fruits, vegetables, whole grains, lean protein, water--limit salt, fat, and sugar intake) and increase physical activity as tolerated.  I have personally reviewed and noted the following in the patient's chart:   . Medical and social history . Use of alcohol, tobacco or illicit drugs  . Current medications and supplements . Functional ability and status . Nutritional status . Physical activity . Advanced directives . List of other physicians . Vitals . Screenings to include cognitive, depression, and falls . Referrals and appointments  In addition, I have reviewed and discussed with patient certain preventive protocols, quality metrics, and best practice recommendations. A written personalized care plan for preventive services as well as general preventive health recommendations were provided to patient.     Michiel Cowboy, RN  01/20/2018    Medical screening examination/treatment/procedure(s) were performed by non-physician practitioner and as supervising physician I was immediately available for consultation/collaboration. I agree with above. Binnie Rail, MD

## 2018-01-20 NOTE — Patient Instructions (Addendum)
[email or web address] or down load app on smart phone  Aunt Berenice Primas website lists multiple social resources for individuals such as: food, health, money, house hold goods, transit, medical supplies, job training and legal services.  Continue doing brain stimulating activities (puzzles, reading, adult coloring books, staying active) to keep memory sharp.   Continue to eat heart healthy diet (full of fruits, vegetables, whole grains, lean protein, water--limit salt, fat, and sugar intake) and increase physical activity as tolerated.   [name] , Thank you for taking time to come for your Medicare Wellness Visit. I appreciate your ongoing commitment to your health goals. Please review the following plan we discussed and let me know if I can assist you in the future.   These are the goals we discussed: Goals    . Patient Stated     Take at least 30 minutes daily just for me to relax, meditate do what speaks to me.        This is a list of the screening recommended for you and due dates:  Health Maintenance  Topic Date Due  . Eye exam for diabetics  11/29/2015  . Hemoglobin A1C  02/06/2018  . Complete foot exam   07/01/2018  . Mammogram  11/26/2018  . Tetanus Vaccine  11/24/2023  . Colon Cancer Screening  01/12/2024  . Flu Shot  Completed  . DEXA scan (bone density measurement)  Completed  .  Hepatitis C: One time screening is recommended by Center for Disease Control  (CDC) for  adults born from [birth year] through 1966.   Completed  . Pneumonia vaccines  Completed    Insomnia Insomnia is a sleep disorder that makes it difficult to fall asleep or to stay asleep. Insomnia can cause tiredness (fatigue), low energy, difficulty concentrating, mood swings, and poor performance at work or school. There are three different ways to classify insomnia:  Difficulty falling asleep.  Difficulty staying asleep.  Waking up too early in the morning.  Any type of insomnia can be long-term (chronic) or  short-term (acute). Both are common. Short-term insomnia usually lasts for three months or less. Chronic insomnia occurs at least three times a week for longer than three months. What are the causes? Insomnia may be caused by another condition, situation, or substance, such as:  Anxiety.  Certain medicines.  Gastroesophageal reflux disease (GERD) or other gastrointestinal conditions.  Asthma or other breathing conditions.  Restless legs syndrome, sleep apnea, or other sleep disorders.  Chronic pain.  Menopause. This may include hot flashes.  Stroke.  Abuse of alcohol, tobacco, or illegal drugs.  Depression.  Caffeine.  Neurological disorders, such as Alzheimer disease.  An overactive thyroid (hyperthyroidism).  The cause of insomnia may not be known. What increases the risk? Risk factors for insomnia include:  Gender. Women are more commonly affected than men.  Age. Insomnia is more common as you get older.  Stress. This may involve your professional or personal life.  Income. Insomnia is more common in people with lower income.  Lack of exercise.  Irregular work schedule or night shifts.  Traveling between different time zones.  What are the signs or symptoms? If you have insomnia, trouble falling asleep or trouble staying asleep is the main symptom. This may lead to other symptoms, such as:  Feeling fatigued.  Feeling nervous about going to sleep.  Not feeling rested in the morning.  Having trouble concentrating.  Feeling irritable, anxious, or depressed.  How is this treated? Treatment for insomnia depends  on the cause. If your insomnia is caused by an underlying condition, treatment will focus on addressing the condition. Treatment may also include:  Medicines to help you sleep.  Counseling or therapy.  Lifestyle adjustments.  Follow these instructions at home:  Take medicines only as directed by your health care provider.  Keep regular  sleeping and waking hours. Avoid naps.  Keep a sleep diary to help you and your health care provider figure out what could be causing your insomnia. Include: ? When you sleep. ? When you wake up during the night. ? How well you sleep. ? How rested you feel the next day. ? Any side effects of medicines you are taking. ? What you eat and drink.  Make your bedroom a comfortable place where it is easy to fall asleep: ? Put up shades or special blackout curtains to block light from outside. ? Use a white noise machine to block noise. ? Keep the temperature cool.  Exercise regularly as directed by your health care provider. Avoid exercising right before bedtime.  Use relaxation techniques to manage stress. Ask your health care provider to suggest some techniques that may work well for you. These may include: ? Breathing exercises. ? Routines to release muscle tension. ? Visualizing peaceful scenes.  Cut back on alcohol, caffeinated beverages, and cigarettes, especially close to bedtime. These can disrupt your sleep.  Do not overeat or eat spicy foods right before bedtime. This can lead to digestive discomfort that can make it hard for you to sleep.  Limit screen use before bedtime. This includes: ? Watching TV. ? Using your smartphone, tablet, and computer.  Stick to a routine. This can help you fall asleep faster. Try to do a quiet activity, brush your teeth, and go to bed at the same time each night.  Get out of bed if you are still awake after 15 minutes of trying to sleep. Keep the lights down, but try reading or doing a quiet activity. When you feel sleepy, go back to bed.  Make sure that you drive carefully. Avoid driving if you feel very sleepy.  Keep all follow-up appointments as directed by your health care provider. This is important. Contact a health care provider if:  You are tired throughout the day or have trouble in your daily routine due to sleepiness.  You continue  to have sleep problems or your sleep problems get worse. Get help right away if:  You have serious thoughts about hurting yourself or someone else. This information is not intended to replace advice given to you by your health care provider. Make sure you discuss any questions you have with your health care provider. Document Released: 10/11/2000 Document Revised: 03/15/2016 Document Reviewed: 07/15/2014 Elsevier Interactive Patient Education  2018 [place] and Stress Management Stress is a normal reaction to life events. It is what you feel when life demands more than you are used to or more than you can handle. Some stress can be useful. For example, the stress reaction can help you catch the last bus of the day, study for a test, or meet a deadline at work. But stress that occurs too often or for too long can cause problems. It can affect your emotional health and interfere with relationships and normal daily activities. Too much stress can weaken your immune system and increase your risk for physical illness. If you already have a medical problem, stress can make it worse. What are the causes? All sorts  of life events may cause stress. An event that causes stress for one person may not be stressful for another person. Major life events commonly cause stress. These may be positive or negative. Examples include losing your job, moving into a new home, getting married, having a baby, or losing a loved one. Less obvious life events may also cause stress, especially if they occur day after day or in combination. Examples include working long hours, driving in traffic, caring for children, being in debt, or being in a difficult relationship. What are the signs or symptoms? Stress may cause emotional symptoms including, the following:  Anxiety. This is feeling worried, afraid, on edge, overwhelmed, or out of control.  Anger. This is feeling irritated or impatient.  Depression. This is  feeling sad, down, helpless, or guilty.  Difficulty focusing, remembering, or making decisions.  Stress may cause physical symptoms, including the following:  Aches and pains. These may affect your head, neck, back, stomach, or other areas of your body.  Tight muscles or clenched jaw.  Low energy or trouble sleeping.  Stress may cause unhealthy behaviors, including the following:  Eating to feel better (overeating) or skipping meals.  Sleeping too little, too much, or both.  Working too much or putting off tasks (procrastination).  Smoking, drinking alcohol, or using drugs to feel better.  How is this diagnosed? Stress is diagnosed through an assessment by your health care provider. Your health care provider will ask questions about your symptoms and any stressful life events.Your health care provider will also ask about your medical history and may order blood tests or other tests. Certain medical conditions and medicine can cause physical symptoms similar to stress. Mental illness can cause emotional symptoms and unhealthy behaviors similar to stress. Your health care provider may refer you to a mental health professional for further evaluation. How is this treated? Stress management is the recommended treatment for stress.The goals of stress management are reducing stressful life events and coping with stress in healthy ways. Techniques for reducing stressful life events include the following:  Stress identification. Self-monitor for stress and identify what causes stress for you. These skills may help you to avoid some stressful events.  Time management. Set your priorities, keep a calendar of events, and learn to say "no." These tools can help you avoid making too many commitments.  Techniques for coping with stress include the following:  Rethinking the problem. Try to think realistically about stressful events rather than ignoring them or overreacting. Try to find the  positives in a stressful situation rather than focusing on the negatives.  Exercise. Physical exercise can release both physical and emotional tension. The key is to find a form of exercise you enjoy and do it regularly.  Relaxation techniques. These relax the body and mind. Examples include yoga, meditation, tai chi, biofeedback, deep breathing, progressive muscle relaxation, listening to music, being out in nature, journaling, and other hobbies. Again, the key is to find one or more that you enjoy and can do regularly.  Healthy lifestyle. Eat a balanced diet, get plenty of sleep, and do not smoke. Avoid using alcohol or drugs to relax.  Strong support network. Spend time with family, friends, or other people you enjoy being around.Express your feelings and talk things over with someone you trust.  Counseling or talktherapy with a mental health professional may be helpful if you are having difficulty managing stress on your own. Medicine is typically not recommended for the treatment of stress.Talk to  your health care provider if you think you need medicine for symptoms of stress. Follow these instructions at home:  Keep all follow-up visits as directed by your health care provider.  Take all medicines as directed by your health care provider. Contact a health care provider if:  Your symptoms get worse or you start having new symptoms.  You feel overwhelmed by your problems and can no longer manage them on your own. Get help right away if:  You feel like hurting yourself or someone else. This information is not intended to replace advice given to you by your health care provider. Make sure you discuss any questions you have with your health care provider. Document Released: 04/09/2001 Document Revised: 03/21/2016 Document Reviewed: 06/08/2013 Elsevier Interactive Patient Education  2017 [employer].

## 2018-02-03 ENCOUNTER — Other Ambulatory Visit: Payer: Self-pay | Admitting: Emergency Medicine

## 2018-02-03 MED ORDER — ALPRAZOLAM 0.5 MG PO TABS
ORAL_TABLET | ORAL | 0 refills | Status: DC
Start: 2018-02-03 — End: 2018-06-06

## 2018-02-03 NOTE — Telephone Encounter (Signed)
[place] Controlled Substance Database checked. Last filled on 06/24/17

## 2018-02-09 NOTE — Progress Notes (Signed)
Subjective:    Patient ID: Heidi Franklin, female    DOB: 05-14-1946, 72 y.o.   MRN: [id number]  HPI The patient is here for follow up.  Diabetes: She is taking her medication daily as prescribed. She is compliant with a diabetic diet. She is not exercising regularly. She monitors her sugars and it was 155 this morning - she did eat ice cream last night. She checks her feet daily and denies foot lesions. She is up-to-date with an ophthalmology examination she thinks.   Hypertension: She is taking her medication daily. She is compliant with a low sodium diet.  She denies chest pain, palpitations, edema, regular shortness of breath and regular headaches. She is not exercising regularly.     Hyperlipidemia: She is taking her medication daily. She is compliant with a low fat/cholesterol diet. She is not exercising regularly. She denies myalgias.   GERD:  She is taking her medication daily as prescribed.  She denies any GERD symptoms and feels her GERD is well controlled.   Depression, anxiety: She is taking her medication daily as prescribed. She denies any side effects from the medication. She feels her depression and anxiety are not ideally controlled.  She takes the xanax as needed.    Left ear is stopped up x 2 weeks.  She denies ear pain.  She has decreased hearing in the left ear.  She has allergies and is taking her allergy medication on a daily basis including oral antihistamine and Flonase twice daily.  She is not currently taking Singulair.  Medications and allergies reviewed with patient and updated if appropriate.  Patient Active Problem List   Diagnosis Date Noted  . Herpes simplex 02/10/2018  . ETD (eustachian tube dysfunction) 02/10/2018  . Peroneal tendinitis, right 01/06/2018  . Head injury 11/21/2017  . Psoriasis 11/14/2017  . Muscle tightness 11/14/2017  . Muscle twitching 11/14/2017  . Acute bursitis of right shoulder 09/29/2017  . Bilateral hip pain 07/10/2017  .  Bronchiectasis without complication ([place]) 02/63/7858  . Irritable larynx syndrome 04/25/2017  . Benign lipomatous neoplasm of skin and subcutaneous tissue of right leg 02/25/2017  . Anemia 02/05/2017  . Fatigue 01/23/2017  . Chronic cough 01/02/2017  . Multiple pulmonary nodules 11/10/2016  . Acute midline low back pain with right-sided sciatica 10/09/2016  . Fall 08/08/2016  . Neck pain 07/29/2016  . Degenerative arthritis of right knee 06/20/2016  . Foot pain, left 05/28/2016  . Degenerative arthritis of left knee 04/10/2016  . Peroneal tendinitis of left lower extremity 09/05/2015  . Recurrent falls 08/08/2015  . Bursitis of left shoulder 07/13/2015  . Morbid obesity due to excess calories ([employer]) 04/16/2015  . Nonspecific abnormal electrocardiogram (ECG) (EKG) 05/11/2011  . Tenosynovitis of finger 04/22/2011  . Type 2 diabetes, uncontrolled, with neuropathy ([place]) 03/02/2010  . Hyperlipidemia associated with type 2 diabetes mellitus ([place]) 03/02/2010  . Depression with anxiety 03/02/2010  . Essential hypertension 03/02/2010  . Chronic rhinitis 03/02/2010  . Upper airway cough syndrome 03/02/2010  . GERD 03/02/2010    Current Outpatient Medications on File Prior to Visit  Medication Sig Dispense Refill  . acetaminophen (TYLENOL) 325 MG tablet Take 650 mg by mouth every 6 (six) hours as needed.    Marland Kitchen albuterol (VENTOLIN HFA) 108 (90 Base) MCG/ACT inhaler Inhale 1-2 puffs into the lungs every 6 (six) hours as needed for wheezing or shortness of breath. 1 Inhaler 3  . ALPRAZolam (XANAX) 0.5 MG tablet take 1 tablet by mouth  every 8 hours if needed for anxiety 30 tablet 0  . amLODipine (NORVASC) 10 MG tablet TAKE 1 TABLET EVERY DAY 90 tablet 1  . aspirin 81 MG tablet Take 81 mg by mouth every morning.     . Calcium Citrate-Vitamin D (CALCIUM + D PO) Take 1 tablet by mouth every morning.    . chlorpheniramine (CHLOR-TRIMETON) 4 MG tablet Take 2 tabs by mouth at bedtime and every 4 hours  as needed for drainage/drippy nose/sneezing    . famotidine (PEPCID) 20 MG tablet Take 1 tablet (20 mg total) by mouth at bedtime. 30 tablet 11  . fluocinonide ointment (LIDEX) 0.93 % Apply 1 application topically as needed.     . fluticasone (FLONASE) 50 MCG/ACT nasal spray USE 1 SPRAY IN EACH NOSTRIL TWICE DAILY 48 g 1  . glucose blood (ONE TOUCH ULTRA TEST) test strip 1 each by Other route daily. Use 1 strips to check blood sugar twice a day Ex E11.9 100 each 3  . glyBURIDE (DIABETA) 2.5 MG tablet TAKE 1 TABLET EVERY DAY WITH BREAKFAST 90 tablet 1  . ibuprofen (ADVIL,MOTRIN) 600 MG tablet Take 1 tablet (600 mg total) by mouth every 6 (six) hours as needed. 30 tablet 0  . Lancets (ONETOUCH ULTRASOFT) lancets PRN 100 each 3  . losartan-hydrochlorothiazide (HYZAAR) 100-12.5 MG tablet Take 1 tablet by mouth daily. 90 tablet 0  . lovastatin (MEVACOR) 20 MG tablet TAKE 1 TABLET AT BEDTIME 90 tablet 1  . meloxicam (MOBIC) 7.5 MG tablet Take 1 tablet (7.5 mg total) by mouth daily. 30 tablet 0  . mometasone (ELOCON) 0.1 % ointment     . Multiple Vitamins-Minerals (CENTRUM SILVER PO) Take 1 tablet by mouth every morning.    Marland Kitchen omeprazole (PRILOSEC) 40 MG capsule Take 40 mg by mouth daily.    . Potassium Chloride ER 20 MEQ TBCR TAKE 1 TABLET EVERY DAY 90 tablet 3  . Respiratory Therapy Supplies (FLUTTER) DEVI Use as directed 1 each 0  . sertraline (ZOLOFT) 100 MG tablet TAKE 1 TABLET EVERY MORNING 90 tablet 1  . Vitamin D, Ergocalciferol, (DRISDOL) 50000 units CAPS capsule Take 1 capsule (50,000 Units total) by mouth every 7 (seven) days. 12 capsule 0   No current facility-administered medications on file prior to visit.     Past Medical History:  Diagnosis Date  . ALLERGIC RHINITIS   . Allergy   . Anxiety state, unspecified   . Arthritis   . BRONCHITIS, CHRONIC   . DEPRESSION   . DIABETES MELLITUS, TYPE II   . GERD   . HYPERLIPIDEMIA   . HYPERTENSION   . Psoriasis     Past Surgical  History:  Procedure Laterality Date  . ABDOMINAL HYSTERECTOMY  1980  . TRIGGER FINGER RELEASE     Index finger    Social History   Socioeconomic History  . Marital status: Married    Spouse name: Not on file  . Number of children: 0  . Years of education: 37yr colge  . Highest education level: Not on file  Occupational History  . Occupation: Retired   Scientific laboratory technician  . Financial resource strain: Somewhat hard  . Food insecurity:    Worry: Sometimes true    Inability: Sometimes true  . Transportation needs:    Medical: No    Non-medical: No  Tobacco Use  . Smoking status: Never Smoker  . Smokeless tobacco: Never Used  Substance and Sexual Activity  . Alcohol use: No  Alcohol/week: 0.0 oz  . Drug use: No  . Sexual activity: Not Currently  Lifestyle  . Physical activity:    Days per week: 0 days    Minutes per session: 0 min  . Stress: Very much  Relationships  . Social connections:    Talks on phone: More than three times a week    Gets together: More than three times a week    Attends religious service: More than 4 times per year    Active member of club or organization: Yes    Attends meetings of clubs or organizations: More than 4 times per year    Relationship status: Married  Other Topics Concern  . Not on file  Social History Narrative   Married, lives with spouse-retired from [employer] insurance   Not employed    Drinks coffee occasional, Consumes 1 soda a day     Family History  Problem Relation Age of Onset  . Stroke Mother   . Angina Mother   . Diabetes Father   . Hyperlipidemia Other        Parent  . Hypertension Other        Parent  . Diabetes Sister        x1  . CAD Sister   . Diabetes Brother        x 2  . Colon cancer Neg Hx   . Esophageal cancer Neg Hx   . Rectal cancer Neg Hx   . Stomach cancer Neg Hx   . Pancreatic cancer Neg Hx     Review of Systems  Constitutional: Negative for chills and fever.  HENT: Positive for congestion  (mild). Negative for ear pain, sinus pain and sore throat.   Respiratory: Positive for cough (pnd/allergies) and shortness of breath (has used inhaler). Negative for wheezing.   Cardiovascular: Negative for chest pain, palpitations and leg swelling.  Neurological: Negative for light-headedness and headaches.       Objective:   Vitals:   02/10/18 1117  BP: (!) 122/56  Pulse: 82  Resp: 16  Temp: 98.4 F (36.9 C)  SpO2: 98%   BP Readings from Last 3 Encounters:  02/10/18 (!) 122/56  01/20/18 (!) 148/66  01/15/18 123/72   Wt Readings from Last 3 Encounters:  02/10/18 164 lb (74.4 kg)  01/20/18 164 lb (74.4 kg)  01/15/18 164 lb (74.4 kg)   Body mass index is 32.03 kg/m.   Physical Exam    Constitutional: Appears well-developed and well-nourished. No distress.  HENT:  Head: Normocephalic and atraumatic.  Neck: Neck supple. No tracheal deviation present. No thyromegaly present.  No cervical lymphadenopathy Cardiovascular: Normal rate, regular rhythm and normal heart sounds.   No murmur heard. No carotid bruit .  No edema Pulmonary/Chest: Effort normal and breath sounds normal. No respiratory distress. No has no wheezes. No rales.  Skin: Skin is warm and dry. Not diaphoretic.  Psychiatric: Normal mood and affect. Behavior is normal.      Assessment & Plan:    See Problem List for Assessment and Plan of chronic medical problems.

## 2018-02-09 NOTE — Patient Instructions (Addendum)
  Test(s) ordered today. Your results will be released to [facility] (or called to you) after review, usually within 72hours after test completion. If any changes need to be made, you will be notified at that same time.   Medications reviewed and updated.  Changes include using valtrex as needed and restarting singulair.   Your prescription(s) have been submitted to your pharmacy. Please take as directed and contact our office if you believe you are having problem(s) with the medication(s).   Please followup in 6 months

## 2018-02-10 ENCOUNTER — Other Ambulatory Visit (INDEPENDENT_AMBULATORY_CARE_PROVIDER_SITE_OTHER): Payer: Medicare Other

## 2018-02-10 ENCOUNTER — Ambulatory Visit (INDEPENDENT_AMBULATORY_CARE_PROVIDER_SITE_OTHER): Payer: Medicare Other | Admitting: Internal Medicine

## 2018-02-10 ENCOUNTER — Encounter: Payer: Self-pay | Admitting: Internal Medicine

## 2018-02-10 VITALS — BP 122/56 | HR 82 | Temp 98.4°F | Resp 16 | Wt 164.0 lb

## 2018-02-10 DIAGNOSIS — E785 Hyperlipidemia, unspecified: Secondary | ICD-10-CM

## 2018-02-10 DIAGNOSIS — I10 Essential (primary) hypertension: Secondary | ICD-10-CM

## 2018-02-10 DIAGNOSIS — K219 Gastro-esophageal reflux disease without esophagitis: Secondary | ICD-10-CM

## 2018-02-10 DIAGNOSIS — J310 Chronic rhinitis: Secondary | ICD-10-CM | POA: Diagnosis not present

## 2018-02-10 DIAGNOSIS — B009 Herpesviral infection, unspecified: Secondary | ICD-10-CM | POA: Insufficient documentation

## 2018-02-10 DIAGNOSIS — E1169 Type 2 diabetes mellitus with other specified complication: Secondary | ICD-10-CM

## 2018-02-10 DIAGNOSIS — H6982 Other specified disorders of Eustachian tube, left ear: Secondary | ICD-10-CM | POA: Diagnosis not present

## 2018-02-10 DIAGNOSIS — E1140 Type 2 diabetes mellitus with diabetic neuropathy, unspecified: Secondary | ICD-10-CM

## 2018-02-10 DIAGNOSIS — E1165 Type 2 diabetes mellitus with hyperglycemia: Secondary | ICD-10-CM

## 2018-02-10 DIAGNOSIS — H6980 Other specified disorders of Eustachian tube, unspecified ear: Secondary | ICD-10-CM | POA: Insufficient documentation

## 2018-02-10 DIAGNOSIS — IMO0002 Reserved for concepts with insufficient information to code with codable children: Secondary | ICD-10-CM

## 2018-02-10 DIAGNOSIS — F418 Other specified anxiety disorders: Secondary | ICD-10-CM

## 2018-02-10 DIAGNOSIS — D649 Anemia, unspecified: Secondary | ICD-10-CM

## 2018-02-10 LAB — CBC WITH DIFFERENTIAL/PLATELET
Basophils Absolute: 0 10*3/uL (ref 0.0–0.1)
Basophils Relative: 0.4 % (ref 0.0–3.0)
Eosinophils Absolute: 0.1 10*3/uL (ref 0.0–0.7)
Eosinophils Relative: 1.4 % (ref 0.0–5.0)
HCT: 34.3 % — ABNORMAL LOW (ref 36.0–46.0)
Hemoglobin: 11.4 g/dL — ABNORMAL LOW (ref 12.0–15.0)
Lymphocytes Relative: 25.8 % (ref 12.0–46.0)
Lymphs Abs: 2.2 10*3/uL (ref 0.7–4.0)
MCHC: 33.3 g/dL (ref 30.0–36.0)
MCV: 86.5 fl (ref 78.0–100.0)
Monocytes Absolute: 0.6 10*3/uL (ref 0.1–1.0)
Monocytes Relative: 6.6 % (ref 3.0–12.0)
Neutro Abs: 5.7 10*3/uL (ref 1.4–7.7)
Neutrophils Relative %: 65.8 % (ref 43.0–77.0)
Platelets: 265 10*3/uL (ref 150.0–400.0)
RBC: 3.96 Mil/uL (ref 3.87–5.11)
RDW: 14.3 % (ref 11.5–15.5)
WBC: 8.7 10*3/uL (ref 4.0–10.5)

## 2018-02-10 LAB — LIPID PANEL
Cholesterol: 149 mg/dL (ref 0–200)
HDL: 33.5 mg/dL — ABNORMAL LOW (ref >39.00)
LDL Cholesterol: 84 mg/dL (ref 0–99)
NonHDL: 115.47
Total CHOL/HDL Ratio: 4
Triglycerides: 155 mg/dL — ABNORMAL HIGH (ref 0.0–149.0)
VLDL: 31 mg/dL (ref 0.0–40.0)

## 2018-02-10 LAB — COMPREHENSIVE METABOLIC PANEL
ALT: 16 U/L (ref 0–35)
AST: 18 U/L (ref 0–37)
Albumin: 3.8 g/dL (ref 3.5–5.2)
Alkaline Phosphatase: 68 U/L (ref 39–117)
BUN: 15 mg/dL (ref 6–23)
CO2: 30 mEq/L (ref 19–32)
Calcium: 9.2 mg/dL (ref 8.4–10.5)
Chloride: 104 mEq/L (ref 96–112)
Creatinine, Ser: 0.84 mg/dL (ref 0.40–1.20)
GFR: 85.49 mL/min (ref >60.00)
Glucose, Bld: 105 mg/dL — ABNORMAL HIGH (ref 70–99)
Potassium: 3.4 mEq/L — ABNORMAL LOW (ref 3.5–5.1)
Sodium: 141 mEq/L (ref 135–145)
Total Bilirubin: 0.3 mg/dL (ref 0.2–1.2)
Total Protein: 7.5 g/dL (ref 6.0–8.3)

## 2018-02-10 LAB — TSH: TSH: 2.85 u[IU]/mL (ref 0.35–4.50)

## 2018-02-10 LAB — HEMOGLOBIN A1C: Hgb A1c MFr Bld: 6.6 % — ABNORMAL HIGH (ref 4.6–6.5)

## 2018-02-10 MED ORDER — MONTELUKAST SODIUM 10 MG PO TABS: 10.0000 mg | ORAL_TABLET | Freq: Every day | ORAL | 5 refills | Status: DC

## 2018-02-10 MED ORDER — VALACYCLOVIR HCL 1 G PO TABS: ORAL_TABLET | ORAL | 5 refills | Status: DC

## 2018-02-10 NOTE — Assessment & Plan Note (Signed)
Has recurrent sores on lips Prescribed Valtrex in the emergency room and it did help We will refill-discussed to take it as soon as she has symptoms of a sore coming on.  Discussed that if she has frequent sore she should go on a daily dose for suppression

## 2018-02-10 NOTE — Assessment & Plan Note (Signed)
Left-sided Likely related to allergies Exam is normal Continue Flonase twice daily Given her blood pressure is lower she can try taking Sudafed a couple of times to see if that helps If no improvement can refer to ENT

## 2018-02-10 NOTE — Assessment & Plan Note (Signed)
Still with significant depression and anxiety Not ideally controlled Declined increasing sertraline to 150 mg daily Doing see the wellness nurse and was given several suggestions to help with anxiety-advised her to try these things Stressed regular exercise Continue sertraline 100 mg daily

## 2018-02-10 NOTE — Assessment & Plan Note (Signed)
Blood pressure well controlled Continue current medications at current doses Check CMP

## 2018-02-10 NOTE — Assessment & Plan Note (Signed)
We will check to make sure eye exams are up-to-date Check A1c today Stressed the importance of starting regular exercise for multiple reasons Follow-up in 6 months

## 2018-02-10 NOTE — Assessment & Plan Note (Signed)
Taking Flonase, chlorphentermine Still experiencing symptoms, has left-sided eustachian tube dysfunction Restart Singulair

## 2018-02-10 NOTE — Assessment & Plan Note (Signed)
GERD controlled Continue daily medication  

## 2018-02-10 NOTE — Assessment & Plan Note (Signed)
Check lipid panel  Continue daily statin Regular exercise and healthy diet encouraged  

## 2018-02-10 NOTE — Assessment & Plan Note (Signed)
History of mild anemia Recheck CBC

## 2018-02-17 ENCOUNTER — Encounter: Payer: Self-pay | Admitting: Internal Medicine

## 2018-02-24 ENCOUNTER — Other Ambulatory Visit: Payer: Self-pay | Admitting: Family Medicine

## 2018-02-24 NOTE — Telephone Encounter (Signed)
Refill done.  

## 2018-02-27 ENCOUNTER — Ambulatory Visit: Payer: Self-pay | Admitting: *Deleted

## 2018-02-27 NOTE — Telephone Encounter (Signed)
Is there anything that pt can try OTC

## 2018-02-27 NOTE — Telephone Encounter (Signed)
LVM informing pt

## 2018-02-27 NOTE — Telephone Encounter (Signed)
Pt called with her left ear feeling full like she is going to the mountains in a car. Ears popping. She also states that does have a runny nose with allergies and a cough. No fever.  Attempted to make an appointment with her provider but patient states her sister is coming to her house from the hospital following surgery to recuperate. She can not make an appointment now, but will call back to make an appointment at the Saturday clinic. Will route to flow at [facility] at [facility] Pt advised to call back if symptoms become worse, pt voiced understanding.  Reason for Disposition . Ear congestion present > 48 hours  Answer Assessment - Initial Assessment Questions 1. LOCATION: "Which ear is involved?"       Left ear 2. SENSATION: "Describe how the ear feels."      Feels full 3. ONSET:  "When did the ear symptoms start?"       2 weeks ago 4. PAIN: "Do you also have an earache?" If so, ask: "How bad is it?" (Scale 1-10; or mild, moderate, severe)     no 5. CAUSE: "What do you think is causing the ear congestion?"     Not sure 6. URI: "Do you have a runny nose or cough?"      Bad allergies, has had runny nose and cough 7. NASAL ALLERGIES: "Are there symptoms of hay fever, such as sneezing or a clear nasal discharge?"     no 8. PREGNANCY: "Is there any chance you are pregnant?" "When was your last menstrual period?"     no  Protocols used: EAR - CONGESTION-A-AH

## 2018-02-27 NOTE — Telephone Encounter (Signed)
Use flonase daily  Start claritin daily in morning or zyrtec daily at night for allergies

## 2018-03-06 DIAGNOSIS — L400 Psoriasis vulgaris: Secondary | ICD-10-CM | POA: Diagnosis not present

## 2018-03-06 DIAGNOSIS — D485 Neoplasm of uncertain behavior of skin: Secondary | ICD-10-CM | POA: Diagnosis not present

## 2018-03-06 DIAGNOSIS — B0089 Other herpesviral infection: Secondary | ICD-10-CM | POA: Diagnosis not present

## 2018-03-08 ENCOUNTER — Other Ambulatory Visit: Payer: Self-pay | Admitting: Internal Medicine

## 2018-03-16 ENCOUNTER — Encounter: Payer: Self-pay | Admitting: Internal Medicine

## 2018-03-16 DIAGNOSIS — R252 Cramp and spasm: Secondary | ICD-10-CM

## 2018-03-19 NOTE — Addendum Note (Signed)
Addended by: Binnie Rail on: 03/19/2018 02:05 PM   Modules accepted: Orders

## 2018-03-25 NOTE — Progress Notes (Signed)
[facility] [address] [place], [place] [address] Phone: [phone] Subjective:    CC: Bilateral shoulder twitching  OEV:[id number]  Heidi Franklin is a 72 y.o. female coming in with complaint of right shoulder twitching. She also notes that her left shoulder has begun to do the same thing. Shoulder twitching has been occurring for 1 month. Denies any radicular symptoms. Has not found anything that alleviates that twitching.  Patient has seen me for shoulder bursitis previously.  Does state that some of the pain is still remaining but that does not seem to be the severe amount of pain at the moment.     Past Medical History:  Diagnosis Date  . ALLERGIC RHINITIS   . Allergy   . Anxiety state, unspecified   . Arthritis   . BRONCHITIS, CHRONIC   . DEPRESSION   . DIABETES MELLITUS, TYPE II   . GERD   . HYPERLIPIDEMIA   . HYPERTENSION   . Psoriasis    Past Surgical History:  Procedure Laterality Date  . ABDOMINAL HYSTERECTOMY  1980  . TRIGGER FINGER RELEASE     Index finger   Social History   Socioeconomic History  . Marital status: Married    Spouse name: Not on file  . Number of children: 0  . Years of education: 32yr colge  . Highest education level: Not on file  Occupational History  . Occupation: Retired   Scientific laboratory technician  . Financial resource strain: Somewhat hard  . Food insecurity:    Worry: Sometimes true    Inability: Sometimes true  . Transportation needs:    Medical: No    Non-medical: No  Tobacco Use  . Smoking status: Never Smoker  . Smokeless tobacco: Never Used  Substance and Sexual Activity  . Alcohol use: No    Alcohol/week: 0.0 oz  . Drug use: No  . Sexual activity: Not Currently  Lifestyle  . Physical activity:    Days per week: 0 days    Minutes per session: 0 min  . Stress: Very much  Relationships  . Social connections:    Talks on phone: More than three times a week    Gets together: More than three times  a week    Attends religious service: More than 4 times per year    Active member of club or organization: Yes    Attends meetings of clubs or organizations: More than 4 times per year    Relationship status: Married  Other Topics Concern  . Not on file  Social History Narrative   Married, lives with spouse-retired from [employer] insurance   Not employed    Drinks coffee occasional, Consumes 1 soda a day    Allergies  Allergen Reactions  . Metformin And Related Diarrhea  . Penicillins Rash   Family History  Problem Relation Age of Onset  . Stroke Mother   . Angina Mother   . Diabetes Father   . Hyperlipidemia Other        Parent  . Hypertension Other        Parent  . Diabetes Sister        x1  . CAD Sister   . Diabetes Brother        x 2  . Colon cancer Neg Hx   . Esophageal cancer Neg Hx   . Rectal cancer Neg Hx   . Stomach cancer Neg Hx   . Pancreatic cancer Neg Hx  Past medical history, social, surgical and family history all reviewed in electronic medical record.  No pertanent information unless stated regarding to the chief complaint.   Review of Systems:Review of systems updated and as accurate as of 03/26/18  No headache, visual changes, nausea, vomiting, diarrhea, constipation, dizziness, abdominal pain, skin rash, fevers, chills, night sweats, weight loss, swollen lymph nodes, body aches, joint swelling,  chest pain, shortness of breath, mood changes.  Mild positive muscle aches  Objective  Blood pressure 118/66, pulse 66, height 5' (1.524 m), weight 165 lb (74.8 kg), SpO2 98 %. Systems examined below as of 03/26/18   General: No apparent distress alert and oriented x3 mood and affect normal, dressed appropriately.  HEENT: Pupils equal, extraocular movements intact  Respiratory: Patient's speak in full sentences and does not appear short of breath  Cardiovascular: No lower extremity edema, non tender, no erythema  Skin: Warm dry intact with no signs of  infection or rash on extremities or on axial skeleton.  Abdomen: Soft nontender  Neuro: Cranial nerves II through XII are intact, neurovascularly intact in all extremities with 2+ DTRs and 2+ pulses.  Lymph: No lymphadenopathy of posterior or anterior cervical chain or axillae bilaterally.  Gait normal with good balance and coordination.  MSK:  Non tender with full range of motion and good stability and symmetric strength and tone of  elbows, wrist, hip, knee and ankles bilaterally.  Shoulder: Bilateral Inspection reveals no abnormalities, atrophy or asymmetry. Palpation is normal with no tenderness over AC joint or bicipital groove. ROM is full in all planes. Rotator cuff strength normal throughout. Positive impingement Speeds and Yergason's tests normal. Positive O'Brien's of the right Normal scapular function observed. No painful arc and no drop arm sign. No apprehension sign      Impression and Recommendations:     This case required medical decision making of moderate complexity.      Note: This dictation was prepared with Dragon dictation along with smaller phrase technology. Any transcriptional errors that result from this process are unintentional.

## 2018-03-26 ENCOUNTER — Encounter: Payer: Self-pay | Admitting: Family Medicine

## 2018-03-26 ENCOUNTER — Ambulatory Visit (INDEPENDENT_AMBULATORY_CARE_PROVIDER_SITE_OTHER): Payer: Medicare Other | Admitting: Family Medicine

## 2018-03-26 DIAGNOSIS — R253 Fasciculation: Secondary | ICD-10-CM | POA: Diagnosis not present

## 2018-03-26 NOTE — Patient Instructions (Signed)
You are great  Iron 65mg  with 500mg  of vitamin C at least most days of the week if not daily  Have a banana a day to help a little with the potassium  Stay active Write me in 2 weeks and tell me how you are doing.  Otherwise see me when you need me

## 2018-03-26 NOTE — Assessment & Plan Note (Signed)
Discussed iron deficiency with it being at night, given topical NSAIds mild low potassium  Discussed which activities to do  RTC in 4 weeks if not resolved

## 2018-04-08 ENCOUNTER — Encounter: Payer: Self-pay | Admitting: Internal Medicine

## 2018-04-08 ENCOUNTER — Ambulatory Visit (INDEPENDENT_AMBULATORY_CARE_PROVIDER_SITE_OTHER): Payer: Medicare Other | Admitting: Internal Medicine

## 2018-04-08 DIAGNOSIS — M109 Gout, unspecified: Secondary | ICD-10-CM

## 2018-04-08 MED ORDER — PREDNISONE 10 MG PO TABS: ORAL_TABLET | ORAL | 0 refills | Status: DC

## 2018-04-08 NOTE — Assessment & Plan Note (Addendum)
Right fifth MCP joint - started two days ago.  She is experiencing pain, swelling, redness and warmth associated with decreased range of motion No obvious injury Likely gout She is taking some NSAIDs without improvement We will do a prednisone taper since sugars are well controlled Reviewed potential causes Discussed importance of being compliant with a low sugar/carbohydrate diet since the prednisone will increase her sugars We will check uric acid level in the fall with her next blood draw If she has another episode or uric acid level high will start allopurinol

## 2018-04-08 NOTE — Patient Instructions (Addendum)
Take the prednisone as prescribed.  You can ice the area.  Call if no improvement      Gout Gout is painful swelling that can occur in some of your joints. Gout is a type of arthritis. This condition is caused by having too much uric acid in your body. Uric acid is a chemical that forms when your body breaks down substances called purines. Purines are important for building body proteins. When your body has too much uric acid, sharp crystals can form and build up inside your joints. This causes pain and swelling. Gout attacks can happen quickly and be very painful (acute gout). Over time, the attacks can affect more joints and become more frequent (chronic gout). Gout can also cause uric acid to build up under your skin and inside your kidneys. What are the causes? This condition is caused by too much uric acid in your blood. This can occur because:  Your kidneys do not remove enough uric acid from your blood. This is the most common cause.  Your body makes too much uric acid. This can occur with some cancers and cancer treatments. It can also occur if your body is breaking down too many red blood cells (hemolytic anemia).  You eat too many foods that are high in purines. These foods include organ meats and some seafood. Alcohol, especially beer, is also high in purines.  A gout attack may be triggered by trauma or stress. What increases the risk? This condition is more likely to develop in people who:  Have a family history of gout.  Are female and middle-aged.  Are female and have gone through menopause.  Are obese.  Frequently drink alcohol, especially beer.  Are dehydrated.  Lose weight too quickly.  Have an organ transplant.  Have lead poisoning.  Take certain medicines, including aspirin, cyclosporine, diuretics, levodopa, and niacin.  Have kidney disease or psoriasis.  What are the signs or symptoms? An attack of acute gout happens quickly. It usually occurs in just  one joint. The most common place is the big toe. Attacks often start at night. Other joints that may be affected include joints of the feet, ankle, knee, fingers, wrist, or elbow. Symptoms may include:  Severe pain.  Warmth.  Swelling.  Stiffness.  Tenderness. The affected joint may be very painful to touch.  Shiny, red, or purple skin.  Chills and fever.  Chronic gout may cause symptoms more frequently. More joints may be involved. You may also have white or yellow lumps (tophi) on your hands or feet or in other areas near your joints. How is this diagnosed? This condition is diagnosed based on your symptoms, medical history, and physical exam. You may have tests, such as:  Blood tests to measure uric acid levels.  Removal of joint fluid with a needle (aspiration) to look for uric acid crystals.  X-rays to look for joint damage.  How is this treated? Treatment for this condition has two phases: treating an acute attack and preventing future attacks. Acute gout treatment may include medicines to reduce pain and swelling, including:  NSAIDs.  Steroids. These are strong anti-inflammatory medicines that can be taken by mouth (orally) or injected into a joint.  Colchicine. This medicine relieves pain and swelling when it is taken soon after an attack. It can be given orally or through an IV tube.  Preventive treatment may include:  Daily use of smaller doses of NSAIDs or colchicine.  Use of a medicine that reduces uric  acid levels in your blood.  Changes to your diet. You may need to see a specialist about healthy eating (dietitian).  Follow these instructions at home: During a Gout Attack  If directed, apply ice to the affected area: ? Put ice in a plastic bag. ? Place a towel between your skin and the bag. ? Leave the ice on for 20 minutes, 2-3 times a day.  Rest the joint as much as possible. If the affected joint is in your leg, you may be given crutches to  use.  Raise (elevate) the affected joint above the level of your heart as often as possible.  Drink enough fluids to keep your urine clear or pale yellow.  Take over-the-counter and prescription medicines only as told by your health care provider.  Do not drive or operate heavy machinery while taking prescription pain medicine.  Follow instructions from your health care provider about eating or drinking restrictions.  Return to your normal activities as told by your health care provider. Ask your health care provider what activities are safe for you. Avoiding Future Gout Attacks  Follow a low-purine diet as told by your dietitian or health care provider. Avoid foods and drinks that are high in purines, including liver, kidney, anchovies, asparagus, herring, mushrooms, mussels, and beer.  Limit alcohol intake to no more than 1 drink a day for nonpregnant women and 2 drinks a day for men. One drink equals 12 oz of beer, 5 oz of wine, or 1 oz of hard liquor.  Maintain a healthy weight or lose weight if you are overweight. If you want to lose weight, talk with your health care provider. It is important that you do not lose weight too quickly.  Start or maintain an exercise program as told by your health care provider.  Drink enough fluids to keep your urine clear or pale yellow.  Take over-the-counter and prescription medicines only as told by your health care provider.  Keep all follow-up visits as told by your health care provider. This is important. Contact a health care provider if:  You have another gout attack.  You continue to have symptoms of a gout attack after10 days of treatment.  You have side effects from your medicines.  You have chills or a fever.  You have burning pain when you urinate.  You have pain in your lower back or belly. Get help right away if:  You have severe or uncontrolled pain.  You cannot urinate. This information is not intended to replace  advice given to you by your health care provider. Make sure you discuss any questions you have with your health care provider. Document Released: 10/11/2000 Document Revised: 03/21/2016 Document Reviewed: 07/27/2015 Elsevier Interactive Patient Education  [employer].

## 2018-04-08 NOTE — Progress Notes (Signed)
Subjective:    Patient ID: Heidi Franklin, female    DOB: 1946/02/13, 72 y.o.   MRN: [id number]  HPI The patient is here for an acute visit.   Right 5th MCP jont - swollen and very tender - started two days ago gradually.  It has gotten worse.  She was using her right hand a lot over the weekend - remodeling in her bathroom.  She denies any injury.  She took a meloxicam and used pensaid and it did not help.    Pain is 9/10.  She denies numbness/tingling.  Her hand does not hurt elsewhere.     Medications and allergies reviewed with patient and updated if appropriate.  Patient Active Problem List   Diagnosis Date Noted  . Herpes simplex 02/10/2018  . ETD (eustachian tube dysfunction) 02/10/2018  . Peroneal tendinitis, right 01/06/2018  . Psoriasis 11/14/2017  . Muscle tightness 11/14/2017  . Muscle twitching 11/14/2017  . Acute bursitis of right shoulder 09/29/2017  . Bilateral hip pain 07/10/2017  . Bronchiectasis without complication ([place]) 29/79/8921  . Irritable larynx syndrome 04/25/2017  . Benign lipomatous neoplasm of skin and subcutaneous tissue of right leg 02/25/2017  . Anemia 02/05/2017  . Fatigue 01/23/2017  . Chronic cough 01/02/2017  . Multiple pulmonary nodules 11/10/2016  . Acute midline low back pain with right-sided sciatica 10/09/2016  . Neck pain 07/29/2016  . Degenerative arthritis of right knee 06/20/2016  . Degenerative arthritis of left knee 04/10/2016  . Peroneal tendinitis of left lower extremity 09/05/2015  . Recurrent falls 08/08/2015  . Bursitis of left shoulder 07/13/2015  . Morbid obesity due to excess calories ([place]) 04/16/2015  . Nonspecific abnormal electrocardiogram (ECG) (EKG) 05/11/2011  . Tenosynovitis of finger 04/22/2011  . Type 2 diabetes, uncontrolled, with neuropathy ([place]) 03/02/2010  . Hyperlipidemia associated with type 2 diabetes mellitus ([place]) 03/02/2010  . Depression with anxiety 03/02/2010  . Essential hypertension 03/02/2010    . Chronic rhinitis 03/02/2010  . Upper airway cough syndrome 03/02/2010  . GERD 03/02/2010    Current Outpatient Medications on File Prior to Visit  Medication Sig Dispense Refill  . acetaminophen (TYLENOL) 325 MG tablet Take 650 mg by mouth every 6 (six) hours as needed.    Marland Kitchen albuterol (VENTOLIN HFA) 108 (90 Base) MCG/ACT inhaler Inhale 1-2 puffs into the lungs every 6 (six) hours as needed for wheezing or shortness of breath. 1 Inhaler 3  . ALPRAZolam (XANAX) 0.5 MG tablet take 1 tablet by mouth every 8 hours if needed for anxiety 30 tablet 0  . amLODipine (NORVASC) 10 MG tablet TAKE 1 TABLET EVERY DAY 90 tablet 1  . aspirin 81 MG tablet Take 81 mg by mouth every morning.     . Calcium Citrate-Vitamin D (CALCIUM + D PO) Take 1 tablet by mouth every morning.    . chlorpheniramine (CHLOR-TRIMETON) 4 MG tablet Take 2 tabs by mouth at bedtime and every 4 hours as needed for drainage/drippy nose/sneezing    . famotidine (PEPCID) 20 MG tablet Take 1 tablet (20 mg total) by mouth at bedtime. 30 tablet 11  . fluocinonide ointment (LIDEX) 1.94 % Apply 1 application topically as needed.     . fluticasone (FLONASE) 50 MCG/ACT nasal spray USE 1 SPRAY IN EACH NOSTRIL TWICE DAILY 48 g 1  . glucose blood (ONE TOUCH ULTRA TEST) test strip 1 each by Other route daily. Use 1 strips to check blood sugar twice a day Ex E11.9 100 each 3  .  glyBURIDE (DIABETA) 2.5 MG tablet TAKE 1 TABLET EVERY DAY WITH BREAKFAST 90 tablet 1  . ibuprofen (ADVIL,MOTRIN) 600 MG tablet Take 1 tablet (600 mg total) by mouth every 6 (six) hours as needed. 30 tablet 0  . Lancets (ONETOUCH ULTRASOFT) lancets PRN 100 each 3  . losartan-hydrochlorothiazide (HYZAAR) 100-12.5 MG tablet TAKE 1 TABLET EVERY DAY 90 tablet 1  . lovastatin (MEVACOR) 20 MG tablet TAKE 1 TABLET AT BEDTIME 90 tablet 1  . meloxicam (MOBIC) 7.5 MG tablet Take 1 tablet (7.5 mg total) by mouth daily. 30 tablet 0  . mometasone (ELOCON) 0.1 % ointment     .  montelukast (SINGULAIR) 10 MG tablet Take 1 tablet (10 mg total) by mouth at bedtime. 30 tablet 5  . Multiple Vitamins-Minerals (CENTRUM SILVER PO) Take 1 tablet by mouth every morning.    Marland Kitchen omeprazole (PRILOSEC) 40 MG capsule Take 40 mg by mouth daily.    . Potassium Chloride ER 20 MEQ TBCR TAKE 1 TABLET EVERY DAY 90 tablet 3  . Respiratory Therapy Supplies (FLUTTER) DEVI Use as directed 1 each 0  . sertraline (ZOLOFT) 100 MG tablet TAKE 1 TABLET EVERY MORNING 90 tablet 1  . valACYclovir (VALTREX) 1000 MG tablet Take 2 tabs by mouth every 12 hours for one day. 12 tablet 5  . Vitamin D, Ergocalciferol, (DRISDOL) 50000 units CAPS capsule TAKE 1 CAPSULE (50,000 UNITS TOTAL) BY MOUTH EVERY 7 (SEVEN) DAYS. 12 capsule 0   No current facility-administered medications on file prior to visit.     Past Medical History:  Diagnosis Date  . ALLERGIC RHINITIS   . Allergy   . Anxiety state, unspecified   . Arthritis   . BRONCHITIS, CHRONIC   . DEPRESSION   . DIABETES MELLITUS, TYPE II   . GERD   . HYPERLIPIDEMIA   . HYPERTENSION   . Psoriasis     Past Surgical History:  Procedure Laterality Date  . ABDOMINAL HYSTERECTOMY  1980  . TRIGGER FINGER RELEASE     Index finger    Social History   Socioeconomic History  . Marital status: Married    Spouse name: Not on file  . Number of children: 0  . Years of education: 61yr colge  . Highest education level: Not on file  Occupational History  . Occupation: Retired   Scientific laboratory technician  . Financial resource strain: Somewhat hard  . Food insecurity:    Worry: Sometimes true    Inability: Sometimes true  . Transportation needs:    Medical: No    Non-medical: No  Tobacco Use  . Smoking status: Never Smoker  . Smokeless tobacco: Never Used  Substance and Sexual Activity  . Alcohol use: No    Alcohol/week: 0.0 oz  . Drug use: No  . Sexual activity: Not Currently  Lifestyle  . Physical activity:    Days per week: 0 days    Minutes per  session: 0 min  . Stress: Very much  Relationships  . Social connections:    Talks on phone: More than three times a week    Gets together: More than three times a week    Attends religious service: More than 4 times per year    Active member of club or organization: Yes    Attends meetings of clubs or organizations: More than 4 times per year    Relationship status: Married  Other Topics Concern  . Not on file  Social History Narrative   Married, lives with  spouse-retired from [employer] insurance   Not employed    Drinks coffee occasional, Consumes 1 soda a day     Family History  Problem Relation Age of Onset  . Stroke Mother   . Angina Mother   . Diabetes Father   . Hyperlipidemia Other        Parent  . Hypertension Other        Parent  . Diabetes Sister        x1  . CAD Sister   . Diabetes Brother        x 2  . Colon cancer Neg Hx   . Esophageal cancer Neg Hx   . Rectal cancer Neg Hx   . Stomach cancer Neg Hx   . Pancreatic cancer Neg Hx     Review of Systems  Constitutional: Negative for chills and fever.  Musculoskeletal: Positive for arthralgias (Right fifth MCP joint-pain and stiffness, difficulty bending) and joint swelling.  Skin: Positive for color change (Mild erythema).       Warmth of the affected joint  Neurological: Negative for numbness.       Objective:   Vitals:   04/08/18 1033  BP: 120/64  Pulse: 88  Resp: 16  Temp: 98.7 F (37.1 C)  SpO2: 98%   BP Readings from Last 3 Encounters:  04/08/18 120/64  03/26/18 118/66  02/10/18 (!) 122/56   Wt Readings from Last 3 Encounters:  04/08/18 162 lb (73.5 kg)  03/26/18 165 lb (74.8 kg)  02/10/18 164 lb (74.4 kg)   Body mass index is 31.64 kg/m.   Physical Exam  Constitutional: She appears well-developed and well-nourished. No distress.  HENT:  Head: Normocephalic and atraumatic.  Cardiovascular: Intact distal pulses.  Musculoskeletal:  Right fifth MCP with swelling, tenderness with  palpation, decreased range of motion; no other area of hand affected  Neurological: No sensory deficit (Right hand).  Skin: She is not diaphoretic. There is erythema (Right fifth MCP with mild erythema and warmth).           Assessment & Plan:    See Problem List for Assessment and Plan of chronic medical problems.

## 2018-05-12 ENCOUNTER — Other Ambulatory Visit: Payer: Self-pay | Admitting: Emergency Medicine

## 2018-05-12 MED ORDER — AMLODIPINE BESYLATE 10 MG PO TABS: 10.0000 mg | ORAL_TABLET | Freq: Every day | ORAL | 1 refills | Status: DC

## 2018-05-25 ENCOUNTER — Other Ambulatory Visit: Payer: Self-pay | Admitting: Family Medicine

## 2018-06-03 ENCOUNTER — Other Ambulatory Visit: Payer: Self-pay

## 2018-06-03 ENCOUNTER — Telehealth: Payer: Self-pay | Admitting: Internal Medicine

## 2018-06-03 MED ORDER — GLYBURIDE 2.5 MG PO TABS: ORAL_TABLET | ORAL | 0 refills | Status: DC

## 2018-06-03 NOTE — Telephone Encounter (Signed)
Copied from [place] [id number]. Topic: Quick Communication - Rx Refill/Question >> Jun 03, 2018  1:13 PM Selinda Flavin B, NT wrote: Medication: glyBURIDE (DIABETA) 2.5 MG tablet   Has the patient contacted their pharmacy? Yes.   (Agent: If no, request that the patient contact the pharmacy for the refill.) (Agent: If yes, when and what did the pharmacy advise?)  Preferred Pharmacy (with phone number or street name): [id number] - [place], [place]: Please be advised that RX refills may take up to 3 business days. We ask that you follow-up with your pharmacy.

## 2018-06-06 ENCOUNTER — Other Ambulatory Visit: Payer: Self-pay | Admitting: Internal Medicine

## 2018-06-08 NOTE — Telephone Encounter (Signed)
[place] Controlled Substance Database checked. Last filled on 02/03/18

## 2018-06-19 DIAGNOSIS — Z23 Encounter for immunization: Secondary | ICD-10-CM | POA: Diagnosis not present

## 2018-06-22 ENCOUNTER — Ambulatory Visit: Payer: Self-pay | Admitting: *Deleted

## 2018-06-22 ENCOUNTER — Ambulatory Visit (INDEPENDENT_AMBULATORY_CARE_PROVIDER_SITE_OTHER): Payer: Medicare Other | Admitting: Neurology

## 2018-06-22 ENCOUNTER — Encounter: Payer: Self-pay | Admitting: Neurology

## 2018-06-22 VITALS — BP 128/66 | HR 100 | Ht 60.0 in | Wt 169.0 lb

## 2018-06-22 DIAGNOSIS — R0683 Snoring: Secondary | ICD-10-CM

## 2018-06-22 DIAGNOSIS — R252 Cramp and spasm: Secondary | ICD-10-CM

## 2018-06-22 DIAGNOSIS — F439 Reaction to severe stress, unspecified: Secondary | ICD-10-CM

## 2018-06-22 DIAGNOSIS — G4761 Periodic limb movement disorder: Secondary | ICD-10-CM

## 2018-06-22 DIAGNOSIS — E669 Obesity, unspecified: Secondary | ICD-10-CM

## 2018-06-22 DIAGNOSIS — G479 Sleep disorder, unspecified: Secondary | ICD-10-CM | POA: Diagnosis not present

## 2018-06-22 NOTE — Telephone Encounter (Signed)
Patient calling with swelling to lower extremities for 3 days. She is at the Neurologist at this time. She will call back if needed. No triage at this time.

## 2018-06-22 NOTE — Progress Notes (Signed)
nSubjective:    Patient ID: Heidi Franklin is a 72 y.o. female.  HPI     Star Age, MD, PhD [facility] [address] [place],  [address]  Dear Dr. Quay Burow,    I saw your patient, Heidi Franklin, upon your kind request in my neurologic clinic today for initial consultation of her sleep disorder, in particular, concern for parasomnias and perhaps REM behavior disorder. The patient is accompanied by her husband today. As you know, [name] is a 72 year old right-handed woman with an underlying medical history of chronic cough, psoriasis, hip pain, anemia, neck pain, arthritis, reflux disease, and obesity, who reports twitching in her shoulder areas prior to falling asleep. She also reports that she has a tendency to move her feet while asleep. She believes that she has done this for as long as she can remember. She does not have actual restless leg symptoms typically. She does have arthritis and had recent flareup of her arthritis in her hands. She received a cortisone injection recently. After that she noticed some increase in her weight. She has also noticed some puffiness in her feet. She wonders if some of her symptoms are secondary to stress. She endorses anxiety, depression and some stress. She had counseling in the past for stress. She is on sertraline 100 mg daily. She has been on it for some years. She noticed the twitching in her shoulder areas in the past 4 months. She would not be opposed to pursuing a sleep study at this point. She has lost weight in the past few years but gained some after her cortisone injection. Her sister has sleep apnea and uses a CPAP machine. Her Epworth sleepiness score is 7 out of 24. She is retired and lives with her husband, they have no children. Her bedtime is around 11. She has some trouble going to sleep at times. She has nocturia about once per average night, rise time is between 6 and 7. She is a nonsmoker and  does not utilize alcohol or caffeine. Previously:  03/29/2015: 72 year old right-handed woman with an underlying medical history of diabetes, chronic cough followed by Dr. Melvyn Novas in pulmonology, and obesity, who reports a 4-5 month history of nightmares and anxiety at night. She has woken up remembering a bad dream. She makes moaning sounds in her sleep per husband. She does not make any flailing movements in her sleep but he has noted that she often seems to stiffen up and become jittery while making moaning sounds. She has not had any convulsive symptoms in her sleep. She doesn't endorse some restless leg symptoms and he endorses that she is very twitchy with her legs in her sleep. She has nocturia on average 3 times per night. She tries to be in better early winding down. She likes to watch TV or use her tablet while in bed. She is usually in bed between 8 and 9 PM. She may fall asleep around 10 PM. Sometimes she takes a half of Xanax at night. She no longer takes gabapentin which was prescribed for her cough. She also occasionally takes Hycodan for cough suppression but not every day. She does endorse some anxiety, stress and depressive symptoms. She has been on 50 mg of sertraline daily. She wonders if stress is a contributor to her symptoms. She does not have any evidence of sleep walking. She snores which is usually mild per husband. He has noted the occasional breathing pauses while she is asleep.  She denies morning headaches. She wakes up around 9 and does not typically feel rested first thing in the morning. Her Epworth sleepiness score is 9 out of 24 today and her fatigue score is 36 out of 63. Her sister has obstructive sleep apnea and uses a CPAP machine. She is familiar with the diagnoses as her husband also has sleep apnea and uses a CPAP machine.  Her Past Medical History Is Significant For: Past Medical History:  Diagnosis Date  . ALLERGIC RHINITIS   . Allergy   . Anxiety state, unspecified    . Arthritis   . BRONCHITIS, CHRONIC   . DEPRESSION   . DIABETES MELLITUS, TYPE II   . GERD   . HYPERLIPIDEMIA   . HYPERTENSION   . Psoriasis     Her Past Surgical History Is Significant For: Past Surgical History:  Procedure Laterality Date  . ABDOMINAL HYSTERECTOMY  1980  . TRIGGER FINGER RELEASE     Index finger    Her Family History Is Significant For: Family History  Problem Relation Age of Onset  . Stroke Mother   . Angina Mother   . Diabetes Father   . Hyperlipidemia Other        Parent  . Hypertension Other        Parent  . Diabetes Sister        x1  . CAD Sister   . Diabetes Brother        x 2  . Colon cancer Neg Hx   . Esophageal cancer Neg Hx   . Rectal cancer Neg Hx   . Stomach cancer Neg Hx   . Pancreatic cancer Neg Hx     Her Social History Is Significant For: Social History   Socioeconomic History  . Marital status: Married    Spouse name: Not on file  . Number of children: 0  . Years of education: 30yr colge  . Highest education level: Not on file  Occupational History  . Occupation: Retired   Scientific laboratory technician  . Financial resource strain: Somewhat hard  . Food insecurity:    Worry: Sometimes true    Inability: Sometimes true  . Transportation needs:    Medical: No    Non-medical: No  Tobacco Use  . Smoking status: Never Smoker  . Smokeless tobacco: Never Used  Substance and Sexual Activity  . Alcohol use: No    Alcohol/week: 0.0 standard drinks  . Drug use: No  . Sexual activity: Not Currently  Lifestyle  . Physical activity:    Days per week: 0 days    Minutes per session: 0 min  . Stress: Very much  Relationships  . Social connections:    Talks on phone: More than three times a week    Gets together: More than three times a week    Attends religious service: More than 4 times per year    Active member of club or organization: Yes    Attends meetings of clubs or organizations: More than 4 times per year    Relationship  status: Married  Other Topics Concern  . Not on file  Social History Narrative   Married, lives with spouse-retired from [employer] insurance   Not employed    Drinks coffee occasional, Consumes 1 soda a day     Her Allergies Are:  Allergies  Allergen Reactions  . Metformin And Related Diarrhea  . Penicillins Rash  :   Her Current Medications Are:  Outpatient Encounter Medications  as of 06/22/2018  Medication Sig  . acetaminophen (TYLENOL) 325 MG tablet Take 650 mg by mouth every 6 (six) hours as needed.  Marland Kitchen albuterol (VENTOLIN HFA) 108 (90 Base) MCG/ACT inhaler Inhale 1-2 puffs into the lungs every 6 (six) hours as needed for wheezing or shortness of breath.  . ALPRAZolam (XANAX) 0.5 MG tablet TAKE 1 TABLET BY MOUTH EVERY 8 HOURS IF NEEDED FOR ANXIETY  . amLODipine (NORVASC) 10 MG tablet Take 1 tablet (10 mg total) by mouth daily.  Marland Kitchen aspirin 81 MG tablet Take 81 mg by mouth every morning.   . Calcium Citrate-Vitamin D (CALCIUM + D PO) Take 1 tablet by mouth every morning.  . chlorpheniramine (CHLOR-TRIMETON) 4 MG tablet Take 2 tabs by mouth at bedtime and every 4 hours as needed for drainage/drippy nose/sneezing  . famotidine (PEPCID) 20 MG tablet Take 1 tablet (20 mg total) by mouth at bedtime.  . fluocinonide ointment (LIDEX) 1.54 % Apply 1 application topically as needed.   . fluticasone (FLONASE) 50 MCG/ACT nasal spray USE 1 SPRAY IN EACH NOSTRIL TWICE DAILY  . glucose blood (ONE TOUCH ULTRA TEST) test strip 1 each by Other route daily. Use 1 strips to check blood sugar twice a day Ex E11.9  . glyBURIDE (DIABETA) 2.5 MG tablet TAKE 1 TABLET EVERY DAY WITH BREAKFAST  . ibuprofen (ADVIL,MOTRIN) 600 MG tablet Take 1 tablet (600 mg total) by mouth every 6 (six) hours as needed.  . Lancets (ONETOUCH ULTRASOFT) lancets PRN  . losartan-hydrochlorothiazide (HYZAAR) 100-12.5 MG tablet TAKE 1 TABLET EVERY DAY  . lovastatin (MEVACOR) 20 MG tablet TAKE 1 TABLET AT BEDTIME  . meloxicam (MOBIC)  7.5 MG tablet Take 1 tablet (7.5 mg total) by mouth daily.  . mometasone (ELOCON) 0.1 % ointment   . montelukast (SINGULAIR) 10 MG tablet Take 1 tablet (10 mg total) by mouth at bedtime.  . Multiple Vitamins-Minerals (CENTRUM SILVER PO) Take 1 tablet by mouth every morning.  Marland Kitchen omeprazole (PRILOSEC) 40 MG capsule Take 40 mg by mouth daily.  . Potassium Chloride ER 20 MEQ TBCR TAKE 1 TABLET EVERY DAY  . Respiratory Therapy Supplies (FLUTTER) DEVI Use as directed  . sertraline (ZOLOFT) 100 MG tablet TAKE 1 TABLET EVERY MORNING  . valACYclovir (VALTREX) 1000 MG tablet Take 2 tabs by mouth every 12 hours for one day.  . Vitamin D, Ergocalciferol, (DRISDOL) 50000 units CAPS capsule TAKE 1 CAPSULE (50,000 UNITS TOTAL) BY MOUTH EVERY 7 (SEVEN) DAYS.  . [DISCONTINUED] predniSONE (DELTASONE) 10 MG tablet Take 4 tabs po qd x 3 days, then 3 tabs po qd x 3 days, then 2 tabs po qd x 3 days, then 1 tab po qd x 3 days   No facility-administered encounter medications on file as of 06/22/2018.   :  Review of Systems:  Out of a complete 14 point review of systems, all are reviewed and negative with the exception of these symptoms as listed below: Review of Systems  Neurological:       Pt presents today to discuss the jerking movements in her shoulders when she lies down to go to sleep. Pt did not want to complete her sleep study as ordered last visit.    Objective:  Neurological Exam  Physical Exam Physical Examination:   Vitals:   06/22/18 1006  BP: 128/66  Pulse: 100   General Examination: The patient is a very pleasant 72 y.o. female in no acute distress. She appears well-developed and well-nourished and well groomed.  HEENT: Normocephalic, atraumatic, pupils are equal, round and reactive to light and accommodation. Extraocular tracking is good without limitation to gaze excursion or nystagmus noted. Normal smooth pursuit is noted. Hearing is grossly intact. Face is symmetric with normal facial  animation and normal facial sensation. Speech is clear with no dysarthria noted. There is no hypophonia. There is no lip, neck/head, jaw or voice tremor. Neck is supple with full range of passive and active motion. There are no carotid bruits on auscultation. Oropharynx exam reveals: mild mouth dryness, adequate dental hygiene and moderate airway crowding, due to narrow airway entry, tonsillar size of 1-2+ bilaterally, and longer uvula. Mallampati is class II. Tongue protrudes centrally and palate elevates symmetrically. Neck size is 14 7/8 inches. She has a Mild overbite.  Chest: Clear to auscultation without wheezing, rhonchi or crackles noted.  Heart: S1+S2+0, regular and normal without murmurs, rubs or gallops noted.   Abdomen: Soft, non-tender and non-distended with normal bowel sounds appreciated on auscultation.  Extremities: There is 1+ pitting edema in the distal lower extremity on the R, trace on the L.   Skin: Warm and dry without trophic changes noted. There are no varicose veins.  Musculoskeletal: exam revealsarthritic changes in both hands, right fingers more than left.  Neurologically:  Mental status: The patient is awake, alert and oriented in all 4 spheres. Her immediate and remote memory, attention, language skills and fund of knowledge are appropriate. There is no evidence of aphasia, agnosia, apraxia or anomia. Speech is clear with normal prosody and enunciation. Thought process is linear. Mood is normal and affect is normal.  Cranial nerves II - XII are as described above under HEENT exam. In addition: shoulder shrug is normal with equal shoulder height noted. Motor exam: Normal bulk, strength and tone is noted. There is no drift, tremor or rebound. Romberg is negative. Reflexes are 1-2+ throughout. Babinski: Toes are flexor bilaterally. Fine motor skills and coordination: grossly intact.  Cerebellar testing: No dysmetria or intention tremor on finger to nose testing.  Heel to shin is difficult for her, secondary to body habitus. There is no truncal or gait ataxia.  Sensory exam: intact to light touch in the upper and lower extremities.  Gait, station and balance: She stands easily. No veering to one side is noted. No leaning to one side is noted. Posture is age-appropriate and stance is narrow based. Gait shows normal stride length and normal pace. No problems turning are noted.   Assessment and Plan:   In summary, Sanna Porcaro is a very pleasant 72 year old female with an underlying medical history of chronic cough, psoriasis, hip pain, anemia, neck pain, arthritis, reflux disease, and obesity, who presents for evaluation of jerking movements at night. She has involuntary movements in her legs while asleep and also reports in the past 4 months involuntary twitching before falling asleep affecting her shoulder areas bilaterally. She wonders if stress could be at play. She is advised that certain medications can increase the risk for twitching at night especially SSRI type antidepressants. She is on sertraline. She is encouraged to talk to about stress management and potential counseling for this as well. I did suggest we proceed with a sleep study to further delineate her sleep disturbance. She may be at risk for obstructive sleep apnea as well what with her family history, history of obesity, and crowded looking airway. I recommended the following at this time: sleep study with potential positive airway pressure titration. (We will score hypopneas at 4%).  I explained the sleep test procedure to the patient and also outlined possible surgical and non-surgical treatment options of OSA. I also explained the CPAP treatment option to the patient, who indicated that she would be willing to try CPAP if the need arises. I explained the importance of being compliant with PAP treatment, not only for insurance purposes but primarily to improve Her symptoms, and for the patient's  long term health benefit, including to reduce Her cardiovascular risks. I answered all her questions today and the patient was in agreement. I would like to see her back after the sleep study is completed and encouraged her to call with any interim questions, concerns, problems or updates.   Thank you very much for allowing me to participate in the care of this nice patient. If I can be of any further assistance to you please do not hesitate to call me at [phone].  Sincerely,

## 2018-06-22 NOTE — Telephone Encounter (Signed)
Pt reports swelling both legs and hands x 4-5 days. States edema mild left leg, moderate right; "Below knees down, > feet and ankles." States right hand more swollen that left, mild- moderate.  Denies any redness, warmth, pain, SOB. Afebrile. Pt was just seen at neurologist office this am; states neurologist assessed, told her to "Elevate legs and stay away from sodas and salt." Pt states "I may need a water pill." Appt made with Dr. Quay Burow for tomorrow am. CAre advise given per protocol. Reason for Disposition . [1] MODERATE leg swelling (e.g., swelling extends up to knees) AND [2] new onset or worsening    Hands swollen also  Answer Assessment - Initial Assessment Questions 1. ONSET: "When did the swelling start?" (e.g., minutes, hours, days)     4-5 days ago 2. LOCATION: "What part of the leg is swollen?"  "Are both legs swollen or just one leg?"     Both legs  Right > left 3. SEVERITY: "How bad is the swelling?" (e.g., localized; mild, moderate, severe)  - Localized - small area of swelling localized to one leg  - MILD pedal edema - swelling limited to foot and ankle, pitting edema < 1/4 inch (6 mm) deep, rest and elevation eliminate most or all swelling  - MODERATE edema - swelling of lower leg to knee, pitting edema > 1/4 inch (6 mm) deep, rest and elevation only partially reduce swelling  - SEVERE edema - swelling extends above knee, facial or hand swelling present      Mild in left, moderate in right 4. REDNESS: "Does the swelling look red or infected?"     no 5. PAIN: "Is the swelling painful to touch?" If so, ask: "How painful is it?"   (Scale 1-10; mild, moderate or severe)     no 6. FEVER: "Do you have a fever?" If so, ask: "What is it, how was it measured, and when did it start?"      no 7. CAUSE: "What do you think is causing the leg swelling?"     "fluid" 8. MEDICAL HISTORY: "Do you have a history of heart failure, kidney disease, liver failure, or cancer?"      9. RECURRENT  SYMPTOM: "Have you had leg swelling before?" If so, ask: "When was the last time?" "What happened that time?"     no 10. OTHER SYMPTOMS: "Do you have any other symptoms?" (e.g., chest pain, difficulty breathing)       Hands swollen "Mild" right greater than left  Protocols used: LEG SWELLING AND EDEMA-A-AH

## 2018-06-22 NOTE — Patient Instructions (Addendum)
You may have a condition called periodic leg movement disorder (PLMD). While this is typically associated with symptoms of restless leg syndrome, patients can have periodic leg movements, that is, repetitive twitching in their sleep secondary to medication effects, particularly from taking an SSRI type antidepressant. Sometimes changing the antidepressant regimen can help. If this leg movement disorder disrupts your sleep, it can cause other problems such as daytime sleepiness. Please do not drive if you feels sleepy. Sometimes other conditions can cause leg twitching at night including thyroid dysfunction and iron deficiency. There are some symptomatic medications available for treatment. At this point, we should proceed with a sleep study to further delineate your sleep-related twitching.

## 2018-06-23 ENCOUNTER — Encounter: Payer: Self-pay | Admitting: Internal Medicine

## 2018-06-23 ENCOUNTER — Ambulatory Visit (INDEPENDENT_AMBULATORY_CARE_PROVIDER_SITE_OTHER): Payer: Medicare Other | Admitting: Internal Medicine

## 2018-06-23 VITALS — BP 130/62 | HR 92 | Temp 100.2°F | Resp 16 | Ht 60.0 in | Wt 165.0 lb

## 2018-06-23 DIAGNOSIS — R609 Edema, unspecified: Secondary | ICD-10-CM

## 2018-06-23 DIAGNOSIS — S46811A Strain of other muscles, fascia and tendons at shoulder and upper arm level, right arm, initial encounter: Secondary | ICD-10-CM | POA: Diagnosis not present

## 2018-06-23 MED ORDER — TIZANIDINE HCL 2 MG PO TABS: 2.0000 mg | ORAL_TABLET | Freq: Four times a day (QID) | ORAL | 0 refills | Status: DC | PRN

## 2018-06-23 NOTE — Assessment & Plan Note (Signed)
Muscle strain Right side of neck Continue Tylenol, heat, topical BenGay We will try muscle relaxer-tizanidine 2 mg every 6 hours as needed Call if no improvement-can consider PT

## 2018-06-23 NOTE — Progress Notes (Signed)
Subjective:    Patient ID: Heidi Franklin, female    DOB: 02/18/1946, 72 y.o.   MRN: [id number]  HPI The patient is here for an acute visit.   Edema of legs and hands x 4-5 days:  The swelling started in her right foot and ankle a few days ago.  She had difficulty getting her shoe on.  She has some in the left foot and ankle.  She propped up her feet and that helped.  Her hands were also a little swollen as well, the right more than the left.  The swelling is better - her right hand is a little swollen.  She thinks there may have been increase in salt intake.  She typically is good at keeping the salt level down.    Neck pain:  It started yesterday and it got worse.  She had difficulty sleeping last night. She has stiffness and tightness on the right side of her neck.  She took tylenol and it helped a little.  Heating pad did not help much.  She denies pain radiating into her arms or numbness/tingling in the arms.    Medications and allergies reviewed with patient and updated if appropriate.  Patient Active Problem List   Diagnosis Date Noted  . Acute gout 04/08/2018  . Herpes simplex 02/10/2018  . ETD (eustachian tube dysfunction) 02/10/2018  . Peroneal tendinitis, right 01/06/2018  . Psoriasis 11/14/2017  . Muscle tightness 11/14/2017  . Muscle twitching 11/14/2017  . Acute bursitis of right shoulder 09/29/2017  . Bilateral hip pain 07/10/2017  . Bronchiectasis without complication ([place]) 56/81/2751  . Irritable larynx syndrome 04/25/2017  . Benign lipomatous neoplasm of skin and subcutaneous tissue of right leg 02/25/2017  . Anemia 02/05/2017  . Fatigue 01/23/2017  . Chronic cough 01/02/2017  . Multiple pulmonary nodules 11/10/2016  . Acute midline low back pain with right-sided sciatica 10/09/2016  . Neck pain 07/29/2016  . Degenerative arthritis of right knee 06/20/2016  . Degenerative arthritis of left knee 04/10/2016  . Peroneal tendinitis of left lower extremity 09/05/2015   . Recurrent falls 08/08/2015  . Bursitis of left shoulder 07/13/2015  . Morbid obesity due to excess calories ([place]) 04/16/2015  . Nonspecific abnormal electrocardiogram (ECG) (EKG) 05/11/2011  . Tenosynovitis of finger 04/22/2011  . Type 2 diabetes, uncontrolled, with neuropathy ([place]) 03/02/2010  . Hyperlipidemia associated with type 2 diabetes mellitus ([place]) 03/02/2010  . Depression with anxiety 03/02/2010  . Essential hypertension 03/02/2010  . Chronic rhinitis 03/02/2010  . Upper airway cough syndrome 03/02/2010  . GERD 03/02/2010    Current Outpatient Medications on File Prior to Visit  Medication Sig Dispense Refill  . acetaminophen (TYLENOL) 325 MG tablet Take 650 mg by mouth every 6 (six) hours as needed.    Marland Kitchen albuterol (VENTOLIN HFA) 108 (90 Base) MCG/ACT inhaler Inhale 1-2 puffs into the lungs every 6 (six) hours as needed for wheezing or shortness of breath. 1 Inhaler 3  . ALPRAZolam (XANAX) 0.5 MG tablet TAKE 1 TABLET BY MOUTH EVERY 8 HOURS IF NEEDED FOR ANXIETY 30 tablet 0  . amLODipine (NORVASC) 10 MG tablet Take 1 tablet (10 mg total) by mouth daily. 90 tablet 1  . aspirin 81 MG tablet Take 81 mg by mouth every morning.     . Calcium Citrate-Vitamin D (CALCIUM + D PO) Take 1 tablet by mouth every morning.    . chlorpheniramine (CHLOR-TRIMETON) 4 MG tablet Take 2 tabs by mouth at bedtime and every 4  hours as needed for drainage/drippy nose/sneezing    . famotidine (PEPCID) 20 MG tablet Take 1 tablet (20 mg total) by mouth at bedtime. 30 tablet 11  . fluocinonide ointment (LIDEX) 4.43 % Apply 1 application topically as needed.     . fluticasone (FLONASE) 50 MCG/ACT nasal spray USE 1 SPRAY IN EACH NOSTRIL TWICE DAILY 48 g 1  . glucose blood (ONE TOUCH ULTRA TEST) test strip 1 each by Other route daily. Use 1 strips to check blood sugar twice a day Ex E11.9 100 each 3  . glyBURIDE (DIABETA) 2.5 MG tablet TAKE 1 TABLET EVERY DAY WITH BREAKFAST 90 tablet 0  . ibuprofen  (ADVIL,MOTRIN) 600 MG tablet Take 1 tablet (600 mg total) by mouth every 6 (six) hours as needed. 30 tablet 0  . Lancets (ONETOUCH ULTRASOFT) lancets PRN 100 each 3  . losartan-hydrochlorothiazide (HYZAAR) 100-12.5 MG tablet TAKE 1 TABLET EVERY DAY 90 tablet 1  . lovastatin (MEVACOR) 20 MG tablet TAKE 1 TABLET AT BEDTIME 90 tablet 1  . meloxicam (MOBIC) 7.5 MG tablet Take 1 tablet (7.5 mg total) by mouth daily. 30 tablet 0  . mometasone (ELOCON) 0.1 % ointment     . montelukast (SINGULAIR) 10 MG tablet Take 1 tablet (10 mg total) by mouth at bedtime. 30 tablet 5  . Multiple Vitamins-Minerals (CENTRUM SILVER PO) Take 1 tablet by mouth every morning.    Marland Kitchen omeprazole (PRILOSEC) 40 MG capsule Take 40 mg by mouth daily.    . Potassium Chloride ER 20 MEQ TBCR TAKE 1 TABLET EVERY DAY 90 tablet 3  . Respiratory Therapy Supplies (FLUTTER) DEVI Use as directed 1 each 0  . sertraline (ZOLOFT) 100 MG tablet TAKE 1 TABLET EVERY MORNING 90 tablet 1  . valACYclovir (VALTREX) 1000 MG tablet Take 2 tabs by mouth every 12 hours for one day. 12 tablet 5  . Vitamin D, Ergocalciferol, (DRISDOL) 50000 units CAPS capsule TAKE 1 CAPSULE (50,000 UNITS TOTAL) BY MOUTH EVERY 7 (SEVEN) DAYS. 12 capsule 0   No current facility-administered medications on file prior to visit.     Past Medical History:  Diagnosis Date  . ALLERGIC RHINITIS   . Allergy   . Anxiety state, unspecified   . Arthritis   . BRONCHITIS, CHRONIC   . DEPRESSION   . DIABETES MELLITUS, TYPE II   . GERD   . HYPERLIPIDEMIA   . HYPERTENSION   . Psoriasis     Past Surgical History:  Procedure Laterality Date  . ABDOMINAL HYSTERECTOMY  1980  . TRIGGER FINGER RELEASE     Index finger    Social History   Socioeconomic History  . Marital status: Married    Spouse name: Not on file  . Number of children: 0  . Years of education: 66yr colge  . Highest education level: Not on file  Occupational History  . Occupation: Retired   Scientific laboratory technician   . Financial resource strain: Somewhat hard  . Food insecurity:    Worry: Sometimes true    Inability: Sometimes true  . Transportation needs:    Medical: No    Non-medical: No  Tobacco Use  . Smoking status: Never Smoker  . Smokeless tobacco: Never Used  Substance and Sexual Activity  . Alcohol use: No    Alcohol/week: 0.0 standard drinks  . Drug use: No  . Sexual activity: Not Currently  Lifestyle  . Physical activity:    Days per week: 0 days    Minutes per session:  0 min  . Stress: Very much  Relationships  . Social connections:    Talks on phone: More than three times a week    Gets together: More than three times a week    Attends religious service: More than 4 times per year    Active member of club or organization: Yes    Attends meetings of clubs or organizations: More than 4 times per year    Relationship status: Married  Other Topics Concern  . Not on file  Social History Narrative   Married, lives with spouse-retired from [employer] insurance   Not employed    Drinks coffee occasional, Consumes 1 soda a day     Family History  Problem Relation Age of Onset  . Stroke Mother   . Angina Mother   . Diabetes Father   . Hyperlipidemia Other        Parent  . Hypertension Other        Parent  . Diabetes Sister        x1  . CAD Sister   . Diabetes Brother        x 2  . Colon cancer Neg Hx   . Esophageal cancer Neg Hx   . Rectal cancer Neg Hx   . Stomach cancer Neg Hx   . Pancreatic cancer Neg Hx     Review of Systems  Respiratory: Positive for cough (occ, allergies) and shortness of breath (chronic). Negative for wheezing.   Cardiovascular: Positive for chest pain (light pains one night at rest) and leg swelling. Negative for palpitations.  Gastrointestinal:       Lots of gerd  Musculoskeletal: Positive for neck pain.       Objective:   Vitals:   06/23/18 1017  BP: 130/62  Pulse: 92  Resp: 16  Temp: 100.2 F (37.9 C)  SpO2: 98%   BP Readings  from Last 3 Encounters:  06/23/18 130/62  06/22/18 128/66  04/08/18 120/64   Wt Readings from Last 3 Encounters:  06/23/18 165 lb (74.8 kg)  06/22/18 169 lb (76.7 kg)  04/08/18 162 lb (73.5 kg)   Body mass index is 32.22 kg/m.   Physical Exam  Constitutional: She appears well-developed and well-nourished. No distress.  HENT:  Head: Normocephalic and atraumatic.  Neck: Neck supple. No JVD present. No thyromegaly present.  Cardiovascular: Normal rate, regular rhythm and normal heart sounds.  Pulmonary/Chest: Effort normal and breath sounds normal. No respiratory distress. She has no wheezes. She has no rales.  Musculoskeletal: She exhibits edema (No hand edema, trace lower extremity edema bilaterally).  Tenderness left trapezius from base of skull to mid upper back, no C-spine tenderness; increased pain in neck with movement of head  Lymphadenopathy:    She has no cervical adenopathy.  Neurological:  Normal sensation and strength bilateral upper extremities  Skin: She is not diaphoretic.           Assessment & Plan:    See Problem List for Assessment and Plan of chronic medical problems.

## 2018-06-23 NOTE — Assessment & Plan Note (Signed)
Started experiencing bilateral lower extremity and hand edema-right side worse than left side Likely related to increased sodium intake No significant edema on exam Stressed low-sodium diet Elevate legs when sitting Continue current medications

## 2018-06-23 NOTE — Patient Instructions (Signed)
For your neck continue heat, bengay, tylenol.  A muscle relaxer was sent to your pharmacy.    If there is no improvement we can consider PT.     Avoid salt.  This will help prevent swelling.  Continue your current medications.  Elevate your legs when you sit.

## 2018-06-24 ENCOUNTER — Encounter: Payer: Self-pay | Admitting: Internal Medicine

## 2018-07-01 ENCOUNTER — Encounter: Payer: Self-pay | Admitting: Family Medicine

## 2018-07-01 ENCOUNTER — Ambulatory Visit (INDEPENDENT_AMBULATORY_CARE_PROVIDER_SITE_OTHER): Payer: Medicare Other | Admitting: Family Medicine

## 2018-07-01 ENCOUNTER — Ambulatory Visit (INDEPENDENT_AMBULATORY_CARE_PROVIDER_SITE_OTHER)
Admission: RE | Admit: 2018-07-01 | Discharge: 2018-07-01 | Disposition: A | Discharge status: Home / Self Care | Payer: Medicare Other | Source: Ambulatory Visit | Attending: Family Medicine | Admitting: Family Medicine

## 2018-07-01 VITALS — BP 146/74 | HR 92 | Ht 60.0 in | Wt 164.0 lb

## 2018-07-01 DIAGNOSIS — M542 Cervicalgia: Secondary | ICD-10-CM | POA: Diagnosis not present

## 2018-07-01 DIAGNOSIS — J020 Streptococcal pharyngitis: Secondary | ICD-10-CM

## 2018-07-01 MED ORDER — DOXYCYCLINE HYCLATE 100 MG PO TABS: 100.0000 mg | ORAL_TABLET | Freq: Two times a day (BID) | ORAL | 0 refills | Status: AC

## 2018-07-01 NOTE — Patient Instructions (Addendum)
Good to see you  Xray downstairs Doxycycline 2 times a day for 10 days  Meloxicam daily for 10 days as well  If not better we will need labs.  See me or Dr. Quay Burow again in the next 2 weeks

## 2018-07-01 NOTE — Progress Notes (Signed)
[name] [address] [place], [place] [address] Phone: [phone] Subjective:   Fontaine No, am serving as a scribe for Dr. Hulan Saas.   CC: Neck pain follow-up  [id number]  Heidi Franklin is a 72 y.o. female coming in with complaint of right sided neck pain for the past 2 weeks. Patient was given muscle relaxer from Dr. Quay Burow which is not helping. Pain is now radiating from both ears down into her scapula. She has a hard time sleeping due to to pain. Has tried stretching.   Patient notes that her sugar was 458 and 475 the past 2 days.  Has not been feeling good overall.  Has had some discomfort and pain recently in the neck for quite some time.  Patient feels like she is just not well.     Past Medical History:  Diagnosis Date  . ALLERGIC RHINITIS   . Allergy   . Anxiety state, unspecified   . Arthritis   . BRONCHITIS, CHRONIC   . DEPRESSION   . DIABETES MELLITUS, TYPE II   . GERD   . HYPERLIPIDEMIA   . HYPERTENSION   . Psoriasis    Past Surgical History:  Procedure Laterality Date  . ABDOMINAL HYSTERECTOMY  1980  . TRIGGER FINGER RELEASE     Index finger   Social History   Socioeconomic History  . Marital status: Married    Spouse name: Not on file  . Number of children: 0  . Years of education: 72yr colge  . Highest education level: Not on file  Occupational History  . Occupation: Retired   Scientific laboratory technician  . Financial resource strain: Somewhat hard  . Food insecurity:    Worry: Sometimes true    Inability: Sometimes true  . Transportation needs:    Medical: No    Non-medical: No  Tobacco Use  . Smoking status: Never Smoker  . Smokeless tobacco: Never Used  Substance and Sexual Activity  . Alcohol use: No    Alcohol/week: 0.0 standard drinks  . Drug use: No  . Sexual activity: Not Currently  Lifestyle  . Physical activity:    Days per week: 0 days    Minutes per session: 0 min  . Stress: Very much    Relationships  . Social connections:    Talks on phone: More than three times a week    Gets together: More than three times a week    Attends religious service: More than 4 times per year    Active member of club or organization: Yes    Attends meetings of clubs or organizations: More than 4 times per year    Relationship status: Married  Other Topics Concern  . Not on file  Social History Narrative   Married, lives with spouse-retired from [employer] insurance   Not employed    Drinks coffee occasional, Consumes 1 soda a day    Allergies  Allergen Reactions  . Metformin And Related Diarrhea  . Penicillins Rash   Family History  Problem Relation Age of Onset  . Stroke Mother   . Angina Mother   . Diabetes Father   . Hyperlipidemia Other        Parent  . Hypertension Other        Parent  . Diabetes Sister        x1  . CAD Sister   . Diabetes Brother        x 2  . Colon  cancer Neg Hx   . Esophageal cancer Neg Hx   . Rectal cancer Neg Hx   . Stomach cancer Neg Hx   . Pancreatic cancer Neg Hx     Current Outpatient Medications (Endocrine & Metabolic):  .  glyBURIDE (DIABETA) 2.5 MG tablet, TAKE 1 TABLET EVERY DAY WITH BREAKFAST  Current Outpatient Medications (Cardiovascular):  .  amLODipine (NORVASC) 10 MG tablet, Take 1 tablet (10 mg total) by mouth daily. Marland Kitchen  losartan-hydrochlorothiazide (HYZAAR) 100-12.5 MG tablet, TAKE 1 TABLET EVERY DAY .  lovastatin (MEVACOR) 20 MG tablet, TAKE 1 TABLET AT BEDTIME  Current Outpatient Medications (Respiratory):  .  albuterol (VENTOLIN HFA) 108 (90 Base) MCG/ACT inhaler, Inhale 1-2 puffs into the lungs every 6 (six) hours as needed for wheezing or shortness of breath. .  chlorpheniramine (CHLOR-TRIMETON) 4 MG tablet, Take 2 tabs by mouth at bedtime and every 4 hours as needed for drainage/drippy nose/sneezing .  fluticasone (FLONASE) 50 MCG/ACT nasal spray, USE 1 SPRAY IN EACH NOSTRIL TWICE DAILY .  montelukast (SINGULAIR) 10 MG  tablet, Take 1 tablet (10 mg total) by mouth at bedtime.  Current Outpatient Medications (Analgesics):  .  acetaminophen (TYLENOL) 325 MG tablet, Take 650 mg by mouth every 6 (six) hours as needed. Marland Kitchen  aspirin 81 MG tablet, Take 81 mg by mouth every morning.  .  meloxicam (MOBIC) 7.5 MG tablet, Take 1 tablet (7.5 mg total) by mouth daily.   Current Outpatient Medications (Other):  Marland Kitchen  ALPRAZolam (XANAX) 0.5 MG tablet, TAKE 1 TABLET BY MOUTH EVERY 8 HOURS IF NEEDED FOR ANXIETY .  Calcium Citrate-Vitamin D (CALCIUM + D PO), Take 1 tablet by mouth every morning. .  famotidine (PEPCID) 20 MG tablet, Take 1 tablet (20 mg total) by mouth at bedtime. .  fluocinonide ointment (LIDEX) 4.94 %, Apply 1 application topically as needed.  Marland Kitchen  glucose blood (ONE TOUCH ULTRA TEST) test strip, 1 each by Other route daily. Use 1 strips to check blood sugar twice a day Ex E11.9 .  Lancets (ONETOUCH ULTRASOFT) lancets, PRN .  mometasone (ELOCON) 0.1 % ointment,  .  Multiple Vitamins-Minerals (CENTRUM SILVER PO), Take 1 tablet by mouth every morning. Marland Kitchen  omeprazole (PRILOSEC) 40 MG capsule, Take 40 mg by mouth daily. .  Potassium Chloride ER 20 MEQ TBCR, TAKE 1 TABLET EVERY DAY .  Respiratory Therapy Supplies (FLUTTER) DEVI, Use as directed .  sertraline (ZOLOFT) 100 MG tablet, TAKE 1 TABLET EVERY MORNING .  tiZANidine (ZANAFLEX) 2 MG tablet, Take 1 tablet (2 mg total) by mouth every 6 (six) hours as needed for muscle spasms. .  valACYclovir (VALTREX) 1000 MG tablet, Take 2 tabs by mouth every 12 hours for one day. .  Vitamin D, Ergocalciferol, (DRISDOL) 50000 units CAPS capsule, TAKE 1 CAPSULE (50,000 UNITS TOTAL) BY MOUTH EVERY 7 (SEVEN) DAYS. Marland Kitchen  doxycycline (VIBRA-TABS) 100 MG tablet, Take 1 tablet (100 mg total) by mouth 2 (two) times daily for 10 days.    Past medical history, social, surgical and family history all reviewed in electronic medical record.  No pertanent information unless stated regarding to  the chief complaint.   Review of Systems:  No headache, visual changes, nausea, vomiting, diarrhea, constipation, dizziness, abdominal pain, skin rash, fevers, chills, night sweats, weight loss, swollen lymph nodes,  chest pain, shortness of breath, mood changes.  Positive muscle aches, joint swelling, body aches  Objective  Blood pressure (!) 146/74, pulse 92, height 5' (1.524 m), weight 164 lb (  74.4 kg), SpO2 98 %.    General: No apparent distress alert and oriented x3 mood and affect normal, dressed appropriately.  HEENT: Pupils equal, extraocular movements intact patient's throat though does show no significant white patches on the posterior pharynx.  Significant erythema also noted surrounding the area. Respiratory: Patient's speak in full sentences and does not appear short of breath  Cardiovascular: No lower extremity edema, non tender, no erythema  Skin: Warm dry intact with no signs of infection or rash on extremities or on axial skeleton.  Abdomen: Soft nontender  Neuro: Cranial nerves II through XII are intact, neurovascularly intact in all extremities with 2+ DTRs and 2+ pulses.  Lymph: Significant cervical lymphadenopathy bilaterally noted in anterior and posterior Gait normal with good balance and coordination.  MSK:  tender with full range of motion and good stability and symmetric strength and tone of shoulders, elbows, wrist, hip, knee and ankles bilaterally.  Neck exam shows significant tightness in all planes.  Patient looking forward flexion with negative meningeal signs.  Patient does have some mild limitation in extension.   Impression and Recommendations:     This case required medical decision making of moderate complexity. The above documentation has been reviewed and is accurate and complete Lyndal Pulley, DO       Note: This dictation was prepared with Dragon dictation along with smaller phrase technology. Any transcriptional errors that result from this  process are unintentional.

## 2018-07-01 NOTE — Assessment & Plan Note (Signed)
I am concerned the patient has more of a strep throat.  Patient does have some lymphadenopathy and does have signs consistent with strep throat.  No fever at the moment but patient states that she has had a 99 degree fever at home.  Patient does have some elevation in the blood sugars.  Patient given doxycycline with patient having an allergy to penicillin.  We discussed that if any fevers or chills seems to worsen or any respiratory illnesses to seek medical attention immediately.  Patient is to follow-up with primary care provider in the next 10 to 14 days

## 2018-07-03 ENCOUNTER — Telehealth: Payer: Self-pay | Admitting: Internal Medicine

## 2018-07-03 NOTE — Telephone Encounter (Addendum)
Copied from [place] [id number]. Topic: General - Other >> Jul 03, 2018 10:16 AM Lennox Solders wrote: Reason for CRM: pt is calling to report some bs reading on 06-30-18 BS 458 that  morning and that evening 175 on 07-01-18 BS 475 morning,  07-02-18 BS 167 today 9-6 BS 152

## 2018-07-03 NOTE — Telephone Encounter (Signed)
Pt aware of response below.  

## 2018-07-03 NOTE — Telephone Encounter (Signed)
Has she had an steroid injections by ortho for any joint pain?  The sugars look better in the past 24 hrs and may be coming down on their own so we may not need to do anything  -- if they remain elevated more than 200 over the week she should take an extra glyburide in the morning.     She should call on Monday with an update or sooner if the sugars are still elevated.

## 2018-07-06 ENCOUNTER — Ambulatory Visit: Payer: Medicare Other | Admitting: Podiatry

## 2018-07-06 ENCOUNTER — Ambulatory Visit (INDEPENDENT_AMBULATORY_CARE_PROVIDER_SITE_OTHER): Payer: Medicare Other | Admitting: Internal Medicine

## 2018-07-06 ENCOUNTER — Encounter: Payer: Self-pay | Admitting: Internal Medicine

## 2018-07-06 ENCOUNTER — Other Ambulatory Visit (INDEPENDENT_AMBULATORY_CARE_PROVIDER_SITE_OTHER): Payer: Medicare Other

## 2018-07-06 VITALS — BP 112/62 | HR 94 | Temp 98.8°F | Resp 16 | Ht 60.0 in | Wt 161.0 lb

## 2018-07-06 DIAGNOSIS — E1165 Type 2 diabetes mellitus with hyperglycemia: Secondary | ICD-10-CM | POA: Diagnosis not present

## 2018-07-06 DIAGNOSIS — M542 Cervicalgia: Secondary | ICD-10-CM

## 2018-07-06 DIAGNOSIS — E1140 Type 2 diabetes mellitus with diabetic neuropathy, unspecified: Secondary | ICD-10-CM

## 2018-07-06 DIAGNOSIS — IMO0002 Reserved for concepts with insufficient information to code with codable children: Secondary | ICD-10-CM

## 2018-07-06 LAB — CBC WITH DIFFERENTIAL/PLATELET
Basophils Absolute: 0 10*3/uL (ref 0.0–0.1)
Basophils Relative: 0.3 % (ref 0.0–3.0)
Eosinophils Absolute: 0.2 10*3/uL (ref 0.0–0.7)
Eosinophils Relative: 1.8 % (ref 0.0–5.0)
HCT: 35.3 % — ABNORMAL LOW (ref 36.0–46.0)
Hemoglobin: 11.7 g/dL — ABNORMAL LOW (ref 12.0–15.0)
Lymphocytes Relative: 18.4 % (ref 12.0–46.0)
Lymphs Abs: 2.1 10*3/uL (ref 0.7–4.0)
MCHC: 33 g/dL (ref 30.0–36.0)
MCV: 86.2 fl (ref 78.0–100.0)
Monocytes Absolute: 0.9 10*3/uL (ref 0.1–1.0)
Monocytes Relative: 7.4 % (ref 3.0–12.0)
Neutro Abs: 8.3 10*3/uL — ABNORMAL HIGH (ref 1.4–7.7)
Neutrophils Relative %: 72.1 % (ref 43.0–77.0)
Platelets: 358 10*3/uL (ref 150.0–400.0)
RBC: 4.1 Mil/uL (ref 3.87–5.11)
RDW: 14.1 % (ref 11.5–15.5)
WBC: 11.6 10*3/uL — ABNORMAL HIGH (ref 4.0–10.5)

## 2018-07-06 LAB — COMPREHENSIVE METABOLIC PANEL
ALT: 20 U/L (ref 0–35)
AST: 21 U/L (ref 0–37)
Albumin: 4.3 g/dL (ref 3.5–5.2)
Alkaline Phosphatase: 69 U/L (ref 39–117)
BUN: 24 mg/dL — ABNORMAL HIGH (ref 6–23)
CO2: 28 mEq/L (ref 19–32)
Calcium: 10.4 mg/dL (ref 8.4–10.5)
Chloride: 103 mEq/L (ref 96–112)
Creatinine, Ser: 0.89 mg/dL (ref 0.40–1.20)
GFR: 79.89 mL/min (ref >60.00)
Glucose, Bld: 111 mg/dL — ABNORMAL HIGH (ref 70–99)
Potassium: 3.8 mEq/L (ref 3.5–5.1)
Sodium: 139 mEq/L (ref 135–145)
Total Bilirubin: 0.3 mg/dL (ref 0.2–1.2)
Total Protein: 8.3 g/dL (ref 6.0–8.3)

## 2018-07-06 LAB — HEMOGLOBIN A1C: Hgb A1c MFr Bld: 6.7 % — ABNORMAL HIGH (ref 4.6–6.5)

## 2018-07-06 NOTE — Assessment & Plan Note (Addendum)
She is taking her medication  Sugars transiently very high - but coming down now ?  Infection Will complete doxy Cbc, cmp, a1c Call if no improvement

## 2018-07-06 NOTE — Patient Instructions (Addendum)
Do not take any motrin, advil/ibuprofen, aleve.   Increase the meloxicam to 2 pills daily.    Try taking two of the muscle relaxers at bedtime.  Continue heat.  Try ice.    Finish the antibiotic   Get blood work today.    Consider doing physical therapy.

## 2018-07-06 NOTE — Assessment & Plan Note (Signed)
Sounds musculoskeletal to me Muscle relaxer's have not helped - will try increasing to 2 pills at night Increase mobic to 15 mg daily - no other nsaids Heat, stretch Deferred PT

## 2018-07-06 NOTE — Progress Notes (Signed)
Subjective:    Patient ID: Heidi Franklin, female    DOB: 1946/06/25, 72 y.o.   MRN: [id number]  HPI The patient is here for an acute visit.  Neck pain:  I prescribed a muscle relaxer, which did not help.  She saw Dr Tamala Julian and he was concerned she may have had strep throat and was prescribed mobic and doxycycline.  She initially had improvement, but now the pain is worse and back to when it first started.   The neck pain is worse with movement.  Her pain is 8/10.  Rubbing the neck with salves helps a little, but it comes back.  The motrin helped a little.  The pain is worse at night - she can't sleep because she can not get comfortable.     Her sugar this morning was 135.  It has come down - she is not sure why it was elevated.  She does not think it was from something she ate.  She denies steroid injections or changes in medication.    Medications and allergies reviewed with patient and updated if appropriate.  Patient Active Problem List   Diagnosis Date Noted  . Strep throat 07/01/2018  . Trapezius muscle strain, right, initial encounter 06/23/2018  . Edema 06/23/2018  . Acute gout 04/08/2018  . Herpes simplex 02/10/2018  . ETD (eustachian tube dysfunction) 02/10/2018  . Peroneal tendinitis, right 01/06/2018  . Psoriasis 11/14/2017  . Muscle tightness 11/14/2017  . Muscle twitching 11/14/2017  . Acute bursitis of right shoulder 09/29/2017  . Bilateral hip pain 07/10/2017  . Bronchiectasis without complication ([place]) 26/83/4196  . Irritable larynx syndrome 04/25/2017  . Benign lipomatous neoplasm of skin and subcutaneous tissue of right leg 02/25/2017  . Anemia 02/05/2017  . Fatigue 01/23/2017  . Chronic cough 01/02/2017  . Multiple pulmonary nodules 11/10/2016  . Acute midline low back pain with right-sided sciatica 10/09/2016  . Degenerative arthritis of right knee 06/20/2016  . Degenerative arthritis of left knee 04/10/2016  . Peroneal tendinitis of left lower extremity  09/05/2015  . Recurrent falls 08/08/2015  . Bursitis of left shoulder 07/13/2015  . Morbid obesity due to excess calories ([place]) 04/16/2015  . Nonspecific abnormal electrocardiogram (ECG) (EKG) 05/11/2011  . Tenosynovitis of finger 04/22/2011  . Type 2 diabetes, uncontrolled, with neuropathy ([place]) 03/02/2010  . Hyperlipidemia associated with type 2 diabetes mellitus ([place]) 03/02/2010  . Depression with anxiety 03/02/2010  . Essential hypertension 03/02/2010  . Chronic rhinitis 03/02/2010  . Upper airway cough syndrome 03/02/2010  . GERD 03/02/2010    Current Outpatient Medications on File Prior to Visit  Medication Sig Dispense Refill  . acetaminophen (TYLENOL) 325 MG tablet Take 650 mg by mouth every 6 (six) hours as needed.    Marland Kitchen albuterol (VENTOLIN HFA) 108 (90 Base) MCG/ACT inhaler Inhale 1-2 puffs into the lungs every 6 (six) hours as needed for wheezing or shortness of breath. 1 Inhaler 3  . ALPRAZolam (XANAX) 0.5 MG tablet TAKE 1 TABLET BY MOUTH EVERY 8 HOURS IF NEEDED FOR ANXIETY 30 tablet 0  . amLODipine (NORVASC) 10 MG tablet Take 1 tablet (10 mg total) by mouth daily. 90 tablet 1  . aspirin 81 MG tablet Take 81 mg by mouth every morning.     . Calcium Citrate-Vitamin D (CALCIUM + D PO) Take 1 tablet by mouth every morning.    . chlorpheniramine (CHLOR-TRIMETON) 4 MG tablet Take 2 tabs by mouth at bedtime and every 4 hours as needed for  drainage/drippy nose/sneezing    . doxycycline (VIBRA-TABS) 100 MG tablet Take 1 tablet (100 mg total) by mouth 2 (two) times daily for 10 days. 20 tablet 0  . famotidine (PEPCID) 20 MG tablet Take 1 tablet (20 mg total) by mouth at bedtime. 30 tablet 11  . fluocinonide ointment (LIDEX) 6.37 % Apply 1 application topically as needed.     . fluticasone (FLONASE) 50 MCG/ACT nasal spray USE 1 SPRAY IN EACH NOSTRIL TWICE DAILY 48 g 1  . glucose blood (ONE TOUCH ULTRA TEST) test strip 1 each by Other route daily. Use 1 strips to check blood sugar twice a  day Ex E11.9 100 each 3  . glyBURIDE (DIABETA) 2.5 MG tablet TAKE 1 TABLET EVERY DAY WITH BREAKFAST 90 tablet 0  . Lancets (ONETOUCH ULTRASOFT) lancets PRN 100 each 3  . losartan-hydrochlorothiazide (HYZAAR) 100-12.5 MG tablet TAKE 1 TABLET EVERY DAY 90 tablet 1  . lovastatin (MEVACOR) 20 MG tablet TAKE 1 TABLET AT BEDTIME 90 tablet 1  . meloxicam (MOBIC) 7.5 MG tablet Take 1 tablet (7.5 mg total) by mouth daily. 30 tablet 0  . mometasone (ELOCON) 0.1 % ointment     . montelukast (SINGULAIR) 10 MG tablet Take 1 tablet (10 mg total) by mouth at bedtime. 30 tablet 5  . Multiple Vitamins-Minerals (CENTRUM SILVER PO) Take 1 tablet by mouth every morning.    Marland Kitchen omeprazole (PRILOSEC) 40 MG capsule Take 40 mg by mouth daily.    . Potassium Chloride ER 20 MEQ TBCR TAKE 1 TABLET EVERY DAY 90 tablet 3  . Respiratory Therapy Supplies (FLUTTER) DEVI Use as directed 1 each 0  . sertraline (ZOLOFT) 100 MG tablet TAKE 1 TABLET EVERY MORNING 90 tablet 1  . tiZANidine (ZANAFLEX) 2 MG tablet Take 1 tablet (2 mg total) by mouth every 6 (six) hours as needed for muscle spasms. 30 tablet 0  . valACYclovir (VALTREX) 1000 MG tablet Take 2 tabs by mouth every 12 hours for one day. 12 tablet 5  . Vitamin D, Ergocalciferol, (DRISDOL) 50000 units CAPS capsule TAKE 1 CAPSULE (50,000 UNITS TOTAL) BY MOUTH EVERY 7 (SEVEN) DAYS. 12 capsule 0   No current facility-administered medications on file prior to visit.     Past Medical History:  Diagnosis Date  . ALLERGIC RHINITIS   . Allergy   . Anxiety state, unspecified   . Arthritis   . BRONCHITIS, CHRONIC   . DEPRESSION   . DIABETES MELLITUS, TYPE II   . GERD   . HYPERLIPIDEMIA   . HYPERTENSION   . Psoriasis     Past Surgical History:  Procedure Laterality Date  . ABDOMINAL HYSTERECTOMY  1980  . TRIGGER FINGER RELEASE     Index finger    Social History   Socioeconomic History  . Marital status: Married    Spouse name: Not on file  . Number of children:  0  . Years of education: 56yr colge  . Highest education level: Not on file  Occupational History  . Occupation: Retired   Scientific laboratory technician  . Financial resource strain: Somewhat hard  . Food insecurity:    Worry: Sometimes true    Inability: Sometimes true  . Transportation needs:    Medical: No    Non-medical: No  Tobacco Use  . Smoking status: Never Smoker  . Smokeless tobacco: Never Used  Substance and Sexual Activity  . Alcohol use: No    Alcohol/week: 0.0 standard drinks  . Drug use: No  .  Sexual activity: Not Currently  Lifestyle  . Physical activity:    Days per week: 0 days    Minutes per session: 0 min  . Stress: Very much  Relationships  . Social connections:    Talks on phone: More than three times a week    Gets together: More than three times a week    Attends religious service: More than 4 times per year    Active member of club or organization: Yes    Attends meetings of clubs or organizations: More than 4 times per year    Relationship status: Married  Other Topics Concern  . Not on file  Social History Narrative   Married, lives with spouse-retired from [employer] insurance   Not employed    Drinks coffee occasional, Consumes 1 soda a day     Family History  Problem Relation Age of Onset  . Stroke Mother   . Angina Mother   . Diabetes Father   . Hyperlipidemia Other        Parent  . Hypertension Other        Parent  . Diabetes Sister        x1  . CAD Sister   . Diabetes Brother        x 2  . Colon cancer Neg Hx   . Esophageal cancer Neg Hx   . Rectal cancer Neg Hx   . Stomach cancer Neg Hx   . Pancreatic cancer Neg Hx     Review of Systems  Constitutional: Negative for chills and fever (98.6).  HENT: Negative for congestion, ear pain (ear pressure and popping), sinus pressure, sinus pain and sore throat.   Respiratory: Positive for cough (dry) and shortness of breath (sometimes). Negative for wheezing.   Musculoskeletal: Positive for neck pain  and neck stiffness.  Neurological: Positive for headaches. Negative for dizziness, weakness, light-headedness and numbness.       Objective:   Vitals:   07/06/18 1026  BP: 112/62  Pulse: 94  Resp: 16  Temp: 98.8 F (37.1 C)  SpO2: 97%   BP Readings from Last 3 Encounters:  07/06/18 112/62  07/01/18 (!) 146/74  06/23/18 130/62   Wt Readings from Last 3 Encounters:  07/06/18 161 lb (73 kg)  07/01/18 164 lb (74.4 kg)  06/23/18 165 lb (74.8 kg)   Body mass index is 31.44 kg/m.   Physical Exam    Constitutional: Appears well-developed and well-nourished. No distress.  HENT:  Head: Normocephalic and atraumatic.  Neck: Neck supple. No tracheal deviation present. No thyromegaly present.  No cervical lymphadenopathy, no oropharynx erythema Cardiovascular: Normal rate, regular rhythm and normal heart sounds.   No murmur heard. No carotid bruit .  No edema Pulmonary/Chest: Effort normal and breath sounds normal. No respiratory distress. No has no wheezes. No rales.  Msk:  Tenderness posterior neck - paravertebral regions - no superficially, increased pain with movement.  No trapezius pain with palpation Skin: Skin is warm and dry. Not diaphoretic.  Psychiatric: Normal mood and affect. Behavior is normal.       Assessment & Plan:    See Problem List for Assessment and Plan of chronic medical problems.

## 2018-07-09 ENCOUNTER — Telehealth: Payer: Self-pay

## 2018-07-09 MED ORDER — CYCLOBENZAPRINE HCL 5 MG PO TABS: 5.0000 mg | ORAL_TABLET | Freq: Three times a day (TID) | ORAL | 0 refills | Status: DC | PRN

## 2018-07-09 NOTE — Telephone Encounter (Signed)
Can refer her to PT.   She can try seeing a chiropractor.  She can just stretch her neck to try to loosen up the muscles.    Can try a different muscle relaxer.

## 2018-07-09 NOTE — Telephone Encounter (Signed)
Copied from [place] [id number]. Topic: Inquiry >> Jul 09, 2018 10:23 AM Oliver Pila B wrote: Reason for CRM: pt called to ask if there are any additional exercises/treatment that can be added to her routine to what she was already given b/c the pain is not getting better; contact to advise

## 2018-07-09 NOTE — Telephone Encounter (Signed)
rx sent

## 2018-07-09 NOTE — Telephone Encounter (Signed)
Please advise 

## 2018-07-09 NOTE — Telephone Encounter (Signed)
Pt prefers trying another muscle relaxer first. She states that her husband has been massaging to help with loosening with muscles. She has also been using heat and ice which all helped some.

## 2018-07-13 NOTE — Progress Notes (Signed)
Subjective:    Patient ID: Heidi Franklin, female    DOB: 15-Dec-1945, 72 y.o.   MRN: [id number]  HPI The patient is here for follow up of her neck pain and follow-up of her other chronic medical problems.  She still has the right posterior neck pain of 6/10.  The pain is intermittent.  She denies pain during the day and only feels it at night when she lays down.  Rubbing the neck and putting heat on it helps.    She has taken the flexeril at night and that has helped a little.  She completed the antibiotic.  She has posterior headaches.  She denies dizziness/lightheadeness.  She denies fever.   Hypertension: She is taking her medication daily. She is compliant with a low sodium diet.  She denies chest pain, palpitations, shortness of breath and regular headaches, outside of her neck pain. She is not exercising regularly.     Diabetes: She is taking her medication daily as prescribed. She is compliant with a diabetic diet. She is not exercising regularly. She checks her feet daily and denies foot lesions. She is up-to-date with an ophthalmology examination.   Depression, anxiety:  She is taing sretraline daily.  She takes the xanax only prn.  She still has some anxiety, but does not feel she needs any change in her medication.  Medications and allergies reviewed with patient and updated if appropriate.  Patient Active Problem List   Diagnosis Date Noted  . Neck pain 07/06/2018  . Strep throat 07/01/2018  . Trapezius muscle strain, right, initial encounter 06/23/2018  . Edema 06/23/2018  . Acute gout 04/08/2018  . Herpes simplex 02/10/2018  . ETD (eustachian tube dysfunction) 02/10/2018  . Peroneal tendinitis, right 01/06/2018  . Psoriasis 11/14/2017  . Muscle tightness 11/14/2017  . Muscle twitching 11/14/2017  . Acute bursitis of right shoulder 09/29/2017  . Bilateral hip pain 07/10/2017  . Bronchiectasis without complication ([place]) 56/21/3086  . Irritable larynx syndrome 04/25/2017    . Benign lipomatous neoplasm of skin and subcutaneous tissue of right leg 02/25/2017  . Anemia 02/05/2017  . Fatigue 01/23/2017  . Chronic cough 01/02/2017  . Multiple pulmonary nodules 11/10/2016  . Acute midline low back pain with right-sided sciatica 10/09/2016  . Degenerative arthritis of right knee 06/20/2016  . Degenerative arthritis of left knee 04/10/2016  . Peroneal tendinitis of left lower extremity 09/05/2015  . Recurrent falls 08/08/2015  . Bursitis of left shoulder 07/13/2015  . Morbid obesity due to excess calories ([place]) 04/16/2015  . Nonspecific abnormal electrocardiogram (ECG) (EKG) 05/11/2011  . Tenosynovitis of finger 04/22/2011  . Type 2 diabetes, uncontrolled, with neuropathy ([place]) 03/02/2010  . Hyperlipidemia associated with type 2 diabetes mellitus ([place]) 03/02/2010  . Depression with anxiety 03/02/2010  . Essential hypertension 03/02/2010  . Chronic rhinitis 03/02/2010  . Upper airway cough syndrome 03/02/2010  . GERD 03/02/2010    Current Outpatient Medications on File Prior to Visit  Medication Sig Dispense Refill  . acetaminophen (TYLENOL) 325 MG tablet Take 650 mg by mouth every 6 (six) hours as needed.    Marland Kitchen albuterol (VENTOLIN HFA) 108 (90 Base) MCG/ACT inhaler Inhale 1-2 puffs into the lungs every 6 (six) hours as needed for wheezing or shortness of breath. 1 Inhaler 3  . ALPRAZolam (XANAX) 0.5 MG tablet TAKE 1 TABLET BY MOUTH EVERY 8 HOURS IF NEEDED FOR ANXIETY 30 tablet 0  . amLODipine (NORVASC) 10 MG tablet Take 1 tablet (10 mg total) by  mouth daily. 90 tablet 1  . aspirin 81 MG tablet Take 81 mg by mouth every morning.     . Calcium Citrate-Vitamin D (CALCIUM + D PO) Take 1 tablet by mouth every morning.    . chlorpheniramine (CHLOR-TRIMETON) 4 MG tablet Take 2 tabs by mouth at bedtime and every 4 hours as needed for drainage/drippy nose/sneezing    . cyclobenzaprine (FLEXERIL) 5 MG tablet Take 1-2 tablets (5-10 mg total) by mouth 3 (three) times  daily as needed for muscle spasms. 30 tablet 0  . famotidine (PEPCID) 20 MG tablet Take 1 tablet (20 mg total) by mouth at bedtime. 30 tablet 11  . fluocinonide ointment (LIDEX) 3.26 % Apply 1 application topically as needed.     . fluticasone (FLONASE) 50 MCG/ACT nasal spray USE 1 SPRAY IN EACH NOSTRIL TWICE DAILY 48 g 1  . glucose blood (ONE TOUCH ULTRA TEST) test strip 1 each by Other route daily. Use 1 strips to check blood sugar twice a day Ex E11.9 100 each 3  . glyBURIDE (DIABETA) 2.5 MG tablet TAKE 1 TABLET EVERY DAY WITH BREAKFAST 90 tablet 0  . Lancets (ONETOUCH ULTRASOFT) lancets PRN 100 each 3  . losartan-hydrochlorothiazide (HYZAAR) 100-12.5 MG tablet TAKE 1 TABLET EVERY DAY 90 tablet 1  . lovastatin (MEVACOR) 20 MG tablet TAKE 1 TABLET AT BEDTIME 90 tablet 1  . meloxicam (MOBIC) 7.5 MG tablet Take 1 tablet (7.5 mg total) by mouth daily. 30 tablet 0  . mometasone (ELOCON) 0.1 % ointment     . montelukast (SINGULAIR) 10 MG tablet Take 1 tablet (10 mg total) by mouth at bedtime. 30 tablet 5  . Multiple Vitamins-Minerals (CENTRUM SILVER PO) Take 1 tablet by mouth every morning.    Marland Kitchen omeprazole (PRILOSEC) 40 MG capsule Take 40 mg by mouth daily.    . Potassium Chloride ER 20 MEQ TBCR TAKE 1 TABLET EVERY DAY 90 tablet 3  . Respiratory Therapy Supplies (FLUTTER) DEVI Use as directed 1 each 0  . sertraline (ZOLOFT) 100 MG tablet TAKE 1 TABLET EVERY MORNING 90 tablet 1  . valACYclovir (VALTREX) 1000 MG tablet Take 2 tabs by mouth every 12 hours for one day. 12 tablet 5  . Vitamin D, Ergocalciferol, (DRISDOL) 50000 units CAPS capsule TAKE 1 CAPSULE (50,000 UNITS TOTAL) BY MOUTH EVERY 7 (SEVEN) DAYS. 12 capsule 0   No current facility-administered medications on file prior to visit.     Past Medical History:  Diagnosis Date  . ALLERGIC RHINITIS   . Allergy   . Anxiety state, unspecified   . Arthritis   . BRONCHITIS, CHRONIC   . DEPRESSION   . DIABETES MELLITUS, TYPE II   . GERD    . HYPERLIPIDEMIA   . HYPERTENSION   . Psoriasis     Past Surgical History:  Procedure Laterality Date  . ABDOMINAL HYSTERECTOMY  1980  . TRIGGER FINGER RELEASE     Index finger    Social History   Socioeconomic History  . Marital status: Married    Spouse name: Not on file  . Number of children: 0  . Years of education: 51yr colge  . Highest education level: Not on file  Occupational History  . Occupation: Retired   Scientific laboratory technician  . Financial resource strain: Somewhat hard  . Food insecurity:    Worry: Sometimes true    Inability: Sometimes true  . Transportation needs:    Medical: No    Non-medical: No  Tobacco Use  .  Smoking status: Never Smoker  . Smokeless tobacco: Never Used  Substance and Sexual Activity  . Alcohol use: No    Alcohol/week: 0.0 standard drinks  . Drug use: No  . Sexual activity: Not Currently  Lifestyle  . Physical activity:    Days per week: 0 days    Minutes per session: 0 min  . Stress: Very much  Relationships  . Social connections:    Talks on phone: More than three times a week    Gets together: More than three times a week    Attends religious service: More than 4 times per year    Active member of club or organization: Yes    Attends meetings of clubs or organizations: More than 4 times per year    Relationship status: Married  Other Topics Concern  . Not on file  Social History Narrative   Married, lives with spouse-retired from [employer] insurance   Not employed    Drinks coffee occasional, Consumes 1 soda a day     Family History  Problem Relation Age of Onset  . Stroke Mother   . Angina Mother   . Diabetes Father   . Hyperlipidemia Other        Parent  . Hypertension Other        Parent  . Diabetes Sister        x1  . CAD Sister   . Diabetes Brother        x 2  . Colon cancer Neg Hx   . Esophageal cancer Neg Hx   . Rectal cancer Neg Hx   . Stomach cancer Neg Hx   . Pancreatic cancer Neg Hx     Review of  Systems  Constitutional: Negative for fever.  HENT: Negative for congestion, sinus pain and sore throat.   Respiratory: Positive for cough (chronic, intermittent). Negative for shortness of breath and wheezing.   Cardiovascular: Positive for leg swelling (intermittent). Negative for chest pain and palpitations.  Neurological: Positive for numbness (left hand when she first wakes up - resolves with movement) and headaches. Negative for dizziness, weakness (arms) and light-headedness.       Objective:   Vitals:   07/14/18 1116  BP: 136/64  Pulse: 90  Resp: 16  Temp: 98.9 F (37.2 C)  SpO2: 97%   BP Readings from Last 3 Encounters:  07/14/18 136/64  07/06/18 112/62  07/01/18 (!) 146/74   Wt Readings from Last 3 Encounters:  07/14/18 162 lb 12.8 oz (73.8 kg)  07/06/18 161 lb (73 kg)  07/01/18 164 lb (74.4 kg)   Body mass index is 31.79 kg/m.   Physical Exam    Constitutional: Appears well-developed and well-nourished. No distress.  HENT:  Head: Normocephalic and atraumatic.  Neck: Neck supple. No tracheal deviation present. No thyromegaly present.  No cervical lymphadenopathy Cardiovascular: Normal rate, regular rhythm and normal heart sounds.   No murmur heard. No carotid bruit .  No edema Pulmonary/Chest: Effort normal and breath sounds normal. No respiratory distress. No has no wheezes. No rales. Musculoskeletal: No tenderness with palpation right side of neck or right posterior neck, she does have some pain with certain movements of her head.  Normal strength and sensation to lateral upper extremities Skin: Skin is warm and dry. Not diaphoretic.  Psychiatric: Normal mood and affect. Behavior is normal.      Assessment & Plan:    See Problem List for Assessment and Plan of chronic medical problems.

## 2018-07-14 ENCOUNTER — Ambulatory Visit (INDEPENDENT_AMBULATORY_CARE_PROVIDER_SITE_OTHER): Payer: Medicare Other | Admitting: Internal Medicine

## 2018-07-14 ENCOUNTER — Encounter: Payer: Self-pay | Admitting: Internal Medicine

## 2018-07-14 VITALS — BP 136/64 | HR 90 | Temp 98.9°F | Resp 16 | Ht 60.0 in | Wt 162.8 lb

## 2018-07-14 DIAGNOSIS — E1140 Type 2 diabetes mellitus with diabetic neuropathy, unspecified: Secondary | ICD-10-CM | POA: Diagnosis not present

## 2018-07-14 DIAGNOSIS — E1165 Type 2 diabetes mellitus with hyperglycemia: Secondary | ICD-10-CM

## 2018-07-14 DIAGNOSIS — F418 Other specified anxiety disorders: Secondary | ICD-10-CM | POA: Diagnosis not present

## 2018-07-14 DIAGNOSIS — I10 Essential (primary) hypertension: Secondary | ICD-10-CM | POA: Diagnosis not present

## 2018-07-14 DIAGNOSIS — M542 Cervicalgia: Secondary | ICD-10-CM

## 2018-07-14 DIAGNOSIS — IMO0002 Reserved for concepts with insufficient information to code with codable children: Secondary | ICD-10-CM

## 2018-07-14 NOTE — Assessment & Plan Note (Signed)
She states her neck pain is better Completing the antibiotic for possible infection Taking Flexeril at night Applying topical BenGay Continue heat/cold Massage, stretch Deferred physical therapy, but will let me know if she wants a referral at some point

## 2018-07-14 NOTE — Assessment & Plan Note (Signed)
BP well controlled Current regimen effective and well tolerated Continue current medications at current doses CMP done recently-reviewed

## 2018-07-14 NOTE — Assessment & Plan Note (Signed)
Fairly controlled Still has some anxiety Overall happy with her current medication regimen-no changes

## 2018-07-14 NOTE — Patient Instructions (Addendum)
For your neck pain.  Continue the heat/ice.  Continue the muscle relaxer at night. massage and stretch the area.    Continue your current medications.    Follow up in 6 months

## 2018-07-14 NOTE — Assessment & Plan Note (Signed)
A1c controlled Continue current medications Stressed the importance of regular exercise Follow-up in 6 months

## 2018-07-21 ENCOUNTER — Ambulatory Visit (INDEPENDENT_AMBULATORY_CARE_PROVIDER_SITE_OTHER): Payer: Medicare Other | Admitting: Neurology

## 2018-07-21 DIAGNOSIS — G4761 Periodic limb movement disorder: Secondary | ICD-10-CM

## 2018-07-21 DIAGNOSIS — G478 Other sleep disorders: Secondary | ICD-10-CM

## 2018-07-21 DIAGNOSIS — F439 Reaction to severe stress, unspecified: Secondary | ICD-10-CM

## 2018-07-21 DIAGNOSIS — G4720 Circadian rhythm sleep disorder, unspecified type: Secondary | ICD-10-CM

## 2018-07-21 DIAGNOSIS — E669 Obesity, unspecified: Secondary | ICD-10-CM

## 2018-07-21 DIAGNOSIS — G479 Sleep disorder, unspecified: Secondary | ICD-10-CM

## 2018-07-21 DIAGNOSIS — R0683 Snoring: Secondary | ICD-10-CM

## 2018-07-21 DIAGNOSIS — R252 Cramp and spasm: Secondary | ICD-10-CM

## 2018-07-22 ENCOUNTER — Telehealth: Payer: Self-pay

## 2018-07-22 NOTE — Telephone Encounter (Signed)
-----   Message from Star Age, MD sent at 07/22/2018  8:26 AM EDT ----- Patient referred by Dr. Quay Burow, seen by me on 06/22/18, diagnostic PSG on 07/21/18.   Please call and notify the patient that the recent sleep study did not show any significant obstructive sleep apnea with the exception of mild and intermittent snoring; weight loss and avoidance of the supine sleep position will likely help alleviate her snoring. This study does not support an intrinsic sleep disorder as a cause of the patient's symptoms of twitching. However, the study was limited by poor sleep efficiency and significant sleep fragmentation and reduced REM sleep.   Please remind patient to try to maintain good sleep hygiene, which means: Keep a regular sleep and wake schedule and make enough time for sleep (7 1/2 to 8 1/2 hours for the average adult), try not to exercise or have a meal within 2 hours of your bedtime, try to keep your bedroom conducive for sleep, that is, cool and dark, without light distractors such as an illuminated alarm clock, and refrain from watching TV right before sleep or in the middle of the night and do not keep the TV or radio on during the night. If a nightlight is used, have it away from the visual field. Also, try not to use or play on electronic devices at bedtime, such as your cell phone, tablet PC or laptop. If you like to read at bedtime on an electronic device, try to dim the background light as much as possible. Do not eat in the middle of the night. Keep pets away from the bedroom environment. For stress relief, try meditation, deep breathing exercises (there are many books and CDs available), a white noise machine or fan can help to diffuse other noise distractors, such as traffic noise. Do not drink alcohol before bedtime, as it can disturb sleep and cause middle of the night awakenings. Never mix alcohol and sedating medications! Avoid narcotic pain medication close to bedtime, as opioids/narcotics  can suppress breathing drive and breathing effort.    We had talked about suboptimal control of her mood related Sx and stress during the office visit. Again, I would recommend she FU with PCP regarding these issues, as poor sleep can go hand in hand with stress and depression/anxiety.   Thanks,  Star Age, MD, PhD [facility])

## 2018-07-22 NOTE — Telephone Encounter (Signed)
I called pt. I advised pt that Dr. Rexene Alberts reviewed pt's sleep study and found that pt did not show any significant osa, except for mild intermittent snoring. Pt did not sleep well and the study was limited by poor sleep efficiency and significant sleep fragmentation and reduced REM. Dr. Rexene Alberts recommends that pt follow up with PCP regarding her mood related sx and stress as discussed in the office visit. I reviewed sleep hygiene recommendations with the pt, including trying to keep a regular sleep wake schedule, avoiding electronics in the bedroom, keeping the bedroom cool, dark, and quiet, and avoiding eating or exercising within 2 hours of bedtime as well as eating in the middle of the night. I advised pt to keep pets out of the bedroom. I discussed with pt the importance of stress relief and to try meditation, deep breathing exercises, and/or a white noise machine or fan to diffuse other noise distractors. I advised pt to not drink alcohol before bedtime and to never mix alcohol and sedating medications. Pt was advised to avoid narcotic pain medication close to bedtime. I advised pt that a copy of these sleep study results will be sent to Dr. Quay Burow. Pt verbalized understanding of results. Pt had no questions at this time but was encouraged to call back if questions arise.

## 2018-07-22 NOTE — Progress Notes (Signed)
Patient referred by Dr. Quay Burow, seen by me on 06/22/18, diagnostic PSG on 07/21/18.   Please call and notify the patient that the recent sleep study did not show any significant obstructive sleep apnea with the exception of mild and intermittent snoring; weight loss and avoidance of the supine sleep position will likely help alleviate her snoring. This study does not support an intrinsic sleep disorder as a cause of the patient's symptoms of twitching. However, the study was limited by poor sleep efficiency and significant sleep fragmentation and reduced REM sleep.   Please remind patient to try to maintain good sleep hygiene, which means: Keep a regular sleep and wake schedule and make enough time for sleep (7 1/2 to 8 1/2 hours for the average adult), try not to exercise or have a meal within 2 hours of your bedtime, try to keep your bedroom conducive for sleep, that is, cool and dark, without light distractors such as an illuminated alarm clock, and refrain from watching TV right before sleep or in the middle of the night and do not keep the TV or radio on during the night. If a nightlight is used, have it away from the visual field. Also, try not to use or play on electronic devices at bedtime, such as your cell phone, tablet PC or laptop. If you like to read at bedtime on an electronic device, try to dim the background light as much as possible. Do not eat in the middle of the night. Keep pets away from the bedroom environment. For stress relief, try meditation, deep breathing exercises (there are many books and CDs available), a white noise machine or fan can help to diffuse other noise distractors, such as traffic noise. Do not drink alcohol before bedtime, as it can disturb sleep and cause middle of the night awakenings. Never mix alcohol and sedating medications! Avoid narcotic pain medication close to bedtime, as opioids/narcotics can suppress breathing drive and breathing effort.    We had talked about  suboptimal control of her mood related Sx and stress during the office visit. Again, I would recommend she FU with PCP regarding these issues, as poor sleep can go hand in hand with stress and depression/anxiety.   Thanks,  Star Age, MD, PhD [facility])

## 2018-07-22 NOTE — Procedures (Signed)
PATIENT'S NAME:  Heidi Franklin, Heidi Franklin DOB:      [birth year]      MR#:    [id number]     DATE OF RECORDING: 07/21/2018 REFERRING M.D.:  Billey Gosling MD Study Performed:   Baseline Polysomnogram HISTORY: 72 year old woman with a history of chronic cough, psoriasis, hip pain, anemia, neck pain, arthritis, reflux disease, and obesity, who reports twitching in her shoulder areas prior to falling asleep. The patient endorsed the Epworth Sleepiness Scale at 7 points. The patient's weight 170 pounds with a height of 60 (inches), resulting in a BMI of 33.3 kg/m2. The patient's neck circumference measured 14.8 inches.  CURRENT MEDICATIONS: Tylenol, Ventolin, Xanax, Norvasc, Pepcid, Lidex, Flonase, Diabeta, Advil, Mobic, Mevacor, Elocon, Singulair, Prilosec, Zoloft, Valtrex, Drisdol, Deltasone   PROCEDURE:  This is a multichannel digital polysomnogram utilizing the Somnostar 11.2 system.  Electrodes and sensors were applied and monitored per AASM Specifications.   EEG, EOG, Chin and Limb EMG, were sampled at 200 Hz.  ECG, Snore and Nasal Pressure, Thermal Airflow, Respiratory Effort, CPAP Flow and Pressure, Oximetry was sampled at 50 Hz. Digital video and audio were recorded.      BASELINE STUDY  Lights Out was at 22:27 and Lights On at 05:01.  Total recording time (TRT) was 394 minutes, with a total sleep time (TST) of 166.5 minutes.   The patient's sleep latency was 89.5 minutes, which is delayed. REM latency was 300.5 minutes, which is markedly delayed. The sleep efficiency was 42.3%, which is markedly reduced.     SLEEP ARCHITECTURE: WASO (Wake after sleep onset) was 147.5 minutes with moderate to severe sleep fragmentation noted. There were 38 minutes in Stage N1, 39 minutes Stage N2, 76 minutes Stage N3 and 13.5 minutes in Stage REM.  The percentage of Stage N1 was 22.8%, which is highly increased, Stage N2 was 23.4%, Stage N3 was 45.6%, which is increased, and Stage R (REM sleep) was 8.1%, which is significantly  reduced. The arousals were noted as: 40 were spontaneous, 2 were associated with PLMs, 0 were associated with respiratory events.  RESPIRATORY ANALYSIS:  There were a total of 2 respiratory events:  0 obstructive apneas, 0 central apneas and 0 mixed apneas with a total of 0 apneas and an apnea index (AI) of 0 /hour. There were 2 hypopneas with a hypopnea index of .7 /hour. The patient also had 0 respiratory event related arousals (RERAs).      The total APNEA/HYPOPNEA INDEX (AHI) was .7 /hour and the total RESPIRATORY DISTURBANCE INDEX was .7 /hour.  1 events occurred in REM sleep and 2 events in NREM. The REM AHI was 4.4 /hour, versus a non-REM AHI of .4. The patient spent 18.5 minutes of total sleep time in the supine position and 148 minutes in non-supine.. The supine AHI was 0.0 versus a non-supine AHI of 0.8.  OXYGEN SATURATION & C02:  The Wake baseline 02 saturation was 97%, with the lowest being 88%. Time spent below 89% saturation equaled 0 minutes. PERIODIC LIMB MOVEMENTS: The patient had a total of 13 Periodic Limb Movements.  The Periodic Limb Movement (PLM) index was 4.7 and the PLM Arousal index was .7/hour.  Audio and video analysis did not show any abnormal or unusual movements, behaviors, phonations or vocalizations. The patient took 1 bathroom break. Mild intermittent snoring was noted. The EKG was in keeping with normal sinus rhythm (NSR).  Post-study, the patient indicated that sleep was worse than usual.   IMPRESSION:  1. Primary Snoring 2.  Reduced sleep efficiency 3. Dysfunctions associated with sleep stages or arousal from sleep  RECOMMENDATIONS:  1. This study does not demonstrate any significant obstructive or central sleep disordered breathing with the exception of mild and intermittent snoring; weight loss and avoidance of the supine sleep position will likely help alleviate her snoring. This study does not support an intrinsic sleep disorder as a cause of the patient's  symptoms. However, the study was limited by poor sleep efficiency and significant sleep fragmentation and reduced REM sleep. Other causes of her symptoms including circadian rhythm disturbances, an underlying mood disorder, medication effect and/or an underlying medical problem cannot be ruled out. 2. The patient should be cautioned not to drive, work at heights, or operate dangerous or heavy equipment when tired or sleepy. Review and reiteration of good sleep hygiene measures should be pursued with any patient. 3. The patient will be advised to follow up with the referring provider, who will be notified of the test results.  I certify that I have reviewed the entire raw data recording prior to the issuance of this report in accordance with the Standards of Accreditation of the American Academy of Sleep Medicine (AASM)    Star Age, MD, PhD Diplomat, American Board of Neurology and Sleep Medicine (Neurology and Sleep Medicine)

## 2018-07-28 ENCOUNTER — Encounter: Payer: Self-pay | Admitting: Sports Medicine

## 2018-07-28 ENCOUNTER — Ambulatory Visit (INDEPENDENT_AMBULATORY_CARE_PROVIDER_SITE_OTHER): Payer: Medicare Other | Admitting: Sports Medicine

## 2018-07-28 DIAGNOSIS — E119 Type 2 diabetes mellitus without complications: Secondary | ICD-10-CM

## 2018-07-28 DIAGNOSIS — L84 Corns and callosities: Secondary | ICD-10-CM

## 2018-07-28 NOTE — Progress Notes (Signed)
Subjective: Heidi Franklin is a 72 y.o. female patient with history of diabetes who presents to office today for diabetic foot exam.  Patient reports that she comes for her yearly foot exam states that she does not want her nails trimmed to trim them herself and reports that she also has noticed a hard callus lesion about a month ago on the plantar side of her left foot states that she did not check her blood sugar for today and that her last A1c 6.7.  Patient reports that she has a primary care doctor visit in December and denies any numbness tingling burning in feet or legs and admits to occasional minor ankle swelling however no major issues.  States that she has ongoing chronic back and knee issues but otherwise denies any other pedal complaints or symptoms at this time.  Patient Active Problem List   Diagnosis Date Noted  . Neck pain 07/06/2018  . Strep throat 07/01/2018  . Trapezius muscle strain, right, initial encounter 06/23/2018  . Edema 06/23/2018  . Acute gout 04/08/2018  . Herpes simplex 02/10/2018  . ETD (eustachian tube dysfunction) 02/10/2018  . Peroneal tendinitis, right 01/06/2018  . Psoriasis 11/14/2017  . Muscle twitching 11/14/2017  . Acute bursitis of right shoulder 09/29/2017  . Bilateral hip pain 07/10/2017  . Bronchiectasis without complication ([place]) 44/12/4740  . Irritable larynx syndrome 04/25/2017  . Reflux laryngitis 04/23/2017  . Benign lipomatous neoplasm of skin and subcutaneous tissue of right leg 02/25/2017  . Anemia 02/05/2017  . Fatigue 01/23/2017  . Chronic cough 01/02/2017  . Multiple pulmonary nodules 11/10/2016  . Acute midline low back pain with right-sided sciatica 10/09/2016  . Degenerative arthritis of right knee 06/20/2016  . Degenerative arthritis of left knee 04/10/2016  . Peroneal tendinitis of left lower extremity 09/05/2015  . Recurrent falls 08/08/2015  . Bursitis of left shoulder 07/13/2015  . Morbid obesity due to excess calories ([place])  04/16/2015  . Nonspecific abnormal electrocardiogram (ECG) (EKG) 05/11/2011  . Tenosynovitis of finger 04/22/2011  . Type 2 diabetes, uncontrolled, with neuropathy ([place]) 03/02/2010  . Hyperlipidemia associated with type 2 diabetes mellitus ([place]) 03/02/2010  . Depression with anxiety 03/02/2010  . Essential hypertension 03/02/2010  . Chronic rhinitis 03/02/2010  . Upper airway cough syndrome 03/02/2010  . GERD 03/02/2010   Current Outpatient Medications on File Prior to Visit  Medication Sig Dispense Refill  . acetaminophen (TYLENOL) 325 MG tablet Take 650 mg by mouth every 6 (six) hours as needed.    Marland Kitchen albuterol (VENTOLIN HFA) 108 (90 Base) MCG/ACT inhaler Inhale 1-2 puffs into the lungs every 6 (six) hours as needed for wheezing or shortness of breath. 1 Inhaler 3  . ALPRAZolam (XANAX) 0.5 MG tablet TAKE 1 TABLET BY MOUTH EVERY 8 HOURS IF NEEDED FOR ANXIETY 30 tablet 0  . amLODipine (NORVASC) 10 MG tablet Take 1 tablet (10 mg total) by mouth daily. 90 tablet 1  . aspirin 81 MG tablet Take 81 mg by mouth every morning.     . Calcium Citrate-Vitamin D (CALCIUM + D PO) Take 1 tablet by mouth every morning.    . chlorpheniramine (CHLOR-TRIMETON) 4 MG tablet Take 2 tabs by mouth at bedtime and every 4 hours as needed for drainage/drippy nose/sneezing    . cyclobenzaprine (FLEXERIL) 5 MG tablet Take 1-2 tablets (5-10 mg total) by mouth 3 (three) times daily as needed for muscle spasms. 30 tablet 0  . fluocinonide ointment (LIDEX) 5.95 % Apply 1 application topically as needed.     Marland Kitchen  fluticasone (FLONASE) 50 MCG/ACT nasal spray USE 1 SPRAY IN EACH NOSTRIL TWICE DAILY 48 g 1  . glucose blood (ONE TOUCH ULTRA TEST) test strip 1 each by Other route daily. Use 1 strips to check blood sugar twice a day Ex E11.9 100 each 3  . glyBURIDE (DIABETA) 2.5 MG tablet TAKE 1 TABLET EVERY DAY WITH BREAKFAST 90 tablet 0  . Lancets (ONETOUCH ULTRASOFT) lancets PRN 100 each 3  . losartan-hydrochlorothiazide  (HYZAAR) 100-12.5 MG tablet TAKE 1 TABLET EVERY DAY 90 tablet 1  . lovastatin (MEVACOR) 20 MG tablet TAKE 1 TABLET AT BEDTIME 90 tablet 1  . meloxicam (MOBIC) 7.5 MG tablet Take 1 tablet (7.5 mg total) by mouth daily. 30 tablet 0  . mometasone (ELOCON) 0.1 % ointment     . montelukast (SINGULAIR) 10 MG tablet Take 1 tablet (10 mg total) by mouth at bedtime. 30 tablet 5  . Multiple Vitamins-Minerals (CENTRUM SILVER PO) Take 1 tablet by mouth every morning.    Marland Kitchen omeprazole (PRILOSEC) 40 MG capsule Take 40 mg by mouth daily.    . Potassium Chloride ER 20 MEQ TBCR TAKE 1 TABLET EVERY DAY 90 tablet 3  . Respiratory Therapy Supplies (FLUTTER) DEVI Use as directed 1 each 0  . sertraline (ZOLOFT) 100 MG tablet TAKE 1 TABLET EVERY MORNING 90 tablet 1  . valACYclovir (VALTREX) 1000 MG tablet Take 2 tabs by mouth every 12 hours for one day. 12 tablet 5  . Vitamin D, Ergocalciferol, (DRISDOL) 50000 units CAPS capsule TAKE 1 CAPSULE (50,000 UNITS TOTAL) BY MOUTH EVERY 7 (SEVEN) DAYS. 12 capsule 0   No current facility-administered medications on file prior to visit.    Allergies  Allergen Reactions  . Metformin And Related Diarrhea  . Penicillins Rash    Recent Results (from the past 2160 hour(s))  Comprehensive metabolic panel     Status: Abnormal   Collection Time: 07/06/18 11:37 AM  Result Value Ref Range   Sodium 139 135 - 145 mEq/L   Potassium 3.8 3.5 - 5.1 mEq/L   Chloride 103 96 - 112 mEq/L   CO2 28 19 - 32 mEq/L   Glucose, Bld 111 (H) 70 - 99 mg/dL   BUN 24 (H) 6 - 23 mg/dL   Creatinine, Ser 0.89 0.40 - 1.20 mg/dL   Total Bilirubin 0.3 0.2 - 1.2 mg/dL   Alkaline Phosphatase 69 39 - 117 U/L   AST 21 0 - 37 U/L   ALT 20 0 - 35 U/L   Total Protein 8.3 6.0 - 8.3 g/dL   Albumin 4.3 3.5 - 5.2 g/dL   Calcium 10.4 8.4 - 10.5 mg/dL   GFR 79.89 >60.00 mL/min  Hemoglobin A1c     Status: Abnormal   Collection Time: 07/06/18 11:37 AM  Result Value Ref Range   Hgb A1c MFr Bld 6.7 (H) 4.6 -  6.5 %    Comment: Glycemic Control Guidelines for People with Diabetes:Non Diabetic:  <6%Goal of Therapy: <7%Additional Action Suggested:  >8%   CBC with Differential/Platelet     Status: Abnormal   Collection Time: 07/06/18 11:37 AM  Result Value Ref Range   WBC 11.6 (H) 4.0 - 10.5 K/uL   RBC 4.10 3.87 - 5.11 Mil/uL   Hemoglobin 11.7 (L) 12.0 - 15.0 g/dL   HCT 35.3 (L) 36.0 - 46.0 %   MCV 86.2 78.0 - 100.0 fl   MCHC 33.0 30.0 - 36.0 g/dL   RDW 14.1 11.5 - 15.5 %  Platelets 358.0 150.0 - 400.0 K/uL   Neutrophils Relative % 72.1 43.0 - 77.0 %   Lymphocytes Relative 18.4 12.0 - 46.0 %   Monocytes Relative 7.4 3.0 - 12.0 %   Eosinophils Relative 1.8 0.0 - 5.0 %   Basophils Relative 0.3 0.0 - 3.0 %   Neutro Abs 8.3 (H) 1.4 - 7.7 K/uL   Lymphs Abs 2.1 0.7 - 4.0 K/uL   Monocytes Absolute 0.9 0.1 - 1.0 K/uL   Eosinophils Absolute 0.2 0.0 - 0.7 K/uL   Basophils Absolute 0.0 0.0 - 0.1 K/uL    Objective: General: Patient is awake, alert, and oriented x 3 and in no acute distress.  Integument: Skin is warm, dry and supple bilateral. Nails are short and thickened but well-maintained bilateral.  Mild reactive callus plantar aspect of left lateral foot.  No signs of infection. No open lesions or preulcerative lesions present bilateral. Remaining integument unremarkable.  Vasculature:  Dorsalis Pedis pulse 2/4 bilateral. Posterior Tibial pulse  1/4 bilateral.  Capillary fill time <3 sec 1-5 bilateral. Positive hair growth to the level of the digits. Temperature gradient within normal limits. No varicosities present bilateral. No edema present bilateral.   Neurology: The patient has intact sensation measured with a 5.07/10g Semmes Weinstein Monofilament at all pedal sites bilateral. Vibratory sensation intact bilateral with tuning fork. No Babinski sign present bilateral.   Musculoskeletal:  Asymptomatic pes planus pedal deformities noted bilateral. Muscular strength 5/5 in all lower extremity  muscular groups bilateral without pain on range of motion . No tenderness with calf compression bilateral.  Assessment and Plan: Problem List Items Addressed This Visit    None    Visit Diagnoses    Encounter for comprehensive diabetic foot examination, type 2 diabetes mellitus ([place])    -  Primary   Diabetes mellitus without complication ([place])       Callus of foot          -Examined patient. -Discussed and educated patient on diabetic foot care, especially with  regards to the vascular, neurological and musculoskeletal systems.  -Stressed the importance of good glycemic control and the detriment of not  controlling glucose levels in relation to the foot. -Advised patient good supportive shoes for foot type daily -Patient to return in 1 year for yearly foot exam or sooner if problems or issues arise.  Landis Martins, DPM

## 2018-07-28 NOTE — Patient Instructions (Signed)

## 2018-08-12 ENCOUNTER — Ambulatory Visit: Payer: Medicare Other | Admitting: Internal Medicine

## 2018-08-14 ENCOUNTER — Other Ambulatory Visit: Payer: Self-pay | Admitting: Family Medicine

## 2018-08-30 ENCOUNTER — Other Ambulatory Visit: Payer: Self-pay | Admitting: Internal Medicine

## 2018-08-31 ENCOUNTER — Other Ambulatory Visit: Payer: Self-pay | Admitting: Internal Medicine

## 2018-08-31 MED ORDER — LOVASTATIN 20 MG PO TABS: 20.0000 mg | ORAL_TABLET | Freq: Every day | ORAL | 1 refills | Status: DC

## 2018-08-31 NOTE — Telephone Encounter (Signed)
Copied from [place] [id number]. Topic: Quick Communication - See Telephone Encounter >> Aug 31, 2018  4:15 PM [place]ona Rieger wrote: CRM for notification. See Telephone encounter for: 08/31/18.  Patient needs a refill on lovastatin (MEVACOR) 20 MG tablet, formally was getting from [facility] but would like for it to go [id number] - [place], [address] [place] [address]  .

## 2018-09-01 DIAGNOSIS — H43813 Vitreous degeneration, bilateral: Secondary | ICD-10-CM | POA: Diagnosis not present

## 2018-09-01 DIAGNOSIS — E119 Type 2 diabetes mellitus without complications: Secondary | ICD-10-CM | POA: Diagnosis not present

## 2018-09-01 DIAGNOSIS — H524 Presbyopia: Secondary | ICD-10-CM | POA: Diagnosis not present

## 2018-09-01 DIAGNOSIS — H52221 Regular astigmatism, right eye: Secondary | ICD-10-CM | POA: Diagnosis not present

## 2018-09-01 DIAGNOSIS — H2513 Age-related nuclear cataract, bilateral: Secondary | ICD-10-CM | POA: Diagnosis not present

## 2018-09-01 DIAGNOSIS — H5212 Myopia, left eye: Secondary | ICD-10-CM | POA: Diagnosis not present

## 2018-09-01 LAB — HM DIABETES EYE EXAM

## 2018-09-02 ENCOUNTER — Encounter: Payer: Self-pay | Admitting: Internal Medicine

## 2018-09-04 ENCOUNTER — Telehealth: Payer: Self-pay | Admitting: Internal Medicine

## 2018-09-04 MED ORDER — LOVASTATIN 20 MG PO TABS: 20.0000 mg | ORAL_TABLET | Freq: Every day | ORAL | 1 refills | Status: DC

## 2018-09-04 NOTE — Telephone Encounter (Signed)
Called pharmacy at [facility] [phone] regarding the medication, lovastatin 20 mg. They are stating that they need a prescription for this medication. Please review.

## 2018-09-04 NOTE — Telephone Encounter (Signed)
Copied from [place] [id number]. Topic: General - Other >> Sep 04, 2018  2:45 PM Antonieta Iba C wrote: Reason for CRM: pt called in stating that she spoke with pharmacy and they have not received Rx for lovastatin (MEVACOR) 20 MG tablet. Confirmed pharmacy, pt verified that pharmacy is correct.  Pt would like further assistance with this.

## 2018-09-04 NOTE — Telephone Encounter (Signed)
Medication resent

## 2018-09-14 ENCOUNTER — Other Ambulatory Visit: Payer: Self-pay | Admitting: Internal Medicine

## 2018-09-14 DIAGNOSIS — R05 Cough: Secondary | ICD-10-CM

## 2018-09-14 DIAGNOSIS — R059 Cough, unspecified: Secondary | ICD-10-CM

## 2018-09-14 DIAGNOSIS — J310 Chronic rhinitis: Secondary | ICD-10-CM

## 2018-09-14 NOTE — Telephone Encounter (Signed)
Copied from [place] [id number]. Topic: Quick Communication - Rx Refill/Question >> Sep 14, 2018  8:54 AM Antonieta Iba C wrote: Medication: albuterol (VENTOLIN HFA) 108 (90 Base) MCG/ACT inhaler   Has the patient contacted their pharmacy? Yes  (Agent: If no, request that the patient contact the pharmacy for the refill.) (Agent: If yes, when and what did the pharmacy advise?)  Preferred Pharmacy (with phone number or street name): [id number] - [place], [place] [address] [phone] (Phone) [phone] (Fax)    Agent: Please be advised that RX refills may take up to 3 business days. We ask that you follow-up with your pharmacy.

## 2018-09-14 NOTE — Telephone Encounter (Signed)
Relation to pt: self  Call back number:[phone] Pharmacy: [id number] - [place], [place]         [phone] ([phone] (Fax)  Reason for call:  Patient stats she doesn't use her inhaler often and she's completely out therefore would like Rx to be expedited, please advise

## 2018-09-15 MED ORDER — ALBUTEROL SULFATE HFA 108 (90 BASE) MCG/ACT IN AERS: 1.0000 | INHALATION_SPRAY | Freq: Four times a day (QID) | RESPIRATORY_TRACT | 3 refills | Status: DC | PRN

## 2018-09-15 NOTE — Telephone Encounter (Signed)
Requested medication (s) are due for refill today: yes  Requested medication (s) are on the active medication list: yes  Last refill:  03/18/17  Future visit scheduled: no  Notes to clinic: original prescription wrriten by Wilfred Lacy 03/18/17; does Dr Quay Burow wish to continue this med    Requested Prescriptions  Pending Prescriptions Disp Refills   albuterol (VENTOLIN HFA) 108 (90 Base) MCG/ACT inhaler 1 Inhaler 3    Sig: Inhale 1-2 puffs into the lungs every 6 (six) hours as needed for wheezing or shortness of breath.     Pulmonology:  Beta Agonists Failed - 09/15/2018  6:49 AM      Failed - One inhaler should last at least one month. If the patient is requesting refills earlier, contact the patient to check for uncontrolled symptoms.      Passed - Valid encounter within last 12 months    Recent Outpatient Visits          2 months ago Essential hypertension   [place], Claudina Lick, MD   2 months ago Neck pain   [place], MD   2 months ago Neck pain   [place], [place], DO   2 months ago Trapezius muscle strain, right, initial encounter   [place], MD   5 months ago Acute gout of right hand, unspecified cause   [place], MD      Future Appointments            In 4 months Burns, Claudina Lick, MD [place], [place]   In 4 months  [place], [place]

## 2018-09-28 ENCOUNTER — Other Ambulatory Visit: Payer: Self-pay | Admitting: Internal Medicine

## 2018-09-29 NOTE — Progress Notes (Signed)
Subjective:    Patient ID: Heidi Franklin, female    DOB: [birth year], 72 y.o.   MRN: [id number]  HPI She is here for an acute visit for cold symptoms.  Her symptoms started 3 weeks ago  She is experiencing a persistent cough that is primarily dry, but occasionally she will bring up a very small amount of sputum.  She is some very mild shortness of breath at times.  States postnasal drip.  At this time she denies other cold symptoms including fever, chills, nasal congestion, ear pain, sinus pain, sore throat, wheezing, headaches and lightheadedness.  She was mostly concerned because of the cough has been persistent.  She states the cough is worse in the morning and may have episodes during the day.  She has taken a bottle of delsym, mucinex, flonase  Medications and allergies reviewed with patient and updated if appropriate.  Patient Active Problem List   Diagnosis Date Noted  . Neck pain 07/06/2018  . Strep throat 07/01/2018  . Trapezius muscle strain, right, initial encounter 06/23/2018  . Edema 06/23/2018  . Acute gout 04/08/2018  . Herpes simplex 02/10/2018  . ETD (eustachian tube dysfunction) 02/10/2018  . Peroneal tendinitis, right 01/06/2018  . Psoriasis 11/14/2017  . Muscle twitching 11/14/2017  . Acute bursitis of right shoulder 09/29/2017  . Bilateral hip pain 07/10/2017  . Bronchiectasis without complication ([place]) 61/60/7371  . Irritable larynx syndrome 04/25/2017  . Reflux laryngitis 04/23/2017  . Benign lipomatous neoplasm of skin and subcutaneous tissue of right leg 02/25/2017  . Anemia 02/05/2017  . Fatigue 01/23/2017  . Chronic cough 01/02/2017  . Multiple pulmonary nodules 11/10/2016  . Acute midline low back pain with right-sided sciatica 10/09/2016  . Degenerative arthritis of right knee 06/20/2016  . Degenerative arthritis of left knee 04/10/2016  . Peroneal tendinitis of left lower extremity 09/05/2015  . Recurrent falls 08/08/2015  . Bursitis of left  shoulder 07/13/2015  . Morbid obesity due to excess calories ([place]) 04/16/2015  . Nonspecific abnormal electrocardiogram (ECG) (EKG) 05/11/2011  . Tenosynovitis of finger 04/22/2011  . Type 2 diabetes, uncontrolled, with neuropathy ([place]) 03/02/2010  . Hyperlipidemia associated with type 2 diabetes mellitus ([place]) 03/02/2010  . Depression with anxiety 03/02/2010  . Essential hypertension 03/02/2010  . Chronic rhinitis 03/02/2010  . Upper airway cough syndrome 03/02/2010  . GERD 03/02/2010    Current Outpatient Medications on File Prior to Visit  Medication Sig Dispense Refill  . acetaminophen (TYLENOL) 325 MG tablet Take 650 mg by mouth every 6 (six) hours as needed.    Marland Kitchen albuterol (VENTOLIN HFA) 108 (90 Base) MCG/ACT inhaler Inhale 1-2 puffs into the lungs every 6 (six) hours as needed for wheezing or shortness of breath. 1 Inhaler 3  . ALPRAZolam (XANAX) 0.5 MG tablet TAKE 1 TABLET BY MOUTH EVERY 8 HOURS IF NEEDED FOR ANXIETY 30 tablet 0  . amLODipine (NORVASC) 10 MG tablet Take 1 tablet (10 mg total) by mouth daily. 90 tablet 1  . aspirin 81 MG tablet Take 81 mg by mouth every morning.     . Calcium Citrate-Vitamin D (CALCIUM + D PO) Take 1 tablet by mouth every morning.    . chlorpheniramine (CHLOR-TRIMETON) 4 MG tablet Take 2 tabs by mouth at bedtime and every 4 hours as needed for drainage/drippy nose/sneezing    . fluocinonide ointment (LIDEX) 0.62 % Apply 1 application topically as needed.     . fluticasone (FLONASE) 50 MCG/ACT nasal spray USE 1 SPRAY IN [facility]  NOSTRIL TWICE DAILY 48 g 1  . glucose blood (ONE TOUCH ULTRA TEST) test strip 1 each by Other route daily. Use 1 strips to check blood sugar twice a day Ex E11.9 100 each 3  . glyBURIDE (DIABETA) 2.5 MG tablet TAKE 1 TABLET BY MOUTH EVERY DAY WITH BREAKFAST 90 tablet 1  . Lancets (ONETOUCH ULTRASOFT) lancets PRN 100 each 3  . losartan-hydrochlorothiazide (HYZAAR) 100-12.5 MG tablet TAKE 1 TABLET EVERY DAY 90 tablet 1  .  lovastatin (MEVACOR) 20 MG tablet Take 1 tablet (20 mg total) by mouth at bedtime. 90 tablet 1  . meloxicam (MOBIC) 7.5 MG tablet Take 1 tablet (7.5 mg total) by mouth daily. 30 tablet 0  . mometasone (ELOCON) 0.1 % ointment     . montelukast (SINGULAIR) 10 MG tablet Take 1 tablet (10 mg total) by mouth at bedtime. 30 tablet 5  . Multiple Vitamins-Minerals (CENTRUM SILVER PO) Take 1 tablet by mouth every morning.    Marland Kitchen omeprazole (PRILOSEC) 40 MG capsule Take 40 mg by mouth daily.    . Potassium Chloride ER 20 MEQ TBCR TAKE 1 TABLET EVERY DAY 90 tablet 3  . Respiratory Therapy Supplies (FLUTTER) DEVI Use as directed 1 each 0  . sertraline (ZOLOFT) 100 MG tablet TAKE 1 TABLET IN THE MORNING 90 tablet 1  . valACYclovir (VALTREX) 1000 MG tablet Take 2 tabs by mouth every 12 hours for one day. 12 tablet 5  . Vitamin D, Ergocalciferol, (DRISDOL) 50000 units CAPS capsule TAKE 1 CAPSULE (50,000 UNITS TOTAL) BY MOUTH EVERY 7 (SEVEN) DAYS. 12 capsule 0   No current facility-administered medications on file prior to visit.     Past Medical History:  Diagnosis Date  . ALLERGIC RHINITIS   . Allergy   . Anxiety state, unspecified   . Arthritis   . BRONCHITIS, CHRONIC   . DEPRESSION   . DIABETES MELLITUS, TYPE II   . GERD   . HYPERLIPIDEMIA   . HYPERTENSION   . Psoriasis     Past Surgical History:  Procedure Laterality Date  . ABDOMINAL HYSTERECTOMY  1980  . TRIGGER FINGER RELEASE     Index finger    Social History   Socioeconomic History  . Marital status: Married    Spouse name: Not on file  . Number of children: 0  . Years of education: 34yr colge  . Highest education level: Not on file  Occupational History  . Occupation: Retired   Scientific laboratory technician  . Financial resource strain: Somewhat hard  . Food insecurity:    Worry: Sometimes true    Inability: Sometimes true  . Transportation needs:    Medical: No    Non-medical: No  Tobacco Use  . Smoking status: Never Smoker  .  Smokeless tobacco: Never Used  Substance and Sexual Activity  . Alcohol use: No    Alcohol/week: 0.0 standard drinks  . Drug use: No  . Sexual activity: Not Currently  Lifestyle  . Physical activity:    Days per week: 0 days    Minutes per session: 0 min  . Stress: Very much  Relationships  . Social connections:    Talks on phone: More than three times a week    Gets together: More than three times a week    Attends religious service: More than 4 times per year    Active member of club or organization: Yes    Attends meetings of clubs or organizations: More than 4 times per year  Relationship status: Married  Other Topics Concern  . Not on file  Social History Narrative   Married, lives with spouse-retired from [employer] insurance   Not employed    Drinks coffee occasional, Consumes 1 soda a day     Family History  Problem Relation Age of Onset  . Stroke Mother   . Angina Mother   . Diabetes Father   . Hyperlipidemia Other        Parent  . Hypertension Other        Parent  . Diabetes Sister        x1  . CAD Sister   . Diabetes Brother        x 2  . Colon cancer Neg Hx   . Esophageal cancer Neg Hx   . Rectal cancer Neg Hx   . Stomach cancer Neg Hx   . Pancreatic cancer Neg Hx     Review of Systems  Constitutional: Negative for chills and fever.  HENT: Positive for postnasal drip. Negative for congestion, ear pain, sinus pressure, sinus pain and sore throat (started with a sore throat - resolved).   Respiratory: Positive for cough (occ small amt of sputum) and shortness of breath (mild). Negative for chest tightness and wheezing.   Gastrointestinal: Negative for diarrhea and nausea.  Musculoskeletal: Negative for myalgias.  Neurological: Negative for light-headedness and headaches.       Objective:   Vitals:   09/30/18 1001  BP: 130/62  Pulse: 92  Resp: 18  Temp: 98.2 F (36.8 C)  SpO2: 97%   Filed Weights   09/30/18 1001  Weight: 157 lb 12.8 oz (71.6  kg)   Body mass index is 30.82 kg/m.  Wt Readings from Last 3 Encounters:  09/30/18 157 lb 12.8 oz (71.6 kg)  07/14/18 162 lb 12.8 oz (73.8 kg)  07/06/18 161 lb (73 kg)     Physical Exam GENERAL APPEARANCE: Appears stated age, well appearing, NAD EYES: conjunctiva clear, no icterus HEENT: bilateral tympanic membranes and ear canals normal, oropharynx with no erythema, no thyromegaly, trachea midline, no cervical or supraclavicular lymphadenopathy LUNGS: Clear to auscultation without wheeze or crackles, unlabored breathing, good air entry bilaterally CARDIOVASCULAR: Normal S1,S2 without murmurs, no edema SKIN: warm, dry        Assessment & Plan:   See Problem List for Assessment and Plan of chronic medical problems.

## 2018-09-30 ENCOUNTER — Encounter: Payer: Self-pay | Admitting: Internal Medicine

## 2018-09-30 ENCOUNTER — Ambulatory Visit (INDEPENDENT_AMBULATORY_CARE_PROVIDER_SITE_OTHER): Payer: Medicare Other | Admitting: Internal Medicine

## 2018-09-30 VITALS — BP 130/62 | HR 92 | Temp 98.2°F | Resp 18 | Ht 60.0 in | Wt 157.8 lb

## 2018-09-30 DIAGNOSIS — R05 Cough: Secondary | ICD-10-CM

## 2018-09-30 DIAGNOSIS — R059 Cough, unspecified: Secondary | ICD-10-CM

## 2018-09-30 NOTE — Assessment & Plan Note (Signed)
Cough -occasionally brings up sputum, but mostly dry Likely residual from recent viral infection No other concerning symptoms on exam is normal No need for antibiotics Can continue Mucinex, Flonase, over-the-counter cough medications Continue increase rest and fluids She will call if her symptoms do not continue to improve and resolve

## 2018-09-30 NOTE — Patient Instructions (Signed)
Continue the cough syrup,mucinex and nasal sprays.  Use your inhaler as needed.    Continue increased rest and fluids.    Call if no improvement

## 2018-10-06 ENCOUNTER — Ambulatory Visit: Payer: Self-pay

## 2018-10-06 MED ORDER — HYDROCODONE-HOMATROPINE 5-1.5 MG/5ML PO SYRP: 5.0000 mL | ORAL_SOLUTION | Freq: Three times a day (TID) | ORAL | 0 refills | Status: DC | PRN

## 2018-10-06 NOTE — Telephone Encounter (Signed)
I sent a cough syrup to her pharmacy.

## 2018-10-06 NOTE — Telephone Encounter (Signed)
Pt. Seen 09/30/18 by Dr. Quay Burow. States she still has a non-productive cough and runny nose. Denies fever. Cough wakes her up at night and she is sore from coughing. Has tried Delsym, Mucinex and hot tea. Nothing is helping. Would prefer to not come back in and have to pay another co-pay. Request Dr. Quay Burow call "some medicine in for me." Uses [facility] [address] [facility] [place]. Please advise pt.  Answer Assessment - Initial Assessment Questions 1. ONSET: "When did the cough begin?"      Started 4 weeks ago 2. SEVERITY: "How bad is the cough today?"      Severe  3. RESPIRATORY DISTRESS: "Describe your breathing."      No distress 4. FEVER: "Do you have a fever?" If so, ask: "What is your temperature, how was it measured, and when did it start?"     No 5. HEMOPTYSIS: "Are you coughing up any blood?" If so ask: "How much?" (flecks, streaks, tablespoons, etc.)     No 6. TREATMENT: "What have you done so far to treat the cough?" (e.g., meds, fluids, humidifier)     Mucinex,Delsym, Hot tea 7. CARDIAC HISTORY: "Do you have any history of heart disease?" (e.g., heart attack, congestive heart failure)      HTN 8. LUNG HISTORY: "Do you have any history of lung disease?"  (e.g., pulmonary embolus, asthma, emphysema)     No 9. PE RISK FACTORS: "Do you have a history of blood clots?" (or: recent major surgery, recent prolonged travel, bedridden)     No 10. OTHER SYMPTOMS: "Do you have any other symptoms? (e.g., runny nose, wheezing, chest pain)       Runny nose, occasional wheezing 11. PREGNANCY: "Is there any chance you are pregnant?" "When was your last menstrual period?"       No 12. TRAVEL: "Have you traveled out of the country in the last month?" (e.g., travel history, exposures)       No  Protocols used: COUGH - ACUTE NON-PRODUCTIVE-A-AH

## 2018-10-07 NOTE — Telephone Encounter (Signed)
Pt aware.

## 2018-10-14 ENCOUNTER — Telehealth: Payer: Self-pay | Admitting: Internal Medicine

## 2018-10-14 MED ORDER — POTASSIUM CHLORIDE ER 20 MEQ PO TBCR: 1.0000 | EXTENDED_RELEASE_TABLET | Freq: Every day | ORAL | 1 refills | Status: DC

## 2018-10-14 NOTE — Telephone Encounter (Signed)
Copied from [place] [id number]. Topic: Quick Communication - Rx Refill/Question >> Oct 14, 2018  8:34 AM Oneta Rack wrote:  Medication: Potassium Chloride ER 20 MEQ TBCR, omeprazole (PRILOSEC) 40 MG capsule   Has the patient contacted their pharmacy? Yes   (Agent: If yes, when and what did the pharmacy advise?) pharmacy advised to call PCP office due to refill request already being sent  Preferred Pharmacy (with phone number or street name):  [id number] [place] [place] - [place] [phone] (Phone) [phone] (Fax)  Agent: Please be advised that RX refills may take up to 3 business days. We ask that you follow-up with your pharmacy.

## 2018-10-18 ENCOUNTER — Encounter: Payer: Self-pay | Admitting: Internal Medicine

## 2018-10-23 ENCOUNTER — Ambulatory Visit (INDEPENDENT_AMBULATORY_CARE_PROVIDER_SITE_OTHER): Payer: Medicare Other | Admitting: Family

## 2018-10-23 ENCOUNTER — Ambulatory Visit (INDEPENDENT_AMBULATORY_CARE_PROVIDER_SITE_OTHER)
Admission: RE | Admit: 2018-10-23 | Discharge: 2018-10-23 | Disposition: A | Discharge status: Home / Self Care | Payer: Medicare Other | Source: Ambulatory Visit | Attending: Family | Admitting: Family

## 2018-10-23 ENCOUNTER — Encounter: Payer: Self-pay | Admitting: Family

## 2018-10-23 VITALS — BP 132/70 | HR 85 | Temp 98.7°F | Ht 60.0 in | Wt 155.1 lb

## 2018-10-23 DIAGNOSIS — R0602 Shortness of breath: Secondary | ICD-10-CM | POA: Diagnosis not present

## 2018-10-23 DIAGNOSIS — R059 Cough, unspecified: Secondary | ICD-10-CM

## 2018-10-23 DIAGNOSIS — R05 Cough: Secondary | ICD-10-CM

## 2018-10-23 MED ORDER — AZITHROMYCIN 250 MG PO TABS: ORAL_TABLET | ORAL | 0 refills | Status: DC

## 2018-10-23 MED ORDER — PREDNISONE 20 MG PO TABS: 20.0000 mg | ORAL_TABLET | Freq: Every day | ORAL | 0 refills | Status: DC

## 2018-10-23 NOTE — Progress Notes (Signed)
Heidi Franklin is a 72 y.o. female with the following history as recorded in EpicCare:  Patient Active Problem List   Diagnosis Date Noted  . Neck pain 07/06/2018  . Strep throat 07/01/2018  . Trapezius muscle strain, right, initial encounter 06/23/2018  . Edema 06/23/2018  . Acute gout 04/08/2018  . Herpes simplex 02/10/2018  . ETD (eustachian tube dysfunction) 02/10/2018  . Peroneal tendinitis, right 01/06/2018  . Psoriasis 11/14/2017  . Muscle twitching 11/14/2017  . Acute bursitis of right shoulder 09/29/2017  . Bilateral hip pain 07/10/2017  . Bronchiectasis without complication ([place]) 73/41/9379  . Irritable larynx syndrome 04/25/2017  . Reflux laryngitis 04/23/2017  . Benign lipomatous neoplasm of skin and subcutaneous tissue of right leg 02/25/2017  . Anemia 02/05/2017  . Fatigue 01/23/2017  . Cough 01/02/2017  . Multiple pulmonary nodules 11/10/2016  . Acute midline low back pain with right-sided sciatica 10/09/2016  . Degenerative arthritis of right knee 06/20/2016  . Degenerative arthritis of left knee 04/10/2016  . Peroneal tendinitis of left lower extremity 09/05/2015  . Recurrent falls 08/08/2015  . Bursitis of left shoulder 07/13/2015  . Morbid obesity due to excess calories ([place]) 04/16/2015  . Nonspecific abnormal electrocardiogram (ECG) (EKG) 05/11/2011  . Tenosynovitis of finger 04/22/2011  . Type 2 diabetes, uncontrolled, with neuropathy ([place]) 03/02/2010  . Hyperlipidemia associated with type 2 diabetes mellitus ([place]) 03/02/2010  . Depression with anxiety 03/02/2010  . Essential hypertension 03/02/2010  . Chronic rhinitis 03/02/2010  . Upper airway cough syndrome 03/02/2010  . GERD 03/02/2010    Current Outpatient Medications  Medication Sig Dispense Refill  . acetaminophen (TYLENOL) 325 MG tablet Take 650 mg by mouth every 6 (six) hours as needed.    Marland Kitchen albuterol (VENTOLIN HFA) 108 (90 Base) MCG/ACT inhaler Inhale 1-2 puffs into the lungs every 6 (six)  hours as needed for wheezing or shortness of breath. 1 Inhaler 3  . ALPRAZolam (XANAX) 0.5 MG tablet TAKE 1 TABLET BY MOUTH EVERY 8 HOURS IF NEEDED FOR ANXIETY 30 tablet 0  . amLODipine (NORVASC) 10 MG tablet Take 1 tablet (10 mg total) by mouth daily. 90 tablet 1  . aspirin 81 MG tablet Take 81 mg by mouth every morning.     . Calcium Citrate-Vitamin D (CALCIUM + D PO) Take 1 tablet by mouth every morning.    . chlorpheniramine (CHLOR-TRIMETON) 4 MG tablet Take 2 tabs by mouth at bedtime and every 4 hours as needed for drainage/drippy nose/sneezing    . fluocinonide ointment (LIDEX) 0.24 % Apply 1 application topically as needed.     . fluticasone (FLONASE) 50 MCG/ACT nasal spray USE 1 SPRAY IN EACH NOSTRIL TWICE DAILY 48 g 1  . glucose blood (ONE TOUCH ULTRA TEST) test strip 1 each by Other route daily. Use 1 strips to check blood sugar twice a day Ex E11.9 100 each 3  . glyBURIDE (DIABETA) 2.5 MG tablet TAKE 1 TABLET BY MOUTH EVERY DAY WITH BREAKFAST 90 tablet 1  . HYDROcodone-homatropine (HYCODAN) 5-1.5 MG/5ML syrup Take 5 mLs by mouth every 8 (eight) hours as needed for cough. 120 mL 0  . Lancets (ONETOUCH ULTRASOFT) lancets PRN 100 each 3  . losartan-hydrochlorothiazide (HYZAAR) 100-12.5 MG tablet TAKE 1 TABLET EVERY DAY 90 tablet 1  . lovastatin (MEVACOR) 20 MG tablet Take 1 tablet (20 mg total) by mouth at bedtime. 90 tablet 1  . meloxicam (MOBIC) 7.5 MG tablet Take 1 tablet (7.5 mg total) by mouth daily. 30 tablet 0  .  mometasone (ELOCON) 0.1 % ointment     . montelukast (SINGULAIR) 10 MG tablet Take 1 tablet (10 mg total) by mouth at bedtime. 30 tablet 5  . Multiple Vitamins-Minerals (CENTRUM SILVER PO) Take 1 tablet by mouth every morning.    Marland Kitchen omeprazole (PRILOSEC) 40 MG capsule Take 40 mg by mouth daily.    . Potassium Chloride ER 20 MEQ TBCR Take 1 tablet by mouth daily. 90 tablet 1  . Respiratory Therapy Supplies (FLUTTER) DEVI Use as directed 1 each 0  . sertraline (ZOLOFT) 100  MG tablet TAKE 1 TABLET IN THE MORNING 90 tablet 1  . valACYclovir (VALTREX) 1000 MG tablet Take 2 tabs by mouth every 12 hours for one day. 12 tablet 5  . Vitamin D, Ergocalciferol, (DRISDOL) 50000 units CAPS capsule TAKE 1 CAPSULE (50,000 UNITS TOTAL) BY MOUTH EVERY 7 (SEVEN) DAYS. 12 capsule 0  . azithromycin (ZITHROMAX) 250 MG tablet 2 tabs po qd x 1 day; 1 tablet per day x 4 days; 6 tablet 0  . predniSONE (DELTASONE) 20 MG tablet Take 1 tablet (20 mg total) by mouth daily with breakfast. 5 tablet 0  . valACYclovir (VALTREX) 500 MG tablet Take 500 mg by mouth 2 (two) times daily.  0   No current facility-administered medications for this visit.     Allergies: Metformin and related and Penicillins  Past Medical History:  Diagnosis Date  . ALLERGIC RHINITIS   . Allergy   . Anxiety state, unspecified   . Arthritis   . BRONCHITIS, CHRONIC   . DEPRESSION   . DIABETES MELLITUS, TYPE II   . GERD   . HYPERLIPIDEMIA   . HYPERTENSION   . Psoriasis     Past Surgical History:  Procedure Laterality Date  . ABDOMINAL HYSTERECTOMY  1980  . TRIGGER FINGER RELEASE     Index finger    Family History  Problem Relation Age of Onset  . Stroke Mother   . Angina Mother   . Diabetes Father   . Hyperlipidemia Other        Parent  . Hypertension Other        Parent  . Diabetes Sister        x1  . CAD Sister   . Diabetes Brother        x 2  . Colon cancer Neg Hx   . Esophageal cancer Neg Hx   . Rectal cancer Neg Hx   . Stomach cancer Neg Hx   . Pancreatic cancer Neg Hx     Social History   Tobacco Use  . Smoking status: Never Smoker  . Smokeless tobacco: Never Used  Substance Use Topics  . Alcohol use: No    Alcohol/week: 0.0 standard drinks    Subjective:  Patient presents with concerns for chronic cough; was seen on December 4- thought to be allergic/ viral in nature; complaining of "coughing fits." History of chronic bronchitis; no fever, no chest pain; no relief with Hycodan  or Mucinex; left ear now feels full as well; notes that blood sugars are remaining stable;     Objective:  Vitals:   10/23/18 1106  BP: 132/70  Pulse: 85  Temp: 98.7 F (37.1 C)  TempSrc: Oral  SpO2: 99%  Weight: 155 lb 1.9 oz (70.4 kg)  Height: 5' (1.524 m)    General: Well developed, well nourished, in no acute distress  Skin : Warm and dry.  Head: Normocephalic and atraumatic  Eyes: Sclera and conjunctiva clear;  pupils round and reactive to light; extraocular movements intact  Ears: External normal; wax noted in both ears; tympanic membranes normal  Oropharynx: Pink, supple. No suspicious lesions  Neck: Supple without thyromegaly, adenopathy  Lungs: Respirations unlabored; clear to auscultation bilaterally without wheeze, rales, rhonchi  CVS exam: normal rate and regular rhythm.  Neurologic: Alert and oriented; speech intact; face symmetrical; moves all extremities well; CNII-XII intact without focal deficit   Assessment:  1. Cough     Plan:  Update CXR due to length of time symptoms have been present; Rx for Z-pak and Prednisone; increase fluids, rest and follow-up worse, no better; Can use Debrox OTC to help with ear wax.   No follow-ups on file.  Orders Placed This Encounter  Procedures  . DG Chest 2 View    Standing Status:   Future    Number of Occurrences:   1    Standing Expiration Date:   12/25/2019    Order Specific Question:   Reason for Exam (SYMPTOM  OR DIAGNOSIS REQUIRED)    Answer:   cough    Order Specific Question:   Preferred imaging location?    Answer:   [facility]    Order Specific Question:   Radiology Contrast Protocol - do NOT remove file path    Answer:   [email or web address]    Requested Prescriptions   Signed Prescriptions Disp Refills  . predniSONE (DELTASONE) 20 MG tablet 5 tablet 0    Sig: Take 1 tablet (20 mg total) by mouth daily with breakfast.  . azithromycin (ZITHROMAX) 250 MG tablet 6  tablet 0    Sig: 2 tabs po qd x 1 day; 1 tablet per day x 4 days;

## 2018-10-24 ENCOUNTER — Other Ambulatory Visit: Payer: Self-pay | Admitting: Internal Medicine

## 2018-11-05 ENCOUNTER — Other Ambulatory Visit: Payer: Self-pay | Admitting: Family Medicine

## 2018-11-07 ENCOUNTER — Other Ambulatory Visit: Payer: Self-pay | Admitting: Internal Medicine

## 2018-11-23 ENCOUNTER — Encounter: Payer: Self-pay | Admitting: Internal Medicine

## 2018-11-24 ENCOUNTER — Other Ambulatory Visit: Payer: Self-pay

## 2018-11-24 MED ORDER — ALPRAZOLAM 0.5 MG PO TABS: ORAL_TABLET | ORAL | 0 refills | Status: DC

## 2018-11-24 NOTE — Telephone Encounter (Signed)
Last routine OV was 07/14/18 Last refill 06/08/18 Next appointment 01/14/19

## 2018-11-26 ENCOUNTER — Ambulatory Visit (INDEPENDENT_AMBULATORY_CARE_PROVIDER_SITE_OTHER): Payer: Medicare Other | Admitting: Internal Medicine

## 2018-11-26 ENCOUNTER — Encounter: Payer: Self-pay | Admitting: Internal Medicine

## 2018-11-26 VITALS — BP 118/68 | HR 99 | Temp 98.7°F | Ht 60.0 in | Wt 157.0 lb

## 2018-11-26 DIAGNOSIS — E1140 Type 2 diabetes mellitus with diabetic neuropathy, unspecified: Secondary | ICD-10-CM

## 2018-11-26 DIAGNOSIS — E1165 Type 2 diabetes mellitus with hyperglycemia: Secondary | ICD-10-CM | POA: Diagnosis not present

## 2018-11-26 DIAGNOSIS — R05 Cough: Secondary | ICD-10-CM

## 2018-11-26 DIAGNOSIS — I10 Essential (primary) hypertension: Secondary | ICD-10-CM | POA: Diagnosis not present

## 2018-11-26 DIAGNOSIS — IMO0002 Reserved for concepts with insufficient information to code with codable children: Secondary | ICD-10-CM

## 2018-11-26 DIAGNOSIS — R059 Cough, unspecified: Secondary | ICD-10-CM

## 2018-11-26 MED ORDER — PREDNISONE 20 MG PO TABS: 20.0000 mg | ORAL_TABLET | Freq: Every day | ORAL | 0 refills | Status: DC

## 2018-11-26 MED ORDER — FLUTICASONE-SALMETEROL 500-50 MCG/DOSE IN AEPB: 1.0000 | INHALATION_SPRAY | Freq: Two times a day (BID) | RESPIRATORY_TRACT | 11 refills | Status: DC

## 2018-11-26 NOTE — Assessment & Plan Note (Signed)
stable overall by history and exam, recent data reviewed with pt, and pt to continue medical treatment as before,  to f/u any worsening symptoms or concerns  

## 2018-11-26 NOTE — Assessment & Plan Note (Signed)
Etiology unclear, recent cxr c/w chronic bronchitis vs asthma changes, needs refill albuterol, ok to add high dose advair generic for hopeful better control and decreased cough,  to f/u any worsening symptoms or concerns

## 2018-11-26 NOTE — Progress Notes (Signed)
Subjective:    Patient ID: Heidi Franklin, female    DOB: 1946/02/23, 73 y.o.   MRN: [id number]  HPI  Here to f/u with chronic cough - has been Followed in Pulmonary clinic/ [facility]/ [place]  For upper airway cough syndrome; CT sinus 11/08/11>>Negative paranasal sinuses.  Rightward nasal septal deviation and possible rhinitis. 01/13/12>>  RAST negative, IgE 22.7 PFT 11/07/11>>FEV1 1.79 (102%),  Ratio 82, TLC 4.18 (104%), DLCO 69%, positive BD only in terms of fef 25/75 -med calendar 11/23/2012 , 03/18/2013 , 08/26/2013 , 12/06/2013, 06/21/2014 > not keeping up with it ,redone  06/11/2016  -methacholine challenge test  12/24/13 > neg  - try off singulair 12/31/2013 > no worse  - added neurontin 100 mg qid  05/20/14 but did not take consistently as of 06/30/2014 > increased to 100 qid 08/23/2014 improved then stopped it and flared spirng of 2016  - 07/05/2015 increase neurontin 300 tid and added elavil 10 mg at hs > not clear she did this at f/u ov 08/03/2015  - added flutter valve 08/03/2015  - neurontin/ultram Madelin Headings stopped 07/2015 due to falls > no worse 12/04/2015 ; Was seen per Dr Quay Burow and Jodi Mourning NP recently with addition of nasal streoid, mucinex cough syrup, delsym, and hydrocodone cough med, then tried zpack and prednisone, which did seem to help somewhat for a short time.  CXR c/w chroncic bronchitis vs asthma.      Past Medical History:  Diagnosis Date  . ALLERGIC RHINITIS   . Allergy   . Anxiety state, unspecified   . Arthritis   . BRONCHITIS, CHRONIC   . DEPRESSION   . DIABETES MELLITUS, TYPE II   . GERD   . HYPERLIPIDEMIA   . HYPERTENSION   . Psoriasis    Past Surgical History:  Procedure Laterality Date  . ABDOMINAL HYSTERECTOMY  1980  . TRIGGER FINGER RELEASE     Index finger    reports that she has never smoked. She has never used smokeless tobacco. She reports that she does not drink alcohol or use drugs. family history includes Angina in her mother; CAD in her  sister; Diabetes in her brother, father, and sister; Hyperlipidemia in an other family member; Hypertension in an other family member; Stroke in her mother. Allergies  Allergen Reactions  . Metformin And Related Diarrhea  . Penicillins Rash   Current Outpatient Medications on File Prior to Visit  Medication Sig Dispense Refill  . acetaminophen (TYLENOL) 325 MG tablet Take 650 mg by mouth every 6 (six) hours as needed.    Marland Kitchen albuterol (VENTOLIN HFA) 108 (90 Base) MCG/ACT inhaler Inhale 1-2 puffs into the lungs every 6 (six) hours as needed for wheezing or shortness of breath. 1 Inhaler 3  . ALPRAZolam (XANAX) 0.5 MG tablet Take 1 tablet by mouth every 8 hours if needed for anxiety. 30 tablet 0  . amLODipine (NORVASC) 10 MG tablet TAKE 1 TABLET BY MOUTH EVERY DAY 90 tablet 1  . aspirin 81 MG tablet Take 81 mg by mouth every morning.     Marland Kitchen azithromycin (ZITHROMAX) 250 MG tablet 2 tabs po qd x 1 day; 1 tablet per day x 4 days; 6 tablet 0  . Calcium Citrate-Vitamin D (CALCIUM + D PO) Take 1 tablet by mouth every morning.    . chlorpheniramine (CHLOR-TRIMETON) 4 MG tablet Take 2 tabs by mouth at bedtime and every 4 hours as needed for drainage/drippy nose/sneezing    . fluocinonide ointment (LIDEX) 0.05 %  Apply 1 application topically as needed.     . fluticasone (FLONASE) 50 MCG/ACT nasal spray USE 1 SPRAY IN EACH NOSTRIL TWICE DAILY 48 g 1  . glucose blood (ONE TOUCH ULTRA TEST) test strip 1 each by Other route daily. Use 1 strips to check blood sugar twice a day Ex E11.9 100 each 3  . glyBURIDE (DIABETA) 2.5 MG tablet TAKE 1 TABLET BY MOUTH EVERY DAY WITH BREAKFAST 90 tablet 1  . HYDROcodone-homatropine (HYCODAN) 5-1.5 MG/5ML syrup Take 5 mLs by mouth every 8 (eight) hours as needed for cough. 120 mL 0  . Lancets (ONETOUCH ULTRASOFT) lancets PRN 100 each 3  . losartan-hydrochlorothiazide (HYZAAR) 100-12.5 MG tablet TAKE 1 TABLET EVERY DAY 90 tablet 0  . lovastatin (MEVACOR) 20 MG tablet Take 1  tablet (20 mg total) by mouth at bedtime. 90 tablet 1  . meloxicam (MOBIC) 7.5 MG tablet Take 1 tablet (7.5 mg total) by mouth daily. 30 tablet 0  . mometasone (ELOCON) 0.1 % ointment     . montelukast (SINGULAIR) 10 MG tablet Take 1 tablet (10 mg total) by mouth at bedtime. 30 tablet 5  . Multiple Vitamins-Minerals (CENTRUM SILVER PO) Take 1 tablet by mouth every morning.    Marland Kitchen omeprazole (PRILOSEC) 40 MG capsule Take 40 mg by mouth daily.    . Potassium Chloride ER 20 MEQ TBCR Take 1 tablet by mouth daily. 90 tablet 1  . predniSONE (DELTASONE) 20 MG tablet Take 1 tablet (20 mg total) by mouth daily with breakfast. 5 tablet 0  . Respiratory Therapy Supplies (FLUTTER) DEVI Use as directed 1 each 0  . sertraline (ZOLOFT) 100 MG tablet TAKE 1 TABLET IN THE MORNING 90 tablet 1  . valACYclovir (VALTREX) 1000 MG tablet Take 2 tabs by mouth every 12 hours for one day. 12 tablet 5  . valACYclovir (VALTREX) 500 MG tablet Take 500 mg by mouth 2 (two) times daily.  0  . Vitamin D, Ergocalciferol, (DRISDOL) 1.25 MG (50000 UT) CAPS capsule TAKE 1 CAPSULE (50,000 UNITS TOTAL) BY MOUTH EVERY 7 (SEVEN) DAYS. 12 capsule 0   No current facility-administered medications on file prior to visit.    Review of Systems  Constitutional: Negative for other unusual diaphoresis or sweats HENT: Negative for ear discharge or swelling Eyes: Negative for other worsening visual disturbances Respiratory: Negative for stridor or other swelling  Gastrointestinal: Negative for worsening distension or other blood Genitourinary: Negative for retention or other urinary change Musculoskeletal: Negative for other MSK pain or swelling Skin: Negative for color change or other new lesions Neurological: Negative for worsening tremors and other numbness  Psychiatric/Behavioral: Negative for worsening agitation or other fatigue All other system neg per pt    Objective:   Physical Exam BP 118/68   Pulse 99   Temp 98.7 F (37.1 C)  (Oral)   Ht 5' (1.524 m)   Wt 157 lb (71.2 kg)   SpO2 97%   BMI 30.66 kg/m  VS noted,  Constitutional: Pt appears in NAD HENT: Head: NCAT.  Right Ear: External ear normal.  Left Ear: External ear normal.  Eyes: . Pupils are equal, round, and reactive to light. Conjunctivae and EOM are normal Nose: without d/c or deformity Neck: Neck supple. Gross normal ROM Cardiovascular: Normal rate and regular rhythm.   Pulmonary/Chest: Effort normal and breath sounds decreased without rales or wheezing.  Neurological: Pt is alert. At baseline orientation, motor grossly intact Skin: Skin is warm. No rashes, other new lesions, no  LE edema Psychiatric: Pt behavior is normal without agitation  No other exam findings Lab Results  Component Value Date   WBC 11.6 (H) 07/06/2018   HGB 11.7 (L) 07/06/2018   HCT 35.3 (L) 07/06/2018   PLT 358.0 07/06/2018   GLUCOSE 111 (H) 07/06/2018   CHOL 149 02/10/2018   TRIG 155.0 (H) 02/10/2018   HDL 33.50 (L) 02/10/2018   LDLDIRECT 155.1 02/09/2013   LDLCALC 84 02/10/2018   ALT 20 07/06/2018   AST 21 07/06/2018   NA 139 07/06/2018   K 3.8 07/06/2018   CL 103 07/06/2018   CREATININE 0.89 07/06/2018   BUN 24 (H) 07/06/2018   CO2 28 07/06/2018   TSH 2.85 02/10/2018   HGBA1C 6.7 (H) 07/06/2018   MICROALBUR 1.6 08/08/2016   10/23/18 CLINICAL DATA:  5-6 week history of nonproductive cough, chest congestion and shortness of breath. Current history of diabetes and hypertension.  EXAM: CHEST - 2 VIEW  COMPARISON:  High-resolution CT chest 05/01/2017. Chest x-rays 02/21/2017 and earlier.  FINDINGS: Cardiomediastinal silhouette unremarkable and unchanged. Mildly prominent bronchovascular markings diffusely and mild central peribronchial thickening, unchanged. Lungs otherwise clear. No localized airspace consolidation. No pleural effusions. No pneumothorax. Normal pulmonary vascularity. Visualized bony thorax intact.  IMPRESSION: Stable mild  changes of chronic bronchitis and/or asthma. No acute cardiopulmonary disease.   Electronically Signed   By: Evangeline Dakin M.D.   On: 10/23/2018 12:16 .    Assessment & Plan:

## 2018-11-26 NOTE — Patient Instructions (Signed)
Please take all new medication as prescribed - the short course of prednisone, and start the advair 500/50 - 1 puff twice per day  Please continue all other medications as before, and refills have been done if requested.  Please have the pharmacy call with any other refills you may need.  Please keep your appointments with your specialists as you may have planned

## 2018-12-07 ENCOUNTER — Other Ambulatory Visit: Payer: Self-pay | Admitting: Internal Medicine

## 2018-12-07 MED ORDER — OMEPRAZOLE 40 MG PO CPDR: 40.0000 mg | DELAYED_RELEASE_CAPSULE | Freq: Every day | ORAL | 1 refills | Status: DC

## 2018-12-07 NOTE — Telephone Encounter (Signed)
Requested medication (s) are due for refill today: Yes  Requested medication (s) are on the active medication list: Yes  Last refill:  3//2019  Future visit scheduled: Yes  Notes to clinic:  Unable to refill, last refilled by historical provider     Requested Prescriptions  Pending Prescriptions Disp Refills   omeprazole (PRILOSEC) 40 MG capsule      Sig: Take 1 capsule (40 mg total) by mouth daily.     Gastroenterology: Proton Pump Inhibitors Passed - 12/07/2018  2:18 PM      Passed - Valid encounter within last 12 months    Recent Outpatient Visits          1 week ago Cough   [place], Hunt Oris, MD   1 month ago Cough   [place], Marvis Repress, FNP   2 months ago Cough   [place], Claudina Lick, MD   4 months ago Essential hypertension   [place], Claudina Lick, MD   5 months ago Neck pain   [place], MD      Future Appointments            In 1 month [place], Claudina Lick, MD [place], [place]   In 1 month  [place], [place]

## 2018-12-07 NOTE — Telephone Encounter (Signed)
Copied from [place] [id number]. Topic: Quick Communication - Rx Refill/Question >> Dec 07, 2018 12:02 PM Judyann Munson wrote: Medication: omeprazole (PRILOSEC) 40 MG capsule  Has the patient contacted their pharmacy?yes   Preferred Pharmacy (with phone number or street name): [id number] - [place], [place] [phone] (Phone) [phone] (Fax)    Agent: Please be advised that RX refills may take up to 3 business days. We ask that you follow-up with your pharmacy.

## 2018-12-15 ENCOUNTER — Telehealth: Payer: Self-pay

## 2018-12-15 NOTE — Telephone Encounter (Signed)
Copied from [place] #[id number]. Topic: General - Other >> Dec 15, 2018  1:08 PM Windy Kalata wrote:  Reason for CRM: Received a letter from [employer] and they will no longer cover the glyBURIDE (DIABETA) 2.5 MG tablet it is not in the formulary. Change to another drug or request that she meets medical necessity. The 2 drugs they recommended were,  Glimepirde, Glipizide.  They did supply her a 1 month supply until she gets this figured out. Their number is [phone]   Best call back is [phone]

## 2018-12-15 NOTE — Telephone Encounter (Signed)
Spoke with pt and she advised that her medication has been approved until 11/2019. Disregard message below.

## 2018-12-17 DIAGNOSIS — L408 Other psoriasis: Secondary | ICD-10-CM | POA: Diagnosis not present

## 2018-12-17 DIAGNOSIS — L304 Erythema intertrigo: Secondary | ICD-10-CM | POA: Diagnosis not present

## 2018-12-22 DIAGNOSIS — Z1231 Encounter for screening mammogram for malignant neoplasm of breast: Secondary | ICD-10-CM | POA: Diagnosis not present

## 2018-12-22 LAB — HM MAMMOGRAPHY

## 2018-12-29 ENCOUNTER — Encounter: Payer: Self-pay | Admitting: Internal Medicine

## 2018-12-30 ENCOUNTER — Ambulatory Visit: Payer: Medicare Other | Admitting: Internal Medicine

## 2019-01-14 ENCOUNTER — Ambulatory Visit: Payer: Medicare Other | Admitting: Internal Medicine

## 2019-01-20 ENCOUNTER — Telehealth: Payer: Self-pay | Admitting: *Deleted

## 2019-01-20 NOTE — Telephone Encounter (Signed)
Called patient to inform them the nurse needs to either convert their upcoming AWV to a virtual visit or reschedule the visit out into the future due to covid-19 safety measures. Patient stated she would like to reschedule. Appointments were set up for both AWV and PCP on 03/26/19.

## 2019-01-21 ENCOUNTER — Ambulatory Visit: Payer: Medicare Other | Admitting: Internal Medicine

## 2019-01-28 ENCOUNTER — Ambulatory Visit: Payer: Medicare Other

## 2019-02-06 ENCOUNTER — Other Ambulatory Visit: Payer: Self-pay | Admitting: Internal Medicine

## 2019-02-20 ENCOUNTER — Other Ambulatory Visit: Payer: Self-pay | Admitting: Internal Medicine

## 2019-02-25 ENCOUNTER — Encounter: Payer: Self-pay | Admitting: Internal Medicine

## 2019-02-25 ENCOUNTER — Ambulatory Visit (INDEPENDENT_AMBULATORY_CARE_PROVIDER_SITE_OTHER): Payer: Medicare Other | Admitting: Internal Medicine

## 2019-02-25 DIAGNOSIS — E1140 Type 2 diabetes mellitus with diabetic neuropathy, unspecified: Secondary | ICD-10-CM

## 2019-02-25 DIAGNOSIS — R062 Wheezing: Secondary | ICD-10-CM

## 2019-02-25 DIAGNOSIS — R05 Cough: Secondary | ICD-10-CM

## 2019-02-25 DIAGNOSIS — R059 Cough, unspecified: Secondary | ICD-10-CM

## 2019-02-25 DIAGNOSIS — E1165 Type 2 diabetes mellitus with hyperglycemia: Secondary | ICD-10-CM

## 2019-02-25 DIAGNOSIS — IMO0002 Reserved for concepts with insufficient information to code with codable children: Secondary | ICD-10-CM

## 2019-02-25 MED ORDER — AZITHROMYCIN 250 MG PO TABS: ORAL_TABLET | ORAL | 1 refills | Status: DC

## 2019-02-25 MED ORDER — PREDNISONE 10 MG PO TABS: 20.0000 mg | ORAL_TABLET | Freq: Every day | ORAL | 0 refills | Status: AC

## 2019-02-25 MED ORDER — HYDROCODONE-HOMATROPINE 5-1.5 MG/5ML PO SYRP: 5.0000 mL | ORAL_SOLUTION | Freq: Three times a day (TID) | ORAL | 0 refills | Status: DC | PRN

## 2019-02-25 NOTE — Progress Notes (Signed)
Patient ID: Heidi Franklin, female   DOB: 1946/04/25, 73 y.o.   MRN: [id number]  Virtual Visit via Video Note  I connected with [name] on 02/25/19 at  9:20 AM EDT by a video enabled telemedicine application and verified that I am speaking with the correct person using two identifiers.  Location: Patient: at home, husband present in background Provider: office   I discussed the limitations of evaluation and management by telemedicine and the availability of in person appointments. The patient expressed understanding and agreed to proceed.  History of Present Illness: Here with acute onset mild to mod 3 days ST, HA, general weakness and malaise, with prod cough greenish sputum, but Pt denies chest pain, increased sob or doe, wheezing, orthopnea, PND, increased LE swelling, palpitations, dizziness or syncope, except for mild wheezing, sob with coughing worse in the past day.  Pt denies new neurological symptoms such as new headache, or facial or extremity weakness or numbness   Pt denies polydipsia, polyuria Past Medical History:  Diagnosis Date  . ALLERGIC RHINITIS   . Allergy   . Anxiety state, unspecified   . Arthritis   . BRONCHITIS, CHRONIC   . DEPRESSION   . DIABETES MELLITUS, TYPE II   . GERD   . HYPERLIPIDEMIA   . HYPERTENSION   . Psoriasis    Past Surgical History:  Procedure Laterality Date  . ABDOMINAL HYSTERECTOMY  1980  . TRIGGER FINGER RELEASE     Index finger    reports that she has never smoked. She has never used smokeless tobacco. She reports that she does not drink alcohol or use drugs. family history includes Angina in her mother; CAD in her sister; Diabetes in her brother, father, and sister; Hyperlipidemia in an other family member; Hypertension in an other family member; Stroke in her mother. Allergies  Allergen Reactions  . Metformin And Related Diarrhea  . Penicillins Rash   Current Outpatient Medications on File Prior to Visit  Medication Sig Dispense  Refill  . acetaminophen (TYLENOL) 325 MG tablet Take 650 mg by mouth every 6 (six) hours as needed.    Marland Kitchen albuterol (VENTOLIN HFA) 108 (90 Base) MCG/ACT inhaler Inhale 1-2 puffs into the lungs every 6 (six) hours as needed for wheezing or shortness of breath. 1 Inhaler 3  . ALPRAZolam (XANAX) 0.5 MG tablet Take 1 tablet by mouth every 8 hours if needed for anxiety. 30 tablet 0  . amLODipine (NORVASC) 10 MG tablet TAKE 1 TABLET BY MOUTH EVERY DAY 90 tablet 1  . aspirin 81 MG tablet Take 81 mg by mouth every morning.     . Calcium Citrate-Vitamin D (CALCIUM + D PO) Take 1 tablet by mouth every morning.    . chlorpheniramine (CHLOR-TRIMETON) 4 MG tablet Take 2 tabs by mouth at bedtime and every 4 hours as needed for drainage/drippy nose/sneezing    . fluocinonide ointment (LIDEX) 2.83 % Apply 1 application topically as needed.     . fluticasone (FLONASE) 50 MCG/ACT nasal spray USE 1 SPRAY IN EACH NOSTRIL TWICE DAILY 48 g 1  . Fluticasone-Salmeterol (ADVAIR DISKUS) 500-50 MCG/DOSE AEPB Inhale 1 puff into the lungs 2 (two) times daily. 60 each 11  . glucose blood (ONE TOUCH ULTRA TEST) test strip 1 each by Other route daily. Use 1 strips to check blood sugar twice a day Ex E11.9 100 each 3  . glyBURIDE (DIABETA) 2.5 MG tablet TAKE 1 TABLET BY MOUTH EVERY DAY WITH BREAKFAST 90 tablet 1  .  hydrochlorothiazide (HYDRODIURIL) 12.5 MG tablet Take 1 tablet (12.5 mg total) by mouth daily. Overdue for follow-up appt must see provider for refills 30 tablet 0  . Lancets (ONETOUCH ULTRASOFT) lancets PRN 100 each 3  . losartan (COZAAR) 100 MG tablet TAKE 1 TABLET BY MOUTH EVERY DAY 90 tablet 1  . losartan-hydrochlorothiazide (HYZAAR) 100-12.5 MG tablet TAKE 1 TABLET EVERY DAY 90 tablet 0  . lovastatin (MEVACOR) 20 MG tablet Take 1 tablet (20 mg total) by mouth at bedtime. 90 tablet 1  . meloxicam (MOBIC) 7.5 MG tablet Take 1 tablet (7.5 mg total) by mouth daily. 30 tablet 0  . mometasone (ELOCON) 0.1 % ointment      . montelukast (SINGULAIR) 10 MG tablet Take 1 tablet (10 mg total) by mouth at bedtime. 30 tablet 5  . Multiple Vitamins-Minerals (CENTRUM SILVER PO) Take 1 tablet by mouth every morning.    Marland Kitchen omeprazole (PRILOSEC) 40 MG capsule Take 1 capsule (40 mg total) by mouth daily. 90 capsule 1  . Potassium Chloride ER 20 MEQ TBCR Take 1 tablet by mouth daily. 90 tablet 1  . Respiratory Therapy Supplies (FLUTTER) DEVI Use as directed 1 each 0  . sertraline (ZOLOFT) 100 MG tablet TAKE 1 TABLET IN THE MORNING 90 tablet 1  . valACYclovir (VALTREX) 1000 MG tablet Take 2 tabs by mouth every 12 hours for one day. 12 tablet 5  . valACYclovir (VALTREX) 500 MG tablet Take 500 mg by mouth 2 (two) times daily.  0  . Vitamin D, Ergocalciferol, (DRISDOL) 1.25 MG (50000 UT) CAPS capsule TAKE 1 CAPSULE (50,000 UNITS TOTAL) BY MOUTH EVERY 7 (SEVEN) DAYS. 12 capsule 0   No current facility-administered medications on file prior to visit.       Observations/Objective: Alert, mild ill appearing, hoarseness and frequent coughing, mild left maxillary sinus swelling and tender per pt, resps normal, cn 2-12 intact, moves all 4s, appropriate mood and affect Lab Results  Component Value Date   WBC 11.6 (H) 07/06/2018   HGB 11.7 (L) 07/06/2018   HCT 35.3 (L) 07/06/2018   PLT 358.0 07/06/2018   GLUCOSE 111 (H) 07/06/2018   CHOL 149 02/10/2018   TRIG 155.0 (H) 02/10/2018   HDL 33.50 (L) 02/10/2018   LDLDIRECT 155.1 02/09/2013   LDLCALC 84 02/10/2018   ALT 20 07/06/2018   AST 21 07/06/2018   NA 139 07/06/2018   K 3.8 07/06/2018   CL 103 07/06/2018   CREATININE 0.89 07/06/2018   BUN 24 (H) 07/06/2018   CO2 28 07/06/2018   TSH 2.85 02/10/2018   HGBA1C 6.7 (H) 07/06/2018   MICROALBUR 1.6 08/08/2016   Assessment and Plan: See notes  Follow Up Instructions: See notes   I discussed the assessment and treatment plan with the patient. The patient was provided an opportunity to ask questions and all were answered.  The patient agreed with the plan and demonstrated an understanding of the instructions.   The patient was advised to call back or seek an in-person evaluation if the symptoms worsen or if the condition fails to improve as anticipated.   Cathlean Cower, MD

## 2019-02-25 NOTE — Assessment & Plan Note (Signed)
stable overall by history and exam, recent data reviewed with pt, and pt to continue medical treatment as before,  to f/u any worsening symptoms or concerns  

## 2019-02-25 NOTE — Assessment & Plan Note (Signed)
/  Mild to mod, for predpac asd,  to f/u any worsening symptoms or concerns 

## 2019-02-25 NOTE — Patient Instructions (Signed)
Please take all new medication as prescribed  - the antibiotic, cough medicine, and prednisone  Please continue all other medications as before, and refills have been done if requested.  Please have the pharmacy call with any other refills you may need.  Please continue your efforts at being more active, low cholesterol diabetic diet, and weight control.  Please keep your appointments with your specialists as you may have planned

## 2019-02-25 NOTE — Assessment & Plan Note (Signed)
Mild to mod, for antibx course, cough med prn, to f/u any worsening symptoms or concerns 

## 2019-03-01 ENCOUNTER — Telehealth: Payer: Self-pay

## 2019-03-01 NOTE — Telephone Encounter (Signed)
Pt aware of response.  

## 2019-03-01 NOTE — Telephone Encounter (Signed)
Yes, ok to take the flexeril

## 2019-03-01 NOTE — Telephone Encounter (Signed)
Copied from [place] [id number]. Topic: General - Other >> Mar 01, 2019 10:24 AM Antonieta Iba C wrote: Reason for CRM: pt called in to be advised. Pt says that she has had a kink in her neck for 3 days. Pt says that she has a expired Rx for cyclobenzaprine (FLEXERIL) 5 MG tablet. Pt would like to be advised further on if it is okay to take medication?    CB: [phone]

## 2019-03-01 NOTE — Telephone Encounter (Signed)
Patient has a rx for flexeril left from the end of last year in September. Did not finish the prescription. It is not expired yet. Wants to know if she can take this for her neck pain. States she feels like she is having muscle spasms. She was just seen by Dr. Jenny Reichmann and did not want to do another virtual if she did not have to. Did not get seen for this issue.

## 2019-03-02 ENCOUNTER — Other Ambulatory Visit: Payer: Self-pay | Admitting: Internal Medicine

## 2019-03-03 ENCOUNTER — Ambulatory Visit (INDEPENDENT_AMBULATORY_CARE_PROVIDER_SITE_OTHER): Payer: Medicare Other | Admitting: Internal Medicine

## 2019-03-03 ENCOUNTER — Encounter: Payer: Self-pay | Admitting: Internal Medicine

## 2019-03-03 ENCOUNTER — Ambulatory Visit: Payer: Self-pay | Admitting: *Deleted

## 2019-03-03 DIAGNOSIS — R509 Fever, unspecified: Secondary | ICD-10-CM

## 2019-03-03 DIAGNOSIS — M542 Cervicalgia: Secondary | ICD-10-CM

## 2019-03-03 MED ORDER — CYCLOBENZAPRINE HCL 5 MG PO TABS: 5.0000 mg | ORAL_TABLET | Freq: Three times a day (TID) | ORAL | 0 refills | Status: DC | PRN

## 2019-03-03 MED ORDER — PREDNISONE 20 MG PO TABS: 40.0000 mg | ORAL_TABLET | Freq: Every day | ORAL | 0 refills | Status: DC

## 2019-03-03 NOTE — Assessment & Plan Note (Signed)
Muscle spasm/strain with radiculopathy Continue flexeril  - new rx sent Prednisone 40 mg qd x 5 days Ice/heat Tylenol Can try gabapentin if no improvement She will let me know if there is no improvement

## 2019-03-03 NOTE — Progress Notes (Signed)
Virtual Visit via Video Note  I connected with Heidi Franklin on 03/03/19 at  8:45 AM EDT by a video enabled telemedicine application and verified that I am speaking with the correct person using two identifiers.   I discussed the limitations of evaluation and management by telemedicine and the availability of in person appointments. The patient expressed understanding and agreed to proceed.  The patient is currently at home and I am in the office.    No referring provider.    History of Present Illness: This is an acute visit for neck pain and fever.  She woke up with left sided neck pain a couple of days ago.  She states the pain is radiating down her left arm to her elbow.  She denies any numbness/tingling in the arm and denies any weakness in the arm or hand.  She denies headaches.  She has tried tylenol arthritis, advil, ice/heat and flexeril she had at home.  Nothing helped that much.  She is wondering what else she can do.   She has felt warm and cold and took her temp and yesterday it was 100.3 and this morning 100.1.  She was seen by Dr Jenny Reichmann on 4/30 for a URI and prescribed a zpak, prednisone and cough syrup.  She states her cough is better - it is dry.  She denies other cold symptoms.     Review of Systems  Constitutional: Positive for chills and fever (100.3, 100.1).  HENT: Negative for congestion, ear pain, sinus pain and sore throat.   Respiratory: Positive for cough (improved - dry). Negative for shortness of breath and wheezing.   Gastrointestinal: Negative for abdominal pain, diarrhea and nausea.  Genitourinary: Negative for dysuria, frequency and hematuria.  Musculoskeletal: Positive for neck pain. Negative for myalgias.  Skin: Negative for rash.  Neurological: Negative for tingling (in left arm), weakness (no weakness or left arm or hand) and headaches.     Social History   Socioeconomic History  . Marital status: Married    Spouse name: Not on file  . Number of  children: 0  . Years of education: 68yr colge  . Highest education level: Not on file  Occupational History  . Occupation: Retired   Scientific laboratory technician  . Financial resource strain: Somewhat hard  . Food insecurity:    Worry: Sometimes true    Inability: Sometimes true  . Transportation needs:    Medical: No    Non-medical: No  Tobacco Use  . Smoking status: Never Smoker  . Smokeless tobacco: Never Used  Substance and Sexual Activity  . Alcohol use: No    Alcohol/week: 0.0 standard drinks  . Drug use: No  . Sexual activity: Not Currently  Lifestyle  . Physical activity:    Days per week: 0 days    Minutes per session: 0 min  . Stress: Very much  Relationships  . Social connections:    Talks on phone: More than three times a week    Gets together: More than three times a week    Attends religious service: More than 4 times per year    Active member of club or organization: Yes    Attends meetings of clubs or organizations: More than 4 times per year    Relationship status: Married  Other Topics Concern  . Not on file  Social History Narrative   Married, lives with spouse-retired from [employer] insurance   Not employed    Drinks coffee occasional, Consumes 1 soda a  day      Observations/Objective: Appears well in NAD In obvious discomfort from neck, especially with changes in position.   Assessment and Plan:  See Problem List for Assessment and Plan of chronic medical problems.   Follow Up Instructions:    I discussed the assessment and treatment plan with the patient. The patient was provided an opportunity to ask questions and all were answered. The patient agreed with the plan and demonstrated an understanding of the instructions.   The patient was advised to call back or seek an in-person evaluation if the symptoms worsen or if the condition fails to improve as anticipated.    Binnie Rail, MD

## 2019-03-03 NOTE — Telephone Encounter (Signed)
Patient is calling with neck pain. Patient had chronic cough and is being treated for that. Patient started having pain in neck radiating into shoulder and arm on left and some in to right.. Patient also has temperature of 100.1 this morning.  Patient finished her medications given for cough. Patient reports the cough has improved- but it hurts the neck and shoulders to cough.Patient has deloped a temperature. Call to office to get patient set up with an appointment to review new symptoms.  Reason for Disposition . [1] SEVERE neck pain (e.g., excruciating, unable to do any normal activities) AND [2] not improved after 2 hours of pain medicine  Answer Assessment - Initial Assessment Questions 1. ONSET: "When did the pain begin?"      4 days ago 2. LOCATION: "Where does it hurt?"      left side of neck and shoulder- patient feels throbbing 3. PATTERN "Does the pain come and go, or has it been constant since it started?"      constant 4. SEVERITY: "How bad is the pain?"  (Scale 1-10; or mild, moderate, severe)   - MILD (1-3): doesn't interfere with normal activities    - MODERATE (4-7): interferes with normal activities or awakens from sleep    - SEVERE (8-10):  excruciating pain, unable to do any normal activities      8- pain has been taking tylenol arthritis and rubbing it 5. RADIATION: "Does the pain go anywhere else, shoot into your arms?"     Pain goes almost to elbow- shoulder is painful 6. CORD SYMPTOMS: "Any weakness or numbness of the arms or legs?"     no 7. CAUSE: "What do you think is causing the neck pain?"     Unknown- tension or fever 8. NECK OVERUSE: "Any recent activities that involved turning or twisting the neck?"     no 9. OTHER SYMPTOMS: "Do you have any other symptoms?" (e.g., headache, fever, chest pain, difficulty breathing, neck swelling)     Fever 10. PREGNANCY: "Is there any chance you are pregnant?" "When was your last menstrual period?"       n/a  Protocols  used: NECK PAIN OR STIFFNESS-A-AH

## 2019-03-03 NOTE — Assessment & Plan Note (Signed)
Low grade fever yesterday and today  - likely not related to neck pain and her cough/URI is improved No other concerning symptoms Tylenol prn Monitor temp  -- call if fever persists or if she develops new symptoms.

## 2019-03-09 ENCOUNTER — Telehealth: Payer: Self-pay | Admitting: Internal Medicine

## 2019-03-09 NOTE — Telephone Encounter (Signed)
LVM letting pt know.  

## 2019-03-09 NOTE — Telephone Encounter (Signed)
Spoke with patient to see what was going on. She states that the the pain in her neck was better but it has moved down to her back right under where her shoulder blades are x3 days. Do you think at this point she should see sports medicine?

## 2019-03-09 NOTE — Telephone Encounter (Signed)
Yes we can have her see sports med.  It is still likely muscular in nature and she should continue the muscle relaxer, ice, heat, stretching and massage

## 2019-03-09 NOTE — Telephone Encounter (Signed)
Copied from [place] [id number]. Topic: Quick Communication - See Telephone Encounter >> Mar 09, 2019  8:09 AM Robina Ade, Helene Kelp D wrote: CRM for notification. See Telephone encounter for: 03/09/19. Patient called and would like to speak with Dr. Quay Burow CMA about her issue with her back pain. She said she has not been able to sleep due to the pain. Please call patient back, thanks.

## 2019-03-10 ENCOUNTER — Ambulatory Visit (INDEPENDENT_AMBULATORY_CARE_PROVIDER_SITE_OTHER): Payer: Medicare Other | Admitting: Family Medicine

## 2019-03-10 DIAGNOSIS — M542 Cervicalgia: Secondary | ICD-10-CM | POA: Diagnosis not present

## 2019-03-10 MED ORDER — GABAPENTIN 100 MG PO CAPS: 200.0000 mg | ORAL_CAPSULE | Freq: Every day | ORAL | 3 refills | Status: DC

## 2019-03-10 NOTE — Progress Notes (Signed)
[name] [address] [place], [place] [address] Phone: [phone] Subjective:   Virtual Visit via Video Note  I connected with Heidi Franklin on 03/11/19 at  2:45 PM EDT by a video enabled telemedicine application and verified that I am speaking with the correct person using two identifiers.  Location: Patient: in home setting  Provider: at office setting    I discussed the limitations of evaluation and management by telemedicine and the availability of in person appointments. The patient expressed understanding and agreed to proceed.     I discussed the assessment and treatment plan with the patient. The patient was provided an opportunity to ask questions and all were answered. The patient agreed with the plan and demonstrated an understanding of the instructions.   The patient was advised to call back or seek an in-person evaluation if the symptoms worsen or if the condition fails to improve as anticipated.  I provided 27 minutes of face-to-face time during this encounter.   Lyndal Pulley, DO     CC:   Back and neck pain   [id number]  Heidi Franklin is a 73 y.o. female coming in with complaint of back and neck pain.  Continues and was severe recently. Rates it 7/10 but improving over last 36 hours. Still very painful, no radiation of the pain. No recent injury       neck xrays 9/19- negative   Past Medical History:  Diagnosis Date  . ALLERGIC RHINITIS   . Allergy   . Anxiety state, unspecified   . Arthritis   . BRONCHITIS, CHRONIC   . DEPRESSION   . DIABETES MELLITUS, TYPE II   . GERD   . HYPERLIPIDEMIA   . HYPERTENSION   . Psoriasis    Past Surgical History:  Procedure Laterality Date  . ABDOMINAL HYSTERECTOMY  1980  . TRIGGER FINGER RELEASE     Index finger   Social History   Socioeconomic History  . Marital status: Married    Spouse name: Not on file  . Number of children: 0  . Years of education: 60yr colge  .  Highest education level: Not on file  Occupational History  . Occupation: Retired   Scientific laboratory technician  . Financial resource strain: Somewhat hard  . Food insecurity:    Worry: Sometimes true    Inability: Sometimes true  . Transportation needs:    Medical: No    Non-medical: No  Tobacco Use  . Smoking status: Never Smoker  . Smokeless tobacco: Never Used  Substance and Sexual Activity  . Alcohol use: No    Alcohol/week: 0.0 standard drinks  . Drug use: No  . Sexual activity: Not Currently  Lifestyle  . Physical activity:    Days per week: 0 days    Minutes per session: 0 min  . Stress: Very much  Relationships  . Social connections:    Talks on phone: More than three times a week    Gets together: More than three times a week    Attends religious service: More than 4 times per year    Active member of club or organization: Yes    Attends meetings of clubs or organizations: More than 4 times per year    Relationship status: Married  Other Topics Concern  . Not on file  Social History Narrative   Married, lives with spouse-retired from [employer] insurance   Not employed    Drinks coffee occasional, Consumes 1 soda a  day    Allergies  Allergen Reactions  . Metformin And Related Diarrhea  . Penicillins Rash   Family History  Problem Relation Age of Onset  . Stroke Mother   . Angina Mother   . Diabetes Father   . Hyperlipidemia Other        Parent  . Hypertension Other        Parent  . Diabetes Sister        x1  . CAD Sister   . Diabetes Brother        x 2  . Colon cancer Neg Hx   . Esophageal cancer Neg Hx   . Rectal cancer Neg Hx   . Stomach cancer Neg Hx   . Pancreatic cancer Neg Hx     Current Outpatient Medications (Endocrine & Metabolic):  .  glyBURIDE (DIABETA) 2.5 MG tablet, TAKE 1 TABLET BY MOUTH EVERY DAY WITH BREAKFAST .  predniSONE (DELTASONE) 20 MG tablet, Take 2 tablets (40 mg total) by mouth daily with breakfast.  Current Outpatient Medications  (Cardiovascular):  .  amLODipine (NORVASC) 10 MG tablet, TAKE 1 TABLET BY MOUTH EVERY DAY .  hydrochlorothiazide (HYDRODIURIL) 12.5 MG tablet, Take 1 tablet (12.5 mg total) by mouth daily. Overdue for follow-up appt must see provider for refills .  losartan (COZAAR) 100 MG tablet, TAKE 1 TABLET BY MOUTH EVERY DAY .  losartan-hydrochlorothiazide (HYZAAR) 100-12.5 MG tablet, TAKE 1 TABLET EVERY DAY .  lovastatin (MEVACOR) 20 MG tablet, Take 1 tablet by mouth daily at bedtime. Need follow up visit for more refills.  Current Outpatient Medications (Respiratory):  .  albuterol (VENTOLIN HFA) 108 (90 Base) MCG/ACT inhaler, Inhale 1-2 puffs into the lungs every 6 (six) hours as needed for wheezing or shortness of breath. .  chlorpheniramine (CHLOR-TRIMETON) 4 MG tablet, Take 2 tabs by mouth at bedtime and every 4 hours as needed for drainage/drippy nose/sneezing .  fluticasone (FLONASE) 50 MCG/ACT nasal spray, USE 1 SPRAY IN EACH NOSTRIL TWICE DAILY .  Fluticasone-Salmeterol (ADVAIR DISKUS) 500-50 MCG/DOSE AEPB, Inhale 1 puff into the lungs 2 (two) times daily. Marland Kitchen  HYDROcodone-homatropine (HYCODAN) 5-1.5 MG/5ML syrup, Take 5 mLs by mouth every 8 (eight) hours as needed for cough. .  montelukast (SINGULAIR) 10 MG tablet, Take 1 tablet (10 mg total) by mouth at bedtime.  Current Outpatient Medications (Analgesics):  .  acetaminophen (TYLENOL) 325 MG tablet, Take 650 mg by mouth every 6 (six) hours as needed. Marland Kitchen  aspirin 81 MG tablet, Take 81 mg by mouth every morning.    Current Outpatient Medications (Other):  Marland Kitchen  ALPRAZolam (XANAX) 0.5 MG tablet, Take 1 tablet by mouth every 8 hours if needed for anxiety. .  Calcium Citrate-Vitamin D (CALCIUM + D PO), Take 1 tablet by mouth every morning. .  cyclobenzaprine (FLEXERIL) 5 MG tablet, Take 1-2 tablets (5-10 mg total) by mouth 3 (three) times daily as needed for muscle spasms. .  fluocinonide ointment (LIDEX) 2.95 %, Apply 1 application topically as needed.   .  gabapentin (NEURONTIN) 100 MG capsule, Take 2 capsules (200 mg total) by mouth at bedtime. Marland Kitchen  glucose blood (ONE TOUCH ULTRA TEST) test strip, 1 each by Other route daily. Use 1 strips to check blood sugar twice a day Ex E11.9 .  Lancets (ONETOUCH ULTRASOFT) lancets, PRN .  mometasone (ELOCON) 0.1 % ointment,  .  Multiple Vitamins-Minerals (CENTRUM SILVER PO), Take 1 tablet by mouth every morning. Marland Kitchen  omeprazole (PRILOSEC) 40 MG capsule, Take 1  capsule (40 mg total) by mouth daily. .  Potassium Chloride ER 20 MEQ TBCR, Take 1 tablet by mouth daily. Marland Kitchen  Respiratory Therapy Supplies (FLUTTER) DEVI, Use as directed .  sertraline (ZOLOFT) 100 MG tablet, TAKE 1 TABLET IN THE MORNING .  valACYclovir (VALTREX) 1000 MG tablet, Take 2 tabs by mouth every 12 hours for one day. .  valACYclovir (VALTREX) 500 MG tablet, Take 500 mg by mouth 2 (two) times daily. .  Vitamin D, Ergocalciferol, (DRISDOL) 1.25 MG (50000 UT) CAPS capsule, TAKE 1 CAPSULE (50,000 UNITS TOTAL) BY MOUTH EVERY 7 (SEVEN) DAYS.    Past medical history, social, surgical and family history all reviewed in electronic medical record.  No pertanent information unless stated regarding to the chief complaint.   Review of Systems:  No headache, visual changes, nausea, vomiting, diarrhea, constipation, dizziness, abdominal pain, skin rash, fevers, chills, night sweats, weight loss, swollen lymph nodes, body aches, joint swelling, muscle aches, chest pain, shortness of breath, mood changes.   Objective      General: No apparent distress alert and oriented x3 mood and affect normal, dressed appropriately. Seems uncomfortable  HEENT: Pupils equal, extraocular movements intact  Respiratory: Patient's speak in full sentences and does not appear short of breath      Impression and Recommendations:     This case required medical decision making of moderate complexity. The above documentation has been reviewed and is accurate and complete  Lyndal Pulley, DO       Note: This dictation was prepared with Dragon dictation along with smaller phrase technology. Any transcriptional errors that result from this process are unintentional.

## 2019-03-11 ENCOUNTER — Other Ambulatory Visit: Payer: Self-pay | Admitting: Internal Medicine

## 2019-03-11 ENCOUNTER — Encounter: Payer: Self-pay | Admitting: Family Medicine

## 2019-03-11 NOTE — Assessment & Plan Note (Signed)
Seems positional, no OA on xray. Discussed which activities posture and ergonomics, gabapentin prescribed. Discussed OTC meds. Icing protocol. RTC in 4 weeks either virtual or in office if needed for injections or further evaluations.

## 2019-03-16 ENCOUNTER — Other Ambulatory Visit: Payer: Self-pay | Admitting: Internal Medicine

## 2019-03-16 NOTE — Progress Notes (Signed)
[name] [address], [place] [address] Phone: [phone] Subjective:   I Kandace Blitz am serving as a Education administrator for Dr. Hulan Saas.  I'm seeing this patient by the request  of:    CC: Neck and back pain follow-up  [id number]     Heidi Franklin is a 73 y.o. female coming in with complaint of neck and L scapular pain.  Pain locaation has moved from her neck to her L scapula. Pain keeps her up at night.   Onset- 1 week about Location - Neck but has moved to scapular region (L scapula) Left sided back pain Duration- Intermittent Character- Sharp pain Aggravating factors- Reaching, sweeping the floor, activity w/ her L arm Reliving factors-  Therapies tried- Gabapentin but making her drowsy; Tylenol Severity- Mild today but was having severe pain previously.  Patient has had an exacerbation of this previously approximately 1 year ago but did do well with Toradol, Depo-Medrol and home exercises.  Patient is looking for some relief of pain again.     Past Medical History:  Diagnosis Date  . ALLERGIC RHINITIS   . Allergy   . Anxiety state, unspecified   . Arthritis   . BRONCHITIS, CHRONIC   . DEPRESSION   . DIABETES MELLITUS, TYPE II   . GERD   . HYPERLIPIDEMIA   . HYPERTENSION   . Psoriasis    Past Surgical History:  Procedure Laterality Date  . ABDOMINAL HYSTERECTOMY  1980  . TRIGGER FINGER RELEASE     Index finger   Social History   Socioeconomic History  . Marital status: Married    Spouse name: Not on file  . Number of children: 0  . Years of education: 50yr colge  . Highest education level: Not on file  Occupational History  . Occupation: Retired   Scientific laboratory technician  . Financial resource strain: Somewhat hard  . Food insecurity:    Worry: Sometimes true    Inability: Sometimes true  . Transportation needs:    Medical: No    Non-medical: No  Tobacco Use  . Smoking status: Never Smoker  . Smokeless tobacco: Never  Used  Substance and Sexual Activity  . Alcohol use: No    Alcohol/week: 0.0 standard drinks  . Drug use: No  . Sexual activity: Not Currently  Lifestyle  . Physical activity:    Days per week: 0 days    Minutes per session: 0 min  . Stress: Very much  Relationships  . Social connections:    Talks on phone: More than three times a week    Gets together: More than three times a week    Attends religious service: More than 4 times per year    Active member of club or organization: Yes    Attends meetings of clubs or organizations: More than 4 times per year    Relationship status: Married  Other Topics Concern  . Not on file  Social History Narrative   Married, lives with spouse-retired from [employer] insurance   Not employed    Drinks coffee occasional, Consumes 1 soda a day    Allergies  Allergen Reactions  . Metformin And Related Diarrhea  . Penicillins Rash   Family History  Problem Relation Age of Onset  . Stroke Mother   . Angina Mother   . Diabetes Father   . Hyperlipidemia Other        Parent  . Hypertension Other  Parent  . Diabetes Sister        x1  . CAD Sister   . Diabetes Brother        x 2  . Colon cancer Neg Hx   . Esophageal cancer Neg Hx   . Rectal cancer Neg Hx   . Stomach cancer Neg Hx   . Pancreatic cancer Neg Hx     Current Outpatient Medications (Endocrine & Metabolic):  .  glyBURIDE (DIABETA) 2.5 MG tablet, TAKE 1 TABLET BY MOUTH EVERY DAY WITH BREAKFAST .  predniSONE (DELTASONE) 20 MG tablet, Take 2 tablets (40 mg total) by mouth daily with breakfast.  Current Outpatient Medications (Cardiovascular):  .  amLODipine (NORVASC) 10 MG tablet, TAKE 1 TABLET BY MOUTH EVERY DAY .  hydrochlorothiazide (HYDRODIURIL) 12.5 MG tablet, Take 1 tablet (12.5 mg total) by mouth daily. Marland Kitchen  losartan (COZAAR) 100 MG tablet, TAKE 1 TABLET BY MOUTH EVERY DAY .  losartan-hydrochlorothiazide (HYZAAR) 100-12.5 MG tablet, TAKE 1 TABLET EVERY DAY .  lovastatin  (MEVACOR) 20 MG tablet, Take 1 tablet by mouth daily at bedtime. Need follow up visit for more refills.  Current Outpatient Medications (Respiratory):  .  albuterol (VENTOLIN HFA) 108 (90 Base) MCG/ACT inhaler, Inhale 1-2 puffs into the lungs every 6 (six) hours as needed for wheezing or shortness of breath. .  chlorpheniramine (CHLOR-TRIMETON) 4 MG tablet, Take 2 tabs by mouth at bedtime and every 4 hours as needed for drainage/drippy nose/sneezing .  fluticasone (FLONASE) 50 MCG/ACT nasal spray, USE 1 SPRAY IN EACH NOSTRIL TWICE DAILY .  Fluticasone-Salmeterol (ADVAIR DISKUS) 500-50 MCG/DOSE AEPB, Inhale 1 puff into the lungs 2 (two) times daily. Marland Kitchen  HYDROcodone-homatropine (HYCODAN) 5-1.5 MG/5ML syrup, Take 5 mLs by mouth every 8 (eight) hours as needed for cough. .  montelukast (SINGULAIR) 10 MG tablet, Take 1 tablet (10 mg total) by mouth at bedtime.  Current Outpatient Medications (Analgesics):  .  acetaminophen (TYLENOL) 325 MG tablet, Take 650 mg by mouth every 6 (six) hours as needed. Marland Kitchen  aspirin 81 MG tablet, Take 81 mg by mouth every morning.  .  meloxicam (MOBIC) 7.5 MG tablet, Take 1 tablet (7.5 mg total) by mouth daily.   Current Outpatient Medications (Other):  Marland Kitchen  ALPRAZolam (XANAX) 0.5 MG tablet, Take 1 tablet by mouth every 8 hours if needed for anxiety. .  Calcium Citrate-Vitamin D (CALCIUM + D PO), Take 1 tablet by mouth every morning. .  cyclobenzaprine (FLEXERIL) 5 MG tablet, Take 1-2 tablets (5-10 mg total) by mouth 3 (three) times daily as needed for muscle spasms. .  fluocinonide ointment (LIDEX) 5.73 %, Apply 1 application topically as needed.  .  gabapentin (NEURONTIN) 100 MG capsule, Take 2 capsules (200 mg total) by mouth at bedtime. Marland Kitchen  glucose blood (ONE TOUCH ULTRA TEST) test strip, 1 each by Other route daily. Use 1 strips to check blood sugar twice a day Ex E11.9 .  Lancets (ONETOUCH ULTRASOFT) lancets, PRN .  mometasone (ELOCON) 0.1 % ointment,  .  Multiple  Vitamins-Minerals (CENTRUM SILVER PO), Take 1 tablet by mouth every morning. Marland Kitchen  omeprazole (PRILOSEC) 40 MG capsule, Take 1 capsule (40 mg total) by mouth daily. .  Potassium Chloride ER 20 MEQ TBCR, Take 1 tablet by mouth daily. Marland Kitchen  Respiratory Therapy Supplies (FLUTTER) DEVI, Use as directed .  sertraline (ZOLOFT) 100 MG tablet, TAKE 1 TABLET IN THE MORNING .  valACYclovir (VALTREX) 1000 MG tablet, Take 2 tabs by mouth every 12 hours  for one day. .  valACYclovir (VALTREX) 500 MG tablet, Take 500 mg by mouth 2 (two) times daily. .  Vitamin D, Ergocalciferol, (DRISDOL) 1.25 MG (50000 UT) CAPS capsule, TAKE 1 CAPSULE (50,000 UNITS TOTAL) BY MOUTH EVERY 7 (SEVEN) DAYS.    Past medical history, social, surgical and family history all reviewed in electronic medical record.  No pertanent information unless stated regarding to the chief complaint.   Review of Systems:  No headache, visual changes, nausea, vomiting, diarrhea, constipation, dizziness, abdominal pain, skin rash, fevers, chills, night sweats, weight loss, swollen lymph nodes, body aches, joint swelling,  chest pain, shortness of breath, mood changes.  Positive muscle aches  Objective  Blood pressure 126/60, pulse (!) 106, height 5' (1.524 m), weight 159 lb (72.1 kg), SpO2 98 %.   General: No apparent distress alert and oriented x3 mood and affect normal, dressed appropriately.  HEENT: Pupils equal, extraocular movements intact  Respiratory: Patient's speak in full sentences and does not appear short of breath  Cardiovascular: No lower extremity edema, non tender, no erythema  Skin: Warm dry intact with no signs of infection or rash on extremities or on axial skeleton.  Abdomen: Soft nontender  Neuro: Cranial nerves II through XII are intact, neurovascularly intact in all extremities with 2+ DTRs and 2+ pulses.  Lymph: No lymphadenopathy of posterior or anterior cervical chain or axillae bilaterally.  Gait antalgic MSK:  tender with  full range of motion and good stability and symmetric strength and tone of shoulders, elbows, wrist, hip, knee and ankles bilaterally.  Arthritic changes of multiple joints Neck exam shows the patient does have loss of lordosis.  Patient has pain with Spurling's but no radicular symptoms.  Limited range of motion in all planes.  Neurovascular intact distally with no weakness.  Patient low back mild loss of lordosis.  Tenderness to palpation in the paraspinal musculature lumbar spine right greater than left.  Negative straight leg test but significant tightness flexion strength.  4+ out of 5 strength but symmetric to the lower extremities   Impression and Recommendations:     This case required medical decision making of moderate complexity. The above documentation has been reviewed and is accurate and complete Lyndal Pulley, DO       Note: This dictation was prepared with Dragon dictation along with smaller phrase technology. Any transcriptional errors that result from this process are unintentional.

## 2019-03-17 ENCOUNTER — Other Ambulatory Visit: Payer: Self-pay

## 2019-03-17 ENCOUNTER — Encounter: Payer: Self-pay | Admitting: Family Medicine

## 2019-03-17 ENCOUNTER — Ambulatory Visit (INDEPENDENT_AMBULATORY_CARE_PROVIDER_SITE_OTHER): Payer: Medicare Other | Admitting: Family Medicine

## 2019-03-17 VITALS — BP 126/60 | HR 106 | Ht 60.0 in | Wt 159.0 lb

## 2019-03-17 DIAGNOSIS — M542 Cervicalgia: Secondary | ICD-10-CM | POA: Diagnosis not present

## 2019-03-17 MED ORDER — KETOROLAC TROMETHAMINE 60 MG/2ML IM SOLN
60.0000 mg | Freq: Once | INTRAMUSCULAR | Status: AC
  Administered 2019-03-17: 15:00:00 60 mg via INTRAMUSCULAR

## 2019-03-17 MED ORDER — MELOXICAM 7.5 MG PO TABS: 7.5000 mg | ORAL_TABLET | Freq: Every day | ORAL | 0 refills | Status: DC

## 2019-03-17 MED ORDER — METHYLPREDNISOLONE ACETATE 80 MG/ML IJ SUSP: 80.0000 mg | Freq: Once | INTRAMUSCULAR | Status: DC

## 2019-03-17 MED ORDER — KETOROLAC TROMETHAMINE 60 MG/2ML IM SOLN: 60.0000 mg | Freq: Once | INTRAMUSCULAR | Status: DC

## 2019-03-17 MED ORDER — METHYLPREDNISOLONE ACETATE 80 MG/ML IJ SUSP
80.0000 mg | Freq: Once | INTRAMUSCULAR | Status: AC
  Administered 2019-03-17: 15:00:00 80 mg via INTRAMUSCULAR

## 2019-03-17 NOTE — Patient Instructions (Addendum)
Good to see you  Ice 20 minutes 2 times daily. Usually after activity and before bed. Exercises 3 times a week.  2 injections today  Meloxicam daily for 10 days then as needed See me again in 3 weeks if not perfect  Be careful

## 2019-03-18 ENCOUNTER — Encounter: Payer: Self-pay | Admitting: Family Medicine

## 2019-03-18 NOTE — Assessment & Plan Note (Signed)
Patient has had neck pain previously.  Neck x-rays previously have shown no significant arthritic changes.  More likely muscle spasm.  No significant radicular symptoms.  Toradol and Depo-Medrol given today.  Discussed possible formal physical therapy which patient declined more secondary to the coronavirus at the moment.  Encouraged home exercises and given exercises again by athletic trainer.  If patient has worsening pain we will discuss with patient again at great length about different treatment options.  Patient will likely will do well with conservative therapy.

## 2019-03-23 ENCOUNTER — Telehealth: Payer: Self-pay | Admitting: Internal Medicine

## 2019-03-23 NOTE — Telephone Encounter (Signed)
Ok with me 

## 2019-03-23 NOTE — Telephone Encounter (Signed)
Copied from [place] [id number]. Topic: Appointment Scheduling - Transfer of Care >> Mar 23, 2019  2:29 PM Sheran Luz wrote: Pt is requesting to transfer FROM: Dr. Quay Burow  Pt is requesting to transfer TO: Dr. Charlett Blake  Reason for requested transfer: Patient's husband currently sees Dr. Charlett Blake- would like same PCP   Send CRM to patient's current PCP (transferring FROM).

## 2019-03-24 ENCOUNTER — Telehealth: Payer: Self-pay | Admitting: *Deleted

## 2019-03-24 NOTE — Telephone Encounter (Signed)
Called patient and LVM stating that nurse needs to ask screening questions for covid-19 before upcoming AWV. Nurse requested that patient call her back and her contact number was provided.

## 2019-03-24 NOTE — Telephone Encounter (Signed)
New pt appt schedule on 04/05/19 for an in person visit - followed DOD June calendar protocol

## 2019-03-24 NOTE — Telephone Encounter (Signed)
Kirsten, Please schedule patient a new patient appt with Dr. Charlett Blake

## 2019-03-24 NOTE — Telephone Encounter (Signed)
OK with me.

## 2019-03-25 ENCOUNTER — Other Ambulatory Visit: Payer: Self-pay | Admitting: Internal Medicine

## 2019-03-25 NOTE — Telephone Encounter (Signed)
Virtual is ideal if patient requesting in person can consider

## 2019-03-25 NOTE — Telephone Encounter (Signed)
Kirsten  DOD slot is for healthy CPE patients under the age 74 only according to Dr. Charlett Blake this is a new patient she will need to da a virtual.

## 2019-03-26 ENCOUNTER — Ambulatory Visit: Payer: Medicare Other | Admitting: Internal Medicine

## 2019-03-26 ENCOUNTER — Ambulatory Visit: Payer: Medicare Other

## 2019-03-26 ENCOUNTER — Other Ambulatory Visit: Payer: Self-pay | Admitting: Physical Therapy

## 2019-03-26 DIAGNOSIS — M542 Cervicalgia: Secondary | ICD-10-CM

## 2019-03-29 ENCOUNTER — Telehealth: Payer: Self-pay

## 2019-03-29 NOTE — Telephone Encounter (Signed)
Copied from [place] [id number]. Topic: General - Other >> Mar 29, 2019  8:26 AM Lennox Solders wrote: Reason for CRM: pt is waiting for mri of her neck. Pt is also have back pain and would like to make sure mri of her back is included. Dr Tamala Julian will order mri

## 2019-03-29 NOTE — Telephone Encounter (Signed)
Spoke with patient and scheduled her for later this week to speak with Dr. Tamala Julian due to her changing pain.

## 2019-03-31 ENCOUNTER — Ambulatory Visit (INDEPENDENT_AMBULATORY_CARE_PROVIDER_SITE_OTHER): Payer: Medicare Other | Admitting: Family Medicine

## 2019-03-31 ENCOUNTER — Encounter: Payer: Self-pay | Admitting: Family Medicine

## 2019-03-31 DIAGNOSIS — M5416 Radiculopathy, lumbar region: Secondary | ICD-10-CM | POA: Insufficient documentation

## 2019-03-31 NOTE — Progress Notes (Signed)
Virtual Visit via Video Note  I connected with [name] on 03/31/19 at 12:30 PM EDT by a video enabled telemedicine application and verified that I am speaking with the correct person using two identifiers.  Location: Patient: in home setting Provider: in office setting    I discussed the limitations of evaluation and management by telemedicine and the availability of in person appointments. The patient expressed understanding and agreed to proceed.  History of Present Illness: Low back pain.  Patient has had this for quite some time.  Describes it as a dull, throbbing aching pain with going down.  Is going down the right leg.  Rates the severity of pain is 9 out of 10.  Patient states that the pain is unrelenting.  9 out of 10.  Affecting daily activities.  Patient has had x-rays previously that were independently visualized by me showing some degenerative disc disease.  Patient states that the gabapentin has not been helpful.    Observations/Objective:  Patient appears very uncomfortable on her couch.   Assessment and Plan: Lumbar radiculopathy.  Affecting daily activities, waking her up at night.  Pain is unrelenting at this point.  Patient is a 73 year old female with a history of degenerative disc disease.  Concern for more of a lumbar radiculopathy.  Potentially even having weakness of the lower extremity.  Difficult to assess on virtual exam.  Patient will follow-up after imaging.      I discussed the assessment and treatment plan with the patient. The patient was provided an opportunity to ask questions and all were answered. The patient agreed with the plan and demonstrated an understanding of the instructions.   The patient was advised to call back or seek an in-person evaluation if the symptoms worsen or if the condition fails to improve as anticipated.  I provided 26 minutes of non-face-to-face time during this encounter.   Lyndal Pulley, DO

## 2019-03-31 NOTE — Addendum Note (Signed)
Addended by: Douglass Rivers T on: 03/31/2019 01:12 PM   Modules accepted: Orders

## 2019-03-31 NOTE — Telephone Encounter (Signed)
appt made for 6/8

## 2019-04-01 ENCOUNTER — Other Ambulatory Visit: Payer: Self-pay | Admitting: Family Medicine

## 2019-04-05 ENCOUNTER — Encounter: Payer: Self-pay | Admitting: Family Medicine

## 2019-04-05 ENCOUNTER — Ambulatory Visit (INDEPENDENT_AMBULATORY_CARE_PROVIDER_SITE_OTHER): Payer: Medicare Other | Admitting: Family Medicine

## 2019-04-05 ENCOUNTER — Other Ambulatory Visit: Payer: Self-pay

## 2019-04-05 DIAGNOSIS — R058 Other specified cough: Secondary | ICD-10-CM

## 2019-04-05 DIAGNOSIS — E559 Vitamin D deficiency, unspecified: Secondary | ICD-10-CM

## 2019-04-05 DIAGNOSIS — E1165 Type 2 diabetes mellitus with hyperglycemia: Secondary | ICD-10-CM | POA: Diagnosis not present

## 2019-04-05 DIAGNOSIS — R05 Cough: Secondary | ICD-10-CM | POA: Diagnosis not present

## 2019-04-05 DIAGNOSIS — I10 Essential (primary) hypertension: Secondary | ICD-10-CM

## 2019-04-05 DIAGNOSIS — E785 Hyperlipidemia, unspecified: Secondary | ICD-10-CM | POA: Diagnosis not present

## 2019-04-05 DIAGNOSIS — R059 Cough, unspecified: Secondary | ICD-10-CM

## 2019-04-05 DIAGNOSIS — IMO0002 Reserved for concepts with insufficient information to code with codable children: Secondary | ICD-10-CM

## 2019-04-05 DIAGNOSIS — Z8619 Personal history of other infectious and parasitic diseases: Secondary | ICD-10-CM

## 2019-04-05 DIAGNOSIS — E1169 Type 2 diabetes mellitus with other specified complication: Secondary | ICD-10-CM

## 2019-04-05 DIAGNOSIS — M5416 Radiculopathy, lumbar region: Secondary | ICD-10-CM

## 2019-04-05 DIAGNOSIS — E1140 Type 2 diabetes mellitus with diabetic neuropathy, unspecified: Secondary | ICD-10-CM

## 2019-04-05 MED ORDER — MONTELUKAST SODIUM 10 MG PO TABS: 10.0000 mg | ORAL_TABLET | Freq: Every day | ORAL | 5 refills | Status: DC

## 2019-04-05 NOTE — Assessment & Plan Note (Signed)
Supplement and monitor 

## 2019-04-08 ENCOUNTER — Other Ambulatory Visit: Payer: Self-pay | Admitting: Internal Medicine

## 2019-04-09 ENCOUNTER — Other Ambulatory Visit: Payer: Self-pay | Admitting: Family Medicine

## 2019-04-09 NOTE — Telephone Encounter (Signed)
Refill done.  

## 2019-04-11 DIAGNOSIS — Z8619 Personal history of other infectious and parasitic diseases: Secondary | ICD-10-CM | POA: Insufficient documentation

## 2019-04-11 NOTE — Assessment & Plan Note (Signed)
Encouraged heart healthy diet, increase exercise, avoid trans fats, consider a krill oil cap daily 

## 2019-04-11 NOTE — Assessment & Plan Note (Signed)
Chronic bronchitis since chilhood.

## 2019-04-11 NOTE — Assessment & Plan Note (Signed)
minimize simple carbs. Increase exercise as tolerated. Continue current meds  

## 2019-04-11 NOTE — Assessment & Plan Note (Signed)
Encouraged Shingrix shots.

## 2019-04-11 NOTE — Assessment & Plan Note (Signed)
Monitor weekly,  no changes to meds. Encouraged heart healthy diet such as the DASH diet and exercise as tolerated.

## 2019-04-11 NOTE — Assessment & Plan Note (Signed)
Follows with [facility] and doing well with current meds. Still coughin but minimal

## 2019-04-11 NOTE — Assessment & Plan Note (Signed)
Encouraged moist heat and gentle stretching as tolerated. May try NSAIDs and prescription meds as directed and report if symptoms worsen or seek immediate care. Has an MRI scheduled for April 20, 2019. Use tylenol BID, meloxicam and muscle relaxers prn and continue to follow with sports medicine.

## 2019-04-11 NOTE — Progress Notes (Signed)
Virtual Visit via Video Note  I connected with Heidi Franklin on 04/05/19 at  9:40 AM EDT by a video enabled telemedicine application and verified that I am speaking with the correct person using two identifiers.  Location: Patient: home  Provider: office   I discussed the limitations of evaluation and management by telemedicine and the availability of in person appointments. The patient expressed understanding and agreed to proceed. Heidi Franklin, CMA was able to get patient set up on video visit    Subjective:    Patient ID: Heidi Franklin, female    DOB: [birth year], 73 y.o.   MRN: [id number]  No chief complaint on file.   HPI Patient is in today for establish care with new provider and follow up on chronic medical concerns including hypertension, hyperlipidemia, diabetes, back pain and chronic cough. She has struggled with recurrent bronchitis, allergies and cough since 73 years of age and is doing fairly well at the present time. She is currently following closely with sports med due to low back pain and is scheduled for MRI on 04/20/19. No fall or incontinence. No polyuria or polydipsia. Is maintaining quarantine well. Denies CP/palp/SOB/HA/congestion/fevers/GI or GU c/o. Taking meds as prescribed. No recent febrile illness or hospitalizations.   Past Medical History:  Diagnosis Date  . ALLERGIC RHINITIS   . Allergy   . Anxiety state, unspecified   . Arthritis   . Asthma   . BRONCHITIS, CHRONIC   . DEPRESSION   . DIABETES MELLITUS, TYPE II   . GERD   . HYPERLIPIDEMIA   . HYPERTENSION   . Psoriasis     Past Surgical History:  Procedure Laterality Date  . ABDOMINAL HYSTERECTOMY  1980  . TRIGGER FINGER RELEASE     Index finger    Family History  Problem Relation Age of Onset  . Stroke Mother   . Angina Mother   . Hyperlipidemia Mother   . Hypertension Mother   . Heart disease Mother   . Depression Mother   . Diabetes Father   . Hyperlipidemia Other        Parent  .  Hypertension Other        Parent  . Diabetes Sister        x1  . CAD Sister   . COPD Sister        smoker  . Hyperlipidemia Sister   . Hypertension Sister   . Diabetes Brother        x 2  . Hyperlipidemia Brother   . Hypertension Brother   . Diabetes Brother   . Hyperlipidemia Brother   . Hypertension Brother   . CAD Sister   . Arthritis Sister   . Hyperlipidemia Sister   . Hypertension Sister   . Colon cancer Neg Hx   . Esophageal cancer Neg Hx   . Rectal cancer Neg Hx   . Stomach cancer Neg Hx   . Pancreatic cancer Neg Hx     Social History   Socioeconomic History  . Marital status: Married    Spouse name: Not on file  . Number of children: 0  . Years of education: 65yr colge  . Highest education level: Not on file  Occupational History  . Occupation: Retired   Scientific laboratory technician  . Financial resource strain: Somewhat hard  . Food insecurity    Worry: Sometimes true    Inability: Sometimes true  . Transportation needs    Medical: No    Non-medical: No  Tobacco Use  .  Smoking status: Never Smoker  . Smokeless tobacco: Never Used  Substance and Sexual Activity  . Alcohol use: No    Alcohol/week: 0.0 standard drinks  . Drug use: No  . Sexual activity: Not Currently  Lifestyle  . Physical activity    Days per week: 0 days    Minutes per session: 0 min  . Stress: Very much  Relationships  . Social connections    Talks on phone: More than three times a week    Gets together: More than three times a week    Attends religious service: More than 4 times per year    Active member of club or organization: Yes    Attends meetings of clubs or organizations: More than 4 times per year    Relationship status: Married  . Intimate partner violence    Fear of current or ex partner: No    Emotionally abused: No    Physically abused: No    Forced sexual activity: No  Other Topics Concern  . Not on file  Social History Narrative   Married, lives with spouse-retired  from [employer] insurance   Not employed    Drinks coffee occasional, Consumes 1 soda a day    No dietary restrictions    Outpatient Medications Prior to Visit  Medication Sig Dispense Refill  . acetaminophen (TYLENOL) 325 MG tablet Take 650 mg by mouth every 6 (six) hours as needed.    Marland Kitchen albuterol (VENTOLIN HFA) 108 (90 Base) MCG/ACT inhaler Inhale 1-2 puffs into the lungs every 6 (six) hours as needed for wheezing or shortness of breath. 1 Inhaler 3  . ALPRAZolam (XANAX) 0.5 MG tablet Take 1 tablet by mouth every 8 hours if needed for anxiety. 30 tablet 0  . amLODipine (NORVASC) 10 MG tablet TAKE 1 TABLET BY MOUTH EVERY DAY 90 tablet 1  . aspirin 81 MG tablet Take 81 mg by mouth every morning.     . Calcium Citrate-Vitamin D (CALCIUM + D PO) Take 1 tablet by mouth every morning.    . chlorpheniramine (CHLOR-TRIMETON) 4 MG tablet Take 2 tabs by mouth at bedtime and every 4 hours as needed for drainage/drippy nose/sneezing    . cyclobenzaprine (FLEXERIL) 5 MG tablet Take 1-2 tablets (5-10 mg total) by mouth 3 (three) times daily as needed for muscle spasms. 30 tablet 0  . fluocinonide ointment (LIDEX) 6.38 % Apply 1 application topically as needed.     . fluticasone (FLONASE) 50 MCG/ACT nasal spray USE 1 SPRAY IN EACH NOSTRIL TWICE DAILY 48 g 1  . gabapentin (NEURONTIN) 100 MG capsule TAKE 2 CAPSULES (200 MG TOTAL) BY MOUTH AT BEDTIME. 180 capsule 2  . glucose blood (ONE TOUCH ULTRA TEST) test strip 1 each by Other route daily. Use 1 strips to check blood sugar twice a day Ex E11.9 100 each 3  . glyBURIDE (DIABETA) 2.5 MG tablet TAKE 1 TABLET BY MOUTH EVERY DAY WITH BREAKFAST 90 tablet 0  . Lancets (ONETOUCH ULTRASOFT) lancets PRN 100 each 3  . losartan (COZAAR) 100 MG tablet TAKE 1 TABLET BY MOUTH EVERY DAY 90 tablet 1  . losartan-hydrochlorothiazide (HYZAAR) 100-12.5 MG tablet TAKE 1 TABLET EVERY DAY 90 tablet 0  . lovastatin (MEVACOR) 20 MG tablet Take 1 tablet by mouth daily at bedtime. Please  defer future requests to new PCP. 30 tablet 0  . mometasone (ELOCON) 0.1 % ointment     . Multiple Vitamins-Minerals (CENTRUM SILVER PO) Take 1 tablet by mouth every  morning.    Marland Kitchen omeprazole (PRILOSEC) 40 MG capsule Take 1 capsule (40 mg total) by mouth daily. 90 capsule 1  . Potassium Chloride ER 20 MEQ TBCR Take 1 tablet by mouth daily. 90 tablet 1  . Respiratory Therapy Supplies (FLUTTER) DEVI Use as directed 1 each 0  . sertraline (ZOLOFT) 100 MG tablet TAKE 1 TABLET IN THE MORNING 90 tablet 1  . valACYclovir (VALTREX) 1000 MG tablet Take 2 tabs by mouth every 12 hours for one day. 12 tablet 5  . valACYclovir (VALTREX) 500 MG tablet Take 500 mg by mouth 2 (two) times daily.  0  . Vitamin D, Ergocalciferol, (DRISDOL) 1.25 MG (50000 UT) CAPS capsule TAKE 1 CAPSULE (50,000 UNITS TOTAL) BY MOUTH EVERY 7 (SEVEN) DAYS. 12 capsule 0  . Fluticasone-Salmeterol (ADVAIR DISKUS) 500-50 MCG/DOSE AEPB Inhale 1 puff into the lungs 2 (two) times daily. 60 each 11  . hydrochlorothiazide (HYDRODIURIL) 12.5 MG tablet Take 1 tablet (12.5 mg total) by mouth daily. 30 tablet 0  . HYDROcodone-homatropine (HYCODAN) 5-1.5 MG/5ML syrup Take 5 mLs by mouth every 8 (eight) hours as needed for cough. 180 mL 0  . meloxicam (MOBIC) 7.5 MG tablet Take 1 tablet (7.5 mg total) by mouth daily. 30 tablet 0  . montelukast (SINGULAIR) 10 MG tablet Take 1 tablet (10 mg total) by mouth at bedtime. 30 tablet 5  . predniSONE (DELTASONE) 20 MG tablet Take 2 tablets (40 mg total) by mouth daily with breakfast. 10 tablet 0   No facility-administered medications prior to visit.     Allergies  Allergen Reactions  . Metformin And Related Diarrhea  . Penicillins Rash    Review of Systems  Constitutional: Positive for malaise/fatigue. Negative for chills and fever.  HENT: Positive for congestion. Negative for hearing loss.   Eyes: Negative for discharge.  Respiratory: Positive for cough. Negative for sputum production and shortness  of breath.   Cardiovascular: Negative for chest pain, palpitations and leg swelling.  Gastrointestinal: Negative for abdominal pain, blood in stool, constipation, diarrhea, heartburn, nausea and vomiting.  Genitourinary: Negative for dysuria, frequency, hematuria and urgency.  Musculoskeletal: Positive for back pain, joint pain and neck pain. Negative for falls and myalgias.  Skin: Negative for rash.  Neurological: Negative for dizziness, sensory change, loss of consciousness, weakness and headaches.  Endo/Heme/Allergies: Negative for environmental allergies. Does not bruise/bleed easily.  Psychiatric/Behavioral: Negative for depression and suicidal ideas. The patient is not nervous/anxious and does not have insomnia.        Objective:    Physical Exam Constitutional:      Appearance: Normal appearance. She is not ill-appearing.  HENT:     Head: Normocephalic and atraumatic.     Nose: Nose normal.  Pulmonary:     Effort: Pulmonary effort is normal.  Neurological:     Mental Status: She is alert and oriented to person, place, and time.  Psychiatric:        Behavior: Behavior normal.     There were no vitals taken for this visit. Wt Readings from Last 3 Encounters:  03/17/19 159 lb (72.1 kg)  11/26/18 157 lb (71.2 kg)  10/23/18 155 lb 1.9 oz (70.4 kg)    Diabetic Foot Exam - Simple   No data filed     Lab Results  Component Value Date   WBC 11.6 (H) 07/06/2018   HGB 11.7 (L) 07/06/2018   HCT 35.3 (L) 07/06/2018   PLT 358.0 07/06/2018   GLUCOSE 111 (H) 07/06/2018  CHOL 149 02/10/2018   TRIG 155.0 (H) 02/10/2018   HDL 33.50 (L) 02/10/2018   LDLDIRECT 155.1 02/09/2013   LDLCALC 84 02/10/2018   ALT 20 07/06/2018   AST 21 07/06/2018   NA 139 07/06/2018   K 3.8 07/06/2018   CL 103 07/06/2018   CREATININE 0.89 07/06/2018   BUN 24 (H) 07/06/2018   CO2 28 07/06/2018   TSH 2.85 02/10/2018   HGBA1C 6.7 (H) 07/06/2018   MICROALBUR 1.6 08/08/2016    Lab Results   Component Value Date   TSH 2.85 02/10/2018   Lab Results  Component Value Date   WBC 11.6 (H) 07/06/2018   HGB 11.7 (L) 07/06/2018   HCT 35.3 (L) 07/06/2018   MCV 86.2 07/06/2018   PLT 358.0 07/06/2018   Lab Results  Component Value Date   NA 139 07/06/2018   K 3.8 07/06/2018   CO2 28 07/06/2018   GLUCOSE 111 (H) 07/06/2018   BUN 24 (H) 07/06/2018   CREATININE 0.89 07/06/2018   BILITOT 0.3 07/06/2018   ALKPHOS 69 07/06/2018   AST 21 07/06/2018   ALT 20 07/06/2018   PROT 8.3 07/06/2018   ALBUMIN 4.3 07/06/2018   CALCIUM 10.4 07/06/2018   ANIONGAP 7 10/14/2015   GFR 79.89 07/06/2018   Lab Results  Component Value Date   CHOL 149 02/10/2018   Lab Results  Component Value Date   HDL 33.50 (L) 02/10/2018   Lab Results  Component Value Date   LDLCALC 84 02/10/2018   Lab Results  Component Value Date   TRIG 155.0 (H) 02/10/2018   Lab Results  Component Value Date   CHOLHDL 4 02/10/2018   Lab Results  Component Value Date   HGBA1C 6.7 (H) 07/06/2018       Assessment & Plan:   Problem List Items Addressed This Visit    Type 2 diabetes, uncontrolled, with neuropathy ([place])     minimize simple carbs. Increase exercise as tolerated. Continue current meds      Hyperlipidemia associated with type 2 diabetes mellitus ([place])    Encouraged heart healthy diet, increase exercise, avoid trans fats, consider a krill oil cap daily      Essential hypertension    Monitor weekly,  no changes to meds. Encouraged heart healthy diet such as the DASH diet and exercise as tolerated.       Upper airway cough syndrome    Follows with [facility] and doing well with current meds. Still coughin but minimal      Cough    Chronic bronchitis since chilhood.       Lumbar radiculopathy    Encouraged moist heat and gentle stretching as tolerated. May try NSAIDs and prescription meds as directed and report if symptoms worsen or seek immediate care. Has an MRI scheduled for April 20, 2019. Use tylenol BID, meloxicam and muscle relaxers prn and continue to follow with sports medicine.       Vitamin D deficiency    Supplement and monitor      History of chicken pox    Encouraged Shingrix shots.          I have discontinued Maleiah Keesling's montelukast, Fluticasone-Salmeterol, HYDROcodone-homatropine, and predniSONE. I am also having her start on montelukast. Additionally, I am having her maintain her aspirin, chlorpheniramine, Multiple Vitamins-Minerals (CENTRUM SILVER PO), Flutter, Calcium Citrate-Vitamin D (CALCIUM + D PO), glucose blood, onetouch ultrasoft, fluocinonide ointment, mometasone, acetaminophen, fluticasone, valACYclovir, albuterol, sertraline, Potassium Chloride ER, valACYclovir, losartan-hydrochlorothiazide, Vitamin D (Ergocalciferol),  amLODipine, ALPRAZolam, omeprazole, losartan, cyclobenzaprine, glyBURIDE, lovastatin, and gabapentin.  Meds ordered this encounter  Medications  . montelukast (SINGULAIR) 10 MG tablet    Sig: Take 1 tablet (10 mg total) by mouth at bedtime.    Dispense:  30 tablet    Refill:  5     I discussed the assessment and treatment plan with the patient. The patient was provided an opportunity to ask questions and all were answered. The patient agreed with the plan and demonstrated an understanding of the instructions.   The patient was advised to call back or seek an in-person evaluation if the symptoms worsen or if the condition fails to improve as anticipated.  I provided 35 minutes of non-face-to-face time during this encounter.   Penni Homans, MD

## 2019-04-12 ENCOUNTER — Other Ambulatory Visit: Payer: Self-pay | Admitting: Family Medicine

## 2019-04-13 ENCOUNTER — Other Ambulatory Visit: Payer: Self-pay

## 2019-04-13 ENCOUNTER — Ambulatory Visit (INDEPENDENT_AMBULATORY_CARE_PROVIDER_SITE_OTHER): Payer: Medicare Other | Admitting: Family Medicine

## 2019-04-13 ENCOUNTER — Encounter: Payer: Self-pay | Admitting: Family Medicine

## 2019-04-13 VITALS — BP 126/82 | HR 81 | Ht 60.0 in | Wt 159.0 lb

## 2019-04-13 DIAGNOSIS — M5416 Radiculopathy, lumbar region: Secondary | ICD-10-CM

## 2019-04-13 MED ORDER — KETOROLAC TROMETHAMINE 60 MG/2ML IM SOLN
60.0000 mg | Freq: Once | INTRAMUSCULAR | Status: AC
  Administered 2019-04-13: 60 mg via INTRAMUSCULAR

## 2019-04-13 MED ORDER — METHYLPREDNISOLONE ACETATE 80 MG/ML IJ SUSP
80.0000 mg | Freq: Once | INTRAMUSCULAR | Status: AC
  Administered 2019-04-13: 80 mg via INTRAMUSCULAR

## 2019-04-13 MED ORDER — TRAMADOL HCL 50 MG PO TABS: 50.0000 mg | ORAL_TABLET | Freq: Two times a day (BID) | ORAL | 0 refills | Status: AC | PRN

## 2019-04-13 NOTE — Assessment & Plan Note (Signed)
Continues to have lumbar radiculopathy.  Worsening over the course of time.  Now has some weakness and decreased deep tendon reflexes.  Patient is to get MRI next week.  We discussed tramadol.  Continue the gabapentin.  Would like to hold on the prednisone at this point secondary to the corona outbreak as well as with her having had recent MRI.  Follow-up again after MRI to discuss the possibility of epidurals or surgical intervention spent  25 minutes with patient face-to-face and had greater than 50% of counseling including as described above in assessment and plan.

## 2019-04-13 NOTE — Progress Notes (Signed)
[facility] [place],  [address] Phone: [phone] Subjective:    I'm seeing this patient by the request  of:    CC:   BPZ:[id number]   03/31/2019: Assessment and Plan: Lumbar radiculopathy.  Affecting daily activities, waking her up at night.  Pain is unrelenting at this point.  Patient is a 73 year old female with a history of degenerative disc disease.  Concern for more of a lumbar radiculopathy.  Potentially even having weakness of the lower extremity.  Difficult to assess on virtual exam.  Patient will follow-up after imaging.   Update 04/13/2019: Aneisa Franklin is a 73 y.o. female coming in with complaint of left sided thoracic and lumbar spine pain. Pain is the same as last visit on 03/31/2019. Most of her pain is present in the morning. Has been using heating pad and ice. Using gabapentin 100mg  nightly and on in the morning. Also using meloxicam daily. Believes that these medications may provide slight relief.  Patient actually feels like her leg is becoming more heavy.  Having difficulty even doing daily activities such as walking.     Past Medical History:  Diagnosis Date  . ALLERGIC RHINITIS   . Allergy   . Anxiety state, unspecified   . Arthritis   . Asthma   . BRONCHITIS, CHRONIC   . DEPRESSION   . DIABETES MELLITUS, TYPE II   . GERD   . HYPERLIPIDEMIA   . HYPERTENSION   . Psoriasis    Past Surgical History:  Procedure Laterality Date  . ABDOMINAL HYSTERECTOMY  1980  . TRIGGER FINGER RELEASE     Index finger   Social History   Socioeconomic History  . Marital status: Married    Spouse name: Not on file  . Number of children: 0  . Years of education: 63yr colge  . Highest education level: Not on file  Occupational History  . Occupation: Retired   Scientific laboratory technician  . Financial resource strain: Somewhat hard  . Food insecurity    Worry: Sometimes true    Inability: Sometimes true  . Transportation needs   Medical: No    Non-medical: No  Tobacco Use  . Smoking status: Never Smoker  . Smokeless tobacco: Never Used  Substance and Sexual Activity  . Alcohol use: No    Alcohol/week: 0.0 standard drinks  . Drug use: No  . Sexual activity: Not Currently  Lifestyle  . Physical activity    Days per week: 0 days    Minutes per session: 0 min  . Stress: Very much  Relationships  . Social connections    Talks on phone: More than three times a week    Gets together: More than three times a week    Attends religious service: More than 4 times per year    Active member of club or organization: Yes    Attends meetings of clubs or organizations: More than 4 times per year    Relationship status: Married  Other Topics Concern  . Not on file  Social History Narrative   Married, lives with spouse-retired from [employer] insurance   Not employed    Drinks coffee occasional, Consumes 1 soda a day    No dietary restrictions   Allergies  Allergen Reactions  . Metformin And Related Diarrhea  . Penicillins Rash   Family History  Problem Relation Age of Onset  . Stroke Mother   . Angina Mother   . Hyperlipidemia Mother   .  Hypertension Mother   . Heart disease Mother   . Depression Mother   . Diabetes Father   . Hyperlipidemia Other        Parent  . Hypertension Other        Parent  . Diabetes Sister        x1  . CAD Sister   . COPD Sister        smoker  . Hyperlipidemia Sister   . Hypertension Sister   . Diabetes Brother        x 2  . Hyperlipidemia Brother   . Hypertension Brother   . Diabetes Brother   . Hyperlipidemia Brother   . Hypertension Brother   . CAD Sister   . Arthritis Sister   . Hyperlipidemia Sister   . Hypertension Sister   . Colon cancer Neg Hx   . Esophageal cancer Neg Hx   . Rectal cancer Neg Hx   . Stomach cancer Neg Hx   . Pancreatic cancer Neg Hx     Current Outpatient Medications (Endocrine & Metabolic):  .  glyBURIDE (DIABETA) 2.5 MG tablet, TAKE 1  TABLET BY MOUTH EVERY DAY WITH BREAKFAST  Current Outpatient Medications (Cardiovascular):  .  amLODipine (NORVASC) 10 MG tablet, TAKE 1 TABLET BY MOUTH EVERY DAY .  hydrochlorothiazide (HYDRODIURIL) 12.5 MG tablet, TAKE 1 TABLET BY MOUTH EVERY DAY .  losartan (COZAAR) 100 MG tablet, TAKE 1 TABLET BY MOUTH EVERY DAY .  losartan-hydrochlorothiazide (HYZAAR) 100-12.5 MG tablet, TAKE 1 TABLET EVERY DAY .  lovastatin (MEVACOR) 20 MG tablet, Take 1 tablet by mouth daily at bedtime. Please defer future requests to new PCP.  Current Outpatient Medications (Respiratory):  .  albuterol (VENTOLIN HFA) 108 (90 Base) MCG/ACT inhaler, Inhale 1-2 puffs into the lungs every 6 (six) hours as needed for wheezing or shortness of breath. .  chlorpheniramine (CHLOR-TRIMETON) 4 MG tablet, Take 2 tabs by mouth at bedtime and every 4 hours as needed for drainage/drippy nose/sneezing .  fluticasone (FLONASE) 50 MCG/ACT nasal spray, USE 1 SPRAY IN EACH NOSTRIL TWICE DAILY .  montelukast (SINGULAIR) 10 MG tablet, Take 1 tablet (10 mg total) by mouth at bedtime.  Current Outpatient Medications (Analgesics):  .  acetaminophen (TYLENOL) 325 MG tablet, Take 650 mg by mouth every 6 (six) hours as needed. Marland Kitchen  aspirin 81 MG tablet, Take 81 mg by mouth every morning.  .  meloxicam (MOBIC) 7.5 MG tablet, TAKE 1 TABLET BY MOUTH EVERY DAY .  traMADol (ULTRAM) 50 MG tablet, Take 1 tablet (50 mg total) by mouth every 12 (twelve) hours as needed for up to 5 days.   Current Outpatient Medications (Other):  Marland Kitchen  ALPRAZolam (XANAX) 0.5 MG tablet, Take 1 tablet by mouth every 8 hours if needed for anxiety. .  Calcium Citrate-Vitamin D (CALCIUM + D PO), Take 1 tablet by mouth every morning. .  cyclobenzaprine (FLEXERIL) 5 MG tablet, Take 1-2 tablets (5-10 mg total) by mouth 3 (three) times daily as needed for muscle spasms. .  fluocinonide ointment (LIDEX) 3.38 %, Apply 1 application topically as needed.  .  gabapentin (NEURONTIN) 100  MG capsule, TAKE 2 CAPSULES (200 MG TOTAL) BY MOUTH AT BEDTIME. Marland Kitchen  glucose blood (ONE TOUCH ULTRA TEST) test strip, 1 each by Other route daily. Use 1 strips to check blood sugar twice a day Ex E11.9 .  Lancets (ONETOUCH ULTRASOFT) lancets, PRN .  mometasone (ELOCON) 0.1 % ointment,  .  Multiple Vitamins-Minerals (CENTRUM SILVER PO),  Take 1 tablet by mouth every morning. Marland Kitchen  omeprazole (PRILOSEC) 40 MG capsule, Take 1 capsule (40 mg total) by mouth daily. .  Potassium Chloride ER 20 MEQ TBCR, Take 1 tablet by mouth daily. Marland Kitchen  Respiratory Therapy Supplies (FLUTTER) DEVI, Use as directed .  sertraline (ZOLOFT) 100 MG tablet, TAKE 1 TABLET IN THE MORNING .  valACYclovir (VALTREX) 1000 MG tablet, Take 2 tabs by mouth every 12 hours for one day. .  valACYclovir (VALTREX) 500 MG tablet, Take 500 mg by mouth 2 (two) times daily. .  Vitamin D, Ergocalciferol, (DRISDOL) 1.25 MG (50000 UT) CAPS capsule, TAKE 1 CAPSULE (50,000 UNITS TOTAL) BY MOUTH EVERY 7 (SEVEN) DAYS.    Past medical history, social, surgical and family history all reviewed in electronic medical record.  No pertanent information unless stated regarding to the chief complaint.   Review of Systems:  No headache, visual changes, nausea, vomiting, diarrhea, constipation, dizziness, abdominal pain, skin rash, fevers, chills, night sweats, weight loss, swollen lymph nodes, body aches, joint swelling,  chest pain, shortness of breath, mood changes.  Positive muscle aches  Objective  Blood pressure 126/82, pulse 81, height 5' (1.524 m), weight 159 lb (72.1 kg), SpO2 99 %.    General: No apparent distress alert and oriented x3 mood and affect normal, dressed appropriately.  HEENT: Pupils equal, extraocular movements intact  Respiratory: Patient's speak in full sentences and does not appear short of breath  Cardiovascular: No lower extremity edema, non tender, no erythema  Skin: Warm dry intact with no signs of infection or rash on  extremities or on axial skeleton.  Abdomen: Soft nontender  Neuro: Cranial nerves II through XII are intact, neurovascularly intact in all extremities with 2+ pulses.  Lymph: No lymphadenopathy of posterior or anterior cervical chain or axillae bilaterally.  Gait normal with good balance and coordination.  MSK: Arthritic changes of multiple joints  Back exam shows some loss of lordosis.  Positive straight leg test on the right side.  1+ DTR of the Achilles compared to the contralateral side.  Patient does have 4-5 strength with dorsiflexion on the right compared to the contralateral side.  These are new findings.    Impression and Recommendations:     This case required medical decision making of moderate complexity. The above documentation has been reviewed and is accurate and complete Lyndal Pulley, DO       Note: This dictation was prepared with Dragon dictation along with smaller phrase technology. Any transcriptional errors that result from this process are unintentional.

## 2019-04-13 NOTE — Patient Instructions (Signed)
good to see you  Give you an injection to calm it down  Tramadol if you really need it but be careful with this medicine.  Lets get the MRI and then we will talk the next step s

## 2019-04-14 ENCOUNTER — Other Ambulatory Visit: Payer: Self-pay | Admitting: Internal Medicine

## 2019-04-19 ENCOUNTER — Other Ambulatory Visit: Payer: Self-pay

## 2019-04-19 ENCOUNTER — Telehealth: Payer: Self-pay

## 2019-04-19 ENCOUNTER — Ambulatory Visit
Admission: RE | Admit: 2019-04-19 | Discharge: 2019-04-19 | Disposition: A | Discharge status: Home / Self Care | Payer: Medicare Other | Source: Ambulatory Visit | Attending: Family Medicine | Admitting: Family Medicine

## 2019-04-19 DIAGNOSIS — M545 Low back pain, unspecified: Secondary | ICD-10-CM

## 2019-04-19 DIAGNOSIS — M5416 Radiculopathy, lumbar region: Secondary | ICD-10-CM

## 2019-04-19 NOTE — Telephone Encounter (Signed)
Spoke with patient in regards to trying a CT scan instead. Patient voices understanding and would like to proceed.

## 2019-04-20 ENCOUNTER — Inpatient Hospital Stay: Admission: RE | Admit: 2019-04-20 | Payer: Medicare Other | Source: Ambulatory Visit

## 2019-04-27 ENCOUNTER — Other Ambulatory Visit: Payer: Self-pay | Admitting: Internal Medicine

## 2019-05-02 ENCOUNTER — Other Ambulatory Visit: Payer: Self-pay | Admitting: Internal Medicine

## 2019-05-03 ENCOUNTER — Ambulatory Visit: Payer: Self-pay | Admitting: Family Medicine

## 2019-05-03 NOTE — Telephone Encounter (Signed)
Please advise 

## 2019-05-03 NOTE — Telephone Encounter (Signed)
  Pt. Reports for the past 4 days her blood sugars have been >200. Usually run 140-160. Has had back pain and extra stress from  A sick family member. States she has been very careful with her diet. Please advise. Contact number [phone]. Answer Assessment - Initial Assessment Questions 1. BLOOD GLUCOSE: "What is your blood glucose level?"      Over 200 x 4 days 2. ONSET: "When did you check the blood glucose?"     Checks in the morning and at night 3. USUAL RANGE: "What is your glucose level usually?" (e.g., usual fasting morning value, usual evening value)     Usually 140 160 4. KETONES: "Do you check for ketones (urine or blood test strips)?" If yes, ask: "What does the test show now?"      No 5. TYPE 1 or 2:  "Do you know what type of diabetes you have?"  (e.g., Type 1, Type 2, Gestational; doesn't know)      Type 2 6. INSULIN: "Do you take insulin?" "What type of insulin(s) do you use? What is the mode of delivery? (syringe, pen (e.g., injection or  pump)?"      No 7. DIABETES PILLS: "Do you take any pills for your diabetes?" If yes, ask: "Have you missed taking any pills recently?"     No missed 8. OTHER SYMPTOMS: "Do you have any symptoms?" (e.g., fever, frequent urination, difficulty breathing, dizziness, weakness, vomiting)     No 9. PREGNANCY: "Is there any chance you are pregnant?" "When was your last menstrual period?"     No  Protocols used: DIABETES - HIGH BLOOD SUGAR-A-AH

## 2019-05-03 NOTE — Telephone Encounter (Signed)
Is she feeling ill in any way, did she over indulge with the holiday. Have her increase her Glyburide 2.5 mg twice daily x 3 days then decrease back down to once daily unless numbers stay above 200. Then schedule her an appt to discuss other options.

## 2019-05-04 NOTE — Telephone Encounter (Signed)
Reviewed physician's note with patient. She will begin taking the glyburide 2.5mg  twice/day for three days and keep track of her sugars. She has appointment on 05/17/19. Understands she can call back with concerns/questions.

## 2019-05-04 NOTE — Telephone Encounter (Signed)
Called left message for patient to call the office back   Nurse triage may handle  

## 2019-05-04 NOTE — Telephone Encounter (Signed)
Patient called by for physician's advice (see 7/6 note). She hung up while waiting to transfer.

## 2019-05-06 ENCOUNTER — Other Ambulatory Visit: Payer: Self-pay | Admitting: Family Medicine

## 2019-05-13 ENCOUNTER — Other Ambulatory Visit: Payer: Self-pay

## 2019-05-13 ENCOUNTER — Ambulatory Visit
Admission: RE | Admit: 2019-05-13 | Discharge: 2019-05-13 | Disposition: A | Discharge status: Home / Self Care | Payer: Medicare Other | Source: Ambulatory Visit | Attending: Family Medicine | Admitting: Family Medicine

## 2019-05-13 DIAGNOSIS — M545 Low back pain, unspecified: Secondary | ICD-10-CM

## 2019-05-13 DIAGNOSIS — G8929 Other chronic pain: Secondary | ICD-10-CM | POA: Diagnosis not present

## 2019-05-13 DIAGNOSIS — M48061 Spinal stenosis, lumbar region without neurogenic claudication: Secondary | ICD-10-CM | POA: Diagnosis not present

## 2019-05-13 MED ORDER — DIAZEPAM 5 MG PO TABS
5.0000 mg | ORAL_TABLET | Freq: Once | ORAL | Status: AC
  Administered 2019-05-13: 5 mg via ORAL

## 2019-05-13 MED ORDER — IOPAMIDOL (ISOVUE-M 200) INJECTION 41%
20.0000 mL | Freq: Once | INTRAMUSCULAR | Status: AC
  Administered 2019-05-13: 14:00:00 20 mL via INTRATHECAL

## 2019-05-13 NOTE — Discharge Instructions (Signed)

## 2019-05-14 ENCOUNTER — Telehealth: Payer: Self-pay | Admitting: Physical Therapy

## 2019-05-14 NOTE — Telephone Encounter (Signed)
Per a verbal, Dr. Tamala Julian will advise patient through MyChart message.

## 2019-05-14 NOTE — Telephone Encounter (Signed)
Pt called to inquire about her CT results.  Please advise.

## 2019-05-17 ENCOUNTER — Ambulatory Visit (INDEPENDENT_AMBULATORY_CARE_PROVIDER_SITE_OTHER): Payer: Medicare Other | Admitting: Family Medicine

## 2019-05-17 ENCOUNTER — Other Ambulatory Visit: Payer: Self-pay

## 2019-05-17 ENCOUNTER — Telehealth: Payer: Self-pay

## 2019-05-17 ENCOUNTER — Telehealth: Payer: Self-pay | Admitting: Family Medicine

## 2019-05-17 DIAGNOSIS — R05 Cough: Secondary | ICD-10-CM

## 2019-05-17 DIAGNOSIS — E1165 Type 2 diabetes mellitus with hyperglycemia: Secondary | ICD-10-CM | POA: Diagnosis not present

## 2019-05-17 DIAGNOSIS — J310 Chronic rhinitis: Secondary | ICD-10-CM

## 2019-05-17 DIAGNOSIS — I10 Essential (primary) hypertension: Secondary | ICD-10-CM

## 2019-05-17 DIAGNOSIS — E785 Hyperlipidemia, unspecified: Secondary | ICD-10-CM

## 2019-05-17 DIAGNOSIS — E1140 Type 2 diabetes mellitus with diabetic neuropathy, unspecified: Secondary | ICD-10-CM

## 2019-05-17 DIAGNOSIS — E1169 Type 2 diabetes mellitus with other specified complication: Secondary | ICD-10-CM | POA: Diagnosis not present

## 2019-05-17 DIAGNOSIS — E559 Vitamin D deficiency, unspecified: Secondary | ICD-10-CM

## 2019-05-17 DIAGNOSIS — M5416 Radiculopathy, lumbar region: Secondary | ICD-10-CM

## 2019-05-17 DIAGNOSIS — F418 Other specified anxiety disorders: Secondary | ICD-10-CM

## 2019-05-17 DIAGNOSIS — R058 Other specified cough: Secondary | ICD-10-CM

## 2019-05-17 DIAGNOSIS — IMO0002 Reserved for concepts with insufficient information to code with codable children: Secondary | ICD-10-CM

## 2019-05-17 DIAGNOSIS — R059 Cough, unspecified: Secondary | ICD-10-CM

## 2019-05-17 DIAGNOSIS — K219 Gastro-esophageal reflux disease without esophagitis: Secondary | ICD-10-CM

## 2019-05-17 NOTE — Assessment & Plan Note (Signed)
Supplement and monitor 

## 2019-05-17 NOTE — Assessment & Plan Note (Signed)
Encouraged to check vitals weekly, no changes to meds. Encouraged heart healthy diet such as the DASH diet and exercise as tolerated.

## 2019-05-17 NOTE — Telephone Encounter (Signed)
Spoke with patient. She would like to try the epidural. Will call for a f/u once epidural is set.

## 2019-05-17 NOTE — Assessment & Plan Note (Signed)
hgba1c acceptable, minimize simple carbs. Increase exercise as tolerated. Continue current meds 

## 2019-05-17 NOTE — Assessment & Plan Note (Signed)
Had intermittent cough historically but it has not flared recently. She should report any concerns.

## 2019-05-17 NOTE — Telephone Encounter (Signed)
LVM to return call to schedule 2 month f/u from 05/17/19 visit.

## 2019-05-17 NOTE — Progress Notes (Signed)
Virtual Visit via Video Note  I connected with Heidi Franklin on 05/17/19 at  2:40 PM EDT by a video enabled telemedicine application and verified that I am speaking with the correct person using two identifiers.  Location: Patient: home Provider: home   I discussed the limitations of evaluation and management by telemedicine and the availability of in person appointments. The patient expressed understanding and agreed to proceed. Heidi Franklin was able to get patient set up on visit, video    Subjective:    Patient ID: Heidi Franklin, female    DOB: 05/06/1946, 73 y.o.   MRN: [id number]  No chief complaint on file.   HPI Patient is in today for follow up on chronic medical concerns inclduing hyperglycemia, hyperlipidemia, hypertension and more. She feels well but stressed today. She is caring for her ill sister in law with cancer. No recent febrile illness or hospitalizations. No polyuria or polydipsia. Denies CP/palp/SOB/HA/congestion/fevers/GI or GU c/o. Taking meds as prescribed  Past Medical History:  Diagnosis Date  . ALLERGIC RHINITIS   . Allergy   . Anxiety state, unspecified   . Arthritis   . Asthma   . BRONCHITIS, CHRONIC   . DEPRESSION   . DIABETES MELLITUS, TYPE II   . GERD   . HYPERLIPIDEMIA   . HYPERTENSION   . Psoriasis     Past Surgical History:  Procedure Laterality Date  . ABDOMINAL HYSTERECTOMY  1980  . TRIGGER FINGER RELEASE     Index finger    Family History  Problem Relation Age of Onset  . Stroke Mother   . Angina Mother   . Hyperlipidemia Mother   . Hypertension Mother   . Heart disease Mother   . Depression Mother   . Diabetes Father   . Hyperlipidemia Other        Parent  . Hypertension Other        Parent  . Diabetes Sister        x1  . CAD Sister   . COPD Sister        smoker  . Hyperlipidemia Sister   . Hypertension Sister   . Diabetes Brother        x 2  . Hyperlipidemia Brother   . Hypertension Brother   . Diabetes  Brother   . Hyperlipidemia Brother   . Hypertension Brother   . CAD Sister   . Arthritis Sister   . Hyperlipidemia Sister   . Hypertension Sister   . Colon cancer Neg Hx   . Esophageal cancer Neg Hx   . Rectal cancer Neg Hx   . Stomach cancer Neg Hx   . Pancreatic cancer Neg Hx     Social History   Socioeconomic History  . Marital status: Married    Spouse name: Not on file  . Number of children: 0  . Years of education: 46yr colge  . Highest education level: Not on file  Occupational History  . Occupation: Retired   Scientific laboratory technician  . Financial resource strain: Somewhat hard  . Food insecurity    Worry: Sometimes true    Inability: Sometimes true  . Transportation needs    Medical: No    Non-medical: No  Tobacco Use  . Smoking status: Never Smoker  . Smokeless tobacco: Never Used  Substance and Sexual Activity  . Alcohol use: No    Alcohol/week: 0.0 standard drinks  . Drug use: No  . Sexual activity: Not Currently  Lifestyle  . Physical  activity    Days per week: 0 days    Minutes per session: 0 min  . Stress: Very much  Relationships  . Social connections    Talks on phone: More than three times a week    Gets together: More than three times a week    Attends religious service: More than 4 times per year    Active member of club or organization: Yes    Attends meetings of clubs or organizations: More than 4 times per year    Relationship status: Married  . Intimate partner violence    Fear of current or ex partner: No    Emotionally abused: No    Physically abused: No    Forced sexual activity: No  Other Topics Concern  . Not on file  Social History Narrative   Married, lives with spouse-retired from [employer] insurance   Not employed    Drinks coffee occasional, Consumes 1 soda a day    No dietary restrictions    Outpatient Medications Prior to Visit  Medication Sig Dispense Refill  . acetaminophen (TYLENOL) 325 MG tablet Take 650 mg by mouth every 6  (six) hours as needed.    Marland Kitchen albuterol (VENTOLIN HFA) 108 (90 Base) MCG/ACT inhaler Inhale 1-2 puffs into the lungs every 6 (six) hours as needed for wheezing or shortness of breath. 1 Inhaler 3  . ALPRAZolam (XANAX) 0.5 MG tablet Take 1 tablet by mouth every 8 hours if needed for anxiety. 30 tablet 0  . amLODipine (NORVASC) 10 MG tablet TAKE 1 TABLET BY MOUTH EVERY DAY 90 tablet 1  . aspirin 81 MG tablet Take 81 mg by mouth every morning.     . Calcium Citrate-Vitamin D (CALCIUM + D PO) Take 1 tablet by mouth every morning.    . chlorpheniramine (CHLOR-TRIMETON) 4 MG tablet Take 2 tabs by mouth at bedtime and every 4 hours as needed for drainage/drippy nose/sneezing    . cyclobenzaprine (FLEXERIL) 5 MG tablet Take 1-2 tablets (5-10 mg total) by mouth 3 (three) times daily as needed for muscle spasms. 30 tablet 0  . fluocinonide ointment (LIDEX) 8.33 % Apply 1 application topically as needed.     . fluticasone (FLONASE) 50 MCG/ACT nasal spray USE 1 SPRAY IN EACH NOSTRIL TWICE DAILY 48 g 1  . gabapentin (NEURONTIN) 100 MG capsule TAKE 2 CAPSULES (200 MG TOTAL) BY MOUTH AT BEDTIME. 180 capsule 2  . glucose blood (ONE TOUCH ULTRA TEST) test strip 1 each by Other route daily. Use 1 strips to check blood sugar twice a day Ex E11.9 100 each 3  . glyBURIDE (DIABETA) 2.5 MG tablet TAKE 1 TABLET BY MOUTH EVERY DAY WITH BREAKFAST 90 tablet 0  . hydrochlorothiazide (HYDRODIURIL) 12.5 MG tablet TAKE 1 TABLET BY MOUTH EVERY DAY 30 tablet 5  . Lancets (ONETOUCH ULTRASOFT) lancets PRN 100 each 3  . losartan (COZAAR) 100 MG tablet TAKE 1 TABLET BY MOUTH EVERY DAY 90 tablet 1  . lovastatin (MEVACOR) 20 MG tablet TAKE 1 TABLET BY MOUTH EVERY DAY AT BEDTIME. PLEASE SEND FUTURE REQUESTS TO NEW DOCTOR. 30 tablet 0  . meloxicam (MOBIC) 7.5 MG tablet TAKE 1 TABLET BY MOUTH EVERY DAY 30 tablet 0  . mometasone (ELOCON) 0.1 % ointment     . montelukast (SINGULAIR) 10 MG tablet Take 1 tablet (10 mg total) by mouth at  bedtime. 30 tablet 5  . Multiple Vitamins-Minerals (CENTRUM SILVER PO) Take 1 tablet by mouth every morning.    Marland Kitchen  omeprazole (PRILOSEC) 40 MG capsule Take 1 capsule (40 mg total) by mouth daily. 90 capsule 1  . Potassium Chloride ER 20 MEQ TBCR TAKE 1 TABLET BY MOUTH EVERY DAY 90 tablet 1  . Respiratory Therapy Supplies (FLUTTER) DEVI Use as directed 1 each 0  . valACYclovir (VALTREX) 500 MG tablet Take 500 mg by mouth 2 (two) times daily.  0  . Vitamin D, Ergocalciferol, (DRISDOL) 1.25 MG (50000 UT) CAPS capsule TAKE 1 CAPSULE (50,000 UNITS TOTAL) BY MOUTH EVERY 7 (SEVEN) DAYS. 12 capsule 0   No facility-administered medications prior to visit.     Allergies  Allergen Reactions  . Metformin And Related Diarrhea  . Penicillins Rash    Review of Systems  Constitutional: Positive for malaise/fatigue. Negative for fever.  HENT: Negative for congestion.   Eyes: Negative for blurred vision.  Respiratory: Positive for cough. Negative for shortness of breath.   Cardiovascular: Negative for chest pain, palpitations and leg swelling.  Gastrointestinal: Negative for abdominal pain, blood in stool and nausea.  Genitourinary: Negative for dysuria and frequency.  Musculoskeletal: Positive for back pain. Negative for falls.  Skin: Negative for rash.  Neurological: Negative for dizziness, loss of consciousness and headaches.  Endo/Heme/Allergies: Negative for environmental allergies.  Psychiatric/Behavioral: Positive for depression. The patient is nervous/anxious.        Objective:    Physical Exam Constitutional:      Appearance: Normal appearance. She is obese. She is not ill-appearing.  HENT:     Head: Normocephalic and atraumatic.     Nose: Nose normal.  Eyes:     General:        Right eye: No discharge.        Left eye: No discharge.  Pulmonary:     Effort: Pulmonary effort is normal.  Neurological:     Mental Status: She is alert and oriented to person, place, and time.   Psychiatric:        Mood and Affect: Mood normal.        Behavior: Behavior normal.     There were no vitals taken for this visit. Wt Readings from Last 3 Encounters:  04/13/19 159 lb (72.1 kg)  03/17/19 159 lb (72.1 kg)  11/26/18 157 lb (71.2 kg)    Diabetic Foot Exam - Simple   No data filed     Lab Results  Component Value Date   WBC 11.6 (H) 07/06/2018   HGB 11.7 (L) 07/06/2018   HCT 35.3 (L) 07/06/2018   PLT 358.0 07/06/2018   GLUCOSE 111 (H) 07/06/2018   CHOL 149 02/10/2018   TRIG 155.0 (H) 02/10/2018   HDL 33.50 (L) 02/10/2018   LDLDIRECT 155.1 02/09/2013   LDLCALC 84 02/10/2018   ALT 20 07/06/2018   AST 21 07/06/2018   NA 139 07/06/2018   K 3.8 07/06/2018   CL 103 07/06/2018   CREATININE 0.89 07/06/2018   BUN 24 (H) 07/06/2018   CO2 28 07/06/2018   TSH 2.85 02/10/2018   HGBA1C 6.7 (H) 07/06/2018   MICROALBUR 1.6 08/08/2016    Lab Results  Component Value Date   TSH 2.85 02/10/2018   Lab Results  Component Value Date   WBC 11.6 (H) 07/06/2018   HGB 11.7 (L) 07/06/2018   HCT 35.3 (L) 07/06/2018   MCV 86.2 07/06/2018   PLT 358.0 07/06/2018   Lab Results  Component Value Date   NA 139 07/06/2018   K 3.8 07/06/2018   CO2 28 07/06/2018   GLUCOSE  111 (H) 07/06/2018   BUN 24 (H) 07/06/2018   CREATININE 0.89 07/06/2018   BILITOT 0.3 07/06/2018   ALKPHOS 69 07/06/2018   AST 21 07/06/2018   ALT 20 07/06/2018   PROT 8.3 07/06/2018   ALBUMIN 4.3 07/06/2018   CALCIUM 10.4 07/06/2018   ANIONGAP 7 10/14/2015   GFR 79.89 07/06/2018   Lab Results  Component Value Date   CHOL 149 02/10/2018   Lab Results  Component Value Date   HDL 33.50 (L) 02/10/2018   Lab Results  Component Value Date   LDLCALC 84 02/10/2018   Lab Results  Component Value Date   TRIG 155.0 (H) 02/10/2018   Lab Results  Component Value Date   CHOLHDL 4 02/10/2018   Lab Results  Component Value Date   HGBA1C 6.7 (H) 07/06/2018       Assessment & Plan:    Problem List Items Addressed This Visit    Type 2 diabetes, uncontrolled, with neuropathy ([place])    hgba1c acceptable, minimize simple carbs. Increase exercise as tolerated. Continue current meds      Hyperlipidemia associated with type 2 diabetes mellitus ([place])    Tolerating statin, encouraged heart healthy diet, avoid trans fats, minimize simple carbs and saturated fats. Increase exercise as tolerated      Depression with anxiety    Is under great stress and caring for her ill sister in law in her home at this time. She is managing fairly well all things considered. She will let us know if it worsens.       Essential hypertension    Encouraged to check vitals weekly, no changes to meds. Encouraged heart healthy diet such as the DASH diet and exercise as tolerated.       Upper airway cough syndrome    Had intermittent cough historically but it has not flared recently. She should report any concerns.       GERD    Avoid offending foods, start probiotics. Do not eat large meals in late evening and consider raising head of bed.       Vitamin D deficiency    Supplement and monitor         I am having Heidi Franklin maintain her aspirin, chlorpheniramine, Multiple Vitamins-Minerals (CENTRUM SILVER PO), Flutter, Calcium Citrate-Vitamin D (CALCIUM + D PO), glucose blood, onetouch ultrasoft, fluocinonide ointment, mometasone, acetaminophen, fluticasone, albuterol, valACYclovir, amLODipine, ALPRAZolam, omeprazole, losartan, cyclobenzaprine, glyBURIDE, gabapentin, montelukast, hydrochlorothiazide, Vitamin D (Ergocalciferol), Potassium Chloride ER, lovastatin, and meloxicam.  No orders of the defined types were placed in this encounter.   I discussed the assessment and treatment plan with the patient. The patient was provided an opportunity to ask questions and all were answered. The patient agreed with the plan and demonstrated an understanding of the instructions.   The patient was advised  to call back or seek an in-person evaluation if the symptoms worsen or if the condition fails to improve as anticipated.  I provided 25 minutes of non-face-to-face time during this encounter.   Penni Homans, MD

## 2019-05-17 NOTE — Assessment & Plan Note (Signed)
Avoid offending foods, start probiotics. Do not eat large meals in late evening and consider raising head of bed.  

## 2019-05-17 NOTE — Assessment & Plan Note (Signed)
Tolerating statin, encouraged heart healthy diet, avoid trans fats, minimize simple carbs and saturated fats. Increase exercise as tolerated 

## 2019-05-17 NOTE — Assessment & Plan Note (Signed)
Is under great stress and caring for her ill sister in law in her home at this time. She is managing fairly well all things considered. She will let us know if it worsens.

## 2019-05-24 ENCOUNTER — Other Ambulatory Visit: Payer: Self-pay | Admitting: Family Medicine

## 2019-05-25 ENCOUNTER — Telehealth: Payer: Self-pay | Admitting: Family Medicine

## 2019-05-25 NOTE — Telephone Encounter (Signed)
Pt called in to request to have a Rx for  Lancets  glucose blood test strips Test Meter     sent to the below   Pharmacy:  [id number] - [place], [place] [phone] (Phone) [phone] (Fax)

## 2019-05-26 MED ORDER — GLUCOSE BLOOD VI STRP: 1.0000 | ORAL_STRIP | Freq: Every day | 3 refills | Status: DC

## 2019-05-26 MED ORDER — ONETOUCH ULTRASOFT LANCETS MISC: 3 refills | Status: DC

## 2019-05-26 NOTE — Telephone Encounter (Signed)
Medications sent in.

## 2019-05-27 ENCOUNTER — Other Ambulatory Visit: Payer: Self-pay

## 2019-05-27 ENCOUNTER — Ambulatory Visit
Admission: RE | Admit: 2019-05-27 | Discharge: 2019-05-27 | Disposition: A | Discharge status: Home / Self Care | Payer: Medicare Other | Source: Ambulatory Visit | Attending: Family Medicine | Admitting: Family Medicine

## 2019-05-27 DIAGNOSIS — M5116 Intervertebral disc disorders with radiculopathy, lumbar region: Secondary | ICD-10-CM | POA: Diagnosis not present

## 2019-05-27 DIAGNOSIS — M545 Low back pain: Secondary | ICD-10-CM | POA: Diagnosis not present

## 2019-05-27 DIAGNOSIS — M5416 Radiculopathy, lumbar region: Secondary | ICD-10-CM

## 2019-05-27 MED ORDER — METHYLPREDNISOLONE ACETATE 40 MG/ML INJ SUSP (RADIOLOG
120.0000 mg | Freq: Once | INTRAMUSCULAR | Status: AC
  Administered 2019-05-27: 120 mg via INTRA_ARTICULAR

## 2019-05-27 MED ORDER — IOPAMIDOL (ISOVUE-M 200) INJECTION 41%
1.0000 mL | Freq: Once | INTRAMUSCULAR | Status: AC
  Administered 2019-05-27: 1 mL via INTRA_ARTICULAR

## 2019-05-27 NOTE — Discharge Instructions (Signed)

## 2019-06-01 NOTE — Addendum Note (Signed)
Addended by: Magdalene Molly A on: 06/01/2019 04:08 PM   Modules accepted: Orders

## 2019-06-03 ENCOUNTER — Encounter: Payer: Self-pay | Admitting: Family Medicine

## 2019-06-04 ENCOUNTER — Other Ambulatory Visit: Payer: Self-pay | Admitting: Internal Medicine

## 2019-06-04 ENCOUNTER — Other Ambulatory Visit: Payer: Self-pay

## 2019-06-04 MED ORDER — MELOXICAM 7.5 MG PO TABS: 7.5000 mg | ORAL_TABLET | Freq: Every day | ORAL | 0 refills | Status: DC

## 2019-06-08 ENCOUNTER — Other Ambulatory Visit: Payer: Self-pay | Admitting: Internal Medicine

## 2019-06-16 ENCOUNTER — Telehealth: Payer: Self-pay | Admitting: *Deleted

## 2019-06-16 NOTE — Telephone Encounter (Signed)
Patient called stating that she received her facet injection on 05/27/19 & did not receive any relief. She did pick up the meloxicam from the pharmacy & has been taking that the last few days and can tell a slight difference. Pt would like to know what should the next step be.

## 2019-06-17 NOTE — Telephone Encounter (Signed)
lmovm for pt to return call.  

## 2019-06-17 NOTE — Telephone Encounter (Signed)
I would consider labs to make sure nothing else is causing the pain and then either try another injection at a level lower or physical therapy

## 2019-06-18 NOTE — Telephone Encounter (Signed)
Pt called to return call. Please advise.  

## 2019-06-19 ENCOUNTER — Other Ambulatory Visit: Payer: Self-pay | Admitting: Family Medicine

## 2019-06-21 ENCOUNTER — Other Ambulatory Visit (INDEPENDENT_AMBULATORY_CARE_PROVIDER_SITE_OTHER): Payer: Medicare Other

## 2019-06-21 ENCOUNTER — Other Ambulatory Visit: Payer: Self-pay

## 2019-06-21 DIAGNOSIS — E559 Vitamin D deficiency, unspecified: Secondary | ICD-10-CM

## 2019-06-21 DIAGNOSIS — E038 Other specified hypothyroidism: Secondary | ICD-10-CM | POA: Diagnosis not present

## 2019-06-21 DIAGNOSIS — M2550 Pain in unspecified joint: Secondary | ICD-10-CM

## 2019-06-21 DIAGNOSIS — R7989 Other specified abnormal findings of blood chemistry: Secondary | ICD-10-CM

## 2019-06-21 LAB — C-REACTIVE PROTEIN: CRP: 3.8 mg/dL (ref 0.5–20.0)

## 2019-06-21 LAB — COMPREHENSIVE METABOLIC PANEL
ALT: 25 U/L (ref 0–35)
AST: 15 U/L (ref 0–37)
Albumin: 3.8 g/dL (ref 3.5–5.2)
Alkaline Phosphatase: 57 U/L (ref 39–117)
BUN: 15 mg/dL (ref 6–23)
CO2: 29 mEq/L (ref 19–32)
Calcium: 9.2 mg/dL (ref 8.4–10.5)
Chloride: 105 mEq/L (ref 96–112)
Creatinine, Ser: 0.82 mg/dL (ref 0.40–1.20)
GFR: 82.4 mL/min (ref >60.00)
Glucose, Bld: 119 mg/dL — ABNORMAL HIGH (ref 70–99)
Potassium: 3.7 mEq/L (ref 3.5–5.1)
Sodium: 141 mEq/L (ref 135–145)
Total Bilirubin: 0.3 mg/dL (ref 0.2–1.2)
Total Protein: 6.8 g/dL (ref 6.0–8.3)

## 2019-06-21 LAB — SEDIMENTATION RATE: Sed Rate: 36 mm/hr — ABNORMAL HIGH (ref 0–30)

## 2019-06-21 LAB — CBC WITH DIFFERENTIAL/PLATELET
Basophils Absolute: 0 10*3/uL (ref 0.0–0.1)
Basophils Relative: 0.5 % (ref 0.0–3.0)
Eosinophils Absolute: 0.1 10*3/uL (ref 0.0–0.7)
Eosinophils Relative: 1.6 % (ref 0.0–5.0)
HCT: 34.6 % — ABNORMAL LOW (ref 36.0–46.0)
Hemoglobin: 11.4 g/dL — ABNORMAL LOW (ref 12.0–15.0)
Lymphocytes Relative: 18.7 % (ref 12.0–46.0)
Lymphs Abs: 1.5 10*3/uL (ref 0.7–4.0)
MCHC: 32.9 g/dL (ref 30.0–36.0)
MCV: 90.7 fl (ref 78.0–100.0)
Monocytes Absolute: 0.6 10*3/uL (ref 0.1–1.0)
Monocytes Relative: 7.7 % (ref 3.0–12.0)
Neutro Abs: 5.9 10*3/uL (ref 1.4–7.7)
Neutrophils Relative %: 71.5 % (ref 43.0–77.0)
Platelets: 187 10*3/uL (ref 150.0–400.0)
RBC: 3.82 Mil/uL — ABNORMAL LOW (ref 3.87–5.11)
RDW: 14.6 % (ref 11.5–15.5)
WBC: 8.3 10*3/uL (ref 4.0–10.5)

## 2019-06-21 LAB — URIC ACID: Uric Acid, Serum: 4.5 mg/dL (ref 2.4–7.0)

## 2019-06-21 LAB — IBC PANEL
Iron: 56 ug/dL (ref 42–145)
Saturation Ratios: 15.2 % — ABNORMAL LOW (ref 20.0–50.0)
Transferrin: 263 mg/dL (ref 212.0–360.0)

## 2019-06-21 NOTE — Telephone Encounter (Signed)
Pt returned call.  Please call pt on cell: [phone]

## 2019-06-21 NOTE — Telephone Encounter (Signed)
She would like to get labs drawn. What labs would you like ordered?

## 2019-06-22 ENCOUNTER — Telehealth: Payer: Self-pay | Admitting: *Deleted

## 2019-06-22 LAB — VITAMIN D 25 HYDROXY (VIT D DEFICIENCY, FRACTURES): VITD: 85.14 ng/mL (ref 30.00–100.00)

## 2019-06-22 LAB — TSH: TSH: 2.07 u[IU]/mL (ref 0.35–4.50)

## 2019-06-22 LAB — FERRITIN: Ferritin: 102.6 ng/mL (ref 10.0–291.0)

## 2019-06-22 NOTE — Telephone Encounter (Signed)
Pt called requesting lab results

## 2019-06-24 LAB — RHEUMATOID FACTOR: Rheumatoid fact SerPl-aCnc: <14 IU/mL (ref <14)

## 2019-06-24 LAB — ANGIOTENSIN CONVERTING ENZYME: Angiotensin-Converting Enzyme: 24 U/L (ref 9–67)

## 2019-06-24 LAB — CALCIUM, IONIZED: Calcium, Ion: 5.33 mg/dL (ref 4.8–5.6)

## 2019-06-24 LAB — PTH, INTACT AND CALCIUM
Calcium: 9.4 mg/dL (ref 8.6–10.4)
PTH: 34 pg/mL (ref 14–64)

## 2019-06-24 LAB — CYCLIC CITRUL PEPTIDE ANTIBODY, IGG: Cyclic Citrullin Peptide Ab: <16 UNITS

## 2019-06-24 LAB — ANA: Anti Nuclear Antibody (ANA): NEGATIVE

## 2019-06-24 NOTE — Telephone Encounter (Signed)
Pt called to speak with Dr. Tamala Julian or nurse about lab results and the findings of what is going on with her back/Pt would like a phone call today please advise

## 2019-06-25 DIAGNOSIS — Z23 Encounter for immunization: Secondary | ICD-10-CM | POA: Diagnosis not present

## 2019-06-25 NOTE — Telephone Encounter (Signed)
Pt calling to check status. Please advise  °

## 2019-06-25 NOTE — Telephone Encounter (Signed)
Pt would like to know what the next step is?

## 2019-06-25 NOTE — Telephone Encounter (Signed)
PT and if not then referral to nuerosurgery or could give option of Dr. Maryjean Ka

## 2019-06-28 NOTE — Telephone Encounter (Signed)
Spoke with patient and she would like to discuss with husband before making decision.

## 2019-06-28 NOTE — Telephone Encounter (Signed)
Please call patient back on lab work results an discuss options

## 2019-06-29 ENCOUNTER — Other Ambulatory Visit: Payer: Self-pay | Admitting: Family Medicine

## 2019-06-30 NOTE — Telephone Encounter (Signed)
Left message for patient to call back to schedule appointment to answer questions.

## 2019-06-30 NOTE — Telephone Encounter (Signed)
Pt left msg stating that she was extremely confused as to what she the plan is for her. She has questions about referral to neurosurgery and would like her labs explained to her in "detail" in reference to her anemia.

## 2019-06-30 NOTE — Telephone Encounter (Signed)
Patient scheduled Friday at 8:45am.

## 2019-07-02 ENCOUNTER — Encounter: Payer: Self-pay | Admitting: Family Medicine

## 2019-07-02 ENCOUNTER — Ambulatory Visit (INDEPENDENT_AMBULATORY_CARE_PROVIDER_SITE_OTHER): Payer: Medicare Other | Admitting: Family Medicine

## 2019-07-02 ENCOUNTER — Other Ambulatory Visit: Payer: Self-pay

## 2019-07-02 DIAGNOSIS — M5416 Radiculopathy, lumbar region: Secondary | ICD-10-CM

## 2019-07-02 NOTE — Patient Instructions (Signed)
Miralax 17g daily Colace 100 mg 1-2 x a day Drop iron and vitamin c to 3x a week Call in a couple weeks

## 2019-07-02 NOTE — Progress Notes (Signed)
[facility] [address] [place], [place] [address] Phone: [phone] Subjective:   Heidi Franklin, am serving as a scribe for Dr. Hulan Saas.   CC: Low back pain  [id number]  Heidi Franklin is a 73 y.o. female coming in with complaint of lumbar spine pain. Pain is in left glute and into the left side of thoracic spine. Pain is constant. Wears a compression belt for pain relief. CT 05/13/2019.  CT scan was independently visualized by me showing the patient did have some facet arthropathy at L4-L5 mild to moderate in severity with some mild foraminal narrowing but otherwise fairly unremarkable. Patient has had a laboratory work-up secondary to her pain being out of proportion to findings that was only consistent with a mild anemia that seem to be chronic or iron deficient.  Has been taking iron but has noticed some increasing constipation recently.  Denies any fevers, chills, any abnormal weight loss    Past Medical History:  Diagnosis Date  . ALLERGIC RHINITIS   . Allergy   . Anxiety state, unspecified   . Arthritis   . Asthma   . BRONCHITIS, CHRONIC   . DEPRESSION   . DIABETES MELLITUS, TYPE II   . GERD   . HYPERLIPIDEMIA   . HYPERTENSION   . Psoriasis    Past Surgical History:  Procedure Laterality Date  . ABDOMINAL HYSTERECTOMY  1980  . TRIGGER FINGER RELEASE     Index finger   Social History   Socioeconomic History  . Marital status: Married    Spouse name: Not on file  . Number of children: 0  . Years of education: 20yr colge  . Highest education level: Not on file  Occupational History  . Occupation: Retired   Scientific laboratory technician  . Financial resource strain: Somewhat hard  . Food insecurity    Worry: Sometimes true    Inability: Sometimes true  . Transportation needs    Medical: Franklin    Non-medical: Franklin  Tobacco Use  . Smoking status: Never Smoker  . Smokeless tobacco: Never Used  Substance and Sexual Activity  . Alcohol use: Franklin     Alcohol/week: 0.0 standard drinks  . Drug use: Franklin  . Sexual activity: Not Currently  Lifestyle  . Physical activity    Days per week: 0 days    Minutes per session: 0 min  . Stress: Very much  Relationships  . Social connections    Talks on phone: More than three times a week    Gets together: More than three times a week    Attends religious service: More than 4 times per year    Active member of club or organization: Yes    Attends meetings of clubs or organizations: More than 4 times per year    Relationship status: Married  Other Topics Concern  . Not on file  Social History Narrative   Married, lives with spouse-retired from [employer] insurance   Not employed    Drinks coffee occasional, Consumes 1 soda a day    Franklin dietary restrictions   Allergies  Allergen Reactions  . Metformin And Related Diarrhea  . Penicillins Rash   Family History  Problem Relation Age of Onset  . Stroke Mother   . Angina Mother   . Hyperlipidemia Mother   . Hypertension Mother   . Heart disease Mother   . Depression Mother   . Diabetes Father   . Hyperlipidemia Other  Parent  . Hypertension Other        Parent  . Diabetes Sister        x1  . CAD Sister   . COPD Sister        smoker  . Hyperlipidemia Sister   . Hypertension Sister   . Diabetes Brother        x 2  . Hyperlipidemia Brother   . Hypertension Brother   . Diabetes Brother   . Hyperlipidemia Brother   . Hypertension Brother   . CAD Sister   . Arthritis Sister   . Hyperlipidemia Sister   . Hypertension Sister   . Colon cancer Neg Hx   . Esophageal cancer Neg Hx   . Rectal cancer Neg Hx   . Stomach cancer Neg Hx   . Pancreatic cancer Neg Hx     Current Outpatient Medications (Endocrine & Metabolic):  .  glyBURIDE (DIABETA) 2.5 MG tablet, TAKE 1 TABLET BY MOUTH EVERY DAY WITH BREAKFAST  Current Outpatient Medications (Cardiovascular):  .  amLODipine (NORVASC) 10 MG tablet, TAKE 1 TABLET BY MOUTH EVERY DAY  .  hydrochlorothiazide (HYDRODIURIL) 12.5 MG tablet, TAKE 1 TABLET BY MOUTH EVERY DAY .  losartan (COZAAR) 100 MG tablet, TAKE 1 TABLET BY MOUTH EVERY DAY .  lovastatin (MEVACOR) 20 MG tablet, TAKE 1 TABLET BY MOUTH EVERY DAY AT BEDTIME. PLEASE SEND FUTURE REQUESTS TO NEW DOCTOR.  Current Outpatient Medications (Respiratory):  .  albuterol (VENTOLIN HFA) 108 (90 Base) MCG/ACT inhaler, Inhale 1-2 puffs into the lungs every 6 (six) hours as needed for wheezing or shortness of breath. .  montelukast (SINGULAIR) 10 MG tablet, Take 1 tablet (10 mg total) by mouth at bedtime.  Current Outpatient Medications (Analgesics):  .  acetaminophen (TYLENOL) 325 MG tablet, Take 650 mg by mouth every 6 (six) hours as needed. .  meloxicam (MOBIC) 7.5 MG tablet, Take 1 tablet (7.5 mg total) by mouth daily.   Current Outpatient Medications (Other):  Marland Kitchen  ALPRAZolam (XANAX) 0.5 MG tablet, Take 1 tablet by mouth every 8 hours if needed for anxiety. .  cyclobenzaprine (FLEXERIL) 5 MG tablet, Take 1-2 tablets (5-10 mg total) by mouth 3 (three) times daily as needed for muscle spasms. Marland Kitchen  gabapentin (NEURONTIN) 100 MG capsule, TAKE 2 CAPSULES (200 MG TOTAL) BY MOUTH AT BEDTIME. Marland Kitchen  glucose blood (ONE TOUCH ULTRA TEST) test strip, 1 each by Other route daily. Use 1 strips to check blood sugar twice a day Ex E11.9 .  Lancets (ONETOUCH ULTRASOFT) lancets, PRN .  mometasone (ELOCON) 0.1 % ointment,  .  Multiple Vitamins-Minerals (CENTRUM SILVER PO), Take 1 tablet by mouth every morning. Marland Kitchen  omeprazole (PRILOSEC) 40 MG capsule, TAKE 1 CAPSULE BY MOUTH EVERY DAY .  Potassium Chloride ER 20 MEQ TBCR, TAKE 1 TABLET BY MOUTH EVERY DAY .  Respiratory Therapy Supplies (FLUTTER) DEVI, Use as directed .  valACYclovir (VALTREX) 500 MG tablet, Take 500 mg by mouth 2 (two) times daily. .  Vitamin D, Ergocalciferol, (DRISDOL) 1.25 MG (50000 UT) CAPS capsule, TAKE 1 CAPSULE (50,000 UNITS TOTAL) BY MOUTH EVERY 7 (SEVEN) DAYS.    Past  medical history, social, surgical and family history all reviewed in electronic medical record.  Franklin pertanent information unless stated regarding to the chief complaint.   Review of Systems:  Franklin headache, visual changes, nausea, vomiting, diarrhea, constipation, dizziness, abdominal pain, skin rash, fevers, chills, night sweats, weight loss, swollen lymph nodes, body aches, joint swelling, chest pain,  shortness of breath, mood changes.  Positive muscle aches  Objective  Blood pressure 110/64, pulse 88, height 5' (1.524 m), weight 153 lb (69.4 kg), SpO2 97 %. f    General: Franklin apparent distress alert and oriented x3 mood and affect normal, dressed appropriately.  HEENT: Pupils equal, extraocular movements intact  Respiratory: Patient's speak in full sentences and does not appear short of breath  Cardiovascular: Trace lower extremity edema, non tender, Franklin erythema  Skin: Warm dry intact with Franklin signs of infection or rash on extremities or on axial skeleton.  Abdomen: Soft nontender  Neuro: Cranial nerves II through XII are intact, neurovascularly intact in all extremities with 2+ DTRs and 2+ pulses.  Lymph: Franklin lymphadenopathy of posterior or anterior cervical chain or axillae bilaterally.  Gait mild antalgic MSK:  tender with mild limited range of motion and good stability and symmetric strength and tone of shoulders, elbows, wrist, hip, knee and ankles bilaterally.    Back  exam does have some mild loss of lordosis.  Discussed with patient about core strengthening and stability. Patient has tightness of the hamstrings bilaterally and tightness of the [facility] on the left side.  Worsening pain to the back with extension.  Pain does seem to be out of proportion to the amount of palpation.    Impression and Recommendations:     This case required medical decision making of moderate complexity. The above documentation has been reviewed and is accurate and complete Lyndal Pulley, DO        Note: This dictation was prepared with Dragon dictation along with smaller phrase technology. Any transcriptional errors that result from this process are unintentional.

## 2019-07-02 NOTE — Assessment & Plan Note (Signed)
Patient has had lumbar radiculopathy but now seems to be more localized for the facet arthropathy.  CT scan did show this.  Consistent with patient's symptoms.  We discussed with patient in greater detail about the possibility of other injections including radiofrequency ablation.  We also discussed the possible referral to neurosurgery to discuss other treatment options that could be possible for this individual.  Due to patient's symptoms being out of proportion to the amount of arthritic changes and patient's laboratory work-up being unremarkable I am concerned that there could be another cause such as constipation that could be contributing to some of her discomfort and pain.  We discussed different medications that could be contributing to this as well as over-the-counter medications that could help her with more regular symptoms.  Patient will try to make these different changes and see me again in 4 weeks.  Spent  25 minutes with patient face-to-face and had greater than 50% of counseling including as described above in assessment and plan.

## 2019-07-03 ENCOUNTER — Other Ambulatory Visit: Payer: Self-pay | Admitting: Internal Medicine

## 2019-07-08 ENCOUNTER — Telehealth: Payer: Self-pay

## 2019-07-08 NOTE — Telephone Encounter (Signed)
Let's set her up for an appointment virtually to discuss options. Needs a hemeoccult set up to evaluate anemia.

## 2019-07-08 NOTE — Telephone Encounter (Signed)
Copied from [place] [id number]. Topic: General - Inquiry >> Jun 28, 2019 10:23 AM Virl Axe D wrote: Reason for CRM: Pt stated she has been seeing Dr. Tamala Julian at [facility] for her back. Stated that she was told there was not a solution to her problem and that she may need to seek referral to a neurosurgeon. Pt would like to know what Dr. Charlett Blake recommends. Stated she was told she had anemia and needed to increase iron intake. Pt stated she felt like things were not really explained to her and she doesn't know what to do next. Please advise.

## 2019-07-12 ENCOUNTER — Encounter: Payer: Self-pay | Admitting: Family Medicine

## 2019-07-12 NOTE — Telephone Encounter (Signed)
Appt scheduled 07/20/2019.

## 2019-07-20 ENCOUNTER — Ambulatory Visit (INDEPENDENT_AMBULATORY_CARE_PROVIDER_SITE_OTHER): Payer: Medicare Other | Admitting: Family Medicine

## 2019-07-20 ENCOUNTER — Other Ambulatory Visit: Payer: Self-pay

## 2019-07-20 ENCOUNTER — Encounter: Payer: Self-pay | Admitting: Family Medicine

## 2019-07-20 ENCOUNTER — Other Ambulatory Visit: Payer: Self-pay | Admitting: Family Medicine

## 2019-07-20 VITALS — BP 100/56 | HR 89 | Temp 96.7°F | Resp 18 | Wt 151.6 lb

## 2019-07-20 DIAGNOSIS — I10 Essential (primary) hypertension: Secondary | ICD-10-CM | POA: Diagnosis not present

## 2019-07-20 DIAGNOSIS — IMO0002 Reserved for concepts with insufficient information to code with codable children: Secondary | ICD-10-CM

## 2019-07-20 DIAGNOSIS — M545 Low back pain, unspecified: Secondary | ICD-10-CM

## 2019-07-20 DIAGNOSIS — E1165 Type 2 diabetes mellitus with hyperglycemia: Secondary | ICD-10-CM | POA: Diagnosis not present

## 2019-07-20 DIAGNOSIS — E1169 Type 2 diabetes mellitus with other specified complication: Secondary | ICD-10-CM | POA: Diagnosis not present

## 2019-07-20 DIAGNOSIS — E1140 Type 2 diabetes mellitus with diabetic neuropathy, unspecified: Secondary | ICD-10-CM

## 2019-07-20 DIAGNOSIS — E559 Vitamin D deficiency, unspecified: Secondary | ICD-10-CM | POA: Diagnosis not present

## 2019-07-20 DIAGNOSIS — G8929 Other chronic pain: Secondary | ICD-10-CM

## 2019-07-20 DIAGNOSIS — D649 Anemia, unspecified: Secondary | ICD-10-CM | POA: Diagnosis not present

## 2019-07-20 DIAGNOSIS — E785 Hyperlipidemia, unspecified: Secondary | ICD-10-CM | POA: Diagnosis not present

## 2019-07-20 LAB — COMPREHENSIVE METABOLIC PANEL
ALT: 16 U/L (ref 0–35)
AST: 17 U/L (ref 0–37)
Albumin: 3.8 g/dL (ref 3.5–5.2)
Alkaline Phosphatase: 65 U/L (ref 39–117)
BUN: 12 mg/dL (ref 6–23)
CO2: 29 mEq/L (ref 19–32)
Calcium: 9.5 mg/dL (ref 8.4–10.5)
Chloride: 104 mEq/L (ref 96–112)
Creatinine, Ser: 0.78 mg/dL (ref 0.40–1.20)
GFR: 87.27 mL/min (ref >60.00)
Glucose, Bld: 133 mg/dL — ABNORMAL HIGH (ref 70–99)
Potassium: 3.7 mEq/L (ref 3.5–5.1)
Sodium: 141 mEq/L (ref 135–145)
Total Bilirubin: 0.4 mg/dL (ref 0.2–1.2)
Total Protein: 6.6 g/dL (ref 6.0–8.3)

## 2019-07-20 LAB — CBC
HCT: 35.8 % — ABNORMAL LOW (ref 36.0–46.0)
Hemoglobin: 11.6 g/dL — ABNORMAL LOW (ref 12.0–15.0)
MCHC: 32.4 g/dL (ref 30.0–36.0)
MCV: 91.7 fl (ref 78.0–100.0)
Platelets: 235 10*3/uL (ref 150.0–400.0)
RBC: 3.91 Mil/uL (ref 3.87–5.11)
RDW: 14 % (ref 11.5–15.5)
WBC: 10.7 10*3/uL — ABNORMAL HIGH (ref 4.0–10.5)

## 2019-07-20 LAB — LIPID PANEL
Cholesterol: 168 mg/dL (ref 0–200)
HDL: 37.6 mg/dL — ABNORMAL LOW (ref >39.00)
LDL Cholesterol: 105 mg/dL — ABNORMAL HIGH (ref 0–99)
NonHDL: 130.83
Total CHOL/HDL Ratio: 4
Triglycerides: 128 mg/dL (ref 0.0–149.0)
VLDL: 25.6 mg/dL (ref 0.0–40.0)

## 2019-07-20 LAB — HEMOGLOBIN A1C: Hgb A1c MFr Bld: 6.6 % — ABNORMAL HIGH (ref 4.6–6.5)

## 2019-07-20 MED ORDER — GABAPENTIN 100 MG PO CAPS: 100.0000 mg | ORAL_CAPSULE | Freq: Three times a day (TID) | ORAL | 2 refills | Status: DC

## 2019-07-20 MED ORDER — TIZANIDINE HCL 2 MG PO TABS: 1.0000 mg | ORAL_TABLET | Freq: Two times a day (BID) | ORAL | 1 refills | Status: DC | PRN

## 2019-07-20 NOTE — Patient Instructions (Addendum)
Tylenol/Acetaminophen ES 500 mg (max of 3000 mg in 24 hours). Start with 1 tab 3 x a day  Miralax with Benefiber once to twice daily as needed  [facility]   Chronic Back Pain When back pain lasts longer than 3 months, it is called chronic back pain.The cause of your back pain may not be known. Some common causes include:  Wear and tear (degenerative disease) of the bones, ligaments, or disks in your back.  Inflammation and stiffness in your back (arthritis). People who have chronic back pain often go through certain periods in which the pain is more intense (flare-ups). Many people can learn to manage the pain with home care. Follow these instructions at home: Pay attention to any changes in your symptoms. Take these actions to help with your pain: Activity   Avoid bending and other activities that make the problem worse.  Maintain a proper position when standing or sitting: ? When standing, keep your upper back and neck straight, with your shoulders pulled back. Avoid slouching. ? When sitting, keep your back straight and relax your shoulders. Do not round your shoulders or pull them backward.  Do not sit or stand in one place for long periods of time.  Take brief periods of rest throughout the day. This will reduce your pain. Resting in a lying or standing position is usually better than sitting to rest.  When you are resting for longer periods, mix in some mild activity or stretching between periods of rest. This will help to prevent stiffness and pain.  Get regular exercise. Ask your health care provider what activities are safe for you.  Do not lift anything that is heavier than 10 lb (4.5 kg). Always use proper lifting technique, which includes: ? Bending your knees. ? Keeping the load close to your body. ? Avoiding twisting.  Sleep on a firm mattress in a comfortable position. Try lying on your side with your knees slightly bent. If you lie on your back, put a  pillow under your knees. Managing pain  If directed, apply ice to the painful area. Your health care provider may recommend applying ice during the first 24-48 hours after a flare-up begins. ? Put ice in a plastic bag. ? Place a towel between your skin and the bag. ? Leave the ice on for 20 minutes, 2-3 times per day.  If directed, apply heat to the affected area as often as told by your health care provider. Use the heat source that your health care provider recommends, such as a moist heat pack or a heating pad. ? Place a towel between your skin and the heat source. ? Leave the heat on for 20-30 minutes. ? Remove the heat if your skin turns bright red. This is especially important if you are unable to feel pain, heat, or cold. You may have a greater risk of getting burned.  Try soaking in a warm tub.  Take over-the-counter and prescription medicines only as told by your health care provider.  Keep all follow-up visits as told by your health care provider. This is important. Contact a health care provider if:  You have pain that is not relieved with rest or medicine. Get help right away if:  You have weakness or numbness in one or both of your legs or feet.  You have trouble controlling your bladder or your bowels.  You have nausea or vomiting.  You have pain in your abdomen.  You have shortness of breath or  you faint. This information is not intended to replace advice given to you by your health care provider. Make sure you discuss any questions you have with your health care provider. Document Released: 11/21/2004 Document Revised: 02/04/2019 Document Reviewed: 04/23/2017 Elsevier Patient Education  2020 [employer].

## 2019-07-23 ENCOUNTER — Encounter: Payer: Self-pay | Admitting: Family Medicine

## 2019-07-23 NOTE — Assessment & Plan Note (Signed)
Supplement and monitor 

## 2019-07-23 NOTE — Assessment & Plan Note (Signed)
Has been following with sports medicine but still has ongoing pain, will use Gabapentin and Tizanidine prn and reasses.

## 2019-07-23 NOTE — Assessment & Plan Note (Signed)
Well controlled, no changes to meds. Encouraged heart healthy diet such as the DASH diet and exercise as tolerated.  °

## 2019-07-23 NOTE — Assessment & Plan Note (Signed)
Tolerating statin, encouraged heart healthy diet, avoid trans fats, minimize simple carbs and saturated fats. Increase exercise as tolerated 

## 2019-07-23 NOTE — Progress Notes (Signed)
Subjective:    Patient ID: Heidi Franklin, female    DOB: 1946/01/20, 73 y.o.   MRN: [id number]  No chief complaint on file.   HPI Patient is in today for follow up on chronic medical concerns including hypertension, diabetes, low back pain and more. She continues to follow with Sports Medicine but still has some pain, Gabapentin has been somewhat helpful but still uncomfortable each day. No recent fall or trauma. No incontinence, polyuria or polydipsia. Denies CP/palp/SOB/HA/congestion/fevers/GI or GU c/o. Taking meds as prescribed  Past Medical History:  Diagnosis Date  . ALLERGIC RHINITIS   . Allergy   . Anxiety state, unspecified   . Arthritis   . Asthma   . BRONCHITIS, CHRONIC   . Chronic low back pain 10/09/2016  . DEPRESSION   . DIABETES MELLITUS, TYPE II   . GERD   . HYPERLIPIDEMIA   . HYPERTENSION   . Psoriasis     Past Surgical History:  Procedure Laterality Date  . ABDOMINAL HYSTERECTOMY  1980  . TRIGGER FINGER RELEASE     Index finger    Family History  Problem Relation Age of Onset  . Stroke Mother   . Angina Mother   . Hyperlipidemia Mother   . Hypertension Mother   . Heart disease Mother   . Depression Mother   . Diabetes Father   . Hyperlipidemia Other        Parent  . Hypertension Other        Parent  . Diabetes Sister        x1  . CAD Sister   . COPD Sister        smoker  . Hyperlipidemia Sister   . Hypertension Sister   . Diabetes Brother        x 2  . Hyperlipidemia Brother   . Hypertension Brother   . Diabetes Brother   . Hyperlipidemia Brother   . Hypertension Brother   . CAD Sister   . Arthritis Sister   . Hyperlipidemia Sister   . Hypertension Sister   . Colon cancer Neg Hx   . Esophageal cancer Neg Hx   . Rectal cancer Neg Hx   . Stomach cancer Neg Hx   . Pancreatic cancer Neg Hx     Social History   Socioeconomic History  . Marital status: Married    Spouse name: Not on file  . Number of children: 0  . Years of  education: 13yr colge  . Highest education level: Not on file  Occupational History  . Occupation: Retired   Scientific laboratory technician  . Financial resource strain: Somewhat hard  . Food insecurity    Worry: Sometimes true    Inability: Sometimes true  . Transportation needs    Medical: No    Non-medical: No  Tobacco Use  . Smoking status: Never Smoker  . Smokeless tobacco: Never Used  Substance and Sexual Activity  . Alcohol use: No    Alcohol/week: 0.0 standard drinks  . Drug use: No  . Sexual activity: Not Currently  Lifestyle  . Physical activity    Days per week: 0 days    Minutes per session: 0 min  . Stress: Very much  Relationships  . Social connections    Talks on phone: More than three times a week    Gets together: More than three times a week    Attends religious service: More than 4 times per year    Active member of club or  organization: Yes    Attends meetings of clubs or organizations: More than 4 times per year    Relationship status: Married  . Intimate partner violence    Fear of current or ex partner: No    Emotionally abused: No    Physically abused: No    Forced sexual activity: No  Other Topics Concern  . Not on file  Social History Narrative   Married, lives with spouse-retired from [employer] insurance   Not employed    Drinks coffee occasional, Consumes 1 soda a day    No dietary restrictions    Outpatient Medications Prior to Visit  Medication Sig Dispense Refill  . acetaminophen (TYLENOL) 325 MG tablet Take 650 mg by mouth every 6 (six) hours as needed.    Marland Kitchen albuterol (VENTOLIN HFA) 108 (90 Base) MCG/ACT inhaler Inhale 1-2 puffs into the lungs every 6 (six) hours as needed for wheezing or shortness of breath. 1 Inhaler 3  . ALPRAZolam (XANAX) 0.5 MG tablet Take 1 tablet by mouth every 8 hours if needed for anxiety. 30 tablet 0  . amLODipine (NORVASC) 10 MG tablet TAKE 1 TABLET BY MOUTH EVERY DAY 90 tablet 1  . glucose blood (ONE TOUCH ULTRA TEST) test  strip 1 each by Other route daily. Use 1 strips to check blood sugar twice a day Ex E11.9 100 each 3  . glyBURIDE (DIABETA) 2.5 MG tablet TAKE 1 TABLET BY MOUTH EVERY DAY WITH BREAKFAST 90 tablet 0  . hydrochlorothiazide (HYDRODIURIL) 12.5 MG tablet TAKE 1 TABLET BY MOUTH EVERY DAY 30 tablet 5  . Lancets (ONETOUCH ULTRASOFT) lancets PRN 100 each 3  . losartan (COZAAR) 100 MG tablet TAKE 1 TABLET BY MOUTH EVERY DAY 90 tablet 1  . meloxicam (MOBIC) 7.5 MG tablet Take 1 tablet (7.5 mg total) by mouth daily. 30 tablet 0  . mometasone (ELOCON) 0.1 % ointment     . montelukast (SINGULAIR) 10 MG tablet Take 1 tablet (10 mg total) by mouth at bedtime. 30 tablet 5  . Multiple Vitamins-Minerals (CENTRUM SILVER PO) Take 1 tablet by mouth every morning.    Marland Kitchen omeprazole (PRILOSEC) 40 MG capsule TAKE 1 CAPSULE BY MOUTH EVERY DAY 90 capsule 1  . Potassium Chloride ER 20 MEQ TBCR TAKE 1 TABLET BY MOUTH EVERY DAY 90 tablet 1  . Respiratory Therapy Supplies (FLUTTER) DEVI Use as directed 1 each 0  . valACYclovir (VALTREX) 500 MG tablet Take 500 mg by mouth 2 (two) times daily.  0  . Vitamin D, Ergocalciferol, (DRISDOL) 1.25 MG (50000 UT) CAPS capsule TAKE 1 CAPSULE (50,000 UNITS TOTAL) BY MOUTH EVERY 7 (SEVEN) DAYS. 12 capsule 0  . cyclobenzaprine (FLEXERIL) 5 MG tablet Take 1-2 tablets (5-10 mg total) by mouth 3 (three) times daily as needed for muscle spasms. 30 tablet 0  . gabapentin (NEURONTIN) 100 MG capsule TAKE 2 CAPSULES (200 MG TOTAL) BY MOUTH AT BEDTIME. 180 capsule 2  . lovastatin (MEVACOR) 20 MG tablet TAKE 1 TABLET BY MOUTH EVERY DAY AT BEDTIME. PLEASE SEND FUTURE REQUESTS TO NEW DOCTOR. 30 tablet 0   No facility-administered medications prior to visit.     Allergies  Allergen Reactions  . Metformin And Related Diarrhea  . Penicillins Rash    Review of Systems  Constitutional: Negative for fever and malaise/fatigue.  HENT: Negative for congestion.   Eyes: Negative for blurred vision.   Respiratory: Negative for shortness of breath.   Cardiovascular: Negative for chest pain, palpitations and leg swelling.  Gastrointestinal: Negative for abdominal pain, blood in stool and nausea.  Genitourinary: Negative for dysuria and frequency.  Musculoskeletal: Positive for back pain. Negative for falls.  Skin: Negative for rash.  Neurological: Negative for dizziness, loss of consciousness and headaches.  Endo/Heme/Allergies: Negative for environmental allergies.  Psychiatric/Behavioral: Negative for depression. The patient is not nervous/anxious.        Objective:    Physical Exam Vitals signs and nursing note reviewed.  Constitutional:      General: She is not in acute distress.    Appearance: She is well-developed.  HENT:     Head: Normocephalic and atraumatic.     Nose: Nose normal.  Eyes:     General:        Right eye: No discharge.        Left eye: No discharge.  Neck:     Musculoskeletal: Normal range of motion and neck supple.  Cardiovascular:     Rate and Rhythm: Normal rate and regular rhythm.     Heart sounds: No murmur.  Pulmonary:     Effort: Pulmonary effort is normal.     Breath sounds: Normal breath sounds.  Abdominal:     General: Bowel sounds are normal.     Palpations: Abdomen is soft.     Tenderness: There is no abdominal tenderness.  Skin:    General: Skin is warm and dry.  Neurological:     Mental Status: She is alert and oriented to person, place, and time.     BP (!) 100/56 (BP Location: Left Arm, Patient Position: Sitting, Cuff Size: Normal)   Pulse 89   Temp (!) 96.7 F (35.9 C) (Temporal)   Resp 18   Wt 151 lb 9.6 oz (68.8 kg)   SpO2 99%   BMI 29.61 kg/m  Wt Readings from Last 3 Encounters:  07/20/19 151 lb 9.6 oz (68.8 kg)  07/02/19 153 lb (69.4 kg)  04/13/19 159 lb (72.1 kg)    Diabetic Foot Exam - Simple   No data filed     Lab Results  Component Value Date   WBC 10.7 (H) 07/20/2019   HGB 11.6 (L) 07/20/2019   HCT  35.8 (L) 07/20/2019   PLT 235.0 07/20/2019   GLUCOSE 133 (H) 07/20/2019   CHOL 168 07/20/2019   TRIG 128.0 07/20/2019   HDL 37.60 (L) 07/20/2019   LDLDIRECT 155.1 02/09/2013   LDLCALC 105 (H) 07/20/2019   ALT 16 07/20/2019   AST 17 07/20/2019   NA 141 07/20/2019   K 3.7 07/20/2019   CL 104 07/20/2019   CREATININE 0.78 07/20/2019   BUN 12 07/20/2019   CO2 29 07/20/2019   TSH 2.07 06/21/2019   HGBA1C 6.6 (H) 07/20/2019   MICROALBUR 1.6 08/08/2016    Lab Results  Component Value Date   TSH 2.07 06/21/2019   Lab Results  Component Value Date   WBC 10.7 (H) 07/20/2019   HGB 11.6 (L) 07/20/2019   HCT 35.8 (L) 07/20/2019   MCV 91.7 07/20/2019   PLT 235.0 07/20/2019   Lab Results  Component Value Date   NA 141 07/20/2019   K 3.7 07/20/2019   CO2 29 07/20/2019   GLUCOSE 133 (H) 07/20/2019   BUN 12 07/20/2019   CREATININE 0.78 07/20/2019   BILITOT 0.4 07/20/2019   ALKPHOS 65 07/20/2019   AST 17 07/20/2019   ALT 16 07/20/2019   PROT 6.6 07/20/2019   ALBUMIN 3.8 07/20/2019   CALCIUM 9.5 07/20/2019   ANIONGAP 7  10/14/2015   GFR 87.27 07/20/2019   Lab Results  Component Value Date   CHOL 168 07/20/2019   Lab Results  Component Value Date   HDL 37.60 (L) 07/20/2019   Lab Results  Component Value Date   LDLCALC 105 (H) 07/20/2019   Lab Results  Component Value Date   TRIG 128.0 07/20/2019   Lab Results  Component Value Date   CHOLHDL 4 07/20/2019   Lab Results  Component Value Date   HGBA1C 6.6 (H) 07/20/2019       Assessment & Plan:   Problem List Items Addressed This Visit    Type 2 diabetes, uncontrolled, with neuropathy ([place]) - Primary    hgba1c acceptable, minimize simple carbs. Increase exercise as tolerated. Continue current meds      Relevant Orders   Hemoglobin A1c (Completed)   Hyperlipidemia associated with type 2 diabetes mellitus ([place])    Tolerating statin, encouraged heart healthy diet, avoid trans fats, minimize simple carbs and  saturated fats. Increase exercise as tolerated      Relevant Orders   Lipid panel (Completed)   Essential hypertension    Well controlled, no changes to meds. Encouraged heart healthy diet such as the DASH diet and exercise as tolerated.       Relevant Orders   Comprehensive metabolic panel (Completed)   Chronic low back pain    Has been following with sports medicine but still has ongoing pain, will use Gabapentin and Tizanidine prn and reasses.       Relevant Medications   tiZANidine (ZANAFLEX) 2 MG tablet   Anemia    Check hemeoccults and labs and continue to monitor      Relevant Orders   CBC (Completed)   Fecal occult blood, imunochemical([facility])   Vitamin D deficiency    Supplement and monitor         I have discontinued Baeli Klang's cyclobenzaprine. I have also changed her gabapentin. Additionally, I am having her start on tiZANidine. Lastly, I am having her maintain her Multiple Vitamins-Minerals (CENTRUM SILVER PO), Flutter, mometasone, acetaminophen, albuterol, valACYclovir, ALPRAZolam, losartan, montelukast, hydrochlorothiazide, Potassium Chloride ER, onetouch ultrasoft, glucose blood, omeprazole, meloxicam, glyBURIDE, Vitamin D (Ergocalciferol), and amLODipine.  Meds ordered this encounter  Medications  . tiZANidine (ZANAFLEX) 2 MG tablet    Sig: Take 0.5-2 tablets (1-4 mg total) by mouth 2 (two) times daily as needed for muscle spasms.    Dispense:  40 tablet    Refill:  1  . gabapentin (NEURONTIN) 100 MG capsule    Sig: Take 1 capsule (100 mg total) by mouth 3 (three) times daily.    Dispense:  180 capsule    Refill:  2     Penni Homans, MD

## 2019-07-23 NOTE — Assessment & Plan Note (Signed)
hgba1c acceptable, minimize simple carbs. Increase exercise as tolerated. Continue current meds 

## 2019-07-23 NOTE — Assessment & Plan Note (Signed)
Check hemeoccults and labs and continue to monitor

## 2019-08-03 ENCOUNTER — Encounter: Payer: Self-pay | Admitting: Sports Medicine

## 2019-08-03 ENCOUNTER — Ambulatory Visit (INDEPENDENT_AMBULATORY_CARE_PROVIDER_SITE_OTHER): Payer: Medicare Other | Admitting: Sports Medicine

## 2019-08-03 ENCOUNTER — Other Ambulatory Visit: Payer: Self-pay

## 2019-08-03 DIAGNOSIS — E119 Type 2 diabetes mellitus without complications: Secondary | ICD-10-CM | POA: Diagnosis not present

## 2019-08-03 DIAGNOSIS — M79674 Pain in right toe(s): Secondary | ICD-10-CM | POA: Diagnosis not present

## 2019-08-03 DIAGNOSIS — M79675 Pain in left toe(s): Secondary | ICD-10-CM | POA: Diagnosis not present

## 2019-08-03 DIAGNOSIS — B351 Tinea unguium: Secondary | ICD-10-CM

## 2019-08-03 NOTE — Progress Notes (Signed)
Subjective: Heidi Franklin is a 73 y.o. female patient with history of diabetes who presents to office today complaining of long,mildly painful nails  while ambulating in shoes; unable to trim. Patient states that the glucose reading this morning was 136 mg/dl.  Last A1C 6 0.6.  Patient denies any new changes in medication or new problems.  Patient Active Problem List   Diagnosis Date Noted  . History of chicken pox 04/11/2019  . Vitamin D deficiency 04/05/2019  . Lumbar radiculopathy 03/31/2019  . Wheezing 02/25/2019  . Neck pain 07/06/2018  . Trapezius muscle strain, right, initial encounter 06/23/2018  . Edema 06/23/2018  . Acute gout 04/08/2018  . Herpes simplex 02/10/2018  . ETD (eustachian tube dysfunction) 02/10/2018  . Peroneal tendinitis, right 01/06/2018  . Psoriasis 11/14/2017  . Acute bursitis of right shoulder 09/29/2017  . Bilateral hip pain 07/10/2017  . Bronchiectasis without complication ([place]) [id number]  . Irritable larynx syndrome 04/25/2017  . Reflux laryngitis 04/23/2017  . Benign lipomatous neoplasm of skin and subcutaneous tissue of right leg 02/25/2017  . Anemia 02/05/2017  . Fatigue 01/23/2017  . Cough 01/02/2017  . Multiple pulmonary nodules 11/10/2016  . Chronic low back pain 10/09/2016  . Degenerative arthritis of right knee 06/20/2016  . Degenerative arthritis of left knee 04/10/2016  . Peroneal tendinitis of left lower extremity 09/05/2015  . Recurrent falls 08/08/2015  . Bursitis of left shoulder 07/13/2015  . Morbid obesity due to excess calories ([place]) 04/16/2015  . Nonspecific abnormal electrocardiogram (ECG) (EKG) 05/11/2011  . Tenosynovitis of finger 04/22/2011  . Type 2 diabetes, uncontrolled, with neuropathy ([place]) 03/02/2010  . Hyperlipidemia associated with type 2 diabetes mellitus ([place]) 03/02/2010  . Depression with anxiety 03/02/2010  . Essential hypertension 03/02/2010  . Chronic rhinitis 03/02/2010  . Upper airway cough syndrome  03/02/2010  . GERD 03/02/2010   Current Outpatient Medications on File Prior to Visit  Medication Sig Dispense Refill  . acetaminophen (TYLENOL) 325 MG tablet Take 650 mg by mouth every 6 (six) hours as needed.    Marland Kitchen albuterol (VENTOLIN HFA) 108 (90 Base) MCG/ACT inhaler Inhale 1-2 puffs into the lungs every 6 (six) hours as needed for wheezing or shortness of breath. 1 Inhaler 3  . ALPRAZolam (XANAX) 0.5 MG tablet Take 1 tablet by mouth every 8 hours if needed for anxiety. 30 tablet 0  . amLODipine (NORVASC) 10 MG tablet TAKE 1 TABLET BY MOUTH EVERY DAY 90 tablet 1  . gabapentin (NEURONTIN) 100 MG capsule Take 1 capsule (100 mg total) by mouth 3 (three) times daily. 180 capsule 2  . glucose blood (ONE TOUCH ULTRA TEST) test strip 1 each by Other route daily. Use 1 strips to check blood sugar twice a day Ex E11.9 100 each 3  . glyBURIDE (DIABETA) 2.5 MG tablet TAKE 1 TABLET BY MOUTH EVERY DAY WITH BREAKFAST 90 tablet 0  . hydrochlorothiazide (HYDRODIURIL) 12.5 MG tablet TAKE 1 TABLET BY MOUTH EVERY DAY 30 tablet 5  . ketoconazole (NIZORAL) 2 % cream 1 APPLICATION APPLY ON THE SKIN TWICE A DAY APPLY 2 WEEKS ON, 1 WEEK OFF. REPEAT AS NEEDED.    Marland Kitchen Lancets (ONETOUCH ULTRASOFT) lancets PRN 100 each 3  . losartan (COZAAR) 100 MG tablet TAKE 1 TABLET BY MOUTH EVERY DAY 90 tablet 1  . lovastatin (MEVACOR) 20 MG tablet TAKE 1 TABLET BY MOUTH EVERY DAY AT BEDTIME. PLEASE SEND FUTURE REQUESTS TO NEW DOCTOR. 30 tablet 0  . meloxicam (MOBIC) 7.5 MG tablet Take 1  tablet (7.5 mg total) by mouth daily. 30 tablet 0  . mometasone (ELOCON) 0.1 % ointment     . montelukast (SINGULAIR) 10 MG tablet Take 1 tablet (10 mg total) by mouth at bedtime. 30 tablet 5  . Multiple Vitamins-Minerals (CENTRUM SILVER PO) Take 1 tablet by mouth every morning.    Marland Kitchen omeprazole (PRILOSEC) 40 MG capsule TAKE 1 CAPSULE BY MOUTH EVERY DAY 90 capsule 1  . Potassium Chloride ER 20 MEQ TBCR TAKE 1 TABLET BY MOUTH EVERY DAY 90 tablet 1  .  Respiratory Therapy Supplies (FLUTTER) DEVI Use as directed 1 each 0  . tiZANidine (ZANAFLEX) 2 MG tablet Take 0.5-2 tablets (1-4 mg total) by mouth 2 (two) times daily as needed for muscle spasms. 40 tablet 1  . valACYclovir (VALTREX) 500 MG tablet Take 500 mg by mouth 2 (two) times daily.  0  . Vitamin D, Ergocalciferol, (DRISDOL) 1.25 MG (50000 UT) CAPS capsule TAKE 1 CAPSULE (50,000 UNITS TOTAL) BY MOUTH EVERY 7 (SEVEN) DAYS. 12 capsule 0   No current facility-administered medications on file prior to visit.    Allergies  Allergen Reactions  . Metformin And Related Diarrhea  . Penicillins Rash    Recent Results (from the past 2160 hour(s))  VITAMIN D 25 Hydroxy (Vit-D Deficiency, Fractures)     Status: None   Collection Time: 06/21/19 12:14 PM  Result Value Ref Range   VITD 85.14 30.00 - 100.00 ng/mL  Uric acid     Status: None   Collection Time: 06/21/19 12:14 PM  Result Value Ref Range   Uric Acid, Serum 4.5 2.4 - 7.0 mg/dL  TSH     Status: None   Collection Time: 06/21/19 12:14 PM  Result Value Ref Range   TSH 2.07 0.35 - 4.50 uIU/mL  Sedimentation rate     Status: Abnormal   Collection Time: 06/21/19 12:14 PM  Result Value Ref Range   Sed Rate 36 (H) 0 - 30 mm/hr  Rheumatoid factor     Status: None   Collection Time: 06/21/19 12:14 PM  Result Value Ref Range   Rhuematoid fact SerPl-aCnc <14 <14 IU/mL  PTH, intact and calcium     Status: None   Collection Time: 06/21/19 12:14 PM  Result Value Ref Range   PTH 34 14 - 64 pg/mL    Comment: . Interpretive Guide    Intact PTH           Calcium ------------------    ----------           ------- Normal Parathyroid    Normal               Normal Hypoparathyroidism    Low or Low Normal    Low Hyperparathyroidism    Primary            Normal or High       High    Secondary          High                 Normal or Low    Tertiary           High                 High Non-Parathyroid    Hypercalcemia      Low or Low Normal     High .    Calcium 9.4 8.6 - 10.4 mg/dL  IBC panel     Status: Abnormal  Collection Time: 06/21/19 12:14 PM  Result Value Ref Range   Iron 56 42 - 145 ug/dL   Transferrin 263.0 212.0 - 360.0 mg/dL   Saturation Ratios 15.2 (L) 0000000 - Q000111Q %  Cyclic citrul peptide antibody, IgG     Status: None   Collection Time: 06/21/19 12:14 PM  Result Value Ref Range   Cyclic Citrullin Peptide Ab <16 UNITS    Comment: Reference Range Negative:            <20 Weak Positive:       20-39 Moderate Positive:   40-59 Strong Positive:     >59 .   Ferritin     Status: None   Collection Time: 06/21/19 12:14 PM  Result Value Ref Range   Ferritin 102.6 10.0 - 291.0 ng/mL  C-reactive protein     Status: None   Collection Time: 06/21/19 12:14 PM  Result Value Ref Range   CRP 3.8 0.5 - 20.0 mg/dL  Comprehensive metabolic panel     Status: Abnormal   Collection Time: 06/21/19 12:14 PM  Result Value Ref Range   Sodium 141 135 - 145 mEq/L   Potassium 3.7 3.5 - 5.1 mEq/L   Chloride 105 96 - 112 mEq/L   CO2 29 19 - 32 mEq/L   Glucose, Bld 119 (H) 70 - 99 mg/dL   BUN 15 6 - 23 mg/dL   Creatinine, Ser 0.82 0.40 - 1.20 mg/dL   Total Bilirubin 0.3 0.2 - 1.2 mg/dL   Alkaline Phosphatase 57 39 - 117 U/L   AST 15 0 - 37 U/L   ALT 25 0 - 35 U/L   Total Protein 6.8 6.0 - 8.3 g/dL   Albumin 3.8 3.5 - 5.2 g/dL   Calcium 9.2 8.4 - 10.5 mg/dL   GFR 82.40 >60.00 mL/min  CBC with Differential/Platelet     Status: Abnormal   Collection Time: 06/21/19 12:14 PM  Result Value Ref Range   WBC 8.3 4.0 - 10.5 K/uL   RBC 3.82 (L) 3.87 - 5.11 Mil/uL   Hemoglobin 11.4 (L) 12.0 - 15.0 g/dL   HCT 34.6 (L) 36.0 - 46.0 %   MCV 90.7 78.0 - 100.0 fl   MCHC 32.9 30.0 - 36.0 g/dL   RDW 14.6 11.5 - 15.5 %   Platelets 187.0 150.0 - 400.0 K/uL   Neutrophils Relative % 71.5 43.0 - 77.0 %   Lymphocytes Relative 18.7 12.0 - 46.0 %   Monocytes Relative 7.7 3.0 - 12.0 %   Eosinophils Relative 1.6 0.0 - 5.0 %   Basophils Relative  0.5 0.0 - 3.0 %   Neutro Abs 5.9 1.4 - 7.7 K/uL   Lymphs Abs 1.5 0.7 - 4.0 K/uL   Monocytes Absolute 0.6 0.1 - 1.0 K/uL   Eosinophils Absolute 0.1 0.0 - 0.7 K/uL   Basophils Absolute 0.0 0.0 - 0.1 K/uL  Calcium, ionized     Status: None   Collection Time: 06/21/19 12:14 PM  Result Value Ref Range   Calcium, Ion 5.33 4.8 - 5.6 mg/dL  ANA     Status: None   Collection Time: 06/21/19 12:14 PM  Result Value Ref Range   Anti Nuclear Antibody (ANA) NEGATIVE NEGATIVE    Comment: ANA IFA is a first line screen for detecting the presence of up to approximately 150 autoantibodies in various autoimmune diseases. A negative ANA IFA result suggests an ANA-associated autoimmune disease is not present at this time, but is not definitive. If there is  high clinical suspicion for Sjogren's syndrome, testing for anti-SS-A/Ro antibody should be considered. Anti-Jo-1 antibody should be considered for clinically suspected inflammatory myopathies. . AC-0: Negative . International Consensus on ANA Patterns (https://www.hernandez-brewer.com/) . For additional information, please refer to http://education.[facility].com/faq/FAQ177 (This link is being provided for informational/ educational purposes only.) .   Angiotensin converting enzyme     Status: None   Collection Time: 06/21/19 12:14 PM  Result Value Ref Range   Angiotensin-Converting Enzyme 24 9 - 67 U/L  Hemoglobin A1c     Status: Abnormal   Collection Time: 07/20/19  9:38 AM  Result Value Ref Range   Hgb A1c MFr Bld 6.6 (H) 4.6 - 6.5 %    Comment: Glycemic Control Guidelines for People with Diabetes:Non Diabetic:  <6%Goal of Therapy: <7%Additional Action Suggested:  >8%   Comprehensive metabolic panel     Status: Abnormal   Collection Time: 07/20/19  9:38 AM  Result Value Ref Range   Sodium 141 135 - 145 mEq/L   Potassium 3.7 3.5 - 5.1 mEq/L   Chloride 104 96 - 112 mEq/L   CO2 29 19 - 32 mEq/L   Glucose, Bld 133 (H) 70 -  99 mg/dL   BUN 12 6 - 23 mg/dL   Creatinine, Ser 0.78 0.40 - 1.20 mg/dL   Total Bilirubin 0.4 0.2 - 1.2 mg/dL   Alkaline Phosphatase 65 39 - 117 U/L   AST 17 0 - 37 U/L   ALT 16 0 - 35 U/L   Total Protein 6.6 6.0 - 8.3 g/dL   Albumin 3.8 3.5 - 5.2 g/dL   Calcium 9.5 8.4 - 10.5 mg/dL   GFR 87.27 >60.00 mL/min  Lipid panel     Status: Abnormal   Collection Time: 07/20/19  9:38 AM  Result Value Ref Range   Cholesterol 168 0 - 200 mg/dL    Comment: ATP III Classification       Desirable:  < 200 mg/dL               Borderline High:  200 - 239 mg/dL          High:  > = 240 mg/dL   Triglycerides 128.0 0.0 - 149.0 mg/dL    Comment: Normal:  <150 mg/dLBorderline High:  150 - 199 mg/dL   HDL 37.60 (L) >39.00 mg/dL   VLDL 25.6 0.0 - 40.0 mg/dL   LDL Cholesterol 105 (H) 0 - 99 mg/dL   Total CHOL/HDL Ratio 4     Comment:                Men          Women1/2 Average Risk     3.4          3.3Average Risk          5.0          4.42X Average Risk          9.6          7.13X Average Risk          15.0          11.0                       NonHDL 130.83     Comment: NOTE:  Non-HDL goal should be 30 mg/dL higher than patient's LDL goal (i.e. LDL goal of < 70 mg/dL, would have non-HDL goal of < 100 mg/dL)  CBC  Status: Abnormal   Collection Time: 07/20/19  9:38 AM  Result Value Ref Range   WBC 10.7 (H) 4.0 - 10.5 K/uL   RBC 3.91 3.87 - 5.11 Mil/uL   Platelets 235.0 150.0 - 400.0 K/uL   Hemoglobin 11.6 (L) 12.0 - 15.0 g/dL   HCT 35.8 (L) 36.0 - 46.0 %   MCV 91.7 78.0 - 100.0 fl   MCHC 32.4 30.0 - 36.0 g/dL   RDW 14.0 11.5 - 15.5 %    Objective: General: Patient is awake, alert, and oriented x 3 and in no acute distress.  Integument: Skin is warm, dry and supple bilateral. Nails are tender, long, thickened and  dystrophic with subungual debris, consistent with onychomycosis, 1-5 bilateral. No signs of infection. No open lesions or preulcerative lesions present bilateral. Remaining integument  unremarkable.  Vasculature:  Dorsalis Pedis pulse 2/4 bilateral. Posterior Tibial pulse  1/4 bilateral.  Capillary fill time <3 sec 1-5 bilateral. Positive hair growth to the level of the digits. Temperature gradient within normal limits. No varicosities present bilateral. No edema present bilateral.   Neurology: The patient has intact sensation measured with a 5.07/10g Semmes Weinstein Monofilament at all pedal sites bilateral . Vibratory sensation diminished bilateral with tuning fork. No Babinski sign present bilateral.   Musculoskeletal: Asymptomatic pes planus pedal deformities noted bilateral. Muscular strength 5/5 in all lower extremity muscular groups bilateral without pain on range of motion . No tenderness with calf compression bilateral.  Assessment and Plan: Problem List Items Addressed This Visit    None    Visit Diagnoses    Pain due to onychomycosis of toenails of both feet    -  Primary   Relevant Medications   ketoconazole (NIZORAL) 2 % cream   Diabetes mellitus without complication ([place])       Encounter for comprehensive diabetic foot examination, type 2 diabetes mellitus ([place])           -Examined patient. -Discussed and educated patient on diabetic foot care, especially with  regards to the vascular, neurological and musculoskeletal systems.  -Stressed the importance of good glycemic control and the detriment of not  controlling glucose levels in relation to the foot. -Mechanically debrided all nails 1-5 bilateral using sterile nail nipper and filed with dremel without incident  -Answered all patient questions -Patient to return yearly for Diabetic foot exam -Patient advised to call the office if any problems or questions arise in the meantime.  Landis Martins, DPM

## 2019-08-13 ENCOUNTER — Telehealth: Payer: Self-pay | Admitting: *Deleted

## 2019-08-13 NOTE — Telephone Encounter (Signed)
Patient will need to give it the weekend. We have done this before and she seems to not follow through with the message

## 2019-08-13 NOTE — Telephone Encounter (Signed)
Pt left msg stating that she's still having back pain & it is now going down her right leg. Pt would like a call back with suggestions from Dr. Tamala Julian as to what she should do.

## 2019-08-14 ENCOUNTER — Other Ambulatory Visit: Payer: Self-pay | Admitting: Family Medicine

## 2019-08-16 ENCOUNTER — Telehealth: Payer: Self-pay

## 2019-08-16 NOTE — Telephone Encounter (Signed)
Copied from [place] [phone]. Topic: General - Other >> Aug 16, 2019  2:40 PM Mcneil, Ja-Kwan wrote: Reason for CRM: Pt stated she provided a stool sample but has not heard back regarding the results. Pt requests results of stool sample. Cb# [phone]

## 2019-08-16 NOTE — Telephone Encounter (Signed)
Patient is still in pain. Wants to know if there is anything that will help before she makes an appointment.

## 2019-08-16 NOTE — Telephone Encounter (Signed)
Results have not came in as of yet

## 2019-08-18 ENCOUNTER — Other Ambulatory Visit: Payer: Self-pay | Admitting: Physical Therapy

## 2019-08-18 DIAGNOSIS — M5416 Radiculopathy, lumbar region: Secondary | ICD-10-CM

## 2019-08-18 NOTE — Telephone Encounter (Signed)
I would try a straight epidurla at L3/4 and then if not better would need to consider referral to neurosurgery

## 2019-08-18 NOTE — Telephone Encounter (Signed)
Epidural order placed.  Called pt and LM on pt's voicemail w/ those details.

## 2019-08-19 ENCOUNTER — Ambulatory Visit
Admission: RE | Admit: 2019-08-19 | Discharge: 2019-08-19 | Disposition: A | Discharge status: Home / Self Care | Payer: Medicare Other | Source: Ambulatory Visit | Attending: Family Medicine | Admitting: Family Medicine

## 2019-08-19 DIAGNOSIS — M545 Low back pain: Secondary | ICD-10-CM | POA: Diagnosis not present

## 2019-08-19 DIAGNOSIS — M5416 Radiculopathy, lumbar region: Secondary | ICD-10-CM

## 2019-08-19 MED ORDER — IOPAMIDOL (ISOVUE-M 200) INJECTION 41%
1.0000 mL | Freq: Once | INTRAMUSCULAR | Status: AC
  Administered 2019-08-19: 1 mL via EPIDURAL

## 2019-08-19 MED ORDER — METHYLPREDNISOLONE ACETATE 40 MG/ML INJ SUSP (RADIOLOG
120.0000 mg | Freq: Once | INTRAMUSCULAR | Status: AC
  Administered 2019-08-19: 120 mg via EPIDURAL

## 2019-08-19 NOTE — Discharge Instructions (Signed)

## 2019-09-02 NOTE — Telephone Encounter (Signed)
Patient calling back about results. She is requesting call back.

## 2019-09-03 ENCOUNTER — Other Ambulatory Visit: Payer: Self-pay | Admitting: Family Medicine

## 2019-09-03 NOTE — Telephone Encounter (Signed)
Results have not been resulted as of yet

## 2019-09-06 NOTE — Progress Notes (Signed)
Virtual Visit via Video Note  I connected with patient on 09/07/19 at 11:00 AM EST by audio enabled telemedicine application and verified that I am speaking with the correct person using two identifiers.   THIS ENCOUNTER IS A VIRTUAL VISIT DUE TO COVID-19 - PATIENT WAS NOT SEEN IN THE OFFICE. PATIENT HAS CONSENTED TO VIRTUAL VISIT / TELEMEDICINE VISIT   Location of patient: home  Location of provider: office  I discussed the limitations of evaluation and management by telemedicine and the availability of in person appointments. The patient expressed understanding and agreed to proceed.   Subjective:   Heidi Franklin is a 73 y.o. female who presents for Medicare Annual (Subsequent) preventive examination.  Review of Systems:  Home Safety/Smoke Alarms: Feels safe in home. Smoke alarms in place.  Lives w/ husband in 1 story home. Sister-in-law recently moved in. Reports a good support system.    Female:        Mammo-12/22/18       Dexa scan- declines       CCS- 01/11/14. Normal. Recall 10 yrs.    Objective:     Vitals: Unable to assess. This visit is enabled though telemedicine due to Covid 19.   Advanced Directives 09/07/2019 01/20/2018 01/14/2017 09/09/2016 08/07/2016 07/11/2016 12/15/2015  Does Patient Have a Medical Advance Directive? No No No No No No No  Does patient want to make changes to medical advance directive? - Yes (ED - Information included in AVS) Yes (ED - Information included in AVS) - - - -  Would patient like information on creating a medical advance directive? No - Patient declined - - No - patient declined information Yes Higher education careers adviser given - -    Tobacco Social History   Tobacco Use  Smoking Status Never Smoker  Smokeless Tobacco Never Used     Counseling given: Not Answered   Clinical Intake: Pain : No/denies pain     Past Medical History:  Diagnosis Date  . ALLERGIC RHINITIS   . Allergy   . Anxiety state, unspecified   . Arthritis    . Asthma   . BRONCHITIS, CHRONIC   . Chronic low back pain 10/09/2016  . DEPRESSION   . DIABETES MELLITUS, TYPE II   . GERD   . HYPERLIPIDEMIA   . HYPERTENSION   . Psoriasis    Past Surgical History:  Procedure Laterality Date  . ABDOMINAL HYSTERECTOMY  1980  . TRIGGER FINGER RELEASE     Index finger   Family History  Problem Relation Age of Onset  . Stroke Mother   . Angina Mother   . Hyperlipidemia Mother   . Hypertension Mother   . Heart disease Mother   . Depression Mother   . Diabetes Father   . Hyperlipidemia Other        Parent  . Hypertension Other        Parent  . Diabetes Sister        x1  . CAD Sister   . COPD Sister        smoker  . Hyperlipidemia Sister   . Hypertension Sister   . Diabetes Brother        x 2  . Hyperlipidemia Brother   . Hypertension Brother   . Diabetes Brother   . Hyperlipidemia Brother   . Hypertension Brother   . CAD Sister   . Arthritis Sister   . Hyperlipidemia Sister   . Hypertension Sister   . Colon cancer Neg Hx   .  Esophageal cancer Neg Hx   . Rectal cancer Neg Hx   . Stomach cancer Neg Hx   . Pancreatic cancer Neg Hx    Social History   Socioeconomic History  . Marital status: Married    Spouse name: Not on file  . Number of children: 0  . Years of education: 6yr colge  . Highest education level: Not on file  Occupational History  . Occupation: Retired   Scientific laboratory technician  . Financial resource strain: Somewhat hard  . Food insecurity    Worry: Sometimes true    Inability: Sometimes true  . Transportation needs    Medical: No    Non-medical: No  Tobacco Use  . Smoking status: Never Smoker  . Smokeless tobacco: Never Used  Substance and Sexual Activity  . Alcohol use: No    Alcohol/week: 0.0 standard drinks  . Drug use: No  . Sexual activity: Not Currently  Lifestyle  . Physical activity    Days per week: 0 days    Minutes per session: 0 min  . Stress: Very much  Relationships  . Social connections     Talks on phone: More than three times a week    Gets together: More than three times a week    Attends religious service: More than 4 times per year    Active member of club or organization: Yes    Attends meetings of clubs or organizations: More than 4 times per year    Relationship status: Married  Other Topics Concern  . Not on file  Social History Narrative   Married, lives with spouse-retired from [employer] insurance   Not employed    Drinks coffee occasional, Consumes 1 soda a day    No dietary restrictions    Outpatient Encounter Medications as of 09/07/2019  Medication Sig  . acetaminophen (TYLENOL) 325 MG tablet Take 650 mg by mouth every 6 (six) hours as needed.  Marland Kitchen albuterol (VENTOLIN HFA) 108 (90 Base) MCG/ACT inhaler Inhale 1-2 puffs into the lungs every 6 (six) hours as needed for wheezing or shortness of breath.  . ALPRAZolam (XANAX) 0.5 MG tablet Take 1 tablet by mouth every 8 hours if needed for anxiety.  Marland Kitchen amLODipine (NORVASC) 10 MG tablet TAKE 1 TABLET BY MOUTH EVERY DAY  . gabapentin (NEURONTIN) 100 MG capsule Take 1 capsule (100 mg total) by mouth 3 (three) times daily.  Marland Kitchen glucose blood (ONE TOUCH ULTRA TEST) test strip 1 each by Other route daily. Use 1 strips to check blood sugar twice a day Ex E11.9  . glyBURIDE (DIABETA) 2.5 MG tablet TAKE 1 TABLET BY MOUTH EVERY DAY WITH BREAKFAST  . hydrochlorothiazide (HYDRODIURIL) 12.5 MG tablet TAKE 1 TABLET BY MOUTH EVERY DAY  . ketoconazole (NIZORAL) 2 % cream 1 APPLICATION APPLY ON THE SKIN TWICE A DAY APPLY 2 WEEKS ON, 1 WEEK OFF. REPEAT AS NEEDED.  Marland Kitchen Lancets (ONETOUCH ULTRASOFT) lancets PRN  . losartan (COZAAR) 100 MG tablet TAKE 1 TABLET BY MOUTH EVERY DAY  . lovastatin (MEVACOR) 20 MG tablet TAKE 1 TABLET BY MOUTH EVERY DAY AT BEDTIME. PLEASE SEND FUTURE REQUESTS TO NEW DOCTOR.  . mometasone (ELOCON) 0.1 % ointment   . montelukast (SINGULAIR) 10 MG tablet Take 1 tablet (10 mg total) by mouth at bedtime.  . Multiple  Vitamins-Minerals (CENTRUM SILVER PO) Take 1 tablet by mouth every morning.  Marland Kitchen omeprazole (PRILOSEC) 40 MG capsule TAKE 1 CAPSULE BY MOUTH EVERY DAY  . Potassium Chloride ER 20  MEQ TBCR TAKE 1 TABLET BY MOUTH EVERY DAY  . Respiratory Therapy Supplies (FLUTTER) DEVI Use as directed  . tiZANidine (ZANAFLEX) 2 MG tablet Take 0.5-2 tablets (1-4 mg total) by mouth 2 (two) times daily as needed for muscle spasms.  . Vitamin D, Ergocalciferol, (DRISDOL) 1.25 MG (50000 UT) CAPS capsule TAKE 1 CAPSULE (50,000 UNITS TOTAL) BY MOUTH EVERY 7 (SEVEN) DAYS.  Marland Kitchen meloxicam (MOBIC) 7.5 MG tablet Take 1 tablet (7.5 mg total) by mouth daily. (Patient not taking: Reported on 09/07/2019)  . valACYclovir (VALTREX) 500 MG tablet Take 500 mg by mouth 2 (two) times daily.   No facility-administered encounter medications on file as of 09/07/2019.     Activities of Daily Living In your present state of health, do you have any difficulty performing the following activities: 09/07/2019  Hearing? N  Vision? N  Difficulty concentrating or making decisions? N  Walking or climbing stairs? N  Dressing or bathing? N  Doing errands, shopping? N  Preparing Food and eating ? N  Using the Toilet? N  In the past six months, have you accidently leaked urine? N  Do you have problems with loss of bowel control? N  Managing your Medications? N  Managing your Finances? N  Housekeeping or managing your Housekeeping? N  Some recent data might be hidden    Patient Care Team: [name], MD as PCP - General (Family Medicine) Chesley Mires, MD (Pulmonary Disease) Tuchman, Leslye Peer, DPM (Podiatry) Almedia Balls, MD (Orthopedic Surgery) Irene Shipper, MD (Gastroenterology) Star Age, MD (Neurology)    Assessment:   This is a routine wellness examination for [place]. Physical assessment deferred to PCP.  Exercise Activities and Dietary recommendations   Diet (meal preparation, eat out, water intake, caffeinated  beverages, dairy products, fruits and vegetables): well balanced, on average, 2 meals per day     Goals    . patient     Will reduce stress; take time off from more stress and seek opportunities to rest.    . Patient Stated     Take at least 30 minutes daily just for me to relax, meditate do what speaks to me.     . Reduce stress     Continue to slow down, use mindfulness to help me realize what is out of my control. Read, play tablet games, do bible lessons to take my mind off of the stress.        Fall Risk Fall Risk  09/07/2019 06/22/2018 01/20/2018 01/14/2017 08/07/2016  Falls in the past year? 0 - No Yes Yes  Number falls in past yr: 0 2 or more - 2 or more 2 or more  Injury with Fall? 0 No - No -  Risk Factor Category  - High Fall Risk - - -  Risk for fall due to : - History of fall(s) - Impaired mobility -  Follow up - Education provided - Falls prevention discussed;Education provided Education provided;Follow up appointment    Depression Screen PHQ 2/9 Scores 09/07/2019 01/20/2018 08/08/2017 01/14/2017  PHQ - 2 Score 0 0 0 1  PHQ- 9 Score - 5 0 -     Cognitive Function Ad8 score reviewed for issues:  Issues making decisions:no  Less interest in hobbies / activities:no  Repeats questions, stories (family complaining):no  Trouble using ordinary gadgets (microwave, computer, phone):no  Forgets the month or year: no  Mismanaging finances: no  Remembering appts:no  Daily problems with thinking and/or memory:no Ad8 score is=0  MMSE - Mini Mental State Exam 01/20/2018 08/07/2016  Not completed: - (No Data)  Orientation to time 5 -  Orientation to Place 5 -  Registration 3 -  Attention/ Calculation 5 -  Recall 2 -  Language- name 2 objects 2 -  Language- repeat 1 -  Language- follow 3 step command 3 -  Language- read & follow direction 1 -  Write a sentence 1 -  Copy design 1 -  Total score 29 -        Immunization History  Administered Date(s)  Administered  . Fluad Quad(high Dose 65+) 06/25/2019  . Influenza Split 08/20/2011, 07/30/2012, 06/28/2014  . Influenza Whole 07/28/2009, 07/06/2010  . Influenza, High Dose Seasonal PF 06/19/2018  . Influenza, Seasonal, Injecte, Preservative Fre 06/25/2019  . Influenza,inj,Quad PF,6+ Mos 06/23/2013, 07/05/2015, 06/20/2016, 06/23/2017  . Influenza-Unspecified 06/28/2014  . Pneumococcal Conjugate-13 06/15/2015  . Pneumococcal Polysaccharide-23 10/02/2011  . Td 11/23/2013    Screening Tests Health Maintenance  Topic Date Due  . OPHTHALMOLOGY EXAM  09/02/2019  . HEMOGLOBIN A1C  01/17/2020  . FOOT EXAM  08/02/2020  . MAMMOGRAM  12/22/2020  . TETANUS/TDAP  11/24/2023  . COLONOSCOPY  01/12/2024  . INFLUENZA VACCINE  Completed  . DEXA SCAN  Completed  . Hepatitis C Screening  Completed  . PNA vac Low Risk Adult  Completed       Plan:    Please schedule your next medicare wellness visit with me in 1 yr.  Continue to eat heart healthy diet (full of fruits, vegetables, whole grains, lean protein, water--limit salt, fat, and sugar intake) and increase physical activity as tolerated.  Continue doing brain stimulating activities (puzzles, reading, adult coloring books, staying active) to keep memory sharp.   Bring a copy of your living will and/or healthcare power of attorney to your next office visit.    I have personally reviewed and noted the following in the patient's chart:   . Medical and social history . Use of alcohol, tobacco or illicit drugs  . Current medications and supplements . Functional ability and status . Nutritional status . Physical activity . Advanced directives . List of other physicians . Hospitalizations, surgeries, and ER visits in previous 12 months . Vitals . Screenings to include cognitive, depression, and falls . Referrals and appointments  In addition, I have reviewed and discussed with patient certain preventive protocols, quality metrics, and  best practice recommendations. A written personalized care plan for preventive services as well as general preventive health recommendations were provided to patient.     Shela Nevin, [place]  09/07/2019

## 2019-09-07 ENCOUNTER — Other Ambulatory Visit: Payer: Self-pay

## 2019-09-07 ENCOUNTER — Ambulatory Visit (INDEPENDENT_AMBULATORY_CARE_PROVIDER_SITE_OTHER): Payer: Medicare Other | Admitting: *Deleted

## 2019-09-07 ENCOUNTER — Encounter: Payer: Self-pay | Admitting: *Deleted

## 2019-09-07 DIAGNOSIS — L304 Erythema intertrigo: Secondary | ICD-10-CM | POA: Diagnosis not present

## 2019-09-07 DIAGNOSIS — Z0000 Encounter for general adult medical examination without abnormal findings: Secondary | ICD-10-CM

## 2019-09-07 NOTE — Patient Instructions (Signed)
Please schedule your next medicare wellness visit with me in 1 yr.  Continue to eat heart healthy diet (full of fruits, vegetables, whole grains, lean protein, water--limit salt, fat, and sugar intake) and increase physical activity as tolerated.  Continue doing brain stimulating activities (puzzles, reading, adult coloring books, staying active) to keep memory sharp.   Bring a copy of your living will and/or healthcare power of attorney to your next office visit.   Heidi Franklin , Thank you for taking time to come for your Medicare Wellness Visit. I appreciate your ongoing commitment to your health goals. Please review the following plan we discussed and let me know if I can assist you in the future.   These are the goals we discussed: Goals    . patient     Will reduce stress; take time off from more stress and seek opportunities to rest.       This is a list of the screening recommended for you and due dates:  Health Maintenance  Topic Date Due  . Eye exam for diabetics  09/02/2019  . Hemoglobin A1C  01/17/2020  . Complete foot exam   08/02/2020  . Mammogram  12/22/2020  . Tetanus Vaccine  11/24/2023  . Colon Cancer Screening  01/12/2024  . Flu Shot  Completed  . DEXA scan (bone density measurement)  Completed  .  Hepatitis C: One time screening is recommended by Center for Disease Control  (CDC) for  adults born from [birth year] through 1966.   Completed  . Pneumonia vaccines  Completed    Preventive Care 17 Years and Older, Female Preventive care refers to lifestyle choices and visits with your health care provider that can promote health and wellness. This includes:  A yearly physical exam. This is also called an annual well check.  Regular dental and eye exams.  Immunizations.  Screening for certain conditions.  Healthy lifestyle choices, such as diet and exercise. What can I expect for my preventive care visit? Physical exam Your health care provider will check:  Height  and weight. These may be used to calculate body mass index (BMI), which is a measurement that tells if you are at a healthy weight.  Heart rate and blood pressure.  Your skin for abnormal spots. Counseling Your health care provider may ask you questions about:  Alcohol, tobacco, and drug use.  Emotional well-being.  Home and relationship well-being.  Sexual activity.  Eating habits.  History of falls.  Memory and ability to understand (cognition).  Work and work Statistician.  Pregnancy and menstrual history. What immunizations do I need?  Influenza (flu) vaccine  This is recommended every year. Tetanus, diphtheria, and pertussis (Tdap) vaccine  You may need a Td booster every 10 years. Varicella (chickenpox) vaccine  You may need this vaccine if you have not already been vaccinated. Zoster (shingles) vaccine  You may need this after age 104. Pneumococcal conjugate (PCV13) vaccine  One dose is recommended after age 79. Pneumococcal polysaccharide (PPSV23) vaccine  One dose is recommended after age 72. Measles, mumps, and rubella (MMR) vaccine  You may need at least one dose of MMR if you were born in 1957 or later. You may also need a second dose. Meningococcal conjugate (MenACWY) vaccine  You may need this if you have certain conditions. Hepatitis A vaccine  You may need this if you have certain conditions or if you travel or work in places where you may be exposed to hepatitis A. Hepatitis B vaccine  You may need this if you have certain conditions or if you travel or work in places where you may be exposed to hepatitis B. Haemophilus influenzae type b (Hib) vaccine  You may need this if you have certain conditions. You may receive vaccines as individual doses or as more than one vaccine together in one shot (combination vaccines). Talk with your health care provider about the risks and benefits of combination vaccines. What tests do I need? Blood tests   Lipid and cholesterol levels. These may be checked every 5 years, or more frequently depending on your overall health.  Hepatitis C test.  Hepatitis B test. Screening  Lung cancer screening. You may have this screening every year starting at age 34 if you have a 30-pack-year history of smoking and currently smoke or have quit within the past 15 years.  Colorectal cancer screening. All adults should have this screening starting at age 102 and continuing until age 59. Your health care provider may recommend screening at age 26 if you are at increased risk. You will have tests every 1-10 years, depending on your results and the type of screening test.  Diabetes screening. This is done by checking your blood sugar (glucose) after you have not eaten for a while (fasting). You may have this done every 1-3 years.  Mammogram. This may be done every 1-2 years. Talk with your health care provider about how often you should have regular mammograms.  BRCA-related cancer screening. This may be done if you have a family history of breast, ovarian, tubal, or peritoneal cancers. Other tests  Sexually transmitted disease (STD) testing.  Bone density scan. This is done to screen for osteoporosis. You may have this done starting at age 12. Follow these instructions at home: Eating and drinking  Eat a diet that includes fresh fruits and vegetables, whole grains, lean protein, and low-fat dairy products. Limit your intake of foods with high amounts of sugar, saturated fats, and salt.  Take vitamin and mineral supplements as recommended by your health care provider.  Do not drink alcohol if your health care provider tells you not to drink.  If you drink alcohol: ? Limit how much you have to 0-1 drink a day. ? Be aware of how much alcohol is in your drink. In the [place] one drink equals one 12 oz bottle of beer (355 mL), one 5 oz glass of wine (148 mL), or one 1 oz glass of hard liquor (44 mL). Lifestyle   Take daily care of your teeth and gums.  Stay active. Exercise for at least 30 minutes on 5 or more days each week.  Do not use any products that contain nicotine or tobacco, such as cigarettes, e-cigarettes, and chewing tobacco. If you need help quitting, ask your health care provider.  If you are sexually active, practice safe sex. Use a condom or other form of protection in order to prevent STIs (sexually transmitted infections).  Talk with your health care provider about taking a low-dose aspirin or statin. What's next?  Go to your health care provider once a year for a well check visit.  Ask your health care provider how often you should have your eyes and teeth checked.  Stay up to date on all vaccines. This information is not intended to replace advice given to you by your health care provider. Make sure you discuss any questions you have with your health care provider. Document Released: 11/10/2015 Document Revised: 10/08/2018 Document Reviewed: 10/08/2018 Elsevier Patient  Education  2020 Elsevier Inc.  

## 2019-09-13 ENCOUNTER — Other Ambulatory Visit: Payer: Self-pay | Admitting: Internal Medicine

## 2019-09-14 ENCOUNTER — Other Ambulatory Visit: Payer: Self-pay | Admitting: Family Medicine

## 2019-09-14 ENCOUNTER — Encounter: Payer: Self-pay | Admitting: Family Medicine

## 2019-09-14 MED ORDER — ALPRAZOLAM 0.5 MG PO TABS: ORAL_TABLET | ORAL | 2 refills | Status: DC

## 2019-09-14 MED ORDER — LOSARTAN POTASSIUM 100 MG PO TABS: 100.0000 mg | ORAL_TABLET | Freq: Every day | ORAL | 1 refills | Status: DC

## 2019-09-14 NOTE — Telephone Encounter (Signed)
Requesting:xanax Contract:yes UDS:n/a Last OV:07/20/19 Next OV:10/19/19 Last Refill:11/24/18  #[id number] Database:   Please advise

## 2019-09-14 NOTE — Telephone Encounter (Signed)
ALPRAZolam (XANAX) 0.5 MG tablet losartan (COZAAR) 100 MG tablet    [id number] - [place], [place] - [address] [address]  Phone: [phone] Fax: [phone]

## 2019-09-14 NOTE — Telephone Encounter (Signed)
Requested medication (s) are due for refill today: yes  Requested medication (s) are on the active medication list: yes  Last refill:  11/24/2018  Future visit scheduled: yes  Notes to clinic: last filled by different provider    Requested Prescriptions  Pending Prescriptions Disp Refills   ALPRAZolam (XANAX) 0.5 MG tablet 30 tablet 0    Sig: Take 1 tablet by mouth every 8 hours if needed for anxiety.     Not Delegated - Psychiatry:  Anxiolytics/Hypnotics Failed - 09/14/2019  9:54 AM      Failed - This refill cannot be delegated      Failed - Urine Drug Screen completed in last 360 days.      Passed - Valid encounter within last 6 months    Recent Outpatient Visits          1 month ago Type 2 diabetes, uncontrolled, with neuropathy ([place])   [facility] at [place], MD   2 months ago Lumbar radiculopathy   [place], [place], DO   4 months ago Type 2 diabetes, uncontrolled, with neuropathy [facility])   [facility] [name], MD   5 months ago Lumbar radiculopathy   [employer] [place], [place], DO   5 months ago Vitamin D deficiency   Archivist at [place], MD      Future Appointments            In 1 month [name], MD [place] at [employer] [facility]            losartan (COZAAR) 100 MG tablet 90 tablet 1    Sig: Take 1 tablet (100 mg total) by mouth daily.     Cardiovascular:  Angiotensin Receptor Blockers Passed - 09/14/2019  9:54 AM      Passed - Cr in normal range and within 180 days    Creatinine, Ser  Date Value Ref Range Status  07/20/2019 0.78 0.40 - 1.20 mg/dL Final         Passed - K in normal range and within 180 days    Potassium  Date Value Ref Range Status  07/20/2019 3.7 3.5 - 5.1 mEq/L Final         Passed -  Patient is not pregnant      Passed - Last BP in normal range    BP Readings from Last 1 Encounters:  08/19/19 137/61         Passed - Valid encounter within last 6 months    Recent Outpatient Visits          1 month ago Type 2 diabetes, uncontrolled, with neuropathy ([place])   [facility] at [place], MD   2 months ago Lumbar radiculopathy   [place], Olevia [place], DO   4 months ago Type 2 diabetes, uncontrolled, with neuropathy [facility])   Archivist at [place], MD   5 months ago Lumbar radiculopathy   [place], [place], DO   5 months ago Vitamin D deficiency   Archivist at [place], MD      Future Appointments  In 1 month [name], MD [employer] at [place]

## 2019-09-15 ENCOUNTER — Telehealth: Payer: Self-pay

## 2019-09-15 ENCOUNTER — Other Ambulatory Visit: Payer: Self-pay

## 2019-09-15 DIAGNOSIS — M5416 Radiculopathy, lumbar region: Secondary | ICD-10-CM

## 2019-09-15 NOTE — Telephone Encounter (Signed)
Patient notified and she will let Dr. Tamala Julian know so he can place referral.

## 2019-09-15 NOTE — Telephone Encounter (Signed)
I would probably recommend a neurosurgeon, maybe Dr Vertell Limber I can place referrak

## 2019-09-15 NOTE — Telephone Encounter (Signed)
Copied from [place] [phone]. Topic: General - Inquiry >> Sep 15, 2019  8:58 AM Virl Axe D wrote: Reason for CRM: Pt stated she has been seeing Dr. Charlann Boxer at [facility] regarding her back pain. She has been getting injections but he has advised that she see a Psychologist, sport and exercise. She would like to know if Dr. Charlett Blake thinks she should get a second opinion first. Also she wants to know if Dr. Charlett Blake knows any surgeons. Please advise. Requesting callback.

## 2019-09-16 ENCOUNTER — Other Ambulatory Visit: Payer: Self-pay | Admitting: Family Medicine

## 2019-09-16 DIAGNOSIS — H5212 Myopia, left eye: Secondary | ICD-10-CM | POA: Diagnosis not present

## 2019-09-16 DIAGNOSIS — H52221 Regular astigmatism, right eye: Secondary | ICD-10-CM | POA: Diagnosis not present

## 2019-09-16 DIAGNOSIS — E119 Type 2 diabetes mellitus without complications: Secondary | ICD-10-CM | POA: Diagnosis not present

## 2019-09-16 DIAGNOSIS — H43813 Vitreous degeneration, bilateral: Secondary | ICD-10-CM | POA: Diagnosis not present

## 2019-09-16 DIAGNOSIS — H524 Presbyopia: Secondary | ICD-10-CM | POA: Diagnosis not present

## 2019-09-16 DIAGNOSIS — H2513 Age-related nuclear cataract, bilateral: Secondary | ICD-10-CM | POA: Diagnosis not present

## 2019-09-16 DIAGNOSIS — H04123 Dry eye syndrome of bilateral lacrimal glands: Secondary | ICD-10-CM | POA: Diagnosis not present

## 2019-09-22 ENCOUNTER — Other Ambulatory Visit: Payer: Self-pay

## 2019-10-07 DIAGNOSIS — L400 Psoriasis vulgaris: Secondary | ICD-10-CM | POA: Diagnosis not present

## 2019-10-14 ENCOUNTER — Other Ambulatory Visit: Payer: Self-pay | Admitting: Family Medicine

## 2019-10-15 ENCOUNTER — Other Ambulatory Visit: Payer: Self-pay | Admitting: Family Medicine

## 2019-10-15 NOTE — Telephone Encounter (Signed)
Last OV 07/20/19 Last refill 04/05/19 #30/5 Next OV 10/19/19

## 2019-10-19 ENCOUNTER — Ambulatory Visit (INDEPENDENT_AMBULATORY_CARE_PROVIDER_SITE_OTHER): Payer: Medicare Other | Admitting: Family Medicine

## 2019-10-19 ENCOUNTER — Other Ambulatory Visit: Payer: Self-pay

## 2019-10-19 ENCOUNTER — Telehealth: Payer: Self-pay | Admitting: *Deleted

## 2019-10-19 DIAGNOSIS — E1165 Type 2 diabetes mellitus with hyperglycemia: Secondary | ICD-10-CM | POA: Diagnosis not present

## 2019-10-19 DIAGNOSIS — E1169 Type 2 diabetes mellitus with other specified complication: Secondary | ICD-10-CM | POA: Diagnosis not present

## 2019-10-19 DIAGNOSIS — I10 Essential (primary) hypertension: Secondary | ICD-10-CM | POA: Diagnosis not present

## 2019-10-19 DIAGNOSIS — D649 Anemia, unspecified: Secondary | ICD-10-CM

## 2019-10-19 DIAGNOSIS — E1140 Type 2 diabetes mellitus with diabetic neuropathy, unspecified: Secondary | ICD-10-CM

## 2019-10-19 DIAGNOSIS — E785 Hyperlipidemia, unspecified: Secondary | ICD-10-CM

## 2019-10-19 DIAGNOSIS — E559 Vitamin D deficiency, unspecified: Secondary | ICD-10-CM | POA: Diagnosis not present

## 2019-10-19 DIAGNOSIS — IMO0002 Reserved for concepts with insufficient information to code with codable children: Secondary | ICD-10-CM

## 2019-10-19 DIAGNOSIS — F418 Other specified anxiety disorders: Secondary | ICD-10-CM

## 2019-10-19 MED ORDER — GLYBURIDE 2.5 MG PO TABS: 2.5000 mg | ORAL_TABLET | Freq: Two times a day (BID) | ORAL | 1 refills | Status: DC

## 2019-10-19 NOTE — Telephone Encounter (Signed)
Lab in 2 weeks, orders in, please put in note for lab to give her a ifob please.  Follow up 3-4 months.

## 2019-10-20 NOTE — Assessment & Plan Note (Signed)
Supplement and monitor 

## 2019-10-20 NOTE — Progress Notes (Signed)
Virtual Visit via Video Note  I connected with [name] on 10/19/19 at 10:00 AM EST by a video enabled telemedicine application and verified that I am speaking with the correct person using two identifiers.  Location: Patient: home Provider: home   I discussed the limitations of evaluation and management by telemedicine and the availability of in person appointments. The patient expressed understanding and agreed to proceed. Magdalene Molly, CMA was able to get the patient set up on a visit,    Subjective:    Patient ID: Heidi Franklin, female    DOB: 1946/06/13, 73 y.o.   MRN: [id number]  Chief Complaint  Patient presents with  . Diabetes  . Hyperlipidemia  . Hypertension    HPI Patient is in today for follow up on chronic medical concerns including psoriasis, hypertension, diabetes and more. No recent febrile illness or hospitalizations. She has recently been started on prednisone for a flare in her psoriasis and her sugars have spiked up to 180s. She also notes she had a sore throat last week but that is better. She struggles with constant congestion but that is stable. She is maintaining quarantine well. Denies CP/palp/SOB/HA/congestion/fevers/GI or GU c/o. Taking meds as prescribed  Past Medical History:  Diagnosis Date  . ALLERGIC RHINITIS   . Allergy   . Anxiety state, unspecified   . Arthritis   . Asthma   . BRONCHITIS, CHRONIC   . Chronic low back pain 10/09/2016  . DEPRESSION   . DIABETES MELLITUS, TYPE II   . GERD   . HYPERLIPIDEMIA   . HYPERTENSION   . Psoriasis     Past Surgical History:  Procedure Laterality Date  . ABDOMINAL HYSTERECTOMY  1980  . TRIGGER FINGER RELEASE     Index finger    Family History  Problem Relation Age of Onset  . Stroke Mother   . Angina Mother   . Hyperlipidemia Mother   . Hypertension Mother   . Heart disease Mother   . Depression Mother   . Diabetes Father   . Hyperlipidemia Other        Parent  . Hypertension Other         Parent  . Diabetes Sister        x1  . CAD Sister   . COPD Sister        smoker  . Hyperlipidemia Sister   . Hypertension Sister   . Diabetes Brother        x 2  . Hyperlipidemia Brother   . Hypertension Brother   . Diabetes Brother   . Hyperlipidemia Brother   . Hypertension Brother   . CAD Sister   . Arthritis Sister   . Hyperlipidemia Sister   . Hypertension Sister   . Colon cancer Neg Hx   . Esophageal cancer Neg Hx   . Rectal cancer Neg Hx   . Stomach cancer Neg Hx   . Pancreatic cancer Neg Hx     Social History   Socioeconomic History  . Marital status: Married    Spouse name: Not on file  . Number of children: 0  . Years of education: 85yr colge  . Highest education level: Not on file  Occupational History  . Occupation: Retired   Tobacco Use  . Smoking status: Never Smoker  . Smokeless tobacco: Never Used  Substance and Sexual Activity  . Alcohol use: No    Alcohol/week: 0.0 standard drinks  . Drug use: No  . Sexual activity: Not  Currently  Other Topics Concern  . Not on file  Social History Narrative   Married, lives with spouse-retired from [employer] insurance   Not employed    Drinks coffee occasional, Consumes 1 soda a day    No dietary restrictions   Social Determinants of Radio broadcast assistant Strain:   . Difficulty of Paying Living Expenses: Not on file  Food Insecurity:   . Worried About Charity fundraiser in the Last Year: Not on file  . Ran Out of Food in the Last Year: Not on file  Transportation Needs:   . Lack of Transportation (Medical): Not on file  . Lack of Transportation (Non-Medical): Not on file  Physical Activity:   . Days of Exercise per Week: Not on file  . Minutes of Exercise per Session: Not on file  Stress:   . Feeling of Stress : Not on file  Social Connections:   . Frequency of Communication with Friends and Family: Not on file  . Frequency of Social Gatherings with Friends and Family: Not on file  .  Attends Religious Services: Not on file  . Active Member of Clubs or Organizations: Not on file  . Attends Archivist Meetings: Not on file  . Marital Status: Not on file  Intimate Partner Violence:   . Fear of Current or Ex-Partner: Not on file  . Emotionally Abused: Not on file  . Physically Abused: Not on file  . Sexually Abused: Not on file    Outpatient Medications Prior to Visit  Medication Sig Dispense Refill  . acetaminophen (TYLENOL) 325 MG tablet Take 650 mg by mouth every 6 (six) hours as needed.    Marland Kitchen albuterol (VENTOLIN HFA) 108 (90 Base) MCG/ACT inhaler Inhale 1-2 puffs into the lungs every 6 (six) hours as needed for wheezing or shortness of breath. 1 Inhaler 3  . ALPRAZolam (XANAX) 0.5 MG tablet Take 1 tablet by mouth every 8 hours if needed for anxiety. 30 tablet 2  . amLODipine (NORVASC) 10 MG tablet TAKE 1 TABLET BY MOUTH EVERY DAY 90 tablet 1  . gabapentin (NEURONTIN) 100 MG capsule Take 1 capsule (100 mg total) by mouth 3 (three) times daily. 180 capsule 2  . glucose blood (ONE TOUCH ULTRA TEST) test strip 1 each by Other route daily. Use 1 strips to check blood sugar twice a day Ex E11.9 100 each 3  . hydrochlorothiazide (HYDRODIURIL) 12.5 MG tablet TAKE 1 TABLET BY MOUTH EVERY DAY 30 tablet 5  . ketoconazole (NIZORAL) 2 % cream 1 APPLICATION APPLY ON THE SKIN TWICE A DAY APPLY 2 WEEKS ON, 1 WEEK OFF. REPEAT AS NEEDED.    Marland Kitchen Lancets (ONETOUCH ULTRASOFT) lancets PRN 100 each 3  . losartan (COZAAR) 100 MG tablet Take 1 tablet (100 mg total) by mouth daily. 90 tablet 1  . lovastatin (MEVACOR) 20 MG tablet TAKE 1 TABLET BY MOUTH EVERY DAY AT BEDTIME. PLEASE SEND FUTURE REQUESTS TO NEW DOCTOR. 30 tablet 0  . meloxicam (MOBIC) 7.5 MG tablet Take 1 tablet (7.5 mg total) by mouth daily. 30 tablet 0  . mometasone (ELOCON) 0.1 % ointment     . montelukast (SINGULAIR) 10 MG tablet TAKE 1 TABLET BY MOUTH EVERYDAY AT BEDTIME 90 tablet 1  . Multiple Vitamins-Minerals  (CENTRUM SILVER PO) Take 1 tablet by mouth every morning.    Marland Kitchen omeprazole (PRILOSEC) 40 MG capsule TAKE 1 CAPSULE BY MOUTH EVERY DAY 90 capsule 1  . Potassium Chloride ER  20 MEQ TBCR TAKE 1 TABLET BY MOUTH EVERY DAY 90 tablet 1  . Respiratory Therapy Supplies (FLUTTER) DEVI Use as directed 1 each 0  . tiZANidine (ZANAFLEX) 2 MG tablet Take 0.5-2 tablets (1-4 mg total) by mouth 2 (two) times daily as needed for muscle spasms. 40 tablet 1  . valACYclovir (VALTREX) 500 MG tablet Take 500 mg by mouth 2 (two) times daily.  0  . Vitamin D, Ergocalciferol, (DRISDOL) 1.25 MG (50000 UT) CAPS capsule TAKE 1 CAPSULE (50,000 UNITS TOTAL) BY MOUTH EVERY 7 (SEVEN) DAYS. 12 capsule 0  . glyBURIDE (DIABETA) 2.5 MG tablet TAKE 1 TABLET BY MOUTH EVERY DAY WITH BREAKFAST 90 tablet 0   No facility-administered medications prior to visit.    Allergies  Allergen Reactions  . Metformin And Related Diarrhea  . Penicillins Rash    Review of Systems  Constitutional: Negative for fever and malaise/fatigue.  HENT: Positive for congestion.   Eyes: Negative for blurred vision.  Respiratory: Negative for shortness of breath.   Cardiovascular: Negative for chest pain, palpitations and leg swelling.  Gastrointestinal: Negative for abdominal pain, blood in stool and nausea.  Genitourinary: Negative for dysuria and frequency.  Musculoskeletal: Negative for falls.  Skin: Positive for itching and rash.  Neurological: Negative for dizziness, loss of consciousness and headaches.  Endo/Heme/Allergies: Negative for environmental allergies.  Psychiatric/Behavioral: Negative for depression. The patient is not nervous/anxious.        Objective:    Physical Exam Constitutional:      Appearance: Normal appearance. She is obese. She is not ill-appearing.  HENT:     Head: Normocephalic and atraumatic.     Nose: Nose normal.  Eyes:     General:        Right eye: No discharge.        Left eye: No discharge.  Pulmonary:       Effort: Pulmonary effort is normal.  Neurological:     Mental Status: She is alert and oriented to person, place, and time.  Psychiatric:        Mood and Affect: Mood normal.        Behavior: Behavior normal.     There were no vitals taken for this visit. Wt Readings from Last 3 Encounters:  07/20/19 151 lb 9.6 oz (68.8 kg)  07/02/19 153 lb (69.4 kg)  04/13/19 159 lb (72.1 kg)    Diabetic Foot Exam - Simple   No data filed     Lab Results  Component Value Date   WBC 10.7 (H) 07/20/2019   HGB 11.6 (L) 07/20/2019   HCT 35.8 (L) 07/20/2019   PLT 235.0 07/20/2019   GLUCOSE 133 (H) 07/20/2019   CHOL 168 07/20/2019   TRIG 128.0 07/20/2019   HDL 37.60 (L) 07/20/2019   LDLDIRECT 155.1 02/09/2013   LDLCALC 105 (H) 07/20/2019   ALT 16 07/20/2019   AST 17 07/20/2019   NA 141 07/20/2019   K 3.7 07/20/2019   CL 104 07/20/2019   CREATININE 0.78 07/20/2019   BUN 12 07/20/2019   CO2 29 07/20/2019   TSH 2.07 06/21/2019   HGBA1C 6.6 (H) 07/20/2019   MICROALBUR 1.6 08/08/2016    Lab Results  Component Value Date   TSH 2.07 06/21/2019   Lab Results  Component Value Date   WBC 10.7 (H) 07/20/2019   HGB 11.6 (L) 07/20/2019   HCT 35.8 (L) 07/20/2019   MCV 91.7 07/20/2019   PLT 235.0 07/20/2019   Lab Results  Component  Value Date   NA 141 07/20/2019   K 3.7 07/20/2019   CO2 29 07/20/2019   GLUCOSE 133 (H) 07/20/2019   BUN 12 07/20/2019   CREATININE 0.78 07/20/2019   BILITOT 0.4 07/20/2019   ALKPHOS 65 07/20/2019   AST 17 07/20/2019   ALT 16 07/20/2019   PROT 6.6 07/20/2019   ALBUMIN 3.8 07/20/2019   CALCIUM 9.5 07/20/2019   ANIONGAP 7 10/14/2015   GFR 87.27 07/20/2019   Lab Results  Component Value Date   CHOL 168 07/20/2019   Lab Results  Component Value Date   HDL 37.60 (L) 07/20/2019   Lab Results  Component Value Date   LDLCALC 105 (H) 07/20/2019   Lab Results  Component Value Date   TRIG 128.0 07/20/2019   Lab Results  Component Value Date    CHOLHDL 4 07/20/2019   Lab Results  Component Value Date   HGBA1C 6.6 (H) 07/20/2019       Assessment & Plan:   Problem List Items Addressed This Visit    Type 2 diabetes, uncontrolled, with neuropathy ([place]) - Primary    hgba1c acceptable, minimize simple carbs. Increase exercise as tolerated. Continue meds      Relevant Medications   glyBURIDE (DIABETA) 2.5 MG tablet   Other Relevant Orders   A1C   Hyperlipidemia associated with type 2 diabetes mellitus ([place])    Tolerating statin, encouraged heart healthy diet, avoid trans fats, minimize simple carbs and saturated fats. Increase exercise as tolerated      Relevant Medications   glyBURIDE (DIABETA) 2.5 MG tablet   Other Relevant Orders   Lipid panel   Depression with anxiety    She is managing well despite the pandemic.       Essential hypertension    Encouraged to monitor and report any concerns.  no changes to meds. Encouraged heart healthy diet such as the DASH diet and exercise as tolerated.       Relevant Orders   CMP   Anemia    Increase leafy greens, consider increased lean red meat and using cast iron cookware. Continue to monitor, report any concerns      Relevant Orders   CBC   Fecal occult blood, imunochemical   Vitamin D deficiency    Supplement andmonitor      Relevant Orders   VITAMIN D      I have changed Heidi Franklin's glyBURIDE. I am also having her maintain her Multiple Vitamins-Minerals (CENTRUM SILVER PO), Flutter, mometasone, acetaminophen, albuterol, valACYclovir, hydrochlorothiazide, Potassium Chloride ER, onetouch ultrasoft, glucose blood, omeprazole, meloxicam, Vitamin D (Ergocalciferol), amLODipine, tiZANidine, gabapentin, ketoconazole, ALPRAZolam, losartan, lovastatin, and montelukast.  Meds ordered this encounter  Medications  . glyBURIDE (DIABETA) 2.5 MG tablet    Sig: Take 1 tablet (2.5 mg total) by mouth 2 (two) times daily with a meal.    Dispense:  180 tablet    Refill:  1      I discussed the assessment and treatment plan with the patient. The patient was provided an opportunity to ask questions and all were answered. The patient agreed with the plan and demonstrated an understanding of the instructions.   The patient was advised to call back or seek an in-person evaluation if the symptoms worsen or if the condition fails to improve as anticipated.  I provided 25 minutes of non-face-to-face time during this encounter.   Penni Homans, MD

## 2019-10-20 NOTE — Assessment & Plan Note (Signed)
Increase leafy greens, consider increased lean red meat and using cast iron cookware. Continue to monitor, report any concerns 

## 2019-10-20 NOTE — Assessment & Plan Note (Signed)
Encouraged to monitor and report any concerns.  no changes to meds. Encouraged heart healthy diet such as the DASH diet and exercise as tolerated.

## 2019-10-20 NOTE — Assessment & Plan Note (Signed)
hgba1c acceptable, minimize simple carbs. Increase exercise as tolerated. Continue meds

## 2019-10-20 NOTE — Telephone Encounter (Signed)
LM for pt to call and schedule lab appt 1st week of January 2021 and to schedule follow up with Dr. Charlett Blake in March or April.

## 2019-10-20 NOTE — Assessment & Plan Note (Signed)
Tolerating statin, encouraged heart healthy diet, avoid trans fats, minimize simple carbs and saturated fats. Increase exercise as tolerated 

## 2019-10-20 NOTE — Assessment & Plan Note (Signed)
She is managing well despite the pandemic.

## 2019-11-01 ENCOUNTER — Ambulatory Visit: Payer: Self-pay | Admitting: *Deleted

## 2019-11-01 NOTE — Telephone Encounter (Signed)
Pt had 4 episodes of uncontrollable diarrhea where she was not able to make it to the bathroom in time.  Pt has previously taken miralax but did not take any yesterday.  Pt wants to know what she needs to do about this.  Pt denies any blood or mucus.

## 2019-11-01 NOTE — Telephone Encounter (Signed)
She should treat this like a gastroenteritis episode and have her drink clear fluids for the next 24 hours making sure some of her fluids are with potassium like Gatorade or Pedialyte. If she develops worsening diarrhea or sees blood or has pain she should go get looked at. After 24 hours then can add simple carbohydrates and try a diet like the BRAT diet (bananas, rice, applesauce and toast) for 24 hours if no improvement she needs an appointment

## 2019-11-01 NOTE — Telephone Encounter (Signed)
Pt had 4 episodes of uncontrollable diarrhea where she was not able to make it to the bathroom in time. Pt has previously taken miralax but did not take any yesterday. Pt wants to know what she needs to do about this. Pt denies any blood or mucus.    Returned call to patient. She stated that she has had uncontrollable soft stools yesterday. She denies it being diarrhea and it is not loose.  She felt like she needed to go to the bathroom and when she gets there she has already started going. She stated that she has not eaten anything new or any new medication. She denies numbness or tingling in her extremities.  She does not want to take medication to prevent stools because of possibility  of constipation. So far today she has not had this problem. She is advised to call back for abd pain, fever, nausea, vomiting or weakness with numbness of tingling in her extremities. She voiced understanding and would like a call back regarding this. Routing to LB at [facility] for review.   Reason for Disposition . Nursing judgment or information in reference  Answer Assessment - Initial Assessment Questions 1. REASON FOR CALL: "What is your main concern right now?"     Having uncontrollable soft stools 2. ONSET: "When did the stooling  start?"     yesterday 3. SEVERITY: "How bad is the stooling?"     uncontrollable 4. FEVER: "Do you have a fever?"     no 5. OTHER SYMPTOMS: "Do you have any other new symptoms?"     no 6. INTERVENTIONS AND RESPONSE: "What have you done so far to try to make this better? What medications have you used?"     nothing 7. PREGNANCY: "Is there any chance you are pregnant?"     n/a  Protocols used: NO GUIDELINE AVAILABLE-A-AH

## 2019-11-02 ENCOUNTER — Other Ambulatory Visit: Payer: Self-pay

## 2019-11-02 ENCOUNTER — Other Ambulatory Visit (INDEPENDENT_AMBULATORY_CARE_PROVIDER_SITE_OTHER): Payer: Medicare Other

## 2019-11-02 DIAGNOSIS — E559 Vitamin D deficiency, unspecified: Secondary | ICD-10-CM | POA: Diagnosis not present

## 2019-11-02 DIAGNOSIS — E785 Hyperlipidemia, unspecified: Secondary | ICD-10-CM

## 2019-11-02 DIAGNOSIS — D649 Anemia, unspecified: Secondary | ICD-10-CM

## 2019-11-02 DIAGNOSIS — E1165 Type 2 diabetes mellitus with hyperglycemia: Secondary | ICD-10-CM

## 2019-11-02 DIAGNOSIS — E1169 Type 2 diabetes mellitus with other specified complication: Secondary | ICD-10-CM

## 2019-11-02 DIAGNOSIS — E1140 Type 2 diabetes mellitus with diabetic neuropathy, unspecified: Secondary | ICD-10-CM

## 2019-11-02 DIAGNOSIS — I10 Essential (primary) hypertension: Secondary | ICD-10-CM | POA: Diagnosis not present

## 2019-11-02 DIAGNOSIS — IMO0002 Reserved for concepts with insufficient information to code with codable children: Secondary | ICD-10-CM

## 2019-11-02 LAB — CBC
HCT: 35.2 % — ABNORMAL LOW (ref 36.0–46.0)
Hemoglobin: 11.6 g/dL — ABNORMAL LOW (ref 12.0–15.0)
MCHC: 33 g/dL (ref 30.0–36.0)
MCV: 90.8 fl (ref 78.0–100.0)
Platelets: 224 10*3/uL (ref 150.0–400.0)
RBC: 3.88 Mil/uL (ref 3.87–5.11)
RDW: 13.9 % (ref 11.5–15.5)
WBC: 8.3 10*3/uL (ref 4.0–10.5)

## 2019-11-02 LAB — COMPREHENSIVE METABOLIC PANEL
ALT: 14 U/L (ref 0–35)
AST: 13 U/L (ref 0–37)
Albumin: 3.7 g/dL (ref 3.5–5.2)
Alkaline Phosphatase: 64 U/L (ref 39–117)
BUN: 12 mg/dL (ref 6–23)
CO2: 28 mEq/L (ref 19–32)
Calcium: 9.2 mg/dL (ref 8.4–10.5)
Chloride: 105 mEq/L (ref 96–112)
Creatinine, Ser: 0.75 mg/dL (ref 0.40–1.20)
GFR: 91.24 mL/min (ref >60.00)
Glucose, Bld: 103 mg/dL — ABNORMAL HIGH (ref 70–99)
Potassium: 3.4 mEq/L — ABNORMAL LOW (ref 3.5–5.1)
Sodium: 143 mEq/L (ref 135–145)
Total Bilirubin: 0.3 mg/dL (ref 0.2–1.2)
Total Protein: 6.3 g/dL (ref 6.0–8.3)

## 2019-11-02 LAB — LIPID PANEL
Cholesterol: 155 mg/dL (ref 0–200)
HDL: 31.5 mg/dL — ABNORMAL LOW (ref >39.00)
LDL Cholesterol: 96 mg/dL (ref 0–99)
NonHDL: 123.73
Total CHOL/HDL Ratio: 5
Triglycerides: 137 mg/dL (ref 0.0–149.0)
VLDL: 27.4 mg/dL (ref 0.0–40.0)

## 2019-11-02 LAB — HEMOGLOBIN A1C: Hgb A1c MFr Bld: 6.7 % — ABNORMAL HIGH (ref 4.6–6.5)

## 2019-11-02 LAB — VITAMIN D 25 HYDROXY (VIT D DEFICIENCY, FRACTURES): VITD: 71.02 ng/mL (ref 30.00–100.00)

## 2019-11-02 NOTE — Telephone Encounter (Signed)
Called patient left message for patient to call the office back  Nurse triage may handle 

## 2019-11-08 DIAGNOSIS — S39012A Strain of muscle, fascia and tendon of lower back, initial encounter: Secondary | ICD-10-CM | POA: Insufficient documentation

## 2019-11-08 DIAGNOSIS — M47816 Spondylosis without myelopathy or radiculopathy, lumbar region: Secondary | ICD-10-CM | POA: Diagnosis not present

## 2019-11-08 DIAGNOSIS — M5416 Radiculopathy, lumbar region: Secondary | ICD-10-CM | POA: Diagnosis not present

## 2019-11-08 DIAGNOSIS — I10 Essential (primary) hypertension: Secondary | ICD-10-CM | POA: Diagnosis not present

## 2019-11-08 DIAGNOSIS — M5126 Other intervertebral disc displacement, lumbar region: Secondary | ICD-10-CM | POA: Diagnosis not present

## 2019-11-08 DIAGNOSIS — Z6831 Body mass index (BMI) 31.0-31.9, adult: Secondary | ICD-10-CM | POA: Diagnosis not present

## 2019-11-08 DIAGNOSIS — M545 Low back pain: Secondary | ICD-10-CM | POA: Diagnosis not present

## 2019-11-20 ENCOUNTER — Other Ambulatory Visit: Payer: Self-pay | Admitting: Family Medicine

## 2019-11-22 ENCOUNTER — Telehealth: Payer: Self-pay | Admitting: *Deleted

## 2019-11-22 NOTE — Telephone Encounter (Signed)
Received call from [facility] stating due to multiple mail delays,  they just received pt's IFOB specimen from September / October and it is too old to test. Pt will need to recollect if test is still needed. If recollection is needed, pt should return specimen directly to our office instead of mailing it to the lab. Please advise?

## 2019-11-22 NOTE — Telephone Encounter (Signed)
Please have her recollect

## 2019-11-27 ENCOUNTER — Other Ambulatory Visit: Payer: Self-pay | Admitting: Family Medicine

## 2019-12-09 DIAGNOSIS — Z79899 Other long term (current) drug therapy: Secondary | ICD-10-CM | POA: Diagnosis not present

## 2019-12-09 DIAGNOSIS — L400 Psoriasis vulgaris: Secondary | ICD-10-CM | POA: Diagnosis not present

## 2019-12-09 DIAGNOSIS — L409 Psoriasis, unspecified: Secondary | ICD-10-CM | POA: Diagnosis not present

## 2019-12-17 ENCOUNTER — Other Ambulatory Visit: Payer: Self-pay | Admitting: Family Medicine

## 2019-12-20 ENCOUNTER — Other Ambulatory Visit: Payer: Self-pay

## 2019-12-20 ENCOUNTER — Ambulatory Visit: Payer: Medicare Other | Admitting: Medical

## 2019-12-22 DIAGNOSIS — L409 Psoriasis, unspecified: Secondary | ICD-10-CM | POA: Diagnosis not present

## 2019-12-28 ENCOUNTER — Other Ambulatory Visit: Payer: Self-pay | Admitting: Family Medicine

## 2019-12-31 ENCOUNTER — Other Ambulatory Visit: Payer: Self-pay | Admitting: Family Medicine

## 2020-01-06 NOTE — Telephone Encounter (Signed)
Left message for pt to return my call.

## 2020-01-10 DIAGNOSIS — M5416 Radiculopathy, lumbar region: Secondary | ICD-10-CM | POA: Diagnosis not present

## 2020-01-10 DIAGNOSIS — M545 Low back pain: Secondary | ICD-10-CM | POA: Diagnosis not present

## 2020-01-10 DIAGNOSIS — Z6831 Body mass index (BMI) 31.0-31.9, adult: Secondary | ICD-10-CM | POA: Diagnosis not present

## 2020-01-10 DIAGNOSIS — M5126 Other intervertebral disc displacement, lumbar region: Secondary | ICD-10-CM | POA: Diagnosis not present

## 2020-01-10 DIAGNOSIS — M47816 Spondylosis without myelopathy or radiculopathy, lumbar region: Secondary | ICD-10-CM | POA: Diagnosis not present

## 2020-01-10 DIAGNOSIS — S39012A Strain of muscle, fascia and tendon of lower back, initial encounter: Secondary | ICD-10-CM | POA: Diagnosis not present

## 2020-01-11 DIAGNOSIS — S39012A Strain of muscle, fascia and tendon of lower back, initial encounter: Secondary | ICD-10-CM | POA: Diagnosis not present

## 2020-01-12 ENCOUNTER — Other Ambulatory Visit: Payer: Self-pay | Admitting: Family Medicine

## 2020-01-12 DIAGNOSIS — S39012D Strain of muscle, fascia and tendon of lower back, subsequent encounter: Secondary | ICD-10-CM | POA: Diagnosis not present

## 2020-01-17 DIAGNOSIS — L400 Psoriasis vulgaris: Secondary | ICD-10-CM | POA: Diagnosis not present

## 2020-01-18 ENCOUNTER — Ambulatory Visit (INDEPENDENT_AMBULATORY_CARE_PROVIDER_SITE_OTHER): Payer: Medicare Other | Admitting: Family Medicine

## 2020-01-18 ENCOUNTER — Other Ambulatory Visit: Payer: Self-pay

## 2020-01-18 DIAGNOSIS — E1169 Type 2 diabetes mellitus with other specified complication: Secondary | ICD-10-CM | POA: Diagnosis not present

## 2020-01-18 DIAGNOSIS — E785 Hyperlipidemia, unspecified: Secondary | ICD-10-CM | POA: Diagnosis not present

## 2020-01-18 DIAGNOSIS — S39012D Strain of muscle, fascia and tendon of lower back, subsequent encounter: Secondary | ICD-10-CM | POA: Diagnosis not present

## 2020-01-18 DIAGNOSIS — M5416 Radiculopathy, lumbar region: Secondary | ICD-10-CM

## 2020-01-18 DIAGNOSIS — E1165 Type 2 diabetes mellitus with hyperglycemia: Secondary | ICD-10-CM

## 2020-01-18 DIAGNOSIS — E1140 Type 2 diabetes mellitus with diabetic neuropathy, unspecified: Secondary | ICD-10-CM | POA: Diagnosis not present

## 2020-01-18 DIAGNOSIS — E559 Vitamin D deficiency, unspecified: Secondary | ICD-10-CM

## 2020-01-18 DIAGNOSIS — L409 Psoriasis, unspecified: Secondary | ICD-10-CM | POA: Diagnosis not present

## 2020-01-18 DIAGNOSIS — I10 Essential (primary) hypertension: Secondary | ICD-10-CM | POA: Diagnosis not present

## 2020-01-18 DIAGNOSIS — IMO0002 Reserved for concepts with insufficient information to code with codable children: Secondary | ICD-10-CM

## 2020-01-18 NOTE — Assessment & Plan Note (Signed)
Is working with ortho and just starting her second week of PT and feels the first week was helpful but then over the weekend her back worsened again. She notes some weakness in her legs that is ongoing. She will discuss with them and let us know if worsens

## 2020-01-18 NOTE — Assessment & Plan Note (Signed)
Monitor and report any concerns. no changes to meds. Encouraged heart healthy diet such as the DASH diet and exercise as tolerated.  

## 2020-01-18 NOTE — Assessment & Plan Note (Signed)
Working with dermatology to get her on Humira. Having trouble with the cost. Has bad lesions under breasts. She is encouraged to try the Humira if she is willing.

## 2020-01-18 NOTE — Progress Notes (Signed)
Virtual Visit via phone Note  I connected with Heidi Franklin on 01/18/20 at  9:40 AM EDT by a phone enabled telemedicine application and verified that I am speaking with the correct person using two identifiers.  Location: Patient: home Provider: office   I discussed the limitations of evaluation and management by telemedicine and the availability of in person appointments. The patient expressed understanding and agreed to proceed. Kem Boroughs, CMA was able to get the patient set up on a visit, phone after being unable to set up on a video visit     Subjective:    Patient ID: Heidi Franklin, female    DOB: Nov 30, 1945, 74 y.o.   MRN: [id number]  No chief complaint on file.   HPI Patient is in today for follow up on chronic medical concerns. No recent febrile illness or hospitalizations. She is struggling with her psoriasis and working with dermatology to try and get Humira approved. She is hesitant to try it but is reassured that it can be very helpful so should give it a couple month trial to see how she can improve her quality of life. Her back pain, lower with some lower extremity weakness persists and she is working with PT for her second week now that seems to be helping some. No recent falls or trauma. No polyuria or polydipsia endorsed. Denies CP/palp/SOB/HA/congestion/fevers/GI or GU c/o. Taking meds as prescribed  Past Medical History:  Diagnosis Date  . ALLERGIC RHINITIS   . Allergy   . Anxiety state, unspecified   . Arthritis   . Asthma   . BRONCHITIS, CHRONIC   . Chronic low back pain 10/09/2016  . DEPRESSION   . DIABETES MELLITUS, TYPE II   . GERD   . HYPERLIPIDEMIA   . HYPERTENSION   . Psoriasis     Past Surgical History:  Procedure Laterality Date  . ABDOMINAL HYSTERECTOMY  1980  . TRIGGER FINGER RELEASE     Index finger    Family History  Problem Relation Age of Onset  . Stroke Mother   . Angina Mother   . Hyperlipidemia Mother   . Hypertension  Mother   . Heart disease Mother   . Depression Mother   . Diabetes Father   . Hyperlipidemia Other        Parent  . Hypertension Other        Parent  . Diabetes Sister        x1  . CAD Sister   . COPD Sister        smoker  . Hyperlipidemia Sister   . Hypertension Sister   . Diabetes Brother        x 2  . Hyperlipidemia Brother   . Hypertension Brother   . Diabetes Brother   . Hyperlipidemia Brother   . Hypertension Brother   . CAD Sister   . Arthritis Sister   . Hyperlipidemia Sister   . Hypertension Sister   . Colon cancer Neg Hx   . Esophageal cancer Neg Hx   . Rectal cancer Neg Hx   . Stomach cancer Neg Hx   . Pancreatic cancer Neg Hx     Social History   Socioeconomic History  . Marital status: Married    Spouse name: Not on file  . Number of children: 0  . Years of education: 29yr colge  . Highest education level: Not on file  Occupational History  . Occupation: Retired   Tobacco Use  . Smoking status: Never Smoker  .  Smokeless tobacco: Never Used  Substance and Sexual Activity  . Alcohol use: No    Alcohol/week: 0.0 standard drinks  . Drug use: No  . Sexual activity: Not Currently  Other Topics Concern  . Not on file  Social History Narrative   Married, lives with spouse-retired from [employer] insurance   Not employed    Drinks coffee occasional, Consumes 1 soda a day    No dietary restrictions   Social Determinants of Radio broadcast assistant Strain:   . Difficulty of Paying Living Expenses:   Food Insecurity:   . Worried About Charity fundraiser in the Last Year:   . Arboriculturist in the Last Year:   Transportation Needs:   . Film/video editor (Medical):   Marland Kitchen Lack of Transportation (Non-Medical):   Physical Activity:   . Days of Exercise per Week:   . Minutes of Exercise per Session:   Stress:   . Feeling of Stress :   Social Connections:   . Frequency of Communication with Friends and Family:   . Frequency of Social Gatherings  with Friends and Family:   . Attends Religious Services:   . Active Member of Clubs or Organizations:   . Attends Archivist Meetings:   Marland Kitchen Marital Status:   Intimate Partner Violence:   . Fear of Current or Ex-Partner:   . Emotionally Abused:   Marland Kitchen Physically Abused:   . Sexually Abused:     Outpatient Medications Prior to Visit  Medication Sig Dispense Refill  . acetaminophen (TYLENOL) 325 MG tablet Take 650 mg by mouth every 6 (six) hours as needed.    Marland Kitchen albuterol (VENTOLIN HFA) 108 (90 Base) MCG/ACT inhaler Inhale 1-2 puffs into the lungs every 6 (six) hours as needed for wheezing or shortness of breath. 1 Inhaler 3  . ALPRAZolam (XANAX) 0.5 MG tablet Take 1 tablet by mouth every 8 hours if needed for anxiety. 30 tablet 2  . amLODipine (NORVASC) 10 MG tablet TAKE 1 TABLET BY MOUTH EVERY DAY 90 tablet 1  . gabapentin (NEURONTIN) 100 MG capsule TAKE 2 CAPSULES BY MOUTH AT BEDTIME. 180 capsule 2  . glucose blood (ONE TOUCH ULTRA TEST) test strip 1 each by Other route daily. Use 1 strips to check blood sugar twice a day Ex E11.9 100 each 3  . glyBURIDE (DIABETA) 2.5 MG tablet Take 1 tablet (2.5 mg total) by mouth 2 (two) times daily with a meal. 180 tablet 1  . hydrochlorothiazide (HYDRODIURIL) 12.5 MG tablet TAKE 1 TABLET BY MOUTH EVERY DAY 90 tablet 1  . ketoconazole (NIZORAL) 2 % cream 1 APPLICATION APPLY ON THE SKIN TWICE A DAY APPLY 2 WEEKS ON, 1 WEEK OFF. REPEAT AS NEEDED.    Marland Kitchen Lancets (ONETOUCH ULTRASOFT) lancets PRN 100 each 3  . losartan (COZAAR) 100 MG tablet Take 1 tablet (100 mg total) by mouth daily. 90 tablet 1  . lovastatin (MEVACOR) 20 MG tablet TAKE 1 TABLET BY MOUTH EVERY DAY AT BEDTIME. PLEASE SEND FUTURE REQUESTS TO NEW DOCTOR. 30 tablet 0  . meloxicam (MOBIC) 7.5 MG tablet Take 1 tablet (7.5 mg total) by mouth daily. 30 tablet 0  . mometasone (ELOCON) 0.1 % ointment     . montelukast (SINGULAIR) 10 MG tablet TAKE 1 TABLET BY MOUTH EVERYDAY AT BEDTIME 90 tablet  1  . Multiple Vitamins-Minerals (CENTRUM SILVER PO) Take 1 tablet by mouth every morning.    Marland Kitchen omeprazole (PRILOSEC) 40 MG capsule  TAKE 1 CAPSULE BY MOUTH EVERY DAY 90 capsule 1  . Potassium Chloride ER 20 MEQ TBCR TAKE 1 TABLET BY MOUTH EVERY DAY 90 tablet 1  . Respiratory Therapy Supplies (FLUTTER) DEVI Use as directed 1 each 0  . tiZANidine (ZANAFLEX) 2 MG tablet Take 0.5-2 tablets (1-4 mg total) by mouth 2 (two) times daily as needed for muscle spasms. 40 tablet 1  . valACYclovir (VALTREX) 500 MG tablet Take 500 mg by mouth 2 (two) times daily.  0  . Vitamin D, Ergocalciferol, (DRISDOL) 1.25 MG (50000 UT) CAPS capsule TAKE 1 CAPSULE (50,000 UNITS TOTAL) BY MOUTH EVERY 7 (SEVEN) DAYS. 12 capsule 0   No facility-administered medications prior to visit.    Allergies  Allergen Reactions  . Metformin And Related Diarrhea  . Penicillins Rash    Review of Systems  Constitutional: Negative for fever and malaise/fatigue.  HENT: Negative for congestion.   Eyes: Negative for blurred vision.  Respiratory: Negative for shortness of breath.   Cardiovascular: Negative for chest pain, palpitations and leg swelling.  Gastrointestinal: Negative for abdominal pain, blood in stool and nausea.  Genitourinary: Negative for dysuria and frequency.  Musculoskeletal: Negative for falls.  Skin: Negative for rash.  Neurological: Negative for dizziness, loss of consciousness and headaches.  Endo/Heme/Allergies: Negative for environmental allergies.  Psychiatric/Behavioral: Negative for depression. The patient is not nervous/anxious.        Objective:    Physical Exam unable to obtain via phone  There were no vitals taken for this visit. Wt Readings from Last 3 Encounters:  07/20/19 151 lb 9.6 oz (68.8 kg)  07/02/19 153 lb (69.4 kg)  04/13/19 159 lb (72.1 kg)    Diabetic Foot Exam - Simple   No data filed     Lab Results  Component Value Date   WBC 8.3 11/02/2019   HGB 11.6 (L) 11/02/2019     HCT 35.2 (L) 11/02/2019   PLT 224.0 11/02/2019   GLUCOSE 103 (H) 11/02/2019   CHOL 155 11/02/2019   TRIG 137.0 11/02/2019   HDL 31.50 (L) 11/02/2019   LDLDIRECT 155.1 02/09/2013   LDLCALC 96 11/02/2019   ALT 14 11/02/2019   AST 13 11/02/2019   NA 143 11/02/2019   K 3.4 (L) 11/02/2019   CL 105 11/02/2019   CREATININE 0.75 11/02/2019   BUN 12 11/02/2019   CO2 28 11/02/2019   TSH 2.07 06/21/2019   HGBA1C 6.7 (H) 11/02/2019   MICROALBUR 1.6 08/08/2016    Lab Results  Component Value Date   TSH 2.07 06/21/2019   Lab Results  Component Value Date   WBC 8.3 11/02/2019   HGB 11.6 (L) 11/02/2019   HCT 35.2 (L) 11/02/2019   MCV 90.8 11/02/2019   PLT 224.0 11/02/2019   Lab Results  Component Value Date   NA 143 11/02/2019   K 3.4 (L) 11/02/2019   CO2 28 11/02/2019   GLUCOSE 103 (H) 11/02/2019   BUN 12 11/02/2019   CREATININE 0.75 11/02/2019   BILITOT 0.3 11/02/2019   ALKPHOS 64 11/02/2019   AST 13 11/02/2019   ALT 14 11/02/2019   PROT 6.3 11/02/2019   ALBUMIN 3.7 11/02/2019   CALCIUM 9.2 11/02/2019   ANIONGAP 7 10/14/2015   GFR 91.24 11/02/2019   Lab Results  Component Value Date   CHOL 155 11/02/2019   Lab Results  Component Value Date   HDL 31.50 (L) 11/02/2019   Lab Results  Component Value Date   LDLCALC 96 11/02/2019  Lab Results  Component Value Date   TRIG 137.0 11/02/2019   Lab Results  Component Value Date   CHOLHDL 5 11/02/2019   Lab Results  Component Value Date   HGBA1C 6.7 (H) 11/02/2019       Assessment & Plan:   Problem List Items Addressed This Visit    Type 2 diabetes, uncontrolled, with neuropathy ([place])    hgba1c acceptable, minimize simple carbs. Increase exercise as tolerated. Continue current meds       Hyperlipidemia associated with type 2 diabetes mellitus ([place])    Encouraged heart healthy diet, increase exercise, avoid trans fats, consider a krill oil cap daily      Essential hypertension    Monitor and report  any concerns. no changes to meds. Encouraged heart healthy diet such as the DASH diet and exercise as tolerated.       Psoriasis    Working with dermatology to get her on Humira. Having trouble with the cost. Has bad lesions under breasts. She is encouraged to try the Humira if she is willing.      Lumbar radiculopathy    Is working with ortho and just starting her second week of PT and feels the first week was helpful but then over the weekend her back worsened again. She notes some weakness in her legs that is ongoing. She will discuss with them and let us know if worsens      Vitamin D deficiency    Supplement and monitor         I am having Heidi Franklin maintain her Multiple Vitamins-Minerals (CENTRUM SILVER PO), Flutter, mometasone, acetaminophen, albuterol, valACYclovir, onetouch ultrasoft, glucose blood, omeprazole, meloxicam, Vitamin D (Ergocalciferol), amLODipine, tiZANidine, ketoconazole, ALPRAZolam, losartan, montelukast, glyBURIDE, Potassium Chloride ER, gabapentin, hydrochlorothiazide, and lovastatin.  No orders of the defined types were placed in this encounter.   I discussed the assessment and treatment plan with the patient. The patient was provided an opportunity to ask questions and all were answered. The patient agreed with the plan and demonstrated an understanding of the instructions.   The patient was advised to call back or seek an in-person evaluation if the symptoms worsen or if the condition fails to improve as anticipated.  I provided 25 minutes of non-face-to-face time during this encounter.   Penni Homans, MD

## 2020-01-18 NOTE — Assessment & Plan Note (Signed)
hgba1c acceptable, minimize simple carbs. Increase exercise as tolerated. Continue current meds 

## 2020-01-18 NOTE — Assessment & Plan Note (Signed)
Supplement and monitor 

## 2020-01-18 NOTE — Assessment & Plan Note (Signed)
Encouraged heart healthy diet, increase exercise, avoid trans fats, consider a krill oil cap daily 

## 2020-01-20 DIAGNOSIS — S39012D Strain of muscle, fascia and tendon of lower back, subsequent encounter: Secondary | ICD-10-CM | POA: Diagnosis not present

## 2020-01-21 ENCOUNTER — Telehealth: Payer: Self-pay

## 2020-01-21 NOTE — Telephone Encounter (Signed)
Patient notified and MAR updated.

## 2020-01-21 NOTE — Telephone Encounter (Signed)
Patient had spoke with someone about her medications and they noticed that she was taking the vitamin d 50000 weekly and the vitamin d3 2000iu daily.  They advised that she should call her doctor to see if she suppose to be on both?  The vitamin D 50000 was prescribed by Dr. Tamala Julian- sports medicine back on 07/18/19 to take for 3 months (not sure why patient still has any left)   Results of past vit d levels 06/21/19 85.14  done by Dr. Tamala Julian 11/02/19 71.02 done by you

## 2020-01-21 NOTE — Telephone Encounter (Signed)
Patient called in to see if the nurse or Dr. Charlett Blake could answer some questions about her medication Vitamin D, Ergocalciferol, (DRISDOL) 1.25 MG (50000 UT) CAPS capsule [id number]  Please give the patient a call and advise at [phone]

## 2020-01-21 NOTE — Telephone Encounter (Signed)
At her level of Vitamin D she no longer needs the 50000 IU weekly. She can just stay on the daily, please update her MAR and then recheck labs in 3 months

## 2020-01-25 DIAGNOSIS — S39012D Strain of muscle, fascia and tendon of lower back, subsequent encounter: Secondary | ICD-10-CM | POA: Diagnosis not present

## 2020-01-26 ENCOUNTER — Other Ambulatory Visit (INDEPENDENT_AMBULATORY_CARE_PROVIDER_SITE_OTHER): Payer: Medicare Other

## 2020-01-26 DIAGNOSIS — D649 Anemia, unspecified: Secondary | ICD-10-CM | POA: Diagnosis not present

## 2020-01-26 LAB — FECAL OCCULT BLOOD, IMMUNOCHEMICAL: Fecal Occult Bld: NEGATIVE

## 2020-01-27 DIAGNOSIS — S39012D Strain of muscle, fascia and tendon of lower back, subsequent encounter: Secondary | ICD-10-CM | POA: Diagnosis not present

## 2020-01-28 ENCOUNTER — Other Ambulatory Visit: Payer: Self-pay | Admitting: Family Medicine

## 2020-02-01 ENCOUNTER — Other Ambulatory Visit: Payer: Self-pay | Admitting: Family Medicine

## 2020-02-01 DIAGNOSIS — S39012D Strain of muscle, fascia and tendon of lower back, subsequent encounter: Secondary | ICD-10-CM | POA: Diagnosis not present

## 2020-02-01 MED ORDER — GLIMEPIRIDE 1 MG PO TABS: 1.0000 mg | ORAL_TABLET | Freq: Every day | ORAL | 3 refills | Status: DC

## 2020-02-01 NOTE — Telephone Encounter (Signed)
Glyburide 2.5mg  not covered.  Alternatives that are covered are glimepiride 1mg  and glipizide.

## 2020-02-03 ENCOUNTER — Other Ambulatory Visit: Payer: Self-pay | Admitting: Family Medicine

## 2020-02-03 DIAGNOSIS — S39012D Strain of muscle, fascia and tendon of lower back, subsequent encounter: Secondary | ICD-10-CM | POA: Diagnosis not present

## 2020-02-08 ENCOUNTER — Other Ambulatory Visit: Payer: Self-pay | Admitting: Family Medicine

## 2020-02-08 ENCOUNTER — Encounter: Payer: Self-pay | Admitting: Family Medicine

## 2020-02-08 DIAGNOSIS — S39012D Strain of muscle, fascia and tendon of lower back, subsequent encounter: Secondary | ICD-10-CM | POA: Diagnosis not present

## 2020-02-08 MED ORDER — LOSARTAN POTASSIUM-HCTZ 100-12.5 MG PO TABS: 1.0000 | ORAL_TABLET | Freq: Every day | ORAL | 1 refills | Status: DC

## 2020-02-11 DIAGNOSIS — S39012D Strain of muscle, fascia and tendon of lower back, subsequent encounter: Secondary | ICD-10-CM | POA: Diagnosis not present

## 2020-02-15 DIAGNOSIS — S39012D Strain of muscle, fascia and tendon of lower back, subsequent encounter: Secondary | ICD-10-CM | POA: Diagnosis not present

## 2020-02-16 ENCOUNTER — Other Ambulatory Visit: Payer: Self-pay | Admitting: Family Medicine

## 2020-02-17 DIAGNOSIS — S39012D Strain of muscle, fascia and tendon of lower back, subsequent encounter: Secondary | ICD-10-CM | POA: Diagnosis not present

## 2020-02-19 ENCOUNTER — Other Ambulatory Visit: Payer: Self-pay | Admitting: Family Medicine

## 2020-02-22 DIAGNOSIS — S39012D Strain of muscle, fascia and tendon of lower back, subsequent encounter: Secondary | ICD-10-CM | POA: Diagnosis not present

## 2020-02-24 DIAGNOSIS — S39012D Strain of muscle, fascia and tendon of lower back, subsequent encounter: Secondary | ICD-10-CM | POA: Diagnosis not present

## 2020-03-01 ENCOUNTER — Other Ambulatory Visit: Payer: Self-pay | Admitting: *Deleted

## 2020-03-01 MED ORDER — LOVASTATIN 20 MG PO TABS: 20.0000 mg | ORAL_TABLET | Freq: Every day | ORAL | 1 refills | Status: DC

## 2020-03-02 DIAGNOSIS — L408 Other psoriasis: Secondary | ICD-10-CM | POA: Diagnosis not present

## 2020-03-02 DIAGNOSIS — S39012D Strain of muscle, fascia and tendon of lower back, subsequent encounter: Secondary | ICD-10-CM | POA: Diagnosis not present

## 2020-03-07 DIAGNOSIS — S39012D Strain of muscle, fascia and tendon of lower back, subsequent encounter: Secondary | ICD-10-CM | POA: Diagnosis not present

## 2020-03-09 ENCOUNTER — Telehealth: Payer: Self-pay | Admitting: *Deleted

## 2020-03-09 DIAGNOSIS — S39012D Strain of muscle, fascia and tendon of lower back, subsequent encounter: Secondary | ICD-10-CM | POA: Diagnosis not present

## 2020-03-09 NOTE — Telephone Encounter (Signed)
Patient calling and has a sore on left great toe that has a possible ulcer, first noticed 1 week ago,has been using Neosporin on it. Called patient back and informed that she should schedule an appointment asap.  Gave message to scheduler for an upcoming visit.

## 2020-03-10 ENCOUNTER — Ambulatory Visit (INDEPENDENT_AMBULATORY_CARE_PROVIDER_SITE_OTHER): Payer: Medicare Other | Admitting: Podiatry

## 2020-03-10 ENCOUNTER — Other Ambulatory Visit: Payer: Self-pay

## 2020-03-10 ENCOUNTER — Encounter: Payer: Self-pay | Admitting: Podiatry

## 2020-03-10 VITALS — Temp 98.2°F

## 2020-03-10 DIAGNOSIS — E119 Type 2 diabetes mellitus without complications: Secondary | ICD-10-CM

## 2020-03-10 DIAGNOSIS — L97521 Non-pressure chronic ulcer of other part of left foot limited to breakdown of skin: Secondary | ICD-10-CM

## 2020-03-10 DIAGNOSIS — L97509 Non-pressure chronic ulcer of other part of unspecified foot with unspecified severity: Secondary | ICD-10-CM | POA: Insufficient documentation

## 2020-03-10 NOTE — Patient Instructions (Signed)

## 2020-03-10 NOTE — Progress Notes (Signed)
This patient presents to the office with a skin lesion that has developed on the top of her big toe left foot.  She says this is been present for approximately 2 weeks and she has been bandaging this skin lesion with Neosporin and a Band-Aid.  She believes it may have started with wearing her shoes that she has now developed a closing skin lesion on the top of her left big toe.  Patient states that she is diabetic and is concerned about the soreness of the skin lesion but denies any drainage redness or infection at the site of the skin lesion.  She presents the office today for continued evaluation of this skin lesion great toe left foot.General Appearance  Alert, conversant and in no acute stress.  Vascular  Dorsalis pedis and posterior tibial  pulses are palpable  bilaterally.  Capillary return is within normal limits  bilaterally. Temperature is within normal limits  bilaterally.  Neurologic  Senn-Weinstein monofilament wire test within normal limits  bilaterally. Muscle power within normal limits bilaterally.  Nails Thick disfigured discolored nails with subungual debris  from hallux to fifth toes bilaterally. No complaints today. No evidence of bacterial infection or drainage bilaterally.  Orthopedic  No limitations of motion  feet .  No crepitus or effusions noted.  No bony pathology or digital deformities noted.Pes planus.  Skin  normotropic skin with no porokeratosis noted bilaterally.  No signs of infections or ulcers noted.  Measured 2 mm. X 2 mm. Ulceration/skin lesion left hallux.  No redness or swelling or drainage.  Toe ulcer left hallux.  ROV.  Discussed the skin lesion with this patient.  Told her this skin lesion appears to be healing over the last 2 weeks and has been closing  based on her initial description of the lesion.  Told her that she is experiencing pain due to the fact this lesion is still open and healing.  Bandaged the lesion with Neosporin dry sterile dressing and a gel  toe cap.  Gave her home instructions for soaks.  Told her she could be aggravating this lesion as she wears her regular foot care and told her to wear sandals or loose/soft shoes.  Told her to let this lesion declare itself and to return to the office if pain or problem persists.   Gardiner Barefoot DPM

## 2020-03-16 ENCOUNTER — Other Ambulatory Visit: Payer: Self-pay | Admitting: Family Medicine

## 2020-03-17 DIAGNOSIS — L408 Other psoriasis: Secondary | ICD-10-CM | POA: Diagnosis not present

## 2020-03-17 DIAGNOSIS — S39012D Strain of muscle, fascia and tendon of lower back, subsequent encounter: Secondary | ICD-10-CM | POA: Diagnosis not present

## 2020-03-23 ENCOUNTER — Other Ambulatory Visit: Payer: Self-pay

## 2020-03-23 ENCOUNTER — Ambulatory Visit (INDEPENDENT_AMBULATORY_CARE_PROVIDER_SITE_OTHER): Payer: Medicare Other | Admitting: Family Medicine

## 2020-03-23 VITALS — BP 128/70 | HR 90 | Temp 98.4°F | Resp 12 | Ht 60.0 in | Wt 156.8 lb

## 2020-03-23 DIAGNOSIS — R05 Cough: Secondary | ICD-10-CM

## 2020-03-23 DIAGNOSIS — R059 Cough, unspecified: Secondary | ICD-10-CM

## 2020-03-23 DIAGNOSIS — E1165 Type 2 diabetes mellitus with hyperglycemia: Secondary | ICD-10-CM

## 2020-03-23 DIAGNOSIS — I10 Essential (primary) hypertension: Secondary | ICD-10-CM

## 2020-03-23 DIAGNOSIS — M109 Gout, unspecified: Secondary | ICD-10-CM | POA: Diagnosis not present

## 2020-03-23 DIAGNOSIS — E1140 Type 2 diabetes mellitus with diabetic neuropathy, unspecified: Secondary | ICD-10-CM | POA: Diagnosis not present

## 2020-03-23 DIAGNOSIS — E785 Hyperlipidemia, unspecified: Secondary | ICD-10-CM

## 2020-03-23 DIAGNOSIS — D649 Anemia, unspecified: Secondary | ICD-10-CM | POA: Diagnosis not present

## 2020-03-23 DIAGNOSIS — E559 Vitamin D deficiency, unspecified: Secondary | ICD-10-CM

## 2020-03-23 DIAGNOSIS — R600 Localized edema: Secondary | ICD-10-CM

## 2020-03-23 DIAGNOSIS — E1169 Type 2 diabetes mellitus with other specified complication: Secondary | ICD-10-CM | POA: Diagnosis not present

## 2020-03-23 DIAGNOSIS — IMO0002 Reserved for concepts with insufficient information to code with codable children: Secondary | ICD-10-CM

## 2020-03-23 DIAGNOSIS — L409 Psoriasis, unspecified: Secondary | ICD-10-CM | POA: Diagnosis not present

## 2020-03-23 LAB — COMPREHENSIVE METABOLIC PANEL
ALT: 15 U/L (ref 0–35)
AST: 18 U/L (ref 0–37)
Albumin: 4 g/dL (ref 3.5–5.2)
Alkaline Phosphatase: 57 U/L (ref 39–117)
BUN: 12 mg/dL (ref 6–23)
CO2: 29 mEq/L (ref 19–32)
Calcium: 9.5 mg/dL (ref 8.4–10.5)
Chloride: 106 mEq/L (ref 96–112)
Creatinine, Ser: 0.71 mg/dL (ref 0.40–1.20)
GFR: 97.1 mL/min (ref >60.00)
Glucose, Bld: 135 mg/dL — ABNORMAL HIGH (ref 70–99)
Potassium: 3.6 mEq/L (ref 3.5–5.1)
Sodium: 141 mEq/L (ref 135–145)
Total Bilirubin: 0.3 mg/dL (ref 0.2–1.2)
Total Protein: 6.7 g/dL (ref 6.0–8.3)

## 2020-03-23 LAB — CBC
HCT: 35.9 % — ABNORMAL LOW (ref 36.0–46.0)
Hemoglobin: 11.9 g/dL — ABNORMAL LOW (ref 12.0–15.0)
MCHC: 33 g/dL (ref 30.0–36.0)
MCV: 88.9 fl (ref 78.0–100.0)
Platelets: 221 10*3/uL (ref 150.0–400.0)
RBC: 4.04 Mil/uL (ref 3.87–5.11)
RDW: 13.6 % (ref 11.5–15.5)
WBC: 6.5 10*3/uL (ref 4.0–10.5)

## 2020-03-23 LAB — TSH: TSH: 2.96 u[IU]/mL (ref 0.35–4.50)

## 2020-03-23 LAB — HEMOGLOBIN A1C: Hgb A1c MFr Bld: 6.5 % (ref 4.6–6.5)

## 2020-03-23 LAB — LIPID PANEL
Cholesterol: 160 mg/dL (ref 0–200)
HDL: 34.1 mg/dL — ABNORMAL LOW (ref >39.00)
LDL Cholesterol: 103 mg/dL — ABNORMAL HIGH (ref 0–99)
NonHDL: 125.58
Total CHOL/HDL Ratio: 5
Triglycerides: 111 mg/dL (ref 0.0–149.0)
VLDL: 22.2 mg/dL (ref 0.0–40.0)

## 2020-03-23 LAB — VITAMIN D 25 HYDROXY (VIT D DEFICIENCY, FRACTURES): VITD: 80.09 ng/mL (ref 30.00–100.00)

## 2020-03-23 LAB — URIC ACID: Uric Acid, Serum: 5.3 mg/dL (ref 2.4–7.0)

## 2020-03-23 MED ORDER — FUROSEMIDE 20 MG PO TABS
20.0000 mg | ORAL_TABLET | Freq: Every day | ORAL | 3 refills | Status: DC | PRN
Start: 2020-03-23 — End: 2020-06-15

## 2020-03-23 NOTE — Assessment & Plan Note (Signed)
Well controlled, no changes to meds. Encouraged heart healthy diet such as the DASH diet and exercise as tolerated.  °

## 2020-03-23 NOTE — Patient Instructions (Signed)
Blood pressure ranges 100-140 over 60-90 Edema  Edema is an abnormal buildup of fluids in the body tissues and under the skin. Swelling of the legs, feet, and ankles is a common symptom that becomes more likely as you get older. Swelling is also common in looser tissues, like around the eyes. When the affected area is squeezed, the fluid may move out of that spot and leave a dent for a few moments. This dent is called pitting edema. There are many possible causes of edema. Eating too much salt (sodium) and being on your feet or sitting for a long time can cause edema in your legs, feet, and ankles. Hot weather may make edema worse. Common causes of edema include:  Heart failure.  Liver or kidney disease.  Weak leg blood vessels.  Cancer.  An injury.  Pregnancy.  Medicines.  Being obese.  Low protein levels in the blood. Edema is usually painless. Your skin may look swollen or shiny. Follow these instructions at home:  Keep the affected body part raised (elevated) above the level of your heart when you are sitting or lying down.  Do not sit still or stand for long periods of time.  Do not wear tight clothing. Do not wear garters on your upper legs.  Exercise your legs to get your circulation going. This helps to move the fluid back into your blood vessels, and it may help the swelling go down.  Wear elastic bandages or support stockings to reduce swelling as told by your health care provider.  Eat a low-salt (low-sodium) diet to reduce fluid as told by your health care provider.  Depending on the cause of your swelling, you may need to limit how much fluid you drink (fluid restriction).  Take over-the-counter and prescription medicines only as told by your health care provider. Contact a health care provider if:  Your edema does not get better with treatment.  You have heart, liver, or kidney disease and have symptoms of edema.  You have sudden and unexplained weight  gain. Get help right away if:  You develop shortness of breath or chest pain.  You cannot breathe when you lie down.  You develop pain, redness, or warmth in the swollen areas.  You have heart, liver, or kidney disease and suddenly get edema.  You have a fever and your symptoms suddenly get worse. Summary  Edema is an abnormal buildup of fluids in the body tissues and under the skin.  Eating too much salt (sodium) and being on your feet or sitting for a long time can cause edema in your legs, feet, and ankles.  Keep the affected body part raised (elevated) above the level of your heart when you are sitting or lying down. This information is not intended to replace advice given to you by your health care provider. Make sure you discuss any questions you have with your health care provider. Document Revised: 03/03/2019 Document Reviewed: 11/16/2016 Elsevier Patient Education  [place].

## 2020-03-27 DIAGNOSIS — R600 Localized edema: Secondary | ICD-10-CM | POA: Insufficient documentation

## 2020-03-27 NOTE — Progress Notes (Signed)
Subjective:    Patient ID: Heidi Franklin, female    DOB: 07-Apr-1946, 74 y.o.   MRN: [id number]  Chief Complaint  Patient presents with  . Diabetes  . Foot Swelling    bilateral  . fever blister    HPI Patient is in today for follow up on chronic medical concerns. No recent febrile illness or hospitalizations. She has been dealing with a great deal of stress due to family illnesses including sister in law. She had some side effects with Humira use recently. She had a rash and some GI upset. These symptoms are improving. Denies CP/palp/SOB/HA/congestion/fevers/GI or GU c/o. Taking meds as prescribed  Past Medical History:  Diagnosis Date  . ALLERGIC RHINITIS   . Allergy   . Anxiety state, unspecified   . Arthritis   . Asthma   . BRONCHITIS, CHRONIC   . Chronic low back pain 10/09/2016  . DEPRESSION   . DIABETES MELLITUS, TYPE II   . GERD   . HYPERLIPIDEMIA   . HYPERTENSION   . Psoriasis     Past Surgical History:  Procedure Laterality Date  . ABDOMINAL HYSTERECTOMY  1980  . TRIGGER FINGER RELEASE     Index finger    Family History  Problem Relation Age of Onset  . Stroke Mother   . Angina Mother   . Hyperlipidemia Mother   . Hypertension Mother   . Heart disease Mother   . Depression Mother   . Diabetes Father   . Hyperlipidemia Other        Parent  . Hypertension Other        Parent  . Diabetes Sister        x1  . CAD Sister   . COPD Sister        smoker  . Hyperlipidemia Sister   . Hypertension Sister   . Diabetes Brother        x 2  . Hyperlipidemia Brother   . Hypertension Brother   . Diabetes Brother   . Hyperlipidemia Brother   . Hypertension Brother   . CAD Sister   . Arthritis Sister   . Hyperlipidemia Sister   . Hypertension Sister   . Colon cancer Neg Hx   . Esophageal cancer Neg Hx   . Rectal cancer Neg Hx   . Stomach cancer Neg Hx   . Pancreatic cancer Neg Hx     Social History   Socioeconomic History  . Marital status: Married     Spouse name: Not on file  . Number of children: 0  . Years of education: 81yr colge  . Highest education level: Not on file  Occupational History  . Occupation: Retired   Tobacco Use  . Smoking status: Never Smoker  . Smokeless tobacco: Never Used  Substance and Sexual Activity  . Alcohol use: No    Alcohol/week: 0.0 standard drinks  . Drug use: No  . Sexual activity: Not Currently  Other Topics Concern  . Not on file  Social History Narrative   Married, lives with spouse-retired from [employer] insurance   Not employed    Drinks coffee occasional, Consumes 1 soda a day    No dietary restrictions   Social Determinants of Radio broadcast assistant Strain:   . Difficulty of Paying Living Expenses:   Food Insecurity:   . Worried About Charity fundraiser in the Last Year:   . Arboriculturist in the Last Year:   Transportation Needs:   .  Lack of Transportation (Medical):   Marland Kitchen Lack of Transportation (Non-Medical):   Physical Activity:   . Days of Exercise per Week:   . Minutes of Exercise per Session:   Stress:   . Feeling of Stress :   Social Connections:   . Frequency of Communication with Friends and Family:   . Frequency of Social Gatherings with Friends and Family:   . Attends Religious Services:   . Active Member of Clubs or Organizations:   . Attends Archivist Meetings:   Marland Kitchen Marital Status:   Intimate Partner Violence:   . Fear of Current or Ex-Partner:   . Emotionally Abused:   Marland Kitchen Physically Abused:   . Sexually Abused:     Outpatient Medications Prior to Visit  Medication Sig Dispense Refill  . acetaminophen (TYLENOL) 325 MG tablet Take 650 mg by mouth every 6 (six) hours as needed.    Marland Kitchen albuterol (VENTOLIN HFA) 108 (90 Base) MCG/ACT inhaler Inhale 1-2 puffs into the lungs every 6 (six) hours as needed for wheezing or shortness of breath. 1 Inhaler 3  . ALPRAZolam (XANAX) 0.5 MG tablet Take 1 tablet by mouth every 8 hours if needed for anxiety. 30  tablet 2  . amLODipine (NORVASC) 10 MG tablet TAKE 1 TABLET BY MOUTH EVERY DAY 90 tablet 1  . betamethasone dipropionate (DIPROLENE) 0.05 % ointment Apply to affected areas twice a day and then as needed flare/itch    . Cholecalciferol (VITAMIN D3) 50 MCG (2000 UT) capsule Take 2,000 Units by mouth daily.    . fluticasone (CUTIVATE) 0.05 % cream APPLY TO AFFECTED AREA TWICE A DAY 1 WEEK ON 1 WEEK OFF PRN FLARE    . gabapentin (NEURONTIN) 100 MG capsule TAKE 2 CAPSULES BY MOUTH AT BEDTIME. 180 capsule 2  . glimepiride (AMARYL) 1 MG tablet Take 1 tablet (1 mg total) by mouth daily with breakfast. 30 tablet 0  . glucose blood (ONE TOUCH ULTRA TEST) test strip 1 each by Other route daily. Use 1 strips to check blood sugar twice a day Ex E11.9 100 each 3  . HUMIRA PEN-PS/UV/ADOL HS START 40 MG/0.8ML PNKT     . Lancets (ONETOUCH ULTRASOFT) lancets PRN 100 each 3  . lovastatin (MEVACOR) 20 MG tablet Take 1 tablet (20 mg total) by mouth at bedtime. 90 tablet 1  . mometasone (ELOCON) 0.1 % ointment     . montelukast (SINGULAIR) 10 MG tablet TAKE 1 TABLET BY MOUTH EVERYDAY AT BEDTIME 90 tablet 1  . Multiple Vitamins-Minerals (CENTRUM SILVER PO) Take 1 tablet by mouth every morning.    Marland Kitchen omeprazole (PRILOSEC) 40 MG capsule TAKE 1 CAPSULE BY MOUTH EVERY DAY 90 capsule 1  . Potassium Chloride ER 20 MEQ TBCR TAKE 1 TABLET BY MOUTH EVERY DAY 90 tablet 1  . valACYclovir (VALTREX) 500 MG tablet Take 500 mg by mouth 2 (two) times daily.  0  . hydrochlorothiazide (MICROZIDE) 12.5 MG capsule Take 12.5 mg by mouth daily.    Marland Kitchen losartan (COZAAR) 100 MG tablet Take 100 mg by mouth daily.    Marland Kitchen glimepiride (AMARYL) 1 MG tablet Take 1 tablet (1 mg total) by mouth daily with breakfast. 30 tablet 3  . hydrOXYzine (ATARAX/VISTARIL) 10 MG tablet Take 10-30 mg by mouth at bedtime as needed.    Marland Kitchen ketoconazole (NIZORAL) 2 % cream 1 APPLICATION APPLY ON THE SKIN TWICE A DAY APPLY 2 WEEKS ON, 1 WEEK OFF. REPEAT AS NEEDED.    Marland Kitchen  losartan-hydrochlorothiazide (  HYZAAR) 100-12.5 MG tablet Take 1 tablet by mouth daily. 90 tablet 1  . meloxicam (MOBIC) 7.5 MG tablet Take 1 tablet (7.5 mg total) by mouth daily. 30 tablet 0  . PROTOPIC 0.1 % ointment Apply 1 a small amount to affected area twice a day  2 WEEKS ON, 2 WEEKS OFF    . Respiratory Therapy Supplies (FLUTTER) DEVI Use as directed 1 each 0  . tiZANidine (ZANAFLEX) 2 MG tablet Take 0.5-2 tablets (1-4 mg total) by mouth 2 (two) times daily as needed for muscle spasms. 40 tablet 1  . triamcinolone ointment (KENALOG) 0.1 % SMARTSIG:1 Sparingly Topical Twice Daily    . UNABLE TO FIND Apply to affected area twice a day 1 week on 1 week off prn flare    . Vitamin D, Ergocalciferol, (DRISDOL) 1.25 MG (50000 UNIT) CAPS capsule TAKE 1 CAPSULE (50,000 UNITS TOTAL) BY MOUTH EVERY 7 (SEVEN) DAYS. 12 capsule 0   No facility-administered medications prior to visit.    Allergies  Allergen Reactions  . Metformin And Related Diarrhea  . Penicillins Rash    Review of Systems  Constitutional: Negative for fever and malaise/fatigue.  HENT: Negative for congestion.   Eyes: Negative for blurred vision.  Respiratory: Negative for shortness of breath.   Cardiovascular: Negative for chest pain, palpitations and leg swelling.  Gastrointestinal: Negative for abdominal pain, blood in stool and nausea.  Genitourinary: Negative for dysuria and frequency.  Musculoskeletal: Positive for back pain, joint pain and myalgias. Negative for falls.  Skin: Positive for rash.  Neurological: Negative for dizziness, loss of consciousness and headaches.  Endo/Heme/Allergies: Negative for environmental allergies.  Psychiatric/Behavioral: Positive for depression. The patient is nervous/anxious.        Objective:    Physical Exam  BP 128/70 (BP Location: Right Arm, Cuff Size: Large)   Pulse 90   Temp 98.4 F (36.9 C) (Temporal)   Resp 12   Ht 5' (1.524 m)   Wt 156 lb 12.8 oz (71.1 kg)   SpO2  96%   BMI 30.62 kg/m  Wt Readings from Last 3 Encounters:  03/23/20 156 lb 12.8 oz (71.1 kg)  07/20/19 151 lb 9.6 oz (68.8 kg)  07/02/19 153 lb (69.4 kg)    Diabetic Foot Exam - Simple   No data filed     Lab Results  Component Value Date   WBC 6.5 03/23/2020   HGB 11.9 (L) 03/23/2020   HCT 35.9 (L) 03/23/2020   PLT 221.0 03/23/2020   GLUCOSE 135 (H) 03/23/2020   CHOL 160 03/23/2020   TRIG 111.0 03/23/2020   HDL 34.10 (L) 03/23/2020   LDLDIRECT 155.1 02/09/2013   LDLCALC 103 (H) 03/23/2020   ALT 15 03/23/2020   AST 18 03/23/2020   NA 141 03/23/2020   K 3.6 03/23/2020   CL 106 03/23/2020   CREATININE 0.71 03/23/2020   BUN 12 03/23/2020   CO2 29 03/23/2020   TSH 2.96 03/23/2020   HGBA1C 6.5 03/23/2020   MICROALBUR 1.6 08/08/2016    Lab Results  Component Value Date   TSH 2.96 03/23/2020   Lab Results  Component Value Date   WBC 6.5 03/23/2020   HGB 11.9 (L) 03/23/2020   HCT 35.9 (L) 03/23/2020   MCV 88.9 03/23/2020   PLT 221.0 03/23/2020   Lab Results  Component Value Date   NA 141 03/23/2020   K 3.6 03/23/2020   CO2 29 03/23/2020   GLUCOSE 135 (H) 03/23/2020   BUN 12 03/23/2020  CREATININE 0.71 03/23/2020   BILITOT 0.3 03/23/2020   ALKPHOS 57 03/23/2020   AST 18 03/23/2020   ALT 15 03/23/2020   PROT 6.7 03/23/2020   ALBUMIN 4.0 03/23/2020   CALCIUM 9.5 03/23/2020   ANIONGAP 7 10/14/2015   GFR 97.10 03/23/2020   Lab Results  Component Value Date   CHOL 160 03/23/2020   Lab Results  Component Value Date   HDL 34.10 (L) 03/23/2020   Lab Results  Component Value Date   LDLCALC 103 (H) 03/23/2020   Lab Results  Component Value Date   TRIG 111.0 03/23/2020   Lab Results  Component Value Date   CHOLHDL 5 03/23/2020   Lab Results  Component Value Date   HGBA1C 6.5 03/23/2020       Assessment & Plan:   Problem List Items Addressed This Visit    Type 2 diabetes, uncontrolled, with neuropathy ([place])    hgba1c acceptable, minimize  simple carbs. Increase exercise as tolerated. Continue current meds      Relevant Medications   losartan (COZAAR) 100 MG tablet   Other Relevant Orders   Hemoglobin A1c (Completed)   Hyperlipidemia associated with type 2 diabetes mellitus (H[facility])   Relevant Medications   losartan (COZAAR) 100 MG tablet   Other Relevant Orders   Lipid panel (Completed)   Essential hypertension    Well controlled, no changes to meds. Encouraged heart healthy diet such as the DASH diet and exercise as tolerated.       Relevant Medications   losartan (COZAAR) 100 MG tablet   hydrochlorothiazide (MICROZIDE) 12.5 MG capsule   furosemide (LASIX) 20 MG tablet   Other Relevant Orders   Comprehensive metabolic panel (Completed)   CBC (Completed)   TSH (Completed)   Uric acid (Completed)   Morbid obesity due to excess calories (HCC)   Cough   Anemia   Psoriasis    Has had several treatments with Humira but developed a rash and some stomach upset,side effects are improving and she will discuss with rheumatology      Acute gout   Relevant Orders   Uric acid (Completed)   Vitamin D deficiency - Primary    Supplement and monitor      Relevant Orders   VITAMIN D 25 Hydroxy (Vit-D Deficiency, Fractures) (Completed)   Pedal edema    Minimize sodium intake, elevate feet above heart tid. Compression hose on in am and off in pm. Given rx for Furosemide.          I have discontinued Heidi Franklin's Flutter, meloxicam, tiZANidine, ketoconazole, Vitamin D (Ergocalciferol), losartan-hydrochlorothiazide, UNABLE TO FIND, hydrOXYzine, Protopic, and triamcinolone ointment. I am also having her start on furosemide. Additionally, I am having her maintain her Multiple Vitamins-Minerals (CENTRUM SILVER PO), mometasone, acetaminophen, albuterol, valACYclovir, onetouch ultrasoft, glucose blood, omeprazole, ALPRAZolam, montelukast, Potassium Chloride ER, gabapentin, glimepiride, amLODipine, lovastatin, Humira Pen-Ps/UV/Adol  HS Start, betamethasone dipropionate, fluticasone, Vitamin D3, losartan, and hydrochlorothiazide.  Meds ordered this encounter  Medications  . furosemide (LASIX) 20 MG tablet    Sig: Take 1 tablet (20 mg total) by mouth daily as needed for edema.    Dispense:  30 tablet    Refill:  3     Penni Homans, MD

## 2020-03-27 NOTE — Assessment & Plan Note (Signed)
Has had several treatments with Humira but developed a rash and some stomach upset,side effects are improving and she will discuss with rheumatology

## 2020-03-27 NOTE — Assessment & Plan Note (Signed)
Minimize sodium intake, elevate feet above heart tid. Compression hose on in am and off in pm. Given rx for Furosemide.

## 2020-03-27 NOTE — Assessment & Plan Note (Signed)
Supplement and monitor 

## 2020-03-27 NOTE — Assessment & Plan Note (Signed)
hgba1c acceptable, minimize simple carbs. Increase exercise as tolerated. Continue current meds 

## 2020-03-28 ENCOUNTER — Other Ambulatory Visit: Payer: Self-pay | Admitting: Family Medicine

## 2020-03-28 DIAGNOSIS — J310 Chronic rhinitis: Secondary | ICD-10-CM

## 2020-03-28 DIAGNOSIS — R059 Cough, unspecified: Secondary | ICD-10-CM

## 2020-03-28 DIAGNOSIS — Z1231 Encounter for screening mammogram for malignant neoplasm of breast: Secondary | ICD-10-CM | POA: Diagnosis not present

## 2020-03-28 LAB — HM MAMMOGRAPHY

## 2020-03-30 ENCOUNTER — Encounter: Payer: Self-pay | Admitting: *Deleted

## 2020-03-31 DIAGNOSIS — L408 Other psoriasis: Secondary | ICD-10-CM | POA: Diagnosis not present

## 2020-04-14 ENCOUNTER — Telehealth (INDEPENDENT_AMBULATORY_CARE_PROVIDER_SITE_OTHER): Payer: Medicare Other | Admitting: Family Medicine

## 2020-04-14 ENCOUNTER — Other Ambulatory Visit: Payer: Self-pay

## 2020-04-14 DIAGNOSIS — E1140 Type 2 diabetes mellitus with diabetic neuropathy, unspecified: Secondary | ICD-10-CM

## 2020-04-14 DIAGNOSIS — I10 Essential (primary) hypertension: Secondary | ICD-10-CM | POA: Diagnosis not present

## 2020-04-14 DIAGNOSIS — R05 Cough: Secondary | ICD-10-CM | POA: Diagnosis not present

## 2020-04-14 DIAGNOSIS — K219 Gastro-esophageal reflux disease without esophagitis: Secondary | ICD-10-CM

## 2020-04-14 DIAGNOSIS — E1165 Type 2 diabetes mellitus with hyperglycemia: Secondary | ICD-10-CM

## 2020-04-14 DIAGNOSIS — IMO0002 Reserved for concepts with insufficient information to code with codable children: Secondary | ICD-10-CM

## 2020-04-14 DIAGNOSIS — R059 Cough, unspecified: Secondary | ICD-10-CM

## 2020-04-14 MED ORDER — METHYLPREDNISOLONE 4 MG PO TABS: ORAL_TABLET | ORAL | 0 refills | Status: DC

## 2020-04-14 MED ORDER — HYDROCODONE-HOMATROPINE 5-1.5 MG/5ML PO SYRP: 5.0000 mL | ORAL_SOLUTION | Freq: Two times a day (BID) | ORAL | 0 refills | Status: DC | PRN

## 2020-04-14 MED ORDER — DOXYCYCLINE HYCLATE 100 MG PO TABS
100.0000 mg | ORAL_TABLET | Freq: Two times a day (BID) | ORAL | 0 refills | Status: DC
Start: 2020-04-14 — End: 2020-06-09

## 2020-04-14 MED ORDER — BENZONATATE 100 MG PO CAPS
100.0000 mg | ORAL_CAPSULE | Freq: Two times a day (BID) | ORAL | 0 refills | Status: DC | PRN
Start: 2020-04-14 — End: 2021-09-07

## 2020-04-20 ENCOUNTER — Other Ambulatory Visit: Payer: Self-pay | Admitting: Family Medicine

## 2020-04-21 ENCOUNTER — Other Ambulatory Visit: Payer: Self-pay | Admitting: Family Medicine

## 2020-04-21 NOTE — Assessment & Plan Note (Signed)
Monitor and report any concerns. no changes to meds. Encouraged heart healthy diet such as the DASH diet and exercise as tolerated.  

## 2020-04-21 NOTE — Assessment & Plan Note (Signed)
Recurrent bronchitis has flared this week. Started on Doxycycline, Medrol dose pak, Tessalon Perles for daytime cough and Hydromet qhs. Encouraged increased rest and hydration, add probiotics, zinc such as Coldeze or Xicam. Treat fevers as needed

## 2020-04-21 NOTE — Telephone Encounter (Signed)
[facility]  ?

## 2020-04-21 NOTE — Assessment & Plan Note (Signed)
hgba1c acceptable, minimize simple carbs. Increase exercise as tolerated. Continue current meds. Minimize carbohydrates while taking meds and increase protein and vegetable intake

## 2020-04-21 NOTE — Progress Notes (Signed)
Virtual Visit via phone Note  I connected with Heidi Franklin on 04/14/20 at  8:40 AM EDT by a phone enabled telemedicine application and verified that I am speaking with the correct person using two identifiers.  Location: Patient: home, patient in visit Provider: home, provider in visit   I discussed the limitations of evaluation and management by telemedicine and the availability of in person appointments. The patient expressed understanding and agreed to proceed. CMA was able to get the patient set up on a phone visit after being unable to set up a video visit    Subjective:    Patient ID: Heidi Franklin, female    DOB: 08-15-1946, 74 y.o.   MRN: [id number]  No chief complaint on file.   HPI Patient is in today for evaluation of recurrent bronchitis. She had been doing well til earlier this week. She now has a worsening cough, sinus pressure, fatigue, malaise, sob, and over the counter meds are not helping her. Denies CP/palp/HA/fevers/GI or GU c/o. Taking meds as prescribed  Past Medical History:  Diagnosis Date  . ALLERGIC RHINITIS   . Allergy   . Anxiety state, unspecified   . Arthritis   . Asthma   . BRONCHITIS, CHRONIC   . Chronic low back pain 10/09/2016  . DEPRESSION   . DIABETES MELLITUS, TYPE II   . GERD   . HYPERLIPIDEMIA   . HYPERTENSION   . Psoriasis     Past Surgical History:  Procedure Laterality Date  . ABDOMINAL HYSTERECTOMY  1980  . TRIGGER FINGER RELEASE     Index finger    Family History  Problem Relation Age of Onset  . Stroke Mother   . Angina Mother   . Hyperlipidemia Mother   . Hypertension Mother   . Heart disease Mother   . Depression Mother   . Diabetes Father   . Hyperlipidemia Other        Parent  . Hypertension Other        Parent  . Diabetes Sister        x1  . CAD Sister   . COPD Sister        smoker  . Hyperlipidemia Sister   . Hypertension Sister   . Diabetes Brother        x 2  . Hyperlipidemia Brother   . Hypertension  Brother   . Diabetes Brother   . Hyperlipidemia Brother   . Hypertension Brother   . CAD Sister   . Arthritis Sister   . Hyperlipidemia Sister   . Hypertension Sister   . Colon cancer Neg Hx   . Esophageal cancer Neg Hx   . Rectal cancer Neg Hx   . Stomach cancer Neg Hx   . Pancreatic cancer Neg Hx     Social History   Socioeconomic History  . Marital status: Married    Spouse name: Not on file  . Number of children: 0  . Years of education: 52yr colge  . Highest education level: Not on file  Occupational History  . Occupation: Retired   Tobacco Use  . Smoking status: Never Smoker  . Smokeless tobacco: Never Used  Vaping Use  . Vaping Use: Never used  Substance and Sexual Activity  . Alcohol use: No    Alcohol/week: 0.0 standard drinks  . Drug use: No  . Sexual activity: Not Currently  Other Topics Concern  . Not on file  Social History Narrative   Married, lives with spouse-retired from  [employer] insurance   Not employed    Drinks coffee occasional, Consumes 1 soda a day    No dietary restrictions   Social Determinants of Radio broadcast assistant Strain:   . Difficulty of Paying Living Expenses:   Food Insecurity:   . Worried About Charity fundraiser in the Last Year:   . Arboriculturist in the Last Year:   Transportation Needs:   . Film/video editor (Medical):   Marland Kitchen Lack of Transportation (Non-Medical):   Physical Activity:   . Days of Exercise per Week:   . Minutes of Exercise per Session:   Stress:   . Feeling of Stress :   Social Connections:   . Frequency of Communication with Friends and Family:   . Frequency of Social Gatherings with Friends and Family:   . Attends Religious Services:   . Active Member of Clubs or Organizations:   . Attends Archivist Meetings:   Marland Kitchen Marital Status:   Intimate Partner Violence:   . Fear of Current or Ex-Partner:   . Emotionally Abused:   Marland Kitchen Physically Abused:   . Sexually Abused:     Outpatient  Medications Prior to Visit  Medication Sig Dispense Refill  . acetaminophen (TYLENOL) 325 MG tablet Take 650 mg by mouth every 6 (six) hours as needed.    Marland Kitchen albuterol (VENTOLIN HFA) 108 (90 Base) MCG/ACT inhaler INHALE 1-2 PUFFS INTO THE LUNGS EVERY 6 (SIX) HOURS AS NEEDED FOR WHEEZING OR SHORTNESS OF BREATH. 18 g 3  . amLODipine (NORVASC) 10 MG tablet TAKE 1 TABLET BY MOUTH EVERY DAY 90 tablet 1  . betamethasone dipropionate (DIPROLENE) 0.05 % ointment Apply to affected areas twice a day and then as needed flare/itch    . Cholecalciferol (VITAMIN D3) 50 MCG (2000 UT) capsule Take 2,000 Units by mouth daily.    . fluticasone (CUTIVATE) 0.05 % cream APPLY TO AFFECTED AREA TWICE A DAY 1 WEEK ON 1 WEEK OFF PRN FLARE    . furosemide (LASIX) 20 MG tablet Take 1 tablet (20 mg total) by mouth daily as needed for edema. 30 tablet 3  . gabapentin (NEURONTIN) 100 MG capsule TAKE 2 CAPSULES BY MOUTH AT BEDTIME. 180 capsule 2  . glimepiride (AMARYL) 1 MG tablet Take 1 tablet (1 mg total) by mouth daily with breakfast. 30 tablet 0  . glucose blood (ONE TOUCH ULTRA TEST) test strip 1 each by Other route daily. Use 1 strips to check blood sugar twice a day Ex E11.9 100 each 3  . HUMIRA PEN-PS/UV/ADOL HS START 40 MG/0.8ML PNKT     . hydrochlorothiazide (MICROZIDE) 12.5 MG capsule Take 12.5 mg by mouth daily.    . Lancets (ONETOUCH ULTRASOFT) lancets PRN 100 each 3  . losartan (COZAAR) 100 MG tablet Take 100 mg by mouth daily.    Marland Kitchen lovastatin (MEVACOR) 20 MG tablet Take 1 tablet (20 mg total) by mouth at bedtime. 90 tablet 1  . mometasone (ELOCON) 0.1 % ointment     . Multiple Vitamins-Minerals (CENTRUM SILVER PO) Take 1 tablet by mouth every morning.    Marland Kitchen omeprazole (PRILOSEC) 40 MG capsule TAKE 1 CAPSULE BY MOUTH EVERY DAY 90 capsule 1  . Potassium Chloride ER 20 MEQ TBCR TAKE 1 TABLET BY MOUTH EVERY DAY 90 tablet 1  . valACYclovir (VALTREX) 500 MG tablet Take 500 mg by mouth 2 (two) times daily.  0  .  ALPRAZolam (XANAX) 0.5 MG tablet Take 1 tablet  by mouth every 8 hours if needed for anxiety. 30 tablet 2  . montelukast (SINGULAIR) 10 MG tablet TAKE 1 TABLET BY MOUTH EVERYDAY AT BEDTIME 90 tablet 1   No facility-administered medications prior to visit.    Allergies  Allergen Reactions  . Metformin And Related Diarrhea  . Penicillins Rash    Review of Systems  Constitutional: Positive for malaise/fatigue. Negative for fever.  HENT: Positive for congestion and sinus pain.   Eyes: Negative for blurred vision.  Respiratory: Positive for cough, sputum production, shortness of breath and wheezing.   Cardiovascular: Negative for chest pain, palpitations and leg swelling.  Gastrointestinal: Negative for abdominal pain, blood in stool and nausea.  Genitourinary: Negative for dysuria and frequency.  Musculoskeletal: Positive for myalgias. Negative for falls.  Skin: Negative for rash.  Neurological: Negative for dizziness, loss of consciousness and headaches.  Endo/Heme/Allergies: Negative for environmental allergies.  Psychiatric/Behavioral: Negative for depression. The patient is not nervous/anxious.        Objective:    Physical Exam unable to obtain via phone  There were no vitals taken for this visit. Wt Readings from Last 3 Encounters:  03/23/20 156 lb 12.8 oz (71.1 kg)  07/20/19 151 lb 9.6 oz (68.8 kg)  07/02/19 153 lb (69.4 kg)    Diabetic Foot Exam - Simple   No data filed     Lab Results  Component Value Date   WBC 6.5 03/23/2020   HGB 11.9 (L) 03/23/2020   HCT 35.9 (L) 03/23/2020   PLT 221.0 03/23/2020   GLUCOSE 135 (H) 03/23/2020   CHOL 160 03/23/2020   TRIG 111.0 03/23/2020   HDL 34.10 (L) 03/23/2020   LDLDIRECT 155.1 02/09/2013   LDLCALC 103 (H) 03/23/2020   ALT 15 03/23/2020   AST 18 03/23/2020   NA 141 03/23/2020   K 3.6 03/23/2020   CL 106 03/23/2020   CREATININE 0.71 03/23/2020   BUN 12 03/23/2020   CO2 29 03/23/2020   TSH 2.96 03/23/2020    HGBA1C 6.5 03/23/2020   MICROALBUR 1.6 08/08/2016    Lab Results  Component Value Date   TSH 2.96 03/23/2020   Lab Results  Component Value Date   WBC 6.5 03/23/2020   HGB 11.9 (L) 03/23/2020   HCT 35.9 (L) 03/23/2020   MCV 88.9 03/23/2020   PLT 221.0 03/23/2020   Lab Results  Component Value Date   NA 141 03/23/2020   K 3.6 03/23/2020   CO2 29 03/23/2020   GLUCOSE 135 (H) 03/23/2020   BUN 12 03/23/2020   CREATININE 0.71 03/23/2020   BILITOT 0.3 03/23/2020   ALKPHOS 57 03/23/2020   AST 18 03/23/2020   ALT 15 03/23/2020   PROT 6.7 03/23/2020   ALBUMIN 4.0 03/23/2020   CALCIUM 9.5 03/23/2020   ANIONGAP 7 10/14/2015   GFR 97.10 03/23/2020   Lab Results  Component Value Date   CHOL 160 03/23/2020   Lab Results  Component Value Date   HDL 34.10 (L) 03/23/2020   Lab Results  Component Value Date   LDLCALC 103 (H) 03/23/2020   Lab Results  Component Value Date   TRIG 111.0 03/23/2020   Lab Results  Component Value Date   CHOLHDL 5 03/23/2020   Lab Results  Component Value Date   HGBA1C 6.5 03/23/2020       Assessment & Plan:   Problem List Items Addressed This Visit    Type 2 diabetes, uncontrolled, with neuropathy ([place])    hgba1c acceptable, minimize simple  carbs. Increase exercise as tolerated. Continue current meds. Minimize carbohydrates while taking meds and increase protein and vegetable intake      Essential hypertension    Monitor and report any concerns.  no changes to meds. Encouraged heart healthy diet such as the DASH diet and exercise as tolerated.       GERD    Avoid offending foods, start probiotics. Do not eat large meals in late evening and consider raising head of bed.       Cough    Recurrent bronchitis has flared this week. Started on Doxycycline, Medrol dose pak, Tessalon Perles for daytime cough and Hydromet qhs. Encouraged increased rest and hydration, add probiotics, zinc such as Coldeze or Xicam. Treat fevers as needed          I am having Heidi Franklin start on methylPREDNISolone, doxycycline, HYDROcodone-homatropine, and benzonatate. I am also having her maintain her Multiple Vitamins-Minerals (CENTRUM SILVER PO), mometasone, acetaminophen, valACYclovir, onetouch ultrasoft, glucose blood, omeprazole, Potassium Chloride ER, gabapentin, glimepiride, amLODipine, lovastatin, Humira Pen-Ps/UV/Adol HS Start, betamethasone dipropionate, fluticasone, Vitamin D3, losartan, hydrochlorothiazide, furosemide, and albuterol.  Meds ordered this encounter  Medications  . methylPREDNISolone (MEDROL) 4 MG tablet    Sig: 5 tab po qd X 1d then 4 tab po qd X 1d then 3 tab po qd X 1d then 2 tab po qd then 1 tab po qd    Dispense:  15 tablet    Refill:  0  . doxycycline (VIBRA-TABS) 100 MG tablet    Sig: Take 1 tablet (100 mg total) by mouth 2 (two) times daily.    Dispense:  20 tablet    Refill:  0  . HYDROcodone-homatropine (HYCODAN) 5-1.5 MG/5ML syrup    Sig: Take 5 mLs by mouth 2 (two) times daily as needed for cough.    Dispense:  120 mL    Refill:  0  . benzonatate (TESSALON) 100 MG capsule    Sig: Take 1 capsule (100 mg total) by mouth 2 (two) times daily as needed for cough.    Dispense:  20 capsule    Refill:  0     Penni Homans, MD   I discussed the assessment and treatment plan with the patient. The patient was provided an opportunity to ask questions and all were answered. The patient agreed with the plan and demonstrated an understanding of the instructions.   The patient was advised to call back or seek an in-person evaluation if the symptoms worsen or if the condition fails to improve as anticipated.  I provided 25 minutes of non-face-to-face time during this encounter.   Penni Homans, MD

## 2020-04-21 NOTE — Assessment & Plan Note (Signed)
Avoid offending foods, start probiotics. Do not eat large meals in late evening and consider raising head of bed.  

## 2020-05-04 ENCOUNTER — Telehealth: Payer: Self-pay | Admitting: *Deleted

## 2020-05-04 NOTE — Telephone Encounter (Signed)
Form from [employer] for genetic testing has been faxed back.

## 2020-05-10 ENCOUNTER — Telehealth: Payer: Self-pay | Admitting: Family Medicine

## 2020-05-10 NOTE — Telephone Encounter (Signed)
Left detailed message on machine that form was faxed on 7/8 and confirmation was received.

## 2020-05-10 NOTE — Telephone Encounter (Signed)
Caller: Jasneet Call back phone number: [phone]  Patient wants to know if you have ordered a cardiovascular test.

## 2020-05-11 ENCOUNTER — Encounter: Payer: Self-pay | Admitting: Family Medicine

## 2020-05-11 ENCOUNTER — Ambulatory Visit (INDEPENDENT_AMBULATORY_CARE_PROVIDER_SITE_OTHER): Payer: Medicare Other | Admitting: Family Medicine

## 2020-05-11 ENCOUNTER — Ambulatory Visit: Payer: Self-pay

## 2020-05-11 ENCOUNTER — Other Ambulatory Visit: Payer: Self-pay

## 2020-05-11 VITALS — BP 136/70 | HR 89 | Ht 60.0 in | Wt 157.0 lb

## 2020-05-11 DIAGNOSIS — M79642 Pain in left hand: Secondary | ICD-10-CM | POA: Diagnosis not present

## 2020-05-11 DIAGNOSIS — G5602 Carpal tunnel syndrome, left upper limb: Secondary | ICD-10-CM

## 2020-05-11 DIAGNOSIS — M79641 Pain in right hand: Secondary | ICD-10-CM | POA: Diagnosis not present

## 2020-05-11 DIAGNOSIS — M25649 Stiffness of unspecified hand, not elsewhere classified: Secondary | ICD-10-CM | POA: Diagnosis not present

## 2020-05-11 DIAGNOSIS — M65341 Trigger finger, right ring finger: Secondary | ICD-10-CM | POA: Diagnosis not present

## 2020-05-11 NOTE — Patient Instructions (Addendum)
Thank you for coming in today. Plan for wrist brace left hand at night to keep the wrist from having to flex down too much.   For the hand stiffness try using over the counter voltaren gel. Try also heat and some hand exercises.  Pinch and push modeling clay.  I will also refer to hand therapy. This will take a few weeks to get you an appointment with hand therapy. If you get before then ok to cancel.    For trigger finger also use voltaren gel and the double bandaid splint.   Recheck with me in about 6 weeks.  If not better we can do injections.

## 2020-05-11 NOTE — Progress Notes (Signed)
I, Kandace Blitz, LAT, ATC, am serving as scribe for Dr. Lynne Leader.  Heidi Franklin is a 74 y.o. female who presents to [place] at [facility] today for R hand pain.  She was last by Dr. Tamala Julian on 07/02/19 for low back pain and lumbar radiculopathy.  Since her last visit, pt reports that she has hand stiffness bilaterally and numbness in the left hand. Trigger finger in the right hand. States it is hard to open bottles. Right hand dominant.  R hand swelling: no  L hand numbness/tingling: Yes  Aggravating factors: opening bottles  Treatments tried: None    Pertinent review of systems: No fevers or chills  Relevant historical information: Hypertension, diabetes   Exam:  BP 136/70 (BP Location: Left Arm, Patient Position: Sitting)   Pulse 89   Ht 5' (1.524 m)   Wt 157 lb (71.2 kg)   SpO2 98%   BMI 30.66 kg/m  General: Well Developed, well nourished, and in no acute distress.   MSK: Right hand: Enlarged MCP and PIP throughout. No ulnar deviation. Tender palpation on dorsal aspect of hand. Mildly tender palpation fourth MCP.  No triggering present today.  However patient does have some pain felt in PCP with PIP flexion. Intact strength.  Left hand and wrist similar in appearance to right with enlarged MCP and PIP.  No ulnar deviation. Not particularly tender palpation. Negative Tinel's at carpal tunnel. Mildly positive Phalen's test.    Lab and Radiology Results  Diagnostic Limited MSK Ultrasound of: Bilateral hands Right fourth MCP volar.  Flexor tendon is intact normal-appearing Increased hypoechoic fluid and tendon sheath indicates trigger finger.  Thickened A1 pulley present as well. Dorsal MCPs throughout show some degenerative changes and minimal synovitis. Left hand MCPs also show degenerative changes and minimal synovitis. Left wrist carpal tunnel enlarged median nerve. Impression: Left fourth digit trigger finger.  Degenerative joints MCP and  carpal tunnel syndrome left.     Assessment and Plan: 74 y.o. female with right fourth digit trigger finger.  Fortunately ongoing only for about 2 weeks.  Patient would like to avoid injection which I think is reasonable.  Plan for Voltaren gel and double Band-Aid splint.  Also will be referring to hand therapy especially for the hand stiffness this may help as well.  Hand stiffness bilaterally.  Due to DJD.  Doubtful for rheumatoid arthritis.  Again Voltaren gel and occupational therapy.  Carpal tunnel syndrome left wrist.  Plan for cock-up wrist splint at night and hand therapy.  Recheck in about 6 weeks.  Return sooner if needed.   PDMP not reviewed this encounter. Orders Placed This Encounter  Procedures  . [place] LIMITED JOINT SPACE STRUCTURES LOW RIGHT(NO LINKED CHARGES)    Order Specific Question:   Reason for Exam (SYMPTOM  OR DIAGNOSIS REQUIRED)    Answer:   bilateral hand pain    Order Specific Question:   Preferred imaging location?    Answer:   Internal  . Ambulatory referral to Occupational Therapy    Referral Priority:   Routine    Referral Type:   Occupational Therapy    Referral Reason:   Specialty Services Required    Requested Specialty:   Occupational Therapy    Number of Visits Requested:   1   No orders of the defined types were placed in this encounter.    Discussed warning signs or symptoms. Please see discharge instructions. Patient expresses understanding.   The above documentation has been  reviewed and is accurate and complete Lynne Leader, M.D.

## 2020-05-12 DIAGNOSIS — L400 Psoriasis vulgaris: Secondary | ICD-10-CM | POA: Diagnosis not present

## 2020-05-26 DIAGNOSIS — L400 Psoriasis vulgaris: Secondary | ICD-10-CM | POA: Diagnosis not present

## 2020-06-09 ENCOUNTER — Encounter: Payer: Self-pay | Admitting: Family Medicine

## 2020-06-09 ENCOUNTER — Other Ambulatory Visit: Payer: Self-pay

## 2020-06-09 ENCOUNTER — Ambulatory Visit (INDEPENDENT_AMBULATORY_CARE_PROVIDER_SITE_OTHER): Payer: Medicare Other | Admitting: Family Medicine

## 2020-06-09 VITALS — BP 120/70 | HR 97 | Temp 99.2°F | Resp 18 | Ht 60.0 in | Wt 159.4 lb

## 2020-06-09 DIAGNOSIS — L400 Psoriasis vulgaris: Secondary | ICD-10-CM | POA: Diagnosis not present

## 2020-06-09 DIAGNOSIS — R1013 Epigastric pain: Secondary | ICD-10-CM

## 2020-06-09 LAB — COMPREHENSIVE METABOLIC PANEL
AG Ratio: 1.4 (calc) (ref 1.0–2.5)
ALT: 15 U/L (ref 6–29)
AST: 16 U/L (ref 10–35)
Albumin: 4.1 g/dL (ref 3.6–5.1)
Alkaline phosphatase (APISO): 60 U/L (ref 37–153)
BUN/Creatinine Ratio: 20 (calc) (ref 6–22)
BUN: 21 mg/dL (ref 7–25)
CO2: 29 mmol/L (ref 20–32)
Calcium: 9.9 mg/dL (ref 8.6–10.4)
Chloride: 104 mmol/L (ref 98–110)
Creat: 1.05 mg/dL — ABNORMAL HIGH (ref 0.60–0.93)
Globulin: 2.9 g/dL (calc) (ref 1.9–3.7)
Glucose, Bld: 64 mg/dL — ABNORMAL LOW (ref 65–99)
Potassium: 3.6 mmol/L (ref 3.5–5.3)
Sodium: 141 mmol/L (ref 135–146)
Total Bilirubin: 0.3 mg/dL (ref 0.2–1.2)
Total Protein: 7 g/dL (ref 6.1–8.1)

## 2020-06-09 LAB — CBC WITH DIFFERENTIAL/PLATELET
Absolute Monocytes: 656 cells/uL (ref 200–950)
Basophils Absolute: 33 cells/uL (ref 0–200)
Basophils Relative: 0.4 %
Eosinophils Absolute: 139 cells/uL (ref 15–500)
Eosinophils Relative: 1.7 %
HCT: 35.9 % (ref 35.0–45.0)
Hemoglobin: 12 g/dL (ref 11.7–15.5)
Lymphs Abs: 3510 cells/uL (ref 850–3900)
MCH: 29.6 pg (ref 27.0–33.0)
MCHC: 33.4 g/dL (ref 32.0–36.0)
MCV: 88.4 fL (ref 80.0–100.0)
MPV: 9.4 fL (ref 7.5–12.5)
Monocytes Relative: 8 %
Neutro Abs: 3862 cells/uL (ref 1500–7800)
Neutrophils Relative %: 47.1 %
Platelets: 242 10*3/uL (ref 140–400)
RBC: 4.06 10*6/uL (ref 3.80–5.10)
RDW: 13.5 % (ref 11.0–15.0)
Total Lymphocyte: 42.8 %
WBC: 8.2 10*3/uL (ref 3.8–10.8)

## 2020-06-09 MED ORDER — PANTOPRAZOLE SODIUM 40 MG PO TBEC: 40.0000 mg | DELAYED_RELEASE_TABLET | Freq: Every day | ORAL | 3 refills | Status: DC

## 2020-06-09 NOTE — Progress Notes (Signed)
Patient ID: Heidi Franklin, female    DOB: 02/26/1946  Age: 74 y.o. MRN: [id number]    Subjective:  Subjective  HPI Becci Batty presents for gerd symptoms that are worsening -- she is taking omeprazole 40 mg daily and pepto with no relief.  She has trouble with every meal   Review of Systems  Constitutional: Negative for appetite change, diaphoresis, fatigue and unexpected weight change.  Eyes: Negative for pain, redness and visual disturbance.  Respiratory: Negative for cough, chest tightness, shortness of breath and wheezing.   Cardiovascular: Negative for chest pain, palpitations and leg swelling.  Gastrointestinal: Positive for abdominal pain. Negative for constipation, diarrhea, nausea and vomiting.  Endocrine: Negative for cold intolerance, heat intolerance, polydipsia, polyphagia and polyuria.  Genitourinary: Negative for difficulty urinating, dysuria and frequency.  Neurological: Negative for dizziness, light-headedness, numbness and headaches.    History Past Medical History:  Diagnosis Date  . ALLERGIC RHINITIS   . Allergy   . Anxiety state, unspecified   . Arthritis   . Asthma   . BRONCHITIS, CHRONIC   . Chronic low back pain 10/09/2016  . DEPRESSION   . DIABETES MELLITUS, TYPE II   . GERD   . HYPERLIPIDEMIA   . HYPERTENSION   . Psoriasis     She has a past surgical history that includes Abdominal hysterectomy (1980) and Trigger finger release.   Her family history includes Angina in her mother; Arthritis in her sister; CAD in her sister and sister; COPD in her sister; Depression in her mother; Diabetes in her brother, brother, father, and sister; Heart disease in her mother; Hyperlipidemia in her brother, brother, mother, sister, sister, and another family member; Hypertension in her brother, brother, mother, sister, sister, and another family member; Stroke in her mother.She reports that she has never smoked. She has never used smokeless tobacco. She reports that she  does not drink alcohol and does not use drugs.     Objective:  Objective  Physical Exam Vitals and nursing note reviewed.  Constitutional:      Appearance: She is well-developed.  HENT:     Head: Normocephalic and atraumatic.  Eyes:     Conjunctiva/sclera: Conjunctivae normal.  Neck:     Thyroid: No thyromegaly.     Vascular: No carotid bruit or JVD.  Cardiovascular:     Rate and Rhythm: Normal rate and regular rhythm.     Heart sounds: Normal heart sounds. No murmur heard.   Pulmonary:     Effort: Pulmonary effort is normal. No respiratory distress.     Breath sounds: Normal breath sounds. No wheezing or rales.  Chest:     Chest wall: No tenderness.  Abdominal:     Tenderness: There is abdominal tenderness in the epigastric area. There is no guarding or rebound.  Musculoskeletal:     Cervical back: Normal range of motion and neck supple.  Neurological:     Mental Status: She is alert and oriented to person, place, and time.    BP 120/70 (BP Location: Left Arm, Patient Position: Sitting, Cuff Size: Normal)   Pulse 97   Temp 99.2 F (37.3 C) (Oral)   Resp 18   Ht 5' (1.524 m)   Wt 159 lb 6.4 oz (72.3 kg)   SpO2 99%   BMI 31.13 kg/m  Wt Readings from Last 3 Encounters:  06/09/20 159 lb 6.4 oz (72.3 kg)  05/11/20 157 lb (71.2 kg)  03/23/20 156 lb 12.8 oz (71.1 kg)  Lab Results  Component Value Date   WBC 6.5 03/23/2020   HGB 11.9 (L) 03/23/2020   HCT 35.9 (L) 03/23/2020   PLT 221.0 03/23/2020   GLUCOSE 135 (H) 03/23/2020   CHOL 160 03/23/2020   TRIG 111.0 03/23/2020   HDL 34.10 (L) 03/23/2020   LDLDIRECT 155.1 02/09/2013   LDLCALC 103 (H) 03/23/2020   ALT 15 03/23/2020   AST 18 03/23/2020   NA 141 03/23/2020   K 3.6 03/23/2020   CL 106 03/23/2020   CREATININE 0.71 03/23/2020   BUN 12 03/23/2020   CO2 29 03/23/2020   TSH 2.96 03/23/2020   HGBA1C 6.5 03/23/2020   MICROALBUR 1.6 08/08/2016    DG INJECT DIAG/THERA/INC NEEDLE/CATH/PLC  EPI/LUMB/SAC W/IMG  Result Date: 08/19/2019 CLINICAL DATA:  Lumbosacral spondylosis without myelopathy. Left greater than right back pain. No relief after left L3-L4 facet injection in July. L3-L4 epidural injection requested. FLUOROSCOPY TIME:  Radiation Exposure Index (as provided by the fluoroscopic device): 5.7 mGy Fluoroscopy Time:  16 seconds Number of Acquired Images:  0 PROCEDURE: The procedure, risks, benefits, and alternatives were explained to the patient. Questions regarding the procedure were encouraged and answered. The patient understands and consents to the procedure. LUMBAR EPIDURAL INJECTION: An interlaminar approach was performed on the left at L3-L4. The overlying skin was cleansed and anesthetized. A 3.5 inch 20 gauge epidural needle was advanced using loss-of-resistance technique. DIAGNOSTIC EPIDURAL INJECTION: Injection of Isovue-M 200 shows a good epidural pattern with spread above and below the level of needle placement, primarily on the left. No vascular opacification is seen. THERAPEUTIC EPIDURAL INJECTION: 120 mg of Depo-Medrol mixed with 3 mL of 1% lidocaine were instilled. The procedure was well-tolerated, and the patient was discharged thirty minutes following the injection in good condition. COMPLICATIONS: None immediate. IMPRESSION: Technically successful interlaminar epidural injection on the left at L3-L4. Electronically Signed   By: Titus Dubin M.D.   On: 08/19/2019 12:35     Assessment & Plan:  Plan  I have discontinued Vernesha Dawn's omeprazole and doxycycline. I am also having her start on pantoprazole. Additionally, I am having her maintain her Multiple Vitamins-Minerals (CENTRUM SILVER PO), mometasone, acetaminophen, valACYclovir, onetouch ultrasoft, glucose blood, Potassium Chloride ER, gabapentin, glimepiride, amLODipine, lovastatin, Humira Pen-Ps/UV/Adol HS Start, betamethasone dipropionate, fluticasone, Vitamin D3, losartan, hydrochlorothiazide, furosemide,  albuterol, methylPREDNISolone, HYDROcodone-homatropine, benzonatate, montelukast, and ALPRAZolam.  Meds ordered this encounter  Medications  . pantoprazole (PROTONIX) 40 MG tablet    Sig: Take 1 tablet (40 mg total) by mouth daily.    Dispense:  30 tablet    Refill:  3    Problem List Items Addressed This Visit    None    Visit Diagnoses    Dyspepsia    -  Primary   Relevant Medications   pantoprazole (PROTONIX) 40 MG tablet   Other Relevant Orders   Ambulatory referral to Gastroenterology   CBC with Differential/Platelet   Comprehensive metabolic panel   H. pylori antibody, IgG    change med to protonix Check labs and refer back to GI Pt given ho on diet for gerd   Follow-up: Return if symptoms worsen or fail to improve.  Ann Held, DO

## 2020-06-09 NOTE — Patient Instructions (Signed)

## 2020-06-12 LAB — H. PYLORI ANTIBODY, IGG: H. pylori, IgG AbS: 0.16 Index Value (ref 0.00–0.79)

## 2020-06-15 ENCOUNTER — Other Ambulatory Visit: Payer: Self-pay | Admitting: Family Medicine

## 2020-06-19 DIAGNOSIS — L400 Psoriasis vulgaris: Secondary | ICD-10-CM | POA: Diagnosis not present

## 2020-06-20 ENCOUNTER — Telehealth: Payer: Self-pay

## 2020-06-20 NOTE — Telephone Encounter (Signed)
Patient called stating she received a "generic cardiovascular packet" delivered by [employer]. She has no idea what this is or what do to with it. Could you please call patient back to discuss?   I saw the encounter from 05/10/2020 however it did not explain why she is needing this.

## 2020-06-21 NOTE — Telephone Encounter (Signed)
Left message on machine to call back  Not sure what this is.  Need more info.

## 2020-06-21 NOTE — Telephone Encounter (Signed)
Called patient and she stated that she did not order this test but she wanted to know if Dr. Charlett Blake would like for her to do it.  I printed order and will ask Dr. Charlett Blake about it tomorrow.

## 2020-06-22 ENCOUNTER — Encounter: Payer: Self-pay | Admitting: Family Medicine

## 2020-06-22 ENCOUNTER — Ambulatory Visit (INDEPENDENT_AMBULATORY_CARE_PROVIDER_SITE_OTHER): Payer: Medicare Other | Admitting: Family Medicine

## 2020-06-22 ENCOUNTER — Other Ambulatory Visit: Payer: Self-pay

## 2020-06-22 ENCOUNTER — Ambulatory Visit: Payer: Self-pay

## 2020-06-22 VITALS — BP 114/72 | HR 93 | Ht 60.0 in | Wt 158.6 lb

## 2020-06-22 DIAGNOSIS — M79642 Pain in left hand: Secondary | ICD-10-CM

## 2020-06-22 DIAGNOSIS — G5602 Carpal tunnel syndrome, left upper limb: Secondary | ICD-10-CM | POA: Diagnosis not present

## 2020-06-22 DIAGNOSIS — M65341 Trigger finger, right ring finger: Secondary | ICD-10-CM | POA: Diagnosis not present

## 2020-06-22 DIAGNOSIS — M79641 Pain in right hand: Secondary | ICD-10-CM

## 2020-06-22 DIAGNOSIS — M65351 Trigger finger, right little finger: Secondary | ICD-10-CM | POA: Diagnosis not present

## 2020-06-22 NOTE — Telephone Encounter (Signed)
Per Dr. Charlett Blake she don't have to do the test but if she like to she can.  Patient advised of note.

## 2020-06-22 NOTE — Addendum Note (Signed)
Addended by: Pollyann Glen on: 06/22/2020 01:34 PM   Modules accepted: Orders

## 2020-06-22 NOTE — Progress Notes (Signed)
I, Wendy Poet, LAT, ATC, am serving as scribe for Dr. Lynne Leader.  Heidi Franklin is a 74 y.o. female who presents to [facility] at [facility] today for f/u of R 4th finger trigger finger and L wrist/hand pain and numbness/tingling.  She was last seen by Dr. Georgina Snell on 05/11/20 and was referred to OT and advised to use Voltaren gel and a double band-aid splint on her R 4th finger.  She was also advised to wear a L wrist brace/splint at night.  Since her last visit, pt reports that her R hand remains unchanged and notes triggering in her R 4th and 5th fingers.  Her L wrist remains unchanged.  She has been wearing her L wrist brace/splint at night.  She has pain and numbness/tingling in her L fingers 1-3.   Pertinent review of systems: No fevers or chills  Relevant historical information: Diabetes   Exam:  BP 114/72 (BP Location: Right Arm, Patient Position: Sitting, Cuff Size: Normal)   Pulse 93   Ht 5' (1.524 m)   Wt 158 lb 9.6 oz (71.9 kg)   SpO2 98%   BMI 30.97 kg/m  General: Well Developed, well nourished, and in no acute distress.   MSK: Right hand normal-appearing Tender palpation palmar MCP fourth and fifth digit mildly third digit.  Triggering present fifth digit.  Left wrist normal-appearing normal motion.  Positive Tinel's and Phalen's test carpal tunnel    Lab and Radiology Results Procedure: Real-time Ultrasound Guided Injection of right fifth MCP A1 pulley (trigger finger) Device: Philips Affiniti 50G Images permanently stored and available for review in PACS Verbal informed consent obtained.  Discussed risks and benefits of procedure. Warned about infection bleeding damage to structures skin hypopigmentation and fat atrophy among others. Patient expresses understanding and agreement Time-out conducted.   Noted no overlying erythema, induration, or other signs of local infection.   Skin prepped in a sterile fashion.   Local anesthesia: Topical Ethyl  chloride.   With sterile technique and under real time ultrasound guidance:  40 mg of Depo-Medrol and 0.5 mL of lidocaine injected easily.   Completed without difficulty   Pain immediately resolved suggesting accurate placement of the medication.   Advised to call if fevers/chills, erythema, induration, drainage, or persistent bleeding.   Images permanently stored and available for review in the ultrasound unit.  Impression: Technically successful ultrasound guided injection.      Assessment and Plan: 74 y.o. female with right hand trigger finger worse at fifth digit.  Somewhat symptomatic fourth and third digits.  Plan for injection today as she is simply failing conservative management.  At the last visit plan for occupational therapy but patient never did attend occupational therapy.  Patient is willing to give hand therapy to try.  New referral to hand therapy ordered today.  Recheck back as needed for subsequent injection other digits.  Carpal tunnel syndrome left.  Improving with cock-up wrist splint.  Again trial of hand therapy.  Recheck as needed if not improving proceed with injection at that time.   Orders Placed This Encounter  Procedures  . [place] LIMITED JOINT SPACE STRUCTURES UP BILAT(NO LINKED CHARGES)    Order Specific Question:   Reason for Exam (SYMPTOM  OR DIAGNOSIS REQUIRED)    Answer:   B hand pain    Order Specific Question:   Preferred imaging location?    Answer:   [facility]   No orders of the defined types were  placed in this encounter.    Discussed warning signs or symptoms. Please see discharge instructions. Patient expresses understanding.   The above documentation has been reviewed and is accurate and complete Lynne Leader, M.D.

## 2020-06-22 NOTE — Patient Instructions (Signed)
Thank you for coming in today. Call or go to the ER if you develop a large red swollen joint with extreme pain or oozing puss.  Return for other injection if needed.  Take gabapentin for nerve pain or numbness or tingling mostly at night.  I will follow up with what happened about hand therapy.

## 2020-06-23 DIAGNOSIS — L400 Psoriasis vulgaris: Secondary | ICD-10-CM | POA: Diagnosis not present

## 2020-06-26 DIAGNOSIS — E7800 Pure hypercholesterolemia, unspecified: Secondary | ICD-10-CM | POA: Diagnosis not present

## 2020-06-26 DIAGNOSIS — I429 Cardiomyopathy, unspecified: Secondary | ICD-10-CM | POA: Diagnosis not present

## 2020-06-26 DIAGNOSIS — R0602 Shortness of breath: Secondary | ICD-10-CM | POA: Diagnosis not present

## 2020-06-26 DIAGNOSIS — I498 Other specified cardiac arrhythmias: Secondary | ICD-10-CM | POA: Diagnosis not present

## 2020-06-26 DIAGNOSIS — I10 Essential (primary) hypertension: Secondary | ICD-10-CM | POA: Diagnosis not present

## 2020-06-26 DIAGNOSIS — Z723 Lack of physical exercise: Secondary | ICD-10-CM | POA: Diagnosis not present

## 2020-06-26 DIAGNOSIS — E1340 Other specified diabetes mellitus with diabetic neuropathy, unspecified: Secondary | ICD-10-CM | POA: Diagnosis not present

## 2020-07-01 ENCOUNTER — Other Ambulatory Visit: Payer: Self-pay | Admitting: Family Medicine

## 2020-07-04 ENCOUNTER — Telehealth: Payer: Self-pay

## 2020-07-04 ENCOUNTER — Other Ambulatory Visit: Payer: Self-pay

## 2020-07-04 ENCOUNTER — Ambulatory Visit: Payer: Medicare Other | Attending: Family Medicine | Admitting: Occupational Therapy

## 2020-07-04 DIAGNOSIS — M6281 Muscle weakness (generalized): Secondary | ICD-10-CM

## 2020-07-04 DIAGNOSIS — M79642 Pain in left hand: Secondary | ICD-10-CM | POA: Diagnosis not present

## 2020-07-04 DIAGNOSIS — R209 Unspecified disturbances of skin sensation: Secondary | ICD-10-CM | POA: Diagnosis not present

## 2020-07-04 DIAGNOSIS — R208 Other disturbances of skin sensation: Secondary | ICD-10-CM

## 2020-07-04 DIAGNOSIS — M79641 Pain in right hand: Secondary | ICD-10-CM | POA: Diagnosis not present

## 2020-07-04 NOTE — Therapy (Signed)
[facility] [address] [place], [place], [address] Phone: [phone]   Fax:  [phone]  Occupational Therapy Evaluation  Patient Details  Name: Heidi Franklin MRN: [id number] Date of Birth: [birth year] Referring Provider (OT): Dr. Georgina Snell   Encounter Date: 07/04/2020   OT End of Session - 07/04/20 1008    Visit Number 1    Number of Visits 17    Date for OT Re-Evaluation 09/02/20    Authorization Type MCR    Authorization - Visit Number 1    Authorization - Number of Visits 10    Progress Note Due on Visit 10    OT Start Time 0915    OT Stop Time 1000    OT Time Calculation (min) 45 min    Activity Tolerance Patient tolerated treatment well    Behavior During Therapy [facility] for tasks assessed/performed           Past Medical History:  Diagnosis Date  . ALLERGIC RHINITIS   . Allergy   . Anxiety state, unspecified   . Arthritis   . Asthma   . BRONCHITIS, CHRONIC   . Chronic low back pain 10/09/2016  . DEPRESSION   . DIABETES MELLITUS, TYPE II   . GERD   . HYPERLIPIDEMIA   . HYPERTENSION   . Psoriasis     Past Surgical History:  Procedure Laterality Date  . ABDOMINAL HYSTERECTOMY  1980  . TRIGGER FINGER RELEASE     Index finger    There were no vitals filed for this visit.   Subjective Assessment - 07/04/20 0918    Subjective  I really started having problems in June after my sister-n-law passed, but I had been taking care of her and that aggravated my hands    Pertinent History Lt CTS, RT trigger finger of 4th and 5th digits. PMH: OA, DM, HTN, HLD    Currently in Pain? Yes    Pain Score 7    in Rt hand, Lt hand pain is sporadically and not as severe 5/10   Pain Location Hand    Pain Orientation Right;Left    Pain Descriptors / Indicators Sharp;Tender    Pain Type Chronic pain    Pain Onset More than a month ago    Pain Frequency Constant   in Rt, intermittent in Lt   Aggravating Factors  use     Pain Relieving Factors brace for Lt wrist at night             [facility] OT Assessment - 07/04/20 0001      Assessment   Medical Diagnosis Bilateral hand pain, Lt CTS, Trigger finger Rt ring and Rt little finger    Referring Provider (OT) Dr. Georgina Snell    Onset Date/Surgical Date --   June 2021   Hand Dominance Right      Precautions   Precautions None      Balance Screen   Has the patient fallen in the past 6 months Yes    How many times? 1   fell out of bed   Has the patient had a decrease in activity level because of a fear of falling?  No    Is the patient reluctant to leave their home because of a fear of falling?  No      Home  Environment   Lives With Spouse      Prior Function   Level of Independence Independent    Vocation Retired  Leisure decorating, read, puzzles      ADL   Eating/Feeding Independent    Grooming Independent    Upper Body Bathing Independent    Lower Body Bathing Independent    Upper Body Dressing Independent    Lower Body Dressing Independent    Toilet Transfer Independent    Toileting - Social research officer, government -  Product/process development scientist Independent      IADL   Shopping Needs to be accompanied on any shopping trip   husband assist w/ 24 pk water   Light Housekeeping Performs light daily tasks such as dishwashing, bed making;Does personal laundry completely   husband vacuums   Meal Prep Plans, prepares and serves adequate meals independently   husband helps to open tight jars, and some chopping   Programmer, applications own vehicle    Medication Management Takes responsibility if medication is prepared in advance in seperate dosage      Mobility   Mobility Status Independent      Written Expression   Dominant Hand Right    Handwriting --   pt reports hurts but does not affect handwriting     Vision - History   Baseline Vision Wears glasses only for reading      Observation/Other Assessments    Observations previous Rt CTR Surgery      Sensation   Additional Comments numbness Lt first 3 fingers consistent with CTS, Pt was positive for Tinel's and Phalen's at MD office on 06/22/20      Coordination   9 Hole Peg Test Right;Left    Right 9 Hole Peg Test 26.06 sec    Left 9 Hole Peg Test 30.69 sec      Edema   Edema pt has joint swelling Rt hand at IP joints d/t arthritis, pt may have mild swelling at base of 4th and 5th digits      ROM / Strength   AROM / PROM / Strength AROM      Palpation   Palpation comment tender at base of Rt 4th and 5th fingers      AROM   Overall AROM Comments BUE AROM WFL's except Rt hand approx 90% full composite flexion (partly d/t arthritis and partly d/t pain)      Hand Function   Right Hand Grip (lbs) 26    Right Hand Lateral Pinch 10 lbs    Right Hand 3 Point Pinch 6 lbs    Left Hand Grip (lbs) 22     Left Hand Lateral Pinch 7 lbs    Left 3 point pinch 4 lbs                             OT Short Term Goals - 07/04/20 1017      OT SHORT TERM GOAL #1   Title Independent with pain management strategies including splinting, modalities, task modifications, and potential A/E needs for both hands    Time 4    Period Weeks    Status New      OT SHORT TERM GOAL #2   Title Pt to report less pain bilateral hands with functional tasks    Time 4    Period Weeks    Status New      OT SHORT TERM GOAL #3   Title Pt to verbalize understanding with splint wear and care prn and A/E to make opening jars/containers  easier    Time 4    Period Weeks    Status New      OT SHORT TERM GOAL #4   Title Independent with HEP for Lt CTS including median n. gliding ex's and for Rt hand ROM ex's    Time 4    Period Weeks    Status New             OT Long Term Goals - 07/04/20 1016      OT LONG TERM GOAL #1   Title Pt to verbalize understanding with strengthening HEP    Time 8    Period Weeks    Status New      OT LONG TERM  GOAL #2   Title Grip strength to improve bilaterally by 10 lbs or greater    Baseline Rt = 26 lbs, Lt = 22 lbs    Time 8    Period Weeks    Status New      OT LONG TERM GOAL #3   Title Pt to improve 3 tip pinch by 3 lbs bilaterally    Baseline Rt = 6, Lt = 4 lbs.    Time 8    Period Weeks    Status New                 Plan - 07/04/20 1009    Clinical Impression Statement Pt is a 74 y.o. female who presents to [facility] for bilateral hand pain including CTS Lt wrist/hand and trigger finger to Rt ring and small fingers. Pt would benefit from O.T. to address pain, motion, strength, and train in activity modifications and A/E needs prn.    OT Occupational Profile and History Problem Focused Assessment - Including review of records relating to presenting problem    Occupational performance deficits (Please refer to evaluation for details): IADL's;Leisure    Body Structure / Function / Physical Skills ADL;ROM;IADL;Edema;Sensation;Strength;Flexibility;Coordination;UE functional use;Pain    Rehab Potential Good    Clinical Decision Making Limited treatment options, no task modification necessary    Comorbidities Affecting Occupational Performance: None    Modification or Assistance to Complete Evaluation  No modification of tasks or assist necessary to complete eval    OT Frequency 2x / week    OT Duration 8 weeks   may d/c earlier depending on progress   OT Treatment/Interventions Self-care/ADL training;Therapeutic exercise;Ultrasound;Manual Therapy;Splinting;Iontophoresis;Therapeutic activities;Paraffin;Cryotherapy;DME and/or AE instruction;Compression bandaging;Fluidtherapy;Electrical Stimulation;Scar mobilization;Moist Heat;Passive range of motion;Patient/family education;Contrast Bath    Plan pt to bring in pm splint for Lt CTS to assess, issue CTS handout for Lt hand, possible Rt ring trigger finger splint, ? ionto for base of Rt ring if cleared by MD    Consulted and Agree with Plan of  Care Patient           Patient will benefit from skilled therapeutic intervention in order to improve the following deficits and impairments:   Body Structure / Function / Physical Skills: ADL, ROM, IADL, Edema, Sensation, Strength, Flexibility, Coordination, UE functional use, Pain       Visit Diagnosis: Pain in right hand - Plan: Ot plan of care cert/re-cert  Pain in left hand - Plan: Ot plan of care cert/re-cert  Muscle weakness (generalized) - Plan: Ot plan of care cert/re-cert  Other disturbances of skin sensation - Plan: Ot plan of care cert/re-cert    Problem List Patient Active Problem List   Diagnosis Date Noted  . Pedal edema 03/27/2020  . Toe ulcer ([place])  03/10/2020  . History of chicken pox 04/11/2019  . Vitamin D deficiency 04/05/2019  . Lumbar radiculopathy 03/31/2019  . Wheezing 02/25/2019  . Neck pain 07/06/2018  . Trapezius muscle strain, right, initial encounter 06/23/2018  . Edema 06/23/2018  . Acute gout 04/08/2018  . Herpes simplex 02/10/2018  . ETD (eustachian tube dysfunction) 02/10/2018  . Peroneal tendinitis, right 01/06/2018  . Psoriasis 11/14/2017  . Acute bursitis of right shoulder 09/29/2017  . Bilateral hip pain 07/10/2017  . Bronchiectasis without complication ([place]) 28/41/3244  . Irritable larynx syndrome 04/25/2017  . Reflux laryngitis 04/23/2017  . Benign lipomatous neoplasm of skin and subcutaneous tissue of right leg 02/25/2017  . Anemia 02/05/2017  . Fatigue 01/23/2017  . Cough 01/02/2017  . Multiple pulmonary nodules 11/10/2016  . Chronic low back pain 10/09/2016  . Degenerative arthritis of right knee 06/20/2016  . Degenerative arthritis of left knee 04/10/2016  . Bronchitis 12/15/2015  . Peroneal tendinitis of left lower extremity 09/05/2015  . Recurrent falls 08/08/2015  . Bursitis of left shoulder 07/13/2015  . Morbid obesity due to excess calories ([place]) 04/16/2015  . Nonspecific abnormal electrocardiogram (ECG) (EKG)  05/11/2011  . Tenosynovitis of finger 04/22/2011  . Type 2 diabetes, uncontrolled, with neuropathy ([place]) 03/02/2010  . Hyperlipidemia associated with type 2 diabetes mellitus ([place]) 03/02/2010  . Depression with anxiety 03/02/2010  . Essential hypertension 03/02/2010  . Chronic rhinitis 03/02/2010  . Upper airway cough syndrome 03/02/2010  . GERD 03/02/2010    Carey Bullocks, OTR/L 07/04/2020, 10:22 AM  [facility] [address] [place], [place], [address] Phone: [phone]   Fax:  [phone]  Name: Heidi Franklin MRN: [id number] Date of Birth: [birth year]

## 2020-07-04 NOTE — Telephone Encounter (Signed)
Hello Dr. Georgina Snell,   I evaluated Heidi Franklin today for O.T for bilateral hand pain (LT CTS, RT 4th and 5th trigger finger). I noticed you did an injection to the Rt 5th digit. Would it be appropriate for iontophoresis (using dexamethasone) this close after an injection? I would apply the pad over the base of the 3rd and 4th fingers more, but didn't know if this would be ok considering she just got a corticosteroid injection.  Thanks [employer]

## 2020-07-05 ENCOUNTER — Ambulatory Visit: Payer: Medicare Other | Admitting: Occupational Therapy

## 2020-07-05 DIAGNOSIS — M6281 Muscle weakness (generalized): Secondary | ICD-10-CM

## 2020-07-05 DIAGNOSIS — R209 Unspecified disturbances of skin sensation: Secondary | ICD-10-CM | POA: Diagnosis not present

## 2020-07-05 DIAGNOSIS — M79642 Pain in left hand: Secondary | ICD-10-CM

## 2020-07-05 DIAGNOSIS — M79641 Pain in right hand: Secondary | ICD-10-CM | POA: Diagnosis not present

## 2020-07-05 DIAGNOSIS — R208 Other disturbances of skin sensation: Secondary | ICD-10-CM

## 2020-07-05 NOTE — Therapy (Signed)
[facility] [address] [place], [place], [address] Phone: [phone]   Fax:  [phone]  Occupational Therapy Treatment  Patient Details  Name: Heidi Franklin MRN: [id number] Date of Birth: Dec 17, 1945 Referring Provider (OT): Dr. Georgina Snell   Encounter Date: 07/05/2020   OT End of Session - 07/05/20 1213    Visit Number 2    Number of Visits 17    Date for OT Re-Evaluation 09/02/20    Authorization Type MCR    Authorization - Visit Number 2    Authorization - Number of Visits 10    Progress Note Due on Visit 10    OT Start Time 1106    OT Stop Time 1147    OT Time Calculation (min) 41 min    Activity Tolerance Patient tolerated treatment well    Behavior During Therapy [facility] for tasks assessed/performed           Past Medical History:  Diagnosis Date  . ALLERGIC RHINITIS   . Allergy   . Anxiety state, unspecified   . Arthritis   . Asthma   . BRONCHITIS, CHRONIC   . Chronic low back pain 10/09/2016  . DEPRESSION   . DIABETES MELLITUS, TYPE II   . GERD   . HYPERLIPIDEMIA   . HYPERTENSION   . Psoriasis     Past Surgical History:  Procedure Laterality Date  . ABDOMINAL HYSTERECTOMY  1980  . TRIGGER FINGER RELEASE     Index finger    There were no vitals filed for this visit.   Subjective Assessment - 07/05/20 1115    Subjective  I really started having problems in June after my sister-n-law passed, but I had been taking care of her and that aggravated my hands    Pertinent History Lt CTS, RT trigger finger of 4th and 5th digits. PMH: OA, DM, HTN, HLD    Currently in Pain? Yes    Pain Score 7     Pain Location Hand    Pain Orientation Right;Left    Pain Descriptors / Indicators Tender;Sharp    Pain Type Chronic pain    Pain Onset More than a month ago    Aggravating Factors  use    Pain Relieving Factors brace for Lt wrist at night           Pt brought in current prefab wrist brace to assess -  brace very bulky and slightly too big. Pt fitted for less bulky Lt D-ring wrist cock up splint which pt prefers, therefore issued. Reviewed wear and care with patient. Pt also fitted for Rt ring finger trigger finger pre fab splint and issued and told to where w/ aggravating activities or if becomes painful  Pt issued CTS conservative management handout with recommendations and things to avoid as well as issued lower median n. Gliding ex's and tendon gliding ex's. Pt required cues and multiple reviews to perform correctly. Pt also issued composite extension stretch for both hands.   Pt instructed in ice massage for volar Rt hand at base of 3-5th digits for 5-7 minutes prn. Pt also encouraged to use heat for Lt hand prn for pain management                      OT Education - 07/05/20 1220    Education Details splint wear and care, HEP, proper use of ice/heat modalities    Person(s) Educated Patient    Methods Explanation;Demonstration;Handout  Comprehension Verbalized understanding;Returned demonstration            OT Short Term Goals - 07/05/20 1213      OT SHORT TERM GOAL #1   Title Independent with pain management strategies including splinting, modalities, task modifications, and potential A/E needs for both hands    Time 4    Period Weeks    Status On-going      OT SHORT TERM GOAL #2   Title Pt to report less pain bilateral hands with functional tasks    Time 4    Period Weeks    Status New      OT SHORT TERM GOAL #3   Title Pt to verbalize understanding with splint wear and care prn and A/E to make opening jars/containers easier    Time 4    Period Weeks    Status On-going      OT SHORT TERM GOAL #4   Title Independent with HEP for Lt CTS including median n. gliding ex's and for Rt hand ROM ex's    Time 4    Period Weeks    Status On-going             OT Long Term Goals - 07/04/20 1016      OT LONG TERM GOAL #1   Title Pt to verbalize  understanding with strengthening HEP    Time 8    Period Weeks    Status New      OT LONG TERM GOAL #2   Title Grip strength to improve bilaterally by 10 lbs or greater    Baseline Rt = 26 lbs, Lt = 22 lbs    Time 8    Period Weeks    Status New      OT LONG TERM GOAL #3   Title Pt to improve 3 tip pinch by 3 lbs bilaterally    Baseline Rt = 6, Lt = 4 lbs.    Time 8    Period Weeks    Status New                 Plan - 07/05/20 1214    Clinical Impression Statement Pt progressing towards some STG's and has better understanding of pain management strategies and initial HEP for Lt hand    OT Occupational Profile and History Problem Focused Assessment - Including review of records relating to presenting problem    Occupational performance deficits (Please refer to evaluation for details): IADL's;Leisure    Body Structure / Function / Physical Skills ADL;ROM;IADL;Edema;Sensation;Strength;Flexibility;Coordination;UE functional use;Pain    Rehab Potential Good    Clinical Decision Making Limited treatment options, no task modification necessary    Comorbidities Affecting Occupational Performance: None    Modification or Assistance to Complete Evaluation  No modification of tasks or assist necessary to complete eval    OT Frequency 2x / week    OT Duration 8 weeks   may d/c earlier depending on progress   OT Treatment/Interventions Self-care/ADL training;Therapeutic exercise;Ultrasound;Manual Therapy;Splinting;Iontophoresis;Therapeutic activities;Paraffin;Cryotherapy;DME and/or AE instruction;Compression bandaging;Fluidtherapy;Electrical Stimulation;Scar mobilization;Moist Heat;Passive range of motion;Patient/family education;Contrast Bath    Plan ionto dose #1, review previously issued HEP prn    Consulted and Agree with Plan of Care Patient           Patient will benefit from skilled therapeutic intervention in order to improve the following deficits and impairments:   Body  Structure / Function / Physical Skills: ADL, ROM, IADL, Edema, Sensation, Strength, Flexibility, Coordination, UE functional  use, Pain       Visit Diagnosis: Pain in right hand  Pain in left hand  Muscle weakness (generalized)  Other disturbances of skin sensation    Problem List Patient Active Problem List   Diagnosis Date Noted  . Pedal edema 03/27/2020  . Toe ulcer ([place]) 03/10/2020  . History of chicken pox 04/11/2019  . Vitamin D deficiency 04/05/2019  . Lumbar radiculopathy 03/31/2019  . Wheezing 02/25/2019  . Neck pain 07/06/2018  . Trapezius muscle strain, right, initial encounter 06/23/2018  . Edema 06/23/2018  . Acute gout 04/08/2018  . Herpes simplex 02/10/2018  . ETD (eustachian tube dysfunction) 02/10/2018  . Peroneal tendinitis, right 01/06/2018  . Psoriasis 11/14/2017  . Acute bursitis of right shoulder 09/29/2017  . Bilateral hip pain 07/10/2017  . Bronchiectasis without complication ([place]) 35/36/1443  . Irritable larynx syndrome 04/25/2017  . Reflux laryngitis 04/23/2017  . Benign lipomatous neoplasm of skin and subcutaneous tissue of right leg 02/25/2017  . Anemia 02/05/2017  . Fatigue 01/23/2017  . Cough 01/02/2017  . Multiple pulmonary nodules 11/10/2016  . Chronic low back pain 10/09/2016  . Degenerative arthritis of right knee 06/20/2016  . Degenerative arthritis of left knee 04/10/2016  . Bronchitis 12/15/2015  . Peroneal tendinitis of left lower extremity 09/05/2015  . Recurrent falls 08/08/2015  . Bursitis of left shoulder 07/13/2015  . Morbid obesity due to excess calories ([place]) 04/16/2015  . Nonspecific abnormal electrocardiogram (ECG) (EKG) 05/11/2011  . Tenosynovitis of finger 04/22/2011  . Type 2 diabetes, uncontrolled, with neuropathy ([place]) 03/02/2010  . Hyperlipidemia associated with type 2 diabetes mellitus ([place]) 03/02/2010  . Depression with anxiety 03/02/2010  . Essential hypertension 03/02/2010  . Chronic rhinitis 03/02/2010    . Upper airway cough syndrome 03/02/2010  . GERD 03/02/2010    Carey Bullocks, OTR/L 07/05/2020, 12:25 PM  [facility] [address] [place], [place], [address] Phone: [phone]   Fax:  [phone]  Name: Heidi Franklin MRN: [id number] Date of Birth: 06-Jun-1946

## 2020-07-05 NOTE — Telephone Encounter (Signed)
Yes I think that should be ok

## 2020-07-07 ENCOUNTER — Telehealth: Payer: Self-pay | Admitting: Family Medicine

## 2020-07-07 DIAGNOSIS — L400 Psoriasis vulgaris: Secondary | ICD-10-CM | POA: Diagnosis not present

## 2020-07-07 NOTE — Telephone Encounter (Signed)
Left message on machine.

## 2020-07-07 NOTE — Telephone Encounter (Signed)
Patient calling in reference to flu vaccine availability.

## 2020-07-07 NOTE — Telephone Encounter (Signed)
Patient would like to be notified once flu vaccines  they come in. She would like to schedule her and spouse

## 2020-07-14 ENCOUNTER — Ambulatory Visit (INDEPENDENT_AMBULATORY_CARE_PROVIDER_SITE_OTHER): Payer: Medicare Other

## 2020-07-14 ENCOUNTER — Other Ambulatory Visit: Payer: Self-pay

## 2020-07-14 DIAGNOSIS — Z23 Encounter for immunization: Secondary | ICD-10-CM

## 2020-07-20 ENCOUNTER — Ambulatory Visit: Payer: Medicare Other | Admitting: Occupational Therapy

## 2020-07-20 ENCOUNTER — Other Ambulatory Visit: Payer: Self-pay

## 2020-07-20 DIAGNOSIS — M6281 Muscle weakness (generalized): Secondary | ICD-10-CM

## 2020-07-20 DIAGNOSIS — M79641 Pain in right hand: Secondary | ICD-10-CM | POA: Diagnosis not present

## 2020-07-20 DIAGNOSIS — R208 Other disturbances of skin sensation: Secondary | ICD-10-CM

## 2020-07-20 DIAGNOSIS — R209 Unspecified disturbances of skin sensation: Secondary | ICD-10-CM | POA: Diagnosis not present

## 2020-07-20 DIAGNOSIS — M79642 Pain in left hand: Secondary | ICD-10-CM | POA: Diagnosis not present

## 2020-07-20 NOTE — Therapy (Signed)
[facility] [address] [place], [place], [address] Phone: [phone]   Fax:  [phone]  Occupational Therapy Treatment  Patient Details  Name: Heidi Franklin MRN: [id number] Date of Birth: 09/03/1946 Referring Provider (OT): Dr. Georgina Snell   Encounter Date: 07/20/2020   OT End of Session - 07/20/20 1600    Visit Number 3    Number of Visits 17    Date for OT Re-Evaluation 09/02/20    Authorization Type MCR    Authorization - Visit Number 3    Authorization - Number of Visits 10    Progress Note Due on Visit 10    OT Start Time 0350    OT Stop Time 1612    OT Time Calculation (min) 42 min    Activity Tolerance Patient tolerated treatment well    Behavior During Therapy [facility] for tasks assessed/performed           Past Medical History:  Diagnosis Date  . ALLERGIC RHINITIS   . Allergy   . Anxiety state, unspecified   . Arthritis   . Asthma   . BRONCHITIS, CHRONIC   . Chronic low back pain 10/09/2016  . DEPRESSION   . DIABETES MELLITUS, TYPE II   . GERD   . HYPERLIPIDEMIA   . HYPERTENSION   . Psoriasis     Past Surgical History:  Procedure Laterality Date  . ABDOMINAL HYSTERECTOMY  1980  . TRIGGER FINGER RELEASE     Index finger    There were no vitals filed for this visit.   Subjective Assessment - 07/20/20 1529    Subjective  I'm still wearing that brace you gave me for my Lt wrist but the finger splint for my Rt ring finger feels too big    Pertinent History Lt CTS, RT trigger finger of 4th and 5th digits. PMH: OA, DM, HTN, HLD    Currently in Pain? Yes    Pain Score 8     Pain Location Hand    Pain Orientation Right;Left   Lt hand 6/10   Pain Descriptors / Indicators Tender;Sore;Aching   Lt hand tingling/numbness   Pain Type Chronic pain    Pain Onset More than a month ago    Pain Frequency Constant    Aggravating Factors  use    Pain Relieving Factors brace for Lt wrist at night                         OT Treatments/Exercises (OP) - 07/20/20 0001      ADLs   ADL Comments Pt issued handout on iontophoresis precautions and contraindications - reviewed w/ patient. Pt has diabetes and instructed to monitor blood glucose following iontophoresis. Reviewed previously issued HEP - Pt reported she had been doing median n gliding ex's and tendon gliding ex's for Rt hand instead of Lt hand - pt instructed these ex's were meant for Lt hand only (therapist had written this on her HEP last session). Anticipate this is part of the reason Rt hand pain is worse today. Hopefully iontophoresis will help Rt hand pain (see below)       Modalities   Modalities Iontophoresis      Iontophoresis   Type of Iontophoresis Dexamethasone    Location Rt hand, at base of 3rd and 4th digits    Dose 1.5 cc at 2.2 mA current   (dose #1)    Time 18 minutes + 5 minutes set up =  23 minutes total                    OT Short Term Goals - 07/20/20 1606      OT SHORT TERM GOAL #1   Title Independent with pain management strategies including splinting, modalities, task modifications, and potential A/E needs for both hands    Time 4    Period Weeks    Status On-going      OT SHORT TERM GOAL #2   Title Pt to report less pain bilateral hands with functional tasks    Time 4    Period Weeks    Status New      OT SHORT TERM GOAL #3   Title Pt to verbalize understanding with splint wear and care prn and A/E to make opening jars/containers easier    Time 4    Period Weeks    Status On-going      OT SHORT TERM GOAL #4   Title Independent with HEP for Lt CTS including median n. gliding ex's and for Rt hand ROM ex's    Time 4    Period Weeks    Status On-going             OT Long Term Goals - 07/04/20 1016      OT LONG TERM GOAL #1   Title Pt to verbalize understanding with strengthening HEP    Time 8    Period Weeks    Status New      OT LONG TERM GOAL #2   Title Grip  strength to improve bilaterally by 10 lbs or greater    Baseline Rt = 26 lbs, Lt = 22 lbs    Time 8    Period Weeks    Status New      OT LONG TERM GOAL #3   Title Pt to improve 3 tip pinch by 3 lbs bilaterally    Baseline Rt = 6, Lt = 4 lbs.    Time 8    Period Weeks    Status New                 Plan - 07/20/20 1606    Clinical Impression Statement Pt w/ increased pain in Rt hand today but reports she was performing HEP meant for Lt hand CTS to Rt hand - therapist clarified further today that these ex's were only meant for Lt hand.    OT Occupational Profile and History Problem Focused Assessment - Including review of records relating to presenting problem    Occupational performance deficits (Please refer to evaluation for details): IADL's;Leisure    Body Structure / Function / Physical Skills ADL;ROM;IADL;Edema;Sensation;Strength;Flexibility;Coordination;UE functional use;Pain    Rehab Potential Good    Clinical Decision Making Limited treatment options, no task modification necessary    Comorbidities Affecting Occupational Performance: None    Modification or Assistance to Complete Evaluation  No modification of tasks or assist necessary to complete eval    OT Frequency 2x / week    OT Duration 8 weeks   may d/c earlier depending on progress   OT Treatment/Interventions Self-care/ADL training;Therapeutic exercise;Ultrasound;Manual Therapy;Splinting;Iontophoresis;Therapeutic activities;Paraffin;Cryotherapy;DME and/or AE instruction;Compression bandaging;Fluidtherapy;Electrical Stimulation;Scar mobilization;Moist Heat;Passive range of motion;Patient/family education;Contrast Bath    Plan ionto dose #2 for Rt hand, pt to bring in finger splint for Rt ring finger to assess - may need smaller size now    Consulted and Agree with Plan of Care Patient  Patient will benefit from skilled therapeutic intervention in order to improve the following deficits and impairments:    Body Structure / Function / Physical Skills: ADL, ROM, IADL, Edema, Sensation, Strength, Flexibility, Coordination, UE functional use, Pain       Visit Diagnosis: Pain in right hand  Pain in left hand  Muscle weakness (generalized)  Other disturbances of skin sensation    Problem List Patient Active Problem List   Diagnosis Date Noted  . Pedal edema 03/27/2020  . Toe ulcer ([place]) 03/10/2020  . History of chicken pox 04/11/2019  . Vitamin D deficiency 04/05/2019  . Lumbar radiculopathy 03/31/2019  . Wheezing 02/25/2019  . Neck pain 07/06/2018  . Trapezius muscle strain, right, initial encounter 06/23/2018  . Edema 06/23/2018  . Acute gout 04/08/2018  . Herpes simplex 02/10/2018  . ETD (eustachian tube dysfunction) 02/10/2018  . Peroneal tendinitis, right 01/06/2018  . Psoriasis 11/14/2017  . Acute bursitis of right shoulder 09/29/2017  . Bilateral hip pain 07/10/2017  . Bronchiectasis without complication ([place]) 51/70/0174  . Irritable larynx syndrome 04/25/2017  . Reflux laryngitis 04/23/2017  . Benign lipomatous neoplasm of skin and subcutaneous tissue of right leg 02/25/2017  . Anemia 02/05/2017  . Fatigue 01/23/2017  . Cough 01/02/2017  . Multiple pulmonary nodules 11/10/2016  . Chronic low back pain 10/09/2016  . Degenerative arthritis of right knee 06/20/2016  . Degenerative arthritis of left knee 04/10/2016  . Bronchitis 12/15/2015  . Peroneal tendinitis of left lower extremity 09/05/2015  . Recurrent falls 08/08/2015  . Bursitis of left shoulder 07/13/2015  . Morbid obesity due to excess calories ([place]) 04/16/2015  . Nonspecific abnormal electrocardiogram (ECG) (EKG) 05/11/2011  . Tenosynovitis of finger 04/22/2011  . Type 2 diabetes, uncontrolled, with neuropathy ([place]) 03/02/2010  . Hyperlipidemia associated with type 2 diabetes mellitus ([place]) 03/02/2010  . Depression with anxiety 03/02/2010  . Essential hypertension 03/02/2010  . Chronic rhinitis  03/02/2010  . Upper airway cough syndrome 03/02/2010  . GERD 03/02/2010    Carey Bullocks, OTR/L 07/20/2020, 4:09 PM  [facility] [address] [place], [place], [address] Phone: [phone]   Fax:  [phone]  Name: Heidi Franklin MRN: [id number] Date of Birth: 1946/04/12

## 2020-07-21 DIAGNOSIS — L400 Psoriasis vulgaris: Secondary | ICD-10-CM | POA: Diagnosis not present

## 2020-07-21 DIAGNOSIS — B009 Herpesviral infection, unspecified: Secondary | ICD-10-CM | POA: Diagnosis not present

## 2020-07-21 DIAGNOSIS — B0089 Other herpesviral infection: Secondary | ICD-10-CM | POA: Diagnosis not present

## 2020-07-24 ENCOUNTER — Encounter: Payer: Self-pay | Admitting: Occupational Therapy

## 2020-07-24 ENCOUNTER — Other Ambulatory Visit: Payer: Self-pay

## 2020-07-24 ENCOUNTER — Ambulatory Visit: Payer: Medicare Other | Admitting: Occupational Therapy

## 2020-07-24 DIAGNOSIS — M79641 Pain in right hand: Secondary | ICD-10-CM

## 2020-07-24 DIAGNOSIS — M6281 Muscle weakness (generalized): Secondary | ICD-10-CM | POA: Diagnosis not present

## 2020-07-24 DIAGNOSIS — R209 Unspecified disturbances of skin sensation: Secondary | ICD-10-CM | POA: Diagnosis not present

## 2020-07-24 DIAGNOSIS — R208 Other disturbances of skin sensation: Secondary | ICD-10-CM

## 2020-07-24 DIAGNOSIS — M79642 Pain in left hand: Secondary | ICD-10-CM

## 2020-07-24 NOTE — Therapy (Addendum)
[facility] [address] [place], [place], [address] Phone: [phone]   Fax:  [phone]  Occupational Therapy Treatment  Patient Details  Name: Heidi Franklin MRN: [id number] Date of Birth: 05/05/1946 Referring Provider (OT): Dr. Georgina Snell   Encounter Date: 07/24/2020   OT End of Session - 07/24/20 1338    Visit Number 4    Number of Visits 17    Date for OT Re-Evaluation 09/02/20    Authorization Type MCR    Authorization - Visit Number 4    Authorization - Number of Visits 10    Progress Note Due on Visit 10    OT Start Time 0623    OT Stop Time 1354    OT Time Calculation (min) 37 min    Activity Tolerance Patient tolerated treatment well    Behavior During Therapy [facility] for tasks assessed/performed           Past Medical History:  Diagnosis Date  . ALLERGIC RHINITIS   . Allergy   . Anxiety state, unspecified   . Arthritis   . Asthma   . BRONCHITIS, CHRONIC   . Chronic low back pain 10/09/2016  . DEPRESSION   . DIABETES MELLITUS, TYPE II   . GERD   . HYPERLIPIDEMIA   . HYPERTENSION   . Psoriasis     Past Surgical History:  Procedure Laterality Date  . ABDOMINAL HYSTERECTOMY  1980  . TRIGGER FINGER RELEASE     Index finger    There were no vitals filed for this visit.   Subjective Assessment - 07/24/20 1344    Subjective  Only mild left hand pain today    Pertinent History Lt CTS, RT trigger finger of 4th and 5th digits. PMH: OA, DM, HTN, HLD    Currently in Pain? Yes    Pain Score 2     Pain Location Hand    Pain Orientation Left    Pain Descriptors / Indicators Aching    Pain Type Chronic pain    Pain Onset More than a month ago    Pain Frequency Intermittent    Aggravating Factors  use    Pain Relieving Factors paraffin                        OT Treatments/Exercises (OP) - 07/24/20 0001      Exercises   Exercises Hand   tendon gliding for LUE, min v.c and  demonstration     Modalities   Modalities Iontophoresis;Paraffin      Iontophoresis   Type of Iontophoresis Dexamethasone    Location Rt hand, at base of 3rd and 4th digits    Dose 1.5 cc at 2.2 mA current   doese #2   Time 22 mins and 5 min set up=27   No adverse reactions     LUE Paraffin   Number Minutes Paraffin 20 Minutes while ionto to RUE   LUE Paraffin Location Hand;Wrist    Comments for pain and stiffness   No adverse reactions.                 OT Education - 07/24/20 1337    Education Details tendongliding ex for LUE    Person(s) Educated Patient    Methods Explanation;Demonstration    Comprehension Verbalized understanding;Returned demonstration            OT Short Term Goals - 07/20/20 1606      OT SHORT TERM  GOAL #1   Title Independent with pain management strategies including splinting, modalities, task modifications, and potential A/E needs for both hands    Time 4    Period Weeks    Status On-going      OT SHORT TERM GOAL #2   Title Pt to report less pain bilateral hands with functional tasks    Time 4    Period Weeks    Status New      OT SHORT TERM GOAL #3   Title Pt to verbalize understanding with splint wear and care prn and A/E to make opening jars/containers easier    Time 4    Period Weeks    Status On-going      OT SHORT TERM GOAL #4   Title Independent with HEP for Lt CTS including median n. gliding ex's and for Rt hand ROM ex's    Time 4    Period Weeks    Status On-going             OT Long Term Goals - 07/04/20 1016      OT LONG TERM GOAL #1   Title Pt to verbalize understanding with strengthening HEP    Time 8    Period Weeks    Status New      OT LONG TERM GOAL #2   Title Grip strength to improve bilaterally by 10 lbs or greater    Baseline Rt = 26 lbs, Lt = 22 lbs    Time 8    Period Weeks    Status New      OT LONG TERM GOAL #3   Title Pt to improve 3 tip pinch by 3 lbs bilaterally    Baseline Rt = 6,  Lt = 4 lbs.    Time 8    Period Weeks    Status New                 Plan - 07/24/20 1340    Clinical Impression Statement Pt is progressing towards goals. with only minimal pain in [place].    OT Occupational Profile and History Problem Focused Assessment - Including review of records relating to presenting problem    Occupational performance deficits (Please refer to evaluation for details): IADL's;Leisure    Body Structure / Function / Physical Skills ADL;ROM;IADL;Edema;Sensation;Strength;Flexibility;Coordination;UE functional use;Pain    Rehab Potential Good    OT Frequency 2x / week    OT Duration 8 weeks    Plan ionto dose #3  for Rt hand, consider paraffin for LUE, pt to bring in finger splint for Rt ring finger to assess - may need smaller size now    Consulted and Agree with Plan of Care Patient           Patient will benefit from skilled therapeutic intervention in order to improve the following deficits and impairments:   Body Structure / Function / Physical Skills: ADL, ROM, IADL, Edema, Sensation, Strength, Flexibility, Coordination, UE functional use, Pain       Visit Diagnosis: Pain in right hand  Pain in left hand  Muscle weakness (generalized)  Other disturbances of skin sensation    Problem List Patient Active Problem List   Diagnosis Date Noted  . Pedal edema 03/27/2020  . Toe ulcer ([place]) 03/10/2020  . History of chicken pox 04/11/2019  . Vitamin D deficiency 04/05/2019  . Lumbar radiculopathy 03/31/2019  . Wheezing 02/25/2019  . Neck pain 07/06/2018  . Trapezius muscle strain, right,  initial encounter 06/23/2018  . Edema 06/23/2018  . Acute gout 04/08/2018  . Herpes simplex 02/10/2018  . ETD (eustachian tube dysfunction) 02/10/2018  . Peroneal tendinitis, right 01/06/2018  . Psoriasis 11/14/2017  . Acute bursitis of right shoulder 09/29/2017  . Bilateral hip pain 07/10/2017  . Bronchiectasis without complication ([place]) 85/27/7824    . Irritable larynx syndrome 04/25/2017  . Reflux laryngitis 04/23/2017  . Benign lipomatous neoplasm of skin and subcutaneous tissue of right leg 02/25/2017  . Anemia 02/05/2017  . Fatigue 01/23/2017  . Cough 01/02/2017  . Multiple pulmonary nodules 11/10/2016  . Chronic low back pain 10/09/2016  . Degenerative arthritis of right knee 06/20/2016  . Degenerative arthritis of left knee 04/10/2016  . Bronchitis 12/15/2015  . Peroneal tendinitis of left lower extremity 09/05/2015  . Recurrent falls 08/08/2015  . Bursitis of left shoulder 07/13/2015  . Morbid obesity due to excess calories ([place]) 04/16/2015  . Nonspecific abnormal electrocardiogram (ECG) (EKG) 05/11/2011  . Tenosynovitis of finger 04/22/2011  . Type 2 diabetes, uncontrolled, with neuropathy ([place]) 03/02/2010  . Hyperlipidemia associated with type 2 diabetes mellitus ([place]) 03/02/2010  . Depression with anxiety 03/02/2010  . Essential hypertension 03/02/2010  . Chronic rhinitis 03/02/2010  . Upper airway cough syndrome 03/02/2010  . GERD 03/02/2010    Carlina Derks 07/24/2020, 1:56 PM Theone Murdoch, OTR/L [phone] Phone: [phone] 1:56 PM 07/24/20 [facility] [address] [place], [place], [address] Phone: [phone]   Fax:  [phone]  Name: Erick Oxendine MRN: [id number] Date of Birth: 1945/11/18

## 2020-08-01 ENCOUNTER — Ambulatory Visit: Payer: Medicare Other | Attending: Family Medicine | Admitting: Occupational Therapy

## 2020-08-01 ENCOUNTER — Other Ambulatory Visit: Payer: Self-pay

## 2020-08-01 DIAGNOSIS — M6281 Muscle weakness (generalized): Secondary | ICD-10-CM | POA: Diagnosis not present

## 2020-08-01 DIAGNOSIS — M79641 Pain in right hand: Secondary | ICD-10-CM | POA: Diagnosis not present

## 2020-08-01 DIAGNOSIS — M79642 Pain in left hand: Secondary | ICD-10-CM | POA: Diagnosis not present

## 2020-08-01 DIAGNOSIS — R208 Other disturbances of skin sensation: Secondary | ICD-10-CM

## 2020-08-01 NOTE — Therapy (Addendum)
[facility] [address] [place], [place], [address] Phone: [phone]   Fax:  [phone]  Occupational Therapy Treatment  Patient Details  Name: Heidi Franklin MRN: [id number] Date of Birth: Jul 15, 1946 Referring Provider (OT): Dr. Georgina Snell   Encounter Date: 08/01/2020   OT End of Session - 08/01/20 1348    Visit Number 5    Number of Visits 17    Date for OT Re-Evaluation 09/02/20    Authorization Type MCR    Authorization - Visit Number 5    Authorization - Number of Visits 10    Progress Note Due on Visit 10    OT Start Time 1320    OT Stop Time 1357    OT Time Calculation (min) 37 min    Activity Tolerance Patient tolerated treatment well    Behavior During Therapy [facility] for tasks assessed/performed           Past Medical History:  Diagnosis Date  . ALLERGIC RHINITIS   . Allergy   . Anxiety state, unspecified   . Arthritis   . Asthma   . BRONCHITIS, CHRONIC   . Chronic low back pain 10/09/2016  . DEPRESSION   . DIABETES MELLITUS, TYPE II   . GERD   . HYPERLIPIDEMIA   . HYPERTENSION   . Psoriasis     Past Surgical History:  Procedure Laterality Date  . ABDOMINAL HYSTERECTOMY  1980  . TRIGGER FINGER RELEASE     Index finger    There were no vitals filed for this visit.   Subjective Assessment - 08/01/20 1353    Subjective  Only mild left hand pain today    Pertinent History Lt CTS, RT trigger finger of 4th and 5th digits. PMH: OA, DM, HTN, HLD    Currently in Pain? Yes    Pain Score 4     Pain Location Hand    Pain Orientation Right    Pain Descriptors / Indicators Aching    Pain Type Chronic pain    Pain Onset More than a month ago    Pain Frequency Intermittent    Aggravating Factors  use    Pain Relieving Factors parraffin                        OT Treatments/Exercises (OP) - 08/01/20 0001      Iontophoresis   Type of Iontophoresis Dexamethasone    Location Rt hand,  at base of 3rd and 4th digits    Dose 1.5 cc at 2.1 mA current    Time 21 mins and 5 mins set up=26      LUE Paraffin   Number Minutes Paraffin 20 Minutes while receiving ionto on other hand   LUE Paraffin Location Hand;Wrist    Comments for pain and stiffness                  OT Education - 08/01/20 1341    Education Details trigger finger splint wear, pt's medium splint continues to fit appropriately , reviewed tendon gliding exercises, median n. Glides, recommended utensils with larger grips and bottle openers to minimize pain.   Person(s) Educated Patient    Methods Explanation;Demonstration;Handout;Verbal cues    Comprehension Verbalized understanding;Returned demonstration;Verbal cues required            OT Short Term Goals - 08/01/20 1340      OT SHORT TERM GOAL #1   Title Independent with pain management strategies  including splinting, modalities, task modifications, and potential A/E needs for both hands    Time 4    Period Weeks    Status On-going      OT SHORT TERM GOAL #2   Title Pt to report less pain bilateral hands with functional tasks    Time 4    Period Weeks    Status New      OT SHORT TERM GOAL #3   Title Pt to verbalize understanding with splint wear and care prn and A/E to make opening jars/containers easier    Time 4    Period Weeks    Status On-going      OT SHORT TERM GOAL #4   Title Independent with HEP for Lt CTS including median n. gliding ex's and for Rt hand ROM ex's    Time 4    Period Weeks    Status On-going             OT Long Term Goals - 07/04/20 1016      OT LONG TERM GOAL #1   Title Pt to verbalize understanding with strengthening HEP    Time 8    Period Weeks    Status New      OT LONG TERM GOAL #2   Title Grip strength to improve bilaterally by 10 lbs or greater    Baseline Rt = 26 lbs, Lt = 22 lbs    Time 8    Period Weeks    Status New      OT LONG TERM GOAL #3   Title Pt to improve 3 tip pinch by 3  lbs bilaterally    Baseline Rt = 6, Lt = 4 lbs.    Time 8    Period Weeks    Status New                 Plan - 08/01/20 1339    Clinical Impression Statement Pt is progressing towards goals, she demonstrates continued improvement in UE pain    OT Occupational Profile and History Problem Focused Assessment - Including review of records relating to presenting problem    Occupational performance deficits (Please refer to evaluation for details): IADL's;Leisure    Body Structure / Function / Physical Skills ADL;ROM;IADL;Edema;Sensation;Strength;Flexibility;Coordination;UE functional use;Pain    Rehab Potential Good    OT Frequency 2x / week    OT Duration 8 weeks    Plan ionto dose #4  for Rt hand, consider paraffin for LUE,    Consulted and Agree with Plan of Care Patient           Patient will benefit from skilled therapeutic intervention in order to improve the following deficits and impairments:   Body Structure / Function / Physical Skills: ADL, ROM, IADL, Edema, Sensation, Strength, Flexibility, Coordination, UE functional use, Pain       Visit Diagnosis: Pain in right hand  Pain in left hand  Muscle weakness (generalized)  Other disturbances of skin sensation    Problem List Patient Active Problem List   Diagnosis Date Noted  . Pedal edema 03/27/2020  . Toe ulcer ([place]) 03/10/2020  . History of chicken pox 04/11/2019  . Vitamin D deficiency 04/05/2019  . Lumbar radiculopathy 03/31/2019  . Wheezing 02/25/2019  . Neck pain 07/06/2018  . Trapezius muscle strain, right, initial encounter 06/23/2018  . Edema 06/23/2018  . Acute gout 04/08/2018  . Herpes simplex 02/10/2018  . ETD (eustachian tube dysfunction) 02/10/2018  . Peroneal tendinitis,  right 01/06/2018  . Psoriasis 11/14/2017  . Acute bursitis of right shoulder 09/29/2017  . Bilateral hip pain 07/10/2017  . Bronchiectasis without complication ([place]) 43/73/5789  . Irritable larynx syndrome  04/25/2017  . Reflux laryngitis 04/23/2017  . Benign lipomatous neoplasm of skin and subcutaneous tissue of right leg 02/25/2017  . Anemia 02/05/2017  . Fatigue 01/23/2017  . Cough 01/02/2017  . Multiple pulmonary nodules 11/10/2016  . Chronic low back pain 10/09/2016  . Degenerative arthritis of right knee 06/20/2016  . Degenerative arthritis of left knee 04/10/2016  . Bronchitis 12/15/2015  . Peroneal tendinitis of left lower extremity 09/05/2015  . Recurrent falls 08/08/2015  . Bursitis of left shoulder 07/13/2015  . Morbid obesity due to excess calories ([place]) 04/16/2015  . Nonspecific abnormal electrocardiogram (ECG) (EKG) 05/11/2011  . Tenosynovitis of finger 04/22/2011  . Type 2 diabetes, uncontrolled, with neuropathy ([place]) 03/02/2010  . Hyperlipidemia associated with type 2 diabetes mellitus ([place]) 03/02/2010  . Depression with anxiety 03/02/2010  . Essential hypertension 03/02/2010  . Chronic rhinitis 03/02/2010  . Upper airway cough syndrome 03/02/2010  . GERD 03/02/2010    Heidi Franklin 08/01/2020, 4:35 PM Theone Murdoch, OTR/L [phone] Phone: [phone] 4:35 PM 10/05/21[facility] [address] [place], [place], [address] Phone: [phone]   Fax:  [phone]  Name: Heidi Franklin MRN: [id number] Date of Birth: [birth year]

## 2020-08-03 ENCOUNTER — Other Ambulatory Visit: Payer: Self-pay

## 2020-08-03 ENCOUNTER — Ambulatory Visit: Payer: Medicare Other | Admitting: Occupational Therapy

## 2020-08-03 DIAGNOSIS — R208 Other disturbances of skin sensation: Secondary | ICD-10-CM | POA: Diagnosis not present

## 2020-08-03 DIAGNOSIS — M6281 Muscle weakness (generalized): Secondary | ICD-10-CM

## 2020-08-03 DIAGNOSIS — M79642 Pain in left hand: Secondary | ICD-10-CM | POA: Diagnosis not present

## 2020-08-03 DIAGNOSIS — M79641 Pain in right hand: Secondary | ICD-10-CM

## 2020-08-03 NOTE — Therapy (Signed)
[facility] [address] [place], [place], [address] Phone: [phone]   Fax:  [phone]  Occupational Therapy Treatment  Patient Details  Name: Heidi [place] MRN: [id number] Date of Birth: 1946-04-01 Referring Provider (OT): Dr. Georgina Snell   Encounter Date: 08/03/2020   OT End of Session - 08/03/20 1430    Visit Number 6    Number of Visits 17    Date for OT Re-Evaluation 09/02/20    Authorization Type MCR    Authorization - Visit Number 6    Authorization - Number of Visits 10    Progress Note Due on Visit 10    OT Start Time 1407    OT Stop Time 1445    OT Time Calculation (min) 38 min    Activity Tolerance Patient tolerated treatment well    Behavior During Therapy [facility] for tasks assessed/performed           Past Medical History:  Diagnosis Date   ALLERGIC RHINITIS    Allergy    Anxiety state, unspecified    Arthritis    Asthma    BRONCHITIS, CHRONIC    Chronic low back pain 10/09/2016   DEPRESSION    DIABETES MELLITUS, TYPE II    GERD    HYPERLIPIDEMIA    HYPERTENSION    Psoriasis     Past Surgical History:  Procedure Laterality Date   ABDOMINAL HYSTERECTOMY  1980   TRIGGER FINGER RELEASE     Index finger    There were no vitals filed for this visit.           OT Treatments/Exercises (OP) -      Iontophoresis   Type of Iontophoresis Dexamethasone    Location Rt hand, at base of 3rd and 4th digits    Dose 1.5 cc at 2.2 mA current    Time 21 mins and 5 mins set up=26      LUE Paraffin   Number Minutes Paraffin 20 Minutes while receiving ionto on other hand   LUE Paraffin Location Hand;Wrist    Comments for pain and stiffness      Pt was issued a small trigger finger splint for right ring finger, it appears to be fitting well . Pt was instructed if it becomes too tight she should wear medium. Education regarding positions to avoid to minimize carpal tunnel  symptoms and issued the conservative tx of CTS handout with exercises, 1 and 2.Reveiwed tendon and nerve gliding exercises, no adverse reactions(pt performed exercises with LUE while ionto was on RUE)              OT Short Term Goals - 08/03/20 1455      OT SHORT TERM GOAL #1   Title Independent with pain management strategies including splinting, modalities, task modifications, and potential A/E needs for both hands    Time 4    Period Weeks    Status On-going      OT SHORT TERM GOAL #2   Title Pt to report less pain bilateral hands with functional tasks    Time 4    Period Weeks    Status New      OT SHORT TERM GOAL #3   Title Pt to verbalize understanding with splint wear and care prn and A/E to make opening jars/containers easier    Time 4    Period Weeks    Status On-going      OT SHORT TERM GOAL #4   Title  Independent with HEP for Lt CTS including median n. gliding ex's and for Rt hand ROM ex's    Time 4    Period Weeks    Status On-going             OT Long Term Goals - 08/03/20 1455      OT LONG TERM GOAL #1   Title Pt to verbalize understanding with strengthening HEP    Time 8    Period Weeks    Status On-going      OT LONG TERM GOAL #2   Title Grip strength to improve bilaterally by 10 lbs or greater    Baseline Rt = 26 lbs, Lt = 22 lbs    Time 8    Period Weeks    Status On-going      OT LONG TERM GOAL #3   Title Pt to improve 3 tip pinch by 3 lbs bilaterally    Baseline Rt = 6, Lt = 4 lbs.    Time 8    Period Weeks    Status On-going                 Plan - 08/03/20 1431    Clinical Impression Statement Pt is progressing towards goals, she demonstrates continued improvement in UE pain she continues to have numbness in LUE.    OT Occupational Profile and History Problem Focused Assessment - Including review of records relating to presenting problem    Occupational performance deficits (Please refer to evaluation for details):  IADL's;Leisure    Body Structure / Function / Physical Skills ADL;ROM;IADL;Edema;Sensation;Strength;Flexibility;Coordination;UE functional use;Pain    Rehab Potential Good    OT Frequency 2x / week    OT Duration 8 weeks    OT Treatment/Interventions Self-care/ADL training;Therapeutic exercise;Ultrasound;Manual Therapy;Splinting;Iontophoresis;Therapeutic activities;Paraffin;Cryotherapy;DME and/or AE instruction;Compression bandaging;Fluidtherapy;Electrical Stimulation;Scar mobilization;Moist Heat;Passive range of motion;Patient/family education;Contrast Bath    Plan ionto dose #5 for Rt hand, consider paraffin for LUE,    Consulted and Agree with Plan of Care Patient           Patient will benefit from skilled therapeutic intervention in order to improve the following deficits and impairments:   Body Structure / Function / Physical Skills: ADL, ROM, IADL, Edema, Sensation, Strength, Flexibility, Coordination, UE functional use, Pain       Visit Diagnosis: Pain in right hand  Pain in left hand  Muscle weakness (generalized)  Other disturbances of skin sensation    Problem List Patient Active Problem List   Diagnosis Date Noted   Pedal edema 03/27/2020   Toe ulcer ([place]) 03/10/2020   History of chicken pox 04/11/2019   Vitamin D deficiency 04/05/2019   Lumbar radiculopathy 03/31/2019   Wheezing 02/25/2019   Neck pain 07/06/2018   Trapezius muscle strain, right, initial encounter 06/23/2018   Edema 06/23/2018   Acute gout 04/08/2018   Herpes simplex 02/10/2018   ETD (eustachian tube dysfunction) 02/10/2018   Peroneal tendinitis, right 01/06/2018   Psoriasis 11/14/2017   Acute bursitis of right shoulder 09/29/2017   Bilateral hip pain 07/10/2017   Bronchiectasis without complication ([place]) 81/82/9937   Irritable larynx syndrome 04/25/2017   Reflux laryngitis 04/23/2017   Benign lipomatous neoplasm of skin and subcutaneous tissue of right leg  02/25/2017   Anemia 02/05/2017   Fatigue 01/23/2017   Cough 01/02/2017   Multiple pulmonary nodules 11/10/2016   Chronic low back pain 10/09/2016   Degenerative arthritis of right knee 06/20/2016   Degenerative arthritis of left knee 04/10/2016  Bronchitis 12/15/2015   Peroneal tendinitis of left lower extremity 09/05/2015   Recurrent falls 08/08/2015   Bursitis of left shoulder 07/13/2015   Morbid obesity due to excess calories ([place]) 04/16/2015   Nonspecific abnormal electrocardiogram (ECG) (EKG) 05/11/2011   Tenosynovitis of finger 04/22/2011   Type 2 diabetes, uncontrolled, with neuropathy ([place]) 03/02/2010   Hyperlipidemia associated with type 2 diabetes mellitus ([place]) 03/02/2010   Depression with anxiety 03/02/2010   Essential hypertension 03/02/2010   Chronic rhinitis 03/02/2010   Upper airway cough syndrome 03/02/2010   GERD 03/02/2010    Jon Kasparek 08/03/2020, 2:57 PM  [facility] [address] [place], [place], [address] Phone: [phone]   Fax:  [phone]  Name: Heidi [place] MRN: [id number] Date of Birth: 1946-07-29

## 2020-08-04 DIAGNOSIS — L400 Psoriasis vulgaris: Secondary | ICD-10-CM | POA: Diagnosis not present

## 2020-08-08 ENCOUNTER — Ambulatory Visit: Payer: Medicare Other | Admitting: Occupational Therapy

## 2020-08-08 ENCOUNTER — Other Ambulatory Visit: Payer: Self-pay

## 2020-08-08 ENCOUNTER — Ambulatory Visit: Payer: Medicare Other | Admitting: Sports Medicine

## 2020-08-08 ENCOUNTER — Ambulatory Visit (INDEPENDENT_AMBULATORY_CARE_PROVIDER_SITE_OTHER): Payer: Medicare Other | Admitting: Podiatry

## 2020-08-08 DIAGNOSIS — M79641 Pain in right hand: Secondary | ICD-10-CM | POA: Diagnosis not present

## 2020-08-08 DIAGNOSIS — Z5321 Procedure and treatment not carried out due to patient leaving prior to being seen by health care provider: Secondary | ICD-10-CM

## 2020-08-08 DIAGNOSIS — R208 Other disturbances of skin sensation: Secondary | ICD-10-CM | POA: Diagnosis not present

## 2020-08-08 DIAGNOSIS — M79642 Pain in left hand: Secondary | ICD-10-CM | POA: Diagnosis not present

## 2020-08-08 DIAGNOSIS — M6281 Muscle weakness (generalized): Secondary | ICD-10-CM | POA: Diagnosis not present

## 2020-08-08 NOTE — Therapy (Signed)
[facility] [address] [place], [place], [address] Phone: [phone]   Fax:  [phone]  Occupational Therapy Treatment  Patient Details  Name: Heidi [place] MRN: [id number] Date of Birth: December 16, 1945 Referring Provider (OT): Dr. Georgina Snell   Encounter Date: 08/08/2020   OT End of Session - 08/08/20 0922    Visit Number 7    Number of Visits 17    Date for OT Re-Evaluation 09/02/20    Authorization Type MCR    Authorization - Visit Number 7    Authorization - Number of Visits 10    Progress Note Due on Visit 10    OT Start Time 0848    OT Stop Time 0935    OT Time Calculation (min) 47 min    Activity Tolerance Patient tolerated treatment well    Behavior During Therapy [facility] for tasks assessed/performed           Past Medical History:  Diagnosis Date  . ALLERGIC RHINITIS   . Allergy   . Anxiety state, unspecified   . Arthritis   . Asthma   . BRONCHITIS, CHRONIC   . Chronic low back pain 10/09/2016  . DEPRESSION   . DIABETES MELLITUS, TYPE II   . GERD   . HYPERLIPIDEMIA   . HYPERTENSION   . Psoriasis     Past Surgical History:  Procedure Laterality Date  . ABDOMINAL HYSTERECTOMY  1980  . TRIGGER FINGER RELEASE     Index finger    There were no vitals filed for this visit.   Subjective Assessment - 08/08/20 0856    Subjective  My Lt hand is more numbness but not a lot of pain    Pertinent History Lt CTS, RT trigger finger of 4th and 5th digits. PMH: OA, DM, HTN, HLD    Currently in Pain? Yes    Pain Score 3     Pain Location Hand    Pain Orientation Right    Pain Descriptors / Indicators Aching    Pain Type Chronic pain    Pain Onset More than a month ago    Pain Frequency Intermittent    Aggravating Factors  use    Pain Relieving Factors rest                        OT Treatments/Exercises (OP) - 08/08/20 0001      ADLs   ADL Comments Discussed task modifications including  adaptive strategies, making handles larger when possible, stretching hands after sustained gripping like driving (and alternating hands when safe), and A/E like jar openers, can openers, choppers, etc      Modalities   Modalities Iontophoresis;Fluidotherapy      Iontophoresis   Type of Iontophoresis Dexamethasone (dose #5)   Location Rt hand, at base of 3rd and 4th digits    Dose 1.5 cc at 2.2 mA current    Time 18 mins + 5 min set up = 23 mins total      LUE Fluidotherapy   Number Minutes Fluidotherapy 12 Minutes    LUE Fluidotherapy Location Hand;Wrist    Comments to decrease pain and numbness for CTS Lt hand                   OT Short Term Goals - 08/03/20 1455      OT SHORT TERM GOAL #1   Title Independent with pain management strategies including splinting, modalities, task modifications, and potential  A/E needs for both hands    Time 4    Period Weeks    Status On-going      OT SHORT TERM GOAL #2   Title Pt to report less pain bilateral hands with functional tasks    Time 4    Period Weeks    Status New      OT SHORT TERM GOAL #3   Title Pt to verbalize understanding with splint wear and care prn and A/E to make opening jars/containers easier    Time 4    Period Weeks    Status On-going      OT SHORT TERM GOAL #4   Title Independent with HEP for Lt CTS including median n. gliding ex's and for Rt hand ROM ex's    Time 4    Period Weeks    Status On-going             OT Long Term Goals - 08/03/20 1455      OT LONG TERM GOAL #1   Title Pt to verbalize understanding with strengthening HEP    Time 8    Period Weeks    Status On-going      OT LONG TERM GOAL #2   Title Grip strength to improve bilaterally by 10 lbs or greater    Baseline Rt = 26 lbs, Lt = 22 lbs    Time 8    Period Weeks    Status On-going      OT LONG TERM GOAL #3   Title Pt to improve 3 tip pinch by 3 lbs bilaterally    Baseline Rt = 6, Lt = 4 lbs.    Time 8    Period  Weeks    Status On-going                 Plan - 08/08/20 4562    Clinical Impression Statement Pt is progressing towards goals, she demonstrates continued improvement in UE pain she continues to have numbness in LUE.    OT Occupational Profile and History Problem Focused Assessment - Including review of records relating to presenting problem    Occupational performance deficits (Please refer to evaluation for details): IADL's;Leisure    Body Structure / Function / Physical Skills ADL;ROM;IADL;Edema;Sensation;Strength;Flexibility;Coordination;UE functional use;Pain    Rehab Potential Good    OT Frequency 2x / week    OT Duration 8 weeks    OT Treatment/Interventions Self-care/ADL training;Therapeutic exercise;Ultrasound;Manual Therapy;Splinting;Iontophoresis;Therapeutic activities;Paraffin;Cryotherapy;DME and/or AE instruction;Compression bandaging;Fluidtherapy;Electrical Stimulation;Scar mobilization;Moist Heat;Passive range of motion;Patient/family education;Contrast Bath    Plan ionto dose #6 for Rt hand, consider paraffin for LUE, check STG's   Consulted and Agree with Plan of Care Patient           Patient will benefit from skilled therapeutic intervention in order to improve the following deficits and impairments:   Body Structure / Function / Physical Skills: ADL, ROM, IADL, Edema, Sensation, Strength, Flexibility, Coordination, UE functional use, Pain       Visit Diagnosis: Pain in right hand  Pain in left hand  Muscle weakness (generalized)    Problem List Patient Active Problem List   Diagnosis Date Noted  . Pedal edema 03/27/2020  . Toe ulcer ([place]) 03/10/2020  . History of chicken pox 04/11/2019  . Vitamin D deficiency 04/05/2019  . Lumbar radiculopathy 03/31/2019  . Wheezing 02/25/2019  . Neck pain 07/06/2018  . Trapezius muscle strain, right, initial encounter 06/23/2018  . Edema 06/23/2018  . Acute gout 04/08/2018  .  Herpes simplex 02/10/2018  .  ETD (eustachian tube dysfunction) 02/10/2018  . Peroneal tendinitis, right 01/06/2018  . Psoriasis 11/14/2017  . Acute bursitis of right shoulder 09/29/2017  . Bilateral hip pain 07/10/2017  . Bronchiectasis without complication ([place]) 73/53/2992  . Irritable larynx syndrome 04/25/2017  . Reflux laryngitis 04/23/2017  . Benign lipomatous neoplasm of skin and subcutaneous tissue of right leg 02/25/2017  . Anemia 02/05/2017  . Fatigue 01/23/2017  . Cough 01/02/2017  . Multiple pulmonary nodules 11/10/2016  . Chronic low back pain 10/09/2016  . Degenerative arthritis of right knee 06/20/2016  . Degenerative arthritis of left knee 04/10/2016  . Bronchitis 12/15/2015  . Peroneal tendinitis of left lower extremity 09/05/2015  . Recurrent falls 08/08/2015  . Bursitis of left shoulder 07/13/2015  . Morbid obesity due to excess calories ([place]) 04/16/2015  . Nonspecific abnormal electrocardiogram (ECG) (EKG) 05/11/2011  . Tenosynovitis of finger 04/22/2011  . Type 2 diabetes, uncontrolled, with neuropathy ([place]) 03/02/2010  . Hyperlipidemia associated with type 2 diabetes mellitus ([place]) 03/02/2010  . Depression with anxiety 03/02/2010  . Essential hypertension 03/02/2010  . Chronic rhinitis 03/02/2010  . Upper airway cough syndrome 03/02/2010  . GERD 03/02/2010    Carey Bullocks, OTR/L 08/08/2020, 9:24 AM  [facility] [address] [place], [place], [address] Phone: [phone]   Fax:  [phone]  Name: Heidi [place] MRN: [id number] Date of Birth: 07-Jan-1946

## 2020-08-11 ENCOUNTER — Ambulatory Visit: Payer: Medicare Other | Admitting: Occupational Therapy

## 2020-08-11 ENCOUNTER — Encounter: Payer: Self-pay | Admitting: Occupational Therapy

## 2020-08-11 ENCOUNTER — Other Ambulatory Visit: Payer: Self-pay

## 2020-08-11 DIAGNOSIS — M6281 Muscle weakness (generalized): Secondary | ICD-10-CM

## 2020-08-11 DIAGNOSIS — R208 Other disturbances of skin sensation: Secondary | ICD-10-CM

## 2020-08-11 DIAGNOSIS — M79642 Pain in left hand: Secondary | ICD-10-CM | POA: Diagnosis not present

## 2020-08-11 DIAGNOSIS — M79641 Pain in right hand: Secondary | ICD-10-CM | POA: Diagnosis not present

## 2020-08-11 NOTE — Therapy (Signed)
[facility] [address] [place], [place], [address] Phone: [phone]   Fax:  [phone]  Occupational Therapy Treatment  Patient Details  Name: Heidi [place] MRN: [id number] Date of Birth: 05/20/1946 Referring Provider (OT): Dr. Georgina Snell   Encounter Date: 08/11/2020   OT End of Session - 08/11/20 1111    Visit Number 8    Number of Visits 17    Date for OT Re-Evaluation 09/02/20    Authorization Type MCR    Authorization - Visit Number 8    Authorization - Number of Visits 10    Progress Note Due on Visit 10    OT Start Time 1104    OT Stop Time 1145    OT Time Calculation (min) 41 min    Activity Tolerance Patient tolerated treatment well    Behavior During Therapy [facility] for tasks assessed/performed           Past Medical History:  Diagnosis Date  . ALLERGIC RHINITIS   . Allergy   . Anxiety state, unspecified   . Arthritis   . Asthma   . BRONCHITIS, CHRONIC   . Chronic low back pain 10/09/2016  . DEPRESSION   . DIABETES MELLITUS, TYPE II   . GERD   . HYPERLIPIDEMIA   . HYPERTENSION   . Psoriasis     Past Surgical History:  Procedure Laterality Date  . ABDOMINAL HYSTERECTOMY  1980  . TRIGGER FINGER RELEASE     Index finger    There were no vitals filed for this visit.   Subjective Assessment - 08/11/20 1110    Subjective  Pt reports just a littlepain in RUE    Pertinent History Lt CTS, RT trigger finger of 4th and 5th digits. PMH: OA, DM, HTN, HLD    Currently in Pain? Yes    Pain Score 3     Pain Location Hand    Pain Orientation Right    Pain Descriptors / Indicators Aching    Pain Type Acute pain    Pain Onset More than a month ago    Aggravating Factors  use    Pain Relieving Factors rest                     Iontophoresis   Type of Iontophoresis Dexamethasone    Location Rt hand, at base of 3rd and 4th digits    Dose 1.5 cc at 2.2 mA current    Time 21 mins plus set  up= 24 mins, 1 small blister present at base of 4th/, 5th digit in palm, pt was instructed to monitor this.Pt was told that this happens frequently and it should resolve without issue.     LUE Paraffin   Number Minutes Paraffin 20 Minutes while receiving ionto on other hand   LUE Paraffin Location Hand;Wrist    Comments for pain and stiffness     Reveiwed tendon and nerve gliding exercises, no adverse reactions(pt performed exercises with LUE while ionto was on RUE (pt has exercises 1-3 for conservative CTS tx), min v.c and demonstration                OT Short Term Goals - 08/11/20 1121      OT SHORT TERM GOAL #1   Title Independent with pain management strategies including splinting, modalities, task modifications, and potential A/E needs for both hands    Time 4    Period Weeks    Status On-going  OT SHORT TERM GOAL #2   Title Pt to report less pain bilateral hands with functional tasks    Time 4    Period Weeks    Status Achieved      OT SHORT TERM GOAL #3   Title Pt to verbalize understanding with splint wear and care prn and A/E to make opening jars/containers easier    Time 4    Period Weeks    Status On-going      OT SHORT TERM GOAL #4   Title Independent with HEP for Lt CTS including median n. gliding ex's and for Rt hand ROM ex's    Time 4    Period Weeks    Status On-going             OT Long Term Goals - 08/11/20 1121      OT LONG TERM GOAL #1   Title Pt to verbalize understanding with strengthening HEP    Time 8    Period Weeks    Status On-going      OT LONG TERM GOAL #2   Title Grip strength to improve bilaterally by 10 lbs or greater    Baseline Rt = 26 lbs, Lt = 22 lbs    Time 8    Period Weeks    Status On-going      OT LONG TERM GOAL #3   Title Pt to improve 3 tip pinch by 3 lbs bilaterally    Baseline Rt = 6, Lt = 4 lbs.    Time 8    Period Weeks    Status On-going                  Patient will benefit from  skilled therapeutic intervention in order to improve the following deficits and impairments:           Visit Diagnosis: Pain in right hand  Pain in left hand  Muscle weakness (generalized)  Other disturbances of skin sensation    Problem List Patient Active Problem List   Diagnosis Date Noted  . Pedal edema 03/27/2020  . Toe ulcer ([place]) 03/10/2020  . History of chicken pox 04/11/2019  . Vitamin D deficiency 04/05/2019  . Lumbar radiculopathy 03/31/2019  . Wheezing 02/25/2019  . Neck pain 07/06/2018  . Trapezius muscle strain, right, initial encounter 06/23/2018  . Edema 06/23/2018  . Acute gout 04/08/2018  . Herpes simplex 02/10/2018  . ETD (eustachian tube dysfunction) 02/10/2018  . Peroneal tendinitis, right 01/06/2018  . Psoriasis 11/14/2017  . Acute bursitis of right shoulder 09/29/2017  . Bilateral hip pain 07/10/2017  . Bronchiectasis without complication ([place]) 24/26/8341  . Irritable larynx syndrome 04/25/2017  . Reflux laryngitis 04/23/2017  . Benign lipomatous neoplasm of skin and subcutaneous tissue of right leg 02/25/2017  . Anemia 02/05/2017  . Fatigue 01/23/2017  . Cough 01/02/2017  . Multiple pulmonary nodules 11/10/2016  . Chronic low back pain 10/09/2016  . Degenerative arthritis of right knee 06/20/2016  . Degenerative arthritis of left knee 04/10/2016  . Bronchitis 12/15/2015  . Peroneal tendinitis of left lower extremity 09/05/2015  . Recurrent falls 08/08/2015  . Bursitis of left shoulder 07/13/2015  . Morbid obesity due to excess calories ([place]) 04/16/2015  . Nonspecific abnormal electrocardiogram (ECG) (EKG) 05/11/2011  . Tenosynovitis of finger 04/22/2011  . Type 2 diabetes, uncontrolled, with neuropathy ([place]) 03/02/2010  . Hyperlipidemia associated with type 2 diabetes mellitus ([place]) 03/02/2010  . Depression with anxiety 03/02/2010  . Essential hypertension  03/02/2010  . Chronic rhinitis 03/02/2010  . Upper airway cough syndrome  03/02/2010  . GERD 03/02/2010    Shamila Lerch 08/11/2020, 1:35 PM  [facility] [address] [place], [place], [address] Phone: [phone]   Fax:  [phone]  Name: Heidi [place] MRN: [id number] Date of Birth: 11-09-1945

## 2020-08-15 ENCOUNTER — Ambulatory Visit: Payer: Medicare Other | Admitting: Occupational Therapy

## 2020-08-15 ENCOUNTER — Ambulatory Visit (INDEPENDENT_AMBULATORY_CARE_PROVIDER_SITE_OTHER): Payer: Medicare Other | Admitting: Internal Medicine

## 2020-08-15 ENCOUNTER — Encounter: Payer: Self-pay | Admitting: Internal Medicine

## 2020-08-15 ENCOUNTER — Other Ambulatory Visit: Payer: Self-pay

## 2020-08-15 VITALS — BP 132/58 | HR 92 | Ht 59.5 in | Wt 158.2 lb

## 2020-08-15 DIAGNOSIS — M79641 Pain in right hand: Secondary | ICD-10-CM | POA: Diagnosis not present

## 2020-08-15 DIAGNOSIS — K219 Gastro-esophageal reflux disease without esophagitis: Secondary | ICD-10-CM

## 2020-08-15 DIAGNOSIS — M6281 Muscle weakness (generalized): Secondary | ICD-10-CM | POA: Diagnosis not present

## 2020-08-15 DIAGNOSIS — R112 Nausea with vomiting, unspecified: Secondary | ICD-10-CM

## 2020-08-15 DIAGNOSIS — R208 Other disturbances of skin sensation: Secondary | ICD-10-CM | POA: Diagnosis not present

## 2020-08-15 DIAGNOSIS — M79642 Pain in left hand: Secondary | ICD-10-CM | POA: Diagnosis not present

## 2020-08-15 NOTE — Therapy (Signed)
[facility] [address] [place], [place], [address] Phone: [phone]   Fax:  [phone]  Occupational Therapy Treatment  Patient Details  Name: Heidi Franklin MRN: [id number] Date of Birth: [birth year] Referring Provider (OT): Dr. Georgina Snell   Encounter Date: 08/15/2020   OT End of Session - 08/15/20 1433    Visit Number 9    Number of Visits 17    Date for OT Re-Evaluation 09/02/20    Authorization Type MCR    Authorization - Visit Number 9    Authorization - Number of Visits 10    Progress Note Due on Visit 10    OT Start Time 1230    OT Stop Time 1315    OT Time Calculation (min) 45 min    Activity Tolerance Patient tolerated treatment well    Behavior During Therapy [facility] for tasks assessed/performed           Past Medical History:  Diagnosis Date  . ALLERGIC RHINITIS   . Allergy   . Anxiety state, unspecified   . Arthritis   . Asthma   . BRONCHITIS, CHRONIC   . Chronic low back pain 10/09/2016  . DEPRESSION   . DIABETES MELLITUS, TYPE II   . GERD   . HYPERLIPIDEMIA   . HYPERTENSION   . Pneumonia   . Psoriasis     Past Surgical History:  Procedure Laterality Date  . ABDOMINAL HYSTERECTOMY  1980  . CARPAL TUNNEL RELEASE Right   . TRIGGER FINGER RELEASE Left    Index finger    There were no vitals filed for this visit.   Subjective Assessment - 08/15/20 1235    Subjective  My Lt hand doesn't hurt, just tingling    Pertinent History Lt CTS, RT trigger finger of 4th and 5th digits. PMH: OA, DM, HTN, HLD    Currently in Pain? Yes    Pain Score 2     Pain Location Hand    Pain Orientation Right    Pain Descriptors / Indicators Sore    Pain Type Acute pain    Pain Onset More than a month ago    Pain Frequency Intermittent    Aggravating Factors  use    Pain Relieving Factors rest           Fluidotherapy Lt hand x 10 min to decrease stiffness.  Reviewed/assessed progress towards STG's -  reviewed pain management strategies and previously issued HEP's. Pt still required cueing for these. Added IP flexion ex for Rt hand blocking MP's in extension.  Grip Rt = 18.7 lbs (26 lbs at evaluation), Lt remains 22 lbs                     OT Education - 08/15/20 1307    Education Details Issued IP flexion ex for Rt hand, reviewed pain management strategies and STG's    Person(s) Educated Patient    Methods Explanation;Demonstration;Handout;Verbal cues    Comprehension Verbalized understanding;Returned demonstration;Verbal cues required;Need further instruction            OT Short Term Goals - 08/15/20 1309      OT SHORT TERM GOAL #1   Title Independent with pain management strategies including splinting, modalities, task modifications, and potential A/E needs for both hands    Time 4    Period Weeks    Status Achieved      OT SHORT TERM GOAL #2   Title Pt to report less  pain bilateral hands with functional tasks    Time 4    Period Weeks    Status Achieved      OT SHORT TERM GOAL #3   Title Pt to verbalize understanding with splint wear and care prn and A/E to make opening jars/containers easier    Time 4    Period Weeks    Status Achieved      OT SHORT TERM GOAL #4   Title Independent with HEP for Lt CTS including median n. gliding ex's and for Rt hand ROM ex's    Time 4    Period Weeks    Status Achieved             OT Long Term Goals - 08/15/20 1310      OT LONG TERM GOAL #1   Title Pt to verbalize understanding with strengthening HEP    Time 8    Period Weeks    Status On-going      OT LONG TERM GOAL #2   Title Grip strength to improve bilaterally by 10 lbs or greater    Baseline Rt = 26 lbs, Lt = 22 lbs    Time 8    Period Weeks    Status On-going   08/15/20: Rt = 18.7 lbs, Lt = 22 lbs     OT LONG TERM GOAL #3   Title Pt to improve 3 tip pinch by 3 lbs bilaterally    Baseline Rt = 6, Lt = 4 lbs.    Time 8    Period Weeks     Status On-going                 Plan - 08/15/20 1311    Clinical Impression Statement Pt has lost grip strength in Rt hand. Pt has met all STG's    OT Occupational Profile and History Problem Focused Assessment - Including review of records relating to presenting problem    Occupational performance deficits (Please refer to evaluation for details): IADL's;Leisure    Body Structure / Function / Physical Skills ADL;ROM;IADL;Edema;Sensation;Strength;Flexibility;Coordination;UE functional use;Pain    Rehab Potential Good    OT Frequency 2x / week    OT Duration 8 weeks    OT Treatment/Interventions Self-care/ADL training;Therapeutic exercise;Ultrasound;Manual Therapy;Splinting;Iontophoresis;Therapeutic activities;Paraffin;Cryotherapy;DME and/or AE instruction;Compression bandaging;Fluidtherapy;Electrical Stimulation;Scar mobilization;Moist Heat;Passive range of motion;Patient/family education;Contrast Bath    Plan 10th progress note, continue fluidotherapy, begin light strengthening with yellow putty bilaterally    Consulted and Agree with Plan of Care Patient           Patient will benefit from skilled therapeutic intervention in order to improve the following deficits and impairments:   Body Structure / Function / Physical Skills: ADL, ROM, IADL, Edema, Sensation, Strength, Flexibility, Coordination, UE functional use, Pain       Visit Diagnosis: Pain in right hand  Muscle weakness (generalized)  Other disturbances of skin sensation    Problem List Patient Active Problem List   Diagnosis Date Noted  . Pedal edema 03/27/2020  . Toe ulcer ([place]) 03/10/2020  . History of chicken pox 04/11/2019  . Vitamin D deficiency 04/05/2019  . Lumbar radiculopathy 03/31/2019  . Wheezing 02/25/2019  . Neck pain 07/06/2018  . Trapezius muscle strain, right, initial encounter 06/23/2018  . Edema 06/23/2018  . Acute gout 04/08/2018  . Herpes simplex 02/10/2018  . ETD (eustachian tube  dysfunction) 02/10/2018  . Peroneal tendinitis, right 01/06/2018  . Psoriasis 11/14/2017  . Acute bursitis of right shoulder 09/29/2017  .  Bilateral hip pain 07/10/2017  . Bronchiectasis without complication ([place]) 91/69/4503  . Irritable larynx syndrome 04/25/2017  . Reflux laryngitis 04/23/2017  . Benign lipomatous neoplasm of skin and subcutaneous tissue of right leg 02/25/2017  . Anemia 02/05/2017  . Fatigue 01/23/2017  . Cough 01/02/2017  . Multiple pulmonary nodules 11/10/2016  . Chronic low back pain 10/09/2016  . Degenerative arthritis of right knee 06/20/2016  . Degenerative arthritis of left knee 04/10/2016  . Bronchitis 12/15/2015  . Peroneal tendinitis of left lower extremity 09/05/2015  . Recurrent falls 08/08/2015  . Bursitis of left shoulder 07/13/2015  . Morbid obesity due to excess calories ([place]) 04/16/2015  . Nonspecific abnormal electrocardiogram (ECG) (EKG) 05/11/2011  . Tenosynovitis of finger 04/22/2011  . Type 2 diabetes, uncontrolled, with neuropathy ([place]) 03/02/2010  . Hyperlipidemia associated with type 2 diabetes mellitus ([place]) 03/02/2010  . Depression with anxiety 03/02/2010  . Essential hypertension 03/02/2010  . Chronic rhinitis 03/02/2010  . Upper airway cough syndrome 03/02/2010  . GERD 03/02/2010    Carey Bullocks, OTR/L 08/15/2020, 2:34 PM  [facility] [address] [place], [place], [address] Phone: [phone]   Fax:  [phone]  Name: Kelsa Jaworowski MRN: [id number] Date of Birth: 08/16/1946

## 2020-08-15 NOTE — Patient Instructions (Addendum)
If you are age 75 or older, your body mass index should be between 23-30. Your Body mass index is 31.43 kg/m. If this is out of the aforementioned range listed, please consider follow up with your Primary Care Provider.  If you are age 23 or younger, your body mass index should be between 19-25. Your Body mass index is 31.43 kg/m. If this is out of the aformentioned range listed, please consider follow up with your Primary Care Provider.     You have been scheduled for an endoscopy. Please follow written instructions given to you at your visit today. If you use inhalers (even only as needed), please bring them with you on the day of your procedure.  Please follow up in one year

## 2020-08-15 NOTE — Progress Notes (Signed)
HISTORY OF PRESENT ILLNESS:  Heidi Franklin is a 74 y.o. female with hypertension, hyperlipidemia, obesity, and longstanding diabetes mellitus.  She presents today for evaluation of nausea with vomiting.  She was last seen in this office August 2017 regarding GERD, gas bloat, and dyspepsia.  See that dictation.  Patient tells me that the past year or so she has had rare issues with regurgitation and vomiting in the evening after lying down.  Typically her last meal.  She does experience some heartburn.  She was changed from omeprazole to pantoprazole in August.  This is helped the burning.  However, regurgitation persists as does vomiting episodes of undigested food.  She does feel that she is lost a few pounds.  No bleeding.  Last upper endoscopy September 2017 to evaluate unexplained chest pain was normal.  Last colonoscopy 2015 was normal review of blood work from August 2021 shows normal CBC with hemoglobin 12.0.  Normal liver test.  Last hemoglobin A1c May 2021 was 6.5.  She has had diabetes for about 15 years.  She has completed her Covid vaccination series  REVIEW OF SYSTEMS:  All non-GI ROS negative otherwise stated in the HPI except for itching, muscle cramps, cough, fatigue, arthritis, back pain, anxiety  Past Medical History:  Diagnosis Date  . ALLERGIC RHINITIS   . Allergy   . Anxiety state, unspecified   . Arthritis   . Asthma   . BRONCHITIS, CHRONIC   . Chronic low back pain 10/09/2016  . DEPRESSION   . DIABETES MELLITUS, TYPE II   . GERD   . HYPERLIPIDEMIA   . HYPERTENSION   . Pneumonia   . Psoriasis     Past Surgical History:  Procedure Laterality Date  . ABDOMINAL HYSTERECTOMY  1980  . CARPAL TUNNEL RELEASE Right   . TRIGGER FINGER RELEASE Left    Index finger    Social History Heidi Franklin  reports that she has never smoked. She has never used smokeless tobacco. She reports that she does not drink alcohol and does not use drugs.  family history includes Angina in  her mother; Arthritis in her sister; CAD in her sister and sister; COPD in her sister; Depression in her mother; Diabetes in her brother, brother, father, and sister; Heart disease in her mother; Hyperlipidemia in her brother, brother, mother, sister, sister, and another family member; Hypertension in her brother, brother, mother, sister, sister, and another family member; Stroke in her mother.  Allergies  Allergen Reactions  . Metformin And Related Diarrhea  . Penicillins Rash       PHYSICAL EXAMINATION: Vital signs: BP (!) 132/58 (BP Location: Left Arm, Patient Position: Sitting, Cuff Size: Normal)   Pulse 92   Ht 4' 11.5" (1.511 m) Comment: height measured without shoes  Wt 158 lb 4 oz (71.8 kg)   BMI 31.43 kg/m   Constitutional: generally well-appearing, no acute distress Psychiatric: alert and oriented x3, cooperative Eyes: extraocular movements intact, anicteric, conjunctiva pink Mouth: oral pharynx moist, no lesions Neck: supple no lymphadenopathy Cardiovascular: heart regular rate and rhythm, no murmur Lungs: clear to auscultation bilaterally Abdomen: soft, nontender, nondistended, no obvious ascites, no peritoneal signs, normal bowel sounds, no organomegaly.  No succussion splash Rectal: Omitted Extremities: no clubbing, cyanosis, or lower extremity edema bilaterally Skin: no lesions on visible extremities Neuro: No focal deficits.  Cranial nerves intact  ASSESSMENT:  1.  Intermittent problems with regurgitation and vomiting as described.  Concerned about diabetic gastroparesis. 2.  Chronic GERD.  Recently  exacerbated.  Change from omeprazole to pantoprazole.  May need twice daily therapy 3.  Obesity 4.  Multiple medical problems.  Diabetes under good control 5.  Colonoscopy 2015 was unremarkable.   PLAN:  1.  Reflux precautions 2.  Continue pantoprazole 3.  Schedule upper endoscopy to evaluate recurrent problems with regurgitation of vomiting.  Patient is high  risk given her body habitus and comorbidities.  Sedation will be provided via monitored anesthesia care.The nature of the procedure, as well as the risks, benefits, and alternatives were carefully and thoroughly reviewed with the patient. Ample time for discussion and questions allowed. The patient understood, was satisfied, and agreed to proceed. 4.  If upper endoscopy unrevealing, then schedule solid-phase gastric emptying scan to rule out gastroparesis 5.  Recommendations thereafter

## 2020-08-15 NOTE — Patient Instructions (Signed)
PIP Flexion (Active)    Rt hand: Keeping large knuckles straight, bend the middle joint of __all____ fingers (hook fingers) as far as possible. Hold _3___ seconds. Then straighten fingers fully Repeat _10___ times. Do __2-3__ sessions per day.

## 2020-08-17 ENCOUNTER — Other Ambulatory Visit: Payer: Self-pay

## 2020-08-17 ENCOUNTER — Ambulatory Visit: Payer: Medicare Other | Admitting: Occupational Therapy

## 2020-08-17 DIAGNOSIS — M6281 Muscle weakness (generalized): Secondary | ICD-10-CM | POA: Diagnosis not present

## 2020-08-17 DIAGNOSIS — M79641 Pain in right hand: Secondary | ICD-10-CM | POA: Diagnosis not present

## 2020-08-17 DIAGNOSIS — M79642 Pain in left hand: Secondary | ICD-10-CM | POA: Diagnosis not present

## 2020-08-17 DIAGNOSIS — R208 Other disturbances of skin sensation: Secondary | ICD-10-CM | POA: Diagnosis not present

## 2020-08-17 NOTE — Patient Instructions (Signed)
DO BELOW PUTTY EXERCISES FOR BOTH HANDS. DO NOT EXCEED RECOMMENDED FREQUENCY. STRETCH HANDS AFTERWARDS  1. Grip Strengthening (Resistive Putty)   Squeeze putty using thumb and all fingers. Repeat _15-20___ times. Do __2__ sessions per day.   2. Roll putty into tube on table and pinch between first two fingers and thumb x 10 reps. Do 2 sessions per day    Copyright  VHI. All rights reserved.

## 2020-08-17 NOTE — Therapy (Signed)
[facility] [address] [place], [place], [address] Phone: [phone]   Fax:  [phone]  Occupational Therapy Treatment  Patient Details  Name: Heidi Franklin MRN: [id number] Date of Birth: [birth year] Referring Provider (OT): Dr. Georgina Snell   Encounter Date: 08/17/2020   OT End of Session - 08/17/20 1355    Visit Number 10    Number of Visits 17    Date for OT Re-Evaluation 09/02/20    Authorization Type MCR    Authorization - Visit Number 10    Authorization - Number of Visits 10    Progress Note Due on Visit 10    OT Start Time 1315    OT Stop Time 1355    OT Time Calculation (min) 40 min    Activity Tolerance Patient tolerated treatment well    Behavior During Therapy [facility] for tasks assessed/performed           Past Medical History:  Diagnosis Date  . ALLERGIC RHINITIS   . Allergy   . Anxiety state, unspecified   . Arthritis   . Asthma   . BRONCHITIS, CHRONIC   . Chronic low back pain 10/09/2016  . DEPRESSION   . DIABETES MELLITUS, TYPE II   . GERD   . HYPERLIPIDEMIA   . HYPERTENSION   . Pneumonia   . Psoriasis     Past Surgical History:  Procedure Laterality Date  . ABDOMINAL HYSTERECTOMY  1980  . CARPAL TUNNEL RELEASE Right   . TRIGGER FINGER RELEASE Left    Index finger    There were no vitals filed for this visit.   Subjective Assessment - 08/17/20 1330    Subjective  I don't have pain today    Pertinent History Lt CTS, RT trigger finger of 4th and 5th digits. PMH: OA, DM, HTN, HLD    Currently in Pain? No/denies    Pain Onset More than a month ago                        OT Treatments/Exercises (OP) - 08/17/20 0001      ADLs   ADL Comments Reviewed pain management strategies including use of modalities, task modifications (use of A/E and making handles larger when possible)       Hand Exercises   Other Hand Exercises Pt issued putty HEP - see pt instructions for  details. Pt performed each as indicated    Other Hand Exercises Reviewed hook IP flex ex's for Rt hand and full composite extension stretch for both hands.       LUE Fluidotherapy   Number Minutes Fluidotherapy 10 Minutes    LUE Fluidotherapy Location Hand;Wrist    Comments to decrease stiffness                  OT Education - 08/17/20 1339    Education Details Putty HEP    Person(s) Educated Patient    Methods Explanation;Demonstration;Handout;Verbal cues    Comprehension Verbalized understanding;Returned demonstration            OT Short Term Goals - 08/15/20 1309      OT SHORT TERM GOAL #1   Title Independent with pain management strategies including splinting, modalities, task modifications, and potential A/E needs for both hands    Time 4    Period Weeks    Status Achieved      OT SHORT TERM GOAL #2   Title Pt to report less pain  bilateral hands with functional tasks    Time 4    Period Weeks    Status Achieved      OT SHORT TERM GOAL #3   Title Pt to verbalize understanding with splint wear and care prn and A/E to make opening jars/containers easier    Time 4    Period Weeks    Status Achieved      OT SHORT TERM GOAL #4   Title Independent with HEP for Lt CTS including median n. gliding ex's and for Rt hand ROM ex's    Time 4    Period Weeks    Status Achieved             OT Long Term Goals - 08/17/20 1356      OT LONG TERM GOAL #1   Title Pt to verbalize understanding with strengthening HEP    Time 8    Period Weeks    Status Achieved      OT LONG TERM GOAL #2   Title Grip strength to improve bilaterally by 10 lbs or greater    Baseline Rt = 26 lbs, Lt = 22 lbs    Time 8    Period Weeks    Status On-going   08/15/20: Rt = 18.7 lbs, Lt = 22 lbs     OT LONG TERM GOAL #3   Title Pt to improve 3 tip pinch by 3 lbs bilaterally    Baseline Rt = 6, Lt = 4 lbs.    Time 8    Period Weeks    Status On-going                 Plan -  08/17/20 1356    Clinical Impression Statement This 10th progress note is for dates 07/04/20 - 08/17/20: Pt has met all STG's and 1 LTG at this time. Pt has lost grip strength Rt hand secondary to letting pain subside before beginning strengthening (started today)    OT Occupational Profile and History Problem Focused Assessment - Including review of records relating to presenting problem    Occupational performance deficits (Please refer to evaluation for details): IADL's;Leisure    Body Structure / Function / Physical Skills ADL;ROM;IADL;Edema;Sensation;Strength;Flexibility;Coordination;UE functional use;Pain    Rehab Potential Good    OT Frequency 2x / week    OT Duration 8 weeks    OT Treatment/Interventions Self-care/ADL training;Therapeutic exercise;Ultrasound;Manual Therapy;Splinting;Iontophoresis;Therapeutic activities;Paraffin;Cryotherapy;DME and/or AE instruction;Compression bandaging;Fluidtherapy;Electrical Stimulation;Scar mobilization;Moist Heat;Passive range of motion;Patient/family education;Contrast Bath    Plan continue fluidotherapy Lt hand, continue light bilateral hand strengthening, ice Rt hand prn, add coban to medium sized trigger finger splint to reduce extra space   Consulted and Agree with Plan of Care Patient           Patient will benefit from skilled therapeutic intervention in order to improve the following deficits and impairments:   Body Structure / Function / Physical Skills: ADL, ROM, IADL, Edema, Sensation, Strength, Flexibility, Coordination, UE functional use, Pain       Visit Diagnosis: Muscle weakness (generalized)    Problem List Patient Active Problem List   Diagnosis Date Noted  . Pedal edema 03/27/2020  . Toe ulcer ([place]) 03/10/2020  . History of chicken pox 04/11/2019  . Vitamin D deficiency 04/05/2019  . Lumbar radiculopathy 03/31/2019  . Wheezing 02/25/2019  . Neck pain 07/06/2018  . Trapezius muscle strain, right, initial encounter  06/23/2018  . Edema 06/23/2018  . Acute gout 04/08/2018  . Herpes simplex 02/10/2018  .  ETD (eustachian tube dysfunction) 02/10/2018  . Peroneal tendinitis, right 01/06/2018  . Psoriasis 11/14/2017  . Acute bursitis of right shoulder 09/29/2017  . Bilateral hip pain 07/10/2017  . Bronchiectasis without complication ([place]) 37/49/6646  . Irritable larynx syndrome 04/25/2017  . Reflux laryngitis 04/23/2017  . Benign lipomatous neoplasm of skin and subcutaneous tissue of right leg 02/25/2017  . Anemia 02/05/2017  . Fatigue 01/23/2017  . Cough 01/02/2017  . Multiple pulmonary nodules 11/10/2016  . Chronic low back pain 10/09/2016  . Degenerative arthritis of right knee 06/20/2016  . Degenerative arthritis of left knee 04/10/2016  . Bronchitis 12/15/2015  . Peroneal tendinitis of left lower extremity 09/05/2015  . Recurrent falls 08/08/2015  . Bursitis of left shoulder 07/13/2015  . Morbid obesity due to excess calories ([place]) 04/16/2015  . Nonspecific abnormal electrocardiogram (ECG) (EKG) 05/11/2011  . Tenosynovitis of finger 04/22/2011  . Type 2 diabetes, uncontrolled, with neuropathy ([place]) 03/02/2010  . Hyperlipidemia associated with type 2 diabetes mellitus ([place]) 03/02/2010  . Depression with anxiety 03/02/2010  . Essential hypertension 03/02/2010  . Chronic rhinitis 03/02/2010  . Upper airway cough syndrome 03/02/2010  . GERD 03/02/2010    Carey Bullocks, OTR/L 08/17/2020, 1:58 PM  [facility] [address] [place], [place], [address] Phone: [phone]   Fax:  [phone]  Name: Leatta Alewine MRN: [id number] Date of Birth: 1946-10-02

## 2020-08-18 DIAGNOSIS — L400 Psoriasis vulgaris: Secondary | ICD-10-CM | POA: Diagnosis not present

## 2020-08-21 ENCOUNTER — Other Ambulatory Visit: Payer: Self-pay

## 2020-08-21 ENCOUNTER — Other Ambulatory Visit: Payer: Self-pay | Admitting: Family Medicine

## 2020-08-21 MED ORDER — LOSARTAN POTASSIUM 100 MG PO TABS
100.0000 mg | ORAL_TABLET | Freq: Every day | ORAL | 0 refills | Status: DC
Start: 2020-08-21 — End: 2021-03-05

## 2020-08-21 NOTE — Telephone Encounter (Signed)
hctz was dc'd on 03/23/20 but was refilled on 05/25/20 is this a med that can be refilled for pt? Refill request was just received in office

## 2020-08-21 NOTE — Telephone Encounter (Signed)
Clarify with patient what she is taking looks like she should just be taking the Losartan 100 mg from [place]. If she is unclear what she is taking arrange a nurse visit for blood pressure check and have her bring alll of her meds for a check to make sure she is not missing or duplicating anything.

## 2020-08-22 ENCOUNTER — Other Ambulatory Visit: Payer: Self-pay

## 2020-08-22 ENCOUNTER — Ambulatory Visit: Payer: Medicare Other | Admitting: Occupational Therapy

## 2020-08-22 DIAGNOSIS — M79641 Pain in right hand: Secondary | ICD-10-CM | POA: Diagnosis not present

## 2020-08-22 DIAGNOSIS — M79642 Pain in left hand: Secondary | ICD-10-CM | POA: Diagnosis not present

## 2020-08-22 DIAGNOSIS — M6281 Muscle weakness (generalized): Secondary | ICD-10-CM | POA: Diagnosis not present

## 2020-08-22 DIAGNOSIS — R208 Other disturbances of skin sensation: Secondary | ICD-10-CM

## 2020-08-22 NOTE — Therapy (Signed)
[facility] [address] [place], [place], [address] Phone: [phone]   Fax:  [phone]  Occupational Therapy Treatment  Patient Details  Name: Heidi Franklin MRN: [id number] Date of Birth: 02-Jul-1946 Referring Provider (OT): Dr. Georgina Snell   Encounter Date: 08/22/2020   OT End of Session - 08/22/20 1256    Visit Number 11    Number of Visits 17    Date for OT Re-Evaluation 09/02/20    Authorization Type MCR    Authorization - Visit Number 11    Authorization - Number of Visits 20    Progress Note Due on Visit 20    OT Start Time 9211    OT Stop Time 1315    OT Time Calculation (min) 40 min    Activity Tolerance Patient tolerated treatment well    Behavior During Therapy [facility] for tasks assessed/performed           Past Medical History:  Diagnosis Date   ALLERGIC RHINITIS    Allergy    Anxiety state, unspecified    Arthritis    Asthma    BRONCHITIS, CHRONIC    Chronic low back pain 10/09/2016   DEPRESSION    DIABETES MELLITUS, TYPE II    GERD    HYPERLIPIDEMIA    HYPERTENSION    Pneumonia    Psoriasis     Past Surgical History:  Procedure Laterality Date   ABDOMINAL HYSTERECTOMY  1980   CARPAL TUNNEL RELEASE Right    TRIGGER FINGER RELEASE Left    Index finger    There were no vitals filed for this visit.   Subjective Assessment - 08/22/20 1246    Subjective  Just numbness in my Lt hand    Pertinent History Lt CTS, RT trigger finger of 4th and 5th digits. PMH: OA, DM, HTN, HLD    Currently in Pain? Yes    Pain Score 2     Pain Location Hand    Pain Orientation Right    Pain Descriptors / Indicators Sore    Pain Type Acute pain    Pain Onset More than a month ago    Pain Frequency Intermittent    Aggravating Factors  use    Pain Relieving Factors rest                        OT Treatments/Exercises (OP) - 08/22/20 0001      ADLs   ADL Comments Adpated  trigger finger splint by adding coban around splint.       Hand Exercises   Other Hand Exercises Reviewed putty HEP - Pt performed each as indicated bilaterally with cues to perform correctly      Modalities   Modalities Cryotherapy      Cryotherapy   Number Minutes Cryotherapy 7 Minutes    Cryotherapy Location Hand   base of 3rd and 4th digit volarly   Type of Cryotherapy Ice massage      LUE Fluidotherapy   Number Minutes Fluidotherapy 10 Minutes    LUE Fluidotherapy Location Hand;Wrist    Comments to help manage numbness Lt hand                    OT Short Term Goals - 08/15/20 1309      OT SHORT TERM GOAL #1   Title Independent with pain management strategies including splinting, modalities, task modifications, and potential A/E needs for both hands  Time 4    Period Weeks    Status Achieved      OT SHORT TERM GOAL #2   Title Pt to report less pain bilateral hands with functional tasks    Time 4    Period Weeks    Status Achieved      OT SHORT TERM GOAL #3   Title Pt to verbalize understanding with splint wear and care prn and A/E to make opening jars/containers easier    Time 4    Period Weeks    Status Achieved      OT SHORT TERM GOAL #4   Title Independent with HEP for Lt CTS including median n. gliding ex's and for Rt hand ROM ex's    Time 4    Period Weeks    Status Achieved             OT Long Term Goals - 08/17/20 1356      OT LONG TERM GOAL #1   Title Pt to verbalize understanding with strengthening HEP    Time 8    Period Weeks    Status Achieved      OT LONG TERM GOAL #2   Title Grip strength to improve bilaterally by 10 lbs or greater    Baseline Rt = 26 lbs, Lt = 22 lbs    Time 8    Period Weeks    Status On-going   08/15/20: Rt = 18.7 lbs, Lt = 22 lbs     OT LONG TERM GOAL #3   Title Pt to improve 3 tip pinch by 3 lbs bilaterally    Baseline Rt = 6, Lt = 4 lbs.    Time 8    Period Weeks    Status On-going                  Plan - 08/22/20 1317    Clinical Impression Statement Pt gradually improving but remains limited in Rt hand strength and pt continues to report mild soreness Rt hand. Numbness continues Lt hand    OT Occupational Profile and History Problem Focused Assessment - Including review of records relating to presenting problem    Occupational performance deficits (Please refer to evaluation for details): IADL's;Leisure    Body Structure / Function / Physical Skills ADL;ROM;IADL;Edema;Sensation;Strength;Flexibility;Coordination;UE functional use;Pain    Rehab Potential Good    OT Frequency 2x / week    OT Duration 8 weeks    OT Treatment/Interventions Self-care/ADL training;Therapeutic exercise;Ultrasound;Manual Therapy;Splinting;Iontophoresis;Therapeutic activities;Paraffin;Cryotherapy;DME and/or AE instruction;Compression bandaging;Fluidtherapy;Electrical Stimulation;Scar mobilization;Moist Heat;Passive range of motion;Patient/family education;Contrast Bath    Plan continue fluidotherapy Lt hand, continue light bilateral hand strengthening, ice Rt hand prn, anticipate d/c by 08/31/20    Consulted and Agree with Plan of Care Patient           Patient will benefit from skilled therapeutic intervention in order to improve the following deficits and impairments:   Body Structure / Function / Physical Skills: ADL, ROM, IADL, Edema, Sensation, Strength, Flexibility, Coordination, UE functional use, Pain       Visit Diagnosis: Muscle weakness (generalized)  Pain in right hand  Other disturbances of skin sensation    Problem List Patient Active Problem List   Diagnosis Date Noted   Pedal edema 03/27/2020   Toe ulcer ([place]) 03/10/2020   History of chicken pox 04/11/2019   Vitamin D deficiency 04/05/2019   Lumbar radiculopathy 03/31/2019   Wheezing 02/25/2019   Neck pain 07/06/2018   Trapezius muscle strain,  right, initial encounter 06/23/2018   Edema 06/23/2018    Acute gout 04/08/2018   Herpes simplex 02/10/2018   ETD (eustachian tube dysfunction) 02/10/2018   Peroneal tendinitis, right 01/06/2018   Psoriasis 11/14/2017   Acute bursitis of right shoulder 09/29/2017   Bilateral hip pain 07/10/2017   Bronchiectasis without complication ([place]) 84/09/8207   Irritable larynx syndrome 04/25/2017   Reflux laryngitis 04/23/2017   Benign lipomatous neoplasm of skin and subcutaneous tissue of right leg 02/25/2017   Anemia 02/05/2017   Fatigue 01/23/2017   Cough 01/02/2017   Multiple pulmonary nodules 11/10/2016   Chronic low back pain 10/09/2016   Degenerative arthritis of right knee 06/20/2016   Degenerative arthritis of left knee 04/10/2016   Bronchitis 12/15/2015   Peroneal tendinitis of left lower extremity 09/05/2015   Recurrent falls 08/08/2015   Bursitis of left shoulder 07/13/2015   Morbid obesity due to excess calories ([place]) 04/16/2015   Nonspecific abnormal electrocardiogram (ECG) (EKG) 05/11/2011   Tenosynovitis of finger 04/22/2011   Type 2 diabetes, uncontrolled, with neuropathy ([place]) 03/02/2010   Hyperlipidemia associated with type 2 diabetes mellitus ([place]) 03/02/2010   Depression with anxiety 03/02/2010   Essential hypertension 03/02/2010   Chronic rhinitis 03/02/2010   Upper airway cough syndrome 03/02/2010   GERD 03/02/2010    Carey Bullocks, OTR/L 08/22/2020, 2:42 PM  [place] [address] [place], [place], [address] Phone: [phone]   Fax:  [phone]  Name: Heidi Franklin MRN: [id number] Date of Birth: [birth year]

## 2020-08-23 ENCOUNTER — Telehealth (INDEPENDENT_AMBULATORY_CARE_PROVIDER_SITE_OTHER): Payer: Medicare Other | Admitting: Internal Medicine

## 2020-08-23 ENCOUNTER — Encounter: Payer: Self-pay | Admitting: Internal Medicine

## 2020-08-23 VITALS — BP 105/56 | Ht 59.5 in | Wt 158.0 lb

## 2020-08-23 DIAGNOSIS — J471 Bronchiectasis with (acute) exacerbation: Secondary | ICD-10-CM

## 2020-08-23 MED ORDER — AZITHROMYCIN 250 MG PO TABS: ORAL_TABLET | ORAL | 0 refills | Status: DC

## 2020-08-23 MED ORDER — HYDROCODONE-HOMATROPINE 5-1.5 MG/5ML PO SYRP
5.0000 mL | ORAL_SOLUTION | Freq: Two times a day (BID) | ORAL | 0 refills | Status: DC | PRN
Start: 2020-08-23 — End: 2020-12-25

## 2020-08-23 NOTE — Progress Notes (Signed)
Pre visit review using our clinic review tool, if applicable. No additional management support is needed unless otherwise documented below in the visit note. 

## 2020-08-23 NOTE — Progress Notes (Signed)
Subjective:    Patient ID: Heidi Franklin, female    DOB: September 03, 1946, 74 y.o.   MRN: [id number]  DOS:  08/23/2020 Type of visit - description: Virtual Visit via Telephone    I connected with above mentioned patient  by telephone and verified that I am speaking with the correct person using two identifiers.  THIS ENCOUNTER IS A VIRTUAL VISIT DUE TO COVID-19 - PATIENT WAS NOT SEEN IN THE OFFICE. PATIENT HAS CONSENTED TO VIRTUAL VISIT / TELEMEDICINE VISIT   Location of patient: home  Location of provider: office  Persons participating in the virtual visit: patient, provider   I discussed the limitations, risks, security and privacy concerns of performing an evaluation and management service by telephone and the availability of in person appointments. I also discussed with the patient that there may be a patient responsible charge related to this service. The patient expressed understanding and agreed to proceed.  Acute The patient has a history of bronchiectasis and chronic cough, she feels like her chronic cough is slightly more noticeable in the last week. She is concerned because she will have a EGD next week. Denies sputum production. Tessalon Perles are not helping. Delsym and hydrocodone syrup are helping to some extent    BP Readings from Last 3 Encounters:  08/23/20 (!) 105/56  08/15/20 (!) 132/58  06/22/20 114/72    Review of Systems Denies fever chills No nausea, vomiting, diarrhea No chest pain no difficulty breathing She does have wheezing on and off, using albuterol twice a day which is slightly more than her usual.  Past Medical History:  Diagnosis Date  . ALLERGIC RHINITIS   . Allergy   . Anxiety state, unspecified   . Arthritis   . Asthma   . BRONCHITIS, CHRONIC   . Chronic low back pain 10/09/2016  . DEPRESSION   . DIABETES MELLITUS, TYPE II   . GERD   . HYPERLIPIDEMIA   . HYPERTENSION   . Pneumonia   . Psoriasis     Past Surgical History:    Procedure Laterality Date  . ABDOMINAL HYSTERECTOMY  1980  . CARPAL TUNNEL RELEASE Right   . TRIGGER FINGER RELEASE Left    Index finger    Allergies as of 08/23/2020      Reactions   Metformin And Related Diarrhea   Penicillins Rash      Medication List       Accurate as of August 23, 2020 11:59 PM. If you have any questions, ask your nurse or doctor.        STOP taking these medications   losartan-hydrochlorothiazide 100-12.5 MG tablet Commonly known as: HYZAAR Stopped by: Kathlene November, MD     TAKE these medications   acetaminophen 325 MG tablet Commonly known as: TYLENOL Take 650 mg by mouth every 6 (six) hours as needed.   albuterol 108 (90 Base) MCG/ACT inhaler Commonly known as: VENTOLIN HFA INHALE 1-2 PUFFS INTO THE LUNGS EVERY 6 (SIX) HOURS AS NEEDED FOR WHEEZING OR SHORTNESS OF BREATH.   ALPRAZolam 0.5 MG tablet Commonly known as: XANAX TAKE 1 TABLET BY MOUTH EVERY 8 HOURS IF NEEDED FOR ANXIETY.   amLODipine 10 MG tablet Commonly known as: NORVASC TAKE 1 TABLET BY MOUTH EVERY DAY   azithromycin 250 MG tablet Commonly known as: Zithromax Z-Pak 2 tabs a day the first day, then 1 tab a day x 4 days Started by: Kathlene November, MD   benzonatate 100 MG capsule Commonly known as: TESSALON  Take 1 capsule (100 mg total) by mouth 2 (two) times daily as needed for cough.   betamethasone dipropionate 0.05 % ointment Commonly known as: DIPROLENE Apply to affected areas twice a day and then as needed flare/itch   CENTRUM SILVER PO Take 1 tablet by mouth every morning.   fluticasone 0.05 % cream Commonly known as: CUTIVATE APPLY TO AFFECTED AREA TWICE A DAY 1 WEEK ON 1 WEEK OFF PRN FLARE   furosemide 20 MG tablet Commonly known as: LASIX TAKE 1 TABLET (20 MG TOTAL) BY MOUTH DAILY AS NEEDED FOR EDEMA.   gabapentin 100 MG capsule Commonly known as: NEURONTIN TAKE 2 CAPSULES BY MOUTH AT BEDTIME.   glimepiride 1 MG tablet Commonly known as: AMARYL Take 1 tablet  (1 mg total) by mouth daily with breakfast.   glucose blood test strip Commonly known as: ONE TOUCH ULTRA TEST 1 each by Other route daily. Use 1 strips to check blood sugar twice a day Ex E11.9   Humira Pen-Ps/UV/Adol HS Start 40 MG/0.8ML Pnkt Generic drug: Adalimumab Every 2 weeks   hydrochlorothiazide 12.5 MG capsule Commonly known as: MICROZIDE Take 12.5 mg by mouth daily.   HYDROcodone-homatropine 5-1.5 MG/5ML syrup Commonly known as: HYCODAN Take 5 mLs by mouth 2 (two) times daily as needed for cough.   losartan 100 MG tablet Commonly known as: COZAAR Take 1 tablet (100 mg total) by mouth daily.   lovastatin 20 MG tablet Commonly known as: MEVACOR Take 1 tablet (20 mg total) by mouth at bedtime.   mometasone 0.1 % ointment Commonly known as: ELOCON   montelukast 10 MG tablet Commonly known as: SINGULAIR TAKE 1 TABLET BY MOUTH EVERYDAY AT BEDTIME   onetouch ultrasoft lancets PRN   pantoprazole 40 MG tablet Commonly known as: PROTONIX Take 1 tablet (40 mg total) by mouth daily.   Potassium Chloride ER 20 MEQ Tbcr TAKE 1 TABLET BY MOUTH EVERY DAY   valACYclovir 500 MG tablet Commonly known as: VALTREX Take 500 mg by mouth 2 (two) times daily.   Vitamin D3 50 MCG (2000 UT) capsule Take 2,000 Units by mouth daily.          Objective:   Physical Exam BP (!) 105/56   Ht 4' 11.5" (1.511 m)   Wt 158 lb (71.7 kg)   BMI 31.38 kg/m  This is telephone visit, she was unable to contact us via video despite multiple attempts.  She sounded well, no cough during the phone call, speaking in complete sentences.    Assessment    74 year old female, PMH includes well-controlled diabetes, normal kidney function, high cholesterol, hypertension,Immunosuppressed with Humira due to psoriasis, History of bronchiectasis, presents with:  Bronchiectasis exacerbation The patient has history of bronchiectasis with increased cough and wheezing lately. She reports no  fever. She had 2 moderna vaccinations. Plan: Check for Covid and notify the office with the results. Z-Pak, change Delsym to Robitussin-DM, continue hydrocodone as needed for cough, new Rx sent. Definitely let me know if not improving. HTN: Slightly low today, she does not have a septic picture, sounded well on the phone.  Recommend to continue monitor BPs and notify the office if BP remains low. Patient verbalized understanding.   I discussed the assessment and treatment plan with the patient. The patient was provided an opportunity to ask questions and all were answered. The patient agreed with the plan and demonstrated an understanding of the instructions.   The patient was advised to call back or seek an in-person  evaluation if the symptoms worsen or if the condition fails to improve as anticipated.  I provided 23 minutes of non-face-to-face time during this encounter.  Kathlene November, MD

## 2020-08-24 ENCOUNTER — Ambulatory Visit: Payer: Medicare Other | Admitting: Occupational Therapy

## 2020-08-24 ENCOUNTER — Other Ambulatory Visit: Payer: Medicare Other

## 2020-08-25 DIAGNOSIS — Z20822 Contact with and (suspected) exposure to covid-19: Secondary | ICD-10-CM | POA: Diagnosis not present

## 2020-08-28 ENCOUNTER — Ambulatory Visit (AMBULATORY_SURGERY_CENTER): Payer: Medicare Other | Admitting: Internal Medicine

## 2020-08-28 ENCOUNTER — Other Ambulatory Visit: Payer: Self-pay

## 2020-08-28 ENCOUNTER — Encounter: Payer: Self-pay | Admitting: Internal Medicine

## 2020-08-28 ENCOUNTER — Telehealth: Payer: Self-pay

## 2020-08-28 VITALS — BP 123/62 | HR 86 | Temp 97.1°F | Resp 15 | Ht 59.5 in | Wt 158.0 lb

## 2020-08-28 DIAGNOSIS — R112 Nausea with vomiting, unspecified: Secondary | ICD-10-CM | POA: Diagnosis not present

## 2020-08-28 DIAGNOSIS — K317 Polyp of stomach and duodenum: Secondary | ICD-10-CM | POA: Diagnosis not present

## 2020-08-28 DIAGNOSIS — K219 Gastro-esophageal reflux disease without esophagitis: Secondary | ICD-10-CM | POA: Diagnosis not present

## 2020-08-28 DIAGNOSIS — K449 Diaphragmatic hernia without obstruction or gangrene: Secondary | ICD-10-CM

## 2020-08-28 MED ORDER — SODIUM CHLORIDE 0.9 % IV SOLN: 500.0000 mL | INTRAVENOUS | Status: DC

## 2020-08-28 NOTE — Patient Instructions (Signed)
Resume previous diet  continue current medications Schedule solid - phase gastric emptying scan   YOU HAD AN ENDOSCOPIC PROCEDURE TODAY AT [facility]:   Refer to the procedure report that was given to you for any specific questions about what was found during the examination.  If the procedure report does not answer your questions, please call your gastroenterologist to clarify.  If you requested that your care partner not be given the details of your procedure findings, then the procedure report has been included in a sealed envelope for you to review at your convenience later.  YOU SHOULD EXPECT: Some feelings of bloating in the abdomen. Passage of more gas than usual.  Walking can help get rid of the air that was put into your GI tract during the procedure and reduce the bloating. If you had a lower endoscopy (such as a colonoscopy or flexible sigmoidoscopy) you may notice spotting of blood in your stool or on the toilet paper. If you underwent a bowel prep for your procedure, you may not have a normal bowel movement for a few days.  Please Note:  You might notice some irritation and congestion in your nose or some drainage.  This is from the oxygen used during your procedure.  There is no need for concern and it should clear up in a day or so.  SYMPTOMS TO REPORT IMMEDIATELY:   Following upper endoscopy (EGD)  Vomiting of blood or coffee ground material  New chest pain or pain under the shoulder blades  Painful or persistently difficult swallowing  New shortness of breath  Fever of 100F or higher  Black, tarry-looking stools  For urgent or emergent issues, a gastroenterologist can be reached at any hour by calling [phone]. Do not use MyChart messaging for urgent concerns.    DIET:  We do recommend a small meal at first, but then you may proceed to your regular diet.  Drink plenty of fluids but you should avoid alcoholic beverages for 24 hours.  ACTIVITY:  You  should plan to take it easy for the rest of today and you should NOT DRIVE or use heavy machinery until tomorrow (because of the sedation medicines used during the test).    FOLLOW UP: Our staff will call the number listed on your records 48-72 hours following your procedure to check on you and address any questions or concerns that you may have regarding the information given to you following your procedure. If we do not reach you, we will leave a message.  We will attempt to reach you two times.  During this call, we will ask if you have developed any symptoms of COVID 19. If you develop any symptoms (ie: fever, flu-like symptoms, shortness of breath, cough etc.) before then, please call [phone].  If you test positive for Covid 19 in the 2 weeks post procedure, please call and report this information to [place].    If any biopsies were taken you will be contacted by phone or by letter within the next 1-3 weeks.  Please call us at [phone] if you have not heard about the biopsies in 3 weeks.    SIGNATURES/CONFIDENTIALITY: You and/or your care partner have signed paperwork which will be entered into your electronic medical record.  These signatures attest to the fact that that the information above on your After Visit Summary has been reviewed and is understood.  Full responsibility of the confidentiality of this discharge information lies with you and/or  your care-partner. 

## 2020-08-28 NOTE — Progress Notes (Signed)
Reviewed history with patient noted changes.

## 2020-08-28 NOTE — Op Note (Signed)
[facility] Patient Name: Heidi Franklin Procedure Date: 08/28/2020 10:04 AM MRN: [id number] Endoscopist: Docia Chuck. Henrene Pastor MD, MD Age: 74 Referring MD:  Date of Birth: 07-Apr-1946 Gender: Female Account #: [id number] Procedure:                Upper GI endoscopy Indications:              Esophageal reflux, Nausea with vomiting Medicines:                Monitored Anesthesia Care Procedure:                Pre-Anesthesia Assessment:                           - Prior to the procedure, a History and Physical                            was performed, and patient medications and                            allergies were reviewed. The patient's tolerance of                            previous anesthesia was also reviewed. The risks                            and benefits of the procedure and the sedation                            options and risks were discussed with the patient.                            All questions were answered, and informed consent                            was obtained. Prior Anticoagulants: The patient has                            taken no previous anticoagulant or antiplatelet                            agents. After reviewing the risks and benefits, the                            patient was deemed in satisfactory condition to                            undergo the procedure.                           After obtaining informed consent, the endoscope was                            passed under direct vision. Throughout the  procedure, the patient's blood pressure, pulse, and                            oxygen saturations were monitored continuously. The                            Endoscope was introduced through the mouth, and                            advanced to the third part of duodenum. The upper                            GI endoscopy was accomplished without difficulty.                            The patient tolerated the  procedure well. Scope In: Scope Out: Findings:                 The esophagus was normal.                           The stomach was normal save a small hiatal hernia                            and a few diminutive benign fundic gland type                            polyps.                           The examined duodenum was normal.                           The cardia and gastric fundus were normal on                            retroflexion. Complications:            No immediate complications. Estimated Blood Loss:     Estimated blood loss: none. Impression:               1. Essentially normal EGD. Recommendation:           - Patient has a contact number available for                            emergencies. The signs and symptoms of potential                            delayed complications were discussed with the                            patient. Return to normal activities tomorrow.                            Written discharge instructions were provided to the  patient.                           - Resume previous diet.                           - Continue present medications.                           - Schedule solid-phase gastric emptying scan                            "nausea and vomiting, rule out gastroparesis" [name] N. Henrene Pastor MD, MD 08/28/2020 10:15:36 AM This report has been signed electronically.

## 2020-08-28 NOTE — Progress Notes (Signed)
pt tolerated well. VSS. awake and to recovery. Report given to RN. Oral bite block placed and removed without trauma. 

## 2020-08-28 NOTE — Telephone Encounter (Signed)
Patient called and stated she is wanting to get refills/script on Fluticasone nasal spray.  She has a historical provider for the spray and cannot remember who prescribed that for her in the past, but stated she used it 2x a day.

## 2020-08-29 ENCOUNTER — Ambulatory Visit: Payer: Medicare Other | Attending: Family Medicine | Admitting: Occupational Therapy

## 2020-08-29 ENCOUNTER — Other Ambulatory Visit: Payer: Self-pay

## 2020-08-29 ENCOUNTER — Other Ambulatory Visit: Payer: Self-pay | Admitting: Family Medicine

## 2020-08-29 DIAGNOSIS — R208 Other disturbances of skin sensation: Secondary | ICD-10-CM

## 2020-08-29 DIAGNOSIS — M6281 Muscle weakness (generalized): Secondary | ICD-10-CM

## 2020-08-29 MED ORDER — FLUTICASONE PROPIONATE 50 MCG/ACT NA SUSP: 2.0000 | Freq: Every day | NASAL | 6 refills | Status: DC

## 2020-08-29 NOTE — Telephone Encounter (Signed)
Don't see this med in pt hx or being prescribed by you is this something you'd like to order for pt?

## 2020-08-29 NOTE — Progress Notes (Unsigned)
[place]

## 2020-08-29 NOTE — Therapy (Signed)
[facility] [address] [place], [place], [address] Phone: [phone]   Fax:  [phone]  Occupational Therapy Treatment  Patient Details  Name: Heidi Franklin MRN: [id number] Date of Birth: Apr 19, 1946 Referring Provider (OT): Dr. Georgina Snell   Encounter Date: 08/29/2020   OT End of Session - 08/29/20 1308    Visit Number 12    Number of Visits 17    Date for OT Re-Evaluation 09/02/20    Authorization Type MCR    Authorization - Visit Number 12    Authorization - Number of Visits 20    Progress Note Due on Visit 20    OT Start Time 1230    OT Stop Time 1310    OT Time Calculation (min) 40 min    Activity Tolerance Patient tolerated treatment well    Behavior During Therapy [facility] for tasks assessed/performed           Past Medical History:  Diagnosis Date  . ALLERGIC RHINITIS   . Allergy   . Anxiety state, unspecified   . Arthritis   . Asthma   . BRONCHITIS, CHRONIC   . Cataract    b/l  . Chronic low back pain 10/09/2016  . DEPRESSION   . DIABETES MELLITUS, TYPE II   . GERD   . HYPERLIPIDEMIA   . HYPERTENSION   . Pneumonia   . Psoriasis     Past Surgical History:  Procedure Laterality Date  . ABDOMINAL HYSTERECTOMY  1980  . CARPAL TUNNEL RELEASE Right   . TRIGGER FINGER RELEASE Left    Index finger    There were no vitals filed for this visit.   Subjective Assessment - 08/29/20 1233    Subjective  I still have stiffness and sometimes it hurts but not right now    Pertinent History Lt CTS, RT trigger finger of 4th and 5th digits. PMH: OA, DM, HTN, HLD    Currently in Pain? No/denies    Pain Onset More than a month ago              [facility] OT Assessment - 08/29/20 0001      Hand Function   Right Hand Grip (lbs) 30     Right Hand 3 Point Pinch 12 lbs    Left Hand Grip (lbs) 24.4    Left 3 point pinch 10 lbs                    OT Treatments/Exercises (OP) - 08/29/20 0001       ADLs   ADL Comments Assessed grip and pinch strength with improvements bilaterally      Hand Exercises   Other Hand Exercises Upgraded putty to red resistance and issued since pt has increased in bilateral grip strength.  Pt performed grip and pinch strength bilaterally with red putty    Other Hand Exercises Gripper set at level 2 resistance to pick up 1/2 amount of blocks with Rt hand , then remaining 1/2 with Lt hand (reduced down to level 1 resistance after about 8 blocks)                        LUE Fluidotherapy   Number Minutes Fluidotherapy 12 Minutes    LUE Fluidotherapy Location Hand;Wrist                    OT Short Term Goals - 08/15/20 1309  OT SHORT TERM GOAL #1   Title Independent with pain management strategies including splinting, modalities, task modifications, and potential A/E needs for both hands    Time 4    Period Weeks    Status Achieved      OT SHORT TERM GOAL #2   Title Pt to report less pain bilateral hands with functional tasks    Time 4    Period Weeks    Status Achieved      OT SHORT TERM GOAL #3   Title Pt to verbalize understanding with splint wear and care prn and A/E to make opening jars/containers easier    Time 4    Period Weeks    Status Achieved      OT SHORT TERM GOAL #4   Title Independent with HEP for Lt CTS including median n. gliding ex's and for Rt hand ROM ex's    Time 4    Period Weeks    Status Achieved             OT Long Term Goals - 08/29/20 1250      OT LONG TERM GOAL #1   Title Pt to verbalize understanding with strengthening HEP    Time 8    Period Weeks    Status Achieved      OT LONG TERM GOAL #2   Title Grip strength to improve bilaterally by 10 lbs or greater    Baseline Rt = 26 lbs, Lt = 22 lbs    Time 8    Period Weeks    Status Not Met   08/15/20: Rt = 18.7 lbs, Lt = 22 lbs. 08/29/20: Rt = 30, Lt = 24 lbs     OT LONG TERM GOAL #3   Title Pt to improve 3 tip pinch by 3 lbs  bilaterally    Baseline Rt = 6, Lt = 4 lbs.    Time 8    Period Weeks    Status Achieved   Rt = 12, Lt = 10 lbs                Plan - 08/29/20 1309    Clinical Impression Statement Pt has improved in grip and pinch strength bilaterally. Pt has met all STG's and 2/3 LTG's but has improved with grip strength    OT Occupational Profile and History Problem Focused Assessment - Including review of records relating to presenting problem    Occupational performance deficits (Please refer to evaluation for details): IADL's;Leisure    Body Structure / Function / Physical Skills ADL;ROM;IADL;Edema;Sensation;Strength;Flexibility;Coordination;UE functional use;Pain    Rehab Potential Good    OT Frequency 2x / week    OT Duration 8 weeks    OT Treatment/Interventions Self-care/ADL training;Therapeutic exercise;Ultrasound;Manual Therapy;Splinting;Iontophoresis;Therapeutic activities;Paraffin;Cryotherapy;DME and/or AE instruction;Compression bandaging;Fluidtherapy;Electrical Stimulation;Scar mobilization;Moist Heat;Passive range of motion;Patient/family education;Contrast Bath    Plan D/C O.T. (pt was planning on coming 08/31/20 but had another conflict therefore decided to d/c today)    Consulted and Agree with Plan of Care Patient           Patient will benefit from skilled therapeutic intervention in order to improve the following deficits and impairments:   Body Structure / Function / Physical Skills: ADL, ROM, IADL, Edema, Sensation, Strength, Flexibility, Coordination, UE functional use, Pain       Visit Diagnosis: Muscle weakness (generalized)  Other disturbances of skin sensation    Problem List Patient Active Problem List   Diagnosis Date Noted  . Pedal  edema 03/27/2020  . Toe ulcer ([place]) 03/10/2020  . History of chicken pox 04/11/2019  . Vitamin D deficiency 04/05/2019  . Lumbar radiculopathy 03/31/2019  . Wheezing 02/25/2019  . Neck pain 07/06/2018  . Trapezius muscle  strain, right, initial encounter 06/23/2018  . Edema 06/23/2018  . Acute gout 04/08/2018  . Herpes simplex 02/10/2018  . ETD (eustachian tube dysfunction) 02/10/2018  . Peroneal tendinitis, right 01/06/2018  . Psoriasis 11/14/2017  . Acute bursitis of right shoulder 09/29/2017  . Bilateral hip pain 07/10/2017  . Bronchiectasis without complication ([place]) 83/41/9622  . Irritable larynx syndrome 04/25/2017  . Reflux laryngitis 04/23/2017  . Benign lipomatous neoplasm of skin and subcutaneous tissue of right leg 02/25/2017  . Anemia 02/05/2017  . Fatigue 01/23/2017  . Cough 01/02/2017  . Multiple pulmonary nodules 11/10/2016  . Chronic low back pain 10/09/2016  . Degenerative arthritis of right knee 06/20/2016  . Degenerative arthritis of left knee 04/10/2016  . Bronchitis 12/15/2015  . Peroneal tendinitis of left lower extremity 09/05/2015  . Recurrent falls 08/08/2015  . Bursitis of left shoulder 07/13/2015  . Morbid obesity due to excess calories ([place]) 04/16/2015  . Nonspecific abnormal electrocardiogram (ECG) (EKG) 05/11/2011  . Tenosynovitis of finger 04/22/2011  . Type 2 diabetes, uncontrolled, with neuropathy ([place]) 03/02/2010  . Hyperlipidemia associated with type 2 diabetes mellitus ([place]) 03/02/2010  . Depression with anxiety 03/02/2010  . Essential hypertension 03/02/2010  . Chronic rhinitis 03/02/2010  . Upper airway cough syndrome 03/02/2010  . GERD 03/02/2010   OCCUPATIONAL THERAPY DISCHARGE SUMMARY  Visits from Start of Care: 12   Current functional level related to goals / functional outcomes: See above   Remaining deficits: Strength Minimal pain   Education / Equipment: HEP's, splint wear and care, A/E recommendations and task modifications  Plan: Patient agrees to discharge.  Patient goals were partially met. Patient is being discharged due to being pleased with the current functional level.  ?????        Carey Bullocks, OTR/L 08/29/2020,  1:11 PM  [facility] [address] [place], [place], [address] Phone: [phone]   Fax:  [phone]  Name: Heidi Franklin MRN: [id number] Date of Birth: [birth year]

## 2020-08-29 NOTE — Telephone Encounter (Signed)
I sent some to her pharmacy it is just generic flonase which is really over the counter but if I write it her insurance might cover

## 2020-08-30 ENCOUNTER — Telehealth: Payer: Self-pay | Admitting: *Deleted

## 2020-08-30 ENCOUNTER — Telehealth: Payer: Self-pay

## 2020-08-30 ENCOUNTER — Other Ambulatory Visit: Payer: Self-pay

## 2020-08-30 DIAGNOSIS — R112 Nausea with vomiting, unspecified: Secondary | ICD-10-CM

## 2020-08-30 NOTE — Telephone Encounter (Signed)
Pt scheduled for GES at [facility] 09/18/20@7 :30am, pt to arrive there at 7:15am. Pt to be NPO after midnight and hold stomach meds for 8 hours prior to scan. Pt aware of appt and instructions.

## 2020-08-30 NOTE — Telephone Encounter (Signed)
  Follow up Call-  Call back number 08/28/2020  Post procedure Call Back phone  # [phone]  Permission to leave phone message Yes  Some recent data might be hidden     Patient questions:  Do you have a fever, pain , or abdominal swelling? No. Pain Score  0 *  Have you tolerated food without any problems? Yes.    Have you been able to return to your normal activities? Yes.    Do you have any questions about your discharge instructions: Diet   No. Medications  No. Follow up visit  No.  Do you have questions or concerns about your Care? No.  Actions: * If pain score is 4 or above: No action needed, pain <4.  1. Have you developed a fever since your procedure? no  2.   Have you had an respiratory symptoms (SOB or cough) since your procedure? no  3.   Have you tested positive for COVID 19 since your procedure no  4.   Have you had any family members/close contacts diagnosed with the COVID 19 since your procedure?  no   If yes to any of these questions please route to Joylene John, RN and Joella Prince, RN

## 2020-08-30 NOTE — Telephone Encounter (Signed)
Voicemail left for followup.

## 2020-08-31 ENCOUNTER — Ambulatory Visit: Payer: Medicare Other | Admitting: Sports Medicine

## 2020-08-31 ENCOUNTER — Other Ambulatory Visit: Payer: Self-pay | Admitting: Family Medicine

## 2020-08-31 ENCOUNTER — Ambulatory Visit: Payer: Medicare Other | Admitting: Occupational Therapy

## 2020-08-31 DIAGNOSIS — R1013 Epigastric pain: Secondary | ICD-10-CM

## 2020-09-01 DIAGNOSIS — L400 Psoriasis vulgaris: Secondary | ICD-10-CM | POA: Diagnosis not present

## 2020-09-03 ENCOUNTER — Other Ambulatory Visit: Payer: Self-pay | Admitting: Family Medicine

## 2020-09-05 ENCOUNTER — Other Ambulatory Visit: Payer: Self-pay | Admitting: Family Medicine

## 2020-09-06 NOTE — Telephone Encounter (Addendum)
Requesting:ALPRAZOLAM 0.5 MG TABLET Contract:none KPW:[id number] Last Visit:10/2 Next Visit: Last Refill:  Please Advise

## 2020-09-07 ENCOUNTER — Ambulatory Visit (INDEPENDENT_AMBULATORY_CARE_PROVIDER_SITE_OTHER): Payer: Medicare Other | Admitting: Sports Medicine

## 2020-09-07 ENCOUNTER — Other Ambulatory Visit: Payer: Self-pay

## 2020-09-07 ENCOUNTER — Encounter: Payer: Self-pay | Admitting: Sports Medicine

## 2020-09-07 DIAGNOSIS — M79675 Pain in left toe(s): Secondary | ICD-10-CM

## 2020-09-07 DIAGNOSIS — B351 Tinea unguium: Secondary | ICD-10-CM

## 2020-09-07 DIAGNOSIS — E119 Type 2 diabetes mellitus without complications: Secondary | ICD-10-CM | POA: Diagnosis not present

## 2020-09-07 DIAGNOSIS — M79674 Pain in right toe(s): Secondary | ICD-10-CM | POA: Diagnosis not present

## 2020-09-07 NOTE — Progress Notes (Signed)
Subjective: Heidi Franklin is a 74 y.o. female patient with history of diabetes who presents to office today for diabetic foot exam and complaining of long,mildly painful nails while ambulating in shoes; unable to trim. Patient states that the glucose reading this morning was 176 mg/dl. .  Patient denies any new changes in medication or new problems.  Patient Active Problem List   Diagnosis Date Noted  . Pedal edema 03/27/2020  . Toe ulcer ([place]) 03/10/2020  . History of chicken pox 04/11/2019  . Vitamin D deficiency 04/05/2019  . Lumbar radiculopathy 03/31/2019  . Wheezing 02/25/2019  . Neck pain 07/06/2018  . Trapezius muscle strain, right, initial encounter 06/23/2018  . Edema 06/23/2018  . Acute gout 04/08/2018  . Herpes simplex 02/10/2018  . ETD (eustachian tube dysfunction) 02/10/2018  . Peroneal tendinitis, right 01/06/2018  . Psoriasis 11/14/2017  . Acute bursitis of right shoulder 09/29/2017  . Bilateral hip pain 07/10/2017  . Bronchiectasis without complication ([place]) 33/29/5188  . Irritable larynx syndrome 04/25/2017  . Reflux laryngitis 04/23/2017  . Benign lipomatous neoplasm of skin and subcutaneous tissue of right leg 02/25/2017  . Anemia 02/05/2017  . Fatigue 01/23/2017  . Cough 01/02/2017  . Multiple pulmonary nodules 11/10/2016  . Chronic low back pain 10/09/2016  . Degenerative arthritis of right knee 06/20/2016  . Degenerative arthritis of left knee 04/10/2016  . Bronchitis 12/15/2015  . Peroneal tendinitis of left lower extremity 09/05/2015  . Recurrent falls 08/08/2015  . Bursitis of left shoulder 07/13/2015  . Morbid obesity due to excess calories ([place]) 04/16/2015  . Nonspecific abnormal electrocardiogram (ECG) (EKG) 05/11/2011  . Tenosynovitis of finger 04/22/2011  . Type 2 diabetes, uncontrolled, with neuropathy ([place]) 03/02/2010  . Hyperlipidemia associated with type 2 diabetes mellitus ([place]) 03/02/2010  . Depression with anxiety 03/02/2010  .  Essential hypertension 03/02/2010  . Chronic rhinitis 03/02/2010  . Upper airway cough syndrome 03/02/2010  . GERD 03/02/2010   Current Outpatient Medications on File Prior to Visit  Medication Sig Dispense Refill  . acetaminophen (TYLENOL) 325 MG tablet Take 650 mg by mouth every 6 (six) hours as needed.    Marland Kitchen albuterol (VENTOLIN HFA) 108 (90 Base) MCG/ACT inhaler INHALE 1-2 PUFFS INTO THE LUNGS EVERY 6 (SIX) HOURS AS NEEDED FOR WHEEZING OR SHORTNESS OF BREATH. 18 g 3  . ALPRAZolam (XANAX) 0.5 MG tablet TAKE 1 TABLET BY MOUTH EVERY 8 HOURS IF NEEDED FOR ANXIETY. 30 tablet 0  . amLODipine (NORVASC) 10 MG tablet TAKE 1 TABLET BY MOUTH EVERY DAY 90 tablet 1  . benzonatate (TESSALON) 100 MG capsule Take 1 capsule (100 mg total) by mouth 2 (two) times daily as needed for cough. 20 capsule 0  . betamethasone dipropionate (DIPROLENE) 0.05 % ointment Apply to affected areas twice a day and then as needed flare/itch    . Cholecalciferol (VITAMIN D3) 50 MCG (2000 UT) capsule Take 2,000 Units by mouth daily.    . fluticasone (CUTIVATE) 0.05 % cream APPLY TO AFFECTED AREA TWICE A DAY 1 WEEK ON 1 WEEK OFF PRN FLARE    . fluticasone (FLONASE) 50 MCG/ACT nasal spray Place 2 sprays into both nostrils daily. 16 g 6  . furosemide (LASIX) 20 MG tablet TAKE 1 TABLET (20 MG TOTAL) BY MOUTH DAILY AS NEEDED FOR EDEMA. 90 tablet 1  . glimepiride (AMARYL) 1 MG tablet Take 1 tablet (1 mg total) by mouth daily with breakfast. 30 tablet 0  . glucose blood (ONE TOUCH ULTRA TEST) test strip 1  each by Other route daily. Use 1 strips to check blood sugar twice a day Ex E11.9 100 each 3  . HUMIRA PEN-PS/UV/ADOL HS START 40 MG/0.8ML PNKT Every 2 weeks    . HYDROcodone-homatropine (HYCODAN) 5-1.5 MG/5ML syrup Take 5 mLs by mouth 2 (two) times daily as needed for cough. 120 mL 0  . Lancets (ONETOUCH ULTRASOFT) lancets PRN 100 each 3  . losartan (COZAAR) 100 MG tablet Take 1 tablet (100 mg total) by mouth daily. 90 tablet 0  .  lovastatin (MEVACOR) 20 MG tablet Take 1 tablet (20 mg total) by mouth at bedtime. NEEDS OV 90 tablet 0  . mometasone (ELOCON) 0.1 % ointment     . montelukast (SINGULAIR) 10 MG tablet TAKE 1 TABLET BY MOUTH EVERYDAY AT BEDTIME 90 tablet 1  . Multiple Vitamins-Minerals (CENTRUM SILVER PO) Take 1 tablet by mouth every morning.    . pantoprazole (PROTONIX) 40 MG tablet TAKE 1 TABLET BY MOUTH EVERY DAY 90 tablet 1  . Potassium Chloride ER 20 MEQ TBCR TAKE 1 TABLET BY MOUTH EVERY DAY 90 tablet 1  . valACYclovir (VALTREX) 1000 MG tablet Take by mouth.    . valACYclovir (VALTREX) 500 MG tablet Take 500 mg by mouth 2 (two) times daily.  0   No current facility-administered medications on file prior to visit.   Allergies  Allergen Reactions  . Metformin And Related Diarrhea  . Penicillins Rash    No results found for this or any previous visit (from the past 2160 hour(s)).  Objective: General: Patient is awake, alert, and oriented x 3 and in no acute distress.  Integument: Skin is warm, dry and supple bilateral. Nails are tender, long, thickened and  dystrophic with subungual debris, consistent with onychomycosis, 1-5 bilateral. No signs of infection. No open lesions or preulcerative lesions present bilateral. Remaining integument unremarkable.  Vasculature:  Dorsalis Pedis pulse 2/4 bilateral. Posterior Tibial pulse  1/4 bilateral.  Capillary fill time <3 sec 1-5 bilateral. Positive hair growth to the level of the digits. Temperature gradient within normal limits. No varicosities present bilateral. No edema present bilateral.   Neurology: The patient has intact sensation measured with a 5.07/10g Semmes Weinstein Monofilament at all pedal sites bilateral . Vibratory sensation intact bilateral with tuning fork. No Babinski sign present bilateral.   Musculoskeletal: Asymptomatic pes planus pedal deformities noted bilateral. Muscular strength 5/5 in all lower extremity muscular groups bilateral  without pain on range of motion . No tenderness with calf compression bilateral.  Assessment and Plan: Problem List Items Addressed This Visit    None    Visit Diagnoses    Pain due to onychomycosis of toenails of both feet    -  Primary   Diabetes mellitus without complication ([place])           -Examined patient. -Discussed and educated patient on diabetic foot care, especially with  regards to the vascular, neurological and musculoskeletal systems.  -Mechanically debrided all nails 1-5 bilateral using sterile nail nipper and filed with dremel without incident  -Answered all patient questions -Patient to return yearly for Diabetic foot exam or as needed for nail trim -Patient advised to call the office if any problems or questions arise in the meantime.  Landis Martins, DPM

## 2020-09-12 DIAGNOSIS — Z23 Encounter for immunization: Secondary | ICD-10-CM | POA: Diagnosis not present

## 2020-09-15 DIAGNOSIS — L400 Psoriasis vulgaris: Secondary | ICD-10-CM | POA: Diagnosis not present

## 2020-09-18 ENCOUNTER — Encounter (HOSPITAL_COMMUNITY)
Admission: RE | Admit: 2020-09-18 | Discharge: 2020-09-18 | Disposition: A | Discharge status: Home / Self Care | Payer: Medicare Other | Source: Ambulatory Visit | Attending: Internal Medicine | Admitting: Internal Medicine

## 2020-09-18 ENCOUNTER — Other Ambulatory Visit: Payer: Self-pay

## 2020-09-18 DIAGNOSIS — R112 Nausea with vomiting, unspecified: Secondary | ICD-10-CM

## 2020-09-18 DIAGNOSIS — R6881 Early satiety: Secondary | ICD-10-CM | POA: Diagnosis not present

## 2020-09-18 DIAGNOSIS — K5900 Constipation, unspecified: Secondary | ICD-10-CM | POA: Diagnosis not present

## 2020-09-18 DIAGNOSIS — K30 Functional dyspepsia: Secondary | ICD-10-CM | POA: Diagnosis not present

## 2020-09-18 MED ORDER — TECHNETIUM TC 99M SULFUR COLLOID
2.2000 | Freq: Once | INTRAVENOUS | Status: AC | PRN
  Administered 2020-09-18: 2.2 via INTRAVENOUS

## 2020-09-25 ENCOUNTER — Telehealth: Payer: Self-pay | Admitting: Family Medicine

## 2020-09-25 NOTE — Telephone Encounter (Signed)
Make sure she has been COVID tested recently and I can see her early Wednesday morning virtually. I have a dentist appt at 9:30 then leave for my conference at noon but could talk to her around 8:30

## 2020-09-25 NOTE — Telephone Encounter (Signed)
PT is requesting a call back from Dr.B. she has been battling an off and on cough for over a month. Pt saw [facility] on  10/27 and was prescribed an antibiotic. But the cough and voice Hoarse is still there. She would like Charlett Blake to call her back to see what else can be prescribed or what the next step for her for relief.

## 2020-09-26 NOTE — Telephone Encounter (Signed)
Called pt she stated she has post nasal drainage she has been tested for covid negative results. Would like something for post nasal drip

## 2020-09-26 NOTE — Telephone Encounter (Signed)
She should take her Singlulair/Montelukast 10 mg daily, use her flonase/fluticasone in nose daily and add some Claritin/loratadine 10 mg daily and Mucinex, plain twice a day along with increased water intake and a probiotic daily such as Align and if no improvement by next week make a virtual visit to try further meds. Seek care if worsens, fevers develop etc

## 2020-09-26 NOTE — Telephone Encounter (Signed)
PT contacted and information given

## 2020-09-29 DIAGNOSIS — L400 Psoriasis vulgaris: Secondary | ICD-10-CM | POA: Diagnosis not present

## 2020-10-04 ENCOUNTER — Telehealth: Payer: Self-pay | Admitting: Family Medicine

## 2020-10-04 NOTE — Telephone Encounter (Signed)
Patient states she's been coughing for weeks. She does not want to be seen virtually. She does not understand why she has to keep paying for visit and the doctor are not doing anything to help her. Patient refuses appointment states she needs someone to check over her records and give her something.

## 2020-10-04 NOTE — Telephone Encounter (Signed)
Not sure what to tell her, diagnosing coughs can be very difficult and not always infectious. She has had a work up with GI and they are trying to manage her GI symptoms, if her cough persists she probably does need to be seen. Am willing to give her 1 week of Doxycyline 100 mg x 7 days and she should take it with an OTC probiotic and Mucinex twice a day but if she does not improve she needs to come in to discuss next steps and for evaluation.

## 2020-10-06 MED ORDER — DOXYCYCLINE HYCLATE 100 MG PO TABS: 100.0000 mg | ORAL_TABLET | Freq: Two times a day (BID) | ORAL | 0 refills | Status: AC

## 2020-10-06 NOTE — Telephone Encounter (Signed)
Patient notified of note.  Advised that she must be seen if this course of antibiotics does not work for her.  Rx sent in.

## 2020-10-09 DIAGNOSIS — H43813 Vitreous degeneration, bilateral: Secondary | ICD-10-CM | POA: Diagnosis not present

## 2020-10-09 DIAGNOSIS — H524 Presbyopia: Secondary | ICD-10-CM | POA: Diagnosis not present

## 2020-10-09 DIAGNOSIS — H5212 Myopia, left eye: Secondary | ICD-10-CM | POA: Diagnosis not present

## 2020-10-09 DIAGNOSIS — H52221 Regular astigmatism, right eye: Secondary | ICD-10-CM | POA: Diagnosis not present

## 2020-10-09 DIAGNOSIS — H2513 Age-related nuclear cataract, bilateral: Secondary | ICD-10-CM | POA: Diagnosis not present

## 2020-10-09 DIAGNOSIS — H04123 Dry eye syndrome of bilateral lacrimal glands: Secondary | ICD-10-CM | POA: Diagnosis not present

## 2020-10-09 DIAGNOSIS — E119 Type 2 diabetes mellitus without complications: Secondary | ICD-10-CM | POA: Diagnosis not present

## 2020-10-09 LAB — HM DIABETES EYE EXAM

## 2020-10-11 ENCOUNTER — Other Ambulatory Visit: Payer: Self-pay

## 2020-10-11 ENCOUNTER — Ambulatory Visit (INDEPENDENT_AMBULATORY_CARE_PROVIDER_SITE_OTHER): Payer: Medicare Other | Admitting: Podiatry

## 2020-10-11 ENCOUNTER — Ambulatory Visit (INDEPENDENT_AMBULATORY_CARE_PROVIDER_SITE_OTHER): Payer: Medicare Other

## 2020-10-11 DIAGNOSIS — M7661 Achilles tendinitis, right leg: Secondary | ICD-10-CM | POA: Diagnosis not present

## 2020-10-11 DIAGNOSIS — M79671 Pain in right foot: Secondary | ICD-10-CM

## 2020-10-11 MED ORDER — MELOXICAM 15 MG PO TABS: 15.0000 mg | ORAL_TABLET | Freq: Every day | ORAL | 1 refills | Status: DC

## 2020-10-12 ENCOUNTER — Encounter: Payer: Self-pay | Admitting: Family Medicine

## 2020-10-13 ENCOUNTER — Other Ambulatory Visit: Payer: Self-pay | Admitting: Family Medicine

## 2020-10-13 DIAGNOSIS — L400 Psoriasis vulgaris: Secondary | ICD-10-CM | POA: Diagnosis not present

## 2020-10-26 DIAGNOSIS — L400 Psoriasis vulgaris: Secondary | ICD-10-CM | POA: Diagnosis not present

## 2020-10-29 MED ORDER — BETAMETHASONE SOD PHOS & ACET 6 (3-3) MG/ML IJ SUSP
3.0000 mg | Freq: Once | INTRAMUSCULAR | Status: AC
  Administered 2020-10-29: 3 mg via INTRAMUSCULAR

## 2020-10-29 NOTE — Progress Notes (Signed)
   HPI: 75 y.o. female presenting today with a new complaint associated to right posterior heel pain has been hurting for the last 2 weeks.  Patient is normally treated and seen by Dr. Marylene Land.  Patient states that approximately 2 weeks ago she began to notice aching pain to the posterior aspect of the right heel.  Aggravated by movement.  She has not done anything for treatment.  She denies a history of injury.  She has been soaking her foot to help alleviate some of the pain.  She presents for further treatment and evaluation    Past Medical History:  Diagnosis Date  . ALLERGIC RHINITIS   . Allergy   . Anxiety state, unspecified   . Arthritis   . Asthma   . BRONCHITIS, CHRONIC   . Cataract    b/l  . Chronic low back pain 10/09/2016  . DEPRESSION   . DIABETES MELLITUS, TYPE II   . GERD   . HYPERLIPIDEMIA   . HYPERTENSION   . Pneumonia   . Psoriasis       Physical Exam: General: The patient is alert and oriented x3 in no acute distress.  Dermatology: Skin is warm, dry and supple bilateral lower extremities. Negative for open lesions or macerations.  Vascular: Palpable pedal pulses bilaterally. No edema or erythema noted. Capillary refill within normal limits.  Neurological: Epicritic and protective threshold grossly intact bilaterally.   Musculoskeletal Exam: Pain on palpation noted to the posterior tubercle of the right calcaneus at the insertion of the Achilles tendon consistent with retrocalcaneal bursitis. Range of motion within normal limits. Muscle strength 5/5 in all muscle groups bilateral lower extremities.  Radiographic Exam:  Posterior calcaneal spur noted to the respective calcaneus on lateral view. No fracture or dislocation noted. Normal osseous mineralization noted.     Assessment: 1. Insertional Achilles tendinitis right 2. Retrocalcaneal bursitis   Plan of Care:  1. Patient was evaluated. Radiographs were reviewed today. 2. Injection of 0.5 mL Celestone  Soluspan injected into the retrocalcaneal bursa. Care was taken to avoid direct injection into the Achilles tendon. 3.  Prescription for meloxicam 15 mg daily 4.  Continue topical Voltaren and OTC Tylenol as needed 5.  Recommend good supportive shoes that do not aggravate the heel 6.  Return to clinic next scheduled appointment with Dr. Marrianne Mood, DPM [facility]  Dr. Felecia Shelling, DPM    [address]                                        [place], [place] [address]                Office [phone]  Fax [phone]

## 2020-11-09 DIAGNOSIS — L400 Psoriasis vulgaris: Secondary | ICD-10-CM | POA: Diagnosis not present

## 2020-11-14 ENCOUNTER — Other Ambulatory Visit: Payer: Self-pay

## 2020-11-14 DIAGNOSIS — R1013 Epigastric pain: Secondary | ICD-10-CM

## 2020-11-14 MED ORDER — PANTOPRAZOLE SODIUM 40 MG PO TBEC: 40.0000 mg | DELAYED_RELEASE_TABLET | Freq: Every day | ORAL | 3 refills | Status: DC

## 2020-11-23 ENCOUNTER — Telehealth: Payer: Self-pay

## 2020-11-23 DIAGNOSIS — B009 Herpesviral infection, unspecified: Secondary | ICD-10-CM | POA: Diagnosis not present

## 2020-11-23 DIAGNOSIS — L400 Psoriasis vulgaris: Secondary | ICD-10-CM | POA: Diagnosis not present

## 2020-11-23 NOTE — Telephone Encounter (Signed)
Attempted to call pt to get scheduled for next visit for diabetes.

## 2020-11-27 ENCOUNTER — Encounter: Payer: Self-pay | Admitting: Family Medicine

## 2020-12-07 ENCOUNTER — Ambulatory Visit: Payer: PRIVATE HEALTH INSURANCE | Admitting: Sports Medicine

## 2020-12-07 ENCOUNTER — Other Ambulatory Visit: Payer: Self-pay | Admitting: Family Medicine

## 2020-12-07 DIAGNOSIS — L400 Psoriasis vulgaris: Secondary | ICD-10-CM | POA: Diagnosis not present

## 2020-12-20 ENCOUNTER — Other Ambulatory Visit: Payer: Self-pay | Admitting: Podiatry

## 2020-12-20 DIAGNOSIS — M7661 Achilles tendinitis, right leg: Secondary | ICD-10-CM

## 2020-12-21 ENCOUNTER — Encounter: Payer: Self-pay | Admitting: Sports Medicine

## 2020-12-21 ENCOUNTER — Ambulatory Visit (INDEPENDENT_AMBULATORY_CARE_PROVIDER_SITE_OTHER): Payer: Medicare Other | Admitting: Sports Medicine

## 2020-12-21 ENCOUNTER — Other Ambulatory Visit: Payer: Self-pay

## 2020-12-21 DIAGNOSIS — M79674 Pain in right toe(s): Secondary | ICD-10-CM

## 2020-12-21 DIAGNOSIS — M79675 Pain in left toe(s): Secondary | ICD-10-CM

## 2020-12-21 DIAGNOSIS — B351 Tinea unguium: Secondary | ICD-10-CM

## 2020-12-21 DIAGNOSIS — L400 Psoriasis vulgaris: Secondary | ICD-10-CM | POA: Diagnosis not present

## 2020-12-21 DIAGNOSIS — B0089 Other herpesviral infection: Secondary | ICD-10-CM | POA: Diagnosis not present

## 2020-12-21 DIAGNOSIS — E119 Type 2 diabetes mellitus without complications: Secondary | ICD-10-CM

## 2020-12-21 NOTE — Progress Notes (Signed)
Subjective: Heidi Franklin is a 75 y.o. female patient with history of diabetes who presents to office today for diabetic foot exam and complaining of long,mildly painful nails while ambulating in shoes; unable to trim. Patient states that the glucose reading this morning was not recorded but she has been doing better with her blood sugars but the last few times has been a little elevated. Reports that she does not have any pain at her achilles anymore.  No other pedal complaints noted.  Patient Active Problem List   Diagnosis Date Noted  . Pedal edema 03/27/2020  . Toe ulcer ([place]) 03/10/2020  . History of chicken pox 04/11/2019  . Vitamin D deficiency 04/05/2019  . Lumbar radiculopathy 03/31/2019  . Wheezing 02/25/2019  . Neck pain 07/06/2018  . Trapezius muscle strain, right, initial encounter 06/23/2018  . Edema 06/23/2018  . Acute gout 04/08/2018  . Herpes simplex 02/10/2018  . ETD (eustachian tube dysfunction) 02/10/2018  . Peroneal tendinitis, right 01/06/2018  . Psoriasis 11/14/2017  . Acute bursitis of right shoulder 09/29/2017  . Bilateral hip pain 07/10/2017  . Bronchiectasis without complication ([place]) 44/31/5400  . Irritable larynx syndrome 04/25/2017  . Reflux laryngitis 04/23/2017  . Benign lipomatous neoplasm of skin and subcutaneous tissue of right leg 02/25/2017  . Anemia 02/05/2017  . Fatigue 01/23/2017  . Cough 01/02/2017  . Multiple pulmonary nodules 11/10/2016  . Chronic low back pain 10/09/2016  . Degenerative arthritis of right knee 06/20/2016  . Degenerative arthritis of left knee 04/10/2016  . Bronchitis 12/15/2015  . Peroneal tendinitis of left lower extremity 09/05/2015  . Recurrent falls 08/08/2015  . Bursitis of left shoulder 07/13/2015  . Morbid obesity due to excess calories ([place]) 04/16/2015  . Nonspecific abnormal electrocardiogram (ECG) (EKG) 05/11/2011  . Tenosynovitis of finger 04/22/2011  . Type 2 diabetes, uncontrolled, with neuropathy ([place])  03/02/2010  . Hyperlipidemia associated with type 2 diabetes mellitus ([place]) 03/02/2010  . Depression with anxiety 03/02/2010  . Essential hypertension 03/02/2010  . Chronic rhinitis 03/02/2010  . Upper airway cough syndrome 03/02/2010  . GERD 03/02/2010   Current Outpatient Medications on File Prior to Visit  Medication Sig Dispense Refill  . lovastatin (MEVACOR) 20 MG tablet Take 1 tablet (20 mg total) by mouth at bedtime. 90 tablet 1  . acetaminophen (TYLENOL) 325 MG tablet Take 650 mg by mouth every 6 (six) hours as needed.    Marland Kitchen albuterol (VENTOLIN HFA) 108 (90 Base) MCG/ACT inhaler INHALE 1-2 PUFFS INTO THE LUNGS EVERY 6 (SIX) HOURS AS NEEDED FOR WHEEZING OR SHORTNESS OF BREATH. 18 g 3  . ALPRAZolam (XANAX) 0.5 MG tablet TAKE 1 TABLET BY MOUTH EVERY 8 HOURS IF NEEDED FOR ANXIETY. 30 tablet 0  . amLODipine (NORVASC) 10 MG tablet TAKE 1 TABLET BY MOUTH EVERY DAY 90 tablet 1  . benzonatate (TESSALON) 100 MG capsule Take 1 capsule (100 mg total) by mouth 2 (two) times daily as needed for cough. 20 capsule 0  . betamethasone dipropionate (DIPROLENE) 0.05 % ointment Apply to affected areas twice a day and then as needed flare/itch    . Cholecalciferol (VITAMIN D3) 50 MCG (2000 UT) capsule Take 2,000 Units by mouth daily.    . fluticasone (CUTIVATE) 0.05 % cream APPLY TO AFFECTED AREA TWICE A DAY 1 WEEK ON 1 WEEK OFF PRN FLARE    . fluticasone (FLONASE) 50 MCG/ACT nasal spray Place 2 sprays into both nostrils daily. 16 g 6  . furosemide (LASIX) 20 MG tablet TAKE 1  TABLET (20 MG TOTAL) BY MOUTH DAILY AS NEEDED FOR EDEMA. 90 tablet 1  . glimepiride (AMARYL) 1 MG tablet Take 1 tablet (1 mg total) by mouth daily with breakfast. 30 tablet 0  . glucose blood (ONE TOUCH ULTRA TEST) test strip 1 each by Other route daily. Use 1 strips to check blood sugar twice a day Ex E11.9 100 each 3  . HUMIRA PEN-PS/UV/ADOL HS START 40 MG/0.8ML PNKT Every 2 weeks    . HYDROcodone-homatropine (HYCODAN) 5-1.5 MG/5ML  syrup Take 5 mLs by mouth 2 (two) times daily as needed for cough. 120 mL 0  . Lancets (ONETOUCH ULTRASOFT) lancets PRN 100 each 3  . losartan (COZAAR) 100 MG tablet Take 1 tablet (100 mg total) by mouth daily. 90 tablet 0  . meloxicam (MOBIC) 15 MG tablet Take 1 tablet (15 mg total) by mouth daily. 30 tablet 1  . mometasone (ELOCON) 0.1 % ointment     . montelukast (SINGULAIR) 10 MG tablet Take 1 tablet (10 mg total) by mouth at bedtime. 90 tablet 0  . Multiple Vitamins-Minerals (CENTRUM SILVER PO) Take 1 tablet by mouth every morning.    . pantoprazole (PROTONIX) 40 MG tablet Take 1 tablet (40 mg total) by mouth daily. 90 tablet 3  . Potassium Chloride ER 20 MEQ TBCR TAKE 1 TABLET BY MOUTH EVERY DAY 90 tablet 1  . valACYclovir (VALTREX) 1000 MG tablet Take by mouth.    . valACYclovir (VALTREX) 500 MG tablet Take 500 mg by mouth 2 (two) times daily.  0   No current facility-administered medications on file prior to visit.   Allergies  Allergen Reactions  . Metformin And Related Diarrhea  . Penicillins Rash    Recent Results (from the past 2160 hour(s))  HM DIABETES EYE EXAM     Status: None   Collection Time: 10/09/20 12:00 AM  Result Value Ref Range   HM Diabetic Eye Exam No Retinopathy No Retinopathy    Objective: General: Patient is awake, alert, and oriented x 3 and in no acute distress.  Integument: Skin is warm, dry and supple bilateral. Nails are tender, long, thickened and dystrophic with subungual debris, consistent with onychomycosis, 1-5 bilateral and old nail polish present to toes. No signs of infection. No open lesions or preulcerative lesions present bilateral. Remaining integument unremarkable.  Vasculature:  Dorsalis Pedis pulse 2/4 bilateral. Posterior Tibial pulse  1/4 bilateral.  Capillary fill time <3 sec 1-5 bilateral. Positive hair growth to the level of the digits. Temperature gradient within normal limits. No varicosities present bilateral. No edema present  bilateral.   Neurology: The patient has intact sensation measured with a 5.07/10g Semmes Weinstein Monofilament at all pedal sites bilateral . Vibratory sensation intact bilateral with tuning fork. No Babinski sign present bilateral.  Unchanged from prior.  Musculoskeletal: Asymptomatic pes planus pedal deformities noted bilateral.  No reproducible pain to right Achilles.  Muscular strength 5/5 in all lower extremity muscular groups bilateral without pain on range of motion . No tenderness with calf compression bilateral.  Assessment and Plan: Problem List Items Addressed This Visit   None   Visit Diagnoses    Pain due to onychomycosis of toenails of both feet    -  Primary   Diabetes mellitus without complication ([place])         -Examined patient. -Re-Discussed and educated patient on diabetic foot care, especially with  regards to the vascular, neurological and musculoskeletal systems.  -Mechanically debrided all nails 1-5 bilateral using  sterile nail nipper and filed with dremel without incident  -Discussed with patient in re: break from polish periodically  -Answered all patient questions -Return in 3 months for nail care -Patient advised to call the office if any problems or questions arise in the meantime.  Landis Martins, DPM

## 2020-12-25 ENCOUNTER — Other Ambulatory Visit: Payer: Self-pay | Admitting: Family Medicine

## 2020-12-25 ENCOUNTER — Encounter: Payer: Self-pay | Admitting: Family Medicine

## 2020-12-25 MED ORDER — HYDROCODONE-HOMATROPINE 5-1.5 MG/5ML PO SYRP: 5.0000 mL | ORAL_SOLUTION | Freq: Two times a day (BID) | ORAL | 0 refills | Status: DC | PRN

## 2020-12-26 ENCOUNTER — Telehealth: Payer: Self-pay | Admitting: Family Medicine

## 2020-12-26 ENCOUNTER — Other Ambulatory Visit: Payer: Self-pay | Admitting: Family Medicine

## 2020-12-26 ENCOUNTER — Telehealth: Payer: Self-pay | Admitting: *Deleted

## 2020-12-26 MED ORDER — PROMETHAZINE-DM 6.25-15 MG/5ML PO SYRP: 2.5000 mL | ORAL_SOLUTION | Freq: Three times a day (TID) | ORAL | 0 refills | Status: DC | PRN

## 2020-12-26 NOTE — Telephone Encounter (Signed)
[facility] and sent message to provider to see if they can change.

## 2020-12-26 NOTE — Telephone Encounter (Signed)
I have sent in Promethazine DM to use prn. Please let the patient know

## 2020-12-26 NOTE — Telephone Encounter (Signed)
Pharmacy faxed over paper stating:  Hycodan/Hydromet Syrup is on back ordered/unavailable.  Please use Tussionex or Promethazine

## 2020-12-26 NOTE — Telephone Encounter (Signed)
Patient states [facility] does not have medication. Please re-sent to  [facility], [place] [address] Phone:  [phone]  Fax:  [phone]

## 2020-12-26 NOTE — Telephone Encounter (Signed)
Open in error

## 2020-12-27 NOTE — Telephone Encounter (Signed)
Patient notified rx was sent in. 

## 2021-01-02 ENCOUNTER — Other Ambulatory Visit: Payer: Self-pay

## 2021-01-02 ENCOUNTER — Other Ambulatory Visit: Payer: Self-pay | Admitting: Family Medicine

## 2021-01-04 ENCOUNTER — Other Ambulatory Visit: Payer: Self-pay | Admitting: Family Medicine

## 2021-01-04 DIAGNOSIS — L400 Psoriasis vulgaris: Secondary | ICD-10-CM | POA: Diagnosis not present

## 2021-01-17 ENCOUNTER — Other Ambulatory Visit: Payer: Self-pay | Admitting: Family Medicine

## 2021-01-18 ENCOUNTER — Telehealth (INDEPENDENT_AMBULATORY_CARE_PROVIDER_SITE_OTHER): Payer: Medicare Other | Admitting: Family Medicine

## 2021-01-18 ENCOUNTER — Other Ambulatory Visit: Payer: Self-pay

## 2021-01-18 DIAGNOSIS — E1165 Type 2 diabetes mellitus with hyperglycemia: Secondary | ICD-10-CM | POA: Diagnosis not present

## 2021-01-18 DIAGNOSIS — R059 Cough, unspecified: Secondary | ICD-10-CM | POA: Diagnosis not present

## 2021-01-18 DIAGNOSIS — E785 Hyperlipidemia, unspecified: Secondary | ICD-10-CM | POA: Diagnosis not present

## 2021-01-18 DIAGNOSIS — M7552 Bursitis of left shoulder: Secondary | ICD-10-CM

## 2021-01-18 DIAGNOSIS — I10 Essential (primary) hypertension: Secondary | ICD-10-CM | POA: Diagnosis not present

## 2021-01-18 DIAGNOSIS — E1140 Type 2 diabetes mellitus with diabetic neuropathy, unspecified: Secondary | ICD-10-CM

## 2021-01-18 DIAGNOSIS — IMO0002 Reserved for concepts with insufficient information to code with codable children: Secondary | ICD-10-CM

## 2021-01-18 DIAGNOSIS — J40 Bronchitis, not specified as acute or chronic: Secondary | ICD-10-CM | POA: Diagnosis not present

## 2021-01-18 DIAGNOSIS — E1169 Type 2 diabetes mellitus with other specified complication: Secondary | ICD-10-CM

## 2021-01-18 DIAGNOSIS — J310 Chronic rhinitis: Secondary | ICD-10-CM

## 2021-01-18 DIAGNOSIS — L400 Psoriasis vulgaris: Secondary | ICD-10-CM | POA: Diagnosis not present

## 2021-01-18 DIAGNOSIS — E559 Vitamin D deficiency, unspecified: Secondary | ICD-10-CM

## 2021-01-18 MED ORDER — METHYLPREDNISOLONE 4 MG PO TABS: ORAL_TABLET | ORAL | 0 refills | Status: DC

## 2021-01-18 MED ORDER — ALBUTEROL SULFATE HFA 108 (90 BASE) MCG/ACT IN AERS: 1.0000 | INHALATION_SPRAY | Freq: Four times a day (QID) | RESPIRATORY_TRACT | 3 refills | Status: DC | PRN

## 2021-01-18 MED ORDER — ALPRAZOLAM 0.5 MG PO TABS: 0.5000 mg | ORAL_TABLET | Freq: Two times a day (BID) | ORAL | 2 refills | Status: DC | PRN

## 2021-01-18 MED ORDER — PROMETHAZINE-DM 6.25-15 MG/5ML PO SYRP: 2.5000 mL | ORAL_SOLUTION | Freq: Three times a day (TID) | ORAL | 0 refills | Status: DC | PRN

## 2021-01-18 NOTE — Progress Notes (Signed)
Virtual telephone visit    Virtual Visit via Telephone Note   This visit type was conducted due to national recommendations for restrictions regarding the COVID-19 Pandemic (e.g. social distancing) in an effort to limit this patient's exposure and mitigate transmission in our community. Due to her co-morbid illnesses, this patient is at least at moderate risk for complications without adequate follow up. This format is felt to be most appropriate for this patient at this time. The patient did not have access to video technology or had technical difficulties with video requiring transitioning to audio format only (telephone). Physical exam was limited to content and character of the telephone converstion. S Chism, CMA was able to get the patient set up on a telephone visit.   Patient location: home Patient and provider in visit Provider location: Office  I discussed the limitations of evaluation and management by telemedicine and the availability of in person appointments. The patient expressed understanding and agreed to proceed.   Visit Date: 01/18/21  Today's healthcare provider: Penni Homans, MD     Subjective:    Patient ID: Heidi Franklin, female    DOB: 16-Jul-1946, 75 y.o.   MRN: [id number]  Chief Complaint  Patient presents with  . Follow-up  . Diabetes    HPI Patient is in today for follow up on chronic medical concerns. She is feeling well today. no recent febrile illness or hospitalizations. She continues to struggle with cough which improves and worsens but continues to recur. No fevers or chills. She is continuing to struggle with left shoulder pain. No recent fall or injury. Denies CP/palp/SOB/HA/congestion/fevers/GI or GU c/o. Taking meds as prescribed  Past Medical History:  Diagnosis Date  . ALLERGIC RHINITIS   . Allergy   . Anxiety state, unspecified   . Arthritis   . Asthma   . BRONCHITIS, CHRONIC   . Cataract    b/l  . Chronic low back pain 10/09/2016   . DEPRESSION   . DIABETES MELLITUS, TYPE II   . GERD   . HYPERLIPIDEMIA   . HYPERTENSION   . Pneumonia   . Psoriasis     Past Surgical History:  Procedure Laterality Date  . ABDOMINAL HYSTERECTOMY  1980  . CARPAL TUNNEL RELEASE Right   . TRIGGER FINGER RELEASE Left    Index finger    Family History  Problem Relation Age of Onset  . Stroke Mother   . Angina Mother   . Hyperlipidemia Mother   . Hypertension Mother   . Heart disease Mother   . Depression Mother   . Diabetes Father   . Hyperlipidemia Other        Parent  . Hypertension Other        Parent  . Diabetes Sister        x1  . CAD Sister   . COPD Sister        smoker  . Hyperlipidemia Sister   . Hypertension Sister   . Diabetes Brother        x 2  . Hyperlipidemia Brother   . Hypertension Brother   . Diabetes Brother   . Hyperlipidemia Brother   . Hypertension Brother   . CAD Sister   . Arthritis Sister   . Hyperlipidemia Sister   . Hypertension Sister   . Colon cancer Neg Hx   . Esophageal cancer Neg Hx   . Rectal cancer Neg Hx   . Stomach cancer Neg Hx   . Pancreatic cancer  Neg Hx     Social History   Socioeconomic History  . Marital status: Married    Spouse name: Not on file  . Number of children: 0  . Years of education: 64yr colge  . Highest education level: Not on file  Occupational History  . Occupation: Retired   Tobacco Use  . Smoking status: Never Smoker  . Smokeless tobacco: Never Used  Vaping Use  . Vaping Use: Never used  Substance and Sexual Activity  . Alcohol use: No    Alcohol/week: 0.0 standard drinks  . Drug use: No  . Sexual activity: Not Currently  Other Topics Concern  . Not on file  Social History Narrative   Married, lives with spouse-retired from [employer] insurance   Not employed    Drinks coffee occasional, Consumes 1 soda a day    No dietary restrictions   Social Determinants of Radio broadcast assistant Strain: Not on file  Food Insecurity: Not on  file  Transportation Needs: Not on file  Physical Activity: Not on file  Stress: Not on file  Social Connections: Not on file  Intimate Partner Violence: Not on file    Outpatient Medications Prior to Visit  Medication Sig Dispense Refill  . acetaminophen (TYLENOL) 325 MG tablet Take 650 mg by mouth every 6 (six) hours as needed.    Marland Kitchen amLODipine (NORVASC) 10 MG tablet TAKE 1 TABLET BY MOUTH EVERY DAY 90 tablet 1  . benzonatate (TESSALON) 100 MG capsule Take 1 capsule (100 mg total) by mouth 2 (two) times daily as needed for cough. 20 capsule 0  . betamethasone dipropionate (DIPROLENE) 0.05 % ointment Apply to affected areas twice a day and then as needed flare/itch    . Cholecalciferol (VITAMIN D3) 50 MCG (2000 UT) capsule Take 2,000 Units by mouth daily.    . fluticasone (CUTIVATE) 0.05 % cream APPLY TO AFFECTED AREA TWICE A DAY 1 WEEK ON 1 WEEK OFF PRN FLARE    . fluticasone (FLONASE) 50 MCG/ACT nasal spray SPRAY 2 SPRAYS INTO EACH NOSTRIL EVERY DAY 48 mL 2  . glimepiride (AMARYL) 1 MG tablet Take 1 tablet (1 mg total) by mouth daily with breakfast. 30 tablet 0  . glucose blood (ONE TOUCH ULTRA TEST) test strip 1 each by Other route daily. Use 1 strips to check blood sugar twice a day Ex E11.9 100 each 3  . HUMIRA PEN-PS/UV/ADOL HS START 40 MG/0.8ML PNKT Every 2 weeks    . Lancets (ONETOUCH ULTRASOFT) lancets PRN 100 each 3  . losartan (COZAAR) 100 MG tablet Take 1 tablet (100 mg total) by mouth daily. 90 tablet 0  . lovastatin (MEVACOR) 20 MG tablet Take 1 tablet (20 mg total) by mouth at bedtime. 90 tablet 1  . meloxicam (MOBIC) 15 MG tablet Take 1 tablet (15 mg total) by mouth daily. 30 tablet 1  . mometasone (ELOCON) 0.1 % ointment     . montelukast (SINGULAIR) 10 MG tablet TAKE 1 TABLET BY MOUTH EVERYDAY AT BEDTIME 90 tablet 0  . Multiple Vitamins-Minerals (CENTRUM SILVER PO) Take 1 tablet by mouth every morning.    . pantoprazole (PROTONIX) 40 MG tablet Take 1 tablet (40 mg total)  by mouth daily. 90 tablet 3  . Potassium Chloride ER 20 MEQ TBCR TAKE 1 TABLET BY MOUTH EVERY DAY 90 tablet 1  . valACYclovir (VALTREX) 500 MG tablet Take 500 mg by mouth 2 (two) times daily.  0  . albuterol (VENTOLIN HFA) 108 (90  Base) MCG/ACT inhaler INHALE 1-2 PUFFS INTO THE LUNGS EVERY 6 (SIX) HOURS AS NEEDED FOR WHEEZING OR SHORTNESS OF BREATH. 18 g 3  . ALPRAZolam (XANAX) 0.5 MG tablet TAKE 1 TABLET BY MOUTH EVERY 8 HOURS IF NEEDED FOR ANXIETY. 30 tablet 0  . promethazine-dextromethorphan (PROMETHAZINE-DM) 6.25-15 MG/5ML syrup Take 2.5-5 mLs by mouth 3 (three) times daily as needed for cough. 180 mL 0  . furosemide (LASIX) 20 MG tablet TAKE 1 TABLET (20 MG TOTAL) BY MOUTH DAILY AS NEEDED FOR EDEMA. 90 tablet 1  . valACYclovir (VALTREX) 1000 MG tablet Take by mouth.     No facility-administered medications prior to visit.    Allergies  Allergen Reactions  . Metformin And Related Diarrhea  . Penicillins Rash    Review of Systems  Constitutional: Negative for fever and malaise/fatigue.  HENT: Positive for congestion.   Eyes: Negative for blurred vision.  Respiratory: Positive for cough and wheezing. Negative for shortness of breath.   Cardiovascular: Negative for chest pain, palpitations and leg swelling.  Gastrointestinal: Negative for abdominal pain, blood in stool and nausea.  Genitourinary: Negative for dysuria and frequency.  Musculoskeletal: Positive for joint pain. Negative for falls.  Skin: Negative for rash.  Neurological: Negative for dizziness, loss of consciousness and headaches.  Endo/Heme/Allergies: Negative for environmental allergies.  Psychiatric/Behavioral: Negative for depression. The patient is not nervous/anxious.        Objective:    Physical Exam  Unable to obtain via phone visit There were no vitals taken for this visit. Wt Readings from Last 3 Encounters:  08/28/20 158 lb (71.7 kg)  08/23/20 158 lb (71.7 kg)  08/15/20 158 lb 4 oz (71.8 kg)     Diabetic Foot Exam - Simple   No data filed    Lab Results  Component Value Date   WBC 8.2 06/09/2020   HGB 12.0 06/09/2020   HCT 35.9 06/09/2020   PLT 242 06/09/2020   GLUCOSE 64 (L) 06/09/2020   CHOL 160 03/23/2020   TRIG 111.0 03/23/2020   HDL 34.10 (L) 03/23/2020   LDLDIRECT 155.1 02/09/2013   LDLCALC 103 (H) 03/23/2020   ALT 15 06/09/2020   AST 16 06/09/2020   NA 141 06/09/2020   K 3.6 06/09/2020   CL 104 06/09/2020   CREATININE 1.05 (H) 06/09/2020   BUN 21 06/09/2020   CO2 29 06/09/2020   TSH 2.96 03/23/2020   HGBA1C 6.5 03/23/2020   MICROALBUR 1.6 08/08/2016    Lab Results  Component Value Date   TSH 2.96 03/23/2020   Lab Results  Component Value Date   WBC 8.2 06/09/2020   HGB 12.0 06/09/2020   HCT 35.9 06/09/2020   MCV 88.4 06/09/2020   PLT 242 06/09/2020   Lab Results  Component Value Date   NA 141 06/09/2020   K 3.6 06/09/2020   CO2 29 06/09/2020   GLUCOSE 64 (L) 06/09/2020   BUN 21 06/09/2020   CREATININE 1.05 (H) 06/09/2020   BILITOT 0.3 06/09/2020   ALKPHOS 57 03/23/2020   AST 16 06/09/2020   ALT 15 06/09/2020   PROT 7.0 06/09/2020   ALBUMIN 4.0 03/23/2020   CALCIUM 9.9 06/09/2020   ANIONGAP 7 10/14/2015   GFR 97.10 03/23/2020   Lab Results  Component Value Date   CHOL 160 03/23/2020   Lab Results  Component Value Date   HDL 34.10 (L) 03/23/2020   Lab Results  Component Value Date   LDLCALC 103 (H) 03/23/2020   Lab Results  Component  Value Date   TRIG 111.0 03/23/2020   Lab Results  Component Value Date   CHOLHDL 5 03/23/2020   Lab Results  Component Value Date   HGBA1C 6.5 03/23/2020       Assessment & Plan:   Problem List Items Addressed This Visit    Type 2 diabetes, uncontrolled, with neuropathy ([place]) - Primary    hgba1c acceptable, minimize simple carbs. Increase exercise as tolerated. Continue current meds      Relevant Orders   Hemoglobin A1c   Hyperlipidemia associated with type 2 diabetes  mellitus ([place])    Encouraged heart healthy diet, increase exercise, avoid trans fats, consider a krill oil cap daily      Relevant Orders   Lipid panel   Essential hypertension    Monitor and report any concerns, no changes to meds. Encouraged heart healthy diet such as the DASH diet and exercise as tolerated.       Relevant Orders   CBC   Comprehensive metabolic panel   TSH   Chronic rhinitis   Relevant Medications   albuterol (VENTOLIN HFA) 108 (90 Base) MCG/ACT inhaler   Other Relevant Orders   DG Chest 2 View   Bursitis of left shoulder    Encouraged moist heat and gentle stretching as tolerated. May try NSAIDs and prescription meds as directed and report if symptoms worsen or seek immediate care      Bronchitis    Recurrent episodes since childhood. She is given a refill on her cough syrup and a medrol dosepak. She is referred to pulmonology for further consideration.       Cough   Relevant Medications   albuterol (VENTOLIN HFA) 108 (90 Base) MCG/ACT inhaler   Other Relevant Orders   Ambulatory referral to Pulmonology   Vitamin D deficiency    Supplement and monitor           Meds ordered this encounter  Medications  . promethazine-dextromethorphan (PROMETHAZINE-DM) 6.25-15 MG/5ML syrup    Sig: Take 2.5-5 mLs by mouth 3 (three) times daily as needed for cough.    Dispense:  180 mL    Refill:  0  . albuterol (VENTOLIN HFA) 108 (90 Base) MCG/ACT inhaler    Sig: Inhale 1-2 puffs into the lungs every 6 (six) hours as needed for wheezing or shortness of breath.    Dispense:  18 g    Refill:  3    DX Code Needed  .  . methylPREDNISolone (MEDROL) 4 MG tablet    Sig: 5 tab po qd X 1d then 4 tab po qd X 1d then 3 tab po qd X 1d then 2 tab po qd then 1 tab po qd    Dispense:  15 tablet    Refill:  0  . ALPRAZolam (XANAX) 0.5 MG tablet    Sig: Take 1 tablet (0.5 mg total) by mouth 2 (two) times daily as needed for anxiety.    Dispense:  60 tablet    Refill:  2     Not to exceed 5 additional fills before 10/18/2020     I discussed the assessment and treatment plan with the patient. The patient was provided an opportunity to ask questions and all were answered. The patient agreed with the plan and demonstrated an understanding of the instructions.   The patient was advised to call back or seek an in-person evaluation if the symptoms worsen or if the condition fails to improve as anticipated.  I provided 25 minutes  of non-face-to-face time during this encounter.   Penni Homans, MD [facility] at [facility] [phone] (phone) [phone] (fax)  [place]

## 2021-01-18 NOTE — Assessment & Plan Note (Signed)
Encouraged heart healthy diet, increase exercise, avoid trans fats, consider a krill oil cap daily 

## 2021-01-20 NOTE — Assessment & Plan Note (Signed)
hgba1c acceptable, minimize simple carbs. Increase exercise as tolerated. Continue current meds 

## 2021-01-20 NOTE — Assessment & Plan Note (Signed)
Encouraged moist heat and gentle stretching as tolerated. May try NSAIDs and prescription meds as directed and report if symptoms worsen or seek immediate care 

## 2021-01-20 NOTE — Assessment & Plan Note (Signed)
Recurrent episodes since childhood. She is given a refill on her cough syrup and a medrol dosepak. She is referred to pulmonology for further consideration.

## 2021-01-20 NOTE — Assessment & Plan Note (Signed)
Monitor and report any concerns, no changes to meds. Encouraged heart healthy diet such as the DASH diet and exercise as tolerated.  ?

## 2021-01-20 NOTE — Assessment & Plan Note (Signed)
Supplement and monitor 

## 2021-01-24 ENCOUNTER — Encounter: Payer: Self-pay | Admitting: Family Medicine

## 2021-01-29 ENCOUNTER — Ambulatory Visit (HOSPITAL_BASED_OUTPATIENT_CLINIC_OR_DEPARTMENT_OTHER)
Admission: RE | Admit: 2021-01-29 | Discharge: 2021-01-29 | Disposition: A | Discharge status: Home / Self Care | Payer: Medicare Other | Source: Ambulatory Visit | Attending: Family Medicine | Admitting: Family Medicine

## 2021-01-29 ENCOUNTER — Telehealth (INDEPENDENT_AMBULATORY_CARE_PROVIDER_SITE_OTHER): Payer: Medicare Other | Admitting: Family Medicine

## 2021-01-29 ENCOUNTER — Other Ambulatory Visit: Payer: Self-pay

## 2021-01-29 DIAGNOSIS — IMO0002 Reserved for concepts with insufficient information to code with codable children: Secondary | ICD-10-CM

## 2021-01-29 DIAGNOSIS — E1165 Type 2 diabetes mellitus with hyperglycemia: Secondary | ICD-10-CM | POA: Diagnosis not present

## 2021-01-29 DIAGNOSIS — E559 Vitamin D deficiency, unspecified: Secondary | ICD-10-CM | POA: Diagnosis not present

## 2021-01-29 DIAGNOSIS — J387 Other diseases of larynx: Secondary | ICD-10-CM | POA: Diagnosis not present

## 2021-01-29 DIAGNOSIS — I10 Essential (primary) hypertension: Secondary | ICD-10-CM | POA: Diagnosis not present

## 2021-01-29 DIAGNOSIS — E1140 Type 2 diabetes mellitus with diabetic neuropathy, unspecified: Secondary | ICD-10-CM

## 2021-01-29 DIAGNOSIS — R059 Cough, unspecified: Secondary | ICD-10-CM

## 2021-01-29 DIAGNOSIS — K219 Gastro-esophageal reflux disease without esophagitis: Secondary | ICD-10-CM | POA: Diagnosis not present

## 2021-01-29 DIAGNOSIS — J040 Acute laryngitis: Secondary | ICD-10-CM

## 2021-01-29 MED ORDER — DOXYCYCLINE HYCLATE 100 MG PO TABS: 100.0000 mg | ORAL_TABLET | Freq: Two times a day (BID) | ORAL | 0 refills | Status: DC

## 2021-01-29 NOTE — Assessment & Plan Note (Signed)
Struggles with chronic hoarseness especially after a respiratory illness, she will consider referral to ENT but would like to see how she responds to the antibiotic first.

## 2021-01-29 NOTE — Assessment & Plan Note (Signed)
Monitor and report any concerns, no changes to meds. Encouraged heart healthy diet such as the DASH diet and exercise as tolerated.  ?

## 2021-01-29 NOTE — Assessment & Plan Note (Signed)
hgba1c acceptable, minimize simple carbs. Increase exercise as tolerated. Continue current meds. She notes her sugars have been running some higher since she got sick a few weeks ago

## 2021-01-29 NOTE — Assessment & Plan Note (Signed)
Has struggled with intermittent cough since childhood. Worse over the last couple of weeks.will proceed with CXR, started on Course of Doxycycline and she can continue her cough syrup, increase hydration and use probiotic.

## 2021-01-29 NOTE — Assessment & Plan Note (Signed)
She denies any recent flares in reflux. Avoid offending foods, start probiotics. Do not eat large meals in late evening and consider raising head of bed.

## 2021-01-29 NOTE — Progress Notes (Signed)
MyChart Video Visit    Virtual Visit via Video Note   This visit type was conducted due to national recommendations for restrictions regarding the COVID-19 Pandemic (e.g. social distancing) in an effort to limit this patient's exposure and mitigate transmission in our community. This patient is at least at moderate risk for complications without adequate follow up. This format is felt to be most appropriate for this patient at this time. Physical exam was limited by quality of the video and audio technology used for the visit. S Chism, CMA was able to get the patient set up on a video visit.  Patient location: home Patient and provider in visit Provider location: Office  I discussed the limitations of evaluation and management by telemedicine and the availability of in person appointments. The patient expressed understanding and agreed to proceed.  Visit Date: 01/29/2021  Today's healthcare provider: Penni Homans, MD     Subjective:    Patient ID: Heidi Franklin, female    DOB: 30-Sep-1946, 75 y.o.   MRN: [id number]  Chief Complaint  Patient presents with  . Cough    HPI Patient is in today for evaluation of worsening cough over the past few weeks. She has struggled with recurrent bronchitis since childhood and once she gets in a bad cycle the cough worsens and she becomes hoarse. She denies fevers and chills. She notes head and chest congestion. She does note a sore throat as well. Her cough syrup and inhaler help some and a recent course of steroids helped temporarily but now the cough is worsening again. Denies CP/palp/SOB/HA/fevers/GI or GU c/o. Taking meds as prescribed  Past Medical History:  Diagnosis Date  . ALLERGIC RHINITIS   . Allergy   . Anxiety state, unspecified   . Arthritis   . Asthma   . BRONCHITIS, CHRONIC   . Cataract    b/l  . Chronic low back pain 10/09/2016  . DEPRESSION   . DIABETES MELLITUS, TYPE II   . GERD   . HYPERLIPIDEMIA   . HYPERTENSION    . Pneumonia   . Psoriasis     Past Surgical History:  Procedure Laterality Date  . ABDOMINAL HYSTERECTOMY  1980  . CARPAL TUNNEL RELEASE Right   . TRIGGER FINGER RELEASE Left    Index finger    Family History  Problem Relation Age of Onset  . Stroke Mother   . Angina Mother   . Hyperlipidemia Mother   . Hypertension Mother   . Heart disease Mother   . Depression Mother   . Diabetes Father   . Hyperlipidemia Other        Parent  . Hypertension Other        Parent  . Diabetes Sister        x1  . CAD Sister   . COPD Sister        smoker  . Hyperlipidemia Sister   . Hypertension Sister   . Diabetes Brother        x 2  . Hyperlipidemia Brother   . Hypertension Brother   . Diabetes Brother   . Hyperlipidemia Brother   . Hypertension Brother   . CAD Sister   . Arthritis Sister   . Hyperlipidemia Sister   . Hypertension Sister   . Colon cancer Neg Hx   . Esophageal cancer Neg Hx   . Rectal cancer Neg Hx   . Stomach cancer Neg Hx   . Pancreatic cancer Neg Hx  Social History   Socioeconomic History  . Marital status: Married    Spouse name: Not on file  . Number of children: 0  . Years of education: 72yr colge  . Highest education level: Not on file  Occupational History  . Occupation: Retired   Tobacco Use  . Smoking status: Never Smoker  . Smokeless tobacco: Never Used  Vaping Use  . Vaping Use: Never used  Substance and Sexual Activity  . Alcohol use: No    Alcohol/week: 0.0 standard drinks  . Drug use: No  . Sexual activity: Not Currently  Other Topics Concern  . 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 acetaminophen (TYLENOL) 325 MG tablet Take 650 mg by mouth every 6 (six) hours as needed.    Marland Kitchen albuterol (VENTOLIN HFA) 108 (90 Base) MCG/ACT inhaler Inhale 1-2 puffs into the lungs every 6 (six) hours as needed for wheezing or shortness of breath. 18 g 3  . ALPRAZolam (XANAX) 0.5 MG tablet Take 1 tablet (0.5 mg total) by mouth 2 (two) times daily as needed for anxiety. 60 tablet 2  . amLODipine (NORVASC) 10 MG tablet TAKE 1 TABLET BY MOUTH EVERY DAY 90 tablet 1  . benzonatate (TESSALON) 100 MG capsule Take 1 capsule (100 mg total) by mouth 2 (two) times daily as needed for cough. 20 capsule 0  . betamethasone dipropionate (DIPROLENE) 0.05 % ointment Apply to affected areas twice a day and then as needed flare/itch    . Cholecalciferol (VITAMIN D3) 50 MCG (2000 UT) capsule Take 2,000 Units by mouth daily.    . fluticasone (CUTIVATE) 0.05 % cream APPLY TO AFFECTED AREA TWICE A DAY 1 WEEK ON 1 WEEK OFF PRN FLARE    . fluticasone (FLONASE) 50 MCG/ACT nasal spray SPRAY 2 SPRAYS INTO EACH NOSTRIL EVERY DAY 48 mL 2  . glimepiride (AMARYL) 1 MG tablet Take 1 tablet (1 mg total) by mouth daily with breakfast. 30 tablet 0  . glucose blood (ONE TOUCH ULTRA TEST) test strip 1 each by Other route daily. Use 1 strips to check blood sugar twice a day Ex E11.9 100 each 3  . HUMIRA PEN-PS/UV/ADOL HS START 40 MG/0.8ML PNKT Every 2 weeks    . Lancets (ONETOUCH ULTRASOFT) lancets PRN 100 each 3  . losartan (COZAAR) 100 MG tablet Take 1 tablet (100 mg total) by mouth daily. 90 tablet 0  . lovastatin (MEVACOR) 20 MG tablet Take 1 tablet (20 mg total) by mouth at bedtime. 90 tablet 1  . meloxicam (MOBIC) 15 MG tablet Take 1 tablet (15 mg total) by mouth daily. 30 tablet 1  . methylPREDNISolone (MEDROL) 4 MG tablet 5 tab po qd X 1d then 4 tab po qd X 1d then 3 tab po qd  X 1d then 2 tab po qd then 1 tab po qd 15 tablet 0  . mometasone (ELOCON) 0.1 % ointment     . montelukast (SINGULAIR) 10  MG tablet TAKE 1 TABLET BY MOUTH EVERYDAY AT BEDTIME 90 tablet 0  . Multiple Vitamins-Minerals (CENTRUM SILVER PO) Take 1 tablet by mouth every morning.    . pantoprazole (PROTONIX) 40 MG tablet Take 1 tablet (40 mg total) by mouth daily. 90 tablet 3  . Potassium Chloride ER 20 MEQ TBCR TAKE 1 TABLET BY MOUTH EVERY DAY 90 tablet 1  . promethazine-dextromethorphan (PROMETHAZINE-DM) 6.25-15 MG/5ML syrup Take 2.5-5 mLs by mouth 3 (three) times daily as needed for cough. 180 mL 0  . valACYclovir (VALTREX) 500 MG tablet Take 500 mg by mouth 2 (two) times daily.  0   No facility-administered medications prior to visit.    Allergies  Allergen Reactions  . Metformin And Related Diarrhea  . Penicillins Rash    Review of Systems  Constitutional: Negative for fever and malaise/fatigue.  HENT: Negative for congestion.   Eyes: Negative for blurred vision.  Respiratory: Negative for shortness of breath.   Cardiovascular: Negative for chest pain, palpitations and leg swelling.  Gastrointestinal: Negative for abdominal pain, blood in stool and nausea.  Genitourinary: Negative for dysuria and frequency.  Musculoskeletal: Negative for falls.  Skin: Negative for rash.  Neurological: Negative for dizziness, loss of consciousness and headaches.  Endo/Heme/Allergies: Negative for environmental allergies.  Psychiatric/Behavioral: Negative for depression. The patient is not nervous/anxious.        Objective:    Physical Exam Constitutional:      Appearance: Normal appearance. She is obese. She is not ill-appearing.  HENT:     Head: Normocephalic and atraumatic.     Right Ear: External ear normal.     Left Ear: External ear normal.     Nose: Nose normal.  Eyes:     General:        Right eye: No discharge.        Left eye: No discharge.  Pulmonary:     Effort: Pulmonary  effort is normal.  Neurological:     Mental Status: She is alert and oriented to person, place, and time.  Psychiatric:        Behavior: Behavior normal.     There were no vitals taken for this visit. Wt Readings from Last 3 Encounters:  08/28/20 158 lb (71.7 kg)  08/23/20 158 lb (71.7 kg)  08/15/20 158 lb 4 oz (71.8 kg)    Diabetic Foot Exam - Simple   No data filed    Lab Results  Component Value Date   WBC 8.2 06/09/2020   HGB 12.0 06/09/2020   HCT 35.9 06/09/2020   PLT 242 06/09/2020   GLUCOSE 64 (L) 06/09/2020   CHOL 160 03/23/2020   TRIG 111.0 03/23/2020   HDL 34.10 (L) 03/23/2020   LDLDIRECT 155.1 02/09/2013   LDLCALC 103 (H) 03/23/2020   ALT 15 06/09/2020   AST 16 06/09/2020   NA 141 06/09/2020   K 3.6 06/09/2020   CL 104 06/09/2020   CREATININE 1.05 (H) 06/09/2020   BUN 21 06/09/2020   CO2 29 06/09/2020   TSH 2.96 03/23/2020   HGBA1C 6.5 03/23/2020   MICROALBUR 1.6 08/08/2016    Lab Results  Component Value Date   TSH 2.96 03/23/2020   Lab Results  Component Value Date   WBC 8.2 06/09/2020   HGB 12.0 06/09/2020   HCT 35.9 06/09/2020   MCV 88.4 06/09/2020   PLT 242 06/09/2020   Lab Results  Component Value Date   NA 141 06/09/2020   K 3.6 06/09/2020  CO2 29 06/09/2020   GLUCOSE 64 (L) 06/09/2020   BUN 21 06/09/2020   CREATININE 1.05 (H) 06/09/2020   BILITOT 0.3 06/09/2020   ALKPHOS 57 03/23/2020   AST 16 06/09/2020   ALT 15 06/09/2020   PROT 7.0 06/09/2020   ALBUMIN 4.0 03/23/2020   CALCIUM 9.9 06/09/2020   ANIONGAP 7 10/14/2015   GFR 97.10 03/23/2020   Lab Results  Component Value Date   CHOL 160 03/23/2020   Lab Results  Component Value Date   HDL 34.10 (L) 03/23/2020   Lab Results  Component Value Date   LDLCALC 103 (H) 03/23/2020   Lab Results  Component Value Date   TRIG 111.0 03/23/2020   Lab Results  Component Value Date   CHOLHDL 5 03/23/2020   Lab Results  Component Value Date   HGBA1C 6.5 03/23/2020        Assessment & Plan:   Problem List Items Addressed This Visit    Type 2 diabetes, uncontrolled, with neuropathy ([place])    hgba1c acceptable, minimize simple carbs. Increase exercise as tolerated. Continue current meds. She notes her sugars have been running some higher since she got sick a few weeks ago      Essential hypertension    Monitor and report any concerns, no changes to meds. Encouraged heart healthy diet such as the DASH diet and exercise as tolerated.       Cough - Primary    Has struggled with intermittent cough since childhood. Worse over the last couple of weeks.will proceed with CXR, started on Course of Doxycycline and she can continue her cough syrup, increase hydration and use probiotic.       Relevant Orders   DG Chest 2 View   Irritable larynx syndrome    Struggles with chronic hoarseness especially after a respiratory illness, she will consider referral to ENT but would like to see how she responds to the antibiotic first.      Reflux laryngitis    She denies any recent flares in reflux. Avoid offending foods, start probiotics. Do not eat large meals in late evening and consider raising head of bed.       Vitamin D deficiency    Supplement and monitor      Relevant Orders   VITAMIN D 25 Hydroxy (Vit-D Deficiency, Fractures)      I am having Heidi Franklin start on doxycycline. I am also having her maintain her Multiple Vitamins-Minerals (CENTRUM SILVER PO), mometasone, acetaminophen, valACYclovir, onetouch ultrasoft, glucose blood, glimepiride, Humira Pen-Ps/UV/Adol HS Start, betamethasone dipropionate, fluticasone, Vitamin D3, benzonatate, losartan, meloxicam, pantoprazole, lovastatin, fluticasone, Potassium Chloride ER, amLODipine, montelukast, promethazine-dextromethorphan, albuterol, methylPREDNISolone, and ALPRAZolam.  Meds ordered this encounter  Medications  . doxycycline (VIBRA-TABS) 100 MG tablet    Sig: Take 1 tablet (100 mg total) by  mouth 2 (two) times daily.    Dispense:  20 tablet    Refill:  0    I discussed the assessment and treatment plan with the patient. The patient was provided an opportunity to ask questions and all were answered. The patient agreed with the plan and demonstrated an understanding of the instructions.   The patient was advised to call back or seek an in-person evaluation if the symptoms worsen or if the condition fails to improve as anticipated.  I provided 25 minutes of face-to-face time during this encounter.   Penni Homans, MD [facility] [phone] (phone) [phone] (fax)  [facility]

## 2021-01-29 NOTE — Assessment & Plan Note (Signed)
Supplement and monitor 

## 2021-02-01 DIAGNOSIS — L400 Psoriasis vulgaris: Secondary | ICD-10-CM | POA: Diagnosis not present

## 2021-02-03 ENCOUNTER — Other Ambulatory Visit: Payer: Self-pay | Admitting: Family Medicine

## 2021-02-15 DIAGNOSIS — L400 Psoriasis vulgaris: Secondary | ICD-10-CM | POA: Diagnosis not present

## 2021-02-17 ENCOUNTER — Other Ambulatory Visit: Payer: Self-pay | Admitting: Family Medicine

## 2021-02-20 ENCOUNTER — Other Ambulatory Visit (INDEPENDENT_AMBULATORY_CARE_PROVIDER_SITE_OTHER): Payer: Medicare Other

## 2021-02-20 ENCOUNTER — Other Ambulatory Visit: Payer: Self-pay

## 2021-02-20 DIAGNOSIS — E1165 Type 2 diabetes mellitus with hyperglycemia: Secondary | ICD-10-CM | POA: Diagnosis not present

## 2021-02-20 DIAGNOSIS — E1140 Type 2 diabetes mellitus with diabetic neuropathy, unspecified: Secondary | ICD-10-CM | POA: Diagnosis not present

## 2021-02-20 DIAGNOSIS — E785 Hyperlipidemia, unspecified: Secondary | ICD-10-CM | POA: Diagnosis not present

## 2021-02-20 DIAGNOSIS — E1169 Type 2 diabetes mellitus with other specified complication: Secondary | ICD-10-CM | POA: Diagnosis not present

## 2021-02-20 DIAGNOSIS — E559 Vitamin D deficiency, unspecified: Secondary | ICD-10-CM

## 2021-02-20 DIAGNOSIS — I10 Essential (primary) hypertension: Secondary | ICD-10-CM

## 2021-02-20 DIAGNOSIS — IMO0002 Reserved for concepts with insufficient information to code with codable children: Secondary | ICD-10-CM

## 2021-02-20 LAB — COMPREHENSIVE METABOLIC PANEL
ALT: 11 U/L (ref 0–35)
AST: 16 U/L (ref 0–37)
Albumin: 3.6 g/dL (ref 3.5–5.2)
Alkaline Phosphatase: 67 U/L (ref 39–117)
BUN: 15 mg/dL (ref 6–23)
CO2: 26 mEq/L (ref 19–32)
Calcium: 9.5 mg/dL (ref 8.4–10.5)
Chloride: 105 mEq/L (ref 96–112)
Creatinine, Ser: 0.86 mg/dL (ref 0.40–1.20)
GFR: 65.84 mL/min (ref >60.00)
Glucose, Bld: 127 mg/dL — ABNORMAL HIGH (ref 70–99)
Potassium: 3.4 mEq/L — ABNORMAL LOW (ref 3.5–5.1)
Sodium: 141 mEq/L (ref 135–145)
Total Bilirubin: 0.3 mg/dL (ref 0.2–1.2)
Total Protein: 6.8 g/dL (ref 6.0–8.3)

## 2021-02-20 LAB — CBC
HCT: 34 % — ABNORMAL LOW (ref 36.0–46.0)
Hemoglobin: 11.4 g/dL — ABNORMAL LOW (ref 12.0–15.0)
MCHC: 33.4 g/dL (ref 30.0–36.0)
MCV: 90.3 fl (ref 78.0–100.0)
Platelets: 271 10*3/uL (ref 150.0–400.0)
RBC: 3.77 Mil/uL — ABNORMAL LOW (ref 3.87–5.11)
RDW: 13.9 % (ref 11.5–15.5)
WBC: 7.4 10*3/uL (ref 4.0–10.5)

## 2021-02-20 LAB — TSH: TSH: 5.23 u[IU]/mL — ABNORMAL HIGH (ref 0.35–4.50)

## 2021-02-20 LAB — LIPID PANEL
Cholesterol: 136 mg/dL (ref 0–200)
HDL: 33.3 mg/dL — ABNORMAL LOW (ref >39.00)
LDL Cholesterol: 73 mg/dL (ref 0–99)
NonHDL: 102.5
Total CHOL/HDL Ratio: 4
Triglycerides: 146 mg/dL (ref 0.0–149.0)
VLDL: 29.2 mg/dL (ref 0.0–40.0)

## 2021-02-20 LAB — HEMOGLOBIN A1C: Hgb A1c MFr Bld: 6.7 % — ABNORMAL HIGH (ref 4.6–6.5)

## 2021-02-20 LAB — VITAMIN D 25 HYDROXY (VIT D DEFICIENCY, FRACTURES): VITD: 65.89 ng/mL (ref 30.00–100.00)

## 2021-02-21 ENCOUNTER — Other Ambulatory Visit: Payer: Self-pay

## 2021-02-21 ENCOUNTER — Other Ambulatory Visit (INDEPENDENT_AMBULATORY_CARE_PROVIDER_SITE_OTHER): Payer: Medicare Other

## 2021-02-21 DIAGNOSIS — R946 Abnormal results of thyroid function studies: Secondary | ICD-10-CM | POA: Diagnosis not present

## 2021-02-21 DIAGNOSIS — E876 Hypokalemia: Secondary | ICD-10-CM

## 2021-02-21 DIAGNOSIS — R7989 Other specified abnormal findings of blood chemistry: Secondary | ICD-10-CM

## 2021-02-21 LAB — T4, FREE: Free T4: 0.63 ng/dL (ref 0.60–1.60)

## 2021-03-01 DIAGNOSIS — L400 Psoriasis vulgaris: Secondary | ICD-10-CM | POA: Diagnosis not present

## 2021-03-02 ENCOUNTER — Telehealth: Payer: Self-pay | Admitting: Family Medicine

## 2021-03-02 NOTE — Telephone Encounter (Signed)
CallerErmina Franklin Call Back @ [phone]  Patient states she has a chronic cough. Patient is is using inhaler, cough syrup and mucinex. Patient would like to know if there is anything else she can do.  Please advise

## 2021-03-02 NOTE — Telephone Encounter (Signed)
See below

## 2021-03-02 NOTE — Telephone Encounter (Signed)
Lvm detailed message stating instructions below

## 2021-03-04 ENCOUNTER — Other Ambulatory Visit: Payer: Self-pay | Admitting: Family Medicine

## 2021-03-05 ENCOUNTER — Encounter: Payer: Self-pay | Admitting: Family Medicine

## 2021-03-06 ENCOUNTER — Other Ambulatory Visit: Payer: Self-pay | Admitting: Family Medicine

## 2021-03-06 MED ORDER — METHYLPREDNISOLONE 4 MG PO TABS: ORAL_TABLET | ORAL | 0 refills | Status: DC

## 2021-03-06 MED ORDER — CEFDINIR 300 MG PO CAPS: 300.0000 mg | ORAL_CAPSULE | Freq: Two times a day (BID) | ORAL | 0 refills | Status: AC

## 2021-03-06 NOTE — Telephone Encounter (Signed)
Pt is aware of instructions and I gave her the number to pulmonary care to schedule an appointment

## 2021-03-15 DIAGNOSIS — L400 Psoriasis vulgaris: Secondary | ICD-10-CM | POA: Diagnosis not present

## 2021-03-21 ENCOUNTER — Other Ambulatory Visit (INDEPENDENT_AMBULATORY_CARE_PROVIDER_SITE_OTHER): Payer: Medicare Other

## 2021-03-21 ENCOUNTER — Other Ambulatory Visit: Payer: Self-pay

## 2021-03-21 DIAGNOSIS — E876 Hypokalemia: Secondary | ICD-10-CM | POA: Diagnosis not present

## 2021-03-21 LAB — COMPREHENSIVE METABOLIC PANEL
ALT: 14 U/L (ref 0–35)
AST: 14 U/L (ref 0–37)
Albumin: 3.9 g/dL (ref 3.5–5.2)
Alkaline Phosphatase: 60 U/L (ref 39–117)
BUN: 17 mg/dL (ref 6–23)
CO2: 28 mEq/L (ref 19–32)
Calcium: 9.5 mg/dL (ref 8.4–10.5)
Chloride: 105 mEq/L (ref 96–112)
Creatinine, Ser: 0.87 mg/dL (ref 0.40–1.20)
GFR: 64.9 mL/min (ref >60.00)
Glucose, Bld: 136 mg/dL — ABNORMAL HIGH (ref 70–99)
Potassium: 3.6 mEq/L (ref 3.5–5.1)
Sodium: 141 mEq/L (ref 135–145)
Total Bilirubin: 0.4 mg/dL (ref 0.2–1.2)
Total Protein: 6.9 g/dL (ref 6.0–8.3)

## 2021-03-22 ENCOUNTER — Ambulatory Visit (INDEPENDENT_AMBULATORY_CARE_PROVIDER_SITE_OTHER): Payer: Medicare Other | Admitting: Sports Medicine

## 2021-03-22 DIAGNOSIS — E119 Type 2 diabetes mellitus without complications: Secondary | ICD-10-CM | POA: Diagnosis not present

## 2021-03-22 DIAGNOSIS — M7661 Achilles tendinitis, right leg: Secondary | ICD-10-CM | POA: Diagnosis not present

## 2021-03-22 DIAGNOSIS — M79675 Pain in left toe(s): Secondary | ICD-10-CM

## 2021-03-22 DIAGNOSIS — B351 Tinea unguium: Secondary | ICD-10-CM | POA: Diagnosis not present

## 2021-03-22 DIAGNOSIS — M79674 Pain in right toe(s): Secondary | ICD-10-CM

## 2021-03-22 NOTE — Progress Notes (Signed)
Subjective: Heidi Franklin is a 75 y.o. female patient with history of diabetes who returns to office today for nail trim.  Patient is diabetic with blood sugars not recorded this visit.  Last visit to PCP was last month via video.  Patient also reports that she is dealing with a flareup of her Achilles and wants to discuss possible injection.  Patient denies any new injury and reports that she has been doing more walking and standing and she thinks that this could have caused her Achilles to flareup.  Patient denies any other pedal complaints at this time.  Patient Active Problem List   Diagnosis Date Noted  . Pedal edema 03/27/2020  . Toe ulcer ([place]) 03/10/2020  . Body mass index (BMI) 31.0-31.9, adult 11/08/2019  . Disc displacement, lumbar 11/08/2019  . Strain of lumbar paraspinal muscle 11/08/2019  . History of chicken pox 04/11/2019  . Vitamin D deficiency 04/05/2019  . Lumbar radiculopathy 03/31/2019  . Neck pain 07/06/2018  . Trapezius muscle strain, right, initial encounter 06/23/2018  . Edema 06/23/2018  . Acute gout 04/08/2018  . Herpes simplex 02/10/2018  . ETD (eustachian tube dysfunction) 02/10/2018  . Peroneal tendinitis, right 01/06/2018  . Psoriasis 11/14/2017  . Acute bursitis of right shoulder 09/29/2017  . Bilateral hip pain 07/10/2017  . Bronchiectasis without complication ([place]) 37/90/2409  . Irritable larynx syndrome 04/25/2017  . Reflux laryngitis 04/23/2017  . Benign lipomatous neoplasm of skin and subcutaneous tissue of right leg 02/25/2017  . Anemia 02/05/2017  . Fatigue 01/23/2017  . Cough 01/02/2017  . Multiple pulmonary nodules 11/10/2016  . Chronic low back pain 10/09/2016  . Degenerative arthritis of right knee 06/20/2016  . Degenerative arthritis of left knee 04/10/2016  . Bronchitis 12/15/2015  . Peroneal tendinitis of left lower extremity 09/05/2015  . Recurrent falls 08/08/2015  . Bursitis of left shoulder 07/13/2015  . Morbid obesity due to  excess calories ([place]) 04/16/2015  . Nonspecific abnormal electrocardiogram (ECG) (EKG) 05/11/2011  . Tenosynovitis of finger 04/22/2011  . Type 2 diabetes, uncontrolled, with neuropathy ([place]) 03/02/2010  . Hyperlipidemia associated with type 2 diabetes mellitus ([place]) 03/02/2010  . Depression with anxiety 03/02/2010  . Essential hypertension 03/02/2010  . Chronic rhinitis 03/02/2010  . Upper airway cough syndrome 03/02/2010  . GERD 03/02/2010   Current Outpatient Medications on File Prior to Visit  Medication Sig Dispense Refill  . acetaminophen (TYLENOL) 325 MG tablet Take 650 mg by mouth every 6 (six) hours as needed.    Marland Kitchen albuterol (VENTOLIN HFA) 108 (90 Base) MCG/ACT inhaler Inhale 1-2 puffs into the lungs every 6 (six) hours as needed for wheezing or shortness of breath. 18 g 3  . ALPRAZolam (XANAX) 0.5 MG tablet Take 1 tablet (0.5 mg total) by mouth 2 (two) times daily as needed for anxiety. 60 tablet 2  . amLODipine (NORVASC) 10 MG tablet TAKE 1 TABLET BY MOUTH EVERY DAY 90 tablet 1  . benzonatate (TESSALON) 100 MG capsule Take 1 capsule (100 mg total) by mouth 2 (two) times daily as needed for cough. 20 capsule 0  . betamethasone dipropionate (DIPROLENE) 0.05 % ointment Apply to affected areas twice a day and then as needed flare/itch    . Cholecalciferol (VITAMIN D3) 50 MCG (2000 UT) capsule Take 2,000 Units by mouth daily.    . fluticasone (CUTIVATE) 0.05 % cream APPLY TO AFFECTED AREA TWICE A DAY 1 WEEK ON 1 WEEK OFF PRN FLARE    . fluticasone (FLONASE) 50 MCG/ACT nasal  spray SPRAY 2 SPRAYS INTO EACH NOSTRIL EVERY DAY 48 mL 2  . gabapentin (NEURONTIN) 100 MG capsule     . glimepiride (AMARYL) 1 MG tablet TAKE 1 TABLET BY MOUTH DAILY WITH BREAKFAST 90 tablet 0  . glucose blood (ONE TOUCH ULTRA TEST) test strip 1 each by Other route daily. Use 1 strips to check blood sugar twice a day Ex E11.9 100 each 3  . glyBURIDE (DIABETA) 2.5 MG tablet     . HUMIRA PEN-PS/UV/ADOL HS START 40  MG/0.8ML PNKT Every 2 weeks    . hydrochlorothiazide (HYDRODIURIL) 12.5 MG tablet     . ketoconazole (NIZORAL) 2 % cream     . Lancets (ONETOUCH ULTRASOFT) lancets PRN 100 each 3  . losartan-hydrochlorothiazide (HYZAAR) 100-12.5 MG tablet TAKE 1 TABLET BY MOUTH EVERY DAY 90 tablet 1  . lovastatin (MEVACOR) 20 MG tablet Take 1 tablet (20 mg total) by mouth at bedtime. 90 tablet 1  . meloxicam (MOBIC) 15 MG tablet Take 1 tablet (15 mg total) by mouth daily. 30 tablet 1  . methylPREDNISolone (MEDROL) 4 MG tablet 5 tab po qd X 1d then 4 tab po qd X 1d then 3 tab po qd X 1d then 2 tab po qd then 1 tab po qd 15 tablet 0  . mometasone (ELOCON) 0.1 % ointment     . montelukast (SINGULAIR) 10 MG tablet TAKE 1 TABLET BY MOUTH EVERYDAY AT BEDTIME 90 tablet 0  . Multiple Vitamin (MULTI-VITAMIN) tablet     . Multiple Vitamins-Minerals (CENTRUM SILVER PO) Take 1 tablet by mouth every morning.    Marland Kitchen omeprazole (PRILOSEC) 40 MG capsule     . pantoprazole (PROTONIX) 40 MG tablet Take 1 tablet (40 mg total) by mouth daily. 90 tablet 3  . Potassium Chloride ER 20 MEQ TBCR TAKE 1 TABLET BY MOUTH EVERY DAY 90 tablet 1  . potassium chloride SA (KLOR-CON) 20 MEQ tablet     . promethazine-dextromethorphan (PROMETHAZINE-DM) 6.25-15 MG/5ML syrup Take 2.5-5 mLs by mouth 3 (three) times daily as needed for cough. 180 mL 0  . tiZANidine (ZANAFLEX) 2 MG tablet     . valACYclovir (VALTREX) 500 MG tablet Take 500 mg by mouth 2 (two) times daily.  0   No current facility-administered medications on file prior to visit.   Allergies  Allergen Reactions  . Metformin And Related Diarrhea  . Penicillins Rash    Recent Results (from the past 2160 hour(s))  VITAMIN D 25 Hydroxy (Vit-D Deficiency, Fractures)     Status: None   Collection Time: 02/20/21  8:40 AM  Result Value Ref Range   VITD 65.89 30.00 - 100.00 ng/mL  Lipid panel     Status: Abnormal   Collection Time: 02/20/21  8:40 AM  Result Value Ref Range    Cholesterol 136 0 - 200 mg/dL    Comment: ATP III Classification       Desirable:  < 200 mg/dL               Borderline High:  200 - 239 mg/dL          High:  > = 240 mg/dL   Triglycerides 146.0 0.0 - 149.0 mg/dL    Comment: Normal:  <150 mg/dLBorderline High:  150 - 199 mg/dL   HDL 33.30 (L) >39.00 mg/dL   VLDL 29.2 0.0 - 40.0 mg/dL   LDL Cholesterol 73 0 - 99 mg/dL   Total CHOL/HDL Ratio 4     Comment:  Men          Women1/2 Average Risk     3.4          3.3Average Risk          5.0          4.42X Average Risk          9.6          7.13X Average Risk          15.0          11.0                       NonHDL 102.50     Comment: NOTE:  Non-HDL goal should be 30 mg/dL higher than patient's LDL goal (i.e. LDL goal of < 70 mg/dL, would have non-HDL goal of < 100 mg/dL)  TSH     Status: Abnormal   Collection Time: 02/20/21  8:40 AM  Result Value Ref Range   TSH 5.23 (H) 0.35 - 4.50 uIU/mL  Comprehensive metabolic panel     Status: Abnormal   Collection Time: 02/20/21  8:40 AM  Result Value Ref Range   Sodium 141 135 - 145 mEq/L   Potassium 3.4 (L) 3.5 - 5.1 mEq/L   Chloride 105 96 - 112 mEq/L   CO2 26 19 - 32 mEq/L   Glucose, Bld 127 (H) 70 - 99 mg/dL   BUN 15 6 - 23 mg/dL   Creatinine, Ser 0.86 0.40 - 1.20 mg/dL   Total Bilirubin 0.3 0.2 - 1.2 mg/dL   Alkaline Phosphatase 67 39 - 117 U/L   AST 16 0 - 37 U/L   ALT 11 0 - 35 U/L   Total Protein 6.8 6.0 - 8.3 g/dL   Albumin 3.6 3.5 - 5.2 g/dL   GFR 65.84 >60.00 mL/min    Comment: Calculated using the CKD-EPI Creatinine Equation (2021)   Calcium 9.5 8.4 - 10.5 mg/dL  CBC     Status: Abnormal   Collection Time: 02/20/21  8:40 AM  Result Value Ref Range   WBC 7.4 4.0 - 10.5 K/uL   RBC 3.77 (L) 3.87 - 5.11 Mil/uL   Platelets 271.0 150.0 - 400.0 K/uL   Hemoglobin 11.4 (L) 12.0 - 15.0 g/dL   HCT 34.0 (L) 36.0 - 46.0 %   MCV 90.3 78.0 - 100.0 fl   MCHC 33.4 30.0 - 36.0 g/dL   RDW 13.9 11.5 - 15.5 %  Hemoglobin A1c      Status: Abnormal   Collection Time: 02/20/21  8:40 AM  Result Value Ref Range   Hgb A1c MFr Bld 6.7 (H) 4.6 - 6.5 %    Comment: Glycemic Control Guidelines for People with Diabetes:Non Diabetic:  <6%Goal of Therapy: <7%Additional Action Suggested:  >8%   T4, free     Status: None   Collection Time: 02/21/21  1:05 PM  Result Value Ref Range   Free T4 0.63 0.60 - 1.60 ng/dL    Comment: Specimens from patients who are undergoing biotin therapy and /or ingesting biotin supplements may contain high levels of biotin.  The higher biotin concentration in these specimens interferes with this Free T4 assay.  Specimens that contain high levels  of biotin may cause false high results for this Free T4 assay.  Please interpret results in light of the total clinical presentation of the patient.    Comprehensive metabolic panel     Status: Abnormal   Collection  Time: 03/21/21  9:29 AM  Result Value Ref Range   Sodium 141 135 - 145 mEq/L   Potassium 3.6 3.5 - 5.1 mEq/L   Chloride 105 96 - 112 mEq/L   CO2 28 19 - 32 mEq/L   Glucose, Bld 136 (H) 70 - 99 mg/dL   BUN 17 6 - 23 mg/dL   Creatinine, Ser 0.87 0.40 - 1.20 mg/dL   Total Bilirubin 0.4 0.2 - 1.2 mg/dL   Alkaline Phosphatase 60 39 - 117 U/L   AST 14 0 - 37 U/L   ALT 14 0 - 35 U/L   Total Protein 6.9 6.0 - 8.3 g/dL   Albumin 3.9 3.5 - 5.2 g/dL   GFR 64.90 >60.00 mL/min    Comment: Calculated using the CKD-EPI Creatinine Equation (2021)   Calcium 9.5 8.4 - 10.5 mg/dL    Objective: General: Patient is awake, alert, and oriented x 3 and in no acute distress.  Integument: Skin is warm, dry and supple bilateral. Nails are tender, long, thickened and dystrophic with subungual debris, consistent with onychomycosis, 1-5 bilateral.  No signs of infection. No open lesions or preulcerative lesions present bilateral. Remaining integument unremarkable.  Vasculature:  Dorsalis Pedis pulse 2/4 bilateral. Posterior Tibial pulse  1/4 bilateral.  Capillary  fill time <3 sec 1-5 bilateral. Positive hair growth to the level of the digits. Temperature gradient within normal limits. No varicosities present bilateral. No edema present bilateral.   Neurology: The patient has intact sensation measured with a 5.07/10g Semmes Weinstein Monofilament at all pedal sites bilateral . Vibratory sensation intact bilateral with tuning fork. No Babinski sign present bilateral.  Unchanged from prior.  Musculoskeletal: Asymptomatic pes planus pedal deformities noted bilateral.  There is reproducible pain to right Achilles mostly at the lateral insertion, Achilles appears to be intact with no palpable dell.  Thompson sign negative.  Muscular strength 5/5 in all lower extremity muscular groups bilateral without pain on range of motion except at Achilles on right. No tenderness with calf compression bilateral.  Assessment and Plan: Problem List Items Addressed This Visit   None   Visit Diagnoses    Pain due to onychomycosis of toenails of both feet    -  Primary   Relevant Medications   ketoconazole (NIZORAL) 2 % cream   Diabetes mellitus without complication (HCC)       Relevant Medications   glyBURIDE (DIABETA) 2.5 MG tablet   Achilles tendinitis, right leg         -Examined patient. -Discussed care for achilles tendonitis, right -After oral consent and aseptic prep, injected a mixture containing 1 ml of 2%  plain lidocaine, 1 ml 0.5% plain marcaine, 0.5 ml of kenalog 10 and 0.5 ml of dexamethasone phosphate into right lateral heel along site of the Achilles insertion without complication. Post-injection care discussed with patient.  -Advised patient rest and ice and elevation -Dispensed heel lift -Advised patient to avoid strenuous activity for the next few days -Re-Discussed and educated patient on diabetic foot care, especially with  regards to the vascular, neurological and musculoskeletal systems.  -Mechanically debrided all nails 1-5 bilateral using  sterile nail nipper and filed with dremel without incident  -Answered all patient questions -Return in 3 months for nail care or sooner if problems arise -Patient advised to call the office if any problems or questions arise in the meantime.  Landis Martins, DPM

## 2021-03-23 ENCOUNTER — Encounter: Payer: Self-pay | Admitting: Sports Medicine

## 2021-03-23 DIAGNOSIS — M7661 Achilles tendinitis, right leg: Secondary | ICD-10-CM | POA: Diagnosis not present

## 2021-03-23 DIAGNOSIS — M79675 Pain in left toe(s): Secondary | ICD-10-CM | POA: Diagnosis not present

## 2021-03-23 DIAGNOSIS — E119 Type 2 diabetes mellitus without complications: Secondary | ICD-10-CM | POA: Diagnosis not present

## 2021-03-23 DIAGNOSIS — B351 Tinea unguium: Secondary | ICD-10-CM | POA: Diagnosis not present

## 2021-03-23 DIAGNOSIS — M79674 Pain in right toe(s): Secondary | ICD-10-CM | POA: Diagnosis not present

## 2021-03-23 MED ORDER — TRIAMCINOLONE ACETONIDE 10 MG/ML IJ SUSP
10.0000 mg | Freq: Once | INTRAMUSCULAR | Status: AC
  Administered 2021-03-23: 10 mg

## 2021-03-28 ENCOUNTER — Other Ambulatory Visit: Payer: Self-pay

## 2021-03-28 ENCOUNTER — Encounter: Payer: Self-pay | Admitting: Pulmonary Disease

## 2021-03-28 ENCOUNTER — Ambulatory Visit (INDEPENDENT_AMBULATORY_CARE_PROVIDER_SITE_OTHER): Payer: Medicare Other | Admitting: Pulmonary Disease

## 2021-03-28 VITALS — BP 122/60 | HR 85 | Temp 96.2°F | Ht 60.0 in | Wt 155.4 lb

## 2021-03-28 DIAGNOSIS — J040 Acute laryngitis: Secondary | ICD-10-CM | POA: Diagnosis not present

## 2021-03-28 DIAGNOSIS — K219 Gastro-esophageal reflux disease without esophagitis: Secondary | ICD-10-CM | POA: Diagnosis not present

## 2021-03-28 DIAGNOSIS — J479 Bronchiectasis, uncomplicated: Secondary | ICD-10-CM

## 2021-03-28 MED ORDER — BREO ELLIPTA 200-25 MCG/INH IN AEPB: 1.0000 | INHALATION_SPRAY | Freq: Every day | RESPIRATORY_TRACT | 11 refills | Status: DC

## 2021-03-28 MED ORDER — BREO ELLIPTA 100-25 MCG/INH IN AEPB: 1.0000 | INHALATION_SPRAY | Freq: Every day | RESPIRATORY_TRACT | 0 refills | Status: DC

## 2021-03-28 NOTE — Addendum Note (Signed)
Addended by: Valerie Salts on: 03/28/2021 10:12 AM   Modules accepted: Orders

## 2021-03-28 NOTE — Progress Notes (Signed)
@Patient  ID: Heidi Franklin, female    DOB: September 19, 1946, 75 y.o.   MRN: [id number]  Chief Complaint  Patient presents with  . Consult    Intermittent non productive cough     Referring provider: [name], MD  HPI:   75 year old whom we are seeing in consultation for evaluation of chronic cough.  Most recent PCP note reviewed.  Most recent pulmonary note 2018 reviewed.  Patient notes intermittent cough on and on since she was 75 years old.  Have been diagnosed with chronic bronchitis in the past.  Cough comes and goes and is persistent for up to 79-month stretches with interval improvement.  Cough is dry, nonproductive.  Worse at night or lying supine.  Has tried a number of oral medications as well as inhalers including albuterol and other ICS/LABA per her report.  Unclear how much the ICS/LABA helps.  Albuterol helps mildly.  Opiates cough suppressant seem to be the most helpful recently.  She reports intermittent courses of steroids that does help her cough.  No seasonal or environmental changes she can identify that make cough better or worse.  No better or worse when eats or drinks.  She endorses ongoing GERD symptoms despite daily PPI.  Both burn as well as pressure or reflux symptoms.  This is nearly daily.  She states her "stomach does not empty" and she is worried this is the reason why she has reflux.  She keeps a very hoarse voice.  Reviewed most recent chest imaging chest x-ray 01/29/2021, interpretation shows clear lungs.  Reviewed CT scan 04/2017 and on my review and interpretation demonstrates bilateral mild lower lobe bronchiectasis.  Per review of pulmonary notes, she had been on Neurontin as well as Elavil in the past but had some falls.  Due to concern for medication or polypharmacy contributing, these medications were discontinued.  Seems like these medicines helped mildly per notes, not hugely successful.  PMH: Hypertension, diabetes, hyperlipidemia, seasonal  allergies Surgical history: Hysterectomy Family history: CAD in first-degree relatives noted, denies respiratory illnesses in first-degree relatives Social history: Never smoker, lives in [place] / Pulmonary Flowsheets:   ACT:  No flowsheet data found.  MMRC: No flowsheet data found.  [place]:  No flowsheet data found.  Tests:   FENO:  Lab Results  Component Value Date   NITRICOXIDE 15 04/25/2017    PFT: PFT Results Latest Ref Rng & Units 12/24/2013  FVC-Pre L 2.14  FVC-Predicted Pre % 100  FVC-Post L 2.11  FVC-Predicted Post % 99  Pre FEV1/FVC % % 81  Post FEV1/FCV % % 80  FEV1-Pre L 1.72  FEV1-Predicted Pre % 104  FEV1-Post L 1.69  Personally reviewed, spirometry normal  WALK:  No flowsheet data found.  Imaging: Personally reviewed and as per EMR discussion this note  Lab Results: Personally reviewed, eosinophils as high as 300 in the past CBC    Component Value Date/Time   WBC 7.4 02/20/2021 0840   RBC 3.77 (L) 02/20/2021 0840   HGB 11.4 (L) 02/20/2021 0840   HCT 34.0 (L) 02/20/2021 0840   PLT 271.0 02/20/2021 0840   MCV 90.3 02/20/2021 0840   MCH 29.6 06/09/2020 1519   MCHC 33.4 02/20/2021 0840   RDW 13.9 02/20/2021 0840   LYMPHSABS 3,510 06/09/2020 1519   MONOABS 0.6 06/21/2019 1214   EOSABS 139 06/09/2020 1519   BASOSABS 33 06/09/2020 1519    BMET    Component Value Date/Time   NA 141 03/21/2021  0929   K 3.6 03/21/2021 0929   CL 105 03/21/2021 0929   CO2 28 03/21/2021 0929   GLUCOSE 136 (H) 03/21/2021 0929   BUN 17 03/21/2021 0929   CREATININE 0.87 03/21/2021 0929   CREATININE 1.05 (H) 06/09/2020 1519   CALCIUM 9.5 03/21/2021 0929   GFRNONAA >60 10/14/2015 1225   GFRAA >60 10/14/2015 1225    BNP No results found for: BNP  ProBNP No results found for: PROBNP  Specialty Problems      Pulmonary Problems   Chronic rhinitis    Followed in Pulmonary clinic/ [facility]/ [place]  CT sinus 11/08/11>>Negative  paranasal sinuses.  Rightward nasal septal deviation and possible rhinitis. 01/13/12>>RAST negative, IgE 22.7       Upper airway cough syndrome    [facility] [place]  CT sinus 11/08/11>>Negative paranasal sinuses.  Rightward nasal septal deviation and possible rhinitis. 01/13/12>>  RAST negative, IgE 22.7 PFT 11/07/11>>FEV1 1.79 (102%),  Ratio 82, TLC 4.18 (104%), DLCO 69%, positive BD only in terms of fef 25/75 -med calendar 11/23/2012 , 03/18/2013 , 08/26/2013 , 12/06/2013, 06/21/2014 > not keeping up with it ,redone  06/11/2016  -methacholine challenge test  12/24/13 > neg  - try off singulair 12/31/2013 > no worse  - added neurontin 100 mg qid  05/20/14 but did not take consistently as of 06/30/2014 > increased to 100 qid 08/23/2014 improved then stopped it and flared spirng of 2016  - 07/05/2015 increase neurontin 300 tid and added elavil 10 mg at hs > not clear she did this at f/u ov 08/03/2015  - added flutter valve 08/03/2015  - neurontin/ultram /elavil stopped 07/2015 due to falls > no worse 12/04/2015           Bronchitis   Multiple pulmonary nodules    CT Renal  10/14/16  x 3 nodules all < 6 mm       Cough    Has struggled with recurrent bronchitis since the age of 67 and has been evaluated and seen recurrently by pulmonary and allergist.       Reflux laryngitis   Irritable larynx syndrome   Bronchiectasis without complication ([place])    See HRCT  05/01/17 with very mild bilateral cylindrical bronchiectasis in the lower lobes bilaterally.         Allergies  Allergen Reactions  . Metformin And Related Diarrhea  . Penicillins Rash    Immunization History  Administered Date(s) Administered  . Fluad Quad(high Dose 65+) 06/25/2019, 07/14/2020  . Influenza Split 08/20/2011, 07/30/2012, 06/28/2014  . Influenza Whole 07/28/2009, 07/06/2010  . Influenza, High Dose Seasonal PF 06/19/2018  . Influenza, Seasonal, Injecte, Preservative Fre 06/25/2019  .  Influenza,inj,Quad PF,6+ Mos 06/23/2013, 07/05/2015, 06/20/2016, 06/23/2017  . Influenza-Unspecified 06/28/2014  . Moderna Sars-Covid-2 Vaccination 11/27/2019, 12/27/2019, 09/12/2020  . Pneumococcal Conjugate-13 06/15/2015  . Pneumococcal Polysaccharide-23 10/02/2011  . Td 11/23/2013    Past Medical History:  Diagnosis Date  . ALLERGIC RHINITIS   . Allergy   . Anxiety state, unspecified   . Arthritis   . Asthma   . BRONCHITIS, CHRONIC   . Cataract    b/l  . Chronic low back pain 10/09/2016  . DEPRESSION   . DIABETES MELLITUS, TYPE II   . GERD   . HYPERLIPIDEMIA   . HYPERTENSION   . Pneumonia   . Psoriasis     Tobacco History: Social History   Tobacco Use  Smoking Status Never Smoker  Smokeless Tobacco Never Used  Counseling given: Not Answered   Continue to not smoke  Outpatient Encounter Medications as of 03/28/2021  Medication Sig  . acetaminophen (TYLENOL) 325 MG tablet Take 650 mg by mouth every 6 (six) hours as needed.  Marland Kitchen albuterol (VENTOLIN HFA) 108 (90 Base) MCG/ACT inhaler Inhale 1-2 puffs into the lungs every 6 (six) hours as needed for wheezing or shortness of breath.  . ALPRAZolam (XANAX) 0.5 MG tablet Take 1 tablet (0.5 mg total) by mouth 2 (two) times daily as needed for anxiety.  Marland Kitchen amLODipine (NORVASC) 10 MG tablet TAKE 1 TABLET BY MOUTH EVERY DAY  . benzonatate (TESSALON) 100 MG capsule Take 1 capsule (100 mg total) by mouth 2 (two) times daily as needed for cough.  . betamethasone dipropionate (DIPROLENE) 0.05 % ointment Apply to affected areas twice a day and then as needed flare/itch  . Cholecalciferol (VITAMIN D3) 50 MCG (2000 UT) capsule Take 2,000 Units by mouth daily.  . fluticasone (CUTIVATE) 0.05 % cream APPLY TO AFFECTED AREA TWICE A DAY 1 WEEK ON 1 WEEK OFF PRN FLARE  . fluticasone (FLONASE) 50 MCG/ACT nasal spray SPRAY 2 SPRAYS INTO EACH NOSTRIL EVERY DAY  . fluticasone furoate-vilanterol (BREO ELLIPTA) 200-25 MCG/INH AEPB Inhale 1 puff  into the lungs daily.  Marland Kitchen glimepiride (AMARYL) 1 MG tablet TAKE 1 TABLET BY MOUTH DAILY WITH BREAKFAST  . glucose blood (ONE TOUCH ULTRA TEST) test strip 1 each by Other route daily. Use 1 strips to check blood sugar twice a day Ex E11.9  . HUMIRA PEN-PS/UV/ADOL HS START 40 MG/0.8ML PNKT Every 2 weeks  . ketoconazole (NIZORAL) 2 % cream   . Lancets (ONETOUCH ULTRASOFT) lancets PRN  . losartan-hydrochlorothiazide (HYZAAR) 100-12.5 MG tablet TAKE 1 TABLET BY MOUTH EVERY DAY  . lovastatin (MEVACOR) 20 MG tablet Take 1 tablet (20 mg total) by mouth at bedtime.  . mometasone (ELOCON) 0.1 % ointment   . montelukast (SINGULAIR) 10 MG tablet TAKE 1 TABLET BY MOUTH EVERYDAY AT BEDTIME  . Multiple Vitamin (MULTI-VITAMIN) tablet   . Multiple Vitamins-Minerals (CENTRUM SILVER PO) Take 1 tablet by mouth every morning.  . pantoprazole (PROTONIX) 40 MG tablet Take 1 tablet (40 mg total) by mouth daily.  . Potassium Chloride ER 20 MEQ TBCR TAKE 1 TABLET BY MOUTH EVERY DAY  . promethazine-dextromethorphan (PROMETHAZINE-DM) 6.25-15 MG/5ML syrup Take 2.5-5 mLs by mouth 3 (three) times daily as needed for cough.  . valACYclovir (VALTREX) 500 MG tablet Take 500 mg by mouth 2 (two) times daily.  Marland Kitchen glyBURIDE (DIABETA) 2.5 MG tablet  (Patient not taking: Reported on 03/28/2021)  . hydrochlorothiazide (HYDRODIURIL) 12.5 MG tablet  (Patient not taking: Reported on 03/28/2021)  . meloxicam (MOBIC) 15 MG tablet Take 1 tablet (15 mg total) by mouth daily. (Patient not taking: Reported on 03/28/2021)  . tiZANidine (ZANAFLEX) 2 MG tablet  (Patient not taking: Reported on 03/28/2021)  . [DISCONTINUED] gabapentin (NEURONTIN) 100 MG capsule   . [DISCONTINUED] methylPREDNISolone (MEDROL) 4 MG tablet 5 tab po qd X 1d then 4 tab po qd X 1d then 3 tab po qd X 1d then 2 tab po qd then 1 tab po qd  . [DISCONTINUED] omeprazole (PRILOSEC) 40 MG capsule  (Patient not taking: Reported on 03/28/2021)  . [DISCONTINUED] potassium chloride SA  (KLOR-CON) 20 MEQ tablet    No facility-administered encounter medications on file as of 03/28/2021.     Review of Systems  Review of Systems  No chest pain with exertion.  No orthopnea or PND.  No lower extremity swelling.  Comprehensive review of systems otherwise negative. Physical Exam  BP 122/60 (BP Location: Right Arm, Cuff Size: Normal)   Pulse 85   Temp (!) 96.2 F (35.7 C) (Temporal)   Ht 5' (1.524 m)   Wt 155 lb 6.4 oz (70.5 kg)   SpO2 99% Comment: Room air  BMI 30.35 kg/m   Wt Readings from Last 5 Encounters:  03/28/21 155 lb 6.4 oz (70.5 kg)  08/28/20 158 lb (71.7 kg)  08/23/20 158 lb (71.7 kg)  08/15/20 158 lb 4 oz (71.8 kg)  06/22/20 158 lb 9.6 oz (71.9 kg)    BMI Readings from Last 5 Encounters:  03/28/21 30.35 kg/m  08/28/20 31.38 kg/m  08/23/20 31.38 kg/m  08/15/20 31.43 kg/m  06/22/20 30.97 kg/m     Physical Exam General: Well-appearing, no distress Eyes: EOMI, no icterus Neck: Supple no JVP Cardiovascular: Regular rate and rhythm, no murmur Pulmonary: Clear to oscillation bilaterally, good air movement, no wheeze, normal work of breathing Abdomen: Nondistended, bowel sounds present MSK: No synovitis, joint effusion Neuro: Normal gait, no weakness, hoarse voice Psych: Normal mood, full affect   Assessment & Plan:   Chronic cough: Suspect multifactorial however most likely her largest contributor suspected to be reflux.  She has breakthrough reflux and heartburn symptoms despite daily PPI.  She has hoarse voice.  Bilateral lower lobe bronchiectasis likely sequela of chronic silent aspiration.  She has mechanical reason for lack of gastric emptying.  Concern for possible contribution of asthma given her longstanding history of atopic symptoms as well as "chronic bronchitis" as a child.  Recent chest x-ray clear.  Asthma: Possible contributor cough.  Trial of ICS/LABA via Breo.  If symptoms specifically cough not improving, would advocate for  evaluation by GI colleagues.  She had establish care with them in the past.   Return in about 3 months (around 06/28/2021).   Lanier Clam, MD 03/28/2021

## 2021-03-28 NOTE — Patient Instructions (Addendum)
Nice to meet you  I worry your cough could be related to asthma but more likely the bigger component related to reflux.  Start Breo 1 puff every day.  I provided samples for this.  The medicine I prescribed has a stronger dose but you can use the samples if we run into problems getting to the medicine via the pharmacy.  If the medicine is too expensive, I recommend you call your prescription drug plan company and ask with the preferred inhaled corticosteroid and long-acting beta agonist is.  Once you find out, let us know and I can send a prescription for this as it may be cheaper.  Return to clinic for follow-up in 3 months or sooner as needed.  If the cough is still persistent we may need to ask our GI doctors for more help in the future.

## 2021-03-29 DIAGNOSIS — L400 Psoriasis vulgaris: Secondary | ICD-10-CM | POA: Diagnosis not present

## 2021-04-05 DIAGNOSIS — Z1231 Encounter for screening mammogram for malignant neoplasm of breast: Secondary | ICD-10-CM | POA: Diagnosis not present

## 2021-04-05 LAB — HM MAMMOGRAPHY

## 2021-04-10 ENCOUNTER — Encounter: Payer: Self-pay | Admitting: *Deleted

## 2021-04-10 ENCOUNTER — Telehealth: Payer: Self-pay | Admitting: Pulmonary Disease

## 2021-04-10 MED ORDER — PREDNISONE 10 MG PO TABS: ORAL_TABLET | ORAL | 0 refills | Status: DC

## 2021-04-10 NOTE — Telephone Encounter (Signed)
Called and spoke with the pt  She states her cough has been worse over the past 3 days  She is not producing any mucus, but has the sensation of mucus in her throat  Her voice sounds hoarse  She is taking Breo daily and feels this has not helped so far  She is rinsing her mouth after using inhaler  She takes her protonix daily before breakfast She denies any f/c/s, aches, wheezing, increased SOB  She has minimal amount of prometh/codeine syrup that her PCP prescribed in March 2022  Please advise if you have any new recommendations for her and if you want to refer to GI as mentioned last ov Thanks!  Allergies  Allergen Reactions   Metformin And Related Diarrhea   Penicillins Rash

## 2021-04-10 NOTE — Telephone Encounter (Signed)
ATC, left VM for callback. 

## 2021-04-10 NOTE — Telephone Encounter (Signed)
Spoke with the pt and notified her of response per Dr Chase Caller. Pt verbalized understanding. She stated that she will take covid test, but she feels as if she is being dismissed. I advised her reason for testing is so that if test was pos we could help her with prescribing antiviral. She was offered televisit or video visit to discuss symptoms further with provider, or ask any questions about her tx plan. She refused each time I offered. I have sent her pred to preferred pharm. I advised her to keep [place] updated on status of covid testing, and that I would be forwarding her msg over to Dr Silas Flood so he is aware of what's going on and see if he has any further recommendations. Thank you!

## 2021-04-10 NOTE — Telephone Encounter (Signed)
With the cough is worse in the last 3 days [she just saw Dr. Silas Flood 2 weeks ago] when she should go get a COVID antigen test.  If the COVID antigen test is positive she can call us back for Paxlovid.  Is a COVID antigen test is negative she needs to go get a PCR test  In the short run I can give some oral prednisone - Please take prednisone 40 mg x1 day, then 30 mg x1 day, then 20 mg x1 day, then 10 mg x1 day, and then 5 mg x1 day and stop.  She can take it if she feels comfortable and there are no side effect profile in the past from this  Overall chronic cough is due to multiple factors particularly cough neuropathy and this is best sorted out through face-to-face visit or televisit with the provider such as a nurse practitioner or Dr. Silas Flood himself

## 2021-04-11 NOTE — Telephone Encounter (Signed)
Called and spoke with patient who states that she took a home test and it was negative. She states that she has picked up her prednisone prescription and just took her 3 pills for today. I advised her that the other recommendation from Dr. Chase Caller was either an in office visit or Televisit. She expressed that she would like to finish the prednisone and see if it helps first and then if she is still not feeling better would call us to schedule appointment. Advised patient to call if he symptoms change or get worse to call and let us know. Patient expressed understanding. Nothing further needed at this time.

## 2021-04-12 ENCOUNTER — Other Ambulatory Visit: Payer: Self-pay | Admitting: Family Medicine

## 2021-04-12 DIAGNOSIS — L400 Psoriasis vulgaris: Secondary | ICD-10-CM | POA: Diagnosis not present

## 2021-04-13 ENCOUNTER — Other Ambulatory Visit: Payer: Self-pay | Admitting: Family Medicine

## 2021-04-13 NOTE — Telephone Encounter (Signed)
Patient checking the status of medication

## 2021-04-20 ENCOUNTER — Other Ambulatory Visit: Payer: Self-pay | Admitting: Family Medicine

## 2021-04-24 ENCOUNTER — Ambulatory Visit: Payer: Medicare Other

## 2021-04-24 NOTE — Progress Notes (Signed)
Subjective:   Heidi Franklin is a 75 y.o. female who presents for Medicare Annual (Subsequent) preventive examination.  I connected with Karrie today by telephone and verified that I am speaking with the correct person using two identifiers. Location patient: home Location provider: work Persons participating in the virtual visit: patient, Marine scientist.    I discussed the limitations, risks, security and privacy concerns of performing an evaluation and management service by telephone and the availability of in person appointments. I also discussed with the patient that there may be a patient responsible charge related to this service. The patient expressed understanding and verbally consented to this telephonic visit.    Interactive audio and video telecommunications were attempted between this provider and patient, however failed, due to patient having technical difficulties OR patient did not have access to video capability.  We continued and completed visit with audio only.  Some vital signs may be absent or patient reported.   Time Spent with patient on telephone encounter: 25 minutes   Review of Systems     Cardiac Risk Factors include: advanced age (>46men, >72 women);diabetes mellitus;dyslipidemia;hypertension;sedentary lifestyle     Objective:    Today's Vitals   04/25/21 0856  Weight: 155 lb (70.3 kg)  Height: 5' (1.524 m)   Body mass index is 30.27 kg/m.  Advanced Directives 04/25/2021 07/04/2020 09/07/2019 01/20/2018 01/14/2017 09/09/2016 08/07/2016  Does Patient Have a Medical Advance Directive? No No No No No No No  Does patient want to make changes to medical advance directive? - - - Yes (ED - Information included in AVS) Yes (ED - Information included in AVS) - -  Would patient like information on creating a medical advance directive? No - Patient declined No - Patient declined No - Patient declined - - No - patient declined information Yes - Educational materials given     Current Medications (verified) Outpatient Encounter Medications as of 04/25/2021  Medication Sig   acetaminophen (TYLENOL) 325 MG tablet Take 650 mg by mouth every 6 (six) hours as needed.   albuterol (VENTOLIN HFA) 108 (90 Base) MCG/ACT inhaler Inhale 1-2 puffs into the lungs every 6 (six) hours as needed for wheezing or shortness of breath.   ALPRAZolam (XANAX) 0.5 MG tablet Take 1 tablet (0.5 mg total) by mouth 2 (two) times daily as needed for anxiety.   amLODipine (NORVASC) 10 MG tablet TAKE 1 TABLET BY MOUTH EVERY DAY   benzonatate (TESSALON) 100 MG capsule Take 1 capsule (100 mg total) by mouth 2 (two) times daily as needed for cough.   betamethasone dipropionate (DIPROLENE) 0.05 % ointment Apply to affected areas twice a day and then as needed flare/itch   Cholecalciferol (VITAMIN D3) 50 MCG (2000 UT) capsule Take 2,000 Units by mouth daily.   fluticasone (CUTIVATE) 0.05 % cream APPLY TO AFFECTED AREA TWICE A DAY 1 WEEK ON 1 WEEK OFF PRN FLARE   fluticasone (FLONASE) 50 MCG/ACT nasal spray SPRAY 2 SPRAYS INTO EACH NOSTRIL EVERY DAY   fluticasone furoate-vilanterol (BREO ELLIPTA) 100-25 MCG/INH AEPB Inhale 1 puff into the lungs daily.   fluticasone furoate-vilanterol (BREO ELLIPTA) 200-25 MCG/INH AEPB Inhale 1 puff into the lungs daily.   glimepiride (AMARYL) 1 MG tablet TAKE 1 TABLET BY MOUTH EVERY DAY WITH BREAKFAST   glucose blood (ONE TOUCH ULTRA TEST) test strip 1 each by Other route daily. Use 1 strips to check blood sugar twice a day Ex E11.9   HUMIRA PEN-PS/UV/ADOL HS START 40 MG/0.8ML PNKT Every 2  weeks   ketoconazole (NIZORAL) 2 % cream    Lancets (ONETOUCH ULTRASOFT) lancets PRN   losartan-hydrochlorothiazide (HYZAAR) 100-12.5 MG tablet TAKE 1 TABLET BY MOUTH EVERY DAY   lovastatin (MEVACOR) 20 MG tablet Take 1 tablet (20 mg total) by mouth at bedtime.   mometasone (ELOCON) 0.1 % ointment    montelukast (SINGULAIR) 10 MG tablet TAKE 1 TABLET BY MOUTH EVERYDAY AT BEDTIME    Multiple Vitamin (MULTI-VITAMIN) tablet    Multiple Vitamins-Minerals (CENTRUM SILVER PO) Take 1 tablet by mouth every morning.   pantoprazole (PROTONIX) 40 MG tablet Take 1 tablet (40 mg total) by mouth daily.   Potassium Chloride ER 20 MEQ TBCR TAKE 1 TABLET BY MOUTH EVERY DAY   predniSONE (DELTASONE) 10 MG tablet 4 x 1 day, 3 x 1 day, 2 x 1 x day, 1 x 1 day , 1/2 x 1 and stop   promethazine-dextromethorphan (PROMETHAZINE-DM) 6.25-15 MG/5ML syrup TAKE 2.5-5 MLS BY MOUTH 3 (THREE) TIMES DAILY AS NEEDED FOR COUGH.   valACYclovir (VALTREX) 500 MG tablet Take 500 mg by mouth 2 (two) times daily.   hydrochlorothiazide (HYDRODIURIL) 12.5 MG tablet  (Patient not taking: No sig reported)   meloxicam (MOBIC) 15 MG tablet Take 1 tablet (15 mg total) by mouth daily. (Patient not taking: No sig reported)   tiZANidine (ZANAFLEX) 2 MG tablet  (Patient not taking: No sig reported)   No facility-administered encounter medications on file as of 04/25/2021.    Allergies (verified) Metformin and related and Penicillins   History: Past Medical History:  Diagnosis Date   ALLERGIC RHINITIS    Allergy    Anxiety state, unspecified    Arthritis    Asthma    BRONCHITIS, CHRONIC    Cataract    b/l   Chronic low back pain 10/09/2016   DEPRESSION    DIABETES MELLITUS, TYPE II    GERD    HYPERLIPIDEMIA    HYPERTENSION    Pneumonia    Psoriasis    Past Surgical History:  Procedure Laterality Date   ABDOMINAL HYSTERECTOMY  1980   CARPAL TUNNEL RELEASE Right    TRIGGER FINGER RELEASE Left    Index finger   Family History  Problem Relation Age of Onset   Stroke Mother    Angina Mother    Hyperlipidemia Mother    Hypertension Mother    Heart disease Mother    Depression Mother    Diabetes Father    Hyperlipidemia Other        Parent   Hypertension Other        Parent   Diabetes Sister        x1   CAD Sister    COPD Sister        smoker   Hyperlipidemia Sister    Hypertension Sister     Diabetes Brother        x 2   Hyperlipidemia Brother    Hypertension Brother    Diabetes Brother    Hyperlipidemia Brother    Hypertension Brother    CAD Sister    Arthritis Sister    Hyperlipidemia Sister    Hypertension Sister    Colon cancer Neg Hx    Esophageal cancer Neg Hx    Rectal cancer Neg Hx    Stomach cancer Neg Hx    Pancreatic cancer Neg Hx    Social History   Socioeconomic History   Marital status: Married    Spouse name: Not on file  Number of children: 0   Years of education: 6yr colge   Highest education level: Not on file  Occupational History   Occupation: Retired   Tobacco Use   Smoking status: Never   Smokeless tobacco: Never  Vaping Use   Vaping Use: Never used  Substance and Sexual Activity   Alcohol use: No    Alcohol/week: 0.0 standard drinks   Drug use: No   Sexual activity: Not Currently  Other Topics Concern   Not on file  Social History Narrative   Married, lives with spouse-retired from [employer] insurance   Not employed    Drinks coffee occasional, Consumes 1 soda a day    No dietary restrictions   Social Determinants of Radio broadcast assistant Strain: Low Risk    Difficulty of Paying Living Expenses: Not very hard  Food Insecurity: No Food Insecurity   Worried About Charity fundraiser in the Last Year: Never true   Arboriculturist in the Last Year: Never true  Transportation Needs: No Transportation Needs   Lack of Transportation (Medical): No   Lack of Transportation (Non-Medical): No  Physical Activity: Inactive   Days of Exercise per Week: 0 days   Minutes of Exercise per Session: 0 min  Stress: Stress Concern Present   Feeling of Stress : To some extent  Social Connections: Engineer, building services of Communication with Friends and Family: More than three times a week   Frequency of Social Gatherings with Friends and Family: More than three times a week   Attends Religious Services: More than 4 times per  year   Active Member of [employer] or Organizations: Yes   Attends Archivist Meetings: 1 to 4 times per year   Marital Status: Married    Tobacco Counseling Counseling given: Not Answered   Clinical Intake:  Pre-visit preparation completed: Yes  Pain : No/denies pain     Nutritional Status: BMI > 30  Obese Nutritional Risks: None Diabetes: Yes CBG done?: No Did pt. bring in CBG monitor from home?: No  How often do you need to have someone help you when you read instructions, pamphlets, or other written materials from your doctor or pharmacy?: 1 - Never  Diabetes:  Is the patient diabetic?  Yes  If diabetic, was a CBG obtained today?  No  Did the patient bring in their glucometer from home?  No phone visit How often do you monitor your CBG's? daily.   Financial Strains and Diabetes Management:  Are you having any financial strains with the device, your supplies or your medication? No .  Does the patient want to be seen by Chronic Care Management for management of their diabetes?  No  Would the patient like to be referred to a Nutritionist or for Diabetic Management?  No   Diabetic Exams:  Diabetic Eye Exam: Completed 10/09/2020.   Diabetic Foot Exam: Completed 09/07/2020.    Interpreter Needed?: No  Information entered by :: Caroleen Hamman LPN   Activities of Daily Living In your present state of health, do you have any difficulty performing the following activities: 04/25/2021 01/18/2021  Hearing? N N  Vision? N Y  Difficulty concentrating or making decisions? N N  Walking or climbing stairs? N N  Dressing or bathing? N N  Doing errands, shopping? N N  Preparing Food and eating ? N -  Using the Toilet? N -  In the past six months, have you accidently leaked  urine? N -  Do you have problems with loss of bowel control? N -  Managing your Medications? N -  Managing your Finances? N -  Housekeeping or managing your Housekeeping? N -  Some recent data  might be hidden    Patient Care Team: [name], MD as PCP - General (Family Medicine) Chesley Mires, MD (Pulmonary Disease) Tuchman, Leslye Peer, DPM (Podiatry) Almedia Balls, MD (Orthopedic Surgery) Irene Shipper, MD (Gastroenterology) Star Age, MD (Neurology)  Indicate any recent Medical Services you may have received from other than [facility] providers in the past year (date may be approximate).     Assessment:   This is a routine wellness examination for Jazzmen.  Hearing/Vision screen Hearing Screening - Comments:: C/o mild hearing Vision Screening - Comments:: Last eye exam-09/2020-Dr. Posey Pronto  Dietary issues and exercise activities discussed: Current Exercise Habits: The patient does not participate in regular exercise at present, Exercise limited by: None identified   Goals Addressed             This Visit's Progress    Patient Stated       Drink more water & increase activity        Depression Screen PHQ 2/9 Scores 04/25/2021 01/18/2021 09/07/2019 01/20/2018 08/08/2017 01/14/2017 08/07/2016  PHQ - 2 Score 1 1 0 0 0 1 0  PHQ- 9 Score - 3 - 5 0 - -    Fall Risk Fall Risk  04/25/2021 09/22/2019 09/07/2019 06/22/2018 01/20/2018  Falls in the past year? 0 0 0 - No  Comment - Emmi Telephone Survey: data to providers prior to load - - -  Number falls in past yr: 0 - 0 2 or more -  Injury with Fall? 0 - 0 No -  Risk Factor Category  - - - High Fall Risk -  Risk for fall due to : - - - History of fall(s) -  Follow up Falls prevention discussed - - Education provided -    FALL RISK PREVENTION PERTAINING TO THE HOME:  Any stairs in or around the home? Yes  If so, are there any without handrails? No  Home free of loose throw rugs in walkways, pet beds, electrical cords, etc? Yes  Adequate lighting in your home to reduce risk of falls? Yes   ASSISTIVE DEVICES UTILIZED TO PREVENT FALLS:  Life alert? No  Use of a cane, walker or w/c? No  Grab bars in the bathroom?  No  Shower chair or bench in shower? No  Elevated toilet seat or a handicapped toilet? No   TIMED UP AND GO:  Was the test performed? No . Phone visit   Cognitive Function:Normal cognitive status assessed by direct observation by this Nurse Health Advisor. No abnormalities found.   MMSE - Mini Mental State Exam 01/20/2018 08/07/2016  Not completed: - (No Data)  Orientation to time 5 -  Orientation to Place 5 -  Registration 3 -  Attention/ Calculation 5 -  Recall 2 -  Language- name 2 objects 2 -  Language- repeat 1 -  Language- follow 3 step command 3 -  Language- read & follow direction 1 -  Write a sentence 1 -  Copy design 1 -  Total score 29 -        Immunizations Immunization History  Administered Date(s) Administered   Fluad Quad(high Dose 65+) 06/25/2019, 07/14/2020   Influenza Split 08/20/2011, 07/30/2012, 06/28/2014   Influenza Whole 07/28/2009, 07/06/2010   Influenza, High Dose Seasonal  PF 06/19/2018   Influenza, Seasonal, Injecte, Preservative Fre 06/25/2019   Influenza,inj,Quad PF,6+ Mos 06/23/2013, 07/05/2015, 06/20/2016, 06/23/2017   Influenza-Unspecified 06/28/2014   Moderna Sars-Covid-2 Vaccination 11/27/2019, 12/27/2019, 09/12/2020   Pneumococcal Conjugate-13 06/15/2015   Pneumococcal Polysaccharide-23 10/02/2011   Td 11/23/2013    TDAP status: Up to date  Flu Vaccine status: Up to date  Pneumococcal vaccine status: Up to date  Covid-19 vaccine status: Completed vaccines  Qualifies for Shingles Vaccine? Yes   Zostavax completed No   Shingrix Completed?: No.    Education has been provided regarding the importance of this vaccine. Patient has been advised to call insurance company to determine out of pocket expense if they have not yet received this vaccine. Advised may also receive vaccine at local pharmacy or Health Dept. Verbalized acceptance and understanding.  Screening Tests Health Maintenance  Topic Date Due   Zoster Vaccines- Shingrix  (1 of 2) Never done   COVID-19 Vaccine (4 - Booster for Moderna series) 01/10/2021   INFLUENZA VACCINE  05/28/2021   HEMOGLOBIN A1C  08/22/2021   FOOT EXAM  09/07/2021   OPHTHALMOLOGY EXAM  10/09/2021   TETANUS/TDAP  11/24/2023   DEXA SCAN  Completed   Hepatitis C Screening  Completed   PNA vac Low Risk Adult  Completed   HPV VACCINES  Aged Out    Health Maintenance  Health Maintenance Due  Topic Date Due   Zoster Vaccines- Shingrix (1 of 2) Never done   COVID-19 Vaccine (4 - Booster for Moderna series) 01/10/2021    Colorectal cancer screening: No longer required.   Mammogram status: Completed Bilateral 04/05/2021. Repeat every year  Bone Density status: Due-Declined today.  Lung Cancer Screening: (Low Dose CT Chest recommended if Age 95-80 years, 30 pack-year currently smoking OR have quit w/in 15years.) does not qualify.     Additional Screening:  Hepatitis C Screening: Completed 02/22/2016  Vision Screening: Recommended annual ophthalmology exams for early detection of glaucoma and other disorders of the eye. Is the patient up to date with their annual eye exam?  Yes  Who is the provider or what is the name of the office in which the patient attends annual eye exams? Dr. Posey Pronto   Dental Screening: Recommended annual dental exams for proper oral hygiene.  Dental resource info mailed to patient .  Community Resource Referral / Chronic Care Management: CRR required this visit?  No   CCM required this visit?  No      Plan:     I have personally reviewed and noted the following in the patient's chart:   Medical and social history Use of alcohol, tobacco or illicit drugs  Current medications and supplements including opioid prescriptions.  Functional ability and status Nutritional status Physical activity Advanced directives List of other physicians Hospitalizations, surgeries, and ER visits in previous 12 months Vitals Screenings to include cognitive,  depression, and falls Referrals and appointments  In addition, I have reviewed and discussed with patient certain preventive protocols, quality metrics, and best practice recommendations. A written personalized care plan for preventive services as well as general preventive health recommendations were provided to patient.   Due to this being a telephonic visit, the after visit summary with patients personalized plan was offered to patient via mail or my-chart. Patient would like to access on my-chart.    Marta Antu, LPN   2/70/3500  Nurse Health Advisor  Nurse Notes: None

## 2021-04-24 NOTE — Progress Notes (Deleted)
Subjective:   Heidi Franklin is a 75 y.o. female who presents for Medicare Annual (Subsequent) preventive examination.  I connected with *** today by telephone and verified that I am speaking with the correct person using two identifiers. Location patient: home Location provider: work Persons participating in the virtual visit: patient, Marine scientist.    I discussed the limitations, risks, security and privacy concerns of performing an evaluation and management service by telephone and the availability of in person appointments. I also discussed with the patient that there may be a patient responsible charge related to this service. The patient expressed understanding and verbally consented to this telephonic visit.    Interactive audio and video telecommunications were attempted between this provider and patient, however failed, due to patient having technical difficulties OR patient did not have access to video capability.  We continued and completed visit with audio only.  Some vital signs may be absent or patient reported.   Time Spent with patient on telephone encounter: *** minutes   Review of Systems    ***       Objective:    There were no vitals filed for this visit. There is no height or weight on file to calculate BMI.  Advanced Directives 07/04/2020 09/07/2019 01/20/2018 01/14/2017 09/09/2016 08/07/2016 07/11/2016  Does Patient Have a Medical Advance Directive? No No No No No No No  Does patient want to make changes to medical advance directive? - - Yes (ED - Information included in AVS) Yes (ED - Information included in AVS) - - -  Would patient like information on creating a medical advance directive? No - Patient declined No - Patient declined - - No - patient declined information Yes - Educational materials given -    Current Medications (verified) Outpatient Encounter Medications as of 04/24/2021  Medication Sig   promethazine-dextromethorphan (PROMETHAZINE-DM) 6.25-15  MG/5ML syrup TAKE 2.5-5 MLS BY MOUTH 3 (THREE) TIMES DAILY AS NEEDED FOR COUGH.   acetaminophen (TYLENOL) 325 MG tablet Take 650 mg by mouth every 6 (six) hours as needed.   albuterol (VENTOLIN HFA) 108 (90 Base) MCG/ACT inhaler Inhale 1-2 puffs into the lungs every 6 (six) hours as needed for wheezing or shortness of breath.   ALPRAZolam (XANAX) 0.5 MG tablet Take 1 tablet (0.5 mg total) by mouth 2 (two) times daily as needed for anxiety.   amLODipine (NORVASC) 10 MG tablet TAKE 1 TABLET BY MOUTH EVERY DAY   benzonatate (TESSALON) 100 MG capsule Take 1 capsule (100 mg total) by mouth 2 (two) times daily as needed for cough.   betamethasone dipropionate (DIPROLENE) 0.05 % ointment Apply to affected areas twice a day and then as needed flare/itch   Cholecalciferol (VITAMIN D3) 50 MCG (2000 UT) capsule Take 2,000 Units by mouth daily.   fluticasone (CUTIVATE) 0.05 % cream APPLY TO AFFECTED AREA TWICE A DAY 1 WEEK ON 1 WEEK OFF PRN FLARE   fluticasone (FLONASE) 50 MCG/ACT nasal spray SPRAY 2 SPRAYS INTO EACH NOSTRIL EVERY DAY   fluticasone furoate-vilanterol (BREO ELLIPTA) 100-25 MCG/INH AEPB Inhale 1 puff into the lungs daily.   fluticasone furoate-vilanterol (BREO ELLIPTA) 200-25 MCG/INH AEPB Inhale 1 puff into the lungs daily.   glimepiride (AMARYL) 1 MG tablet TAKE 1 TABLET BY MOUTH EVERY DAY WITH BREAKFAST   glucose blood (ONE TOUCH ULTRA TEST) test strip 1 each by Other route daily. Use 1 strips to check blood sugar twice a day Ex E11.9   HUMIRA PEN-PS/UV/ADOL HS START 40 MG/0.8ML PNKT  Every 2 weeks   hydrochlorothiazide (HYDRODIURIL) 12.5 MG tablet  (Patient not taking: Reported on 03/28/2021)   ketoconazole (NIZORAL) 2 % cream    Lancets (ONETOUCH ULTRASOFT) lancets PRN   losartan-hydrochlorothiazide (HYZAAR) 100-12.5 MG tablet TAKE 1 TABLET BY MOUTH EVERY DAY   lovastatin (MEVACOR) 20 MG tablet Take 1 tablet (20 mg total) by mouth at bedtime.   meloxicam (MOBIC) 15 MG tablet Take 1 tablet (15  mg total) by mouth daily. (Patient not taking: Reported on 03/28/2021)   mometasone (ELOCON) 0.1 % ointment    montelukast (SINGULAIR) 10 MG tablet TAKE 1 TABLET BY MOUTH EVERYDAY AT BEDTIME   Multiple Vitamin (MULTI-VITAMIN) tablet    Multiple Vitamins-Minerals (CENTRUM SILVER PO) Take 1 tablet by mouth every morning.   pantoprazole (PROTONIX) 40 MG tablet Take 1 tablet (40 mg total) by mouth daily.   Potassium Chloride ER 20 MEQ TBCR TAKE 1 TABLET BY MOUTH EVERY DAY   predniSONE (DELTASONE) 10 MG tablet 4 x 1 day, 3 x 1 day, 2 x 1 x day, 1 x 1 day , 1/2 x 1 and stop   tiZANidine (ZANAFLEX) 2 MG tablet  (Patient not taking: Reported on 03/28/2021)   valACYclovir (VALTREX) 500 MG tablet Take 500 mg by mouth 2 (two) times daily.   No facility-administered encounter medications on file as of 04/24/2021.    Allergies (verified) Metformin and related and Penicillins   History: Past Medical History:  Diagnosis Date   ALLERGIC RHINITIS    Allergy    Anxiety state, unspecified    Arthritis    Asthma    BRONCHITIS, CHRONIC    Cataract    b/l   Chronic low back pain 10/09/2016   DEPRESSION    DIABETES MELLITUS, TYPE II    GERD    HYPERLIPIDEMIA    HYPERTENSION    Pneumonia    Psoriasis    Past Surgical History:  Procedure Laterality Date   ABDOMINAL HYSTERECTOMY  1980   CARPAL TUNNEL RELEASE Right    TRIGGER FINGER RELEASE Left    Index finger   Family History  Problem Relation Age of Onset   Stroke Mother    Angina Mother    Hyperlipidemia Mother    Hypertension Mother    Heart disease Mother    Depression Mother    Diabetes Father    Hyperlipidemia Other        Parent   Hypertension Other        Parent   Diabetes Sister        x1   CAD Sister    COPD Sister        smoker   Hyperlipidemia Sister    Hypertension Sister    Diabetes Brother        x 2   Hyperlipidemia Brother    Hypertension Brother    Diabetes Brother    Hyperlipidemia Brother    Hypertension  Brother    CAD Sister    Arthritis Sister    Hyperlipidemia Sister    Hypertension Sister    Colon cancer Neg Hx    Esophageal cancer Neg Hx    Rectal cancer Neg Hx    Stomach cancer Neg Hx    Pancreatic cancer Neg Hx    Social History   Socioeconomic History   Marital status: Married    Spouse name: Not on file   Number of children: 0   Years of education: 43yr colge   Highest education level: Not  on file  Occupational History   Occupation: Retired   Tobacco Use   Smoking status: Never   Smokeless tobacco: Never  Vaping Use   Vaping Use: Never used  Substance and Sexual Activity   Alcohol use: No    Alcohol/week: 0.0 standard drinks   Drug use: No   Sexual activity: Not Currently  Other Topics Concern   Not on file  Social History Narrative   Married, lives with spouse-retired from [employer] insurance   Not employed    Drinks coffee occasional, Consumes 1 soda a day    No dietary restrictions   Social Determinants of Radio broadcast assistant Strain: Not on file  Food Insecurity: Not on file  Transportation Needs: Not on file  Physical Activity: Not on file  Stress: Not on file  Social Connections: Not on file    Tobacco Counseling Counseling given: Not Answered   Clinical Intake:                 Diabetes:  Is the patient diabetic?  Yes  If diabetic, was a CBG obtained today?  No  Did the patient bring in their glucometer from home?  No phone visit How often do you monitor your CBG's? ***.   Financial Strains and Diabetes Management:  Are you having any financial strains with the device, your supplies or your medication? {YES/NO:21197}.  Does the patient want to be seen by Chronic Care Management for management of their diabetes?  {YES/NO:21197} Would the patient like to be referred to a Nutritionist or for Diabetic Management?  {YES/NO:21197}  Diabetic Exams:  Diabetic Eye Exam: Completed 10/09/2020.   Diabetic Foot Exam: Completed  09/07/2020.           Activities of Daily Living In your present state of health, do you have any difficulty performing the following activities: 01/18/2021  Hearing? N  Vision? Y  Difficulty concentrating or making decisions? N  Walking or climbing stairs? N  Dressing or bathing? N  Doing errands, shopping? N  Some recent data might be hidden    Patient Care Team: [name], MD as PCP - General (Family Medicine) Chesley Mires, MD (Pulmonary Disease) Tuchman, Leslye Peer, DPM (Podiatry) Almedia Balls, MD (Orthopedic Surgery) Irene Shipper, MD (Gastroenterology) Star Age, MD (Neurology)  Indicate any recent Medical Services you may have received from other than [facility] providers in the past year (date may be approximate).     Assessment:   This is a routine wellness examination for Heidi Franklin.  Hearing/Vision screen No results found.  Dietary issues and exercise activities discussed:     Goals Addressed   None    Depression Screen PHQ 2/9 Scores 01/18/2021 09/07/2019 01/20/2018 08/08/2017 01/14/2017 08/07/2016 07/29/2016  PHQ - 2 Score 1 0 0 0 1 0 0  PHQ- 9 Score 3 - 5 0 - - -    Fall Risk Fall Risk  09/22/2019 09/07/2019 06/22/2018 01/20/2018 01/14/2017  Falls in the past year? 0 0 - No Yes  Comment Emmi Telephone Survey: data to providers prior to load - - - -  Number falls in past yr: - 0 2 or more - 2 or more  Injury with Fall? - 0 No - No  Risk Factor Category  - - High Fall Risk - -  Risk for fall due to : - - History of fall(s) - Impaired mobility  Follow up - - Education provided - Falls prevention discussed;Education provided    FALL  RISK PREVENTION PERTAINING TO THE HOME:  Any stairs in or around the home? {YES/NO:21197} If so, are there any without handrails? {YES/NO:21197} Home free of loose throw rugs in walkways, pet beds, electrical cords, etc? {YES/NO:21197} Adequate lighting in your home to reduce risk of falls? {YES/NO:21197}  ASSISTIVE  DEVICES UTILIZED TO PREVENT FALLS:  Life alert? {YES/NO:21197} Use of a cane, walker or w/c? {YES/NO:21197} Grab bars in the bathroom? {YES/NO:21197} Shower chair or bench in shower? {YES/NO:21197} Elevated toilet seat or a handicapped toilet? {YES/NO:21197}  TIMED UP AND GO:  Was the test performed? {YES/NO:21197}.  Length of time to ambulate 10 feet: *** sec.   {Appearance of Gait:2101803}  Cognitive Function: MMSE - Mini Mental State Exam 01/20/2018 08/07/2016  Not completed: - (No Data)  Orientation to time 5 -  Orientation to Place 5 -  Registration 3 -  Attention/ Calculation 5 -  Recall 2 -  Language- name 2 objects 2 -  Language- repeat 1 -  Language- follow 3 step command 3 -  Language- read & follow direction 1 -  Write a sentence 1 -  Copy design 1 -  Total score 29 -        Immunizations Immunization History  Administered Date(s) Administered   Fluad Quad(high Dose 65+) 06/25/2019, 07/14/2020   Influenza Split 08/20/2011, 07/30/2012, 06/28/2014   Influenza Whole 07/28/2009, 07/06/2010   Influenza, High Dose Seasonal PF 06/19/2018   Influenza, Seasonal, Injecte, Preservative Fre 06/25/2019   Influenza,inj,Quad PF,6+ Mos 06/23/2013, 07/05/2015, 06/20/2016, 06/23/2017   Influenza-Unspecified 06/28/2014   Moderna Sars-Covid-2 Vaccination 11/27/2019, 12/27/2019, 09/12/2020   Pneumococcal Conjugate-13 06/15/2015   Pneumococcal Polysaccharide-23 10/02/2011   Td 11/23/2013    TDAP status: Up to date  Flu Vaccine status: Up to date  Pneumococcal vaccine status: Up to date  Covid-19 vaccine status: Completed vaccines  Qualifies for Shingles Vaccine? Yes   Zostavax completed No   Shingrix Completed?: No.    Education has been provided regarding the importance of this vaccine. Patient has been advised to call insurance company to determine out of pocket expense if they have not yet received this vaccine. Advised may also receive vaccine at local pharmacy or  Health Dept. Verbalized acceptance and understanding.  Screening Tests Health Maintenance  Topic Date Due   Zoster Vaccines- Shingrix (1 of 2) Never done   COVID-19 Vaccine (4 - Booster for Moderna series) 01/10/2021   INFLUENZA VACCINE  05/28/2021   HEMOGLOBIN A1C  08/22/2021   FOOT EXAM  09/07/2021   OPHTHALMOLOGY EXAM  10/09/2021   TETANUS/TDAP  11/24/2023   DEXA SCAN  Completed   Hepatitis C Screening  Completed   PNA vac Low Risk Adult  Completed   HPV VACCINES  Aged Out    Health Maintenance  Health Maintenance Due  Topic Date Due   Zoster Vaccines- Shingrix (1 of 2) Never done   COVID-19 Vaccine (4 - Booster for Moderna series) 01/10/2021    Colorectal cancer screening: No longer required.   Mammogram status: Completed Bilateral 04/05/2021. Repeat every year  {Bone Density status:21018021}  Lung Cancer Screening: (Low Dose CT Chest recommended if Age 7-80 years, 30 pack-year currently smoking OR have quit w/in 15years.) does not qualify.    Additional Screening:  Hepatitis C Screening: Completed 02/22/2016  Vision Screening: Recommended annual ophthalmology exams for early detection of glaucoma and other disorders of the eye. Is the patient up to date with their annual eye exam?  {YES/NO:21197} Who is the provider or what  is the name of the office in which the patient attends annual eye exams? *** If pt is not established with a provider, would they like to be referred to a provider to establish care? {YES/NO:21197}.   Dental Screening: Recommended annual dental exams for proper oral hygiene  Community Resource Referral / Chronic Care Management: CRR required this visit?  {YES/NO:21197}  CCM required this visit?  {YES/NO:21197}     Plan:     I have personally reviewed and noted the following in the patient's chart:   Medical and social history Use of alcohol, tobacco or illicit drugs  Current medications and supplements including opioid prescriptions.   Functional ability and status Nutritional status Physical activity Advanced directives List of other physicians Hospitalizations, surgeries, and ER visits in previous 12 months Vitals Screenings to include cognitive, depression, and falls Referrals and appointments  In addition, I have reviewed and discussed with patient certain preventive protocols, quality metrics, and best practice recommendations. A written personalized care plan for preventive services as well as general preventive health recommendations were provided to patient.   Due to this being a telephonic visit, the after visit summary with patients personalized plan was offered to patient via mail or my-chart. ***Patient declined at this time./ Patient would like to access on my-chart/ per request, patient was mailed a copy of AVS./ Patient preferred to pick up at office at next visit.   Marta Antu, LPN   [phone]  Nurse Health Advisor  Nurse Notes: ***

## 2021-04-25 ENCOUNTER — Ambulatory Visit (INDEPENDENT_AMBULATORY_CARE_PROVIDER_SITE_OTHER): Payer: Medicare Other

## 2021-04-25 VITALS — Ht 60.0 in | Wt 155.0 lb

## 2021-04-25 DIAGNOSIS — Z0000 Encounter for general adult medical examination without abnormal findings: Secondary | ICD-10-CM | POA: Diagnosis not present

## 2021-04-25 NOTE — Patient Instructions (Signed)
Heidi Franklin , Thank you for taking time to complete your Medicare Wellness Visit. I appreciate your ongoing commitment to your health goals. Please review the following plan we discussed and let me know if I can assist you in the future.   Screening recommendations/referrals: Colonoscopy: No longer required Mammogram: Completed-04/05/2021-Due-04/05/2022 Bone Density: Due-Declined today. Please call the office when you are ready to schedule. Recommended yearly ophthalmology/optometry visit for glaucoma screening and checkup Recommended yearly dental visit for hygiene and checkup  Vaccinations: Influenza vaccine: Up to date Pneumococcal vaccine: Up to date Tdap vaccine: Up to date-Due 11/24/2023 Shingles vaccine: Discuss with pharmacy   Covid-19:Up to date  Advanced directives: Declined information today.  Conditions/risks identified: See problem list  Next appointment: Follow up in one year for your annual wellness visit 05/01/2022 @ 9:00   Preventive Care 76 Years and Older, Female Preventive care refers to lifestyle choices and visits with your health care provider that can promote health and wellness. What does preventive care include? A yearly physical exam. This is also called an annual well check. Dental exams once or twice a year. Routine eye exams. Ask your health care provider how often you should have your eyes checked. Personal lifestyle choices, including: Daily care of your teeth and gums. Regular physical activity. Eating a healthy diet. Avoiding tobacco and drug use. Limiting alcohol use. Practicing safe sex. Taking low-dose aspirin every day. Taking vitamin and mineral supplements as recommended by your health care provider. What happens during an annual well check? The services and screenings done by your health care provider during your annual well check will depend on your age, overall health, lifestyle risk factors, and family history of disease. Counseling  Your  health care provider may ask you questions about your: Alcohol use. Tobacco use. Drug use. Emotional well-being. Home and relationship well-being. Sexual activity. Eating habits. History of falls. Memory and ability to understand (cognition). Work and work Statistician. Reproductive health. Screening  You may have the following tests or measurements: Height, weight, and BMI. Blood pressure. Lipid and cholesterol levels. These may be checked every 5 years, or more frequently if you are over 10 years old. Skin check. Lung cancer screening. You may have this screening every year starting at age 101 if you have a 30-pack-year history of smoking and currently smoke or have quit within the past 15 years. Fecal occult blood test (FOBT) of the stool. You may have this test every year starting at age 63. Flexible sigmoidoscopy or colonoscopy. You may have a sigmoidoscopy every 5 years or a colonoscopy every 10 years starting at age 15. Hepatitis C blood test. Hepatitis B blood test. Sexually transmitted disease (STD) testing. Diabetes screening. This is done by checking your blood sugar (glucose) after you have not eaten for a while (fasting). You may have this done every 1-3 years. Bone density scan. This is done to screen for osteoporosis. You may have this done starting at age 64. Mammogram. This may be done every 1-2 years. Talk to your health care provider about how often you should have regular mammograms. Talk with your health care provider about your test results, treatment options, and if necessary, the need for more tests. Vaccines  Your health care provider may recommend certain vaccines, such as: Influenza vaccine. This is recommended every year. Tetanus, diphtheria, and acellular pertussis (Tdap, Td) vaccine. You may need a Td booster every 10 years. Zoster vaccine. You may need this after age 31. Pneumococcal 13-valent conjugate (PCV13) vaccine. One  dose is recommended after age  21. Pneumococcal polysaccharide (PPSV23) vaccine. One dose is recommended after age 20. Talk to your health care provider about which screenings and vaccines you need and how often you need them. This information is not intended to replace advice given to you by your health care provider. Make sure you discuss any questions you have with your health care provider. Document Released: 11/10/2015 Document Revised: 07/03/2016 Document Reviewed: 08/15/2015 Elsevier Interactive Patient Education  2017 [place] Prevention in the Home Falls can cause injuries. They can happen to people of all ages. There are many things you can do to make your home safe and to help prevent falls. What can I do on the outside of my home? Regularly fix the edges of walkways and driveways and fix any cracks. Remove anything that might make you trip as you walk through a door, such as a raised step or threshold. Trim any bushes or trees on the path to your home. Use bright outdoor lighting. Clear any walking paths of anything that might make someone trip, such as rocks or tools. Regularly check to see if handrails are loose or broken. Make sure that both sides of any steps have handrails. Any raised decks and porches should have guardrails on the edges. Have any leaves, snow, or ice cleared regularly. Use sand or salt on walking paths during winter. Clean up any spills in your garage right away. This includes oil or grease spills. What can I do in the bathroom? Use night lights. Install grab bars by the toilet and in the tub and shower. Do not use towel bars as grab bars. Use non-skid mats or decals in the tub or shower. If you need to sit down in the shower, use a plastic, non-slip stool. Keep the floor dry. Clean up any water that spills on the floor as soon as it happens. Remove soap buildup in the tub or shower regularly. Attach bath mats securely with double-sided non-slip rug tape. Do not have throw  rugs and other things on the floor that can make you trip. What can I do in the bedroom? Use night lights. Make sure that you have a light by your bed that is easy to reach. Do not use any sheets or blankets that are too big for your bed. They should not hang down onto the floor. Have a firm chair that has side arms. You can use this for support while you get dressed. Do not have throw rugs and other things on the floor that can make you trip. What can I do in the kitchen? Clean up any spills right away. Avoid walking on wet floors. Keep items that you use a lot in easy-to-reach places. If you need to reach something above you, use a strong step stool that has a grab bar. Keep electrical cords out of the way. Do not use floor polish or wax that makes floors slippery. If you must use wax, use non-skid floor wax. Do not have throw rugs and other things on the floor that can make you trip. What can I do with my stairs? Do not leave any items on the stairs. Make sure that there are handrails on both sides of the stairs and use them. Fix handrails that are broken or loose. Make sure that handrails are as long as the stairways. Check any carpeting to make sure that it is firmly attached to the stairs. Fix any carpet that is loose or worn.  Avoid having throw rugs at the top or bottom of the stairs. If you do have throw rugs, attach them to the floor with carpet tape. Make sure that you have a light switch at the top of the stairs and the bottom of the stairs. If you do not have them, ask someone to add them for you. What else can I do to help prevent falls? Wear shoes that: Do not have high heels. Have rubber bottoms. Are comfortable and fit you well. Are closed at the toe. Do not wear sandals. If you use a stepladder: Make sure that it is fully opened. Do not climb a closed stepladder. Make sure that both sides of the stepladder are locked into place. Ask someone to hold it for you, if  possible. Clearly mark and make sure that you can see: Any grab bars or handrails. First and last steps. Where the edge of each step is. Use tools that help you move around (mobility aids) if they are needed. These include: Canes. Walkers. Scooters. Crutches. Turn on the lights when you go into a dark area. Replace any light bulbs as soon as they burn out. Set up your furniture so you have a clear path. Avoid moving your furniture around. If any of your floors are uneven, fix them. If there are any pets around you, be aware of where they are. Review your medicines with your doctor. Some medicines can make you feel dizzy. This can increase your chance of falling. Ask your doctor what other things that you can do to help prevent falls. This information is not intended to replace advice given to you by your health care provider. Make sure you discuss any questions you have with your health care provider. Document Released: 08/10/2009 Document Revised: 03/21/2016 Document Reviewed: 11/18/2014 Elsevier Interactive Patient Education  2017 [employer].

## 2021-04-26 DIAGNOSIS — L400 Psoriasis vulgaris: Secondary | ICD-10-CM | POA: Diagnosis not present

## 2021-05-09 ENCOUNTER — Other Ambulatory Visit: Payer: Self-pay | Admitting: Family Medicine

## 2021-05-10 DIAGNOSIS — L400 Psoriasis vulgaris: Secondary | ICD-10-CM | POA: Diagnosis not present

## 2021-05-24 DIAGNOSIS — L400 Psoriasis vulgaris: Secondary | ICD-10-CM | POA: Diagnosis not present

## 2021-06-01 ENCOUNTER — Other Ambulatory Visit: Payer: Self-pay | Admitting: Family Medicine

## 2021-06-06 ENCOUNTER — Telehealth: Payer: Self-pay | Admitting: Pulmonary Disease

## 2021-06-06 MED ORDER — BUDESONIDE-FORMOTEROL FUMARATE 160-4.5 MCG/ACT IN AERO: 2.0000 | INHALATION_SPRAY | Freq: Two times a day (BID) | RESPIRATORY_TRACT | 12 refills | Status: DC

## 2021-06-06 NOTE — Telephone Encounter (Signed)
Call made to patient, confirmed DOB. Patient states her breo is too expensive and she needed a different inhaler that was affordable. I explained that she will need to call her insurance and find out what inhalers are on her formulary so the provider will know what he can choose from. Patient went on to report her disgust with [place] asking her to do something that should be our job. I explained to the patient that if we continue sending in inhalers that are not covered or too expensive it delays her treatment time and essentially causes a lot of confusion for both the patient, pharmacy, and insurance company. She continued to repeat her disgust with [place] asking her to call her insurance. Requested that we ask provider to send her in something else.   MH please advise.

## 2021-06-06 NOTE — Telephone Encounter (Signed)
Call made to patient, confirmed DOB. Made aware of MH recommendations. Aware Symbicort has been sent in. Voiced understanding.   Nothing further needed at this time.

## 2021-06-06 NOTE — Telephone Encounter (Signed)
I agree with advice given regarding contacting insurance provider for preferred option. I have no way of knowing cost. If cost is too much for new inhaler, Symbicort, then I recommend she ask the pharmacy which is the preferred agent. If cost remains an issue she can fill out paperwork for manufacturing assistance and we can send that in with a new prescription.

## 2021-06-07 DIAGNOSIS — L400 Psoriasis vulgaris: Secondary | ICD-10-CM | POA: Diagnosis not present

## 2021-06-15 ENCOUNTER — Other Ambulatory Visit: Payer: Self-pay

## 2021-06-15 ENCOUNTER — Ambulatory Visit (INDEPENDENT_AMBULATORY_CARE_PROVIDER_SITE_OTHER): Payer: Medicare Other | Admitting: Family

## 2021-06-15 VITALS — BP 134/60 | HR 100 | Temp 98.5°F | Ht 60.0 in | Wt 157.6 lb

## 2021-06-15 DIAGNOSIS — M62838 Other muscle spasm: Secondary | ICD-10-CM

## 2021-06-15 MED ORDER — TIZANIDINE HCL 2 MG PO TABS: 2.0000 mg | ORAL_TABLET | Freq: Three times a day (TID) | ORAL | 0 refills | Status: DC | PRN

## 2021-06-15 NOTE — Patient Instructions (Signed)
Muscle Cramps and Spasms Muscle cramps and spasms are when muscles tighten by themselves. They usually get better within minutes. Muscle cramps are painful. They are usually stronger and last longer than muscle spasms. Muscle spasms may or may not be painful.They can last a few seconds or much longer. Cramps and spasms can affect any muscle, but they occur most often in the calf muscles of the leg. They are usually not caused by a serious problem. In many cases, the cause is not known. Some common causes include: Doing more physical work or exercise than your body is ready for. Using the muscles too much (overuse) by repeating certain movements too many times. Staying in a certain position for a long time. Playing a sport or doing an activity without preparing properly. Using bad form or technique while playing a sport or doing an activity. Not having enough water in your body (dehydration). Injury. Side effects of some medicines. Low levels of the salts and minerals in your blood (electrolytes), such as low potassium or calcium. Follow these instructions at home: Managing pain and stiffness     Massage, stretch, and relax the muscle. Do this for many minutes at a time. If told, put heat on tight or tense muscles as often as told by your doctor. Use the heat source that your doctor recommends, such as a moist heat pack or a heating pad. Place a towel between your skin and the heat source. Leave the heat on for 20-30 minutes. Remove the heat if your skin turns bright red. This is very important if you are not able to feel pain, heat, or cold. You may have a greater risk of getting burned. If told, put ice on the affected area. This may help if you are sore or have pain after a cramp or spasm. Put ice in a plastic bag. Place a towel between your skin and the bag. Leave the ice on for 20 minutes, 2-3 times a day. Try taking hot showers or baths to help relax tight muscles. Eating and  drinking Drink enough fluid to keep your pee (urine) pale yellow. Eat a healthy diet to help ensure that your muscles work well. This should include: Fruits and vegetables. Lean protein. Whole grains. Low-fat or nonfat dairy products. General instructions If you are having cramps often, avoid intense exercise for several days. Take over-the-counter and prescription medicines only as told by your doctor. Watch for any changes in your symptoms. Keep all follow-up visits as told by your doctor. This is important. Contact a doctor if: Your cramps or spasms get worse or happen more often. Your cramps or spasms do not get better with time. Summary Muscle cramps and spasms are when muscles tighten by themselves. They usually get better within minutes. Cramps and spasms occur most often in the calf muscles of the leg. Massage, stretch, and relax the muscle. This may help the cramp or spasm go away. Drink enough fluid to keep your pee (urine) pale yellow. This information is not intended to replace advice given to you by your health care provider. Make sure you discuss any questions you have with your healthcare provider. Document Revised: 03/09/2018 Document Reviewed: 03/09/2018 Elsevier Patient Education  [name].

## 2021-06-15 NOTE — Progress Notes (Signed)
Heidi Franklin is a 75 y.o. female with the following history as recorded in Epic[employer]:  Patient Active Problem List   Diagnosis Date Noted   Pedal edema 03/27/2020   Toe ulcer ([place]) 03/10/2020   Body mass index (BMI) 31.0-31.9, adult 11/08/2019   Disc displacement, lumbar 11/08/2019   Strain of lumbar paraspinal muscle 11/08/2019   History of chicken pox 04/11/2019   Vitamin D deficiency 04/05/2019   Lumbar radiculopathy 03/31/2019   Neck pain 07/06/2018   Trapezius muscle strain, right, initial encounter 06/23/2018   Edema 06/23/2018   Acute gout 04/08/2018   Herpes simplex 02/10/2018   ETD (eustachian tube dysfunction) 02/10/2018   Peroneal tendinitis, right 01/06/2018   Psoriasis 11/14/2017   Acute bursitis of right shoulder 09/29/2017   Bilateral hip pain 07/10/2017   Bronchiectasis without complication ([place]) [id number]   Irritable larynx syndrome 04/25/2017   Reflux laryngitis 04/23/2017   Benign lipomatous neoplasm of skin and subcutaneous tissue of right leg 02/25/2017   Anemia 02/05/2017   Fatigue 01/23/2017   Cough 01/02/2017   Multiple pulmonary nodules 11/10/2016   Chronic low back pain 10/09/2016   Degenerative arthritis of right knee 06/20/2016   Degenerative arthritis of left knee 04/10/2016   Bronchitis 12/15/2015   Peroneal tendinitis of left lower extremity 09/05/2015   Recurrent falls 08/08/2015   Bursitis of left shoulder 07/13/2015   Morbid obesity due to excess calories ([place]) 04/16/2015   Nonspecific abnormal electrocardiogram (ECG) (EKG) 05/11/2011   Tenosynovitis of finger 04/22/2011   Type 2 diabetes, uncontrolled, with neuropathy ([place]) 03/02/2010   Hyperlipidemia associated with type 2 diabetes mellitus ([place]) 03/02/2010   Depression with anxiety 03/02/2010   Essential hypertension 03/02/2010   Chronic rhinitis 03/02/2010   Upper airway cough syndrome 03/02/2010   GERD 03/02/2010    Current Outpatient Medications  Medication Sig Dispense Refill    acetaminophen (TYLENOL) 325 MG tablet Take 650 mg by mouth every 6 (six) hours as needed.     albuterol (VENTOLIN HFA) 108 (90 Base) MCG/ACT inhaler Inhale 1-2 puffs into the lungs every 6 (six) hours as needed for wheezing or shortness of breath. 18 g 3   ALPRAZolam (XANAX) 0.5 MG tablet Take 1 tablet (0.5 mg total) by mouth 2 (two) times daily as needed for anxiety. 60 tablet 2   amLODipine (NORVASC) 10 MG tablet TAKE 1 TABLET BY MOUTH EVERY DAY 90 tablet 1   betamethasone dipropionate (DIPROLENE) 0.05 % ointment Apply to affected areas twice a day and then as needed flare/itch     Cholecalciferol (VITAMIN D3) 50 MCG (2000 UT) capsule Take 2,000 Units by mouth daily.     fluticasone (CUTIVATE) 0.05 % cream APPLY TO AFFECTED AREA TWICE A DAY 1 WEEK ON 1 WEEK OFF PRN FLARE     fluticasone (FLONASE) 50 MCG/ACT nasal spray SPRAY 2 SPRAYS INTO EACH NOSTRIL EVERY DAY 48 mL 2   glimepiride (AMARYL) 1 MG tablet TAKE 1 TABLET BY MOUTH EVERY DAY WITH BREAKFAST 90 tablet 1   glucose blood (ONE TOUCH ULTRA TEST) test strip 1 each by Other route daily. Use 1 strips to check blood sugar twice a day Ex E11.9 100 each 3   HUMIRA PEN-PS/UV/ADOL HS START 40 MG/0.8ML PNKT Every 2 weeks     ketoconazole (NIZORAL) 2 % cream      Lancets (ONETOUCH ULTRASOFT) lancets PRN 100 each 3   losartan-hydrochlorothiazide (HYZAAR) 100-12.5 MG tablet TAKE 1 TABLET BY MOUTH EVERY DAY 90 tablet 1   lovastatin (  MEVACOR) 20 MG tablet TAKE 1 TABLET BY MOUTH EVERYDAY AT BEDTIME 90 tablet 0   mometasone (ELOCON) 0.1 % ointment      montelukast (SINGULAIR) 10 MG tablet TAKE 1 TABLET BY MOUTH EVERYDAY AT BEDTIME 90 tablet 0   Multiple Vitamin (MULTI-VITAMIN) tablet      Multiple Vitamins-Minerals (CENTRUM SILVER PO) Take 1 tablet by mouth every morning.     pantoprazole (PROTONIX) 40 MG tablet Take 1 tablet (40 mg total) by mouth daily. 90 tablet 3   Potassium Chloride ER 20 MEQ TBCR TAKE 1 TABLET BY MOUTH EVERY DAY 90 tablet 1    promethazine-dextromethorphan (PROMETHAZINE-DM) 6.25-15 MG/5ML syrup TAKE 2.5-5 MLS BY MOUTH 3 (THREE) TIMES DAILY AS NEEDED FOR COUGH. 180 mL 0   valACYclovir (VALTREX) 500 MG tablet Take 500 mg by mouth 2 (two) times daily.  0   benzonatate (TESSALON) 100 MG capsule Take 1 capsule (100 mg total) by mouth 2 (two) times daily as needed for cough. (Patient not taking: Reported on 06/15/2021) 20 capsule 0   budesonide-formoterol (SYMBICORT) 160-4.5 MCG/ACT inhaler Inhale 2 puffs into the lungs 2 (two) times daily. (Patient not taking: Reported on 06/15/2021) 1 each 12   hydrochlorothiazide (HYDRODIURIL) 12.5 MG tablet  (Patient not taking: No sig reported)     losartan (COZAAR) 100 MG tablet TAKE 1 TABLET BY MOUTH EVERY DAY (Patient not taking: Reported on 06/15/2021) 90 tablet 0   meloxicam (MOBIC) 15 MG tablet Take 1 tablet (15 mg total) by mouth daily. (Patient not taking: Reported on 06/15/2021) 30 tablet 1   tiZANidine (ZANAFLEX) 2 MG tablet Take 1 tablet (2 mg total) by mouth every 8 (eight) hours as needed for muscle spasms. 30 tablet 0   No current facility-administered medications for this visit.    Allergies: Metformin and related and Penicillins  Past Medical History:  Diagnosis Date   ALLERGIC RHINITIS    Allergy    Anxiety state, unspecified    Arthritis    Asthma    BRONCHITIS, CHRONIC    Cataract    b/l   Chronic low back pain 10/09/2016   DEPRESSION    DIABETES MELLITUS, TYPE II    GERD    HYPERLIPIDEMIA    HYPERTENSION    Pneumonia    Psoriasis     Past Surgical History:  Procedure Laterality Date   ABDOMINAL HYSTERECTOMY  1980   CARPAL TUNNEL RELEASE Right    TRIGGER FINGER RELEASE Left    Index finger    Family History  Problem Relation Age of Onset   Stroke Mother    Angina Mother    Hyperlipidemia Mother    Hypertension Mother    Heart disease Mother    Depression Mother    Diabetes Father    Hyperlipidemia Other        Parent   Hypertension Other         Parent   Diabetes Sister        x1   CAD Sister    COPD Sister        smoker   Hyperlipidemia Sister    Hypertension Sister    Diabetes Brother        x 2   Hyperlipidemia Brother    Hypertension Brother    Diabetes Brother    Hyperlipidemia Brother    Hypertension Brother    CAD Sister    Arthritis Sister    Hyperlipidemia Sister    Hypertension Sister    Colon  cancer Neg Hx    Esophageal cancer Neg Hx    Rectal cancer Neg Hx    Stomach cancer Neg Hx    Pancreatic cancer Neg Hx     Social History   Tobacco Use   Smoking status: Never   Smokeless tobacco: Never  Substance Use Topics   Alcohol use: No    Alcohol/week: 0.0 standard drinks    Subjective:   Neck pain ( left side) x 1 week; feels like a "knot." Thinks she may have slept wrong; has been applying heating pad;     Objective:  Vitals:   06/15/21 1038  BP: 134/60  Pulse: 100  Temp: 98.5 F (36.9 C)  TempSrc: Oral  SpO2: 99%  Weight: 157 lb 9.6 oz (71.5 kg)  Height: 5' (1.524 m)    General: Well developed, well nourished, in no acute distress  Skin : Warm and dry.  Head: Normocephalic and atraumatic  Lungs: Respirations unlabored;  Musculoskeletal: No deformities; no active joint inflammation  Extremities: No edema, cyanosis, clubbing  Vessels: Symmetric bilaterally  Neurologic: Alert and oriented; speech intact; face symmetrical; moves all extremities well; CNII-XII intact without focal deficit   Assessment:  1. Muscle spasm     Plan:  Reassurance; continue heating pad prn; refill on Tizanidine which she has used in the past; follow up worse, no better.   This visit occurred during the SARS-CoV-2 public health emergency.  Safety protocols were in place, including screening questions prior to the visit, additional usage of staff PPE, and extensive cleaning of exam room while observing appropriate contact time as indicated for disinfecting solutions.    Return for October/ November 2022  with Dr. Charlett Blake; 6 months diabetes follow up.  No orders of the defined types were placed in this encounter.   Requested Prescriptions   Signed Prescriptions Disp Refills   tiZANidine (ZANAFLEX) 2 MG tablet 30 tablet 0    Sig: Take 1 tablet (2 mg total) by mouth every 8 (eight) hours as needed for muscle spasms.

## 2021-06-19 ENCOUNTER — Other Ambulatory Visit: Payer: Self-pay | Admitting: Family Medicine

## 2021-06-19 DIAGNOSIS — J310 Chronic rhinitis: Secondary | ICD-10-CM

## 2021-06-19 DIAGNOSIS — R059 Cough, unspecified: Secondary | ICD-10-CM

## 2021-06-21 DIAGNOSIS — Z79899 Other long term (current) drug therapy: Secondary | ICD-10-CM | POA: Diagnosis not present

## 2021-06-21 DIAGNOSIS — K120 Recurrent oral aphthae: Secondary | ICD-10-CM | POA: Diagnosis not present

## 2021-06-21 DIAGNOSIS — L409 Psoriasis, unspecified: Secondary | ICD-10-CM | POA: Diagnosis not present

## 2021-06-21 DIAGNOSIS — L400 Psoriasis vulgaris: Secondary | ICD-10-CM | POA: Diagnosis not present

## 2021-06-25 ENCOUNTER — Telehealth: Payer: Self-pay

## 2021-06-25 NOTE — Telephone Encounter (Signed)
Pt called stating she is still dealing with chronic cough.  She has a little bit of the Promethazine left and is wondering what else can be prescribed for her.  She has also been taking OTC allergy meds and Mucinex.  She is not getting any better.

## 2021-06-26 ENCOUNTER — Telehealth: Payer: Self-pay | Admitting: Pulmonary Disease

## 2021-06-26 MED ORDER — FLUTICASONE FUROATE-VILANTEROL 200-25 MCG/INH IN AEPB: 1.0000 | INHALATION_SPRAY | Freq: Every day | RESPIRATORY_TRACT | 0 refills | Status: DC

## 2021-06-26 NOTE — Telephone Encounter (Signed)
Breo 200 sample placed up at front desk for Patient to pick up.  Nothing further at this time.

## 2021-06-26 NOTE — Telephone Encounter (Signed)
Ok with sample of Breo 200

## 2021-06-26 NOTE — Telephone Encounter (Signed)
Patient is aware of below message and voiced his understanding.  She will pickup tomorrow.   Triage, please place sample of Breo 200 upfront for pickup.

## 2021-06-26 NOTE — Telephone Encounter (Signed)
Call returned to patient, confirmed DOB. She reports she has contacted her insurance and all of the inhalers on their formulary are 3 tier and will be too expensive for the patient. Insurance company gave her a list of companies to call to get help with her inhalers.  Patient has not yet filled out a patient assistance application. I made her aware I would send her a patient assistance application for the symbicort. In the mean time she is still requesting something since she is still coughing. She reports the cough is dry but it just will not stop.   MH please advise patient requesting samples of Breo until she is able to figure out what to do regarding her inhalers and affordability. If so Breo 100 or 200? Thanks :)

## 2021-06-27 ENCOUNTER — Ambulatory Visit (INDEPENDENT_AMBULATORY_CARE_PROVIDER_SITE_OTHER): Payer: Medicare Other | Admitting: Internal Medicine

## 2021-06-27 ENCOUNTER — Other Ambulatory Visit: Payer: Self-pay

## 2021-06-27 ENCOUNTER — Encounter: Payer: Self-pay | Admitting: Internal Medicine

## 2021-06-27 ENCOUNTER — Telehealth: Payer: Self-pay | Admitting: Pulmonary Disease

## 2021-06-27 VITALS — BP 142/72 | HR 95 | Temp 98.5°F | Resp 16 | Ht 60.0 in | Wt 159.0 lb

## 2021-06-27 DIAGNOSIS — R053 Chronic cough: Secondary | ICD-10-CM | POA: Diagnosis not present

## 2021-06-27 DIAGNOSIS — J40 Bronchitis, not specified as acute or chronic: Secondary | ICD-10-CM | POA: Diagnosis not present

## 2021-06-27 MED ORDER — PANTOPRAZOLE SODIUM 40 MG PO TBEC: 40.0000 mg | DELAYED_RELEASE_TABLET | Freq: Two times a day (BID) | ORAL | 3 refills | Status: DC

## 2021-06-27 MED ORDER — AZITHROMYCIN 250 MG PO TABS: ORAL_TABLET | ORAL | 0 refills | Status: DC

## 2021-06-27 MED ORDER — HYDROCODONE BIT-HOMATROP MBR 5-1.5 MG/5ML PO SOLN: 5.0000 mL | Freq: Every evening | ORAL | 0 refills | Status: DC | PRN

## 2021-06-27 MED ORDER — PREDNISONE 10 MG PO TABS: ORAL_TABLET | ORAL | 0 refills | Status: DC

## 2021-06-27 NOTE — Telephone Encounter (Signed)
Pt came into the clinic today to pick up sample of Breo and was wanting to know if there is a patient assistance form that can be given to her for the medication. Pls regard; [phone]

## 2021-06-27 NOTE — Progress Notes (Signed)
Subjective:    Patient ID: Heidi Franklin, female    DOB: [birth year], 75 y.o.   MRN: [id number]  DOS:  06/27/2021 Type of visit - description: Acute  Has a history of chronic cough, symptoms increased for the last 2 weeks. The cough is typically dry however the last 2 weeks she has seen clear sputum. She ran out of her inhaler but she started Holly again today. Has used her rescue inhaler few times.  I asked about acid reflux, she takes PPIs with breakfast, still has acid reflux.   Review of Systems No fever chills No runny nose or sore throat. No chest pain. Shortness of breath only with bouts of cough. Still has heartburn on and off Reports no wheezing but some chest rattling.  Past Medical History:  Diagnosis Date   ALLERGIC RHINITIS    Allergy    Anxiety state, unspecified    Arthritis    Asthma    BRONCHITIS, CHRONIC    Cataract    b/l   Chronic low back pain 10/09/2016   DEPRESSION    DIABETES MELLITUS, TYPE II    GERD    HYPERLIPIDEMIA    HYPERTENSION    Pneumonia    Psoriasis     Past Surgical History:  Procedure Laterality Date   ABDOMINAL HYSTERECTOMY  1980   CARPAL TUNNEL RELEASE Right    TRIGGER FINGER RELEASE Left    Index finger    Allergies as of 06/27/2021       Reactions   Metformin And Related Diarrhea   Penicillins Rash        Medication List        Accurate as of June 27, 2021  3:35 PM. If you have any questions, ask your nurse or doctor.          STOP taking these medications    budesonide-formoterol 160-4.5 MCG/ACT inhaler Commonly known as: Symbicort Stopped by: Kathlene November, MD   losartan 100 MG tablet Commonly known as: COZAAR Stopped by: Kathlene November, MD   promethazine-dextromethorphan 6.25-15 MG/5ML syrup Commonly known as: PROMETHAZINE-DM Stopped by: Kathlene November, MD       TAKE these medications    acetaminophen 325 MG tablet Commonly known as: TYLENOL Take 650 mg by mouth every 6 (six) hours as needed.    albuterol 108 (90 Base) MCG/ACT inhaler Commonly known as: VENTOLIN HFA INHALE 1-2 PUFFS INTO THE LUNGS EVERY 6 (SIX) HOURS AS NEEDED FOR WHEEZING OR SHORTNESS OF BREATH.   ALPRAZolam 0.5 MG tablet Commonly known as: XANAX Take 1 tablet (0.5 mg total) by mouth 2 (two) times daily as needed for anxiety.   amLODipine 10 MG tablet Commonly known as: NORVASC TAKE 1 TABLET BY MOUTH EVERY DAY   azithromycin 250 MG tablet Commonly known as: Zithromax Z-Pak 2 tabs a day the first day, then 1 tab a day x 4 days Started by: Kathlene November, MD   benzonatate 100 MG capsule Commonly known as: TESSALON Take 1 capsule (100 mg total) by mouth 2 (two) times daily as needed for cough.   betamethasone dipropionate 0.05 % ointment Commonly known as: DIPROLENE Apply to affected areas twice a day and then as needed flare/itch   CENTRUM SILVER PO Take 1 tablet by mouth every morning.   fluticasone 0.05 % cream Commonly known as: CUTIVATE APPLY TO AFFECTED AREA TWICE A DAY 1 WEEK ON 1 WEEK OFF PRN FLARE   fluticasone 50 MCG/ACT nasal spray Commonly known as: FLONASE SPRAY  2 SPRAYS INTO EACH NOSTRIL EVERY DAY   fluticasone furoate-vilanterol 200-25 MCG/INH Aepb Commonly known as: Breo Ellipta Inhale 1 puff into the lungs daily.   glimepiride 1 MG tablet Commonly known as: AMARYL TAKE 1 TABLET BY MOUTH EVERY DAY WITH BREAKFAST   glucose blood test strip Commonly known as: ONE TOUCH ULTRA TEST 1 each by Other route daily. Use 1 strips to check blood sugar twice a day Ex E11.9   Humira Pen-Ps/UV/Adol HS Start 40 MG/0.8ML Pnkt Generic drug: Adalimumab Every 2 weeks   hydrochlorothiazide 12.5 MG tablet Commonly known as: HYDRODIURIL   HYDROcodone bit-homatropine 5-1.5 MG/5ML syrup Commonly known as: HYCODAN Take 5 mLs by mouth at bedtime as needed for cough. Started by: Kathlene November, MD   ketoconazole 2 % cream Commonly known as: NIZORAL   losartan-hydrochlorothiazide 100-12.5 MG  tablet Commonly known as: HYZAAR TAKE 1 TABLET BY MOUTH EVERY DAY   lovastatin 20 MG tablet Commonly known as: MEVACOR TAKE 1 TABLET BY MOUTH EVERYDAY AT BEDTIME   meloxicam 15 MG tablet Commonly known as: MOBIC Take 1 tablet (15 mg total) by mouth daily.   mometasone 0.1 % ointment Commonly known as: ELOCON   montelukast 10 MG tablet Commonly known as: SINGULAIR TAKE 1 TABLET BY MOUTH EVERYDAY AT BEDTIME   Multi-Vitamin tablet   onetouch ultrasoft lancets PRN   pantoprazole 40 MG tablet Commonly known as: PROTONIX Take 1 tablet (40 mg total) by mouth 2 (two) times daily before a meal. What changed: when to take this Changed by: Kathlene November, MD   Potassium Chloride ER 20 MEQ Tbcr TAKE 1 TABLET BY MOUTH EVERY DAY   predniSONE 10 MG tablet Commonly known as: DELTASONE 3 tabs x 3 days, 2 tabs x 3 days, 1 tab x 3 days Started by: Kathlene November, MD   tiZANidine 2 MG tablet Commonly known as: ZANAFLEX Take 1 tablet (2 mg total) by mouth every 8 (eight) hours as needed for muscle spasms.   valACYclovir 500 MG tablet Commonly known as: VALTREX Take 500 mg by mouth 2 (two) times daily.   Vitamin D3 50 MCG (2000 UT) capsule Take 2,000 Units by mouth daily.           Objective:   Physical Exam BP (!) 142/72 (BP Location: Left Arm, Patient Position: Sitting, Cuff Size: Small)   Pulse 95   Temp 98.5 F (36.9 C) (Oral)   Resp 16   Ht 5' (1.524 m)   Wt 159 lb (72.1 kg)   SpO2 97%   BMI 31.05 kg/m  General:   Well developed, NAD, BMI noted. HEENT:  Normocephalic . Face symmetric, atraumatic. TMs: Normal Throat: Not red, no white patches Nose: Slightly congested Lungs:  Few rhonchi with cough, no wheezing Normal respiratory effort, no intercostal retractions, no accessory muscle use. Heart: RRR,  no murmur.  Lower extremities: no pretibial edema bilaterally  Skin: Not pale. Not jaundice Neurologic:  alert & oriented X3.  Speech normal, gait appropriate for age  and unassisted Psych--  Cognition and judgment appear intact.  Cooperative with normal attention span and concentration.  Behavior appropriate. No anxious or depressed appearing.      Assessment     75 year old female, PMH includes diabetes, normal kidney function, high cholesterol, hypertension,Immunosuppressed with Humira due to psoriasis, History of bronchiectasis, presents with: Acute bronchitis: Explained patient that the symptoms for the last 2 weeks are d/t r acute bronchitis, will recommend to continue with her inhalers, low-dose of prednisone, watch  CBGs, also Zithromax. Her cough is not allowing her to sleep so I sent in a small amount of hydrocodone, she knows this will not be a long-term medication as it is a narcotic. Chronic cough: Explained patient this is due to GERD and bronchiectasis. Needs better control of GERD, in crease pantoprazole, to be taking on an empty stomach.  This visit occurred during the SARS-CoV-2 public health emergency.  Safety protocols were in place, including screening questions prior to the visit, additional usage of staff PPE, and extensive cleaning of exam room while observing appropriate contact time as indicated for disinfecting solutions.

## 2021-06-27 NOTE — Telephone Encounter (Signed)
LM (dpr) informing patient patient assistance application has been mailed to her.   Nothing further needed at this time.

## 2021-06-27 NOTE — Patient Instructions (Addendum)
For your current bronchitis: Rest Drink lots of fluids Robitussin-DM or Mucinex DM Continue Breo Use your rescue inhaler as needed Take prednisone for few days.  Check your blood sugar frequently, if they are more than 200 then you need to stop prednisone Take the antibiotic called Zithromax. Okay to take hydrocodone, 1 teaspoon at bedtime if you cannot sleep due to cough.  Chronic cough: That is due to acid reflux and bronchiectasis. Will increase pantoprazole (the acid suppressant) to twice a day before meals.  Please see your primary doctor in November as a schedule. Call sooner if you are not gradually improving.

## 2021-06-28 ENCOUNTER — Encounter: Payer: Self-pay | Admitting: Sports Medicine

## 2021-06-28 ENCOUNTER — Ambulatory Visit (INDEPENDENT_AMBULATORY_CARE_PROVIDER_SITE_OTHER): Payer: Medicare Other | Admitting: Sports Medicine

## 2021-06-28 DIAGNOSIS — B351 Tinea unguium: Secondary | ICD-10-CM

## 2021-06-28 DIAGNOSIS — M79674 Pain in right toe(s): Secondary | ICD-10-CM

## 2021-06-28 DIAGNOSIS — M79675 Pain in left toe(s): Secondary | ICD-10-CM | POA: Diagnosis not present

## 2021-06-28 DIAGNOSIS — M7661 Achilles tendinitis, right leg: Secondary | ICD-10-CM

## 2021-06-28 DIAGNOSIS — E119 Type 2 diabetes mellitus without complications: Secondary | ICD-10-CM

## 2021-06-28 NOTE — Progress Notes (Signed)
Subjective: Heidi Franklin is a 75 y.o. female patient with history of diabetes who returns to office today for nail trim.  Patient is diabetic with blood sugars not recorded this visit.  Reports that her Achilles is doing better off and on but has noticed some discoloration to the area.  Patient denies any other pedal complaints at this time.  Patient Active Problem List   Diagnosis Date Noted   Pedal edema 03/27/2020   Toe ulcer ([place]) 03/10/2020   Body mass index (BMI) 31.0-31.9, adult 11/08/2019   Disc displacement, lumbar 11/08/2019   Strain of lumbar paraspinal muscle 11/08/2019   History of chicken pox 04/11/2019   Vitamin D deficiency 04/05/2019   Lumbar radiculopathy 03/31/2019   Neck pain 07/06/2018   Trapezius muscle strain, right, initial encounter 06/23/2018   Edema 06/23/2018   Acute gout 04/08/2018   Herpes simplex 02/10/2018   ETD (eustachian tube dysfunction) 02/10/2018   Peroneal tendinitis, right 01/06/2018   Psoriasis 11/14/2017   Acute bursitis of right shoulder 09/29/2017   Bilateral hip pain 07/10/2017   Bronchiectasis without complication ([place]) [id number]   Irritable larynx syndrome 04/25/2017   Reflux laryngitis 04/23/2017   Benign lipomatous neoplasm of skin and subcutaneous tissue of right leg 02/25/2017   Anemia 02/05/2017   Fatigue 01/23/2017   Cough 01/02/2017   Multiple pulmonary nodules 11/10/2016   Chronic low back pain 10/09/2016   Degenerative arthritis of right knee 06/20/2016   Degenerative arthritis of left knee 04/10/2016   Bronchitis 12/15/2015   Peroneal tendinitis of left lower extremity 09/05/2015   Recurrent falls 08/08/2015   Bursitis of left shoulder 07/13/2015   Morbid obesity due to excess calories ([place]) 04/16/2015   Nonspecific abnormal electrocardiogram (ECG) (EKG) 05/11/2011   Tenosynovitis of finger 04/22/2011   Type 2 diabetes, uncontrolled, with neuropathy ([place]) 03/02/2010   Hyperlipidemia associated with type 2 diabetes  mellitus ([place]) 03/02/2010   Depression with anxiety 03/02/2010   Essential hypertension 03/02/2010   Chronic rhinitis 03/02/2010   Upper airway cough syndrome 03/02/2010   GERD 03/02/2010   Current Outpatient Medications on File Prior to Visit  Medication Sig Dispense Refill   acetaminophen (TYLENOL) 325 MG tablet Take 650 mg by mouth every 6 (six) hours as needed.     albuterol (VENTOLIN HFA) 108 (90 Base) MCG/ACT inhaler INHALE 1-2 PUFFS INTO THE LUNGS EVERY 6 (SIX) HOURS AS NEEDED FOR WHEEZING OR SHORTNESS OF BREATH. 8.5 each 3   ALPRAZolam (XANAX) 0.5 MG tablet Take 1 tablet (0.5 mg total) by mouth 2 (two) times daily as needed for anxiety. 60 tablet 2   amLODipine (NORVASC) 10 MG tablet TAKE 1 TABLET BY MOUTH EVERY DAY 90 tablet 1   azithromycin (ZITHROMAX Z-PAK) 250 MG tablet 2 tabs a day the first day, then 1 tab a day x 4 days 6 tablet 0   benzonatate (TESSALON) 100 MG capsule Take 1 capsule (100 mg total) by mouth 2 (two) times daily as needed for cough. (Patient not taking: No sig reported) 20 capsule 0   betamethasone dipropionate (DIPROLENE) 0.05 % ointment Apply to affected areas twice a day and then as needed flare/itch     Cholecalciferol (VITAMIN D3) 50 MCG (2000 UT) capsule Take 2,000 Units by mouth daily.     fluticasone (CUTIVATE) 0.05 % cream APPLY TO AFFECTED AREA TWICE A DAY 1 WEEK ON 1 WEEK OFF PRN FLARE     fluticasone (FLONASE) 50 MCG/ACT nasal spray SPRAY 2 SPRAYS INTO EACH NOSTRIL EVERY  DAY 48 mL 2   fluticasone furoate-vilanterol (BREO ELLIPTA) 200-25 MCG/INH AEPB Inhale 1 puff into the lungs daily. 14 each 0   glimepiride (AMARYL) 1 MG tablet TAKE 1 TABLET BY MOUTH EVERY DAY WITH BREAKFAST 90 tablet 1   glucose blood (ONE TOUCH ULTRA TEST) test strip 1 each by Other route daily. Use 1 strips to check blood sugar twice a day Ex E11.9 100 each 3   HUMIRA PEN-PS/UV/ADOL HS START 40 MG/0.8ML PNKT Every 2 weeks     hydrochlorothiazide (HYDRODIURIL) 12.5 MG tablet   (Patient not taking: No sig reported)     HYDROcodone bit-homatropine (HYCODAN) 5-1.5 MG/5ML syrup Take 5 mLs by mouth at bedtime as needed for cough. 90 mL 0   ketoconazole (NIZORAL) 2 % cream      Lancets (ONETOUCH ULTRASOFT) lancets PRN 100 each 3   losartan-hydrochlorothiazide (HYZAAR) 100-12.5 MG tablet TAKE 1 TABLET BY MOUTH EVERY DAY 90 tablet 1   lovastatin (MEVACOR) 20 MG tablet TAKE 1 TABLET BY MOUTH EVERYDAY AT BEDTIME 90 tablet 0   meloxicam (MOBIC) 15 MG tablet Take 1 tablet (15 mg total) by mouth daily. (Patient not taking: No sig reported) 30 tablet 1   mometasone (ELOCON) 0.1 % ointment      montelukast (SINGULAIR) 10 MG tablet TAKE 1 TABLET BY MOUTH EVERYDAY AT BEDTIME 90 tablet 0   Multiple Vitamin (MULTI-VITAMIN) tablet      Multiple Vitamins-Minerals (CENTRUM SILVER PO) Take 1 tablet by mouth every morning.     pantoprazole (PROTONIX) 40 MG tablet Take 1 tablet (40 mg total) by mouth 2 (two) times daily before a meal. 60 tablet 3   Potassium Chloride ER 20 MEQ TBCR TAKE 1 TABLET BY MOUTH EVERY DAY 90 tablet 1   predniSONE (DELTASONE) 10 MG tablet 3 tabs x 3 days, 2 tabs x 3 days, 1 tab x 3 days 18 tablet 0   tiZANidine (ZANAFLEX) 2 MG tablet Take 1 tablet (2 mg total) by mouth every 8 (eight) hours as needed for muscle spasms. 30 tablet 0   valACYclovir (VALTREX) 500 MG tablet Take 500 mg by mouth 2 (two) times daily.  0   No current facility-administered medications on file prior to visit.   Allergies  Allergen Reactions   Metformin And Related Diarrhea   Penicillins Rash    Recent Results (from the past 2160 hour(s))  HM MAMMOGRAPHY     Status: None   Collection Time: 04/05/21 12:00 AM  Result Value Ref Range   HM Mammogram 0-4 Bi-Rad 0-4 Bi-Rad, Self Reported Normal    Objective: General: Patient is awake, alert, and oriented x 3 and in no acute distress.  Integument: Skin is warm, dry and supple bilateral. Nails are tender, long, thickened and dystrophic  with subungual debris, consistent with onychomycosis, 1-5 bilateral.  No signs of infection. No open lesions or preulcerative lesions present bilateral. Remaining integument unremarkable.  Vasculature:  Dorsalis Pedis pulse 2/4 bilateral. Posterior Tibial pulse  1/4 bilateral.  Capillary fill time <3 sec 1-5 bilateral. Positive hair growth to the level of the digits. Temperature gradient within normal limits. No varicosities present bilateral. No edema present bilateral.   Neurology: The patient has intact sensation measured with a 5.07/10g Semmes Weinstein Monofilament at all pedal sites bilateral . Vibratory sensation intact bilateral with tuning fork. No Babinski sign present bilateral.  Unchanged from prior.  Musculoskeletal: Asymptomatic pes planus pedal deformities noted bilateral.  There is r decreased pain to the Achilles at  the lateral insertion Achilles appears to be intact with no palpable dell mild discoloration to the skin from previous steroid shot to the area.  Thompson sign negative.  Muscular strength 5/5 in all lower extremity muscular groups bilateral without pain on range of motion except at Achilles on right. No tenderness with calf compression bilateral.  Assessment and Plan: Problem List Items Addressed This Visit   None Visit Diagnoses     Pain due to onychomycosis of toenails of both feet    -  Primary   Diabetes mellitus without complication (HCC)       Achilles tendinitis, right leg          -Examined patient -Re-Discussed and educated patient on diabetic foot care, especially with  regards to the vascular, neurological and musculoskeletal systems.  -Mechanically debrided all nails 1-5 bilateral using sterile nail nipper and filed with dremel without incident  -Advised patient to continue with heel lifts for Achilles tendinitis for prevention even though pain is much better -Answered all patient questions -Return in 3 months for nail care or sooner if problems  arise -Patient advised to call the office if any problems or questions arise in the meantime.  Landis Martins, DPM

## 2021-07-05 ENCOUNTER — Other Ambulatory Visit: Payer: Self-pay | Admitting: Family Medicine

## 2021-07-06 DIAGNOSIS — L400 Psoriasis vulgaris: Secondary | ICD-10-CM | POA: Diagnosis not present

## 2021-07-12 ENCOUNTER — Other Ambulatory Visit: Payer: Self-pay

## 2021-07-12 MED ORDER — FLUTICASONE FUROATE-VILANTEROL 200-25 MCG/INH IN AEPB
1.0000 | INHALATION_SPRAY | Freq: Every day | RESPIRATORY_TRACT | 3 refills | Status: DC
Start: 2021-07-12 — End: 2021-09-22

## 2021-07-17 ENCOUNTER — Other Ambulatory Visit: Payer: Self-pay

## 2021-07-17 ENCOUNTER — Ambulatory Visit (INDEPENDENT_AMBULATORY_CARE_PROVIDER_SITE_OTHER): Payer: Medicare Other

## 2021-07-17 DIAGNOSIS — Z23 Encounter for immunization: Secondary | ICD-10-CM

## 2021-07-18 ENCOUNTER — Other Ambulatory Visit: Payer: Self-pay | Admitting: Family Medicine

## 2021-07-20 DIAGNOSIS — L400 Psoriasis vulgaris: Secondary | ICD-10-CM | POA: Diagnosis not present

## 2021-07-25 ENCOUNTER — Telehealth: Payer: Self-pay | Admitting: Pulmonary Disease

## 2021-07-26 NOTE — Telephone Encounter (Signed)
Patient is returning phone call. Patient phone number is [phone].

## 2021-07-26 NOTE — Telephone Encounter (Signed)
I called [facility] and spoke with [place] regarding information still being needed. Kenney Houseman said they are needing a copy of Medicare Pt. D card, front and back, and a copy of out of pocket expense of 600 or more. ATC patient to let her know, no answer, left VM

## 2021-07-26 NOTE — Telephone Encounter (Signed)
I called and spoke with patient regarding [place] information. Patient verbalized understanding and will get them the information. Nothing further needed.

## 2021-07-30 ENCOUNTER — Telehealth: Payer: Self-pay | Admitting: Pulmonary Disease

## 2021-07-30 NOTE — Telephone Encounter (Signed)
Left message for patient to call back  

## 2021-07-31 ENCOUNTER — Other Ambulatory Visit: Payer: Self-pay

## 2021-07-31 ENCOUNTER — Ambulatory Visit (INDEPENDENT_AMBULATORY_CARE_PROVIDER_SITE_OTHER): Payer: Medicare Other | Admitting: Adult Health

## 2021-07-31 ENCOUNTER — Ambulatory Visit (INDEPENDENT_AMBULATORY_CARE_PROVIDER_SITE_OTHER): Payer: Medicare Other

## 2021-07-31 ENCOUNTER — Encounter: Payer: Self-pay | Admitting: Adult Health

## 2021-07-31 VITALS — BP 120/56 | HR 101 | Temp 98.7°F | Ht 60.0 in | Wt 161.4 lb

## 2021-07-31 DIAGNOSIS — J479 Bronchiectasis, uncomplicated: Secondary | ICD-10-CM | POA: Diagnosis not present

## 2021-07-31 DIAGNOSIS — J40 Bronchitis, not specified as acute or chronic: Secondary | ICD-10-CM | POA: Diagnosis not present

## 2021-07-31 DIAGNOSIS — R053 Chronic cough: Secondary | ICD-10-CM | POA: Diagnosis not present

## 2021-07-31 MED ORDER — PREDNISONE 20 MG PO TABS: 20.0000 mg | ORAL_TABLET | Freq: Every day | ORAL | 0 refills | Status: DC

## 2021-07-31 NOTE — Telephone Encounter (Signed)
Spoke with the pt  She states her cough has not improved any since her last visit  She has had some DOE past 2-3 wks and occ wheezing  Covid test she took a few days after symptoms started neg  No f/c/s, aches  OV today with TP

## 2021-07-31 NOTE — Patient Instructions (Addendum)
Chest xray today.  Prednisone 20mg  daily for days.  Begin Robitussin DM 2 tsp every 4hrs as needed for cough  Tessalon Three times a day  As needed  for cough .  Begin Flutter valve Three times a day  for cough  Begin Zyrtec 10mg  At bedtime  .  Albuterol inhaler As needed   May stop BREO .  Follow up with Dr. Silas Flood in 6-8 weeks and As needed   Please contact office for sooner follow up if symptoms do not improve or worsen or seek emergency care

## 2021-07-31 NOTE — Progress Notes (Signed)
@Patient  ID: Heidi Franklin, female    DOB: [birth year], 75 y.o.   MRN: [id number]  Chief Complaint  Patient presents with   Follow-up    Referring provider: [name], MD  HPI: 75 yo female never smoker followed for chronic cough, bronchitis , and mild bronchiectasis  Medical history significant for diabetes  TEST/EVENTS :  HRCT Chest 04/2017 Neg for ILD, Mild bronchiectasis   07/31/2021 Follow up : Cough , Bronchiectasis  Patient returns for a 30-month follow-up.  Patient has underlying chronic bronchitis and mild bronchiectasis noted on CT scan.  Last visit she was tried on Breo.  Patient says it did not help at all she has not seen any significant change in her cough with this.  She does complain over the last 2 3 weeks her cough has been worse.  She complains of a minimally productive cough, sinus drainage intermittent hoarseness and wheezing.  If she does cough up something it is only clear.  She denies any fever chest pain orthopnea or PND. Last visit patient was suspected to have a component of reflux.  She has been recommended continue on Protonix daily.    Allergies  Allergen Reactions   Metformin And Related Diarrhea   Penicillins Rash    Immunization History  Administered Date(s) Administered   Fluad Quad(high Dose 65+) 06/25/2019, 07/14/2020, 07/17/2021   Influenza Split 08/20/2011, 07/30/2012, 06/28/2014   Influenza Whole 07/28/2009, 07/06/2010   Influenza, High Dose Seasonal PF 06/19/2018   Influenza, Seasonal, Injecte, Preservative Fre 06/25/2019   Influenza,inj,Quad PF,6+ Mos 06/23/2013, 07/05/2015, 06/20/2016, 06/23/2017   Influenza-Unspecified 06/28/2014   Moderna Sars-Covid-2 Vaccination 11/27/2019, 12/27/2019, 09/12/2020   Pneumococcal Conjugate-13 06/15/2015   Pneumococcal Polysaccharide-23 10/02/2011   Td 11/23/2013    Past Medical History:  Diagnosis Date   ALLERGIC RHINITIS    Allergy    Anxiety state, unspecified    Arthritis    Asthma     BRONCHITIS, CHRONIC    Cataract    b/l   Chronic low back pain 10/09/2016   DEPRESSION    DIABETES MELLITUS, TYPE II    GERD    HYPERLIPIDEMIA    HYPERTENSION    Pneumonia    Psoriasis     Tobacco History: Social History   Tobacco Use  Smoking Status Never  Smokeless Tobacco Never   Counseling given: Not Answered   Outpatient Medications Prior to Visit  Medication Sig Dispense Refill   acetaminophen (TYLENOL) 325 MG tablet Take 650 mg by mouth every 6 (six) hours as needed.     albuterol (VENTOLIN HFA) 108 (90 Base) MCG/ACT inhaler INHALE 1-2 PUFFS INTO THE LUNGS EVERY 6 (SIX) HOURS AS NEEDED FOR WHEEZING OR SHORTNESS OF BREATH. 8.5 each 3   ALPRAZolam (XANAX) 0.5 MG tablet Take 1 tablet (0.5 mg total) by mouth 2 (two) times daily as needed for anxiety. 60 tablet 2   amLODipine (NORVASC) 10 MG tablet TAKE 1 TABLET BY MOUTH EVERY DAY 90 tablet 1   azithromycin (ZITHROMAX Z-PAK) 250 MG tablet 2 tabs a day the first day, then 1 tab a day x 4 days 6 tablet 0   betamethasone dipropionate (DIPROLENE) 0.05 % ointment Apply to affected areas twice a day and then as needed flare/itch     Cholecalciferol (VITAMIN D3) 50 MCG (2000 UT) capsule Take 2,000 Units by mouth daily.     fluticasone (CUTIVATE) 0.05 % cream APPLY TO AFFECTED AREA TWICE A DAY 1 WEEK ON 1 WEEK OFF PRN FLARE  fluticasone (FLONASE) 50 MCG/ACT nasal spray SPRAY 2 SPRAYS INTO EACH NOSTRIL EVERY DAY 48 mL 2   fluticasone furoate-vilanterol (BREO ELLIPTA) 200-25 MCG/INH AEPB Inhale 1 puff into the lungs daily. 180 each 3   glimepiride (AMARYL) 1 MG tablet TAKE 1 TABLET BY MOUTH EVERY DAY WITH BREAKFAST 90 tablet 1   glucose blood (ONE TOUCH ULTRA TEST) test strip 1 each by Other route daily. Use 1 strips to check blood sugar twice a day Ex E11.9 100 each 3   HUMIRA PEN-PS/UV/ADOL HS START 40 MG/0.8ML PNKT Every 2 weeks     hydrochlorothiazide (HYDRODIURIL) 12.5 MG tablet      HYDROcodone bit-homatropine (HYCODAN)  5-1.5 MG/5ML syrup Take 5 mLs by mouth at bedtime as needed for cough. 90 mL 0   ketoconazole (NIZORAL) 2 % cream      Lancets (ONETOUCH ULTRASOFT) lancets PRN 100 each 3   losartan-hydrochlorothiazide (HYZAAR) 100-12.5 MG tablet TAKE 1 TABLET BY MOUTH EVERY DAY 90 tablet 1   lovastatin (MEVACOR) 20 MG tablet TAKE 1 TABLET BY MOUTH EVERYDAY AT BEDTIME 90 tablet 0   meloxicam (MOBIC) 15 MG tablet Take 1 tablet (15 mg total) by mouth daily. 30 tablet 1   mometasone (ELOCON) 0.1 % ointment      montelukast (SINGULAIR) 10 MG tablet TAKE 1 TABLET BY MOUTH EVERYDAY AT BEDTIME 90 tablet 1   Multiple Vitamin (MULTI-VITAMIN) tablet      Multiple Vitamins-Minerals (CENTRUM SILVER PO) Take 1 tablet by mouth every morning.     pantoprazole (PROTONIX) 40 MG tablet Take 1 tablet (40 mg total) by mouth 2 (two) times daily before a meal. 60 tablet 3   Potassium Chloride ER 20 MEQ TBCR TAKE 1 TABLET BY MOUTH EVERY DAY 90 tablet 1   predniSONE (DELTASONE) 10 MG tablet 3 tabs x 3 days, 2 tabs x 3 days, 1 tab x 3 days 18 tablet 0   tiZANidine (ZANAFLEX) 2 MG tablet Take 1 tablet (2 mg total) by mouth every 8 (eight) hours as needed for muscle spasms. 30 tablet 0   valACYclovir (VALTREX) 500 MG tablet Take 500 mg by mouth 2 (two) times daily.  0   benzonatate (TESSALON) 100 MG capsule Take 1 capsule (100 mg total) by mouth 2 (two) times daily as needed for cough. (Patient not taking: Reported on 07/31/2021) 20 capsule 0   No facility-administered medications prior to visit.     Review of Systems:   Constitutional:   No  weight loss, night sweats,  Fevers, chills, fatigue, or  lassitude.  HEENT:   No headaches,  Difficulty swallowing,  Tooth/dental problems, or  Sore throat,                No sneezing, itching, ear ache,  +nasal congestion, post nasal drip,   CV:  No chest pain,  Orthopnea, PND, swelling in lower extremities, anasarca, dizziness, palpitations, syncope.   GI  No heartburn, indigestion,  abdominal pain, nausea, vomiting, diarrhea, change in bowel habits, loss of appetite, bloody stools.   Resp:   No chest wall deformity  Skin: no rash or lesions.  GU: no dysuria, change in color of urine, no urgency or frequency.  No flank pain, no hematuria   MS:  No joint pain or swelling.  No decreased range of motion.  No back pain.    Physical Exam  BP (!) 120/56 (BP Location: Left Arm, Patient Position: Sitting, Cuff Size: Normal)   Pulse (!) 101   Temp  98.7 F (37.1 C) (Oral)   Ht 5' (1.524 m)   Wt 161 lb 6.4 oz (73.2 kg)   SpO2 98%   BMI 31.52 kg/m   GEN: A/Ox3; pleasant , NAD, well nourished    HEENT:  [place]/AT,   NOSE-clear, THROAT-clear, no lesions, no postnasal drip or exudate noted.   NECK:  Supple w/ fair ROM; no JVD; normal carotid impulses w/o bruits; no thyromegaly or nodules palpated; no lymphadenopathy.    RESP  Clear  P & A; w/o, wheezes/ rales/ or rhonchi. no accessory muscle use, no dullness to percussion  CARD:  RRR, no m/r/g, no peripheral edema, pulses intact, no cyanosis or clubbing.  GI:   Soft & nt; nml bowel sounds; no organomegaly or masses detected.   Musco: Warm bil, no deformities or joint swelling noted.   Neuro: alert, no focal deficits noted.    Skin: Warm, no lesions or rashes    Lab Results:    BNP No results found for: BNP  ProBNP No results found for: PROBNP  Imaging: No results found.    PFT Results Latest Ref Rng & Units 12/24/2013  FVC-Pre L 2.14  FVC-Predicted Pre % 100  FVC-Post L 2.11  FVC-Predicted Post % 99  Pre FEV1/FVC % % 81  Post FEV1/FCV % % 80  FEV1-Pre L 1.72  FEV1-Predicted Pre % 104  FEV1-Post L 1.69    Lab Results  Component Value Date   NITRICOXIDE 15 04/25/2017        Assessment & Plan:   No problem-specific Assessment & Plan notes found for this encounter.     Rexene Edison, NP 07/31/2021

## 2021-08-03 DIAGNOSIS — L408 Other psoriasis: Secondary | ICD-10-CM | POA: Diagnosis not present

## 2021-08-06 NOTE — Assessment & Plan Note (Signed)
Flare   Plan  Patient Instructions  Chest xray today.  Prednisone 20mg  daily for days.  Begin Robitussin DM 2 tsp every 4hrs as needed for cough  Tessalon Three times a day  As needed  for cough .  Begin Flutter valve Three times a day  for cough  Begin Zyrtec 10mg  At bedtime  .  Albuterol inhaler As needed   May stop BREO .  Follow up with Dr. Silas Flood in 6-8 weeks and As needed   Please contact office for sooner follow up if symptoms do not improve or worsen or seek emergency care

## 2021-08-06 NOTE — Assessment & Plan Note (Signed)
Chronic cough - no significant improvement on BREO  D/c BREO  Cont trigger prevention  Add cough suppression regimen  Short course of steroids x 5 days   Plan  . Patient Instructions  Chest xray today.  Prednisone 20mg  daily for days.  Begin Robitussin DM 2 tsp every 4hrs as needed for cough  Tessalon Three times a day  As needed  for cough .  Begin Flutter valve Three times a day  for cough  Begin Zyrtec 10mg  At bedtime  .  Albuterol inhaler As needed   May stop BREO .  Follow up with Dr. Silas Flood in 6-8 weeks and As needed   Please contact office for sooner follow up if symptoms do not improve or worsen or seek emergency care

## 2021-08-06 NOTE — Assessment & Plan Note (Addendum)
Flare  Add Flutter valve  Check CXR today   Plan  Patient Instructions  Chest xray today.  Prednisone 20mg  daily for days.  Begin Robitussin DM 2 tsp every 4hrs as needed for cough  Tessalon Three times a day  As needed  for cough .  Begin Flutter valve Three times a day  for cough  Begin Zyrtec 10mg  At bedtime  .  Albuterol inhaler As needed   May stop BREO .  Follow up with Dr. Silas Flood in 6-8 weeks and As needed   Please contact office for sooner follow up if symptoms do not improve or worsen or seek emergency care

## 2021-08-09 ENCOUNTER — Other Ambulatory Visit: Payer: Self-pay | Admitting: Family Medicine

## 2021-08-10 NOTE — Telephone Encounter (Signed)
Requesting: alprazolam Contract:  UDS: Last Visit: 01/18/21 Next Visit: 09/18/21 Last Refill: 01/18/21  Please Advise

## 2021-08-21 ENCOUNTER — Encounter: Payer: Self-pay | Admitting: Family Medicine

## 2021-08-22 DIAGNOSIS — L400 Psoriasis vulgaris: Secondary | ICD-10-CM | POA: Diagnosis not present

## 2021-08-31 ENCOUNTER — Other Ambulatory Visit: Payer: Self-pay | Admitting: Family Medicine

## 2021-09-05 DIAGNOSIS — L400 Psoriasis vulgaris: Secondary | ICD-10-CM | POA: Diagnosis not present

## 2021-09-07 ENCOUNTER — Other Ambulatory Visit: Payer: Self-pay

## 2021-09-07 ENCOUNTER — Ambulatory Visit (INDEPENDENT_AMBULATORY_CARE_PROVIDER_SITE_OTHER): Payer: Medicare Other | Admitting: Family

## 2021-09-07 ENCOUNTER — Ambulatory Visit: Payer: Medicare Other | Attending: Internal Medicine

## 2021-09-07 VITALS — BP 131/62 | HR 99 | Temp 98.8°F | Resp 16 | Ht 60.0 in | Wt 163.0 lb

## 2021-09-07 DIAGNOSIS — L80 Vitiligo: Secondary | ICD-10-CM

## 2021-09-07 DIAGNOSIS — Z23 Encounter for immunization: Secondary | ICD-10-CM

## 2021-09-07 NOTE — Progress Notes (Signed)
   Covid-19 Vaccination Clinic  Name:  ELIZETH WEINRICH    MRN: [id number] DOB: [birth year]  09/07/2021  Ms. Fabre was observed post Covid-19 immunization for 15 minutes without incident. She was provided with Vaccine Information Sheet and instruction to access the V-Safe system.   Ms. Zagal was instructed to call 911 with any severe reactions post vaccine: Difficulty breathing  Swelling of face and throat  A fast heartbeat  A bad rash all over body  Dizziness and weakness   Immunizations Administered     Name Date Dose VIS Date Route   Moderna Covid-19 vaccine Bivalent Booster 09/07/2021  2:39 PM 0.5 mL 06/09/2021 Intramuscular   Manufacturer: Moderna   Lot: [id number]   [place]: 57322-567-20

## 2021-09-07 NOTE — Assessment & Plan Note (Signed)
One area of hypopigmentation on the right posterior heel near the achilles.  She denies any other areas of hypopigmentation. Advised her that there is no cure for this and it is often autoimmune in etiology. She was disappointed that there is not a treatment for it. In the meantime, she does have a dermatologist and plans to discuss this at her next visit.

## 2021-09-07 NOTE — Progress Notes (Signed)
Subjective:   By signing my name below, I, Lyric Barr-McArthur, attest that this documentation has been prepared under the direction and in the presence of Debbrah Alar, NP, 09/07/2021   Patient ID: Heidi Franklin, female    DOB: 1946/08/09, 75 y.o.   MRN: [id number]  Chief Complaint  Patient presents with   Skin Problem    Patient complains of discoloration of skin on right foot, above heel.    HPI Patient is in today for an office visit.  Right foot: She complains of pain in her right foot that she has seen the podiatrist for. She receives cortisone shots to help with the pain but she is in today because she has some very light spots of skin on the back of her ankle. This hypopigmented area does not itch or bother her except for its cosmetic appearance.    Health Maintenance Due  Topic Date Due   Zoster Vaccines- Shingrix (1 of 2) Never done   HEMOGLOBIN A1C  08/22/2021   FOOT EXAM  09/07/2021    Past Medical History:  Diagnosis Date   ALLERGIC RHINITIS    Allergy    Anxiety state, unspecified    Arthritis    Asthma    BRONCHITIS, CHRONIC    Cataract    b/l   Chronic low back pain 10/09/2016   DEPRESSION    DIABETES MELLITUS, TYPE II    GERD    HYPERLIPIDEMIA    HYPERTENSION    Pneumonia    Psoriasis     Past Surgical History:  Procedure Laterality Date   ABDOMINAL HYSTERECTOMY  1980   CARPAL TUNNEL RELEASE Right    TRIGGER FINGER RELEASE Left    Index finger    Family History  Problem Relation Age of Onset   Stroke Mother    Angina Mother    Hyperlipidemia Mother    Hypertension Mother    Heart disease Mother    Depression Mother    Diabetes Father    Hyperlipidemia Other        Parent   Hypertension Other        Parent   Diabetes Sister        x1   CAD Sister    COPD Sister        smoker   Hyperlipidemia Sister    Hypertension Sister    Diabetes Brother        x 2   Hyperlipidemia Brother    Hypertension Brother    Diabetes  Brother    Hyperlipidemia Brother    Hypertension Brother    CAD Sister    Arthritis Sister    Hyperlipidemia Sister    Hypertension Sister    Colon cancer Neg Hx    Esophageal cancer Neg Hx    Rectal cancer Neg Hx    Stomach cancer Neg Hx    Pancreatic cancer Neg Hx     Social History   Socioeconomic History   Marital status: Married    Spouse name: Not on file   Number of children: 0   Years of education: 98yr colge   Highest education level: Not on file  Occupational History   Occupation: Retired   Tobacco Use   Smoking status: Never   Smokeless tobacco: Never  Vaping Use   Vaping Use: Never used  Substance and Sexual Activity   Alcohol use: No    Alcohol/week: 0.0 standard drinks   Drug use: No   Sexual activity: Not  Currently  Other Topics Concern   Not on file  Social History Narrative   Married, lives with spouse-retired from [employer] insurance   Not employed    Drinks coffee occasional, Consumes 1 soda a day    No dietary restrictions   Social Determinants of Radio broadcast assistant Strain: Low Risk    Difficulty of Paying Living Expenses: Not very hard  Food Insecurity: No Food Insecurity   Worried About Charity fundraiser in the Last Year: Never true   Arboriculturist in the Last Year: Never true  Transportation Needs: No Transportation Needs   Lack of Transportation (Medical): No   Lack of Transportation (Non-Medical): No  Physical Activity: Inactive   Days of Exercise per Week: 0 days   Minutes of Exercise per Session: 0 min  Stress: Stress Concern Present   Feeling of Stress : To some extent  Social Connections: Engineer, building services of Communication with Friends and Family: More than three times a week   Frequency of Social Gatherings with Friends and Family: More than three times a week   Attends Religious Services: More than 4 times per year   Active Member of [employer] or Organizations: Yes   Attends Archivist Meetings: 1  to 4 times per year   Marital Status: Married  Human resources officer Violence: Not At Risk   Fear of Current or Ex-Partner: No   Emotionally Abused: No   Physically Abused: No   Sexually Abused: No    Outpatient Medications Prior to Visit  Medication Sig Dispense Refill   acetaminophen (TYLENOL) 325 MG tablet Take 650 mg by mouth every 6 (six) hours as needed.     albuterol (VENTOLIN HFA) 108 (90 Base) MCG/ACT inhaler INHALE 1-2 PUFFS INTO THE LUNGS EVERY 6 (SIX) HOURS AS NEEDED FOR WHEEZING OR SHORTNESS OF BREATH. 8.5 each 3   ALPRAZolam (XANAX) 0.5 MG tablet TAKE 1 TABLET BY MOUTH 2 TIMES DAILY AS NEEDED FOR ANXIETY. 60 tablet 1   amLODipine (NORVASC) 10 MG tablet TAKE 1 TABLET BY MOUTH EVERY DAY 90 tablet 1   betamethasone dipropionate (DIPROLENE) 0.05 % ointment Apply to affected areas twice a day and then as needed flare/itch     Cholecalciferol (VITAMIN D3) 50 MCG (2000 UT) capsule Take 2,000 Units by mouth daily.     fluticasone (CUTIVATE) 0.05 % cream APPLY TO AFFECTED AREA TWICE A DAY 1 WEEK ON 1 WEEK OFF PRN FLARE     fluticasone (FLONASE) 50 MCG/ACT nasal spray SPRAY 2 SPRAYS INTO EACH NOSTRIL EVERY DAY 48 mL 2   fluticasone furoate-vilanterol (BREO ELLIPTA) 200-25 MCG/INH AEPB Inhale 1 puff into the lungs daily. 180 each 3   glimepiride (AMARYL) 1 MG tablet TAKE 1 TABLET BY MOUTH EVERY DAY WITH BREAKFAST 90 tablet 1   glucose blood (ONE TOUCH ULTRA TEST) test strip 1 each by Other route daily. Use 1 strips to check blood sugar twice a day Ex E11.9 100 each 3   HUMIRA PEN-PS/UV/ADOL HS START 40 MG/0.8ML PNKT Every 2 weeks     hydrochlorothiazide (HYDRODIURIL) 12.5 MG tablet      HYDROcodone bit-homatropine (HYCODAN) 5-1.5 MG/5ML syrup Take 5 mLs by mouth at bedtime as needed for cough. 90 mL 0   ketoconazole (NIZORAL) 2 % cream      Lancets (ONETOUCH ULTRASOFT) lancets PRN 100 each 3   losartan-hydrochlorothiazide (HYZAAR) 100-12.5 MG tablet TAKE 1 TABLET BY MOUTH EVERY DAY 90 tablet  1  lovastatin (MEVACOR) 20 MG tablet TAKE 1 TABLET BY MOUTH EVERYDAY AT BEDTIME 90 tablet 0   meloxicam (MOBIC) 15 MG tablet Take 1 tablet (15 mg total) by mouth daily. 30 tablet 1   mometasone (ELOCON) 0.1 % ointment      montelukast (SINGULAIR) 10 MG tablet TAKE 1 TABLET BY MOUTH EVERYDAY AT BEDTIME 90 tablet 1   Multiple Vitamin (MULTI-VITAMIN) tablet      Multiple Vitamins-Minerals (CENTRUM SILVER PO) Take 1 tablet by mouth every morning.     pantoprazole (PROTONIX) 40 MG tablet Take 1 tablet (40 mg total) by mouth 2 (two) times daily before a meal. 60 tablet 3   Potassium Chloride ER 20 MEQ TBCR TAKE 1 TABLET BY MOUTH EVERY DAY 90 tablet 1   tiZANidine (ZANAFLEX) 2 MG tablet Take 1 tablet (2 mg total) by mouth every 8 (eight) hours as needed for muscle spasms. 30 tablet 0   valACYclovir (VALTREX) 500 MG tablet Take 500 mg by mouth 2 (two) times daily.  0   azithromycin (ZITHROMAX Z-PAK) 250 MG tablet 2 tabs a day the first day, then 1 tab a day x 4 days 6 tablet 0   benzonatate (TESSALON) 100 MG capsule Take 1 capsule (100 mg total) by mouth 2 (two) times daily as needed for cough. (Patient not taking: Reported on 07/31/2021) 20 capsule 0   predniSONE (DELTASONE) 10 MG tablet 3 tabs x 3 days, 2 tabs x 3 days, 1 tab x 3 days 18 tablet 0   predniSONE (DELTASONE) 20 MG tablet Take 1 tablet (20 mg total) by mouth daily with breakfast. 5 tablet 0   No facility-administered medications prior to visit.    Allergies  Allergen Reactions   Metformin And Related Diarrhea   Penicillins Rash    Review of Systems  Musculoskeletal:        (+) pain in feet managed by cortisone injections  Skin:        (+) white spot of skin on back of heel      Objective:    Physical Exam Constitutional:      General: She is not in acute distress.    Appearance: Normal appearance. She is not ill-appearing.  HENT:     Head: Normocephalic and atraumatic.     Right Ear: External ear normal.     Left Ear:  External ear normal.  Eyes:     Extraocular Movements: Extraocular movements intact.     Pupils: Pupils are equal, round, and reactive to light.  Pulmonary:     Effort: Pulmonary effort is normal.     Breath sounds: No rales.  Skin:    General: Skin is warm and dry.     Comments: (+) hypopigmentation overlying right achilles   Neurological:     Mental Status: She is alert and oriented to person, place, and time.  Psychiatric:        Behavior: Behavior normal.        Judgment: Judgment normal.    BP 131/62 (BP Location: Right Arm, Patient Position: Sitting, Cuff Size: Small)   Pulse 99   Temp 98.8 F (37.1 C) (Oral)   Resp 16   Ht 5' (1.524 m)   Wt 163 lb (73.9 kg)   SpO2 100%   BMI 31.83 kg/m  Wt Readings from Last 3 Encounters:  09/07/21 163 lb (73.9 kg)  07/31/21 161 lb 6.4 oz (73.2 kg)  06/27/21 159 lb (72.1 kg)  Assessment & Plan:   Problem List Items Addressed This Visit       Unprioritized   Vitiligo - Primary    One area of hypopigmentation on the right posterior heel near the achilles.  She denies any other areas of hypopigmentation. Advised her that there is no cure for this and it is often autoimmune in etiology. She was disappointed that there is not a treatment for it. In the meantime, she does have a dermatologist and plans to discuss this at her next visit.       No orders of the defined types were placed in this encounter.   I, Debbrah Alar, NP, personally preformed the services described in this documentation.  All medical record entries made by the scribe were at my direction and in my presence.  I have reviewed the chart and discharge instructions (if applicable) and agree that the record reflects my personal performance and is accurate and complete. 09/07/2021  I,Lyric Barr-McArthur,acting as a Education administrator for Nance Pear, NP.,have documented all relevant documentation on the behalf of Nance Pear, NP,as directed by  Nance Pear, NP while in the presence of Nance Pear, NP.  Nance Pear, NP

## 2021-09-12 ENCOUNTER — Telehealth: Payer: Self-pay | Admitting: Family Medicine

## 2021-09-12 NOTE — Telephone Encounter (Signed)
Pt states that she not taken steroids in 6 weeks ago but she did take hycodon syrup last night for her persist cough

## 2021-09-12 NOTE — Telephone Encounter (Signed)
Pt stated her blood sugar was 491 and after she rechecked it 416. Transferred to Triage.

## 2021-09-12 NOTE — Telephone Encounter (Signed)
Patient is currently on glimepiride 1 mg daily.  Last A1c 6.7 in April 2022. Call patient: - is patient taking the steroids?? - Check CBGs 4 times a day and call with readings tomorrow. -If persistently elevated in the 400 or if she has nausea, vomiting: Call  or go to the ER

## 2021-09-12 NOTE — Telephone Encounter (Signed)
noted 

## 2021-09-12 NOTE — Telephone Encounter (Signed)
Spoke with triage nurse and she states that pt had blood sugar this morning that were elevated. When she took them this morning it was 491 then 416, and then 142 15 mins before she called in. Pt hadn't ate yet.

## 2021-09-12 NOTE — Telephone Encounter (Signed)
Lvm to call back

## 2021-09-12 NOTE — Telephone Encounter (Signed)
Nurse Assessment Nurse: D'Heur Lucia Gaskins, RN, Adrienne Date/Time (Eastern Time): 09/12/2021 10:41:58 AM Confirm and document reason for call. If symptomatic, describe symptoms. ---Caller states her blood sugar is 416. It was 491 when she woke up. When she just now took it again (15 minutes ago), it was 142. She has not yet eaten or taken any of her medicines. (She takes Glimepiride). Does the patient have any new or worsening symptoms? ---Yes Will a triage be completed? ---Yes Related visit to physician within the last 2 weeks? ---No Does the PT have any chronic conditions? (i.e. diabetes, asthma, this includes High risk factors for pregnancy, etc.) ---Yes List chronic conditions. ---diabetes, HTN, allergies Is this a behavioral health or substance abuse call? ---No Guidelines Guideline Title Affirmed Question Affirmed Notes Nurse Date/Time (Eastern Time) Diabetes - High Blood Sugar [1] Blood glucose > 300 mg/dL (16.7 mmol/L) AND [2] two or more times in a row D'Heur Oxford, RN, Adrienne 09/12/2021 10:45:56 AM Disp. Time Eilene Ghazi Time) Disposition Final User 09/12/2021 10:50:36 AM Call PCP Now Yes D'Heur Lucia Gaskins, RN, Adrienne PLEASE NOTE: All timestamps contained within this report are represented as [place] Standard Time. CONFIDENTIALTY NOTICE: This fax transmission is intended only for the addressee. It contains information that is legally privileged, confidential or otherwise protected from use or disclosure. If you are not the intended recipient, you are strictly prohibited from reviewing, disclosing, copying using or disseminating any of this information or taking any action in reliance on or regarding this information. If you have received this fax in error, please notify us immediately by telephone so that we can arrange for its return to [place]. Phone: [phone], Toll-Free: [phone], Fax: [phone] Page: 2 of 2 Call Id: [id number] [place] Disagree/Comply  Comply Caller Understands Yes PreDisposition Call Doctor Care Advice Given Per Guideline CALL PCP NOW: * I'll page the on-call provider now. If you haven't heard from the provider (or me) within 30 minutes, call again. CALL BACK IF: * Vomiting occurs * Rapid breathing occurs * You become worse CARE ADVICE given per Diabetes - High Blood Sugar (Adult) guideline. Comments User: Vincente Liberty, D'Heur Lucia Gaskins, RN Date/Time Eilene Ghazi Time): 09/12/2021 10:47:05 AM Caller has a chronic cough. User: Vincente Liberty, D'Heur Lucia Gaskins, RN Date/Time Eilene Ghazi Time): 09/12/2021 10:48:34 AM Temp is 98.5 oral. User: Vincente Liberty, D'Heur Lucia Gaskins, RN Date/Time Eilene Ghazi Time): 09/12/2021 10:55:55 AM Called office as per directives. They will send message to PCP and will call patient back. Patient advised of this, understanding voiced

## 2021-09-13 NOTE — Telephone Encounter (Signed)
Pt states that she is feeling better and her sugars were in the 140's.

## 2021-09-17 DIAGNOSIS — L400 Psoriasis vulgaris: Secondary | ICD-10-CM | POA: Diagnosis not present

## 2021-09-18 ENCOUNTER — Telehealth: Payer: Self-pay

## 2021-09-18 ENCOUNTER — Ambulatory Visit: Payer: Medicare Other | Admitting: Family Medicine

## 2021-09-18 NOTE — Telephone Encounter (Signed)
Lvm to see if they would like to reschedule for Monday

## 2021-09-18 NOTE — Telephone Encounter (Signed)
caller had an appt today at 1040 but message was just left advising that Dr. Charlett Blake would not be in today caller asking will she still come in and see a different provider or will the appt be rescheduled  Telephone: [phone]

## 2021-09-19 NOTE — Telephone Encounter (Signed)
Patient has appt on Monday 11/28 already.

## 2021-09-22 ENCOUNTER — Other Ambulatory Visit: Payer: Self-pay

## 2021-09-22 ENCOUNTER — Emergency Department (INDEPENDENT_AMBULATORY_CARE_PROVIDER_SITE_OTHER)
Admission: EM | Admit: 2021-09-22 | Discharge: 2021-09-22 | Disposition: A | Discharge status: Home / Self Care | Payer: Medicare Other | Source: Home / Self Care | Attending: Family Medicine | Admitting: Family Medicine

## 2021-09-22 ENCOUNTER — Encounter: Payer: Self-pay | Admitting: Emergency Medicine

## 2021-09-22 ENCOUNTER — Emergency Department (INDEPENDENT_AMBULATORY_CARE_PROVIDER_SITE_OTHER): Payer: Medicare Other

## 2021-09-22 ENCOUNTER — Other Ambulatory Visit: Payer: Self-pay | Admitting: Internal Medicine

## 2021-09-22 DIAGNOSIS — M25562 Pain in left knee: Secondary | ICD-10-CM

## 2021-09-22 DIAGNOSIS — M25561 Pain in right knee: Secondary | ICD-10-CM | POA: Diagnosis not present

## 2021-09-22 DIAGNOSIS — W19XXXA Unspecified fall, initial encounter: Secondary | ICD-10-CM

## 2021-09-22 DIAGNOSIS — S8992XA Unspecified injury of left lower leg, initial encounter: Secondary | ICD-10-CM | POA: Diagnosis not present

## 2021-09-22 DIAGNOSIS — M1711 Unilateral primary osteoarthritis, right knee: Secondary | ICD-10-CM | POA: Diagnosis not present

## 2021-09-22 DIAGNOSIS — M1712 Unilateral primary osteoarthritis, left knee: Secondary | ICD-10-CM | POA: Diagnosis not present

## 2021-09-22 DIAGNOSIS — M170 Bilateral primary osteoarthritis of knee: Secondary | ICD-10-CM

## 2021-09-22 MED ORDER — PREDNISONE 20 MG PO TABS: 20.0000 mg | ORAL_TABLET | Freq: Two times a day (BID) | ORAL | 0 refills | Status: DC

## 2021-09-22 NOTE — Discharge Instructions (Signed)
Call Dr Lynann Bologna if you fail to improve  Limit walking for couple days Take the prednisone as directed Use ice on the painful knees

## 2021-09-22 NOTE — ED Provider Notes (Signed)
[facility]    CSN: [id number] Arrival date & time: 09/22/21  1210      History   Chief Complaint Chief Complaint  Patient presents with   Fall    HPI Heidi Franklin is a 75 y.o. female.   HPI  Patient had a fall at her home last week.  She states she "fell hard".  She tripped over a box and fell towards her left side.  Has some pain limitation in her left shoulder.  Has pain in both knees.  Right knee greater than left.  Has to walk real slow.  Has a click in her knee.  No buckling locking or instability.  She states that the right knee was a "bad knee" before she fell.  Does not remember last time she may have had x-rays.  Does think she has arthritis in her knees.  Did not follow-up because she was dizzy or off balance  Past Medical History:  Diagnosis Date   ALLERGIC RHINITIS    Allergy    Anxiety state, unspecified    Arthritis    Asthma    BRONCHITIS, CHRONIC    Cataract    b/l   Chronic low back pain 10/09/2016   DEPRESSION    DIABETES MELLITUS, TYPE II    GERD    HYPERLIPIDEMIA    HYPERTENSION    Pneumonia    Psoriasis     Patient Active Problem List   Diagnosis Date Noted   Type 2 diabetes mellitus with hyperglycemia ([place]) 09/24/2021   Acute bilateral knee pain 09/24/2021   Vitiligo 09/07/2021   Pedal edema 03/27/2020   Toe ulcer ([place]) 03/10/2020   Body mass index (BMI) 31.0-31.9, adult 11/08/2019   Disc displacement, lumbar 11/08/2019   Strain of lumbar paraspinal muscle 11/08/2019   History of chicken pox 04/11/2019   Vitamin D deficiency 04/05/2019   Lumbar radiculopathy 03/31/2019   Neck pain 07/06/2018   Trapezius muscle strain, right, initial encounter 06/23/2018   Edema 06/23/2018   Gout 04/08/2018   Herpes simplex 02/10/2018   ETD (eustachian tube dysfunction) 02/10/2018   Peroneal tendinitis, right 01/06/2018   Psoriasis 11/14/2017   Acute bursitis of right shoulder 09/29/2017   Bilateral hip pain 07/10/2017    Bronchiectasis without complication ([place]) 16/96/7893   Irritable larynx syndrome 04/25/2017   Reflux laryngitis 04/23/2017   Benign lipomatous neoplasm of skin and subcutaneous tissue of right leg 02/25/2017   Anemia 02/05/2017   Fatigue 01/23/2017   Cough 01/02/2017   Multiple pulmonary nodules 11/10/2016   Chronic low back pain 10/09/2016   Degenerative arthritis of right knee 06/20/2016   Degenerative arthritis of left knee 04/10/2016   Bronchitis 12/15/2015   Peroneal tendinitis of left lower extremity 09/05/2015   Recurrent falls 08/08/2015   Bursitis of left shoulder 07/13/2015   Morbid obesity due to excess calories ([place]) 04/16/2015   Nonspecific abnormal electrocardiogram (ECG) (EKG) 05/11/2011   Hyperlipidemia associated with type 2 diabetes mellitus ([place]) 03/02/2010   Depression with anxiety 03/02/2010   Essential hypertension 03/02/2010   Chronic rhinitis 03/02/2010   Upper airway cough syndrome 03/02/2010   GERD 03/02/2010    Past Surgical History:  Procedure Laterality Date   ABDOMINAL HYSTERECTOMY  1980   CARPAL TUNNEL RELEASE Right    TRIGGER FINGER RELEASE Left    Index finger    OB History   No obstetric history on file.      Home Medications    Prior to Admission medications  Medication Sig Start Date End Date Taking? Authorizing Provider  acetaminophen (TYLENOL) 325 MG tablet Take 650 mg by mouth every 6 (six) hours as needed.   Yes [provider]  albuterol (VENTOLIN HFA) 108 (90 Base) MCG/ACT inhaler INHALE 1-2 PUFFS INTO THE LUNGS EVERY 6 (SIX) HOURS AS NEEDED FOR WHEEZING OR SHORTNESS OF BREATH. 06/21/21  Yes [name], MD  ALPRAZolam (XANAX) 0.5 MG tablet TAKE 1 TABLET BY MOUTH 2 TIMES DAILY AS NEEDED FOR ANXIETY. 08/10/21  Yes [name], MD  amLODipine (NORVASC) 10 MG tablet TAKE 1 TABLET BY MOUTH EVERY DAY 07/05/21  Yes [name], MD  betamethasone dipropionate (DIPROLENE) 0.05 % ointment Apply to affected areas twice  a day and then as needed flare/itch 01/13/20  Yes [provider]  Cholecalciferol (VITAMIN D3) 50 MCG (2000 UT) capsule Take 2,000 Units by mouth daily.   Yes [provider]  fluticasone (CUTIVATE) 0.05 % cream APPLY TO AFFECTED AREA TWICE A DAY 1 WEEK ON 1 WEEK OFF PRN FLARE 01/29/20  Yes [provider]  fluticasone (FLONASE) 50 MCG/ACT nasal spray SPRAY 2 SPRAYS INTO EACH NOSTRIL EVERY DAY 01/02/21  Yes [name], MD  glimepiride (AMARYL) 1 MG tablet TAKE 1 TABLET BY MOUTH EVERY DAY WITH BREAKFAST 04/12/21  Yes [name], MD  glucose blood (ONE TOUCH ULTRA TEST) test strip 1 each by Other route daily. Use 1 strips to check blood sugar twice a day Ex E11.9 05/26/19  Yes [name], MD  HUMIRA PEN-PS/UV/ADOL HS START 40 MG/0.8ML PNKT Every 2 weeks 01/27/20  Yes [provider]  ketoconazole (NIZORAL) 2 % cream    Yes [provider]  Lancets (ONETOUCH ULTRASOFT) lancets PRN 05/26/19  Yes [name], MD  losartan-hydrochlorothiazide (HYZAAR) 100-12.5 MG tablet TAKE 1 TABLET BY MOUTH EVERY DAY 07/19/21  Yes [name], MD  lovastatin (MEVACOR) 20 MG tablet TAKE 1 TABLET BY MOUTH EVERYDAY AT BEDTIME 08/31/21  Yes [name], MD  mometasone (ELOCON) 0.1 % ointment  05/16/16  Yes [provider]  montelukast (SINGULAIR) 10 MG tablet TAKE 1 TABLET BY MOUTH EVERYDAY AT BEDTIME 07/19/21  Yes [name], MD  Multiple Vitamins-Minerals (CENTRUM SILVER PO) Take 1 tablet by mouth every morning.   Yes [provider]  Potassium Chloride ER 20 MEQ TBCR TAKE 1 TABLET BY MOUTH EVERY DAY 05/10/21  Yes [name], MD  predniSONE (DELTASONE) 20 MG tablet Take 1 tablet (20 mg total) by mouth 2 (two) times daily with a meal. 09/22/21  Yes Raylene Everts, MD  valACYclovir (VALTREX) 500 MG tablet Take 500 mg by mouth 2 (two) times daily. 07/30/18  Yes [provider]  COVID-19 mRNA bivalent vaccine, Moderna, (MODERNA  COVID-19 BIVAL BOOSTER) 50 MCG/0.5ML injection Inject into the muscle. 09/07/21   Carlyle Basques, MD  fluticasone (FLOVENT HFA) 220 MCG/ACT inhaler Inhale 1 puff into the lungs 2 (two) times daily as needed. 09/24/21   [name], MD  HYDROcodone bit-homatropine (HYCODAN) 5-1.5 MG/5ML syrup Take 5 mLs by mouth at bedtime as needed for cough. 09/24/21   [name], MD  pantoprazole (PROTONIX) 40 MG tablet TAKE 1 TABLET (40 MG TOTAL) BY MOUTH 2 (TWO) TIMES DAILY BEFORE A MEAL. 09/24/21   [name], MD  tiZANidine (ZANAFLEX) 2 MG tablet Take 0.5-2 tablets (1-4 mg total) by mouth every 8 (eight) hours as needed for muscle spasms. 09/24/21   Charlett Blake,  Bonnita Levan, MD    Family History Family History  Problem Relation Age of Onset   Stroke Mother    Angina Mother    Hyperlipidemia Mother    Hypertension Mother    Heart disease Mother    Depression Mother    Diabetes Father    Hyperlipidemia Other        Parent   Hypertension Other        Parent   Diabetes Sister        x1   CAD Sister    COPD Sister        smoker   Hyperlipidemia Sister    Hypertension Sister    Diabetes Brother        x 2   Hyperlipidemia Brother    Hypertension Brother    Diabetes Brother    Hyperlipidemia Brother    Hypertension Brother    CAD Sister    Arthritis Sister    Hyperlipidemia Sister    Hypertension Sister    Colon cancer Neg Hx    Esophageal cancer Neg Hx    Rectal cancer Neg Hx    Stomach cancer Neg Hx    Pancreatic cancer Neg Hx     Social History Social History   Tobacco Use   Smoking status: Never   Smokeless tobacco: Never  Vaping Use   Vaping Use: Never used  Substance Use Topics   Alcohol use: No    Alcohol/week: 0.0 standard drinks   Drug use: No     Allergies   Metformin and related and Penicillins   Review of Systems Review of Systems See HPI  Physical Exam Triage Vital Signs ED Triage Vitals [09/22/21 1403]  Enc Vitals Group     BP 121/67      Pulse Rate 96     Resp 20     Temp 99.2 F (37.3 C)     Temp Source Oral     SpO2 100 %     Weight 158 lb (71.7 kg)     Height 5' (1.524 m)     Head Circumference      Peak Flow      Pain Score 7     Pain Loc      Pain Edu?      Excl. in [place]?    No data found.  Updated Vital Signs BP 121/67 (BP Location: Right Arm)   Pulse 96   Temp 99.2 F (37.3 C) (Oral)   Resp 20   Ht 5' (1.524 m)   Wt 71.7 kg   SpO2 100%   BMI 30.86 kg/m     Physical Exam Constitutional:      General: She is not in acute distress.    Appearance: She is well-developed.  HENT:     Head: Normocephalic and atraumatic.     Mouth/Throat:     Comments: Mask is in place Eyes:     Conjunctiva/sclera: Conjunctivae normal.     Pupils: Pupils are equal, round, and reactive to light.  Cardiovascular:     Rate and Rhythm: Normal rate.  Pulmonary:     Effort: Pulmonary effort is normal. No respiratory distress.  Abdominal:     General: There is no distension.     Palpations: Abdomen is soft.  Musculoskeletal:        General: Normal range of motion.     Cervical back: Normal range of motion.     Comments: No bruising is noted.  Right  knee has mild warmth.  Tenderness anteriorly.  No instability.  Left knee has mild crepitus.  No warmth or effusion.  No instability patient complains of left arm pain.  "Whole arm".  Shoulder elbow and wrist exam is normal with good mobility, no warmth or swelling.  No tenderness to palpation  Skin:    General: Skin is warm and dry.  Neurological:     General: No focal deficit present.     Mental Status: She is alert.     Gait: Gait abnormal.  Psychiatric:        Mood and Affect: Mood normal.        Behavior: Behavior normal.     UC Treatments / Results  Labs (all labs ordered are listed, but only abnormal results are displayed) Labs Reviewed - No data to display  EKG   Radiology No results found. Patient had x-rays of both knees.  Degenerative changes are  identified.  No acute fracture or findings Procedures Procedures (including critical care time)  Medications Ordered in UC Medications - No data to display  Initial Impression / Assessment and Plan / UC Course  I have reviewed the triage vital signs and the nursing notes.  Pertinent labs & imaging results that were available during my care of the patient were reviewed by me and considered in my medical decision making (see chart for details).     Final Clinical Impressions(s) / UC Diagnoses   Final diagnoses:  Acute bilateral knee pain  Primary osteoarthritis of both knees  Fall, initial encounter     Discharge Instructions      Call Dr Lynann Bologna if you fail to improve  Limit walking for couple days Take the prednisone as directed Use ice on the painful knees     ED Prescriptions     Medication Sig Dispense Auth. Provider   predniSONE (DELTASONE) 20 MG tablet Take 1 tablet (20 mg total) by mouth 2 (two) times daily with a meal. 10 tablet Raylene Everts, MD      PDMP not reviewed this encounter.   Raylene Everts, MD 09/26/21 [phone]

## 2021-09-22 NOTE — ED Triage Notes (Signed)
Patient states that she fell on Sunday.  Patient is having right knee pain, bilateral leg pain and left arm pain since the fall.  Patient has been taken Tylenol for the pain. Patient did not hit her head or loose consciousness.

## 2021-09-24 ENCOUNTER — Other Ambulatory Visit: Payer: Self-pay

## 2021-09-24 ENCOUNTER — Encounter: Payer: Self-pay | Admitting: Family Medicine

## 2021-09-24 ENCOUNTER — Telehealth (INDEPENDENT_AMBULATORY_CARE_PROVIDER_SITE_OTHER): Payer: Medicare Other | Admitting: Family Medicine

## 2021-09-24 DIAGNOSIS — L80 Vitiligo: Secondary | ICD-10-CM | POA: Diagnosis not present

## 2021-09-24 DIAGNOSIS — I10 Essential (primary) hypertension: Secondary | ICD-10-CM | POA: Diagnosis not present

## 2021-09-24 DIAGNOSIS — M7552 Bursitis of left shoulder: Secondary | ICD-10-CM

## 2021-09-24 DIAGNOSIS — E1169 Type 2 diabetes mellitus with other specified complication: Secondary | ICD-10-CM | POA: Diagnosis not present

## 2021-09-24 DIAGNOSIS — E1165 Type 2 diabetes mellitus with hyperglycemia: Secondary | ICD-10-CM

## 2021-09-24 DIAGNOSIS — M1A0410 Idiopathic chronic gout, right hand, without tophus (tophi): Secondary | ICD-10-CM | POA: Diagnosis not present

## 2021-09-24 DIAGNOSIS — K219 Gastro-esophageal reflux disease without esophagitis: Secondary | ICD-10-CM

## 2021-09-24 DIAGNOSIS — R053 Chronic cough: Secondary | ICD-10-CM | POA: Diagnosis not present

## 2021-09-24 DIAGNOSIS — E785 Hyperlipidemia, unspecified: Secondary | ICD-10-CM

## 2021-09-24 DIAGNOSIS — M25561 Pain in right knee: Secondary | ICD-10-CM

## 2021-09-24 DIAGNOSIS — E559 Vitamin D deficiency, unspecified: Secondary | ICD-10-CM | POA: Diagnosis not present

## 2021-09-24 DIAGNOSIS — M25562 Pain in left knee: Secondary | ICD-10-CM | POA: Diagnosis not present

## 2021-09-24 MED ORDER — TIZANIDINE HCL 2 MG PO TABS: 1.0000 mg | ORAL_TABLET | Freq: Three times a day (TID) | ORAL | 1 refills | Status: DC | PRN

## 2021-09-24 MED ORDER — FLUTICASONE PROPIONATE HFA 220 MCG/ACT IN AERO: 1.0000 | INHALATION_SPRAY | Freq: Two times a day (BID) | RESPIRATORY_TRACT | 3 refills | Status: DC | PRN

## 2021-09-24 MED ORDER — HYDROCODONE BIT-HOMATROP MBR 5-1.5 MG/5ML PO SOLN
5.0000 mL | Freq: Every evening | ORAL | 0 refills | Status: DC | PRN
Start: 2021-09-24 — End: 2022-06-11

## 2021-09-24 NOTE — Progress Notes (Signed)
MyChart Video Visit    Virtual Visit via Video Note   This visit type was conducted due to national recommendations for restrictions regarding the COVID-19 Pandemic (e.g. social distancing) in an effort to limit this patient's exposure and mitigate transmission in our community. This patient is at least at moderate risk for complications without adequate follow up. This format is felt to be most appropriate for this patient at this time. Physical exam was limited by quality of the video and audio technology used for the visit. S Chism, CMA was able to get the patient set up on a video visit.  Patient location: home, Patient and provider in visit Provider location: Office  I discussed the limitations of evaluation and management by telemedicine and the availability of in person appointments. The patient expressed understanding and agreed to proceed.  Visit Date: 09/24/2021  Today's healthcare provider: Penni Homans, MD     Subjective:    Patient ID: Heidi Franklin, female    DOB: [birth year], 75 y.o.   MRN: [id number]  No chief complaint on file.   HPI Patient is in today for evaluation of numerous concerns. She had a day of 491 upon recheck was 416. But since then she has cut the carbs and drinking more water and eating more protein and her sugars have remained in the 140s. No acute illness or stressor, she thinks maybe it is something she ate but is not sure. This was before she was given Prednisone for a fall that caused left shoulder and knee pain. Her sugars have not sparked since then. Had a fall roughly 8 days ago and left shoulder pain has flared. It is not debilitating but still present. She tripped over a box on the floor she did not see and was evaluated in ER. No serious injury found. Denies CP/palp/SOB/HA/fevers/GI or GU c/o. Taking meds as prescribed   Past Medical History:  Diagnosis Date   ALLERGIC RHINITIS    Allergy    Anxiety state, unspecified    Arthritis     Asthma    BRONCHITIS, CHRONIC    Cataract    b/l   Chronic low back pain 10/09/2016   DEPRESSION    DIABETES MELLITUS, TYPE II    GERD    HYPERLIPIDEMIA    HYPERTENSION    Pneumonia    Psoriasis     Past Surgical History:  Procedure Laterality Date   ABDOMINAL HYSTERECTOMY  1980   CARPAL TUNNEL RELEASE Right    TRIGGER FINGER RELEASE Left    Index finger    Family History  Problem Relation Age of Onset   Stroke Mother    Angina Mother    Hyperlipidemia Mother    Hypertension Mother    Heart disease Mother    Depression Mother    Diabetes Father    Hyperlipidemia Other        Parent   Hypertension Other        Parent   Diabetes Sister        x1   CAD Sister    COPD Sister        smoker   Hyperlipidemia Sister    Hypertension Sister    Diabetes Brother        x 2   Hyperlipidemia Brother    Hypertension Brother    Diabetes Brother    Hyperlipidemia Brother    Hypertension Brother    CAD Sister    Arthritis Sister  Hyperlipidemia Sister    Hypertension Sister    Colon cancer Neg Hx    Esophageal cancer Neg Hx    Rectal cancer Neg Hx    Stomach cancer Neg Hx    Pancreatic cancer Neg Hx     Social History   Socioeconomic History   Marital status: Married    Spouse name: Not on file   Number of children: 0   Years of education: 4yr colge   Highest education level: Not on file  Occupational History   Occupation: Retired   Tobacco Use   Smoking status: Never   Smokeless tobacco: Never  Vaping Use   Vaping Use: Never used  Substance and Sexual Activity   Alcohol use: No    Alcohol/week: 0.0 standard drinks   Drug use: No   Sexual activity: Not Currently  Other Topics Concern   Not on file  Social History Narrative   Married, lives with spouse-retired from [employer] insurance   Not employed    Drinks coffee occasional, Consumes 1 soda a day    No dietary restrictions   Social Determinants of Radio broadcast assistant Strain: Low Risk     Difficulty of Paying Living Expenses: Not very hard  Food Insecurity: No Food Insecurity   Worried About Charity fundraiser in the Last Year: Never true   Arboriculturist in the Last Year: Never true  Transportation Needs: No Transportation Needs   Lack of Transportation (Medical): No   Lack of Transportation (Non-Medical): No  Physical Activity: Inactive   Days of Exercise per Week: 0 days   Minutes of Exercise per Session: 0 min  Stress: Stress Concern Present   Feeling of Stress : To some extent  Social Connections: Engineer, building services of Communication with Friends and Family: More than three times a week   Frequency of Social Gatherings with Friends and Family: More than three times a week   Attends Religious Services: More than 4 times per year   Active Member of [employer] or Organizations: Yes   Attends Archivist Meetings: 1 to 4 times per year   Marital Status: Married  Human resources officer Violence: Not At Risk   Fear of Current or Ex-Partner: No   Emotionally Abused: No   Physically Abused: No   Sexually Abused: No    Outpatient Medications Prior to Visit  Medication Sig Dispense Refill   acetaminophen (TYLENOL) 325 MG tablet Take 650 mg by mouth every 6 (six) hours as needed.     albuterol (VENTOLIN HFA) 108 (90 Base) MCG/ACT inhaler INHALE 1-2 PUFFS INTO THE LUNGS EVERY 6 (SIX) HOURS AS NEEDED FOR WHEEZING OR SHORTNESS OF BREATH. 8.5 each 3   ALPRAZolam (XANAX) 0.5 MG tablet TAKE 1 TABLET BY MOUTH 2 TIMES DAILY AS NEEDED FOR ANXIETY. 60 tablet 1   amLODipine (NORVASC) 10 MG tablet TAKE 1 TABLET BY MOUTH EVERY DAY 90 tablet 1   betamethasone dipropionate (DIPROLENE) 0.05 % ointment Apply to affected areas twice a day and then as needed flare/itch     Cholecalciferol (VITAMIN D3) 50 MCG (2000 UT) capsule Take 2,000 Units by mouth daily.     fluticasone (CUTIVATE) 0.05 % cream APPLY TO AFFECTED AREA TWICE A DAY 1 WEEK ON 1 WEEK OFF PRN FLARE     fluticasone  (FLONASE) 50 MCG/ACT nasal spray SPRAY 2 SPRAYS INTO EACH NOSTRIL EVERY DAY 48 mL 2   glimepiride (AMARYL) 1 MG tablet TAKE 1  TABLET BY MOUTH EVERY DAY WITH BREAKFAST 90 tablet 1   glucose blood (ONE TOUCH ULTRA TEST) test strip 1 each by Other route daily. Use 1 strips to check blood sugar twice a day Ex E11.9 100 each 3   HUMIRA PEN-PS/UV/ADOL HS START 40 MG/0.8ML PNKT Every 2 weeks     ketoconazole (NIZORAL) 2 % cream      Lancets (ONETOUCH ULTRASOFT) lancets PRN 100 each 3   losartan-hydrochlorothiazide (HYZAAR) 100-12.5 MG tablet TAKE 1 TABLET BY MOUTH EVERY DAY 90 tablet 1   lovastatin (MEVACOR) 20 MG tablet TAKE 1 TABLET BY MOUTH EVERYDAY AT BEDTIME 90 tablet 0   mometasone (ELOCON) 0.1 % ointment      montelukast (SINGULAIR) 10 MG tablet TAKE 1 TABLET BY MOUTH EVERYDAY AT BEDTIME 90 tablet 1   Multiple Vitamins-Minerals (CENTRUM SILVER PO) Take 1 tablet by mouth every morning.     pantoprazole (PROTONIX) 40 MG tablet TAKE 1 TABLET (40 MG TOTAL) BY MOUTH 2 (TWO) TIMES DAILY BEFORE A MEAL. 180 tablet 0   Potassium Chloride ER 20 MEQ TBCR TAKE 1 TABLET BY MOUTH EVERY DAY 90 tablet 1   predniSONE (DELTASONE) 20 MG tablet Take 1 tablet (20 mg total) by mouth 2 (two) times daily with a meal. 10 tablet 0   valACYclovir (VALTREX) 500 MG tablet Take 500 mg by mouth 2 (two) times daily.  0   HYDROcodone bit-homatropine (HYCODAN) 5-1.5 MG/5ML syrup Take 5 mLs by mouth at bedtime as needed for cough. 90 mL 0   Multiple Vitamin (MULTI-VITAMIN) tablet      No facility-administered medications prior to visit.    Allergies  Allergen Reactions   Metformin And Related Diarrhea   Penicillins Rash    Review of Systems  Constitutional:  Negative for fever.  HENT:  Positive for congestion.   Eyes:  Negative for blurred vision.  Respiratory:  Positive for cough, sputum production and wheezing. Negative for shortness of breath.   Cardiovascular:  Positive for palpitations. Negative for chest pain and  leg swelling.  Gastrointestinal:  Negative for abdominal pain, blood in stool and nausea.  Genitourinary:  Negative for dysuria and frequency.  Musculoskeletal:  Positive for falls, joint pain and myalgias.  Skin:  Negative for rash.  Neurological:  Negative for dizziness, loss of consciousness and headaches.  Endo/Heme/Allergies:  Negative for environmental allergies.  Psychiatric/Behavioral:  Positive for memory loss. Negative for depression. The patient is not nervous/anxious.       Objective:    Physical Exam Constitutional:      General: She is not in acute distress.    Appearance: Normal appearance. She is not ill-appearing or toxic-appearing.  HENT:     Head: Normocephalic and atraumatic.     Right Ear: External ear normal.     Left Ear: External ear normal.     Nose: Nose normal.  Eyes:     General:        Right eye: No discharge.        Left eye: No discharge.  Pulmonary:     Effort: Pulmonary effort is normal.  Skin:    Findings: No rash.  Neurological:     Mental Status: She is alert and oriented to person, place, and time.  Psychiatric:        Behavior: Behavior normal.    There were no vitals taken for this visit. Wt Readings from Last 3 Encounters:  09/22/21 158 lb (71.7 kg)  09/07/21 163 lb (73.9 kg)  07/31/21 161 lb 6.4 oz (73.2 kg)    Diabetic Foot Exam - Simple   No data filed    Lab Results  Component Value Date   WBC 7.4 02/20/2021   HGB 11.4 (L) 02/20/2021   HCT 34.0 (L) 02/20/2021   PLT 271.0 02/20/2021   GLUCOSE 136 (H) 03/21/2021   CHOL 136 02/20/2021   TRIG 146.0 02/20/2021   HDL 33.30 (L) 02/20/2021   LDLDIRECT 155.1 02/09/2013   LDLCALC 73 02/20/2021   ALT 14 03/21/2021   AST 14 03/21/2021   NA 141 03/21/2021   K 3.6 03/21/2021   CL 105 03/21/2021   CREATININE 0.87 03/21/2021   BUN 17 03/21/2021   CO2 28 03/21/2021   TSH 5.23 (H) 02/20/2021   HGBA1C 6.7 (H) 02/20/2021   MICROALBUR 1.6 08/08/2016    Lab Results   Component Value Date   TSH 5.23 (H) 02/20/2021   Lab Results  Component Value Date   WBC 7.4 02/20/2021   HGB 11.4 (L) 02/20/2021   HCT 34.0 (L) 02/20/2021   MCV 90.3 02/20/2021   PLT 271.0 02/20/2021   Lab Results  Component Value Date   NA 141 03/21/2021   K 3.6 03/21/2021   CO2 28 03/21/2021   GLUCOSE 136 (H) 03/21/2021   BUN 17 03/21/2021   CREATININE 0.87 03/21/2021   BILITOT 0.4 03/21/2021   ALKPHOS 60 03/21/2021   AST 14 03/21/2021   ALT 14 03/21/2021   PROT 6.9 03/21/2021   ALBUMIN 3.9 03/21/2021   CALCIUM 9.5 03/21/2021   ANIONGAP 7 10/14/2015   GFR 64.90 03/21/2021   Lab Results  Component Value Date   CHOL 136 02/20/2021   Lab Results  Component Value Date   HDL 33.30 (L) 02/20/2021   Lab Results  Component Value Date   LDLCALC 73 02/20/2021   Lab Results  Component Value Date   TRIG 146.0 02/20/2021   Lab Results  Component Value Date   CHOLHDL 4 02/20/2021   Lab Results  Component Value Date   HGBA1C 6.7 (H) 02/20/2021       Assessment & Plan:   Problem List Items Addressed This Visit     Hyperlipidemia associated with type 2 diabetes mellitus (Central) - Primary   Relevant Orders   Lipid panel   Essential hypertension    Monitor and report any concerns, no changes to meds. Encouraged heart healthy diet such as the DASH diet and exercise as tolerated.       Relevant Orders   CBC   Comprehensive metabolic panel   TSH   GERD    Avoid offending foods, start probiotics. Do not eat large meals in late evening and consider raising head of bed.       Bursitis of left shoulder    Had a fall roughly 8 days ago and left shoulder pain has flared. It is not debilitating but still present she is encouraged to alternate heat and ice and use topical treatments such a Aspercreme. Given a prescription for Tizanidine to use prn and if no improvement she will notify us so we can refer for further consideration.      Cough    Has struggled on and  off since childhood and has been flared for past 2 weeks. Is on Prednisone for a fall that caused knee pain and left arm. But still coughing. Her Promethazine and Hydromet cough syrups but helps some. Finish Prednisone and increase Claritin to bid, restart Flonase qd and take  Singulair daily, given refill on Hydromet      Gout    Hydrate and monitor      Vitamin D deficiency    Supplement and monitor      Relevant Orders   VITAMIN D 25 Hydroxy (Vit-D Deficiency, Fractures)   Vitiligo    Right heel light colored patch, she receives a Humira injections every 2 weeks and she will mention it to them and have them evaluate      Type 2 diabetes mellitus with hyperglycemia (HCC)    hgba1c acceptable, minimize simple carbs. Increase exercise as tolerated. Continue current meds. She had a day of 491 upon recheck was 416. But since then she has cut the carbs and drinking more water and eating more protein and her sugars have remained in the 140s       Relevant Orders   Hemoglobin A1c   Acute bilateral knee pain    Had a fall roughly 8 days ago and left shoulder pain has flared. It is not debilitating but still present she is encouraged to alternate heat and ice and use topical treatments such a Aspercreme. Given a prescription for Tizanidine to use prn and if no improvement she will notify us so we can refer for further consideration.       I have discontinued Vesta Mixer. Czarnecki's Multi-Vitamin. I am also having her start on tiZANidine and fluticasone. Additionally, I am having her maintain her Multiple Vitamins-Minerals (CENTRUM SILVER PO), mometasone, acetaminophen, valACYclovir, onetouch ultrasoft, glucose blood, Humira Pen-Ps/UV/Adol HS Start, betamethasone dipropionate, fluticasone, Vitamin D3, fluticasone, ketoconazole, glimepiride, Potassium Chloride ER, albuterol, amLODipine, montelukast, losartan-hydrochlorothiazide, ALPRAZolam, lovastatin, pantoprazole, predniSONE, and HYDROcodone  bit-homatropine.  Meds ordered this encounter  Medications   tiZANidine (ZANAFLEX) 2 MG tablet    Sig: Take 0.5-2 tablets (1-4 mg total) by mouth every 8 (eight) hours as needed for muscle spasms.    Dispense:  40 tablet    Refill:  1   fluticasone (FLOVENT HFA) 220 MCG/ACT inhaler    Sig: Inhale 1 puff into the lungs 2 (two) times daily as needed.    Dispense:  1 each    Refill:  3   HYDROcodone bit-homatropine (HYCODAN) 5-1.5 MG/5ML syrup    Sig: Take 5 mLs by mouth at bedtime as needed for cough.    Dispense:  120 mL    Refill:  0    I discussed the assessment and treatment plan with the patient. The patient was provided an opportunity to ask questions and all were answered. The patient agreed with the plan and demonstrated an understanding of the instructions.   The patient was advised to call back or seek an in-person evaluation if the symptoms worsen or if the condition fails to improve as anticipated.  I provided 40 minutes of face-to-face time during this encounter.   Penni Homans, MD [facility] [phone] (phone) [phone] (fax)  [place]

## 2021-09-24 NOTE — Assessment & Plan Note (Signed)
Avoid offending foods, start probiotics. Do not eat large meals in late evening and consider raising head of bed.  

## 2021-09-24 NOTE — Assessment & Plan Note (Signed)
Right heel light colored patch, she receives a Humira injections every 2 weeks and she will mention it to them and have them evaluate

## 2021-09-24 NOTE — Assessment & Plan Note (Signed)
Supplement and monitor 

## 2021-09-24 NOTE — Assessment & Plan Note (Signed)
Had a fall roughly 8 days ago and left shoulder pain has flared. It is not debilitating but still present she is encouraged to alternate heat and ice and use topical treatments such a Aspercreme. Given a prescription for Tizanidine to use prn and if no improvement she will notify us so we can refer for further consideration.

## 2021-09-24 NOTE — Assessment & Plan Note (Signed)
Hydrate and monitor 

## 2021-09-24 NOTE — Assessment & Plan Note (Addendum)
Has struggled on and off since childhood and has been flared for past 2 weeks. Is on Prednisone for a fall that caused knee pain and left arm. But still coughing. Her Promethazine and Hydromet cough syrups but helps some. Finish Prednisone and increase Claritin to bid, restart Flonase qd and take Singulair daily, given refill on Hydromet

## 2021-09-24 NOTE — Assessment & Plan Note (Signed)
Monitor and report any concerns, no changes to meds. Encouraged heart healthy diet such as the DASH diet and exercise as tolerated.  ?

## 2021-09-24 NOTE — Assessment & Plan Note (Signed)
hgba1c acceptable, minimize simple carbs. Increase exercise as tolerated. Continue current meds. She had a day of 491 upon recheck was 416. But since then she has cut the carbs and drinking more water and eating more protein and her sugars have remained in the 140s

## 2021-09-25 ENCOUNTER — Other Ambulatory Visit (HOSPITAL_BASED_OUTPATIENT_CLINIC_OR_DEPARTMENT_OTHER): Payer: Self-pay

## 2021-09-25 DIAGNOSIS — Z23 Encounter for immunization: Secondary | ICD-10-CM | POA: Diagnosis not present

## 2021-09-25 MED ORDER — MODERNA COVID-19 BIVAL BOOSTER 50 MCG/0.5ML IM SUSP
INTRAMUSCULAR | 0 refills | Status: DC
  Filled 2021-09-25: qty 0.5, 1d supply, fill #0

## 2021-09-25 NOTE — Telephone Encounter (Signed)
Patient will call insurance to check her formulary and let us know.

## 2021-09-26 ENCOUNTER — Other Ambulatory Visit (INDEPENDENT_AMBULATORY_CARE_PROVIDER_SITE_OTHER): Payer: Medicare Other

## 2021-09-26 DIAGNOSIS — I10 Essential (primary) hypertension: Secondary | ICD-10-CM | POA: Diagnosis not present

## 2021-09-26 DIAGNOSIS — E1169 Type 2 diabetes mellitus with other specified complication: Secondary | ICD-10-CM

## 2021-09-26 DIAGNOSIS — E559 Vitamin D deficiency, unspecified: Secondary | ICD-10-CM

## 2021-09-26 DIAGNOSIS — E1165 Type 2 diabetes mellitus with hyperglycemia: Secondary | ICD-10-CM | POA: Diagnosis not present

## 2021-09-26 DIAGNOSIS — E785 Hyperlipidemia, unspecified: Secondary | ICD-10-CM | POA: Diagnosis not present

## 2021-09-26 LAB — CBC
HCT: 36.5 % (ref 36.0–46.0)
Hemoglobin: 11.9 g/dL — ABNORMAL LOW (ref 12.0–15.0)
MCHC: 32.7 g/dL (ref 30.0–36.0)
MCV: 92 fl (ref 78.0–100.0)
Platelets: 278 10*3/uL (ref 150.0–400.0)
RBC: 3.97 Mil/uL (ref 3.87–5.11)
RDW: 13.6 % (ref 11.5–15.5)
WBC: 13.7 10*3/uL — ABNORMAL HIGH (ref 4.0–10.5)

## 2021-09-26 LAB — COMPREHENSIVE METABOLIC PANEL
ALT: 15 U/L (ref 0–35)
AST: 18 U/L (ref 0–37)
Albumin: 4.1 g/dL (ref 3.5–5.2)
Alkaline Phosphatase: 41 U/L (ref 39–117)
BUN: 26 mg/dL — ABNORMAL HIGH (ref 6–23)
CO2: 28 mEq/L (ref 19–32)
Calcium: 9.5 mg/dL (ref 8.4–10.5)
Chloride: 105 mEq/L (ref 96–112)
Creatinine, Ser: 1 mg/dL (ref 0.40–1.20)
GFR: 54.71 mL/min — ABNORMAL LOW (ref >60.00)
Glucose, Bld: 76 mg/dL (ref 70–99)
Potassium: 3.4 mEq/L — ABNORMAL LOW (ref 3.5–5.1)
Sodium: 142 mEq/L (ref 135–145)
Total Bilirubin: 0.3 mg/dL (ref 0.2–1.2)
Total Protein: 7.3 g/dL (ref 6.0–8.3)

## 2021-09-26 LAB — LIPID PANEL
Cholesterol: 171 mg/dL (ref 0–200)
HDL: 45 mg/dL (ref >39.00)
LDL Cholesterol: 99 mg/dL (ref 0–99)
NonHDL: 125.55
Total CHOL/HDL Ratio: 4
Triglycerides: 133 mg/dL (ref 0.0–149.0)
VLDL: 26.6 mg/dL (ref 0.0–40.0)

## 2021-09-26 LAB — TSH: TSH: 4.74 u[IU]/mL (ref 0.35–5.50)

## 2021-09-26 LAB — HEMOGLOBIN A1C: Hgb A1c MFr Bld: 6.9 % — ABNORMAL HIGH (ref 4.6–6.5)

## 2021-09-26 LAB — VITAMIN D 25 HYDROXY (VIT D DEFICIENCY, FRACTURES): VITD: 60.53 ng/mL (ref 30.00–100.00)

## 2021-09-27 DIAGNOSIS — L400 Psoriasis vulgaris: Secondary | ICD-10-CM | POA: Diagnosis not present

## 2021-10-04 ENCOUNTER — Ambulatory Visit (INDEPENDENT_AMBULATORY_CARE_PROVIDER_SITE_OTHER): Payer: Medicare Other | Admitting: Sports Medicine

## 2021-10-04 ENCOUNTER — Other Ambulatory Visit: Payer: Self-pay

## 2021-10-04 ENCOUNTER — Encounter: Payer: Self-pay | Admitting: Sports Medicine

## 2021-10-04 DIAGNOSIS — M79675 Pain in left toe(s): Secondary | ICD-10-CM

## 2021-10-04 DIAGNOSIS — E119 Type 2 diabetes mellitus without complications: Secondary | ICD-10-CM | POA: Diagnosis not present

## 2021-10-04 DIAGNOSIS — M79674 Pain in right toe(s): Secondary | ICD-10-CM

## 2021-10-04 DIAGNOSIS — B351 Tinea unguium: Secondary | ICD-10-CM

## 2021-10-04 NOTE — Progress Notes (Signed)
Subjective: Heidi Franklin is a 75 y.o. female patient with history of diabetes who returns to office today for nail trim.  Patient is diabetic with blood sugars not recorded this visit.  Reports that she has some discoloration to the right heel.  Patient denies any other pedal complaints at this time.  Patient Active Problem List   Diagnosis Date Noted   Type 2 diabetes mellitus with hyperglycemia ([place]) 09/24/2021   Acute bilateral knee pain 09/24/2021   Vitiligo 09/07/2021   Pedal edema 03/27/2020   Toe ulcer ([place]) 03/10/2020   Body mass index (BMI) 31.0-31.9, adult 11/08/2019   Disc displacement, lumbar 11/08/2019   Strain of lumbar paraspinal muscle 11/08/2019   History of chicken pox 04/11/2019   Vitamin D deficiency 04/05/2019   Lumbar radiculopathy 03/31/2019   Neck pain 07/06/2018   Trapezius muscle strain, right, initial encounter 06/23/2018   Edema 06/23/2018   Gout 04/08/2018   Herpes simplex 02/10/2018   ETD (eustachian tube dysfunction) 02/10/2018   Peroneal tendinitis, right 01/06/2018   Psoriasis 11/14/2017   Acute bursitis of right shoulder 09/29/2017   Bilateral hip pain 07/10/2017   Bronchiectasis without complication ([place]) 61/60/7371   Irritable larynx syndrome 04/25/2017   Reflux laryngitis 04/23/2017   Benign lipomatous neoplasm of skin and subcutaneous tissue of right leg 02/25/2017   Anemia 02/05/2017   Fatigue 01/23/2017   Cough 01/02/2017   Multiple pulmonary nodules 11/10/2016   Chronic low back pain 10/09/2016   Degenerative arthritis of right knee 06/20/2016   Degenerative arthritis of left knee 04/10/2016   Bronchitis 12/15/2015   Peroneal tendinitis of left lower extremity 09/05/2015   Recurrent falls 08/08/2015   Bursitis of left shoulder 07/13/2015   Morbid obesity due to excess calories ([place]) 04/16/2015   Nonspecific abnormal electrocardiogram (ECG) (EKG) 05/11/2011   Hyperlipidemia associated with type 2 diabetes mellitus ([place]) 03/02/2010    Depression with anxiety 03/02/2010   Essential hypertension 03/02/2010   Chronic rhinitis 03/02/2010   Upper airway cough syndrome 03/02/2010   GERD 03/02/2010   Current Outpatient Medications on File Prior to Visit  Medication Sig Dispense Refill   acetaminophen (TYLENOL) 325 MG tablet Take 650 mg by mouth every 6 (six) hours as needed.     albuterol (VENTOLIN HFA) 108 (90 Base) MCG/ACT inhaler INHALE 1-2 PUFFS INTO THE LUNGS EVERY 6 (SIX) HOURS AS NEEDED FOR WHEEZING OR SHORTNESS OF BREATH. 8.5 each 3   ALPRAZolam (XANAX) 0.5 MG tablet TAKE 1 TABLET BY MOUTH 2 TIMES DAILY AS NEEDED FOR ANXIETY. 60 tablet 1   amLODipine (NORVASC) 10 MG tablet TAKE 1 TABLET BY MOUTH EVERY DAY 90 tablet 1   betamethasone dipropionate (DIPROLENE) 0.05 % ointment Apply to affected areas twice a day and then as needed flare/itch     Cholecalciferol (VITAMIN D3) 50 MCG (2000 UT) capsule Take 2,000 Units by mouth daily.     COVID-19 mRNA bivalent vaccine, Moderna, (MODERNA COVID-19 BIVAL BOOSTER) 50 MCG/0.5ML injection Inject into the muscle. 0.5 mL 0   fluticasone (CUTIVATE) 0.05 % cream APPLY TO AFFECTED AREA TWICE A DAY 1 WEEK ON 1 WEEK OFF PRN FLARE     fluticasone (FLONASE) 50 MCG/ACT nasal spray SPRAY 2 SPRAYS INTO EACH NOSTRIL EVERY DAY 48 mL 2   fluticasone (FLOVENT HFA) 220 MCG/ACT inhaler Inhale 1 puff into the lungs 2 (two) times daily as needed. 1 each 3   glimepiride (AMARYL) 1 MG tablet TAKE 1 TABLET BY MOUTH EVERY DAY WITH BREAKFAST 90 tablet  1   glucose blood (ONE TOUCH ULTRA TEST) test strip 1 each by Other route daily. Use 1 strips to check blood sugar twice a day Ex E11.9 100 each 3   HUMIRA PEN-PS/UV/ADOL HS START 40 MG/0.8ML PNKT Every 2 weeks     HYDROcodone bit-homatropine (HYCODAN) 5-1.5 MG/5ML syrup Take 5 mLs by mouth at bedtime as needed for cough. 120 mL 0   ketoconazole (NIZORAL) 2 % cream      Lancets (ONETOUCH ULTRASOFT) lancets PRN 100 each 3   losartan-hydrochlorothiazide (HYZAAR)  100-12.5 MG tablet TAKE 1 TABLET BY MOUTH EVERY DAY 90 tablet 1   lovastatin (MEVACOR) 20 MG tablet TAKE 1 TABLET BY MOUTH EVERYDAY AT BEDTIME 90 tablet 0   mometasone (ELOCON) 0.1 % ointment      montelukast (SINGULAIR) 10 MG tablet TAKE 1 TABLET BY MOUTH EVERYDAY AT BEDTIME 90 tablet 1   Multiple Vitamins-Minerals (CENTRUM SILVER PO) Take 1 tablet by mouth every morning.     pantoprazole (PROTONIX) 40 MG tablet TAKE 1 TABLET (40 MG TOTAL) BY MOUTH 2 (TWO) TIMES DAILY BEFORE A MEAL. 180 tablet 0   Potassium Chloride ER 20 MEQ TBCR TAKE 1 TABLET BY MOUTH EVERY DAY 90 tablet 1   predniSONE (DELTASONE) 20 MG tablet Take 1 tablet (20 mg total) by mouth 2 (two) times daily with a meal. 10 tablet 0   tiZANidine (ZANAFLEX) 2 MG tablet Take 0.5-2 tablets (1-4 mg total) by mouth every 8 (eight) hours as needed for muscle spasms. 40 tablet 1   valACYclovir (VALTREX) 500 MG tablet Take 500 mg by mouth 2 (two) times daily.  0   No current facility-administered medications on file prior to visit.   Allergies  Allergen Reactions   Metformin And Related Diarrhea   Penicillins Rash    Recent Results (from the past 2160 hour(s))  VITAMIN D 25 Hydroxy (Vit-D Deficiency, Fractures)     Status: None   Collection Time: 09/26/21  9:44 AM  Result Value Ref Range   VITD 60.53 30.00 - 100.00 ng/mL  TSH     Status: None   Collection Time: 09/26/21  9:44 AM  Result Value Ref Range   TSH 4.74 0.35 - 5.50 uIU/mL  Lipid panel     Status: None   Collection Time: 09/26/21  9:44 AM  Result Value Ref Range   Cholesterol 171 0 - 200 mg/dL    Comment: ATP III Classification       Desirable:  < 200 mg/dL               Borderline High:  200 - 239 mg/dL          High:  > = 240 mg/dL   Triglycerides 133.0 0.0 - 149.0 mg/dL    Comment: Normal:  <150 mg/dLBorderline High:  150 - 199 mg/dL   HDL 45.00 >39.00 mg/dL   VLDL 26.6 0.0 - 40.0 mg/dL   LDL Cholesterol 99 0 - 99 mg/dL   Total CHOL/HDL Ratio 4     Comment:                 Men          Women1/2 Average Risk     3.4          3.3Average Risk          5.0          4.42X Average Risk          9.6  7.13X Average Risk          15.0          11.0                       NonHDL 125.55     Comment: NOTE:  Non-HDL goal should be 30 mg/dL higher than patient's LDL goal (i.e. LDL goal of < 70 mg/dL, would have non-HDL goal of < 100 mg/dL)  Comprehensive metabolic panel     Status: Abnormal   Collection Time: 09/26/21  9:44 AM  Result Value Ref Range   Sodium 142 135 - 145 mEq/L   Potassium 3.4 (L) 3.5 - 5.1 mEq/L   Chloride 105 96 - 112 mEq/L   CO2 28 19 - 32 mEq/L   Glucose, Bld 76 70 - 99 mg/dL   BUN 26 (H) 6 - 23 mg/dL   Creatinine, Ser 1.00 0.40 - 1.20 mg/dL   Total Bilirubin 0.3 0.2 - 1.2 mg/dL   Alkaline Phosphatase 41 39 - 117 U/L   AST 18 0 - 37 U/L   ALT 15 0 - 35 U/L   Total Protein 7.3 6.0 - 8.3 g/dL   Albumin 4.1 3.5 - 5.2 g/dL   GFR 54.71 (L) >60.00 mL/min    Comment: Calculated using the CKD-EPI Creatinine Equation (2021)   Calcium 9.5 8.4 - 10.5 mg/dL  CBC     Status: Abnormal   Collection Time: 09/26/21  9:44 AM  Result Value Ref Range   WBC 13.7 (H) 4.0 - 10.5 K/uL   RBC 3.97 3.87 - 5.11 Mil/uL   Platelets 278.0 150.0 - 400.0 K/uL   Hemoglobin 11.9 (L) 12.0 - 15.0 g/dL   HCT 36.5 36.0 - 46.0 %   MCV 92.0 78.0 - 100.0 fl   MCHC 32.7 30.0 - 36.0 g/dL   RDW 13.6 11.5 - 15.5 %  Hemoglobin A1c     Status: Abnormal   Collection Time: 09/26/21  9:44 AM  Result Value Ref Range   Hgb A1c MFr Bld 6.9 (H) 4.6 - 6.5 %    Comment: Glycemic Control Guidelines for People with Diabetes:Non Diabetic:  <6%Goal of Therapy: <7%Additional Action Suggested:  >8%     Objective: General: Patient is awake, alert, and oriented x 3 and in no acute distress.  Integument: Skin is warm, dry and supple bilateral. Nails are tender, long, thickened and dystrophic with subungual debris, consistent with onychomycosis, 1-5 bilateral.  No signs of infection.  No open lesions or preulcerative lesions present bilateral.  Discoloration on the back of the right heels from previous steroid administration.  Remaining integument unremarkable.  Vasculature:  Dorsalis Pedis pulse 2/4 bilateral. Posterior Tibial pulse  1/4 bilateral.  Capillary fill time <3 sec 1-5 bilateral. Positive hair growth to the level of the digits. Temperature gradient within normal limits. No varicosities present bilateral. No edema present bilateral.   Neurology: Intact.  Unchanged from prior.  Musculoskeletal: Asymptomatic pes planus pedal deformities noted bilateral.  Muscular strength 5/5 in all lower extremity muscular groups bilateral without pain on range of motion, chronic occasional tenderness at right achilles, thompson sign negative No tenderness with calf compression bilateral.  Assessment and Plan: Problem List Items Addressed This Visit   None Visit Diagnoses     Pain due to onychomycosis of toenails of both feet    -  Primary   Diabetes mellitus without complication ([place])          -Examined  patient -Re-Discussed and educated patient on diabetic foot care, especially with  regards to the vascular, neurological and musculoskeletal systems.  -Mechanically debrided all nails 1-5 bilateral using sterile nail nipper and filed with dremel without incident  -Reminded patient that we discussed discoloration at the back of her right heel last visit and that it is due from steroid administration -Answered all patient questions -Return in 3 months for nail care or sooner if problems arise -Patient advised to call the office if any problems or questions arise in the meantime.  Landis Martins, DPM

## 2021-10-10 DIAGNOSIS — L400 Psoriasis vulgaris: Secondary | ICD-10-CM | POA: Diagnosis not present

## 2021-10-14 ENCOUNTER — Encounter: Payer: Self-pay | Admitting: Family Medicine

## 2021-10-17 DIAGNOSIS — H25813 Combined forms of age-related cataract, bilateral: Secondary | ICD-10-CM | POA: Diagnosis not present

## 2021-10-17 DIAGNOSIS — E113293 Type 2 diabetes mellitus with mild nonproliferative diabetic retinopathy without macular edema, bilateral: Secondary | ICD-10-CM | POA: Diagnosis not present

## 2021-10-17 DIAGNOSIS — H524 Presbyopia: Secondary | ICD-10-CM | POA: Diagnosis not present

## 2021-10-17 DIAGNOSIS — H52223 Regular astigmatism, bilateral: Secondary | ICD-10-CM | POA: Diagnosis not present

## 2021-10-17 DIAGNOSIS — H43813 Vitreous degeneration, bilateral: Secondary | ICD-10-CM | POA: Diagnosis not present

## 2021-10-17 DIAGNOSIS — H5212 Myopia, left eye: Secondary | ICD-10-CM | POA: Diagnosis not present

## 2021-10-17 DIAGNOSIS — H04123 Dry eye syndrome of bilateral lacrimal glands: Secondary | ICD-10-CM | POA: Diagnosis not present

## 2021-10-17 LAB — HM DIABETES EYE EXAM

## 2021-10-17 NOTE — Telephone Encounter (Signed)
Pt scheduled for tomorrow

## 2021-10-18 ENCOUNTER — Encounter: Payer: Self-pay | Admitting: Family Medicine

## 2021-10-18 ENCOUNTER — Other Ambulatory Visit: Payer: Self-pay

## 2021-10-18 ENCOUNTER — Ambulatory Visit (INDEPENDENT_AMBULATORY_CARE_PROVIDER_SITE_OTHER): Payer: Medicare Other | Admitting: Family Medicine

## 2021-10-18 ENCOUNTER — Ambulatory Visit (HOSPITAL_BASED_OUTPATIENT_CLINIC_OR_DEPARTMENT_OTHER)
Admission: RE | Admit: 2021-10-18 | Discharge: 2021-10-18 | Disposition: A | Discharge status: Home / Self Care | Payer: Medicare Other | Source: Ambulatory Visit | Attending: Family Medicine | Admitting: Family Medicine

## 2021-10-18 VITALS — BP 110/58 | HR 84 | Temp 98.6°F | Resp 16 | Wt 161.2 lb

## 2021-10-18 DIAGNOSIS — R9431 Abnormal electrocardiogram [ECG] [EKG]: Secondary | ICD-10-CM | POA: Diagnosis not present

## 2021-10-18 DIAGNOSIS — R059 Cough, unspecified: Secondary | ICD-10-CM | POA: Diagnosis not present

## 2021-10-18 DIAGNOSIS — K5900 Constipation, unspecified: Secondary | ICD-10-CM | POA: Insufficient documentation

## 2021-10-18 DIAGNOSIS — J040 Acute laryngitis: Secondary | ICD-10-CM

## 2021-10-18 DIAGNOSIS — R109 Unspecified abdominal pain: Secondary | ICD-10-CM | POA: Diagnosis not present

## 2021-10-18 DIAGNOSIS — K219 Gastro-esophageal reflux disease without esophagitis: Secondary | ICD-10-CM | POA: Diagnosis not present

## 2021-10-18 DIAGNOSIS — R0602 Shortness of breath: Secondary | ICD-10-CM

## 2021-10-18 DIAGNOSIS — J40 Bronchitis, not specified as acute or chronic: Secondary | ICD-10-CM | POA: Diagnosis not present

## 2021-10-18 DIAGNOSIS — E785 Hyperlipidemia, unspecified: Secondary | ICD-10-CM

## 2021-10-18 DIAGNOSIS — E1169 Type 2 diabetes mellitus with other specified complication: Secondary | ICD-10-CM

## 2021-10-18 DIAGNOSIS — D72829 Elevated white blood cell count, unspecified: Secondary | ICD-10-CM

## 2021-10-18 DIAGNOSIS — R194 Change in bowel habit: Secondary | ICD-10-CM | POA: Insufficient documentation

## 2021-10-18 DIAGNOSIS — J310 Chronic rhinitis: Secondary | ICD-10-CM

## 2021-10-18 DIAGNOSIS — R058 Other specified cough: Secondary | ICD-10-CM

## 2021-10-18 DIAGNOSIS — R0789 Other chest pain: Secondary | ICD-10-CM

## 2021-10-18 DIAGNOSIS — R079 Chest pain, unspecified: Secondary | ICD-10-CM | POA: Diagnosis not present

## 2021-10-18 DIAGNOSIS — I10 Essential (primary) hypertension: Secondary | ICD-10-CM | POA: Diagnosis not present

## 2021-10-18 DIAGNOSIS — R490 Dysphonia: Secondary | ICD-10-CM | POA: Diagnosis not present

## 2021-10-18 MED ORDER — DOXYCYCLINE HYCLATE 100 MG PO TABS: 100.0000 mg | ORAL_TABLET | Freq: Two times a day (BID) | ORAL | 0 refills | Status: DC

## 2021-10-18 NOTE — Assessment & Plan Note (Signed)
She notes her cough is not worse from her baseline and when it is productive at times it is clear.

## 2021-10-18 NOTE — Assessment & Plan Note (Signed)
Well controlled, no changes to meds. Encouraged heart healthy diet such as the DASH diet and exercise as tolerated.  °

## 2021-10-18 NOTE — Assessment & Plan Note (Signed)
If no improvement will need to increase Zyrtec to bid and return to pulmonology and consider allergy work up

## 2021-10-18 NOTE — Assessment & Plan Note (Signed)
Mild tachycardia and nonspecific T wave changes. With atypical chest pain and risk factors she is referred to cardiology for further evaluation and encouraged to seek care if symptoms escalate

## 2021-10-18 NOTE — Progress Notes (Signed)
Patient ID: Heidi Franklin, female    DOB: 01-02-1946  Age: 75 y.o. MRN: [id number]    Subjective:   Chief Complaint  Patient presents with   chest congestion    Subjective   HPI Heidi Franklin presents for office visit today for follow up on heartburn and coughs. She has c/o chest tightness/SOB and belching with worsening acid reflux.  Chest discomfort w/ SOB: Onset of chest discomfort started this week and chest tightness started a couple of weeks ago. She is still experiencing coughs, but are currently stable and not productive. However, the coughs worsen at night. She is also experiencing congestion and postnasal drip that is clear. She denies any fevers, CP, or tachycardia.  Sometimes gets eyes watery, she already takes zyrtec. Albuterol helps with the congestion.  Belching/acid reflux: She experienced belching that is prevalent after eating fatty foods or laying down. She then developed heartburn with frequency of a couple of times a week. She takes pepto bismol when she is experiencing. Sometimes takes pantoprazole once daily instead of BID. Denies CP/palp/HA/fevers or GU c/o. Taking meds as prescribed. She is also experiencing constipation with small stool.   Review of Systems  Constitutional:  Negative for chills, fatigue and fever.  HENT:  Positive for postnasal drip. Negative for congestion, rhinorrhea, sinus pressure, sinus pain, sore throat and trouble swallowing.   Eyes:  Negative for pain.  Respiratory:  Positive for cough, chest tightness and shortness of breath.   Cardiovascular:  Negative for chest pain, palpitations and leg swelling.  Gastrointestinal:  Positive for constipation. Negative for abdominal pain, blood in stool, diarrhea, nausea and vomiting.  Genitourinary:  Negative for decreased urine volume, flank pain, frequency, vaginal bleeding and vaginal discharge.  Musculoskeletal:  Negative for back pain.  Neurological:  Negative for headaches.   History Past  Medical History:  Diagnosis Date   ALLERGIC RHINITIS    Allergy    Anxiety state, unspecified    Arthritis    Asthma    BRONCHITIS, CHRONIC    Cataract    b/l   Chronic low back pain 10/09/2016   DEPRESSION    DIABETES MELLITUS, TYPE II    GERD    HYPERLIPIDEMIA    HYPERTENSION    Pneumonia    Psoriasis     She has a past surgical history that includes Abdominal hysterectomy (1980); Trigger finger release (Left); and Carpal tunnel release (Right).   Her family history includes Angina in her mother; Arthritis in her sister; CAD in her sister and sister; COPD in her sister; Depression in her mother; Diabetes in her brother, brother, father, and sister; Heart disease in her mother; Hyperlipidemia in her brother, brother, mother, sister, sister, and another family member; Hypertension in her brother, brother, mother, sister, sister, and another family member; Stroke in her mother.She reports that she has never smoked. She has never used smokeless tobacco. She reports that she does not drink alcohol and does not use drugs.  Current Outpatient Medications on File Prior to Visit  Medication Sig Dispense Refill   acetaminophen (TYLENOL) 325 MG tablet Take 650 mg by mouth every 6 (six) hours as needed.     albuterol (VENTOLIN HFA) 108 (90 Base) MCG/ACT inhaler INHALE 1-2 PUFFS INTO THE LUNGS EVERY 6 (SIX) HOURS AS NEEDED FOR WHEEZING OR SHORTNESS OF BREATH. 8.5 each 3   ALPRAZolam (XANAX) 0.5 MG tablet TAKE 1 TABLET BY MOUTH 2 TIMES DAILY AS NEEDED FOR ANXIETY. 60 tablet 1   amLODipine (  NORVASC) 10 MG tablet TAKE 1 TABLET BY MOUTH EVERY DAY 90 tablet 1   betamethasone dipropionate (DIPROLENE) 0.05 % ointment Apply to affected areas twice a day and then as needed flare/itch     Cholecalciferol (VITAMIN D3) 50 MCG (2000 UT) capsule Take 2,000 Units by mouth daily.     fluticasone (CUTIVATE) 0.05 % cream APPLY TO AFFECTED AREA TWICE A DAY 1 WEEK ON 1 WEEK OFF PRN FLARE     fluticasone (FLONASE)  50 MCG/ACT nasal spray SPRAY 2 SPRAYS INTO EACH NOSTRIL EVERY DAY 48 mL 2   fluticasone (FLOVENT HFA) 220 MCG/ACT inhaler Inhale 1 puff into the lungs 2 (two) times daily as needed. 1 each 3   glimepiride (AMARYL) 1 MG tablet TAKE 1 TABLET BY MOUTH EVERY DAY WITH BREAKFAST 90 tablet 1   glucose blood (ONE TOUCH ULTRA TEST) test strip 1 each by Other route daily. Use 1 strips to check blood sugar twice a day Ex E11.9 100 each 3   HUMIRA PEN-PS/UV/ADOL HS START 40 MG/0.8ML PNKT Every 2 weeks     HYDROcodone bit-homatropine (HYCODAN) 5-1.5 MG/5ML syrup Take 5 mLs by mouth at bedtime as needed for cough. 120 mL 0   ketoconazole (NIZORAL) 2 % cream      Lancets (ONETOUCH ULTRASOFT) lancets PRN 100 each 3   losartan-hydrochlorothiazide (HYZAAR) 100-12.5 MG tablet TAKE 1 TABLET BY MOUTH EVERY DAY 90 tablet 1   lovastatin (MEVACOR) 20 MG tablet TAKE 1 TABLET BY MOUTH EVERYDAY AT BEDTIME 90 tablet 0   mometasone (ELOCON) 0.1 % ointment      montelukast (SINGULAIR) 10 MG tablet TAKE 1 TABLET BY MOUTH EVERYDAY AT BEDTIME 90 tablet 1   Multiple Vitamins-Minerals (CENTRUM SILVER PO) Take 1 tablet by mouth every morning.     pantoprazole (PROTONIX) 40 MG tablet TAKE 1 TABLET (40 MG TOTAL) BY MOUTH 2 (TWO) TIMES DAILY BEFORE A MEAL. 180 tablet 0   Potassium Chloride ER 20 MEQ TBCR TAKE 1 TABLET BY MOUTH EVERY DAY 90 tablet 1   predniSONE (DELTASONE) 20 MG tablet Take 1 tablet (20 mg total) by mouth 2 (two) times daily with a meal. 10 tablet 0   tiZANidine (ZANAFLEX) 2 MG tablet Take 0.5-2 tablets (1-4 mg total) by mouth every 8 (eight) hours as needed for muscle spasms. 40 tablet 1   valACYclovir (VALTREX) 500 MG tablet Take 500 mg by mouth 2 (two) times daily.  0   No current facility-administered medications on file prior to visit.     Objective:  Objective  Physical Exam Constitutional:      General: She is not in acute distress.    Appearance: Normal appearance. She is not ill-appearing or  toxic-appearing.  HENT:     Head: Normocephalic and atraumatic.     Right Ear: Tympanic membrane, ear canal and external ear normal.     Left Ear: Tympanic membrane, ear canal and external ear normal.     Ears:     Comments: - slightly dry earwax    Nose: No congestion or rhinorrhea.  Eyes:     Extraocular Movements: Extraocular movements intact.     Pupils: Pupils are equal, round, and reactive to light.  Cardiovascular:     Rate and Rhythm: Normal rate and regular rhythm.     Pulses: Normal pulses.     Heart sounds: Normal heart sounds. No murmur heard. Pulmonary:     Effort: Pulmonary effort is normal. No respiratory distress.  Breath sounds: Examination of the right-lower field reveals wheezing. Wheezing (squeek) present. No rhonchi or rales.  Abdominal:     General: Bowel sounds are normal.     Palpations: Abdomen is soft. There is no mass.     Tenderness: There is no abdominal tenderness. There is no guarding.     Hernia: No hernia is present.  Musculoskeletal:        General: Normal range of motion.     Cervical back: Normal range of motion and neck supple.  Skin:    General: Skin is warm and dry.  Neurological:     Mental Status: She is alert and oriented to person, place, and time.  Psychiatric:        Behavior: Behavior normal.   BP (!) 110/58    Pulse 84    Temp 98.6 F (37 C)    Resp 16    Wt 161 lb 3.2 oz (73.1 kg)    SpO2 93%    BMI 31.48 kg/m  Wt Readings from Last 3 Encounters:  10/18/21 161 lb 3.2 oz (73.1 kg)  09/22/21 158 lb (71.7 kg)  09/07/21 163 lb (73.9 kg)     Lab Results  Component Value Date   WBC 13.7 (H) 09/26/2021   HGB 11.9 (L) 09/26/2021   HCT 36.5 09/26/2021   PLT 278.0 09/26/2021   GLUCOSE 76 09/26/2021   CHOL 171 09/26/2021   TRIG 133.0 09/26/2021   HDL 45.00 09/26/2021   LDLDIRECT 155.1 02/09/2013   LDLCALC 99 09/26/2021   ALT 15 09/26/2021   AST 18 09/26/2021   NA 142 09/26/2021   K 3.4 (L) 09/26/2021   CL 105  09/26/2021   CREATININE 1.00 09/26/2021   BUN 26 (H) 09/26/2021   CO2 28 09/26/2021   TSH 4.74 09/26/2021   HGBA1C 6.9 (H) 09/26/2021   MICROALBUR 1.6 08/08/2016    DG Knee AP/LAT W/Sunrise Left  Result Date: 09/22/2021 CLINICAL DATA:  Recent trauma, pain EXAM: LEFT KNEE 3 VIEWS COMPARISON:  None. FINDINGS: No display fracture or dislocation is seen. There is minimal cortical angulation without definite break in the cortical margin along the articular surface of lateral condyle of the distal femur which may be related to degenerative arthritis or residual change from previous injury. Degenerative changes are noted with bony spurs in medial, lateral and patellofemoral compartments, more so in the medial compartment. Chondrocalcinosis is seen in the medial compartment. There is no significant effusion. IMPRESSION: No recent displaced fracture or dislocation is seen in the left knee. Degenerative changes are noted, more so in the medial compartment. There is minimal cortical undulation in the articular surface of lateral condyle of the distal left femur without break in the cortical margins. This may be related to degenerative arthritis or residual change from previous injury. If the symptoms persist, follow-up radiographic examination along with MRI as warranted may be considered. Electronically Signed   By: Elmer Picker M.D.   On: 09/22/2021 14:59   DG Knee AP/LAT W/Sunrise Right  Result Date: 09/22/2021 CLINICAL DATA:  Recent fall, pain EXAM: RIGHT KNEE 3 VIEWS COMPARISON:  None FINDINGS: No recent fracture or dislocation is seen. There is no significant effusion. Degenerative changes are noted with bony spurs in medial, lateral and patellofemoral compartments. Chondrocalcinosis is seen in the lateral compartment. IMPRESSION: No recent fracture is seen. Degenerative changes are noted in the right knee. Electronically Signed   By: Elmer Picker M.D.   On: 09/22/2021 15:00  Assessment & Plan:  Plan    Meds ordered this encounter  Medications   doxycycline (VIBRA-TABS) 100 MG tablet    Sig: Take 1 tablet (100 mg total) by mouth 2 (two) times daily.    Dispense:  20 tablet    Refill:  0    Problem List Items Addressed This Visit     Hyperlipidemia associated with type 2 diabetes mellitus ([place])    Encourage heart healthy diet such as MIND or DASH diet, increase exercise, avoid trans fats, simple carbohydrates and processed foods, consider a krill or fish or flaxseed oil cap daily.       Essential hypertension    Well controlled, no changes to meds. Encouraged heart healthy diet such as the DASH diet and exercise as tolerated.       Relevant Orders   Comprehensive metabolic panel   Chronic rhinitis    If no improvement will need to increase Zyrtec to bid and return to pulmonology and consider allergy work up      Upper airway cough syndrome    She notes her cough is not worse from her baseline and when it is productive at times it is clear.       GERD    Using Pantoprazole 40 mg qam most days will add Famotidine 40 mg qhs and Avoid offending foods, start probiotics. Do not eat large meals in late evening and consider raising head of bed. Referred to GI in [facility] for convenience for further evaluation      Relevant Orders   Ambulatory referral to Gastroenterology   Nonspecific abnormal electrocardiogram (ECG) (EKG)    Mild tachycardia and nonspecific T wave changes. With atypical chest pain and risk factors she is referred to cardiology for further evaluation and encouraged to seek care if symptoms escalate      Bronchitis    Severe reflux, worsening tightness in chest and SOB with exertion and cough, wheeze in RLL. Started on Doxycycline and probiotics and cxr obtained.       Reflux laryngitis    Now with hoarseness, referred to ENT for reevaluation.       Constipation - Primary    Worsening constipation now moving bowels every 4th day  with small movements. Will obtain AXR and is started on Miralax and benefiber daily and to titrate to response. Encouraged increased hydration and fiber in diet. Daily probiotics. If bowels not moving can use MOM 2 tbls po in 4 oz of warm prune juice by mouth every 2-3 days. If no results then repeat in 4 hours with  Dulcolax suppository pr, may repeat again in 4 more hours as needed. Seek care if symptoms worsen. Consider daily Miralax and/or Dulcolax if symptoms persist.       Relevant Orders   Ambulatory referral to Gastroenterology   DG Abd 2 Views   Other Visit Diagnoses     SOB (shortness of breath)       Relevant Orders   EKG 12-Lead (Completed)   DG Chest 2 View   Hoarseness of voice       Relevant Orders   Ambulatory referral to ENT   Leukocytosis, unspecified type       Relevant Orders   CBC w/Diff   Atypical chest pain       Relevant Orders   Ambulatory referral to Cardiology       Follow-up: No follow-ups on file.  Irish Elders, acting as a scribe for Penni Homans, MD, have documented  all relevent documentation on behalf of Penni Homans, MD, as directed by Penni Homans, MD while in the presence of Penni Homans, MD. DO:10/18/21.  Claude Manges, MD personally performed the services described in this documentation. All medical record entries made by the scribe were at my direction and in my presence. I have reviewed the chart and agree that the record reflects my personal performance and is accurate and complete

## 2021-10-18 NOTE — Assessment & Plan Note (Signed)
Using Pantoprazole 40 mg qam most days will add Famotidine 40 mg qhs and Avoid offending foods, start probiotics. Do not eat large meals in late evening and consider raising head of bed. Referred to GI in [facility] for convenience for further evaluation

## 2021-10-18 NOTE — Assessment & Plan Note (Signed)
Now with hoarseness, referred to ENT for reevaluation.

## 2021-10-18 NOTE — Assessment & Plan Note (Signed)
Worsening constipation now moving bowels every 4th day with small movements. Will obtain AXR and is started on Miralax and benefiber daily and to titrate to response. Encouraged increased hydration and fiber in diet. Daily probiotics. If bowels not moving can use MOM 2 tbls po in 4 oz of warm prune juice by mouth every 2-3 days. If no results then repeat in 4 hours with  Dulcolax suppository pr, may repeat again in 4 more hours as needed. Seek care if symptoms worsen. Consider daily Miralax and/or Dulcolax if symptoms persist.

## 2021-10-18 NOTE — Patient Instructions (Addendum)
Encouraged increased hydration and fiber in diet. Daily probiotics (check at pharmacy downstairs). If bowels not moving can use MOM 2 tbls po in 4 oz of warm prune juice by mouth every 2-3 days. If no results then repeat in 4 hours with  Dulcolax suppository pr, may repeat again in 4 more hours as needed. Seek care if symptoms worsen. Consider daily Miralax and/or Dulcolax if symptoms persist.   Heartburn Heartburn is a type of pain or discomfort that can happen in your throat or chest. It is often described as a burning pain. It may also cause a bad, acid-like taste in your mouth. It may be caused by stomach contents that move back up (reflux) into the part of the body that moves food from your mouth to your stomach (esophagus). Heartburn may feel worse: When you lie down. When you bend over. At night. Follow these instructions at home: Eating and drinking  Avoid certain foods and drinks as told by your doctor. This may include: Coffee and tea, with or without caffeine. Drinks that have alcohol. Energy drinks and sports drinks. Carbonated drinks or sodas. Chocolate and cocoa. Peppermint and mint flavorings. Garlic and onions. Horseradish. Spicy and acidic foods, such as: Peppers. Chili powder and curry powder. Vinegar. Hot sauces and BBQ sauce. Citrus fruit juices and citrus fruits, such as: Oranges. Lemons. Limes. Tomato-based foods, such as: Red sauce and pizza with red sauce. Chili. Salsa. Fried and fatty foods, such as: Donuts. [place] fries and potato chips. High-fat dressings. High-fat meats, such as: Hot dogs and sausage. Rib eye steak. Ham and bacon. High-fat dairy items, such as: Whole milk. Butter. Cream cheese. Eat small meals often. Avoid eating large meals. Avoid drinking large amounts of liquid with your meals. Avoid eating meals during the 2-3 hours before bedtime. Avoid lying down right after you eat. Do not exercise right after you eat. Lifestyle    If you are overweight, lose an amount of weight that is healthy for you. Ask your doctor about a safe weight loss goal. Do not smoke or use any products that contain nicotine or tobacco. These can make your symptoms worse. If you need help quitting, ask your doctor. Wear loose clothes. Do not wear anything tight around your waist. Raise (elevate) the head of your bed about 6 inches (15 cm) when you sleep. You can use a wedge to do this. Try to lower your stress. If you need help doing this, ask your doctor. Medicines Take over-the-counter and prescription medicines only as told by your doctor. Do not take aspirin or NSAIDs, such as ibuprofen, unless your doctor says it is okay. Stop medicines only as told by your doctor. If you stop taking some medicines too quickly, your symptoms may get worse. General instructions Watch for any changes in your symptoms. Keep all follow-up visits. Contact a doctor if: You have new symptoms. You lose weight and you do not know why. You have trouble swallowing, or it hurts to swallow. You have wheezing or a cough that keeps happening. Your symptoms do not get better with treatment. You have heartburn often for more than 2 weeks. Get help right away if: You have pain in your arms, neck, jaw, teeth, or back all of a sudden. You feel sweaty, dizzy, or light-headed all of a sudden. You have chest pain or shortness of breath. You vomit and your vomit looks like blood or coffee grounds. Your poop (stool) is bloody or black. These symptoms may be an emergency.  Get help right away. Call your local emergency services (911 in the [place] Do not wait to see if the symptoms will go away. Do not drive yourself to the hospital. Summary Heartburn is a type of pain that can happen in your throat or chest. It can feel like a burning pain. It may also cause a bad, acid-like taste in your mouth. You may need to avoid certain foods and drinks to help your symptoms. Ask  your doctor what foods and drinks you should avoid. Take over-the-counter and prescription medicines only as told by your doctor. Do not take aspirin or NSAIDs, such as ibuprofen, unless your doctor told you to do so. Contact your doctor if your symptoms do not get better or they get worse. This information is not intended to replace advice given to you by your health care provider. Make sure you discuss any questions you have with your health care provider. Document Revised: 04/19/2020 Document Reviewed: 04/19/2020 Elsevier Patient Education  [place].

## 2021-10-18 NOTE — Assessment & Plan Note (Signed)
Encourage heart healthy diet such as MIND or DASH diet, increase exercise, avoid trans fats, simple carbohydrates and processed foods, consider a krill or fish or flaxseed oil cap daily.  °

## 2021-10-18 NOTE — Assessment & Plan Note (Signed)
Severe reflux, worsening tightness in chest and SOB with exertion and cough, wheeze in RLL. Started on Doxycycline and probiotics and cxr obtained.

## 2021-10-19 LAB — CBC WITH DIFFERENTIAL/PLATELET
Basophils Absolute: 0.1 10*3/uL (ref 0.0–0.1)
Basophils Relative: 0.8 % (ref 0.0–3.0)
Eosinophils Absolute: 0.3 10*3/uL (ref 0.0–0.7)
Eosinophils Relative: 3.7 % (ref 0.0–5.0)
HCT: 34.9 % — ABNORMAL LOW (ref 36.0–46.0)
Hemoglobin: 11.4 g/dL — ABNORMAL LOW (ref 12.0–15.0)
Lymphocytes Relative: 42.8 % (ref 12.0–46.0)
Lymphs Abs: 3.1 10*3/uL (ref 0.7–4.0)
MCHC: 32.5 g/dL (ref 30.0–36.0)
MCV: 92.5 fl (ref 78.0–100.0)
Monocytes Absolute: 0.7 10*3/uL (ref 0.1–1.0)
Monocytes Relative: 9.6 % (ref 3.0–12.0)
Neutro Abs: 3.1 10*3/uL (ref 1.4–7.7)
Neutrophils Relative %: 43.1 % (ref 43.0–77.0)
Platelets: 263 10*3/uL (ref 150.0–400.0)
RBC: 3.78 Mil/uL — ABNORMAL LOW (ref 3.87–5.11)
RDW: 13.4 % (ref 11.5–15.5)
WBC: 7.3 10*3/uL (ref 4.0–10.5)

## 2021-10-19 LAB — COMPREHENSIVE METABOLIC PANEL
ALT: 14 U/L (ref 0–35)
AST: 19 U/L (ref 0–37)
Albumin: 4.1 g/dL (ref 3.5–5.2)
Alkaline Phosphatase: 49 U/L (ref 39–117)
BUN: 21 mg/dL (ref 6–23)
CO2: 30 mEq/L (ref 19–32)
Calcium: 10.1 mg/dL (ref 8.4–10.5)
Chloride: 104 mEq/L (ref 96–112)
Creatinine, Ser: 1.1 mg/dL (ref 0.40–1.20)
GFR: 48.78 mL/min — ABNORMAL LOW (ref >60.00)
Glucose, Bld: 82 mg/dL (ref 70–99)
Potassium: 3.8 mEq/L (ref 3.5–5.1)
Sodium: 143 mEq/L (ref 135–145)
Total Bilirubin: 0.3 mg/dL (ref 0.2–1.2)
Total Protein: 7.2 g/dL (ref 6.0–8.3)

## 2021-10-23 ENCOUNTER — Other Ambulatory Visit: Payer: Self-pay

## 2021-10-23 DIAGNOSIS — D649 Anemia, unspecified: Secondary | ICD-10-CM

## 2021-10-24 DIAGNOSIS — L400 Psoriasis vulgaris: Secondary | ICD-10-CM | POA: Diagnosis not present

## 2021-10-30 ENCOUNTER — Other Ambulatory Visit (INDEPENDENT_AMBULATORY_CARE_PROVIDER_SITE_OTHER): Payer: Medicare Other

## 2021-10-30 ENCOUNTER — Other Ambulatory Visit: Payer: Self-pay

## 2021-10-30 DIAGNOSIS — D649 Anemia, unspecified: Secondary | ICD-10-CM | POA: Diagnosis not present

## 2021-10-30 DIAGNOSIS — Z20822 Contact with and (suspected) exposure to covid-19: Secondary | ICD-10-CM | POA: Diagnosis not present

## 2021-11-02 LAB — FECAL OCCULT BLOOD, IMMUNOCHEMICAL: Fecal Occult Bld: NEGATIVE

## 2021-11-07 ENCOUNTER — Other Ambulatory Visit: Payer: Self-pay | Admitting: Family Medicine

## 2021-11-07 DIAGNOSIS — L400 Psoriasis vulgaris: Secondary | ICD-10-CM | POA: Diagnosis not present

## 2021-11-15 DIAGNOSIS — J189 Pneumonia, unspecified organism: Secondary | ICD-10-CM | POA: Insufficient documentation

## 2021-11-15 DIAGNOSIS — M1990 Unspecified osteoarthritis, unspecified site: Secondary | ICD-10-CM | POA: Insufficient documentation

## 2021-11-15 DIAGNOSIS — J45909 Unspecified asthma, uncomplicated: Secondary | ICD-10-CM | POA: Insufficient documentation

## 2021-11-15 DIAGNOSIS — H269 Unspecified cataract: Secondary | ICD-10-CM | POA: Insufficient documentation

## 2021-11-15 DIAGNOSIS — T7840XA Allergy, unspecified, initial encounter: Secondary | ICD-10-CM | POA: Insufficient documentation

## 2021-11-19 NOTE — Progress Notes (Signed)
Cardiology Office Note:    Date:  11/20/2021   ID:  [name], [place] [birth year], MRN [id number]  PCP:  [name], MD  Cardiologist:  Shirlee More, MD   Referring MD: [name], MD  ASSESSMENT:    1. Precordial pain   2. Hyperlipidemia associated with type 2 diabetes mellitus ([place])   3. Type 2 diabetes mellitus with hyperglycemia, without long-term current use of insulin ([place])   4. Essential hypertension    PLAN:    In order of problems listed above:  She is a very high risk patient age diabetes hypertension hyperlipidemia and is having chronic chest pain most consistent with possible angina.  Further evaluation is appropriate she has had a previous normal perfusion study I think she is best served by cardiac CTA will be able to get the presence or absence of CAD severity and undoubtedly her coronary calcium score will be very high with multivessel calcification on previous CT scan.  In the interim she will continue her aspirin daily and I gave her prescription for nitroglycerin to take if she has severe symptoms.  If she has severe and flow-limiting stenosis she would benefit from revascularization. She will continue her statin if she has significant CAD would benefit from a high intensity such as rosuvastatin Stable diabetes A1c at target managed by her PCP on oral agent Stable continue current treatment including ARB thiazide diuretic  Next appointment 8 weeks   Medication Adjustments/Labs and Tests Ordered: Current medicines are reviewed at length with the patient today.  Concerns regarding medicines are outlined above.  Orders Placed This Encounter  Procedures   CT CORONARY MORPH W/CTA COR W/SCORE W/CA W/CM &/OR WO/CM   Basic metabolic panel   EKG [id number]   Meds ordered this encounter  Medications   nitroGLYCERIN (NITROSTAT) 0.4 MG SL tablet    Sig: Place 1 tablet (0.4 mg total) under the tongue every 5 (five) minutes as needed.    Dispense:  30 tablet     Refill:  3   metoprolol tartrate (LOPRESSOR) 100 MG tablet    Sig: Take 1 tablet (100 mg total) by mouth once for 1 dose. Take two hours prior to your cardiac CT    Dispense:  1 tablet    Refill:  0     Chief Complaint  Patient presents with   Chest Pain    History of Present Illness:    Heidi Franklin is a 76 y.o. female type 2 diabetes hypertension and hyperlipidemia who is being seen today for the evaluation of chest pain at the request of [name], MD.  Chest x-ray 10/18/2021 normal She had a chest CT performed July 2018 showing aortic atherosclerosis as well as the coronary arteries with calcified plaque in left main left anterior descending and left circumflex right coronary artery calcification the aortic valve and a aberrant right subclavian artery. Echocardiogram 09/18/2016 showed normal left ventricular size wall height and systolic function EF 55 to 60% and mild aortic valve sclerosis She had a myocardial perfusion study 09/06/2016 low risk EF 65% normal perfusion poor exercise capacity at 4.6 METS and hypertensive blood pressure response.  Last month she has had recurrent episodes of chest discomfort she describes as a pressure nonexertional but relieved spontaneously the longest episode lasted about 3 minutes and she has several week.  She has a history of reflux but this is not reflux in nature she has a history of asthma but it is not  associated with bronchospasm with physical effort she finds her self breathless but does not get chest discomfort.  She has had a previous evaluation in 2017 with a normal myocardial perfusion study. Chest pain is moderate in severity does not radiate not pleuritic no associated diaphoresis or GI symptoms. Past Medical History:  Diagnosis Date   ALLERGIC RHINITIS    Allergy    Anxiety state, unspecified    Arthritis    Asthma    BRONCHITIS, CHRONIC    Cataract    b/l   Chronic low back pain 10/09/2016   DEPRESSION    DIABETES  MELLITUS, TYPE II    GERD    HYPERLIPIDEMIA    HYPERTENSION    Pneumonia    Psoriasis     Past Surgical History:  Procedure Laterality Date   ABDOMINAL HYSTERECTOMY  1980   CARPAL TUNNEL RELEASE Right    TRIGGER FINGER RELEASE Left    Index finger    Current Medications: Current Meds  Medication Sig   acetaminophen (TYLENOL) 325 MG tablet Take 650 mg by mouth every 6 (six) hours as needed for mild pain or fever.   albuterol (VENTOLIN HFA) 108 (90 Base) MCG/ACT inhaler INHALE 1-2 PUFFS INTO THE LUNGS EVERY 6 (SIX) HOURS AS NEEDED FOR WHEEZING OR SHORTNESS OF BREATH.   ALPRAZolam (XANAX) 0.5 MG tablet TAKE 1 TABLET BY MOUTH 2 TIMES DAILY AS NEEDED FOR ANXIETY.   amLODipine (NORVASC) 10 MG tablet TAKE 1 TABLET BY MOUTH EVERY DAY   aspirin 81 MG chewable tablet Chew 81 mg by mouth daily.   betamethasone dipropionate (DIPROLENE) 0.05 % ointment Apply to affected areas twice a day and then as needed flare/itch   Cholecalciferol (VITAMIN D3) 50 MCG (2000 UT) capsule Take 2,000 Units by mouth daily.   fluticasone (CUTIVATE) 0.05 % cream APPLY TO AFFECTED AREA TWICE A DAY 1 WEEK ON 1 WEEK OFF PRN FLARE   fluticasone (FLONASE) 50 MCG/ACT nasal spray SPRAY 2 SPRAYS INTO EACH NOSTRIL EVERY DAY   glimepiride (AMARYL) 1 MG tablet TAKE 1 TABLET BY MOUTH EVERY DAY WITH BREAKFAST   glucose blood (ONE TOUCH ULTRA TEST) test strip 1 each by Other route daily. Use 1 strips to check blood sugar twice a day Ex E11.9   HUMIRA PEN-PS/UV/ADOL HS START 40 MG/0.8ML PNKT Every 2 weeks   HYDROcodone bit-homatropine (HYCODAN) 5-1.5 MG/5ML syrup Take 5 mLs by mouth at bedtime as needed for cough.   ketoconazole (NIZORAL) 2 % cream    Lancets (ONETOUCH ULTRASOFT) lancets PRN   losartan-hydrochlorothiazide (HYZAAR) 100-12.5 MG tablet TAKE 1 TABLET BY MOUTH EVERY DAY   lovastatin (MEVACOR) 20 MG tablet TAKE 1 TABLET BY MOUTH EVERYDAY AT BEDTIME   metoprolol tartrate (LOPRESSOR) 100 MG tablet Take 1 tablet (100 mg  total) by mouth once for 1 dose. Take two hours prior to your cardiac CT   mometasone (ELOCON) 0.1 % ointment    montelukast (SINGULAIR) 10 MG tablet TAKE 1 TABLET BY MOUTH EVERYDAY AT BEDTIME   Multiple Vitamins-Minerals (CENTRUM SILVER PO) Take 1 tablet by mouth every morning.   nitroGLYCERIN (NITROSTAT) 0.4 MG SL tablet Place 1 tablet (0.4 mg total) under the tongue every 5 (five) minutes as needed.   pantoprazole (PROTONIX) 40 MG tablet TAKE 1 TABLET (40 MG TOTAL) BY MOUTH 2 (TWO) TIMES DAILY BEFORE A MEAL.   Potassium Chloride ER 20 MEQ TBCR TAKE 1 TABLET BY MOUTH EVERY DAY   tiZANidine (ZANAFLEX) 2 MG tablet Take 0.5-2 tablets (1-4  mg total) by mouth every 8 (eight) hours as needed for muscle spasms.   valACYclovir (VALTREX) 500 MG tablet Take 500 mg by mouth 2 (two) times daily.     Allergies:   Metformin and related and Penicillins   Social History   Socioeconomic History   Marital status: Married    Spouse name: Not on file   Number of children: 0   Years of education: 18yr colge   Highest education level: Not on file  Occupational History   Occupation: Retired   Tobacco Use   Smoking status: Never    Passive exposure: Never   Smokeless tobacco: Never  Vaping Use   Vaping Use: Never used  Substance and Sexual Activity   Alcohol use: No    Alcohol/week: 0.0 standard drinks   Drug use: No   Sexual activity: Not Currently  Other Topics Concern   Not on file  Social History Narrative   Married, lives with spouse-retired from [employer] insurance   Not employed    Drinks coffee occasional, Consumes 1 soda a day    No dietary restrictions   Social Determinants of Radio broadcast assistant Strain: Low Risk    Difficulty of Paying Living Expenses: Not very hard  Food Insecurity: No Food Insecurity   Worried About Charity fundraiser in the Last Year: Never true   Arboriculturist in the Last Year: Never true  Transportation Needs: No Transportation Needs   Lack of  Transportation (Medical): No   Lack of Transportation (Non-Medical): No  Physical Activity: Inactive   Days of Exercise per Week: 0 days   Minutes of Exercise per Session: 0 min  Stress: Stress Concern Present   Feeling of Stress : To some extent  Social Connections: Engineer, building services of Communication with Friends and Family: More than three times a week   Frequency of Social Gatherings with Friends and Family: More than three times a week   Attends Religious Services: More than 4 times per year   Active Member of [employer] or Organizations: Yes   Attends Music therapist: 1 to 4 times per year   Marital Status: Married     Family History: The patient's her mother had CAD died age 28 and a sister in her 69s also has coronary artery disease family history includes Angina in her mother; Arthritis in her sister; CAD in her sister and sister; COPD in her sister; Depression in her mother; Diabetes in her brother, brother, father, and sister; Heart disease in her mother; Hyperlipidemia in her brother, brother, mother, sister, sister, and another family member; Hypertension in her brother, brother, mother, sister, sister, and another family member; Stroke in her mother. There is no history of Colon cancer, Esophageal cancer, Rectal cancer, Stomach cancer, or Pancreatic cancer. Her husband has had surgical aortic valve replacement ROS:   ROS Please see the history of present illness.     All other systems reviewed and are negative.  EKGs/Labs/Other Studies Reviewed:    The following studies were reviewed today:   EKG:  EKG is  ordered today.  The ekg ordered today is personally reviewed and demonstrates sinus rhythm normal EKG  Recent Labs: 09/26/2021: TSH 4.74 10/18/2021: ALT 14; BUN 21; Creatinine, Ser 1.10; Hemoglobin 11.4; Platelets 263.0; Potassium 3.8; Sodium 143  Recent Lipid Panel    Component Value Date/Time   CHOL 171 09/26/2021 0944   TRIG 133.0  09/26/2021 0944   HDL 45.00  09/26/2021 0944   CHOLHDL 4 09/26/2021 0944   VLDL 26.6 09/26/2021 0944   LDLCALC 99 09/26/2021 0944   LDLDIRECT 155.1 02/09/2013 1113    Physical Exam:    VS:  BP 109/68 (BP Location: Left Arm)    Pulse 99    Ht 5' (1.524 m)    Wt 160 lb (72.6 kg)    SpO2 99%    BMI 31.25 kg/m     Wt Readings from Last 3 Encounters:  11/20/21 160 lb (72.6 kg)  10/18/21 161 lb 3.2 oz (73.1 kg)  09/22/21 158 lb (71.7 kg)     GEN:  Well nourished, well developed in no acute distress HEENT: Normal NECK: No JVD; No carotid bruits LYMPHATICS: No lymphadenopathy CARDIAC: RRR, no murmurs, rubs, gallops RESPIRATORY:  Clear to auscultation without rales, wheezing or rhonchi  ABDOMEN: Soft, non-tender, non-distended MUSCULOSKELETAL:  No edema; No deformity  SKIN: Warm and dry NEUROLOGIC:  Alert and oriented x 3 PSYCHIATRIC:  Normal affect     Signed, Shirlee More, MD  11/20/2021 3:26 PM    [facility]

## 2021-11-20 ENCOUNTER — Other Ambulatory Visit: Payer: Self-pay

## 2021-11-20 ENCOUNTER — Encounter: Payer: Self-pay | Admitting: Cardiology

## 2021-11-20 ENCOUNTER — Ambulatory Visit (INDEPENDENT_AMBULATORY_CARE_PROVIDER_SITE_OTHER): Payer: Medicare Other | Admitting: Cardiology

## 2021-11-20 VITALS — BP 109/68 | HR 99 | Ht 60.0 in | Wt 160.0 lb

## 2021-11-20 DIAGNOSIS — E1165 Type 2 diabetes mellitus with hyperglycemia: Secondary | ICD-10-CM

## 2021-11-20 DIAGNOSIS — I10 Essential (primary) hypertension: Secondary | ICD-10-CM

## 2021-11-20 DIAGNOSIS — E785 Hyperlipidemia, unspecified: Secondary | ICD-10-CM | POA: Diagnosis not present

## 2021-11-20 DIAGNOSIS — E1169 Type 2 diabetes mellitus with other specified complication: Secondary | ICD-10-CM | POA: Diagnosis not present

## 2021-11-20 DIAGNOSIS — R072 Precordial pain: Secondary | ICD-10-CM | POA: Diagnosis not present

## 2021-11-20 MED ORDER — NITROGLYCERIN 0.4 MG SL SUBL: 0.4000 mg | SUBLINGUAL_TABLET | SUBLINGUAL | 3 refills | Status: AC | PRN

## 2021-11-20 MED ORDER — METOPROLOL TARTRATE 100 MG PO TABS: 100.0000 mg | ORAL_TABLET | Freq: Once | ORAL | 0 refills | Status: DC

## 2021-11-20 NOTE — Patient Instructions (Signed)
Medication Instructions:  Your physician has recommended you make the following change in your medication:  START: Nitroglycerin 0.4 mg take one tablet by mouth every 5 minutes up to three times as needed for chest pain.  *If you need a refill on your cardiac medications before your next appointment, please call your pharmacy*   Lab Work: Your physician recommends that you return for lab work in: Within one week of your cardiac CT  BMP If you have labs (blood work) drawn today and your tests are completely normal, you will receive your results only by: [place] (if you have MyChart) OR A paper copy in the mail If you have any lab test that is abnormal or we need to change your treatment, we will call you to review the results.   Testing/Procedures:   Your cardiac CT will be scheduled at the below location:   [facility] [address] [place], [place] [address] [phone]  If scheduled at [facility], please arrive at the [facility] main entrance (entrance A) of [facility] 30 minutes prior to test start time. You can use the FREE valet parking offered at the main entrance (encouraged to control the heart rate for the test) Proceed to the [facility] Radiology Department (first floor) to check-in and test prep.  Please follow these instructions carefully (unless otherwise directed):   On the Night Before the Test: Be sure to Drink plenty of water. Do not consume any caffeinated/decaffeinated beverages or chocolate 12 hours prior to your test. Do not take any antihistamines 12 hours prior to your test.  On the Day of the Test: Drink plenty of water until 1 hour prior to the test. Do not eat any food 4 hours prior to the test. You may take your regular medications prior to the test.  Take metoprolol (Lopressor) two hours prior to test. FEMALES- please wear underwire-free bra if available, avoid dresses & tight clothing  After the  Test: Drink plenty of water. After receiving IV contrast, you may experience a mild flushed feeling. This is normal. On occasion, you may experience a mild rash up to 24 hours after the test. This is not dangerous. If this occurs, you can take Benadryl 25 mg and increase your fluid intake. If you experience trouble breathing, this can be serious. If it is severe call 911 IMMEDIATELY. If it is mild, please call our office. If you take any of these medications: Glipizide/Metformin, Avandament, Glucavance, please do not take 48 hours after completing test unless otherwise instructed.  We will call to schedule your test 2-4 weeks out understanding that some insurance companies will need an authorization prior to the service being performed.   For non-scheduling related questions, please contact the cardiac imaging nurse navigator should you have any questions/concerns: Marchia Bond, Cardiac Imaging Nurse Navigator Gordy Clement, Cardiac Imaging Nurse Navigator [facility] Direct Office Dial: [phone]   For scheduling needs, including cancellations and rescheduling, please call [place], [phone].    Follow-Up: At [facility], you and your health needs are our priority.  As part of our continuing mission to provide you with exceptional heart care, we have created designated Provider Care Teams.  These Care Teams include your primary Cardiologist (physician) and Advanced Practice Providers (APPs -  Physician Assistants and Nurse Practitioners) who all work together to provide you with the care you need, when you need it.  We recommend signing up for the patient portal  called "MyChart".  Sign up information is provided on this After Visit Summary.  MyChart is used to connect with patients for Virtual Visits (Telemedicine).  Patients are able to view lab/test results, encounter notes, upcoming appointments, etc.  Non-urgent messages can be sent to your provider as  well.   To learn more about what you can do with MyChart, go to [email or web address].    Your next appointment:   8 week(s)  The format for your next appointment:   In Person  Provider:   Shirlee More, MD    Other Instructions

## 2021-11-21 DIAGNOSIS — L400 Psoriasis vulgaris: Secondary | ICD-10-CM | POA: Diagnosis not present

## 2021-11-22 DIAGNOSIS — H60543 Acute eczematoid otitis externa, bilateral: Secondary | ICD-10-CM | POA: Diagnosis not present

## 2021-11-22 DIAGNOSIS — K219 Gastro-esophageal reflux disease without esophagitis: Secondary | ICD-10-CM | POA: Diagnosis not present

## 2021-11-22 DIAGNOSIS — R0989 Other specified symptoms and signs involving the circulatory and respiratory systems: Secondary | ICD-10-CM | POA: Diagnosis not present

## 2021-11-26 ENCOUNTER — Other Ambulatory Visit (INDEPENDENT_AMBULATORY_CARE_PROVIDER_SITE_OTHER): Payer: Medicare Other

## 2021-11-26 DIAGNOSIS — D649 Anemia, unspecified: Secondary | ICD-10-CM | POA: Diagnosis not present

## 2021-11-26 LAB — CBC WITH DIFFERENTIAL/PLATELET
Basophils Absolute: 0 10*3/uL (ref 0.0–0.1)
Basophils Relative: 0.5 % (ref 0.0–3.0)
Eosinophils Absolute: 0.1 10*3/uL (ref 0.0–0.7)
Eosinophils Relative: 1.9 % (ref 0.0–5.0)
HCT: 36.8 % (ref 36.0–46.0)
Hemoglobin: 11.9 g/dL — ABNORMAL LOW (ref 12.0–15.0)
Lymphocytes Relative: 45.8 % (ref 12.0–46.0)
Lymphs Abs: 2.8 10*3/uL (ref 0.7–4.0)
MCHC: 32.4 g/dL (ref 30.0–36.0)
MCV: 91.6 fl (ref 78.0–100.0)
Monocytes Absolute: 0.5 10*3/uL (ref 0.1–1.0)
Monocytes Relative: 7.4 % (ref 3.0–12.0)
Neutro Abs: 2.8 10*3/uL (ref 1.4–7.7)
Neutrophils Relative %: 44.4 % (ref 43.0–77.0)
Platelets: 238 10*3/uL (ref 150.0–400.0)
RBC: 4.02 Mil/uL (ref 3.87–5.11)
RDW: 13.4 % (ref 11.5–15.5)
WBC: 6.2 10*3/uL (ref 4.0–10.5)

## 2021-11-26 LAB — IRON,TIBC AND FERRITIN PANEL
%SAT: 16 % (calc) (ref 16–45)
Ferritin: 31 ng/mL (ref 16–288)
Iron: 59 ug/dL (ref 45–160)
TIBC: 365 mcg/dL (calc) (ref 250–450)

## 2021-11-27 ENCOUNTER — Telehealth: Payer: Self-pay | Admitting: Family Medicine

## 2021-11-27 NOTE — Telephone Encounter (Signed)
Pt states she has an on going cough and drainage. Pt has no other symptoms and has not taken a recent covid test. Did advise pt to make an appointment to get medication, but she declined and stated she has been coming here too often and it is running up her insurance. She wanted to see if she could just get something prescribed to help her, please advise.   [id number] [address] [place]  [place], [place] [address]  Phone:  [phone]  Fax:  [phone]

## 2021-11-28 ENCOUNTER — Telehealth (HOSPITAL_COMMUNITY): Payer: Self-pay | Admitting: Emergency Medicine

## 2021-11-28 NOTE — Telephone Encounter (Signed)
Lvm to call back

## 2021-11-28 NOTE — Telephone Encounter (Signed)
Reminded patient to get BMP prior to CCTA appt next Tuesday. Pt states she intends to get it done tomorrow 2/2.  Pt requesting phone call closer to appt to review instructions.  Marchia Bond RN Navigator Cardiac Imaging [facility] [phone] Office  [phone] Cell

## 2021-11-29 ENCOUNTER — Encounter (HOSPITAL_COMMUNITY): Payer: Self-pay

## 2021-11-29 ENCOUNTER — Telehealth (HOSPITAL_COMMUNITY): Payer: Self-pay | Admitting: Emergency Medicine

## 2021-11-29 ENCOUNTER — Ambulatory Visit (INDEPENDENT_AMBULATORY_CARE_PROVIDER_SITE_OTHER): Payer: Medicare Other | Admitting: Family Medicine

## 2021-11-29 ENCOUNTER — Encounter: Payer: Self-pay | Admitting: Family Medicine

## 2021-11-29 ENCOUNTER — Other Ambulatory Visit (HOSPITAL_BASED_OUTPATIENT_CLINIC_OR_DEPARTMENT_OTHER): Payer: Self-pay

## 2021-11-29 DIAGNOSIS — K5900 Constipation, unspecified: Secondary | ICD-10-CM | POA: Diagnosis not present

## 2021-11-29 DIAGNOSIS — T7840XS Allergy, unspecified, sequela: Secondary | ICD-10-CM | POA: Diagnosis not present

## 2021-11-29 DIAGNOSIS — I10 Essential (primary) hypertension: Secondary | ICD-10-CM | POA: Diagnosis not present

## 2021-11-29 DIAGNOSIS — R072 Precordial pain: Secondary | ICD-10-CM

## 2021-11-29 DIAGNOSIS — U071 COVID-19: Secondary | ICD-10-CM | POA: Diagnosis not present

## 2021-11-29 DIAGNOSIS — E1165 Type 2 diabetes mellitus with hyperglycemia: Secondary | ICD-10-CM

## 2021-11-29 MED ORDER — FAMOTIDINE 40 MG PO TABS: 40.0000 mg | ORAL_TABLET | Freq: Every day | ORAL | 3 refills | Status: DC

## 2021-11-29 MED ORDER — IVABRADINE HCL 5 MG PO TABS: 15.0000 mg | ORAL_TABLET | Freq: Once | ORAL | 0 refills | Status: AC

## 2021-11-29 MED ORDER — PROMETHAZINE-DM 6.25-15 MG/5ML PO SYRP: 5.0000 mL | ORAL_SOLUTION | Freq: Three times a day (TID) | ORAL | 0 refills | Status: DC | PRN

## 2021-11-29 MED ORDER — CARESTART COVID-19 HOME TEST VI KIT
PACK | 0 refills | Status: DC
  Filled 2021-11-29: qty 2, 4d supply, fill #0

## 2021-11-29 MED ORDER — AZITHROMYCIN 250 MG PO TABS: ORAL_TABLET | ORAL | 0 refills | Status: AC

## 2021-11-29 NOTE — Patient Instructions (Addendum)
Encouraged increased hydration and fiber in diet. Daily probiotics. If bowels not moving can use MOM 2 tbls po in 4 oz of warm prune juice by mouth every 2-3 days. If no results then repeat in 4 hours with  Dulcolax suppository pr, may repeat again in 4 more hours as needed. Seek care if symptoms worsen. Consider daily Miralax and/or Dulcolax if symptoms persist.    Miralax with benefiber once to twice    Cough, Adult Coughing is a reflex that clears your throat and your airways (respiratory system). Coughing helps to heal and protect your lungs. It is normal to cough occasionally, but a cough that happens with other symptoms or lasts a long time may be a sign of a condition that needs treatment. An acute cough may only last 2-3 weeks, while a chronic cough  may last 8 or more weeks. Coughing is commonly caused by: Infection of the respiratory systemby viruses or bacteria. Breathing in substances that irritate your lungs. Allergies. Asthma. Mucus that runs down the back of your throat (postnasal drip). Smoking. Acid backing up from the stomach into the esophagus (gastroesophageal reflux). Certain medicines. Chronic lung problems. Other medical conditions such as heart failure or a blood clot in the lung (pulmonary embolism). Follow these instructions at home: Medicines Take over-the-counter and prescription medicines only as told by your health care provider. Talk with your health care provider before you take a cough suppressant medicine. Lifestyle  Avoid cigarette smoke. Do not use any products that contain nicotine or tobacco, such as cigarettes, e-cigarettes, and chewing tobacco. If you need help quitting, ask your health care provider. Drink enough fluid to keep your urine pale yellow. Avoid caffeine. Do not drink alcohol if your health care provider tells you not to drink. General instructions  Pay close attention to changes in your cough. Tell your health care provider about  them. Always cover your mouth when you cough. Avoid things that make you cough, such as perfume, candles, cleaning products, or campfire or tobacco smoke. If the air is dry, use a cool mist vaporizer or humidifier in your bedroom or your home to help loosen secretions. If your cough is worse at night, try to sleep in a semi-upright position. Rest as needed. Keep all follow-up visits as told by your health care provider. This is important. Contact a health care provider if you: Have new symptoms. Cough up pus. Have a cough that does not get better after 2-3 weeks or gets worse. Cannot control your cough with cough suppressant medicines and you are losing sleep. Have pain that gets worse or pain that is not helped with medicine. Have a fever. Have unexplained weight loss. Have night sweats. Get help right away if: You cough up blood. You have difficulty breathing. Your heartbeat is very fast. These symptoms may represent a serious problem that is an emergency. Do not wait to see if the symptoms will go away. Get medical help right away. Call your local emergency services (911 in the [place] Do not drive yourself to the hospital. Summary Coughing is a reflex that clears your throat and your airways. It is normal to cough occasionally, but a cough that happens with other symptoms or lasts a long time may be a sign of a condition that needs treatment. Take over-the-counter and prescription medicines only as told by your health care provider. Always cover your mouth when you cough. Contact a health care provider if you have new symptoms or a cough that does  not get better after 2-3 weeks or gets worse. This information is not intended to replace advice given to you by your health care provider. Make sure you discuss any questions you have with your health care provider. Document Revised: 11/02/2018 Document Reviewed: 11/02/2018 Elsevier Patient Education  [place].

## 2021-11-29 NOTE — Telephone Encounter (Signed)
Scheduled for today.

## 2021-11-29 NOTE — Telephone Encounter (Signed)
Calling to inform patient that I will be prescribing one time dose 15mg  ivabradine for her to take IN ADDITION to 100mg  metoprolol for HR control for tuesdays CCTA.  Pt approved pharm as [facility].   Marchia Bond RN Navigator Cardiac Imaging [facility] [phone] Office  [phone] Cell

## 2021-11-29 NOTE — Telephone Encounter (Signed)
Pt was returning Heidi Franklin's call, please advise.

## 2021-11-30 ENCOUNTER — Other Ambulatory Visit: Payer: Self-pay | Admitting: Family Medicine

## 2021-11-30 ENCOUNTER — Other Ambulatory Visit: Payer: Self-pay

## 2021-11-30 DIAGNOSIS — R072 Precordial pain: Secondary | ICD-10-CM

## 2021-12-01 LAB — BASIC METABOLIC PANEL
BUN/Creatinine Ratio: 12 (ref 12–28)
BUN: 12 mg/dL (ref 8–27)
CO2: 24 mmol/L (ref 20–29)
Calcium: 9.5 mg/dL (ref 8.7–10.3)
Chloride: 102 mmol/L (ref 96–106)
Creatinine, Ser: 0.98 mg/dL (ref 0.57–1.00)
Glucose: 124 mg/dL — ABNORMAL HIGH (ref 70–99)
Potassium: 3.5 mmol/L (ref 3.5–5.2)
Sodium: 141 mmol/L (ref 134–144)
eGFR: 60 mL/min/{1.73_m2} (ref >59)

## 2021-12-02 NOTE — Assessment & Plan Note (Signed)
Encouraged increased hydration and fiber in diet. Daily probiotics. If bowels not moving can use MOM 2 tbls po in 4 oz of warm prune juice by mouth every 2-3 days. If no results then repeat in 4 hours with  Dulcolax suppository pr, may repeat again in 4 more hours as needed. Seek care if symptoms worsen. Consider daily Miralax and/or Dulcolax if symptoms persist. Try daily Miralax with benefiber

## 2021-12-02 NOTE — Assessment & Plan Note (Signed)
Well controlled, no changes to meds. Encouraged heart healthy diet such as the DASH diet and exercise as tolerated.  °

## 2021-12-02 NOTE — Progress Notes (Signed)
Subjective:    Patient ID: Heidi Franklin, female    DOB: [birth year], 76 y.o.   MRN: [id number]  Chief Complaint  Patient presents with   Cough   sinus drainage    HPI Patient is in today for evaluation of numerous concerns. Her chronic cough is escalating, is affecting her ability to function and sleep. She notes sinus pressure and congestion. Notes irritation in throat and has coughing fits at times. Sometimes productive but clear. No obvious fevers or chills. She does note some mild dyspepsia and constipation. No bloody or tarry stool no abdominal pain. Denies CP/palp/SOB/H/fevers/GI or GU c/o. Taking meds as prescribed   Past Medical History:  Diagnosis Date   ALLERGIC RHINITIS    Allergy    Anxiety state, unspecified    Arthritis    Asthma    BRONCHITIS, CHRONIC    Cataract    b/l   Chronic low back pain 10/09/2016   DEPRESSION    DIABETES MELLITUS, TYPE II    GERD    HYPERLIPIDEMIA    HYPERTENSION    Pneumonia    Psoriasis     Past Surgical History:  Procedure Laterality Date   ABDOMINAL HYSTERECTOMY  1980   CARPAL TUNNEL RELEASE Right    TRIGGER FINGER RELEASE Left    Index finger    Family History  Problem Relation Age of Onset   Stroke Mother    Angina Mother    Hyperlipidemia Mother    Hypertension Mother    Heart disease Mother    Depression Mother    Diabetes Father    Hyperlipidemia Other        Parent   Hypertension Other        Parent   Diabetes Sister        x1   CAD Sister    COPD Sister        smoker   Hyperlipidemia Sister    Hypertension Sister    Diabetes Brother        x 2   Hyperlipidemia Brother    Hypertension Brother    Diabetes Brother    Hyperlipidemia Brother    Hypertension Brother    CAD Sister    Arthritis Sister    Hyperlipidemia Sister    Hypertension Sister    Colon cancer Neg Hx    Esophageal cancer Neg Hx    Rectal cancer Neg Hx    Stomach cancer Neg Hx    Pancreatic cancer Neg Hx     Social History    Socioeconomic History   Marital status: Married    Spouse name: Not on file   Number of children: 0   Years of education: 74yr[employer]   Highest education level: Not on file  Occupational History   Occupation: Retired   Tobacco Use   Smoking status: Never    Passive exposure: Never   Smokeless tobacco: Never  Vaping Use   Vaping Use: Never used  Substance and Sexual Activity   Alcohol use: No    Alcohol/week: 0.0 standard drinks   Drug use: No   Sexual activity: Not Currently  Other Topics Concern   Not on file  Social History Narrative   Married, lives with spouse-[facility] [employer]   Not employed    Drinks coffee occasional, Consumes 1 soda a day    No dietary restrictions   Social Determinants of HRadio broadcast assistantStrain: Low Risk    Difficulty of Paying  Living Expenses: Not very hard  Food Insecurity: No Food Insecurity   Worried About [place] in the Last Year: Never true   Ran Out of Food in the Last Year: Never true  Transportation Needs: No Transportation Needs   Lack of Transportation (Medical): No   Lack of Transportation (Non-Medical): No  Physical Activity: Inactive   Days of Exercise per Week: 0 days   Minutes of Exercise per Session: 0 min  Stress: Stress Concern Present   Feeling of Stress : To some extent  Social Connections: Engineer, building services of Communication with Friends and Family: More than three times a week   Frequency of Social Gatherings with Friends and Family: More than three times a week   Attends Religious Services: More than 4 times per year   Active Member of [employer] or Organizations: Yes   Attends Archivist Meetings: 1 to 4 times per year   Marital Status: Married  Human resources officer Violence: Not At Risk   Fear of Current or Ex-Partner: No   Emotionally Abused: No   Physically Abused: No   Sexually Abused: No    Outpatient Medications Prior to Visit  Medication Sig Dispense  Refill   acetaminophen (TYLENOL) 325 MG tablet Take 650 mg by mouth every 6 (six) hours as needed for mild pain or fever.     albuterol (VENTOLIN HFA) 108 (90 Base) MCG/ACT inhaler INHALE 1-2 PUFFS INTO THE LUNGS EVERY 6 (SIX) HOURS AS NEEDED FOR WHEEZING OR SHORTNESS OF BREATH. 8.5 each 3   ALPRAZolam (XANAX) 0.5 MG tablet TAKE 1 TABLET BY MOUTH 2 TIMES DAILY AS NEEDED FOR ANXIETY. 60 tablet 1   amLODipine (NORVASC) 10 MG tablet TAKE 1 TABLET BY MOUTH EVERY DAY 90 tablet 1   aspirin 81 MG chewable tablet Chew 81 mg by mouth daily.     Cholecalciferol (VITAMIN D3) 50 MCG (2000 UT) capsule Take 2,000 Units by mouth daily.     COVID-19 At Home Antigen Test [facility] COVID-19 HOME TEST) KIT Use as directed. 2 kit 0   fluticasone (CUTIVATE) 0.05 % cream APPLY TO AFFECTED AREA TWICE A DAY 1 WEEK ON 1 WEEK OFF PRN FLARE     fluticasone (FLONASE) 50 MCG/ACT nasal spray SPRAY 2 SPRAYS INTO EACH NOSTRIL EVERY DAY 48 mL 2   glimepiride (AMARYL) 1 MG tablet TAKE 1 TABLET BY MOUTH EVERY DAY WITH BREAKFAST 90 tablet 1   glucose blood (ONE TOUCH ULTRA TEST) test strip 1 each by Other route daily. Use 1 strips to check blood sugar twice a day Ex E11.9 100 each 3   HUMIRA PEN-PS/UV/ADOL HS START 40 MG/0.8ML PNKT Every 2 weeks     HYDROcodone bit-homatropine (HYCODAN) 5-1.5 MG/5ML syrup Take 5 mLs by mouth at bedtime as needed for cough. 120 mL 0   ketoconazole (NIZORAL) 2 % cream      Lancets (ONETOUCH ULTRASOFT) lancets PRN 100 each 3   losartan-hydrochlorothiazide (HYZAAR) 100-12.5 MG tablet TAKE 1 TABLET BY MOUTH EVERY DAY 90 tablet 1   mometasone (ELOCON) 0.1 % ointment      montelukast (SINGULAIR) 10 MG tablet TAKE 1 TABLET BY MOUTH EVERYDAY AT BEDTIME 90 tablet 1   Multiple Vitamins-Minerals (CENTRUM SILVER PO) Take 1 tablet by mouth every morning.     nitroGLYCERIN (NITROSTAT) 0.4 MG SL tablet Place 1 tablet (0.4 mg total) under the tongue every 5 (five) minutes as needed. 30 tablet 3   pantoprazole  (PROTONIX) 40 MG  tablet TAKE 1 TABLET (40 MG TOTAL) BY MOUTH 2 (TWO) TIMES DAILY BEFORE A MEAL. 180 tablet 0   Potassium Chloride ER 20 MEQ TBCR TAKE 1 TABLET BY MOUTH EVERY DAY 90 tablet 1   tiZANidine (ZANAFLEX) 2 MG tablet Take 0.5-2 tablets (1-4 mg total) by mouth every 8 (eight) hours as needed for muscle spasms. 40 tablet 1   valACYclovir (VALTREX) 500 MG tablet Take 500 mg by mouth 2 (two) times daily.  0   lovastatin (MEVACOR) 20 MG tablet TAKE 1 TABLET BY MOUTH EVERYDAY AT BEDTIME 90 tablet 0   betamethasone dipropionate (DIPROLENE) 0.05 % ointment Apply to affected areas twice a day and then as needed flare/itch     metoprolol tartrate (LOPRESSOR) 100 MG tablet Take 1 tablet (100 mg total) by mouth once for 1 dose. Take two hours prior to your cardiac CT 1 tablet 0   No facility-administered medications prior to visit.    Allergies  Allergen Reactions   Metformin And Related Diarrhea   Penicillins Rash    Review of Systems  Constitutional:  Positive for malaise/fatigue. Negative for fever.  HENT:  Positive for congestion and sinus pain. Negative for hearing loss and sore throat.   Eyes:  Negative for blurred vision.  Respiratory:  Positive for cough and sputum production. Negative for hemoptysis and shortness of breath.   Cardiovascular:  Negative for chest pain, palpitations, leg swelling and PND.  Gastrointestinal:  Positive for constipation and heartburn. Negative for abdominal pain, blood in stool, diarrhea, nausea and vomiting.  Genitourinary:  Negative for dysuria and frequency.  Musculoskeletal:  Negative for falls.  Skin:  Negative for rash.  Neurological:  Negative for dizziness, loss of consciousness and headaches.  Endo/Heme/Allergies:  Negative for environmental allergies.  Psychiatric/Behavioral:  Negative for depression. The patient is not nervous/anxious.       Objective:    Physical Exam Constitutional:      General: She is not in acute distress.     Appearance: She is well-developed.  HENT:     Head: Normocephalic and atraumatic.     Comments: Oropharynx erythematous Eyes:     Conjunctiva/sclera: Conjunctivae normal.  Neck:     Thyroid: No thyromegaly.  Cardiovascular:     Rate and Rhythm: Normal rate and regular rhythm.     Heart sounds: Normal heart sounds. No murmur heard. Pulmonary:     Effort: Pulmonary effort is normal. No respiratory distress.     Breath sounds: Normal breath sounds.  Abdominal:     General: Bowel sounds are normal. There is no distension.     Palpations: Abdomen is soft. There is no mass.     Tenderness: There is no abdominal tenderness.  Musculoskeletal:     Cervical back: Neck supple.  Lymphadenopathy:     Cervical: No cervical adenopathy.  Skin:    General: Skin is warm and dry.  Neurological:     Mental Status: She is alert and oriented to person, place, and time.  Psychiatric:        Behavior: Behavior normal.    BP 118/64    Pulse (!) 101    Temp 98.4 F (36.9 C)    Resp 18    Wt 158 lb 9.6 oz (71.9 kg)    SpO2 93%    BMI 30.97 kg/m  Wt Readings from Last 3 Encounters:  11/29/21 158 lb 9.6 oz (71.9 kg)  11/20/21 160 lb (72.6 kg)  10/18/21 161 lb 3.2 oz (73.1  kg)    Diabetic Foot Exam - Simple   No data filed    Lab Results  Component Value Date   WBC 6.2 11/26/2021   HGB 11.9 (L) 11/26/2021   HCT 36.8 11/26/2021   PLT 238.0 11/26/2021   GLUCOSE 124 (H) 11/30/2021   CHOL 171 09/26/2021   TRIG 133.0 09/26/2021   HDL 45.00 09/26/2021   LDLDIRECT 155.1 02/09/2013   LDLCALC 99 09/26/2021   ALT 14 10/18/2021   AST 19 10/18/2021   NA 141 11/30/2021   K 3.5 11/30/2021   CL 102 11/30/2021   CREATININE 0.98 11/30/2021   BUN 12 11/30/2021   CO2 24 11/30/2021   TSH 4.74 09/26/2021   HGBA1C 6.9 (H) 09/26/2021   MICROALBUR 1.6 08/08/2016    Lab Results  Component Value Date   TSH 4.74 09/26/2021   Lab Results  Component Value Date   WBC 6.2 11/26/2021   HGB 11.9 (L)  11/26/2021   HCT 36.8 11/26/2021   MCV 91.6 11/26/2021   PLT 238.0 11/26/2021   Lab Results  Component Value Date   NA 141 11/30/2021   K 3.5 11/30/2021   CO2 24 11/30/2021   GLUCOSE 124 (H) 11/30/2021   BUN 12 11/30/2021   CREATININE 0.98 11/30/2021   BILITOT 0.3 10/18/2021   ALKPHOS 49 10/18/2021   AST 19 10/18/2021   ALT 14 10/18/2021   PROT 7.2 10/18/2021   ALBUMIN 4.1 10/18/2021   CALCIUM 9.5 11/30/2021   ANIONGAP 7 10/14/2015   EGFR 60 11/30/2021   GFR 48.78 (L) 10/18/2021   Lab Results  Component Value Date   CHOL 171 09/26/2021   Lab Results  Component Value Date   HDL 45.00 09/26/2021   Lab Results  Component Value Date   LDLCALC 99 09/26/2021   Lab Results  Component Value Date   TRIG 133.0 09/26/2021   Lab Results  Component Value Date   CHOLHDL 4 09/26/2021   Lab Results  Component Value Date   HGBA1C 6.9 (H) 09/26/2021       Assessment & Plan:   Problem List Items Addressed This Visit     Essential hypertension    Well controlled, no changes to meds. Encouraged heart healthy diet such as the DASH diet and exercise as tolerated.       Type 2 diabetes mellitus with hyperglycemia (HCC)    hgba1c acceptable, minimize simple carbs. Increase exercise as tolerated. Continue current meds      Constipation    Encouraged increased hydration and fiber in diet. Daily probiotics. If bowels not moving can use MOM 2 tbls po in 4 oz of warm prune juice by mouth every 2-3 days. If no results then repeat in 4 hours with  Dulcolax suppository pr, may repeat again in 4 more hours as needed. Seek care if symptoms worsen. Consider daily Miralax and/or Dulcolax if symptoms persist. Try daily Miralax with benefiber      ALLERGIC RHINITIS    Daily Flonase, antihistamine, plain Mucinex       I am having [name] H. Garlock start on famotidine, promethazine-dextromethorphan, and azithromycin. I am also having her maintain her Multiple Vitamins-Minerals (CENTRUM  SILVER PO), mometasone, acetaminophen, valACYclovir, onetouch ultrasoft, glucose blood, Humira Pen-Ps/UV/Adol HS Start, betamethasone dipropionate, fluticasone, Vitamin D3, fluticasone, ketoconazole, Potassium Chloride ER, albuterol, amLODipine, montelukast, losartan-hydrochlorothiazide, ALPRAZolam, pantoprazole, tiZANidine, HYDROcodone bit-homatropine, glimepiride, nitroGLYCERIN, aspirin, metoprolol tartrate, and Carestart COVID-19 Home Test.  Meds ordered this encounter  Medications   famotidine (PEPCID) 40  MG tablet    Sig: Take 1 tablet (40 mg total) by mouth daily.    Dispense:  30 tablet    Refill:  3   promethazine-dextromethorphan (PROMETHAZINE-DM) 6.25-15 MG/5ML syrup    Sig: Take 5 mLs by mouth 3 (three) times daily as needed for cough.    Dispense:  140 mL    Refill:  0   azithromycin (ZITHROMAX) 250 MG tablet    Sig: Take 2 tablets on day 1, then 1 tablet daily on days 2 through 5    Dispense:  6 tablet    Refill:  0     Penni Homans, MD

## 2021-12-02 NOTE — Assessment & Plan Note (Signed)
Multifactorial and worsening recently. Treat infectious bronchitis with a Zpak, treat cough with Promethazine DM prn and treat reflux with Famotidine qhs. She will continue to take her allergy meds and if no improvement she will need to be seen

## 2021-12-02 NOTE — Assessment & Plan Note (Signed)
Daily Flonase, antihistamine, plain Mucinex

## 2021-12-02 NOTE — Assessment & Plan Note (Signed)
hgba1c acceptable, minimize simple carbs. Increase exercise as tolerated. Continue current meds 

## 2021-12-04 ENCOUNTER — Other Ambulatory Visit: Payer: Self-pay | Admitting: Cardiovascular Disease

## 2021-12-04 ENCOUNTER — Encounter (HOSPITAL_COMMUNITY): Payer: Self-pay

## 2021-12-04 ENCOUNTER — Ambulatory Visit (HOSPITAL_COMMUNITY)
Admission: RE | Admit: 2021-12-04 | Discharge: 2021-12-04 | Disposition: A | Discharge status: Home / Self Care | Payer: Medicare Other | Source: Ambulatory Visit | Attending: Cardiovascular Disease | Admitting: Cardiovascular Disease

## 2021-12-04 ENCOUNTER — Other Ambulatory Visit: Payer: Self-pay

## 2021-12-04 ENCOUNTER — Ambulatory Visit (HOSPITAL_COMMUNITY)
Admission: RE | Admit: 2021-12-04 | Discharge: 2021-12-04 | Disposition: A | Discharge status: Home / Self Care | Payer: Medicare Other | Source: Ambulatory Visit | Attending: Cardiology | Admitting: Cardiology

## 2021-12-04 DIAGNOSIS — R931 Abnormal findings on diagnostic imaging of heart and coronary circulation: Secondary | ICD-10-CM | POA: Diagnosis not present

## 2021-12-04 DIAGNOSIS — I2510 Atherosclerotic heart disease of native coronary artery without angina pectoris: Secondary | ICD-10-CM | POA: Diagnosis not present

## 2021-12-04 DIAGNOSIS — Z20822 Contact with and (suspected) exposure to covid-19: Secondary | ICD-10-CM | POA: Diagnosis not present

## 2021-12-04 DIAGNOSIS — R072 Precordial pain: Secondary | ICD-10-CM | POA: Insufficient documentation

## 2021-12-04 MED ORDER — NITROGLYCERIN 0.4 MG SL SUBL: 0.8000 mg | SUBLINGUAL_TABLET | Freq: Once | SUBLINGUAL | Status: AC

## 2021-12-04 MED ORDER — IOHEXOL 350 MG/ML SOLN
95.0000 mL | Freq: Once | INTRAVENOUS | Status: AC | PRN
  Administered 2021-12-04: 95 mL via INTRAVENOUS

## 2021-12-04 MED ORDER — NITROGLYCERIN 0.4 MG SL SUBL
SUBLINGUAL_TABLET | SUBLINGUAL | Status: AC
  Administered 2021-12-04: 0.8 mg via SUBLINGUAL
  Filled 2021-12-04: qty 2

## 2021-12-05 DIAGNOSIS — L400 Psoriasis vulgaris: Secondary | ICD-10-CM | POA: Diagnosis not present

## 2021-12-10 ENCOUNTER — Encounter: Payer: Self-pay | Admitting: Cardiology

## 2021-12-10 ENCOUNTER — Telehealth: Payer: Self-pay

## 2021-12-10 MED ORDER — METOPROLOL SUCCINATE ER 25 MG PO TB24: 25.0000 mg | ORAL_TABLET | Freq: Every day | ORAL | 3 refills | Status: DC

## 2021-12-10 MED ORDER — ROSUVASTATIN CALCIUM 20 MG PO TABS: 10.0000 mg | ORAL_TABLET | Freq: Every day | ORAL | 3 refills | Status: DC

## 2021-12-10 NOTE — Addendum Note (Signed)
Addended by: Truddie Hidden on: 12/10/2021 02:08 PM   Modules accepted: Orders

## 2021-12-10 NOTE — Telephone Encounter (Signed)
Patient returning call.

## 2021-12-10 NOTE — Telephone Encounter (Signed)
-----   Message from Heidi Priest, MD sent at 12/08/2021 11:29 AM EST ----- Her coronary calcium score is elevated I had like her to switch from lovastatin quite weak to rosuvastatin more effective 20 mg daily I think in the long-term will be very beneficial for her and the side effects are very involved and similar to the lovastatin.  She has plaque in the left anterior descending coronary artery moderate is not restricting blood flow and she does not need things like a heart catheterization.  I think she would benefit from taking a beta-blocker and if she agrees Toprol-XL 25 mg daily.

## 2021-12-10 NOTE — Telephone Encounter (Signed)
Left VM for pt to callback and discuss.

## 2021-12-10 NOTE — Telephone Encounter (Signed)
Results reviewed with pt as per Dr. Revankar's note.  Pt verbalized understanding and had no additional questions. Routed to PCP.  

## 2021-12-18 ENCOUNTER — Telehealth: Payer: Self-pay | Admitting: Cardiology

## 2021-12-18 DIAGNOSIS — Z20822 Contact with and (suspected) exposure to covid-19: Secondary | ICD-10-CM | POA: Diagnosis not present

## 2021-12-18 NOTE — Telephone Encounter (Signed)
Spoke to the patient just now and she let me know that she feels a little lightheaded and is having trouble with her ears. She also has some congestion/drainage. She tells me that her blood pressure this morning was 100/55, this was about an hour after taking her medications. Right now her blood pressure is 133/54 and HR is 98 bpm.   Denies any chest pain, SOB, dizziness.   I advised at this time that she call her PCP to schedule an appointment to see them but also that she go to urgent care or the ED to be evaluated if she felt that she was getting worse. She states that she will do so.

## 2021-12-18 NOTE — Telephone Encounter (Signed)
Pt c/o BP issue: STAT if pt c/o blurred vision, one-sided weakness or slurred speech  1. What are your last 5 BP readings? 98/48      100/55  2. Are you having any other symptoms (ex. Dizziness, headache, blurred vision, passed out)? Feels a little disorientated, but she is have trouble with her ears as well.   3. What is your BP issue? She is wondering if the new medication (metoprolol succinate (TOPROL XL) 25 MG 24 hr tablet) she is on is bringing it down.

## 2021-12-19 ENCOUNTER — Telehealth: Payer: Self-pay | Admitting: Family Medicine

## 2021-12-19 ENCOUNTER — Other Ambulatory Visit: Payer: Self-pay | Admitting: Internal Medicine

## 2021-12-19 DIAGNOSIS — L408 Other psoriasis: Secondary | ICD-10-CM | POA: Diagnosis not present

## 2021-12-19 DIAGNOSIS — Z79899 Other long term (current) drug therapy: Secondary | ICD-10-CM | POA: Diagnosis not present

## 2021-12-19 DIAGNOSIS — R1013 Epigastric pain: Secondary | ICD-10-CM

## 2021-12-19 DIAGNOSIS — B001 Herpesviral vesicular dermatitis: Secondary | ICD-10-CM | POA: Diagnosis not present

## 2021-12-19 NOTE — Telephone Encounter (Signed)
Patient called stating her blood pressure had been running a bit high and she spoke to her cardiologist and they advise her to see her PCP (see other chart note from cardiologist). She wants advise on what to do since she does not want to come in for an office visit since she has been to a lot of doctors recently. She was advised again to schedule an OV but she declined and wanted to know what Dr. Charlett Blake would recommend. Please advise.

## 2021-12-19 NOTE — Telephone Encounter (Signed)
Pt informed of recommendations. Pt agreed to check BP twice daily and contact us if less than 110. Advised pt to make an appointment and offered to make her one, she states she will call back tomorrow.

## 2021-12-19 NOTE — Telephone Encounter (Signed)
I reviewed notes from cardiology:  "Spoke to the patient just now and she let me know that she feels a little lightheaded and is having trouble with her ears. She also has some congestion/drainage. She tells me that her blood pressure this morning was 100/55, this was about an hour after taking her medications. Right now her blood pressure is 133/54 and HR is 98 bpm.    Denies any chest pain, SOB, dizziness.    I advised at this time that she call her PCP to schedule an appointment to see them but also that she go to urgent care or the ED to be evaluated if she felt that she was getting worse. She states that she will do so"   Please advise patient: Monitor BPs twice daily.  If they are consistently less than 110 let us know. Schedule a visit to assess lightheadedness, problems with the ears,  congestion ( URI?)

## 2021-12-27 DIAGNOSIS — H6980 Other specified disorders of Eustachian tube, unspecified ear: Secondary | ICD-10-CM | POA: Diagnosis not present

## 2021-12-27 DIAGNOSIS — H6123 Impacted cerumen, bilateral: Secondary | ICD-10-CM | POA: Diagnosis not present

## 2022-01-01 ENCOUNTER — Other Ambulatory Visit: Payer: Self-pay

## 2022-01-01 ENCOUNTER — Emergency Department (INDEPENDENT_AMBULATORY_CARE_PROVIDER_SITE_OTHER)
Admission: EM | Admit: 2022-01-01 | Discharge: 2022-01-01 | Disposition: A | Discharge status: Home / Self Care | Payer: Medicare Other | Source: Home / Self Care

## 2022-01-01 DIAGNOSIS — W57XXXA Bitten or stung by nonvenomous insect and other nonvenomous arthropods, initial encounter: Secondary | ICD-10-CM | POA: Diagnosis not present

## 2022-01-01 DIAGNOSIS — R21 Rash and other nonspecific skin eruption: Secondary | ICD-10-CM

## 2022-01-01 MED ORDER — DOXYCYCLINE HYCLATE 100 MG PO CAPS: 100.0000 mg | ORAL_CAPSULE | Freq: Two times a day (BID) | ORAL | 0 refills | Status: AC

## 2022-01-01 NOTE — ED Triage Notes (Addendum)
Pt c/o insect bite to chest x 3 days. Unsure of what type of insect. Red/bruised looking. And slightly welted. Some itching last night. Neosporin prn.  ?

## 2022-01-01 NOTE — ED Provider Notes (Signed)
[facility]    CSN: [id number] Arrival date & time: 01/01/22  [place]      History   Chief Complaint Chief Complaint  Patient presents with   Insect Bite    HPI Heidi Franklin is a 76 y.o. female.   HPI 76 year old female presents with insect bite to chest x3 days.  Patient is unsure what type of insect bit her; however, is concerned with redness, slight welting and is pruritic.  PMH significant for T2DM with hyperglycemia and HTN.  Past Medical History:  Diagnosis Date   ALLERGIC RHINITIS    Allergy    Anxiety state, unspecified    Arthritis    Asthma    BRONCHITIS, CHRONIC    Cataract    b/l   Chronic low back pain 10/09/2016   DEPRESSION    DIABETES MELLITUS, TYPE II    GERD    HYPERLIPIDEMIA    HYPERTENSION    Pneumonia    Psoriasis     Patient Active Problem List   Diagnosis Date Noted   ALLERGIC RHINITIS 11/15/2021   Arthritis 11/15/2021   Asthma 11/15/2021   BRONCHITIS, CHRONIC 11/15/2021   Constipation 10/18/2021   Type 2 diabetes mellitus with hyperglycemia ([place]) 09/24/2021   Acute bilateral knee pain 09/24/2021   Vitiligo 09/07/2021   Pedal edema 03/27/2020   Toe ulcer ([place]) 03/10/2020   Body mass index (BMI) 31.0-31.9, adult 11/08/2019   Disc displacement, lumbar 11/08/2019   Strain of lumbar paraspinal muscle 11/08/2019   History of chicken pox 04/11/2019   Vitamin D deficiency 04/05/2019   Lumbar radiculopathy 03/31/2019   Neck pain 07/06/2018   Trapezius muscle strain, right, initial encounter 06/23/2018   Edema 06/23/2018   Gout 04/08/2018   Herpes simplex 02/10/2018   ETD (eustachian tube dysfunction) 02/10/2018   Peroneal tendinitis, right 01/06/2018   Psoriasis 11/14/2017   Acute bursitis of right shoulder 09/29/2017   Bilateral hip pain 07/10/2017   Bronchiectasis without complication ([place]) 40/97/3532   Irritable larynx syndrome 04/25/2017   Reflux laryngitis 04/23/2017   Benign lipomatous neoplasm of skin and  subcutaneous tissue of right leg 02/25/2017   Anemia 02/05/2017   Fatigue 01/23/2017   Cough 01/02/2017   Multiple pulmonary nodules 11/10/2016   Chronic low back pain 10/09/2016   Degenerative arthritis of right knee 06/20/2016   Degenerative arthritis of left knee 04/10/2016   Bronchitis 12/15/2015   Peroneal tendinitis of left lower extremity 09/05/2015   Recurrent falls 08/08/2015   Bursitis of left shoulder 07/13/2015   Morbid obesity due to excess calories ([place]) 04/16/2015   Nonspecific abnormal electrocardiogram (ECG) (EKG) 05/11/2011   Hyperlipidemia associated with type 2 diabetes mellitus ([place]) 03/02/2010   Depression with anxiety 03/02/2010   Essential hypertension 03/02/2010   Chronic rhinitis 03/02/2010   Upper airway cough syndrome 03/02/2010   GERD 03/02/2010    Past Surgical History:  Procedure Laterality Date   ABDOMINAL HYSTERECTOMY  1980   CARPAL TUNNEL RELEASE Right    TRIGGER FINGER RELEASE Left    Index finger    OB History   No obstetric history on file.      Home Medications    Prior to Admission medications   Medication Sig Start Date End Date Taking? Authorizing Provider  doxycycline (VIBRAMYCIN) 100 MG capsule Take 1 capsule (100 mg total) by mouth 2 (two) times daily for 10 days. 01/01/22 01/11/22 Yes Eliezer Lofts, FNP  acetaminophen (TYLENOL) 325 MG tablet Take 650 mg by mouth every 6 (  six) hours as needed for mild pain or fever.    [provider]  albuterol (VENTOLIN HFA) 108 (90 Base) MCG/ACT inhaler INHALE 1-2 PUFFS INTO THE LUNGS EVERY 6 (SIX) HOURS AS NEEDED FOR WHEEZING OR SHORTNESS OF BREATH. 06/21/21   [name], MD  ALPRAZolam (XANAX) 0.5 MG tablet TAKE 1 TABLET BY MOUTH 2 TIMES DAILY AS NEEDED FOR ANXIETY. 08/10/21   [name], MD  amLODipine (NORVASC) 10 MG tablet TAKE 1 TABLET BY MOUTH EVERY DAY 07/05/21   [name], MD  aspirin 81 MG chewable tablet Chew 81 mg by mouth daily.    [provider]   betamethasone dipropionate (DIPROLENE) 0.05 % ointment Apply to affected areas twice a day and then as needed flare/itch 01/13/20   [provider]  Cholecalciferol (VITAMIN D3) 50 MCG (2000 UT) capsule Take 2,000 Units by mouth daily.    [provider]  COVID-19 At Home Antigen Test [facility] COVID-19 HOME TEST) KIT Use as directed. 11/29/21   Clementeen Graham, RPH  famotidine (PEPCID) 40 MG tablet Take 1 tablet (40 mg total) by mouth daily. 11/29/21   [name], MD  fluticasone (CUTIVATE) 0.05 % cream APPLY TO AFFECTED AREA TWICE A DAY 1 WEEK ON 1 WEEK OFF PRN FLARE 01/29/20   [provider]  fluticasone (FLONASE) 50 MCG/ACT nasal spray SPRAY 2 SPRAYS INTO EACH NOSTRIL EVERY DAY 01/02/21   [name], MD  glimepiride (AMARYL) 1 MG tablet TAKE 1 TABLET BY MOUTH EVERY DAY WITH BREAKFAST 11/07/21   [name], MD  glucose blood (ONE TOUCH ULTRA TEST) test strip 1 each by Other route daily. Use 1 strips to check blood sugar twice a day Ex E11.9 05/26/19   [name], MD  HUMIRA PEN-PS/UV/ADOL HS START 40 MG/0.8ML PNKT Every 2 weeks 01/27/20   [provider]  HYDROcodone bit-homatropine (HYCODAN) 5-1.5 MG/5ML syrup Take 5 mLs by mouth at bedtime as needed for cough. 09/24/21   [name], MD  ketoconazole (NIZORAL) 2 % cream     [provider]  Lancets (ONETOUCH ULTRASOFT) lancets PRN 05/26/19   [name], MD  losartan-hydrochlorothiazide [facility]) 100-12.5 MG tablet TAKE 1 TABLET BY MOUTH EVERY DAY 07/19/21   [name], MD  metoprolol succinate (TOPROL XL) 25 MG 24 hr tablet Take 1 tablet (25 mg total) by mouth daily. 12/10/21   Richardo Priest, MD  mometasone (ELOCON) 0.1 % ointment  05/16/16   [provider]  montelukast (SINGULAIR) 10 MG tablet TAKE 1 TABLET BY MOUTH EVERYDAY AT BEDTIME 07/19/21   [name], MD  Multiple Vitamins-Minerals (CENTRUM SILVER PO) Take 1 tablet by mouth every morning.    [provider]  nitroGLYCERIN (NITROSTAT) 0.4 MG SL tablet Place 1 tablet (0.4 mg total) under the tongue every 5 (five) minutes as needed. 11/20/21 02/18/22  Richardo Priest, MD  pantoprazole (PROTONIX) 40 MG tablet Take 1 tablet (40 mg total) by mouth daily. Office visit for further refills 12/19/21   Irene Shipper, MD  Potassium Chloride ER 20 MEQ TBCR TAKE 1 TABLET BY MOUTH EVERY DAY 05/10/21   [name], MD  promethazine-dextromethorphan (PROMETHAZINE-DM) 6.25-15 MG/5ML syrup Take 5 mLs by mouth 3 (three) times daily as needed for cough. 11/29/21   [name], MD  rosuvastatin (CRESTOR) 20 MG tablet Take 0.5 tablets (10 mg total) by mouth daily. 12/10/21 03/10/22  Richardo Priest, MD  tiZANidine (ZANAFLEX) 2 MG tablet Take 0.5-2 tablets (1-4 mg total) by mouth every 8 (eight) hours as needed for muscle spasms. 09/24/21   [name], MD  valACYclovir (VALTREX) 500 MG tablet Take 500 mg by mouth 2 (two) times daily. 07/30/18   [provider]    Family History Family History  Problem Relation Age of Onset   Stroke Mother    Angina Mother    Hyperlipidemia Mother    Hypertension Mother    Heart disease Mother    Depression Mother    Diabetes Father    Hyperlipidemia Other        Parent   Hypertension Other        Parent   Diabetes Sister        x1   CAD Sister    COPD Sister        smoker   Hyperlipidemia Sister    Hypertension Sister    Diabetes Brother        x 2   Hyperlipidemia Brother    Hypertension Brother    Diabetes Brother    Hyperlipidemia Brother    Hypertension Brother    CAD Sister    Arthritis Sister    Hyperlipidemia Sister    Hypertension Sister    Colon cancer Neg Hx    Esophageal cancer Neg Hx    Rectal cancer Neg Hx    Stomach cancer Neg Hx    Pancreatic cancer Neg Hx     Social History Social History   Tobacco Use   Smoking status: Never    Passive exposure: Never   Smokeless tobacco: Never  Vaping Use   Vaping Use: Never  used  Substance Use Topics   Alcohol use: No    Alcohol/week: 0.0 standard drinks   Drug use: No     Allergies   Metformin and related and Penicillins   Review of Systems Review of Systems  Skin:  Positive for rash.    Physical Exam Triage Vital Signs ED Triage Vitals  Enc Vitals Group     BP 01/01/22 1845 130/72     Pulse Rate 01/01/22 1845 91     Resp 01/01/22 1845 16     Temp 01/01/22 1845 97.9 F (36.6 C)     Temp Source 01/01/22 1845 Oral     SpO2 01/01/22 1845 100 %     Weight --      Height --      Head Circumference --      Peak Flow --      Pain Score 01/01/22 1847 0     Pain Loc --      Pain Edu? --      Excl. in [place]? --    No data found.  Updated Vital Signs BP 130/72 (BP Location: Left Arm)    Pulse 91    Temp 97.9 F (36.6 C) (Oral)    Resp 16    SpO2 100%      Physical Exam Vitals and nursing note reviewed.  Constitutional:      General: She is not in acute distress.    Appearance: Normal appearance. She is normal weight. She is not ill-appearing.  HENT:     Head: Normocephalic and atraumatic.     Mouth/Throat:     Mouth: Mucous membranes are moist.     Pharynx: Oropharynx is clear.  Eyes:     Extraocular Movements: Extraocular movements intact.     Conjunctiva/sclera: Conjunctivae  normal.     Pupils: Pupils are equal, round, and reactive to light.  Cardiovascular:     Rate and Rhythm: Normal rate and regular rhythm.     Pulses: Normal pulses.     Heart sounds: Normal heart sounds.  Pulmonary:     Effort: Pulmonary effort is normal.     Breath sounds: Normal breath sounds. No wheezing, rhonchi or rales.  Musculoskeletal:     Cervical back: Normal range of motion and neck supple. No tenderness.  Lymphadenopathy:     Cervical: No cervical adenopathy.  Skin:    General: Skin is warm and dry.     Comments: Central chest (over superior sternum body at level of 3rd ribs): ~2.0 cm x 1.0 cm circular shaped erythematous papule, mildly  indurated, mildly fluctuant, no lymphatic streaking, no discharge, no drainage.  Mildly pruritic per patient.  Neurological:     General: No focal deficit present.     Mental Status: She is alert and oriented to person, place, and time.     UC Treatments / Results  Labs (all labs ordered are listed, but only abnormal results are displayed) Labs Reviewed - No data to display  EKG   Radiology No results found.  Procedures Procedures (including critical care time)  Medications Ordered in UC Medications - No data to display  Initial Impression / Assessment and Plan / UC Course  I have reviewed the triage vital signs and the nursing notes.  Pertinent labs & imaging results that were available during my care of the patient were reviewed by me and considered in my medical decision making (see chart for details).     MDM: 1.  Bug bite with infection, initial encounter-Rx'd Doxycycline. Advised patient to take medication as directed with food to completion.  Encouraged patient to increase daily water intake while taking this medication.  Advised patient if affected area worsens and/or unresolved please follow-up with PCP or here for further evaluation.  Patient discharged home, hemodynamically stable. Final Clinical Impressions(s) / UC Diagnoses   Final diagnoses:  Bug bite with infection, initial encounter     Discharge Instructions      Advised patient to take medication as directed with food to completion.  Encouraged patient to increase daily water intake while taking this medication.  Advised patient if affected area worsens and/or unresolved please follow-up with PCP or here for further evaluation.     ED Prescriptions     Medication Sig Dispense Auth. Provider   doxycycline (VIBRAMYCIN) 100 MG capsule Take 1 capsule (100 mg total) by mouth 2 (two) times daily for 10 days. 20 capsule Eliezer Lofts, FNP      PDMP not reviewed this encounter.   Eliezer Lofts,  [place] 01/01/22 1919

## 2022-01-01 NOTE — Discharge Instructions (Addendum)
Advised patient to take medication as directed with food to completion.  Encouraged patient to increase daily water intake while taking this medication.  Advised patient if affected area worsens and/or unresolved please follow-up with PCP or here for further evaluation. ?

## 2022-01-03 ENCOUNTER — Ambulatory Visit: Payer: Medicare Other | Admitting: Sports Medicine

## 2022-01-03 DIAGNOSIS — L853 Xerosis cutis: Secondary | ICD-10-CM | POA: Diagnosis not present

## 2022-01-03 DIAGNOSIS — L400 Psoriasis vulgaris: Secondary | ICD-10-CM | POA: Diagnosis not present

## 2022-01-03 DIAGNOSIS — L309 Dermatitis, unspecified: Secondary | ICD-10-CM | POA: Diagnosis not present

## 2022-01-05 ENCOUNTER — Other Ambulatory Visit: Payer: Self-pay | Admitting: Family Medicine

## 2022-01-06 NOTE — Progress Notes (Signed)
01/07/2022 Heidi Franklin [id number] 1945/11/11   ASSESSMENT AND PLAN:   Gastroesophageal reflux disease without esophagitis Patient has no true GERD symptoms, continue PPI BID ? If actually gastroparesis, if continuing symptoms can consider repeat EGD with possible manometry/PH studies Lifestyle changes discussed, avoid NSAIDS  Regurgitation of food No real nausea, vomiting.  No dysphagia.  Patient has regurg of undigested food hours prior, likely gastroparesis, will send to dietician, given information, discussed short acting trial of reglan but we decided to not do that at this time.  No need for repeat EGD at this time unless symptoms change or do not resolve.   Gastroparesis due to DM [facility]) Had normal EGD 2017 and 08/2020, mild gastroparesis 08/2022 Likely symptoms are from gastroparesis/worsening constipation Discussed diet, referral to dietician, medication options like reglan as well as possible repeat EGD with patient but will not be pursued at this time.  Discussed diet with small, frequent meals, soft diet with the patient.  Better A1C control discussed with patient.   Chronic idiopathic constipation ? Worsening her GERD/gastroparesis Will add on miralax/fiber, if this does not help can consider amitiza/linzess Negative IDA recently, negative FOBT with PCP.   Follow up 2 months.   History of Present Illness:  76 y.o. female  with a past medical history of hypertension, hyperlipidemia, obesity, diabetes with mild gastroparesis, reflux and others listed below, known to Dr. Henrene Pastor returns to clinic today for evaluation of chest pain and constipation. Patient saw cardiology 11/20/2021 with chest pain, had elevated cardiac calcium score however had gated cardiac CTA negative for obstructive disease. 09/18/2020 mild delay in gastric emptying due to diabetes 08/28/2020 endoscopy for reflux, nausea and vomiting completely normal. 06/2016 upper endoscopy normal 2015  colonoscopy normal  CBC with normocystic anemia, iron normal at 58.   She has had 3 months of worsening esophageal/substernal feeling/pressure, nonexertional, can be in morning, had negative cardiac work up.  Denies vomiting, has had regurgitation with some nausea AFTERWARDS.  States she can eat breakfast/lunch/dinner and after lunch or dinner can have sensation of food coming up with regurgitation of lunch or dinner. Rarely happens after lunch, more often after supper can have this regurgitation.  Normally can be 3 or more hours, about 10 pm, can be worse with lying down, Recently has been happening up to an hour after food.  She has chronic cough since she was 76 years old, has hoarseness.  Breakfast eats 1 boiled egg, small oatmeal, bacon- never has an issue.  She gets full very quickly, has AB bloating.  Denies weight loss.  Sometimes she does get GERD, tries to avoid spicy foods.  She is on protonix twice a day x 2-3 months and has been on pepcid 1-2 months, and not helpings.  Sugars have been well controlled, last A1C 6.9 at 09/26/2021  She has been having constipation x 3-4 months, has been on miralax which helped but can cause leakage or diarrhea when she does.   Denies NSAIDS, ETOH.   Current Medications:   Current Outpatient Medications (Endocrine & Metabolic):    glimepiride (AMARYL) 1 MG tablet, TAKE 1 TABLET BY MOUTH EVERY DAY WITH BREAKFAST  Current Outpatient Medications (Cardiovascular):    amLODipine (NORVASC) 10 MG tablet, TAKE 1 TABLET BY MOUTH EVERY DAY   losartan-hydrochlorothiazide (HYZAAR) 100-12.5 MG tablet, TAKE 1 TABLET BY MOUTH EVERY DAY   metoprolol succinate (TOPROL XL) 25 MG 24 hr tablet, Take 1 tablet (25 mg total) by mouth daily.   nitroGLYCERIN (  NITROSTAT) 0.4 MG SL tablet, Place 1 tablet (0.4 mg total) under the tongue every 5 (five) minutes as needed.   rosuvastatin (CRESTOR) 20 MG tablet, Take 0.5 tablets (10 mg total) by mouth daily.  Current  Outpatient Medications (Respiratory):    albuterol (VENTOLIN HFA) 108 (90 Base) MCG/ACT inhaler, INHALE 1-2 PUFFS INTO THE LUNGS EVERY 6 (SIX) HOURS AS NEEDED FOR WHEEZING OR SHORTNESS OF BREATH.   HYDROcodone bit-homatropine (HYCODAN) 5-1.5 MG/5ML syrup, Take 5 mLs by mouth at bedtime as needed for cough. (Patient taking differently: Take 5 mLs by mouth at bedtime as needed for cough. As needed)   montelukast (SINGULAIR) 10 MG tablet, TAKE 1 TABLET BY MOUTH EVERYDAY AT BEDTIME   fluticasone (FLONASE) 50 MCG/ACT nasal spray, SPRAY 2 SPRAYS INTO EACH NOSTRIL EVERY DAY  Current Outpatient Medications (Analgesics):    acetaminophen (TYLENOL) 325 MG tablet, Take 650 mg by mouth every 6 (six) hours as needed for mild pain or fever.   aspirin 81 MG chewable tablet, Chew 81 mg by mouth daily.   HUMIRA PEN-PS/UV/ADOL HS START 40 MG/0.8ML PNKT, Every 2 weeks   Current Outpatient Medications (Other):    ALPRAZolam (XANAX) 0.5 MG tablet, TAKE 1 TABLET BY MOUTH 2 TIMES DAILY AS NEEDED FOR ANXIETY.   betamethasone dipropionate (DIPROLENE) 0.05 % ointment, Apply to affected areas twice a day and then as needed flare/itch   Cholecalciferol (VITAMIN D3) 50 MCG (2000 UT) capsule, Take 2,000 Units by mouth daily.   doxycycline (VIBRAMYCIN) 100 MG capsule, Take 1 capsule (100 mg total) by mouth 2 (two) times daily for 10 days.   famotidine (PEPCID) 40 MG tablet, Take 1 tablet (40 mg total) by mouth daily.   fluticasone (CUTIVATE) 0.05 % cream, APPLY TO AFFECTED AREA TWICE A DAY 1 WEEK ON 1 WEEK OFF PRN FLARE   glucose blood (ONE TOUCH ULTRA TEST) test strip, 1 each by Other route daily. Use 1 strips to check blood sugar twice a day Ex E11.9   ketoconazole (NIZORAL) 2 % cream,    Lancets (ONETOUCH ULTRASOFT) lancets, PRN   mometasone (ELOCON) 0.1 % ointment,    Multiple Vitamins-Minerals (CENTRUM SILVER PO), Take 1 tablet by mouth every morning.   pantoprazole (PROTONIX) 40 MG tablet, Take 1 tablet (40 mg total)  by mouth daily. Office visit for further refills (Patient taking differently: Take 40 mg by mouth 2 (two) times daily. Office visit for further refills)   Potassium Chloride ER 20 MEQ TBCR, TAKE 1 TABLET BY MOUTH EVERY DAY   tiZANidine (ZANAFLEX) 2 MG tablet, Take 0.5-2 tablets (1-4 mg total) by mouth every 8 (eight) hours as needed for muscle spasms.   valACYclovir (VALTREX) 500 MG tablet, Take 500 mg by mouth 2 (two) times daily.  Surgical History:  She  has a past surgical history that includes Abdominal hysterectomy (1980); Trigger finger release (Left); Carpal tunnel release (Right); Upper gastrointestinal endoscopy; and Colonoscopy. Family History:  Her family history includes Angina in her mother; Arthritis in her sister; CAD in her sister and sister; COPD in her sister; Depression in her mother; Diabetes in her brother, brother, father, and sister; Heart disease in her mother; Hyperlipidemia in her brother, brother, mother, sister, sister, and another family member; Hypertension in her brother, brother, mother, sister, sister, and another family member; Stroke in her mother. Social History:   reports that she has never smoked. She has never been exposed to tobacco smoke. She has never used smokeless tobacco. She reports that she does  not drink alcohol and does not use drugs.  Current Medications, Allergies, Past Medical History, Past Surgical History, Family History and Social History were reviewed in [employer] record.  Physical Exam: BP 102/62    Pulse 87    Ht '5\' 1"'$  (1.549 m)    Wt 155 lb 6.4 oz (70.5 kg)    BMI 29.36 kg/m  General:   Pleasant, well developed female in no acute distress Eyes: sclerae anicteric,conjunctive pink  Heart:  regular rate and rhythm Pulm: Clear anteriorly; no wheezing Abdomen:  Soft, Obese AB, skin exam normal, Normal bowel sounds.  no  tenderness . Without guarding and Without rebound, without hepatomegaly. Extremities:  Without  edema. Peripheral pulses intact.  Neurologic:  Alert and  oriented x4;  grossly normal neurologically. Skin:   Dry and intact without significant lesions or rashes. Psychiatric: Demonstrates good judgement and reason without abnormal affect or behaviors.  Vladimir Crofts, PA-C 01/07/22

## 2022-01-07 ENCOUNTER — Encounter: Payer: Self-pay | Admitting: Physician Assistant

## 2022-01-07 ENCOUNTER — Encounter: Payer: Self-pay | Admitting: Family Medicine

## 2022-01-07 ENCOUNTER — Ambulatory Visit (INDEPENDENT_AMBULATORY_CARE_PROVIDER_SITE_OTHER): Payer: Medicare Other | Admitting: Physician Assistant

## 2022-01-07 VITALS — BP 102/62 | HR 87 | Ht 61.0 in | Wt 155.4 lb

## 2022-01-07 DIAGNOSIS — E1143 Type 2 diabetes mellitus with diabetic autonomic (poly)neuropathy: Secondary | ICD-10-CM | POA: Diagnosis not present

## 2022-01-07 DIAGNOSIS — K3184 Gastroparesis: Secondary | ICD-10-CM | POA: Diagnosis not present

## 2022-01-07 DIAGNOSIS — K5904 Chronic idiopathic constipation: Secondary | ICD-10-CM | POA: Diagnosis not present

## 2022-01-07 DIAGNOSIS — R1110 Vomiting, unspecified: Secondary | ICD-10-CM | POA: Diagnosis not present

## 2022-01-07 DIAGNOSIS — K219 Gastro-esophageal reflux disease without esophagitis: Secondary | ICD-10-CM

## 2022-01-07 NOTE — Patient Instructions (Addendum)
Gastroparesis ?Please do small frequent meals like 4-6 meals a day.  ?Eat and drink liquids at separate times.  ?Avoid high fiber foods, cook your vegetables, avoid high fat food.  ?Suggest spreading protein throughout the day (greek yogurt, glucerna, soft meat, milk, eggs) ?Choose soft foods that you can mash with a fork ?When you are more symptomatic, change to pureed foods foods and liquids.  ?Consider reading "Living well with Gastroparesis" by Lambert Keto ?Reglan or metoclopramide - can try this if diet does not help.  ?Can be used for gastroparesis or slow stomach, nausea, vomiting, GERD.  ?Miralax is an osmotic laxative.  ?It only brings more water into the stool.  ?This is safe to take daily.  ?Can take up to 17 gram of miralax twice a day.  ?Mix with juice or coffee.  ?Start 1 capful at night for 3-4 days and reassess your response in 3-4 days.  ?You can increase and decrease the dose based on your response.  ?Remember, it can take up to 3-4 days to take effect OR for the effects to wear off. ?I often pair this with benefiber in the morning to help assure the stool is not too loose.  ? ?Gastroparesis is a condition in which food takes longer than normal to empty from the stomach. This condition is also known as delayed gastric emptying. It is usually a long-term (chronic) condition. ?There is no cure, but there are treatments and things that you can do at home to help relieve symptoms. Treating the underlying condition that causes gastroparesis can also help relieve symptoms ?What are the causes? ?In many cases, the cause of this condition is not known. Possible causes include: ?A hormone (endocrine) disorder, such as hypothyroidism or diabetes. ?A nervous system disease, such as Parkinson's disease or multiple sclerosis. ?Cancer, infection, or surgery that affects the stomach or vagus nerve. The vagus nerve runs from your chest, through your neck, and to the lower part of your brain. ?A connective  tissue disorder, such as scleroderma. ?Certain medicines. ?What increases the risk? ?You are more likely to develop this condition if: ?You have certain disorders or diseases. These may include: ?An endocrine disorder. ?An eating disorder. ?Amyloidosis. ?Scleroderma. ?Parkinson's disease. ?Multiple sclerosis. ?Cancer or infection of the stomach or the vagus nerve. ?You have had surgery on your stomach or vagus nerve. ?You take certain medicines. ?You are female. ?What are the signs or symptoms? ?Symptoms of this condition include: ?Feeling full after eating very little or a loss of appetite. ?Nausea, vomiting, or heartburn. ?Bloating of your abdomen. ?Inconsistent blood sugar (glucose) levels on blood tests. ?Unexplained weight loss. ?Acid from the stomach coming up into the esophagus (gastroesophageal reflux). ?Sudden tightening (spasm) of the stomach, which can be painful. ?Symptoms may come and go. Some people may not notice any symptoms. ?How is this diagnosed? ?This condition is diagnosed with tests, such as: ?Tests that check how long it takes food to move through the stomach and intestines. These tests include: ?Upper gastrointestinal (GI) series. For this test, you drink a liquid that shows up well on X-rays, and then X-rays are taken of your intestines. ?Gastric emptying scintigraphy. For this test, you eat food that contains a small amount of radioactive material, and then scans are taken. ?Wireless capsule GI monitoring system. For this test, you swallow a pill (capsule) that records information about how foods and fluid move through your stomach. ?Gastric manometry. For this test, a tube is passed down your throat and into  your stomach to measure electrical and muscular activity. ?Endoscopy. For this test, a long, thin tube with a camera and light on the end is passed down your throat and into your stomach to check for problems in your stomach lining. ?Ultrasound. This test uses sound waves to create  images of the inside of your body. This can help rule out gallbladder disease or pancreatitis as a cause of your symptoms. ?How is this treated? ?There is no cure for this condition, but treatment and home care may relieve symptoms. Treatment may include: ?Treating the underlying cause. ?Managing your symptoms by making changes to your diet and exercise habits. ?Taking medicines to control nausea and vomiting and to stimulate stomach muscles. ?Getting food through a feeding tube in the hospital. This may be done in severe cases. ?Having surgery to insert a device called a gastric electrical stimulator into your body. This device helps improve stomach emptying and control nausea and vomiting. ?Follow these instructions at home: ?Take over-the-counter and prescription medicines only as told by your health care provider. ?Follow instructions from your health care provider about eating or drinking restrictions. Your health care provider may recommend that you: ?Eat smaller meals more often. ?Eat low-fat foods. ?Eat low-fiber forms of high-fiber foods. For example, eat cooked vegetables instead of raw vegetables. ?Have only liquid foods instead of solid foods. Liquid foods are easier to digest. ?Drink enough fluid to keep your urine pale yellow. ?Exercise as often as told by your health care provider. ?Keep all follow-up visits. This is important. ?Contact a health care provider if you: ?Notice that your symptoms do not improve with treatment. ?Have new symptoms. ?Get help right away if you: ?Have severe pain in your abdomen that does not improve with treatment. ?Have nausea that is severe or does not go away. ?Vomit every time you drink fluids. ?Summary ?Gastroparesis is a long-term (chronic) condition in which food takes longer than normal to empty from the stomach. ?Symptoms include nausea, vomiting, heartburn, bloating of your abdomen, and loss of appetite. ?Eating smaller portions, low-fat foods, and low-fiber forms  of high-fiber foods may help you manage your symptoms. ?Get help right away if you have severe pain in your abdomen. ?This information is not intended to replace advice given to you by your health care provider. Make sure you discuss any questions you have with your health care provider. ?Document Revised: 02/21/2020 Document Reviewed: 02/21/2020 ?Elsevier Patient Education ? [place]. ? ?

## 2022-01-10 ENCOUNTER — Ambulatory Visit: Payer: Medicare Other | Admitting: Cardiology

## 2022-01-12 ENCOUNTER — Other Ambulatory Visit: Payer: Self-pay | Admitting: Family Medicine

## 2022-01-17 ENCOUNTER — Ambulatory Visit (INDEPENDENT_AMBULATORY_CARE_PROVIDER_SITE_OTHER): Payer: Medicare Other | Admitting: Sports Medicine

## 2022-01-17 ENCOUNTER — Other Ambulatory Visit: Payer: Self-pay

## 2022-01-17 ENCOUNTER — Encounter: Payer: Self-pay | Admitting: Sports Medicine

## 2022-01-17 DIAGNOSIS — E119 Type 2 diabetes mellitus without complications: Secondary | ICD-10-CM

## 2022-01-17 DIAGNOSIS — M79674 Pain in right toe(s): Secondary | ICD-10-CM | POA: Diagnosis not present

## 2022-01-17 DIAGNOSIS — L400 Psoriasis vulgaris: Secondary | ICD-10-CM | POA: Diagnosis not present

## 2022-01-17 DIAGNOSIS — B351 Tinea unguium: Secondary | ICD-10-CM

## 2022-01-17 DIAGNOSIS — M79675 Pain in left toe(s): Secondary | ICD-10-CM | POA: Diagnosis not present

## 2022-01-17 DIAGNOSIS — M7661 Achilles tendinitis, right leg: Secondary | ICD-10-CM

## 2022-01-17 NOTE — Progress Notes (Signed)
Subjective: ?Heidi Franklin is a 76 y.o. female patient with history of diabetes who returns to office today for nail trim.  Patient is diabetic with blood sugars not recorded this visit. Reports that she still has soreness at her right achilles.  [name], MD last visit 11/29/21 for allergies.  ? ? ?Patient Active Problem List  ? Diagnosis Date Noted  ? ALLERGIC RHINITIS 11/15/2021  ? Arthritis 11/15/2021  ? Asthma 11/15/2021  ? BRONCHITIS, CHRONIC 11/15/2021  ? Constipation 10/18/2021  ? Type 2 diabetes mellitus with hyperglycemia ([place]) 09/24/2021  ? Acute bilateral knee pain 09/24/2021  ? Vitiligo 09/07/2021  ? Pedal edema 03/27/2020  ? Toe ulcer ([name]) 03/10/2020  ? Body mass index (BMI) 31.0-31.9, adult 11/08/2019  ? Disc displacement, lumbar 11/08/2019  ? Strain of lumbar paraspinal muscle 11/08/2019  ? History of chicken pox 04/11/2019  ? Vitamin D deficiency 04/05/2019  ? Lumbar radiculopathy 03/31/2019  ? Neck pain 07/06/2018  ? Trapezius muscle strain, right, initial encounter 06/23/2018  ? Edema 06/23/2018  ? Gout 04/08/2018  ? Herpes simplex 02/10/2018  ? ETD (eustachian tube dysfunction) 02/10/2018  ? Peroneal tendinitis, right 01/06/2018  ? Psoriasis 11/14/2017  ? Acute bursitis of right shoulder 09/29/2017  ? Bilateral hip pain 07/10/2017  ? Bronchiectasis without complication ([place]) 41/58/3094  ? Irritable larynx syndrome 04/25/2017  ? Reflux laryngitis 04/23/2017  ? Benign lipomatous neoplasm of skin and subcutaneous tissue of right leg 02/25/2017  ? Anemia 02/05/2017  ? Fatigue 01/23/2017  ? Cough 01/02/2017  ? Multiple pulmonary nodules 11/10/2016  ? Chronic low back pain 10/09/2016  ? Degenerative arthritis of right knee 06/20/2016  ? Degenerative arthritis of left knee 04/10/2016  ? Bronchitis 12/15/2015  ? Peroneal tendinitis of left lower extremity 09/05/2015  ? Recurrent falls 08/08/2015  ? Bursitis of left shoulder 07/13/2015  ? Morbid obesity due to excess calories ([name]) 04/16/2015  ?  Nonspecific abnormal electrocardiogram (ECG) (EKG) 05/11/2011  ? Hyperlipidemia associated with type 2 diabetes mellitus ([name]) 03/02/2010  ? Depression with anxiety 03/02/2010  ? Essential hypertension 03/02/2010  ? Chronic rhinitis 03/02/2010  ? Upper airway cough syndrome 03/02/2010  ? GERD 03/02/2010  ? ?Current Outpatient Medications on File Prior to Visit  ?Medication Sig Dispense Refill  ? acetaminophen (TYLENOL) 325 MG tablet Take 650 mg by mouth every 6 (six) hours as needed for mild pain or fever.    ? albuterol (VENTOLIN HFA) 108 (90 Base) MCG/ACT inhaler INHALE 1-2 PUFFS INTO THE LUNGS EVERY 6 (SIX) HOURS AS NEEDED FOR WHEEZING OR SHORTNESS OF BREATH. 8.5 each 3  ? ALPRAZolam (XANAX) 0.5 MG tablet TAKE 1 TABLET BY MOUTH 2 TIMES DAILY AS NEEDED FOR ANXIETY. 60 tablet 1  ? amLODipine (NORVASC) 10 MG tablet TAKE 1 TABLET BY MOUTH EVERY DAY 90 tablet 1  ? aspirin 81 MG chewable tablet Chew 81 mg by mouth daily.    ? betamethasone dipropionate (DIPROLENE) 0.05 % ointment Apply to affected areas twice a day and then as needed flare/itch    ? Cholecalciferol (VITAMIN D3) 50 MCG (2000 UT) capsule Take 2,000 Units by mouth daily.    ? famotidine (PEPCID) 40 MG tablet Take 1 tablet (40 mg total) by mouth daily. 30 tablet 3  ? fluticasone (CUTIVATE) 0.05 % cream APPLY TO AFFECTED AREA TWICE A DAY 1 WEEK ON 1 WEEK OFF PRN FLARE    ? fluticasone (FLONASE) 50 MCG/ACT nasal spray SPRAY 2 SPRAYS INTO EACH NOSTRIL EVERY DAY 48 mL 2  ?  glimepiride (AMARYL) 1 MG tablet TAKE 1 TABLET BY MOUTH EVERY DAY WITH BREAKFAST 90 tablet 1  ? glucose blood (ONE TOUCH ULTRA TEST) test strip 1 each by Other route daily. Use 1 strips to check blood sugar twice a day Ex E11.9 100 each 3  ? HUMIRA PEN-PS/UV/ADOL HS START 40 MG/0.8ML PNKT Every 2 weeks    ? HYDROcodone bit-homatropine (HYCODAN) 5-1.5 MG/5ML syrup Take 5 mLs by mouth at bedtime as needed for cough. (Patient taking differently: Take 5 mLs by mouth at bedtime as needed for  cough. As needed) 120 mL 0  ? ketoconazole (NIZORAL) 2 % cream     ? Lancets (ONETOUCH ULTRASOFT) lancets PRN 100 each 3  ? losartan-hydrochlorothiazide (HYZAAR) 100-12.5 MG tablet TAKE 1 TABLET BY MOUTH EVERY DAY 90 tablet 1  ? metoprolol succinate (TOPROL XL) 25 MG 24 hr tablet Take 1 tablet (25 mg total) by mouth daily. 90 tablet 3  ? mometasone (ELOCON) 0.1 % ointment     ? montelukast (SINGULAIR) 10 MG tablet TAKE 1 TABLET BY MOUTH EVERYDAY AT BEDTIME 90 tablet 1  ? Multiple Vitamins-Minerals (CENTRUM SILVER PO) Take 1 tablet by mouth every morning.    ? nitroGLYCERIN (NITROSTAT) 0.4 MG SL tablet Place 1 tablet (0.4 mg total) under the tongue every 5 (five) minutes as needed. 30 tablet 3  ? pantoprazole (PROTONIX) 40 MG tablet Take 1 tablet (40 mg total) by mouth daily. Office visit for further refills (Patient taking differently: Take 40 mg by mouth 2 (two) times daily. Office visit for further refills) 90 tablet 0  ? Potassium Chloride ER 20 MEQ TBCR TAKE 1 TABLET BY MOUTH EVERY DAY 90 tablet 1  ? rosuvastatin (CRESTOR) 20 MG tablet Take 0.5 tablets (10 mg total) by mouth daily. 90 tablet 3  ? tiZANidine (ZANAFLEX) 2 MG tablet Take 0.5-2 tablets (1-4 mg total) by mouth every 8 (eight) hours as needed for muscle spasms. 40 tablet 1  ? valACYclovir (VALTREX) 500 MG tablet Take 500 mg by mouth 2 (two) times daily.  0  ? ?No current facility-administered medications on file prior to visit.  ? ?Allergies  ?Allergen Reactions  ? Metformin And Related Diarrhea  ? Penicillins Rash  ? ? ?Recent Results (from the past 2160 hour(s))  ?Fecal occult blood, imunochemical([facility])     Status: None  ? Collection Time: 10/30/21 12:54 PM  ? Specimen: Stool  ?Result Value Ref Range  ? Fecal Occult Bld Negative Negative  ?Iron, TIBC and Ferritin Panel     Status: None  ? Collection Time: 11/26/21  9:34 AM  ?Result Value Ref Range  ? Iron 59 45 - 160 mcg/dL  ? TIBC 365 250 - 450 mcg/dL (calc)  ? %SAT 16 16 - 45 % (calc)   ? Ferritin 31 16 - 288 ng/mL  ?CBC with Differential/Platelet     Status: Abnormal  ? Collection Time: 11/26/21  9:34 AM  ?Result Value Ref Range  ? WBC 6.2 4.0 - 10.5 K/uL  ? RBC 4.02 3.87 - 5.11 Mil/uL  ? Hemoglobin 11.9 (L) 12.0 - 15.0 g/dL  ? HCT 36.8 36.0 - 46.0 %  ? MCV 91.6 78.0 - 100.0 fl  ? MCHC 32.4 30.0 - 36.0 g/dL  ? RDW 13.4 11.5 - 15.5 %  ? Platelets 238.0 150.0 - 400.0 K/uL  ? Neutrophils Relative % 44.4 43.0 - 77.0 %  ? Lymphocytes Relative 45.8 12.0 - 46.0 %  ? Monocytes Relative 7.4 3.0 - 12.0 %  ?  Eosinophils Relative 1.9 0.0 - 5.0 %  ? Basophils Relative 0.5 0.0 - 3.0 %  ? Neutro Abs 2.8 1.4 - 7.7 K/uL  ? Lymphs Abs 2.8 0.7 - 4.0 K/uL  ? Monocytes Absolute 0.5 0.1 - 1.0 K/uL  ? Eosinophils Absolute 0.1 0.0 - 0.7 K/uL  ? Basophils Absolute 0.0 0.0 - 0.1 K/uL  ?Basic metabolic panel     Status: Abnormal  ? Collection Time: 11/30/21 10:54 AM  ?Result Value Ref Range  ? Glucose 124 (H) 70 - 99 mg/dL  ? BUN 12 8 - 27 mg/dL  ? Creatinine, Ser 0.98 0.57 - 1.00 mg/dL  ? eGFR 60 >59 mL/min/1.73  ? BUN/Creatinine Ratio 12 12 - 28  ? Sodium 141 134 - 144 mmol/L  ? Potassium 3.5 3.5 - 5.2 mmol/L  ? Chloride 102 96 - 106 mmol/L  ? CO2 24 20 - 29 mmol/L  ? Calcium 9.5 8.7 - 10.3 mg/dL  ? ? ?Objective: ?General: Patient is awake, alert, and oriented x 3 and in no acute distress. ? ?Integument: Skin is warm, dry and supple bilateral. Nails are tender, long, thickened and dystrophic with subungual debris, consistent with onychomycosis, 1-5 bilateral.  No signs of infection. No open lesions or preulcerative lesions present bilateral.  Discoloration on the back of the right heels from previous steroid administration.  Remaining integument unremarkable. ? ?Vasculature:  Dorsalis Pedis pulse 2/4 bilateral. Posterior Tibial pulse  1/4 bilateral.  ?Capillary fill time <3 sec 1-5 bilateral. Positive hair growth to the level of the digits. ?Temperature gradient within normal limits. No varicosities present bilateral. No  edema present bilateral.  ? ?Neurology: Intact.  Unchanged from prior. ? ?Musculoskeletal: Asymptomatic pes planus pedal deformities noted bilateral.  Muscular strength 5/5 in all lower extremity muscular g

## 2022-01-25 DIAGNOSIS — Z20822 Contact with and (suspected) exposure to covid-19: Secondary | ICD-10-CM | POA: Diagnosis not present

## 2022-01-26 ENCOUNTER — Other Ambulatory Visit: Payer: Self-pay | Admitting: Family Medicine

## 2022-01-31 DIAGNOSIS — L400 Psoriasis vulgaris: Secondary | ICD-10-CM | POA: Diagnosis not present

## 2022-02-09 ENCOUNTER — Other Ambulatory Visit: Payer: Self-pay | Admitting: Family Medicine

## 2022-02-14 DIAGNOSIS — L400 Psoriasis vulgaris: Secondary | ICD-10-CM | POA: Diagnosis not present

## 2022-02-24 ENCOUNTER — Other Ambulatory Visit: Payer: Self-pay | Admitting: Family Medicine

## 2022-02-25 NOTE — Progress Notes (Signed)
?Cardiology Office Note:   ? ?Date:  02/26/2022  ? ?ID:  Heidi Franklin, DOB 08/27/1946, MRN [id number] ? ?PCP:  [name], MD  ?Cardiologist:  Shirlee More, MD   ? ?Referring MD: [name], MD  ? ? ?ASSESSMENT:   ? ?1. Coronary artery disease of native artery of native heart with stable angina pectoris ([place])   ?2. Agatston coronary artery calcium score between 200 and 399   ?3. Type 2 diabetes mellitus with hyperglycemia, without long-term current use of insulin ([place])   ?4. Essential hypertension   ?5. Hyperlipidemia associated with type 2 diabetes mellitus ([place])   ? ?PLAN:   ? ?In order of problems listed above: ? ?Fortunately she had a cardiac CTA allowing [place] to know that she has mild nonflow limiting CAD and likely would have normal myocardial perfusion images these individuals do well with medical therapy we will continue aspirin high intensity statin beta-blocker calcium channel blocker and add oral nitrates I think her symptoms will resolve.  If not we could try ranolazine.  We discussed reducing stress in her life regular activity but she is limited in knee pain I do not think cardiac rehabilitation would be a good option ?Continue lipid-lowering therapy with a high intensity statin ?Stable managed by her PCP ?Continue current treatment including ARB calcium channel blocker ? ? ?Next appointment: I will plan to see her back in the office near ? ? ?Medication Adjustments/Labs and Tests Ordered: ?Current medicines are reviewed at length with the patient today.  Concerns regarding medicines are outlined above.  ?No orders of the defined types were placed in this encounter. ? ?No orders of the defined types were placed in this encounter. ? ? ?Follow-up after cardiac CTA ? ? ?History of Present Illness:   ? ?Heidi Franklin is a 76 y.o. female with a hx of hyperlipidemia hypertension type 2 diabetes last seen 11/20/2021 with angina pectoris.  She had previous normal myocardial perfusion study.Marland Kitchen ?Compliance  with diet, lifestyle and medications: Yes ? ?We reviewed the results of her cardiac CTA showing mild CAD with normal FFR ?Overall is doing well just at times she has vague chest tightness without activity relieved with rest ?She is under intense personal stress. ?She is on a good medical regimen including aspirin or high intensity statin beta-blocker calcium channel blocker in order to try low-dose oral nitrate to see if we can resolve symptoms ?She is reassured ?No edema shortness of breath palpitation or syncope ? ?She underwent cardiac CTA 01/01/2022 coronary calcium score was quite elevated 267/83rd percentile and she had moderate CAD noted in the left anterior descending and ramus branch.  Stenosis left anterior descending coronary artery 50 to 69% mid vessel ramus branch 25 to 49% ostial first diagonal less than 25% left circumflex less than 25% right coronary artery less than 25% stenosis ?Past Medical History:  ?Diagnosis Date  ? ALLERGIC RHINITIS   ? Allergy   ? Anxiety state, unspecified   ? Arthritis   ? Asthma   ? BRONCHITIS, CHRONIC   ? Cataract   ? b/l  ? Chronic low back pain 10/09/2016  ? DEPRESSION   ? DIABETES MELLITUS, TYPE II   ? GERD   ? HYPERLIPIDEMIA   ? HYPERTENSION   ? Pneumonia   ? Psoriasis   ? ? ?Past Surgical History:  ?Procedure Laterality Date  ? ABDOMINAL HYSTERECTOMY  1980  ? CARPAL TUNNEL RELEASE Right   ? COLONOSCOPY    ?  TRIGGER FINGER RELEASE Left   ? Index finger  ? UPPER GASTROINTESTINAL ENDOSCOPY    ? ? ?Current Medications: ?Current Meds  ?Medication Sig  ? acetaminophen (TYLENOL) 325 MG tablet Take 650 mg by mouth every 6 (six) hours as needed for mild pain or fever.  ? albuterol (VENTOLIN HFA) 108 (90 Base) MCG/ACT inhaler INHALE 1-2 PUFFS INTO THE LUNGS EVERY 6 (SIX) HOURS AS NEEDED FOR WHEEZING OR SHORTNESS OF BREATH.  ? ALPRAZolam (XANAX) 0.5 MG tablet TAKE 1 TABLET BY MOUTH 2 TIMES DAILY AS NEEDED FOR ANXIETY.  ? amLODipine (NORVASC) 10 MG tablet TAKE 1 TABLET BY MOUTH  EVERY DAY  ? aspirin 81 MG chewable tablet Chew 81 mg by mouth daily.  ? betamethasone dipropionate (DIPROLENE) 0.05 % ointment Apply to affected areas twice a day and then as needed flare/itch  ? Cholecalciferol (VITAMIN D3) 50 MCG (2000 UT) capsule Take 2,000 Units by mouth daily.  ? famotidine (PEPCID) 40 MG tablet TAKE 1 TABLET BY MOUTH EVERY DAY  ? fluticasone (CUTIVATE) 0.05 % cream APPLY TO AFFECTED AREA TWICE A DAY 1 WEEK ON 1 WEEK OFF PRN FLARE  ? fluticasone (FLONASE) 50 MCG/ACT nasal spray SPRAY 2 SPRAYS INTO EACH NOSTRIL EVERY DAY  ? glimepiride (AMARYL) 1 MG tablet TAKE 1 TABLET BY MOUTH EVERY DAY WITH BREAKFAST  ? glucose blood (ONE TOUCH ULTRA TEST) test strip 1 each by Other route daily. Use 1 strips to check blood sugar twice a day Ex E11.9  ? HUMIRA PEN-PS/UV/ADOL HS START 40 MG/0.8ML PNKT Every 2 weeks  ? HYDROcodone bit-homatropine (HYCODAN) 5-1.5 MG/5ML syrup Take 5 mLs by mouth at bedtime as needed for cough. (Patient taking differently: Take 5 mLs by mouth at bedtime as needed for cough. As needed)  ? ketoconazole (NIZORAL) 2 % cream   ? Lancets (ONETOUCH ULTRASOFT) lancets PRN  ? losartan-hydrochlorothiazide (HYZAAR) 100-12.5 MG tablet TAKE 1 TABLET BY MOUTH EVERY DAY  ? metoprolol succinate (TOPROL XL) 25 MG 24 hr tablet Take 1 tablet (25 mg total) by mouth daily.  ? mometasone (ELOCON) 0.1 % ointment   ? montelukast (SINGULAIR) 10 MG tablet TAKE 1 TABLET BY MOUTH EVERYDAY AT BEDTIME  ? Multiple Vitamins-Minerals (CENTRUM SILVER PO) Take 1 tablet by mouth every morning.  ? nitroGLYCERIN (NITROSTAT) 0.4 MG SL tablet Place 1 tablet (0.4 mg total) under the tongue every 5 (five) minutes as needed.  ? pantoprazole (PROTONIX) 40 MG tablet Take 1 tablet (40 mg total) by mouth daily. Office visit for further refills (Patient taking differently: Take 40 mg by mouth 2 (two) times daily. Office visit for further refills)  ? Potassium Chloride ER 20 MEQ TBCR TAKE 1 TABLET BY MOUTH EVERY DAY  ?  rosuvastatin (CRESTOR) 20 MG tablet Take 0.5 tablets (10 mg total) by mouth daily.  ? tiZANidine (ZANAFLEX) 2 MG tablet Take 0.5-2 tablets (1-4 mg total) by mouth every 8 (eight) hours as needed for muscle spasms.  ? valACYclovir (VALTREX) 500 MG tablet Take 500 mg by mouth 2 (two) times daily.  ?  ? ?Allergies:   Metformin and related and Penicillins  ? ?Social History  ? ?Socioeconomic History  ? Marital status: Married  ?  Spouse name: Not on file  ? Number of children: 0  ? Years of education: 67yr[employer]  ? Highest education level: Not on file  ?Occupational History  ? Occupation: Retired   ?Tobacco Use  ? Smoking status: Never  ?  Passive exposure: Never  ?  Smokeless tobacco: Never  ?Vaping Use  ? Vaping Use: Never used  ?Substance and Sexual Activity  ? Alcohol use: No  ?  Alcohol/week: 0.0 standard drinks  ? Drug use: No  ? Sexual activity: Not Currently  ?Other Topics Concern  ? Not on file  ?Social History Narrative  ? Married, lives with spouse-retired from [employer] insurance  ? Not employed   ? Drinks coffee occasional, Consumes 1 soda a day   ? No dietary restrictions  ? ?Social Determinants of Health  ? ?Financial Resource Strain: Low Risk   ? Difficulty of Paying Living Expenses: Not very hard  ?Food Insecurity: No Food Insecurity  ? Worried About Charity fundraiser in the Last Year: Never true  ? Ran Out of Food in the Last Year: Never true  ?Transportation Needs: No Transportation Needs  ? Lack of Transportation (Medical): No  ? Lack of Transportation (Non-Medical): No  ?Physical Activity: Inactive  ? Days of Exercise per Week: 0 days  ? Minutes of Exercise per Session: 0 min  ?Stress: Stress Concern Present  ? Feeling of Stress : To some extent  ?Social Connections: Socially Integrated  ? Frequency of Communication with Friends and Family: More than three times a week  ? Frequency of Social Gatherings with Friends and Family: More than three times a week  ? Attends Religious Services: More than 4 times  per year  ? Active Member of Clubs or Organizations: Yes  ? Attends Archivist Meetings: 1 to 4 times per year  ? Marital Status: Married  ?  ? ?Family History: ?The patient's family history include

## 2022-02-26 ENCOUNTER — Ambulatory Visit (INDEPENDENT_AMBULATORY_CARE_PROVIDER_SITE_OTHER): Payer: Medicare Other | Admitting: Cardiology

## 2022-02-26 ENCOUNTER — Encounter: Payer: Self-pay | Admitting: Cardiology

## 2022-02-26 ENCOUNTER — Ambulatory Visit: Payer: Medicare Other | Admitting: Registered"

## 2022-02-26 VITALS — BP 122/58 | HR 86 | Ht 61.0 in | Wt 158.0 lb

## 2022-02-26 DIAGNOSIS — E1169 Type 2 diabetes mellitus with other specified complication: Secondary | ICD-10-CM

## 2022-02-26 DIAGNOSIS — E1165 Type 2 diabetes mellitus with hyperglycemia: Secondary | ICD-10-CM | POA: Diagnosis not present

## 2022-02-26 DIAGNOSIS — I25118 Atherosclerotic heart disease of native coronary artery with other forms of angina pectoris: Secondary | ICD-10-CM | POA: Diagnosis not present

## 2022-02-26 DIAGNOSIS — I10 Essential (primary) hypertension: Secondary | ICD-10-CM | POA: Diagnosis not present

## 2022-02-26 DIAGNOSIS — E785 Hyperlipidemia, unspecified: Secondary | ICD-10-CM | POA: Diagnosis not present

## 2022-02-26 DIAGNOSIS — R931 Abnormal findings on diagnostic imaging of heart and coronary circulation: Secondary | ICD-10-CM | POA: Diagnosis not present

## 2022-02-26 MED ORDER — ISOSORBIDE MONONITRATE ER 30 MG PO TB24: 30.0000 mg | ORAL_TABLET | Freq: Every day | ORAL | 3 refills | Status: DC

## 2022-02-26 NOTE — Addendum Note (Signed)
Addended by: Edwyna Shell I on: 02/26/2022 05:00 PM ? ? Modules accepted: Orders ? ?

## 2022-02-26 NOTE — Patient Instructions (Signed)
Medication Instructions:  ?Your physician has recommended you make the following change in your medication:  ? ?START: Imdur 30 mg daily ? ?*If you need a refill on your cardiac medications before your next appointment, please call your pharmacy* ? ? ?Lab Work: ?None ?If you have labs (blood work) drawn today and your tests are completely normal, you will receive your results only by: ?MyChart Message (if you have MyChart) OR ?A paper copy in the mail ?If you have any lab test that is abnormal or we need to change your treatment, we will call you to review the results. ? ? ?Testing/Procedures: ?None ? ? ?Follow-Up: ?At [facility], you and your health needs are our priority.  As part of our continuing mission to provide you with exceptional heart care, we have created designated Provider Care Teams.  These Care Teams include your primary Cardiologist (physician) and Advanced Practice Providers (APPs -  Physician Assistants and Nurse Practitioners) who all work together to provide you with the care you need, when you need it. ? ?We recommend signing up for the patient portal called "MyChart".  Sign up information is provided on this After Visit Summary.  MyChart is used to connect with patients for Virtual Visits (Telemedicine).  Patients are able to view lab/test results, encounter notes, upcoming appointments, etc.  Non-urgent messages can be sent to your provider as well.   ?To learn more about what you can do with MyChart, go to [email or web address].   ? ?Your next appointment:   ?1 year(s) ? ?The format for your next appointment:   ?In Person ? ?Provider:   ?Shirlee More, MD  ? ? ?Other Instructions ?None ? ?Important Information About Sugar ? ? ? ? ? ? ?

## 2022-02-27 ENCOUNTER — Other Ambulatory Visit: Payer: Self-pay | Admitting: Family Medicine

## 2022-02-27 ENCOUNTER — Ambulatory Visit: Payer: Self-pay

## 2022-02-27 ENCOUNTER — Ambulatory Visit (INDEPENDENT_AMBULATORY_CARE_PROVIDER_SITE_OTHER): Payer: Medicare Other | Admitting: Family Medicine

## 2022-02-27 VITALS — BP 124/62 | HR 74 | Ht 61.0 in | Wt 160.0 lb

## 2022-02-27 DIAGNOSIS — I25118 Atherosclerotic heart disease of native coronary artery with other forms of angina pectoris: Secondary | ICD-10-CM

## 2022-02-27 DIAGNOSIS — M7661 Achilles tendinitis, right leg: Secondary | ICD-10-CM

## 2022-02-27 DIAGNOSIS — G8929 Other chronic pain: Secondary | ICD-10-CM | POA: Diagnosis not present

## 2022-02-27 DIAGNOSIS — M1711 Unilateral primary osteoarthritis, right knee: Secondary | ICD-10-CM

## 2022-02-27 DIAGNOSIS — M25561 Pain in right knee: Secondary | ICD-10-CM | POA: Diagnosis not present

## 2022-02-27 NOTE — Patient Instructions (Addendum)
Thank you for coming in today.  ? ?Please complete the exercises that the athletic trainer went over with you:  View at [email or web address] using code: [id number] ? ?You received an injection today. Seek immediate medical attention if the joint becomes red, extremely painful, or is oozing fluid.  ? ?Recheck back in in 6 weeks ?

## 2022-02-27 NOTE — Progress Notes (Signed)
? ?I, Peterson Lombard, LAT, ATC acting as a scribe for Lynne Leader, MD. ? ?Subjective:   ? ?CC: Bilat knee pain ? ?HPI: Pt is a 76 y/o female c/o bilat knee pain, R>L. Pt was previously seen by Dr. Georgina Snell on 06/22/20 for bilat hand/wrist pain and trigger fingers. Of note, pt was seen at the [facility] on 09/22/21 c/o bilat knee pain after suffering a fall. Today, pt c/o bilat knee pain x /. Pt locates pain in right knee is worse than the left. The right heel is also hurting. Has seen [facility] a while ago. Was given injection and that helped, but has been bothersome the past couple of months. The knees have been getting worse since her fall and feels like its giving way. Topical gels seem to help take the edge off. ? ?Knee swelling: ?Mechanical symptoms: ?Aggravates: ?Treatments tried: ? ?Dx imaging: 09/22/21 R & L knee XR ? 04/10/16 L knee XR ? ?Pertinent review of Systems: No fevers or chills ? ?Relevant historical information: Hypertension and diabetes ? ? ?Objective:   ? ?Vitals:  ? 02/27/22 1451  ?BP: 124/62  ?Pulse: 74  ?SpO2: 97%  ? ?General: Well Developed, well nourished, and in no acute distress.  ? ?MSK: Right knee: Mild effusion otherwise normal-appearing ?Normal motion with crepitation. ?Tender palpation medial joint line. ?Stable ligamentous exam. ?Positive McMurray test. ?Intact strength some pain with extension. ? ?Left knee: Normal-appearing. ?Normal motion with crepitation. ?Nontender. ?Stable ligamentous exam. ?Intact strength. ? ?Right foot and ankle: Slight swelling posterior Achilles tendon insertion. ?Normal foot and ankle motion. ?Tender palpation at distal Achilles tendon insertion on the calcaneus. ?Intact strength. ? ?Lab and Radiology Results ? ?Diagnostic Limited MSK Ultrasound of: Right Achilles tendon ?Achilles tendon is normal-appearing until insertion on the calcaneus or calcific changes present.  Appearance is consistent with chronic calcific tendinopathy.  No visible tear is  present. ?Impression: Chronic calcific tendinopathy right Achilles tendon. ? ? ?Procedure: Real-time Ultrasound Guided Injection of right knee superior lateral patellar space ?Device: Philips Affiniti 50G ?Images permanently stored and available for review in PACS ?Verbal informed consent obtained.  Discussed risks and benefits of procedure. Warned about infection, bleeding, hyperglycemia damage to structures among others. ?Patient expresses understanding and agreement ?Time-out conducted.   ?Noted no overlying erythema, induration, or other signs of local infection.   ?Skin prepped in a sterile fashion.   ?Local anesthesia: Topical Ethyl chloride.   ?With sterile technique and under real time ultrasound guidance: 40 mg of Kenalog and 2 mL of Marcaine injected into the knee joint. Fluid seen entering the joint capsule.   ?Completed without difficulty   ?Pain immediately resolved suggesting accurate placement of the medication.   ?Advised to call if fevers/chills, erythema, induration, drainage, or persistent bleeding.   ?Images permanently stored and available for review in the ultrasound unit.  ?Impression: Technically successful ultrasound guided injection. ? ? ?EXAM: ?RIGHT KNEE 3 VIEWS ?  ?COMPARISON:  None ?  ?FINDINGS: ?No recent fracture or dislocation is seen. There is no significant ?effusion. Degenerative changes are noted with bony spurs in medial, ?lateral and patellofemoral compartments. Chondrocalcinosis is seen ?in the lateral compartment. ?  ?IMPRESSION: ?No recent fracture is seen. Degenerative changes are noted in the ?right knee. ?  ?  ?Electronically Signed ?  By: Elmer Picker M.D. ?  On: 09/22/2021 15:00 ?   ? ?  ?EXAM: ?LEFT KNEE 3 VIEWS ?  ?COMPARISON:  None. ?  ?FINDINGS: ?No display fracture or  dislocation is seen. There is minimal ?cortical angulation without definite break in the cortical margin ?along the articular surface of lateral condyle of the distal femur ?which may be  related to degenerative arthritis or residual change ?from previous injury. Degenerative changes are noted with bony spurs ?in medial, lateral and patellofemoral compartments, more so in the ?medial compartment. Chondrocalcinosis is seen in the medial ?compartment. There is no significant effusion. ?  ?IMPRESSION: ?No recent displaced fracture or dislocation is seen in the left ?knee. Degenerative changes are noted, more so in the medial ?compartment. ?  ?There is minimal cortical undulation in the articular surface of ?lateral condyle of the distal left femur without break in the ?cortical margins. This may be related to degenerative arthritis or ?residual change from previous injury. If the symptoms persist, ?follow-up radiographic examination along with MRI as warranted may ?be considered. ?  ?  ?Electronically Signed ?  By: Elmer Picker M.D. ?  On: 09/22/2021 14:59 ?I, Lynne Leader, personally (independently) visualized and performed the interpretation of the images attached in this note. ? ? ?Impression and Recommendations:   ? ?Assessment and Plan: ?76 y.o. female with  ?Bilateral knee pain right worse than left.  Knee pain thought to be due to exacerbation of DJD.  Plan for steroid injection right knee.  Also recommend Voltaren gel.  Recheck in 6 weeks. ? ?Right Achilles tendinitis.  She already has a heel lift recommended by podiatry which I think is a great idea.  We will add eccentric Alfredson's exercises as well.  Continue Voltaren gel.  Recheck in 6 weeks. ? ?PDMP not reviewed this encounter. ?Orders Placed This Encounter  ?Procedures  ? [place] LIMITED JOINT SPACE STRUCTURES LOW RIGHT(NO LINKED CHARGES)  ?  Order Specific Question:   Reason for Exam (SYMPTOM  OR DIAGNOSIS REQUIRED)  ?  Answer:   knee pain  ?  Order Specific Question:   Preferred imaging location?  ?  Answer:   [place]  ? [place] LIMITED JOINT SPACE STRUCTURES LOW RIGHT(NO LINKED CHARGES)  ?  Order Specific  Question:   Reason for Exam (SYMPTOM  OR DIAGNOSIS REQUIRED)  ?  Answer:   knee and foot  ?  Order Specific Question:   Preferred imaging location?  ?  Answer:   [place]  ? ?No orders of the defined types were placed in this encounter. ? ? ?Discussed warning signs or symptoms. Please see discharge instructions. Patient expresses understanding. ? ? ?The above documentation has been reviewed and is accurate and complete Lynne Leader, M.D. ? ?

## 2022-02-28 DIAGNOSIS — L400 Psoriasis vulgaris: Secondary | ICD-10-CM | POA: Diagnosis not present

## 2022-03-04 ENCOUNTER — Telehealth: Payer: Self-pay | Admitting: Family Medicine

## 2022-03-04 NOTE — Telephone Encounter (Signed)
Patient states she is having a chronic cough again and would like medication for it. She was advised to make an OV but she refused and just wanted a message sent. Please advise.  ?

## 2022-03-06 ENCOUNTER — Other Ambulatory Visit: Payer: Self-pay

## 2022-03-06 ENCOUNTER — Ambulatory Visit (INDEPENDENT_AMBULATORY_CARE_PROVIDER_SITE_OTHER): Payer: Medicare Other | Admitting: Medical

## 2022-03-06 VITALS — BP 120/70 | HR 88 | Temp 98.2°F | Resp 18 | Ht 61.0 in | Wt 154.6 lb

## 2022-03-06 DIAGNOSIS — T7840XS Allergy, unspecified, sequela: Secondary | ICD-10-CM | POA: Diagnosis not present

## 2022-03-06 DIAGNOSIS — I25118 Atherosclerotic heart disease of native coronary artery with other forms of angina pectoris: Secondary | ICD-10-CM

## 2022-03-06 DIAGNOSIS — K219 Gastro-esophageal reflux disease without esophagitis: Secondary | ICD-10-CM | POA: Diagnosis not present

## 2022-03-06 DIAGNOSIS — R059 Cough, unspecified: Secondary | ICD-10-CM | POA: Diagnosis not present

## 2022-03-06 MED ORDER — HYDROCODONE BIT-HOMATROP MBR 5-1.5 MG/5ML PO SOLN: 5.0000 mL | Freq: Three times a day (TID) | ORAL | 0 refills | Status: DC | PRN

## 2022-03-06 MED ORDER — PROMETHAZINE-DM 6.25-15 MG/5ML PO SYRP: 5.0000 mL | ORAL_SOLUTION | Freq: Three times a day (TID) | ORAL | 0 refills | Status: DC | PRN

## 2022-03-06 MED ORDER — AZITHROMYCIN 250 MG PO TABS: ORAL_TABLET | ORAL | 0 refills | Status: AC

## 2022-03-06 MED ORDER — LEVOCETIRIZINE DIHYDROCHLORIDE 5 MG PO TABS: 5.0000 mg | ORAL_TABLET | Freq: Every evening | ORAL | 0 refills | Status: DC

## 2022-03-06 NOTE — Addendum Note (Signed)
Addended by: Anabel Halon on: 03/06/2022 04:27 PM ? ? Modules accepted: Orders ? ?

## 2022-03-06 NOTE — Patient Instructions (Signed)
Chronic cough with recent excacerabtion. Allergic rhinitis, gerd and bronchitis presently contributing to cough. ? ?Continue monelukast and flonase. Will add on xyzal anthihistamine. ? ?For gerd contine pantaprozaole and famotadine daily. ? ?For bronchtis rx azithromycin antibiotic. ? ?For severe cough rx hycodan. Rx adisement given ? ?If cough worsens despite the above  then will get cxr.  ? ?Follow up in 10-14 days or sooner if needed. ? ? ? ? ?

## 2022-03-06 NOTE — Telephone Encounter (Signed)
Called pt. Pt sounds very congested and also complains of chills, body aches. Pt has history of  chronic cough and asthma. Advised pt that she should be seen, patient agreed to come in for an appointment. Scheduled same day with Percell Miller.  ?

## 2022-03-06 NOTE — Telephone Encounter (Signed)
Pt would to know if something else can be sent in as she already has that rx. She would like a call when this is done. Please advise.  ? ?[id number] [place] ? [address] [id number] ?Phone:  [phone] Fax:  [phone] ?

## 2022-03-06 NOTE — Progress Notes (Signed)
? ?Subjective:  ? ? Patient ID: Heidi Franklin, female    DOB: 1946/08/03, 76 y.o.   MRN: [id number] ? ?HPI ?Pt in stats she has struggle with chronic mild occasional intermittent cough for years. But recently she had constant cough for past week. Some st and chest congestion.  ? ?Over past week cough becoming productive. ? ?Pt does not smoker and never did smoke. ? ?Pt states year round has cough. In past ent and allergist considered cough was from allergies. But also states one MD thought maybe reflux factor in cough. She is pantoprazole. Today when coughing bring some food up. Also famotadine. ? ?For allergies on flonae and montelukast.  ? ?Pt states various cough meds tried in the past did not help much.   ? ?Review of Systems  ?Constitutional:  Negative for chills, fatigue and fever.  ?HENT:  Positive for congestion and postnasal drip. Negative for drooling, hearing loss and nosebleeds.   ?Respiratory:  Negative for cough, chest tightness, wheezing and stridor.   ?Cardiovascular:  Negative for chest pain and palpitations.  ? ? ?Past Medical History:  ?Diagnosis Date  ? ALLERGIC RHINITIS   ? Allergy   ? Anxiety state, unspecified   ? Arthritis   ? Asthma   ? BRONCHITIS, CHRONIC   ? Cataract   ? b/l  ? Chronic low back pain 10/09/2016  ? DEPRESSION   ? DIABETES MELLITUS, TYPE II   ? GERD   ? HYPERLIPIDEMIA   ? HYPERTENSION   ? Pneumonia   ? Psoriasis   ? ?  ?Social History  ? ?Socioeconomic History  ? Marital status: Married  ?  Spouse name: Not on file  ? Number of children: 0  ? Years of education: 70yrcolge  ? Highest education level: Not on file  ?Occupational History  ? Occupation: Retired   ?Tobacco Use  ? Smoking status: Never  ?  Passive exposure: Never  ? Smokeless tobacco: Never  ?Vaping Use  ? Vaping Use: Never used  ?Substance and Sexual Activity  ? Alcohol use: No  ?  Alcohol/week: 0.0 standard drinks  ? Drug use: No  ? Sexual activity: Not Currently  ?Other Topics Concern  ? Not on file  ?Social  History Narrative  ? Married, lives with spouse-retired from [employer]  ? Not employed   ? Drinks coffee occasional, Consumes 1 soda a day   ? No dietary restrictions  ? ?Social Determinants of Health  ? ?Financial Resource Strain: Low Risk   ? Difficulty of Paying Living Expenses: Not very hard  ?Food Insecurity: No Food Insecurity  ? Worried About RCharity fundraiserin the Last Year: Never true  ? Ran Out of Food in the Last Year: Never true  ?Transportation Needs: No Transportation Needs  ? Lack of Transportation (Medical): No  ? Lack of Transportation (Non-Medical): No  ?Physical Activity: Inactive  ? Days of Exercise per Week: 0 days  ? Minutes of Exercise per Session: 0 min  ?Stress: Stress Concern Present  ? Feeling of Stress : To some extent  ?Social Connections: Socially Integrated  ? Frequency of Communication with Friends and Family: More than three times a week  ? Frequency of Social Gatherings with Friends and Family: More than three times a week  ? Attends Religious Services: More than 4 times per year  ? Active Member of Clubs or Organizations: Yes  ? Attends CArchivistMeetings: 1 to 4 times per year  ?  Marital Status: Married  ?Intimate Partner Violence: Not At Risk  ? Fear of Current or Ex-Partner: No  ? Emotionally Abused: No  ? Physically Abused: No  ? Sexually Abused: No  ? ? ?Past Surgical History:  ?Procedure Laterality Date  ? ABDOMINAL HYSTERECTOMY  1980  ? CARPAL TUNNEL RELEASE Right   ? COLONOSCOPY    ? TRIGGER FINGER RELEASE Left   ? Index finger  ? UPPER GASTROINTESTINAL ENDOSCOPY    ? ? ?Family History  ?Problem Relation Age of Onset  ? Stroke Mother   ? Angina Mother   ? Hyperlipidemia Mother   ? Hypertension Mother   ? Heart disease Mother   ? Depression Mother   ? Diabetes Father   ? Hyperlipidemia Other   ?     Parent  ? Hypertension Other   ?     Parent  ? Diabetes Sister   ?     x1  ? CAD Sister   ? COPD Sister   ?     smoker  ? Hyperlipidemia Sister   ?  Hypertension Sister   ? Diabetes Brother   ?     x 2  ? Hyperlipidemia Brother   ? Hypertension Brother   ? Diabetes Brother   ? Hyperlipidemia Brother   ? Hypertension Brother   ? CAD Sister   ? Arthritis Sister   ? Hyperlipidemia Sister   ? Hypertension Sister   ? Colon cancer Neg Hx   ? Esophageal cancer Neg Hx   ? Rectal cancer Neg Hx   ? Stomach cancer Neg Hx   ? Pancreatic cancer Neg Hx   ? ? ?Allergies  ?Allergen Reactions  ? Metformin And Related Diarrhea  ? Penicillins Rash  ? ? ?Current Outpatient Medications on File Prior to Visit  ?Medication Sig Dispense Refill  ? acetaminophen (TYLENOL) 325 MG tablet Take 650 mg by mouth every 6 (six) hours as needed for mild pain or fever.    ? albuterol (VENTOLIN HFA) 108 (90 Base) MCG/ACT inhaler INHALE 1-2 PUFFS INTO THE LUNGS EVERY 6 (SIX) HOURS AS NEEDED FOR WHEEZING OR SHORTNESS OF BREATH. 8.5 each 3  ? ALPRAZolam (XANAX) 0.5 MG tablet TAKE 1 TABLET BY MOUTH 2 TIMES DAILY AS NEEDED FOR ANXIETY. 60 tablet 1  ? amLODipine (NORVASC) 10 MG tablet TAKE 1 TABLET BY MOUTH EVERY DAY 90 tablet 1  ? aspirin 81 MG chewable tablet Chew 81 mg by mouth daily.    ? betamethasone dipropionate (DIPROLENE) 0.05 % ointment Apply to affected areas twice a day and then as needed flare/itch    ? Cholecalciferol (VITAMIN D3) 50 MCG (2000 UT) capsule Take 2,000 Units by mouth daily.    ? famotidine (PEPCID) 40 MG tablet TAKE 1 TABLET BY MOUTH EVERY DAY 90 tablet 1  ? fluticasone (CUTIVATE) 0.05 % cream APPLY TO AFFECTED AREA TWICE A DAY 1 WEEK ON 1 WEEK OFF PRN FLARE    ? fluticasone (FLONASE) 50 MCG/ACT nasal spray SPRAY 2 SPRAYS INTO EACH NOSTRIL EVERY DAY 48 mL 2  ? glimepiride (AMARYL) 1 MG tablet TAKE 1 TABLET BY MOUTH EVERY DAY WITH BREAKFAST 90 tablet 1  ? glucose blood (ONE TOUCH ULTRA TEST) test strip 1 each by Other route daily. Use 1 strips to check blood sugar twice a day Ex E11.9 100 each 3  ? HUMIRA PEN-PS/UV/ADOL HS START 40 MG/0.8ML PNKT Every 2 weeks    ? HYDROcodone  bit-homatropine (HYCODAN) 5-1.5 MG/5ML syrup Take  5 mLs by mouth at bedtime as needed for cough. (Patient taking differently: Take 5 mLs by mouth at bedtime as needed for cough. As needed) 120 mL 0  ? isosorbide mononitrate (IMDUR) 30 MG 24 hr tablet Take 1 tablet (30 mg total) by mouth daily. 90 tablet 3  ? ketoconazole (NIZORAL) 2 % cream     ? Lancets (ONETOUCH ULTRASOFT) lancets PRN 100 each 3  ? losartan-hydrochlorothiazide (HYZAAR) 100-12.5 MG tablet TAKE 1 TABLET BY MOUTH EVERY DAY 90 tablet 1  ? metoprolol succinate (TOPROL XL) 25 MG 24 hr tablet Take 1 tablet (25 mg total) by mouth daily. 90 tablet 3  ? mometasone (ELOCON) 0.1 % ointment     ? montelukast (SINGULAIR) 10 MG tablet TAKE 1 TABLET BY MOUTH EVERYDAY AT BEDTIME 90 tablet 1  ? Multiple Vitamins-Minerals (CENTRUM SILVER PO) Take 1 tablet by mouth every morning.    ? pantoprazole (PROTONIX) 40 MG tablet Take 1 tablet (40 mg total) by mouth daily. Office visit for further refills (Patient taking differently: Take 40 mg by mouth 2 (two) times daily. Office visit for further refills) 90 tablet 0  ? Potassium Chloride ER 20 MEQ TBCR TAKE 1 TABLET BY MOUTH EVERY DAY 90 tablet 1  ? promethazine-dextromethorphan (PROMETHAZINE-DM) 6.25-15 MG/5ML syrup Take 5 mLs by mouth 3 (three) times daily as needed for cough. 240 mL 0  ? rosuvastatin (CRESTOR) 20 MG tablet Take 0.5 tablets (10 mg total) by mouth daily. 90 tablet 3  ? tiZANidine (ZANAFLEX) 2 MG tablet Take 0.5-2 tablets (1-4 mg total) by mouth every 8 (eight) hours as needed for muscle spasms. 40 tablet 1  ? valACYclovir (VALTREX) 500 MG tablet Take 500 mg by mouth 2 (two) times daily.  0  ? nitroGLYCERIN (NITROSTAT) 0.4 MG SL tablet Place 1 tablet (0.4 mg total) under the tongue every 5 (five) minutes as needed. 30 tablet 3  ? ?No current facility-administered medications on file prior to visit.  ? ? ?BP 120/70   Pulse 88   Temp 98.2 ?F (36.8 ?C)   Resp 18   Ht '5\' 1"'$  (1.549 m)   Wt 154 lb 9.6 oz  (70.1 kg)   SpO2 100%   BMI 29.21 kg/m?  ?  ?   ?Objective:  ? Physical Exam ? ?General ?Mental Status- Alert. General Appearance- Not in acute distress.  ? ?Skin ?General: Color- Normal Color. Moisture- Normal Moisture. ?

## 2022-03-06 NOTE — Telephone Encounter (Signed)
[facility] ?

## 2022-03-11 ENCOUNTER — Telehealth: Payer: Self-pay | Admitting: Family Medicine

## 2022-03-11 MED ORDER — METHYLPREDNISOLONE 4 MG PO TABS: ORAL_TABLET | ORAL | 0 refills | Status: DC

## 2022-03-11 NOTE — Telephone Encounter (Signed)
Pt would like to know if she can get prednisone prescribed as well. She saw Percell Miller last week, please advise.  ?

## 2022-03-11 NOTE — Addendum Note (Signed)
Addended by: Anabel Halon on: 03/11/2022 06:50 PM ? ? Modules accepted: Orders ? ?

## 2022-03-12 NOTE — Telephone Encounter (Signed)
Pt.notified

## 2022-03-14 DIAGNOSIS — L400 Psoriasis vulgaris: Secondary | ICD-10-CM | POA: Diagnosis not present

## 2022-03-18 ENCOUNTER — Other Ambulatory Visit: Payer: Self-pay | Admitting: Internal Medicine

## 2022-03-18 DIAGNOSIS — R1013 Epigastric pain: Secondary | ICD-10-CM

## 2022-03-20 ENCOUNTER — Ambulatory Visit: Payer: Medicare Other | Admitting: Medical

## 2022-03-22 ENCOUNTER — Other Ambulatory Visit: Payer: Self-pay | Admitting: Family Medicine

## 2022-03-26 ENCOUNTER — Ambulatory Visit (INDEPENDENT_AMBULATORY_CARE_PROVIDER_SITE_OTHER): Payer: Medicare Other | Admitting: Family Medicine

## 2022-03-26 ENCOUNTER — Encounter: Payer: Self-pay | Admitting: Family Medicine

## 2022-03-26 VITALS — BP 118/60 | HR 74 | Resp 20 | Ht 61.0 in | Wt 154.8 lb

## 2022-03-26 DIAGNOSIS — E559 Vitamin D deficiency, unspecified: Secondary | ICD-10-CM

## 2022-03-26 DIAGNOSIS — I25118 Atherosclerotic heart disease of native coronary artery with other forms of angina pectoris: Secondary | ICD-10-CM | POA: Diagnosis not present

## 2022-03-26 DIAGNOSIS — K219 Gastro-esophageal reflux disease without esophagitis: Secondary | ICD-10-CM

## 2022-03-26 DIAGNOSIS — E785 Hyperlipidemia, unspecified: Secondary | ICD-10-CM

## 2022-03-26 DIAGNOSIS — E1169 Type 2 diabetes mellitus with other specified complication: Secondary | ICD-10-CM | POA: Diagnosis not present

## 2022-03-26 DIAGNOSIS — E1165 Type 2 diabetes mellitus with hyperglycemia: Secondary | ICD-10-CM | POA: Diagnosis not present

## 2022-03-26 DIAGNOSIS — R058 Other specified cough: Secondary | ICD-10-CM

## 2022-03-26 DIAGNOSIS — K5900 Constipation, unspecified: Secondary | ICD-10-CM | POA: Diagnosis not present

## 2022-03-26 DIAGNOSIS — I10 Essential (primary) hypertension: Secondary | ICD-10-CM

## 2022-03-26 LAB — CBC
HCT: 33.8 % — ABNORMAL LOW (ref 36.0–46.0)
Hemoglobin: 11.1 g/dL — ABNORMAL LOW (ref 12.0–15.0)
MCHC: 32.9 g/dL (ref 30.0–36.0)
MCV: 93.2 fl (ref 78.0–100.0)
Platelets: 200 10*3/uL (ref 150.0–400.0)
RBC: 3.62 Mil/uL — ABNORMAL LOW (ref 3.87–5.11)
RDW: 15 % (ref 11.5–15.5)
WBC: 10.2 10*3/uL (ref 4.0–10.5)

## 2022-03-26 LAB — COMPREHENSIVE METABOLIC PANEL
ALT: 30 U/L (ref 0–35)
AST: 24 U/L (ref 0–37)
Albumin: 3.7 g/dL (ref 3.5–5.2)
Alkaline Phosphatase: 55 U/L (ref 39–117)
BUN: 20 mg/dL (ref 6–23)
CO2: 28 mEq/L (ref 19–32)
Calcium: 9.5 mg/dL (ref 8.4–10.5)
Chloride: 108 mEq/L (ref 96–112)
Creatinine, Ser: 0.87 mg/dL (ref 0.40–1.20)
GFR: 64.44 mL/min (ref >60.00)
Glucose, Bld: 94 mg/dL (ref 70–99)
Potassium: 4.2 mEq/L (ref 3.5–5.1)
Sodium: 143 mEq/L (ref 135–145)
Total Bilirubin: 0.3 mg/dL (ref 0.2–1.2)
Total Protein: 6.6 g/dL (ref 6.0–8.3)

## 2022-03-26 LAB — MICROALBUMIN / CREATININE URINE RATIO
Creatinine,U: 180.3 mg/dL
Microalb Creat Ratio: 0.7 mg/g (ref 0.0–30.0)
Microalb, Ur: 1.3 mg/dL (ref 0.0–1.9)

## 2022-03-26 LAB — LIPID PANEL
Cholesterol: 159 mg/dL (ref 0–200)
HDL: 46.8 mg/dL (ref >39.00)
LDL Cholesterol: 100 mg/dL — ABNORMAL HIGH (ref 0–99)
NonHDL: 111.96
Total CHOL/HDL Ratio: 3
Triglycerides: 62 mg/dL (ref 0.0–149.0)
VLDL: 12.4 mg/dL (ref 0.0–40.0)

## 2022-03-26 LAB — TSH: TSH: 3.95 u[IU]/mL (ref 0.35–5.50)

## 2022-03-26 LAB — HEMOGLOBIN A1C: Hgb A1c MFr Bld: 6.7 % — ABNORMAL HIGH (ref 4.6–6.5)

## 2022-03-26 LAB — VITAMIN D 25 HYDROXY (VIT D DEFICIENCY, FRACTURES): VITD: 71.38 ng/mL (ref 30.00–100.00)

## 2022-03-26 MED ORDER — METHYLPREDNISOLONE 4 MG PO TABS: ORAL_TABLET | ORAL | 0 refills | Status: DC

## 2022-03-26 NOTE — Patient Instructions (Signed)

## 2022-03-26 NOTE — Assessment & Plan Note (Addendum)
Has been seen by ENT and Pulmonary and no obvious cause has been found. Doing better right now after a course of Prednisone. Given a prescription for ,edrol to use prn. Consider stopping Losartan if returns and increasing HCTZ and metoprolol

## 2022-03-26 NOTE — Assessment & Plan Note (Signed)
Doing well on added fiber supplement. No changes

## 2022-03-26 NOTE — Assessment & Plan Note (Signed)
Tolerating statin, encouraged heart healthy diet, avoid trans fats, minimize simple carbs and saturated fats. Increase exercise as tolerated 

## 2022-03-26 NOTE — Progress Notes (Signed)
Subjective:   By signing my name below, I, Zite Okoli, attest that this documentation has been prepared under the direction and in the presence of  [name], MD. 03/26/2022    Patient ID: Heidi Franklin, female    DOB: 09-18-1946, 76 y.o.   MRN: [id number]  Chief Complaint  Patient presents with   Follow-up    HPI Patient is in today for an office visit and f/u  She reports the methylprednisolone tablets she was given at her last visit have helped to relieve the cough. The cough is still present but not as worse as before. It is accompanied with allergy symptoms in her eyes. She adds the cough is intermittent at baseline.   She has seen a pulmonologist and ENT for evaluation of the cough.  She often has to use benefiber to assist her bowel movements and it helps greatly.   Past Medical History:  Diagnosis Date   ALLERGIC RHINITIS    Allergy    Anxiety state, unspecified    Arthritis    Asthma    BRONCHITIS, CHRONIC    Cataract    b/l   Chronic low back pain 10/09/2016   DEPRESSION    DIABETES MELLITUS, TYPE II    GERD    HYPERLIPIDEMIA    HYPERTENSION    Pneumonia    Psoriasis     Past Surgical History:  Procedure Laterality Date   ABDOMINAL HYSTERECTOMY  1980   CARPAL TUNNEL RELEASE Right    COLONOSCOPY     TRIGGER FINGER RELEASE Left    Index finger   UPPER GASTROINTESTINAL ENDOSCOPY      Family History  Problem Relation Age of Onset   Stroke Mother    Angina Mother    Hyperlipidemia Mother    Hypertension Mother    Heart disease Mother    Depression Mother    Diabetes Father    Hyperlipidemia Other        Parent   Hypertension Other        Parent   Diabetes Sister        x1   CAD Sister    COPD Sister        smoker   Hyperlipidemia Sister    Hypertension Sister    Diabetes Brother        x 2   Hyperlipidemia Brother    Hypertension Brother    Diabetes Brother    Hyperlipidemia Brother    Hypertension Brother    CAD Sister     Arthritis Sister    Hyperlipidemia Sister    Hypertension Sister    Colon cancer Neg Hx    Esophageal cancer Neg Hx    Rectal cancer Neg Hx    Stomach cancer Neg Hx    Pancreatic cancer Neg Hx     Social History   Socioeconomic History   Marital status: Married    Spouse name: Not on file   Number of children: 0   Years of education: [employer]yr[employer]   Highest education level: Not on file  Occupational History   Occupation: Retired   Tobacco Use   Smoking status: Never    Passive exposure: Never   Smokeless tobacco: Never  Vaping Use   Vaping Use: Never used  Substance and Sexual Activity   Alcohol use: No    Alcohol/week: 0.0 standard drinks   Drug use: No   Sexual activity: Not Currently  Other Topics Concern   Not on  file  Social History Narrative   Married, lives with spouse-retired from [employer] insurance   Not employed    Drinks coffee occasional, Consumes 1 soda a day    No dietary restrictions   Social Determinants of Radio broadcast assistant Strain: Low Risk    Difficulty of Paying Living Expenses: Not very hard  Food Insecurity: No Food Insecurity   Worried About Charity fundraiser in the Last Year: Never true   Arboriculturist in the Last Year: Never true  Transportation Needs: No Transportation Needs   Lack of Transportation (Medical): No   Lack of Transportation (Non-Medical): No  Physical Activity: Inactive   Days of Exercise per Week: 0 days   Minutes of Exercise per Session: 0 min  Stress: Stress Concern Present   Feeling of Stress : To some extent  Social Connections: Engineer, building services of Communication with Friends and Family: More than three times a week   Frequency of Social Gatherings with Friends and Family: More than three times a week   Attends Religious Services: More than 4 times per year   Active Member of [employer] or Organizations: Yes   Attends Archivist Meetings: 1 to 4 times per year   Marital Status: Married   Human resources officer Violence: Not At Risk   Fear of Current or Ex-Partner: No   Emotionally Abused: No   Physically Abused: No   Sexually Abused: No    Outpatient Medications Prior to Visit  Medication Sig Dispense Refill   acetaminophen (TYLENOL) 325 MG tablet Take 650 mg by mouth every 6 (six) hours as needed for mild pain or fever.     albuterol (VENTOLIN HFA) 108 (90 Base) MCG/ACT inhaler INHALE 1-2 PUFFS INTO THE LUNGS EVERY 6 (SIX) HOURS AS NEEDED FOR WHEEZING OR SHORTNESS OF BREATH. 8.5 each 3   ALPRAZolam (XANAX) 0.5 MG tablet TAKE 1 TABLET BY MOUTH 2 TIMES DAILY AS NEEDED FOR ANXIETY. 60 tablet 1   amLODipine (NORVASC) 10 MG tablet TAKE 1 TABLET BY MOUTH EVERY DAY 90 tablet 1   aspirin 81 MG chewable tablet Chew 81 mg by mouth daily.     betamethasone dipropionate (DIPROLENE) 0.05 % ointment Apply to affected areas twice a day and then as needed flare/itch     Cholecalciferol (VITAMIN D3) 50 MCG (2000 UT) capsule Take 2,000 Units by mouth daily.     famotidine (PEPCID) 40 MG tablet TAKE 1 TABLET BY MOUTH EVERY DAY 90 tablet 1   fluticasone (CUTIVATE) 0.05 % cream APPLY TO AFFECTED AREA TWICE A DAY 1 WEEK ON 1 WEEK OFF PRN FLARE     fluticasone (FLONASE) 50 MCG/ACT nasal spray SPRAY 2 SPRAYS INTO EACH NOSTRIL EVERY DAY 48 mL 2   glimepiride (AMARYL) 1 MG tablet TAKE 1 TABLET BY MOUTH EVERY DAY WITH BREAKFAST 90 tablet 1   glucose blood (ONE TOUCH ULTRA TEST) test strip 1 each by Other route daily. Use 1 strips to check blood sugar twice a day Ex E11.9 100 each 3   HUMIRA PEN-PS/UV/ADOL HS START 40 MG/0.8ML PNKT Every 2 weeks     HYDROcodone bit-homatropine (HYCODAN) 5-1.5 MG/5ML syrup Take 5 mLs by mouth at bedtime as needed for cough. (Patient taking differently: Take 5 mLs by mouth at bedtime as needed for cough. As needed) 120 mL 0   HYDROcodone bit-homatropine (HYCODAN) 5-1.5 MG/5ML syrup Take 5 mLs by mouth every 8 (eight) hours as needed for cough. 120 mL  0   isosorbide  mononitrate (IMDUR) 30 MG 24 hr tablet Take 1 tablet (30 mg total) by mouth daily. 90 tablet 3   ketoconazole (NIZORAL) 2 % cream      Lancets (ONETOUCH ULTRASOFT) lancets PRN 100 each 3   levocetirizine (XYZAL) 5 MG tablet Take 1 tablet (5 mg total) by mouth every evening. 30 tablet 0   losartan-hydrochlorothiazide (HYZAAR) 100-12.5 MG tablet TAKE 1 TABLET BY MOUTH EVERY DAY 90 tablet 1   methylPREDNISolone (MEDROL) 4 MG tablet 4 tab po day 1, 3 tab po day 2, 2 tab po day 3 and 1 tab po day 4 10 tablet 0   metoprolol succinate (TOPROL XL) 25 MG 24 hr tablet Take 1 tablet (25 mg total) by mouth daily. 90 tablet 3   mometasone (ELOCON) 0.1 % ointment      montelukast (SINGULAIR) 10 MG tablet TAKE 1 TABLET BY MOUTH EVERYDAY AT BEDTIME 90 tablet 1   Multiple Vitamins-Minerals (CENTRUM SILVER PO) Take 1 tablet by mouth every morning.     pantoprazole (PROTONIX) 40 MG tablet Take 1 tablet (40 mg total) by mouth daily. Office visit for further refills (Patient taking differently: Take 40 mg by mouth 2 (two) times daily. Office visit for further refills) 90 tablet 0   Potassium Chloride ER 20 MEQ TBCR TAKE 1 TABLET BY MOUTH EVERY DAY 90 tablet 1   tiZANidine (ZANAFLEX) 2 MG tablet Take 0.5-2 tablets (1-4 mg total) by mouth every 8 (eight) hours as needed for muscle spasms. 40 tablet 1   valACYclovir (VALTREX) 500 MG tablet Take 500 mg by mouth 2 (two) times daily.  0   nitroGLYCERIN (NITROSTAT) 0.4 MG SL tablet Place 1 tablet (0.4 mg total) under the tongue every 5 (five) minutes as needed. 30 tablet 3   rosuvastatin (CRESTOR) 20 MG tablet Take 0.5 tablets (10 mg total) by mouth daily. 90 tablet 3   No facility-administered medications prior to visit.    Allergies  Allergen Reactions   Metformin And Related Diarrhea   Penicillins Rash    Review of Systems  Constitutional:  Negative for fever and malaise/fatigue.  HENT:  Negative for congestion.   Eyes:  Negative for redness.  Respiratory:   Positive for cough. Negative for shortness of breath.   Cardiovascular:  Negative for chest pain, palpitations and leg swelling.  Gastrointestinal:  Negative for abdominal pain, blood in stool and nausea.  Genitourinary:  Negative for dysuria and frequency.  Musculoskeletal:  Negative for falls.  Skin:  Negative for rash.  Neurological:  Negative for dizziness, loss of consciousness and headaches.  Endo/Heme/Allergies:  Positive for environmental allergies. Negative for polydipsia.  Psychiatric/Behavioral:  Negative for depression. The patient is not nervous/anxious.       Objective:    Physical Exam Constitutional:      General: She is not in acute distress.    Appearance: She is well-developed.  HENT:     Head: Normocephalic and atraumatic.     Mouth/Throat:     Pharynx: Posterior oropharyngeal erythema present.  Eyes:     Conjunctiva/sclera: Conjunctivae normal.  Neck:     Thyroid: No thyromegaly.  Cardiovascular:     Rate and Rhythm: Normal rate and regular rhythm.     Heart sounds: Normal heart sounds. No murmur heard. Pulmonary:     Effort: Pulmonary effort is normal. No respiratory distress.     Breath sounds: Normal breath sounds.  Abdominal:     General: Bowel sounds are  normal. There is no distension.     Palpations: Abdomen is soft. There is no mass.     Tenderness: There is no abdominal tenderness.  Musculoskeletal:     Cervical back: Neck supple.  Lymphadenopathy:     Cervical: No cervical adenopathy.  Skin:    General: Skin is warm and dry.  Neurological:     Mental Status: She is alert and oriented to person, place, and time.  Psychiatric:        Behavior: Behavior normal.    BP 118/60 (BP Location: Left Arm, Patient Position: Sitting, Cuff Size: Normal)   Pulse 74   Resp 20   Ht _0  (1.549 m)   Wt 154 lb 12.8 oz (70.2 kg)   SpO2 99%   BMI 29.25 kg/m  Wt Readings from Last 3 Encounters:  03/26/22 154 lb 12.8 oz (70.2 kg)  03/06/22 154 lb 9.6  oz (70.1 kg)  02/27/22 160 lb (72.6 kg)    Diabetic Foot Exam - Simple   No data filed    Lab Results  Component Value Date   WBC 6.2 11/26/2021   HGB 11.9 (L) 11/26/2021   HCT 36.8 11/26/2021   PLT 238.0 11/26/2021   GLUCOSE 124 (H) 11/30/2021   CHOL 171 09/26/2021   TRIG 133.0 09/26/2021   HDL 45.00 09/26/2021   LDLDIRECT 155.1 02/09/2013   LDLCALC 99 09/26/2021   ALT 14 10/18/2021   AST 19 10/18/2021   NA 141 11/30/2021   K 3.5 11/30/2021   CL 102 11/30/2021   CREATININE 0.98 11/30/2021   BUN 12 11/30/2021   CO2 24 11/30/2021   TSH 4.74 09/26/2021   HGBA1C 6.9 (H) 09/26/2021   MICROALBUR 1.6 08/08/2016    Lab Results  Component Value Date   TSH 4.74 09/26/2021   Lab Results  Component Value Date   WBC 6.2 11/26/2021   HGB 11.9 (L) 11/26/2021   HCT 36.8 11/26/2021   MCV 91.6 11/26/2021   PLT 238.0 11/26/2021   Lab Results  Component Value Date   NA 141 11/30/2021   K 3.5 11/30/2021   CO2 24 11/30/2021   GLUCOSE 124 (H) 11/30/2021   BUN 12 11/30/2021   CREATININE 0.98 11/30/2021   BILITOT 0.3 10/18/2021   ALKPHOS 49 10/18/2021   AST 19 10/18/2021   ALT 14 10/18/2021   PROT 7.2 10/18/2021   ALBUMIN 4.1 10/18/2021   CALCIUM 9.5 11/30/2021   ANIONGAP 7 10/14/2015   EGFR 60 11/30/2021   GFR 48.78 (L) 10/18/2021   Lab Results  Component Value Date   CHOL 171 09/26/2021   Lab Results  Component Value Date   HDL 45.00 09/26/2021   Lab Results  Component Value Date   LDLCALC 99 09/26/2021   Lab Results  Component Value Date   TRIG 133.0 09/26/2021   Lab Results  Component Value Date   CHOLHDL 4 09/26/2021   Lab Results  Component Value Date   HGBA1C 6.9 (H) 09/26/2021       Assessment & Plan:   Problem List Items Addressed This Visit     Hyperlipidemia associated with type 2 diabetes mellitus ([place])    Tolerating statin, encouraged heart healthy diet, avoid trans fats, minimize simple carbs and saturated fats. Increase exercise as  tolerated       Relevant Orders   Lipid panel   Essential hypertension    Well controlled, no changes to meds. Encouraged heart healthy diet such as the DASH diet and  exercise as tolerated.        Relevant Orders   TSH   Lipid panel   Comprehensive metabolic panel   CBC   Upper airway cough syndrome    Has been seen by ENT and Pulmonary and no obvious cause has been found. Doing better right now after a course of Prednisone. Given a prescription for ,edrol to use prn. Consider stopping Losartan if returns and increasing HCTZ and metoprolol       GERD    Avoid offending foods, start probiotics. Do not eat large meals in late evening and consider raising head of bed.        Vitamin D deficiency    Supplement and monitor       Relevant Orders   VITAMIN D 25 Hydroxy (Vit-D Deficiency, Fractures)   Type 2 diabetes mellitus with hyperglycemia ([place]) - Primary    hgba1c acceptable, minimize simple carbs. Increase exercise as tolerated. Continue current meds       Relevant Orders   Hemoglobin A1c   Microalbumin / creatinine urine ratio   Constipation    Doing well on added fiber supplement. No changes        Meds ordered this encounter  Medications   methylPREDNISolone (MEDROL) 4 MG tablet    Sig: 6 tabs po x 1 day 5 tabs po x 1 day then 4 tabs po x 1 day then 3 tabs po x 1 day then 2 tabs po x 1 day then 1 tab po x 1 day and stop    Dispense:  21 tablet    Refill:  0    I,Zite Okoli,acting as a scribe for Penni Homans, MD.,have documented all relevant documentation on the behalf of Penni Homans, MD,as directed by  Penni Homans, MD while in the presence of Penni Homans, MD.   I, [name], MD., personally preformed the services described in this documentation.  All medical record entries made by the scribe were at my direction and in my presence.  I have reviewed the chart and discharge instructions (if applicable) and agree that the record reflects my personal  performance and is accurate and complete. 03/26/2022

## 2022-03-26 NOTE — Assessment & Plan Note (Signed)
hgba1c acceptable, minimize simple carbs. Increase exercise as tolerated. Continue current meds 

## 2022-03-26 NOTE — Assessment & Plan Note (Signed)
Supplement and monitor 

## 2022-03-26 NOTE — Assessment & Plan Note (Signed)
Avoid offending foods, start probiotics. Do not eat large meals in late evening and consider raising head of bed.  

## 2022-03-26 NOTE — Assessment & Plan Note (Signed)
Well controlled, no changes to meds. Encouraged heart healthy diet such as the DASH diet and exercise as tolerated.  °

## 2022-03-28 ENCOUNTER — Other Ambulatory Visit: Payer: Self-pay | Admitting: Medical

## 2022-03-28 DIAGNOSIS — L400 Psoriasis vulgaris: Secondary | ICD-10-CM | POA: Diagnosis not present

## 2022-04-10 ENCOUNTER — Other Ambulatory Visit: Payer: Self-pay | Admitting: Internal Medicine

## 2022-04-10 ENCOUNTER — Ambulatory Visit: Payer: Medicare Other | Admitting: Family Medicine

## 2022-04-10 ENCOUNTER — Other Ambulatory Visit: Payer: Self-pay | Admitting: Family Medicine

## 2022-04-10 DIAGNOSIS — R1013 Epigastric pain: Secondary | ICD-10-CM

## 2022-04-11 DIAGNOSIS — Z1231 Encounter for screening mammogram for malignant neoplasm of breast: Secondary | ICD-10-CM | POA: Diagnosis not present

## 2022-04-11 DIAGNOSIS — L400 Psoriasis vulgaris: Secondary | ICD-10-CM | POA: Diagnosis not present

## 2022-04-11 LAB — HM MAMMOGRAPHY

## 2022-04-22 ENCOUNTER — Encounter: Payer: Self-pay | Admitting: Podiatry

## 2022-04-22 ENCOUNTER — Ambulatory Visit (INDEPENDENT_AMBULATORY_CARE_PROVIDER_SITE_OTHER): Payer: Medicare Other | Admitting: Podiatry

## 2022-04-22 DIAGNOSIS — B351 Tinea unguium: Secondary | ICD-10-CM

## 2022-04-22 DIAGNOSIS — E119 Type 2 diabetes mellitus without complications: Secondary | ICD-10-CM | POA: Diagnosis not present

## 2022-04-22 DIAGNOSIS — M2141 Flat foot [pes planus] (acquired), right foot: Secondary | ICD-10-CM | POA: Diagnosis not present

## 2022-04-22 DIAGNOSIS — M79674 Pain in right toe(s): Secondary | ICD-10-CM | POA: Diagnosis not present

## 2022-04-22 DIAGNOSIS — M2012 Hallux valgus (acquired), left foot: Secondary | ICD-10-CM

## 2022-04-22 DIAGNOSIS — M2011 Hallux valgus (acquired), right foot: Secondary | ICD-10-CM | POA: Diagnosis not present

## 2022-04-22 DIAGNOSIS — M79675 Pain in left toe(s): Secondary | ICD-10-CM | POA: Diagnosis not present

## 2022-04-22 DIAGNOSIS — M2142 Flat foot [pes planus] (acquired), left foot: Secondary | ICD-10-CM | POA: Diagnosis not present

## 2022-04-23 ENCOUNTER — Telehealth: Payer: Self-pay | Admitting: Family Medicine

## 2022-04-23 DIAGNOSIS — I10 Essential (primary) hypertension: Secondary | ICD-10-CM

## 2022-04-24 NOTE — Telephone Encounter (Signed)
Based on your last note it looks like you planned to have her consider stopping losartan and increase HCTZ and metoprolol if cough returns. She's currently on Toprol '25MG'$ , and losartan-HCTZ 100-12.'5MG'$ .

## 2022-04-24 NOTE — Telephone Encounter (Signed)
Pt will call back with pulse rates.

## 2022-04-29 DIAGNOSIS — L400 Psoriasis vulgaris: Secondary | ICD-10-CM | POA: Diagnosis not present

## 2022-04-29 NOTE — Progress Notes (Unsigned)
Subjective:   Heidi Franklin is a 76 y.o. female who presents for Medicare Annual (Subsequent) preventive examination.  Review of Systems           Objective:    There were no vitals filed for this visit. There is no height or weight on file to calculate BMI.     04/25/2021    9:02 AM 07/04/2020    9:17 AM 09/07/2019   11:07 AM 01/20/2018   11:21 AM 01/14/2017   10:44 AM 09/09/2016   10:45 AM 08/07/2016   10:45 AM  Advanced Directives  Does Patient Have a Medical Advance Directive? No No No No No No No  Does patient want to make changes to medical advance directive?    Yes (ED - Information included in AVS) Yes (ED - Information included in AVS)    Would patient like information on creating a medical advance directive? No - Patient declined No - Patient declined No - Patient declined   No - patient declined information Yes - Educational materials given    Current Medications (verified) Outpatient Encounter Medications as of 05/01/2022  Medication Sig   acetaminophen (TYLENOL) 325 MG tablet Take 650 mg by mouth every 6 (six) hours as needed for mild pain or fever.   albuterol (VENTOLIN HFA) 108 (90 Base) MCG/ACT inhaler INHALE 1-2 PUFFS INTO THE LUNGS EVERY 6 (SIX) HOURS AS NEEDED FOR WHEEZING OR SHORTNESS OF BREATH.   ALPRAZolam (XANAX) 0.5 MG tablet TAKE 1 TABLET BY MOUTH 2 TIMES DAILY AS NEEDED FOR ANXIETY.   amLODipine (NORVASC) 10 MG tablet TAKE 1 TABLET BY MOUTH EVERY DAY   aspirin 81 MG chewable tablet Chew 81 mg by mouth daily.   betamethasone dipropionate (DIPROLENE) 0.05 % ointment Apply to affected areas twice a day and then as needed flare/itch   Cholecalciferol (VITAMIN D3) 50 MCG (2000 UT) capsule Take 2,000 Units by mouth daily.   famotidine (PEPCID) 40 MG tablet TAKE 1 TABLET BY MOUTH EVERY DAY   fluticasone (CUTIVATE) 0.05 % cream APPLY TO AFFECTED AREA TWICE A DAY 1 WEEK ON 1 WEEK OFF PRN FLARE   fluticasone (FLONASE) 50 MCG/ACT nasal spray SPRAY 2 SPRAYS INTO  EACH NOSTRIL EVERY DAY   glimepiride (AMARYL) 1 MG tablet TAKE 1 TABLET BY MOUTH EVERY DAY WITH BREAKFAST   glucose blood (ONE TOUCH ULTRA TEST) test strip 1 each by Other route daily. Use 1 strips to check blood sugar twice a day Ex E11.9   HUMIRA PEN-PS/UV/ADOL HS START 40 MG/0.8ML PNKT Every 2 weeks   HYDROcodone bit-homatropine (HYCODAN) 5-1.5 MG/5ML syrup Take 5 mLs by mouth at bedtime as needed for cough. (Patient taking differently: Take 5 mLs by mouth at bedtime as needed for cough. As needed)   HYDROcodone bit-homatropine (HYCODAN) 5-1.5 MG/5ML syrup Take 5 mLs by mouth every 8 (eight) hours as needed for cough.   isosorbide mononitrate (IMDUR) 30 MG 24 hr tablet Take 1 tablet (30 mg total) by mouth daily.   ketoconazole (NIZORAL) 2 % cream    Lancets (ONETOUCH ULTRASOFT) lancets PRN   levocetirizine (XYZAL) 5 MG tablet TAKE 1 TABLET BY MOUTH EVERY DAY IN THE EVENING   losartan-hydrochlorothiazide (HYZAAR) 100-12.5 MG tablet TAKE 1 TABLET BY MOUTH EVERY DAY   methylPREDNISolone (MEDROL) 4 MG tablet 4 tab po day 1, 3 tab po day 2, 2 tab po day 3 and 1 tab po day 4   methylPREDNISolone (MEDROL) 4 MG tablet 6 tabs po x 1 day  5 tabs po x 1 day then 4 tabs po x 1 day then 3 tabs po x 1 day then 2 tabs po x 1 day then 1 tab po x 1 day and stop   metoprolol succinate (TOPROL XL) 25 MG 24 hr tablet Take 1 tablet (25 mg total) by mouth daily.   mometasone (ELOCON) 0.1 % ointment    montelukast (SINGULAIR) 10 MG tablet TAKE 1 TABLET BY MOUTH EVERYDAY AT BEDTIME   Multiple Vitamins-Minerals (CENTRUM SILVER PO) Take 1 tablet by mouth every morning.   nitroGLYCERIN (NITROSTAT) 0.4 MG SL tablet Place 1 tablet (0.4 mg total) under the tongue every 5 (five) minutes as needed.   pantoprazole (PROTONIX) 40 MG tablet Take 1 tablet (40 mg total) by mouth daily.   Potassium Chloride ER 20 MEQ TBCR TAKE 1 TABLET BY MOUTH EVERY DAY   rosuvastatin (CRESTOR) 20 MG tablet Take 0.5 tablets (10 mg total) by mouth  daily.   tiZANidine (ZANAFLEX) 2 MG tablet Take 0.5-2 tablets (1-4 mg total) by mouth every 8 (eight) hours as needed for muscle spasms.   valACYclovir (VALTREX) 500 MG tablet Take 500 mg by mouth 2 (two) times daily.   No facility-administered encounter medications on file as of 05/01/2022.    Allergies (verified) Metformin and related and Penicillins   History: Past Medical History:  Diagnosis Date   ALLERGIC RHINITIS    Allergy    Anxiety state, unspecified    Arthritis    Asthma    BRONCHITIS, CHRONIC    Cataract    b/l   Chronic low back pain 10/09/2016   DEPRESSION    DIABETES MELLITUS, TYPE II    GERD    HYPERLIPIDEMIA    HYPERTENSION    Pneumonia    Psoriasis    Past Surgical History:  Procedure Laterality Date   ABDOMINAL HYSTERECTOMY  1980   CARPAL TUNNEL RELEASE Right    COLONOSCOPY     TRIGGER FINGER RELEASE Left    Index finger   UPPER GASTROINTESTINAL ENDOSCOPY     Family History  Problem Relation Age of Onset   Stroke Mother    Angina Mother    Hyperlipidemia Mother    Hypertension Mother    Heart disease Mother    Depression Mother    Diabetes Father    Hyperlipidemia Other        Parent   Hypertension Other        Parent   Diabetes Sister        x1   CAD Sister    COPD Sister        smoker   Hyperlipidemia Sister    Hypertension Sister    Diabetes Brother        x 2   Hyperlipidemia Brother    Hypertension Brother    Diabetes Brother    Hyperlipidemia Brother    Hypertension Brother    CAD Sister    Arthritis Sister    Hyperlipidemia Sister    Hypertension Sister    Colon cancer Neg Hx    Esophageal cancer Neg Hx    Rectal cancer Neg Hx    Stomach cancer Neg Hx    Pancreatic cancer Neg Hx    Social History   Socioeconomic History   Marital status: Married    Spouse name: Not on file   Number of children: 0   Years of education: 16yrcolge   Highest education level: Not on file  Occupational History  Occupation:  Retired   Tobacco Use   Smoking status: Never    Passive exposure: Never   Smokeless tobacco: Never  Vaping Use   Vaping Use: Never used  Substance and Sexual Activity   Alcohol use: No    Alcohol/week: 0.0 standard drinks of alcohol   Drug use: No   Sexual activity: Not Currently  Other Topics Concern   Not on file  Social History Narrative   Married, lives with spouse-retired from [employer] insurance   Not employed    Drinks coffee occasional, Consumes 1 soda a day    No dietary restrictions   Social Determinants of Health   Financial Resource Strain: Low Risk  (04/25/2021)   Overall Financial Resource Strain (CARDIA)    Difficulty of Paying Living Expenses: Not very hard  Food Insecurity: No Food Insecurity (04/25/2021)   Hunger Vital Sign    Worried About Running Out of Food in the Last Year: Never true    [place] in the Last Year: Never true  Transportation Needs: No Transportation Needs (04/25/2021)   PRAPARE - Hydrologist (Medical): No    Lack of Transportation (Non-Medical): No  Physical Activity: Inactive (04/25/2021)   Exercise Vital Sign    Days of Exercise per Week: 0 days    Minutes of Exercise per Session: 0 min  Stress: Stress Concern Present (04/25/2021)   [place]    Feeling of Stress : To some extent  Social Connections: Socially Integrated (04/25/2021)   Social Connection and Isolation Panel [NHANES]    Frequency of Communication with Friends and Family: More than three times a week    Frequency of Social Gatherings with Friends and Family: More than three times a week    Attends Religious Services: More than 4 times per year    Active Member of [employer] or Organizations: Yes    Attends Archivist Meetings: 1 to 4 times per year    Marital Status: Married    Tobacco Counseling Counseling given: Not Answered   Clinical Intake:                  Diabetic?yes Nutrition Risk Assessment:  Has the patient had any N/V/D within the last 2 months?  {YES/NO:21197} Does the patient have any non-healing wounds?  {YES/NO:21197} Has the patient had any unintentional weight loss or weight gain?  {YES/NO:21197}  Diabetes:  Is the patient diabetic?  {YES/NO:21197} If diabetic, was a CBG obtained today?  {YES/NO:21197} Did the patient bring in their glucometer from home?  {YES/NO:21197} How often do you monitor your CBG's? ***.   Financial Strains and Diabetes Management:  Are you having any financial strains with the device, your supplies or your medication? {YES/NO:21197}.  Does the patient want to be seen by Chronic Care Management for management of their diabetes?  {YES/NO:21197} Would the patient like to be referred to a Nutritionist or for Diabetic Management?  {YES/NO:21197}  Diabetic Exams:  Diabetic Eye Exam: Completed 10/17/21 Diabetic Foot Exam: Completed 04/22/22            Activities of Daily Living     No data to display          Patient Care Team: [name], MD as PCP - General (Family Medicine) Chesley Mires, MD (Pulmonary Disease) Tuchman, Leslye Peer, DPM (Inactive) (Podiatry) Almedia Balls, MD (Orthopedic Surgery) Irene Shipper, MD (Gastroenterology) Star Age, MD (  Neurology)  Indicate any recent Medical Services you may have received from other than [facility] providers in the past year (date may be approximate).     Assessment:   This is a routine wellness examination for Heidi Franklin.  Hearing/Vision screen No results found.  Dietary issues and exercise activities discussed:     Goals Addressed   None    Depression Screen    03/26/2022    8:47 AM 06/15/2021   10:32 AM 04/25/2021    9:04 AM 01/18/2021    2:11 PM 09/07/2019   11:09 AM 01/20/2018   11:24 AM 08/08/2017   11:40 AM  PHQ 2/9 Scores  PHQ - 2 Score 0 0 1 1 0 0 0  PHQ- 9 Score    3  5 0    Fall Risk    03/26/2022    8:46  AM 06/15/2021   10:32 AM 04/25/2021    9:03 AM 09/22/2019    5:27 PM 09/07/2019   11:09 AM  Fall Risk   Falls in the past year? 0 0 0 0 0  Comment    Emmi Telephone Survey: data to providers prior to load   Number falls in past yr: 0 0 0  0  Injury with Fall? 0 0 0  0  Risk for fall due to : No Fall Risks No Fall Risks     Follow up Falls evaluation completed Falls evaluation completed Falls prevention discussed      FALL RISK PREVENTION PERTAINING TO THE HOME:  Any stairs in or around the home? {YES/NO:21197} If so, are there any without handrails? {YES/NO:21197} Home free of loose throw rugs in walkways, pet beds, electrical cords, etc? {YES/NO:21197} Adequate lighting in your home to reduce risk of falls? {YES/NO:21197}  ASSISTIVE DEVICES UTILIZED TO PREVENT FALLS:  Life alert? {YES/NO:21197} Use of a cane, walker or w/c? {YES/NO:21197} Grab bars in the bathroom? {YES/NO:21197} Shower chair or bench in shower? {YES/NO:21197} Elevated toilet seat or a handicapped toilet? {YES/NO:21197}  TIMED UP AND GO:  Was the test performed? {YES/NO:21197}.  Length of time to ambulate 10 feet: *** sec.   {Appearance of HKVQ:2595638}  Cognitive Function:    01/20/2018   11:26 AM  MMSE - Mini Mental State Exam  Orientation to time 5  Orientation to Place 5  Registration 3  Attention/ Calculation 5  Recall 2  Language- name 2 objects 2  Language- repeat 1  Language- follow 3 step command 3  Language- read & follow direction 1  Write a sentence 1  Copy design 1  Total score 29        Immunizations Immunization History  Administered Date(s) Administered   Fluad Quad(high Dose 65+) 06/25/2019, 07/14/2020, 07/17/2021   Influenza Split 08/20/2011, 07/30/2012, 06/28/2014   Influenza Whole 07/28/2009, 07/06/2010   Influenza, High Dose Seasonal PF 06/19/2018   Influenza, Seasonal, Injecte, Preservative Fre 06/25/2019   Influenza,inj,Quad PF,6+ Mos 06/23/2013, 07/05/2015,  06/20/2016, 06/23/2017   Influenza-Unspecified 06/28/2014   Moderna Covid-19 Vaccine Bivalent Booster 56yr & up 09/07/2021   Moderna Sars-Covid-2 Vaccination 11/27/2019, 12/27/2019, 09/12/2020   Pneumococcal Conjugate-13 06/15/2015   Pneumococcal Polysaccharide-23 10/02/2011   Td 11/23/2013   Zoster Recombinat (Shingrix) 01/22/2022    TDAP status: Up to date  Flu Vaccine status: Up to date  Pneumococcal vaccine status: Up to date  Covid-19 vaccine status: Completed vaccines  Qualifies for Shingles Vaccine? Yes   Zostavax completed No   {Shingrix Completed?:2101804}  Screening Tests Health Maintenance  Topic Date  Due   Zoster Vaccines- Shingrix (2 of 2) 03/19/2022   INFLUENZA VACCINE  05/28/2022   HEMOGLOBIN A1C  09/26/2022   OPHTHALMOLOGY EXAM  10/17/2022   FOOT EXAM  04/23/2023   TETANUS/TDAP  11/24/2023   Pneumonia Vaccine 43+ Years old  Completed   DEXA SCAN  Completed   COVID-19 Vaccine  Completed   Hepatitis C Screening  Completed   HPV VACCINES  Aged Out   COLONOSCOPY (Pts 45-36yr Insurance coverage will need to be confirmed)  Discontinued    Health Maintenance  Health Maintenance Due  Topic Date Due   Zoster Vaccines- Shingrix (2 of 2) 03/19/2022    Colorectal cancer screening: No longer required.   Mammogram status: Completed 04/11/22. Repeat every year  {Bone Density status:21018021}  Lung Cancer Screening: (Low Dose CT Chest recommended if Age 77-80years, 30 pack-year currently smoking OR have quit w/in 15years.) does not qualify.   Lung Cancer Screening Referral: n/a  Additional Screening:  Hepatitis C Screening: does qualify; Completed 02/22/16  Vision Screening: Recommended annual ophthalmology exams for early detection of glaucoma and other disorders of the eye. Is the patient up to date with their annual eye exam?  {YES/NO:21197} Who is the provider or what is the name of the office in which the patient attends annual eye exams? *** If pt  is not established with a provider, would they like to be referred to a provider to establish care? {YES/NO:21197}.   Dental Screening: Recommended annual dental exams for proper oral hygiene  Community Resource Referral / Chronic Care Management: CRR required this visit?  {YES/NO:21197}  CCM required this visit?  {YES/NO:21197}     Plan:     I have personally reviewed and noted the following in the patient's chart:   Medical and social history Use of alcohol, tobacco or illicit drugs  Current medications and supplements including opioid prescriptions.  Functional ability and status Nutritional status Physical activity Advanced directives List of other physicians Hospitalizations, surgeries, and ER visits in previous 12 months Vitals Screenings to include cognitive, depression, and falls Referrals and appointments  In addition, I have reviewed and discussed with patient certain preventive protocols, quality metrics, and best practice recommendations. A written personalized care plan for preventive services as well as general preventive health recommendations were provided to patient.     SDuard BradyChism, CMaynard  04/29/2022   Nurse Notes: ***

## 2022-04-29 NOTE — Progress Notes (Addendum)
ANNUAL DIABETIC FOOT EXAM  Subjective: Heidi Franklin presents today for annual diabetic foot examination.  Patient relates 76 year h/o diabetes.  Patient denies any h/o foot wounds.  Patient denies any numbness, tingling, burning, or pins/needle sensation in feet.  Patient's blood sugar was 199 mg/dl today. Last known  HgA1c was 6.8%   Risk factors: diabetes, HTN, hyperlipidemia.  [name], MD is patient's PCP. Last visit was Mar 26, 2022.  Past Medical History:  Diagnosis Date   ALLERGIC RHINITIS    Allergy    Anxiety state, unspecified    Arthritis    Asthma    BRONCHITIS, CHRONIC    Cataract    b/l   Chronic low back pain 10/09/2016   DEPRESSION    DIABETES MELLITUS, TYPE II    GERD    HYPERLIPIDEMIA    HYPERTENSION    Pneumonia    Psoriasis    Patient Active Problem List   Diagnosis Date Noted   ALLERGIC RHINITIS 11/15/2021   Arthritis 11/15/2021   Asthma 11/15/2021   BRONCHITIS, CHRONIC 11/15/2021   Constipation 10/18/2021   Type 2 diabetes mellitus with hyperglycemia ([place]) 09/24/2021   Acute bilateral knee pain 09/24/2021   Vitiligo 09/07/2021   Pedal edema 03/27/2020   Body mass index (BMI) 31.0-31.9, adult 11/08/2019   Disc displacement, lumbar 11/08/2019   Strain of lumbar paraspinal muscle 11/08/2019   History of chicken pox 04/11/2019   Vitamin D deficiency 04/05/2019   Lumbar radiculopathy 03/31/2019   Neck pain 07/06/2018   Trapezius muscle strain, right, initial encounter 06/23/2018   Edema 06/23/2018   Gout 04/08/2018   Herpes simplex 02/10/2018   ETD (eustachian tube dysfunction) 02/10/2018   Peroneal tendinitis, right 01/06/2018   Psoriasis 11/14/2017   Acute bursitis of right shoulder 09/29/2017   Bilateral hip pain 07/10/2017   Bronchiectasis without complication ([place]) 22/97/9892   Irritable larynx syndrome 04/25/2017   Reflux laryngitis 04/23/2017   Benign lipomatous neoplasm of skin and subcutaneous tissue of right leg  02/25/2017   Anemia 02/05/2017   Fatigue 01/23/2017   Cough 01/02/2017   Multiple pulmonary nodules 11/10/2016   Chronic low back pain 10/09/2016   Degenerative arthritis of right knee 06/20/2016   Degenerative arthritis of left knee 04/10/2016   Bronchitis 12/15/2015   Peroneal tendinitis of left lower extremity 09/05/2015   Recurrent falls 08/08/2015   Bursitis of left shoulder 07/13/2015   Morbid obesity due to excess calories ([place]) 04/16/2015   Nonspecific abnormal electrocardiogram (ECG) (EKG) 05/11/2011   Hyperlipidemia associated with type 2 diabetes mellitus ([place]) 03/02/2010   Depression with anxiety 03/02/2010   Essential hypertension 03/02/2010   Chronic rhinitis 03/02/2010   Upper airway cough syndrome 03/02/2010   GERD 03/02/2010   Past Surgical History:  Procedure Laterality Date   ABDOMINAL HYSTERECTOMY  1980   CARPAL TUNNEL RELEASE Right    COLONOSCOPY     TRIGGER FINGER RELEASE Left    Index finger   UPPER GASTROINTESTINAL ENDOSCOPY     Current Outpatient Medications on File Prior to Visit  Medication Sig Dispense Refill   acetaminophen (TYLENOL) 325 MG tablet Take 650 mg by mouth every 6 (six) hours as needed for mild pain or fever.     albuterol (VENTOLIN HFA) 108 (90 Base) MCG/ACT inhaler INHALE 1-2 PUFFS INTO THE LUNGS EVERY 6 (SIX) HOURS AS NEEDED FOR WHEEZING OR SHORTNESS OF BREATH. 8.5 each 3   ALPRAZolam (XANAX) 0.5 MG tablet TAKE 1 TABLET BY MOUTH 2 TIMES  DAILY AS NEEDED FOR ANXIETY. 60 tablet 1   amLODipine (NORVASC) 10 MG tablet TAKE 1 TABLET BY MOUTH EVERY DAY 90 tablet 1   aspirin 81 MG chewable tablet Chew 81 mg by mouth daily.     betamethasone dipropionate (DIPROLENE) 0.05 % ointment Apply to affected areas twice a day and then as needed flare/itch     Cholecalciferol (VITAMIN D3) 50 MCG (2000 UT) capsule Take 2,000 Units by mouth daily.     famotidine (PEPCID) 40 MG tablet TAKE 1 TABLET BY MOUTH EVERY DAY 90 tablet 1   fluticasone (CUTIVATE)  0.05 % cream APPLY TO AFFECTED AREA TWICE A DAY 1 WEEK ON 1 WEEK OFF PRN FLARE     fluticasone (FLONASE) 50 MCG/ACT nasal spray SPRAY 2 SPRAYS INTO EACH NOSTRIL EVERY DAY 48 mL 2   glimepiride (AMARYL) 1 MG tablet TAKE 1 TABLET BY MOUTH EVERY DAY WITH BREAKFAST 90 tablet 1   glucose blood (ONE TOUCH ULTRA TEST) test strip 1 each by Other route daily. Use 1 strips to check blood sugar twice a day Ex E11.9 100 each 3   HUMIRA PEN-PS/UV/ADOL HS START 40 MG/0.8ML PNKT Every 2 weeks     HYDROcodone bit-homatropine (HYCODAN) 5-1.5 MG/5ML syrup Take 5 mLs by mouth at bedtime as needed for cough. (Patient taking differently: Take 5 mLs by mouth at bedtime as needed for cough. As needed) 120 mL 0   HYDROcodone bit-homatropine (HYCODAN) 5-1.5 MG/5ML syrup Take 5 mLs by mouth every 8 (eight) hours as needed for cough. 120 mL 0   isosorbide mononitrate (IMDUR) 30 MG 24 hr tablet Take 1 tablet (30 mg total) by mouth daily. 90 tablet 3   ketoconazole (NIZORAL) 2 % cream      Lancets (ONETOUCH ULTRASOFT) lancets PRN 100 each 3   levocetirizine (XYZAL) 5 MG tablet TAKE 1 TABLET BY MOUTH EVERY DAY IN THE EVENING 30 tablet 0   losartan-hydrochlorothiazide (HYZAAR) 100-12.5 MG tablet TAKE 1 TABLET BY MOUTH EVERY DAY 90 tablet 1   methylPREDNISolone (MEDROL) 4 MG tablet 4 tab po day 1, 3 tab po day 2, 2 tab po day 3 and 1 tab po day 4 10 tablet 0   methylPREDNISolone (MEDROL) 4 MG tablet 6 tabs po x 1 day 5 tabs po x 1 day then 4 tabs po x 1 day then 3 tabs po x 1 day then 2 tabs po x 1 day then 1 tab po x 1 day and stop 21 tablet 0   metoprolol succinate (TOPROL XL) 25 MG 24 hr tablet Take 1 tablet (25 mg total) by mouth daily. 90 tablet 3   mometasone (ELOCON) 0.1 % ointment      montelukast (SINGULAIR) 10 MG tablet TAKE 1 TABLET BY MOUTH EVERYDAY AT BEDTIME 90 tablet 1   Multiple Vitamins-Minerals (CENTRUM SILVER PO) Take 1 tablet by mouth every morning.     nitroGLYCERIN (NITROSTAT) 0.4 MG SL tablet Place 1 tablet  (0.4 mg total) under the tongue every 5 (five) minutes as needed. 30 tablet 3   pantoprazole (PROTONIX) 40 MG tablet Take 1 tablet (40 mg total) by mouth daily. 90 tablet 0   Potassium Chloride ER 20 MEQ TBCR TAKE 1 TABLET BY MOUTH EVERY DAY 90 tablet 1   rosuvastatin (CRESTOR) 20 MG tablet Take 0.5 tablets (10 mg total) by mouth daily. 90 tablet 3   tiZANidine (ZANAFLEX) 2 MG tablet Take 0.5-2 tablets (1-4 mg total) by mouth every 8 (eight) hours as needed  for muscle spasms. 40 tablet 1   valACYclovir (VALTREX) 500 MG tablet Take 500 mg by mouth 2 (two) times daily.  0   No current facility-administered medications on file prior to visit.    Allergies  Allergen Reactions   Metformin And Related Diarrhea   Penicillins Rash   Social History   Occupational History   Occupation: Retired   Tobacco Use   Smoking status: Never    Passive exposure: Never   Smokeless tobacco: Never  Vaping Use   Vaping Use: Never used  Substance and Sexual Activity   Alcohol use: No    Alcohol/week: 0.0 standard drinks of alcohol   Drug use: No   Sexual activity: Not Currently   Family History  Problem Relation Age of Onset   Stroke Mother    Angina Mother    Hyperlipidemia Mother    Hypertension Mother    Heart disease Mother    Depression Mother    Diabetes Father    Hyperlipidemia Other        Parent   Hypertension Other        Parent   Diabetes Sister        x1   CAD Sister    COPD Sister        smoker   Hyperlipidemia Sister    Hypertension Sister    Diabetes Brother        x 2   Hyperlipidemia Brother    Hypertension Brother    Diabetes Brother    Hyperlipidemia Brother    Hypertension Brother    CAD Sister    Arthritis Sister    Hyperlipidemia Sister    Hypertension Sister    Colon cancer Neg Hx    Esophageal cancer Neg Hx    Rectal cancer Neg Hx    Stomach cancer Neg Hx    Pancreatic cancer Neg Hx    Immunization History  Administered Date(s) Administered   Fluad  Quad(high Dose 65+) 06/25/2019, 07/14/2020, 07/17/2021   Influenza Split 08/20/2011, 07/30/2012, 06/28/2014   Influenza Whole 07/28/2009, 07/06/2010   Influenza, High Dose Seasonal PF 06/19/2018   Influenza, Seasonal, Injecte, Preservative Fre 06/25/2019   Influenza,inj,Quad PF,6+ Mos 06/23/2013, 07/05/2015, 06/20/2016, 06/23/2017   Influenza-Unspecified 06/28/2014   Moderna Covid-19 Vaccine Bivalent Booster 49yr & up 09/07/2021   Moderna Sars-Covid-2 Vaccination 11/27/2019, 12/27/2019, 09/12/2020   Pneumococcal Conjugate-13 06/15/2015   Pneumococcal Polysaccharide-23 10/02/2011   Td 11/23/2013   Zoster Recombinat (Shingrix) 01/22/2022     Review of Systems: Negative except as noted in the HPI.   Objective: There were no vitals filed for this visit.  BGENESIS NOVOSADis a pleasant 76y.o. female in NAD. AAO X 3.  Vascular Examination: CFT <3 seconds b/l LE. Palpable DP pulse(s) b/l LE. Faintly palpable PT pulse(s) b/l LE. Pedal hair absent. No pain with calf compression b/l. Lower extremity skin temperature gradient within normal limits. No edema noted b/l LE. No cyanosis or clubbing noted b/l LE.  Dermatological Examination: Pedal skin is warm and supple b/l LE. No open wounds b/l LE. No interdigital macerations noted b/l LE. Toenails 1-5 b/l elongated, discolored, dystrophic, thickened, crumbly with subungual debris and tenderness to dorsal palpation.  Neurological Examination: Protective sensation intact 5/5 intact bilaterally with 10g monofilament b/l. Vibratory sensation intact b/l. Proprioception intact bilaterally.  Musculoskeletal Examination: Normal muscle strength 5/5 to all lower extremity muscle groups bilaterally. HAV with bunion deformity noted b/l LE. Pes planus deformity noted bilateral LE..Marland KitchenNo pain, crepitus  or joint limitation noted with ROM b/l LE.  Patient ambulates independently without assistive aids.  Footwear Assessment: Does the patient wear appropriate  shoes? Yes. Does the patient need inserts/orthotics? Yes.     Latest Ref Rng & Units 03/26/2022    9:34 AM 09/26/2021    9:44 AM  Hemoglobin A1C  Hemoglobin-A1c 4.6 - 6.5 % 6.7  6.9    Assessment: 1. Pain due to onychomycosis of toenails of both feet   2. Pes planus of both feet   3. Hallux valgus, acquired, bilateral   4. Diabetes mellitus without complication ([place])   5. Encounter for diabetic foot exam ([place])     ADA Risk Categorization: Low Risk :  Patient has all of the following: Intact protective sensation No prior foot ulcer  No severe deformity Pedal pulses present  Plan: -Patient was evaluated and treated. All patient's and/or POA's questions/concerns answered on today's visit. -Patient to continue soft, supportive shoe gear daily. -Mycotic toenails 1-5 bilaterally were debrided in length and girth with sterile nail nippers and dremel without incident. -Patient/POA to call should there be question/concern in the interim. Return in about 3 months (around 07/23/2022).  Marzetta Board, DPM

## 2022-05-01 ENCOUNTER — Ambulatory Visit (INDEPENDENT_AMBULATORY_CARE_PROVIDER_SITE_OTHER): Payer: Medicare Other

## 2022-05-01 DIAGNOSIS — Z0000 Encounter for general adult medical examination without abnormal findings: Secondary | ICD-10-CM | POA: Diagnosis not present

## 2022-05-01 DIAGNOSIS — Z780 Asymptomatic menopausal state: Secondary | ICD-10-CM

## 2022-05-01 MED ORDER — ADULT BLOOD PRESSURE CUFF LG KIT: PACK | 0 refills | Status: DC

## 2022-05-01 MED ORDER — HYDROCHLOROTHIAZIDE 25 MG PO TABS
25.0000 mg | ORAL_TABLET | Freq: Every day | ORAL | 1 refills | Status: DC
Start: 2022-05-01 — End: 2022-06-04

## 2022-05-01 NOTE — Telephone Encounter (Signed)
Pt reports pulse rates ranging from 66-88. HCTZ sent in, losartan discontinued. Pt advised she can double her Toprol '25mg'$  until she needs a refill, then we will send in the '50mg'$  dose. Pt verbalized understanding. Pt will call back to make nurse visit.

## 2022-05-01 NOTE — Telephone Encounter (Signed)
Spoke with pt today and she states that her pulse rates have been normal.

## 2022-05-01 NOTE — Patient Instructions (Signed)
Heidi Franklin , Thank you for taking time to come for your Medicare Wellness Visit. I appreciate your ongoing commitment to your health goals. Please review the following plan we discussed and let me know if I can assist you in the future.   Screening recommendations/referrals: Colonoscopy: no longer needed Mammogram: 04/11/22 due 04/12/23 Bone Density: ordered 05/01/22 Recommended yearly ophthalmology/optometry visit for glaucoma screening and checkup Recommended yearly dental visit for hygiene and checkup  Vaccinations: Influenza vaccine: up to date Pneumococcal vaccine: up to date Tdap vaccine: up to date Shingles vaccine: Due-May obtain vaccine at our office or your local pharmacy.    Covid-19:completed  Advanced directives: no  Conditions/risks identified: see problem list   Next appointment: Follow up in one year for your annual wellness visit 05/05/23   Preventive Care 65 Years and Older, Female Preventive care refers to lifestyle choices and visits with your health care provider that can promote health and wellness. What does preventive care include? A yearly physical exam. This is also called an annual well check. Dental exams once or twice a year. Routine eye exams. Ask your health care provider how often you should have your eyes checked. Personal lifestyle choices, including: Daily care of your teeth and gums. Regular physical activity. Eating a healthy diet. Avoiding tobacco and drug use. Limiting alcohol use. Practicing safe sex. Taking low-dose aspirin every day. Taking vitamin and mineral supplements as recommended by your health care provider. What happens during an annual well check? The services and screenings done by your health care provider during your annual well check will depend on your age, overall health, lifestyle risk factors, and family history of disease. Counseling  Your health care provider may ask you questions about your: Alcohol use. Tobacco  use. Drug use. Emotional well-being. Home and relationship well-being. Sexual activity. Eating habits. History of falls. Memory and ability to understand (cognition). Work and work Statistician. Reproductive health. Screening  You may have the following tests or measurements: Height, weight, and BMI. Blood pressure. Lipid and cholesterol levels. These may be checked every 5 years, or more frequently if you are over 25 years old. Skin check. Lung cancer screening. You may have this screening every year starting at age 25 if you have a 30-pack-year history of smoking and currently smoke or have quit within the past 15 years. Fecal occult blood test (FOBT) of the stool. You may have this test every year starting at age 17. Flexible sigmoidoscopy or colonoscopy. You may have a sigmoidoscopy every 5 years or a colonoscopy every 10 years starting at age 61. Hepatitis C blood test. Hepatitis B blood test. Sexually transmitted disease (STD) testing. Diabetes screening. This is done by checking your blood sugar (glucose) after you have not eaten for a while (fasting). You may have this done every 1-3 years. Bone density scan. This is done to screen for osteoporosis. You may have this done starting at age 14. Mammogram. This may be done every 1-2 years. Talk to your health care provider about how often you should have regular mammograms. Talk with your health care provider about your test results, treatment options, and if necessary, the need for more tests. Vaccines  Your health care provider may recommend certain vaccines, such as: Influenza vaccine. This is recommended every year. Tetanus, diphtheria, and acellular pertussis (Tdap, Td) vaccine. You may need a Td booster every 10 years. Zoster vaccine. You may need this after age 52. Pneumococcal 13-valent conjugate (PCV13) vaccine. One dose is recommended after age  77. Pneumococcal polysaccharide (PPSV23) vaccine. One dose is recommended  after age 80. Talk to your health care provider about which screenings and vaccines you need and how often you need them. This information is not intended to replace advice given to you by your health care provider. Make sure you discuss any questions you have with your health care provider. Document Released: 11/10/2015 Document Revised: 07/03/2016 Document Reviewed: 08/15/2015 Elsevier Interactive Patient Education  2017 [place] Prevention in the Home Falls can cause injuries. They can happen to people of all ages. There are many things you can do to make your home safe and to help prevent falls. What can I do on the outside of my home? Regularly fix the edges of walkways and driveways and fix any cracks. Remove anything that might make you trip as you walk through a door, such as a raised step or threshold. Trim any bushes or trees on the path to your home. Use bright outdoor lighting. Clear any walking paths of anything that might make someone trip, such as rocks or tools. Regularly check to see if handrails are loose or broken. Make sure that both sides of any steps have handrails. Any raised decks and porches should have guardrails on the edges. Have any leaves, snow, or ice cleared regularly. Use sand or salt on walking paths during winter. Clean up any spills in your garage right away. This includes oil or grease spills. What can I do in the bathroom? Use night lights. Install grab bars by the toilet and in the tub and shower. Do not use towel bars as grab bars. Use non-skid mats or decals in the tub or shower. If you need to sit down in the shower, use a plastic, non-slip stool. Keep the floor dry. Clean up any water that spills on the floor as soon as it happens. Remove soap buildup in the tub or shower regularly. Attach bath mats securely with double-sided non-slip rug tape. Do not have throw rugs and other things on the floor that can make you trip. What can I do  in the bedroom? Use night lights. Make sure that you have a light by your bed that is easy to reach. Do not use any sheets or blankets that are too big for your bed. They should not hang down onto the floor. Have a firm chair that has side arms. You can use this for support while you get dressed. Do not have throw rugs and other things on the floor that can make you trip. What can I do in the kitchen? Clean up any spills right away. Avoid walking on wet floors. Keep items that you use a lot in easy-to-reach places. If you need to reach something above you, use a strong step stool that has a grab bar. Keep electrical cords out of the way. Do not use floor polish or wax that makes floors slippery. If you must use wax, use non-skid floor wax. Do not have throw rugs and other things on the floor that can make you trip. What can I do with my stairs? Do not leave any items on the stairs. Make sure that there are handrails on both sides of the stairs and use them. Fix handrails that are broken or loose. Make sure that handrails are as long as the stairways. Check any carpeting to make sure that it is firmly attached to the stairs. Fix any carpet that is loose or worn. Avoid having throw rugs at  the top or bottom of the stairs. If you do have throw rugs, attach them to the floor with carpet tape. Make sure that you have a light switch at the top of the stairs and the bottom of the stairs. If you do not have them, ask someone to add them for you. What else can I do to help prevent falls? Wear shoes that: Do not have high heels. Have rubber bottoms. Are comfortable and fit you well. Are closed at the toe. Do not wear sandals. If you use a stepladder: Make sure that it is fully opened. Do not climb a closed stepladder. Make sure that both sides of the stepladder are locked into place. Ask someone to hold it for you, if possible. Clearly mark and make sure that you can see: Any grab bars or  handrails. First and last steps. Where the edge of each step is. Use tools that help you move around (mobility aids) if they are needed. These include: Canes. Walkers. Scooters. Crutches. Turn on the lights when you go into a dark area. Replace any light bulbs as soon as they burn out. Set up your furniture so you have a clear path. Avoid moving your furniture around. If any of your floors are uneven, fix them. If there are any pets around you, be aware of where they are. Review your medicines with your doctor. Some medicines can make you feel dizzy. This can increase your chance of falling. Ask your doctor what other things that you can do to help prevent falls. This information is not intended to replace advice given to you by your health care provider. Make sure you discuss any questions you have with your health care provider. Document Released: 08/10/2009 Document Revised: 03/21/2016 Document Reviewed: 11/18/2014 Elsevier Interactive Patient Education  2017 [employer].

## 2022-05-02 ENCOUNTER — Other Ambulatory Visit: Payer: Self-pay | Admitting: Medical

## 2022-05-06 ENCOUNTER — Ambulatory Visit: Payer: Medicare Other | Admitting: Dietician

## 2022-05-14 DIAGNOSIS — L400 Psoriasis vulgaris: Secondary | ICD-10-CM | POA: Diagnosis not present

## 2022-05-16 ENCOUNTER — Other Ambulatory Visit (HOSPITAL_BASED_OUTPATIENT_CLINIC_OR_DEPARTMENT_OTHER): Payer: Medicare Other

## 2022-05-27 ENCOUNTER — Other Ambulatory Visit (HOSPITAL_BASED_OUTPATIENT_CLINIC_OR_DEPARTMENT_OTHER): Payer: Medicare Other

## 2022-05-28 DIAGNOSIS — L400 Psoriasis vulgaris: Secondary | ICD-10-CM | POA: Diagnosis not present

## 2022-05-30 ENCOUNTER — Telehealth: Payer: Self-pay | Admitting: Cardiology

## 2022-05-30 IMAGING — DX DG CHEST 2V
2 series · 2 of 2 positions shown · non-contrast
Comparison: January 29, 2021.

CLINICAL DATA: Bronchitis.

EXAM:
CHEST - 2 VIEW

[chest pa]
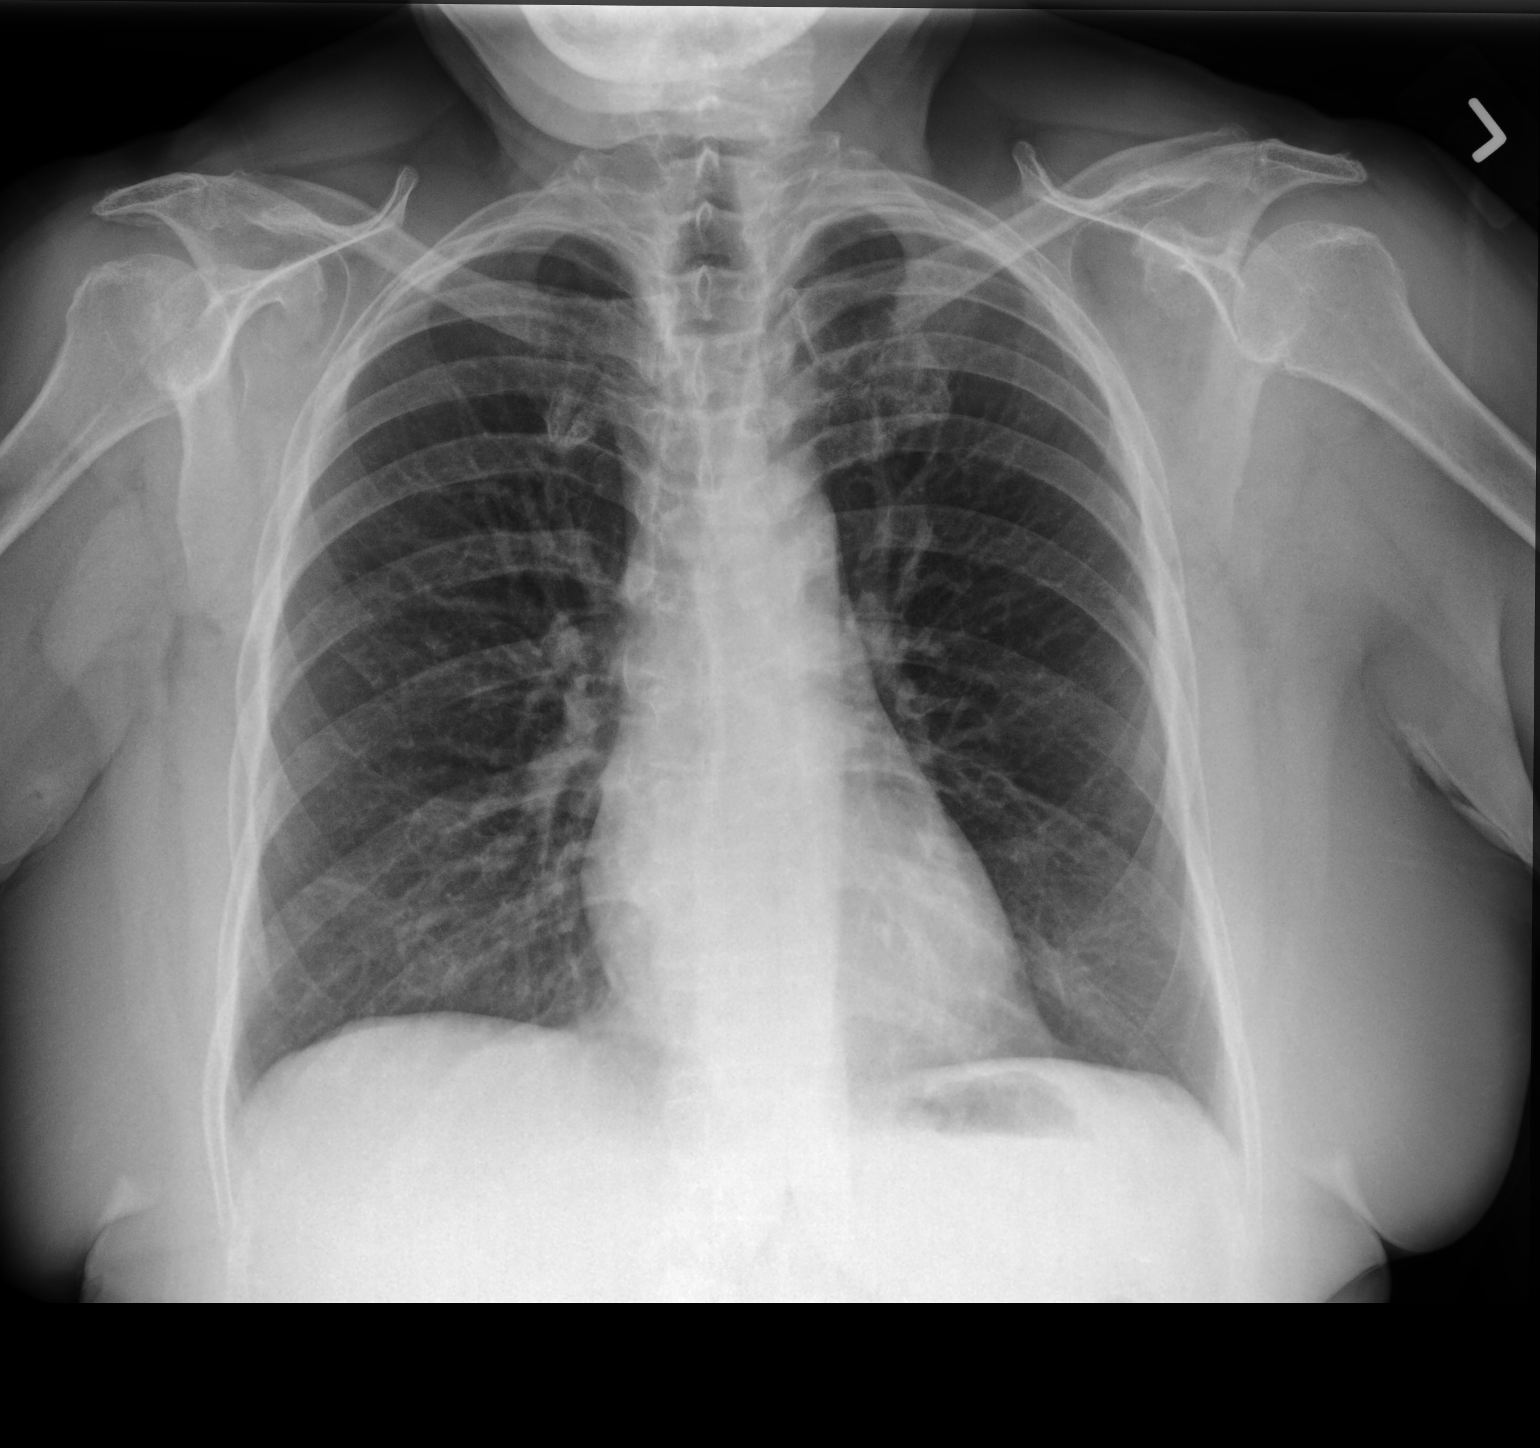

[chest lat]
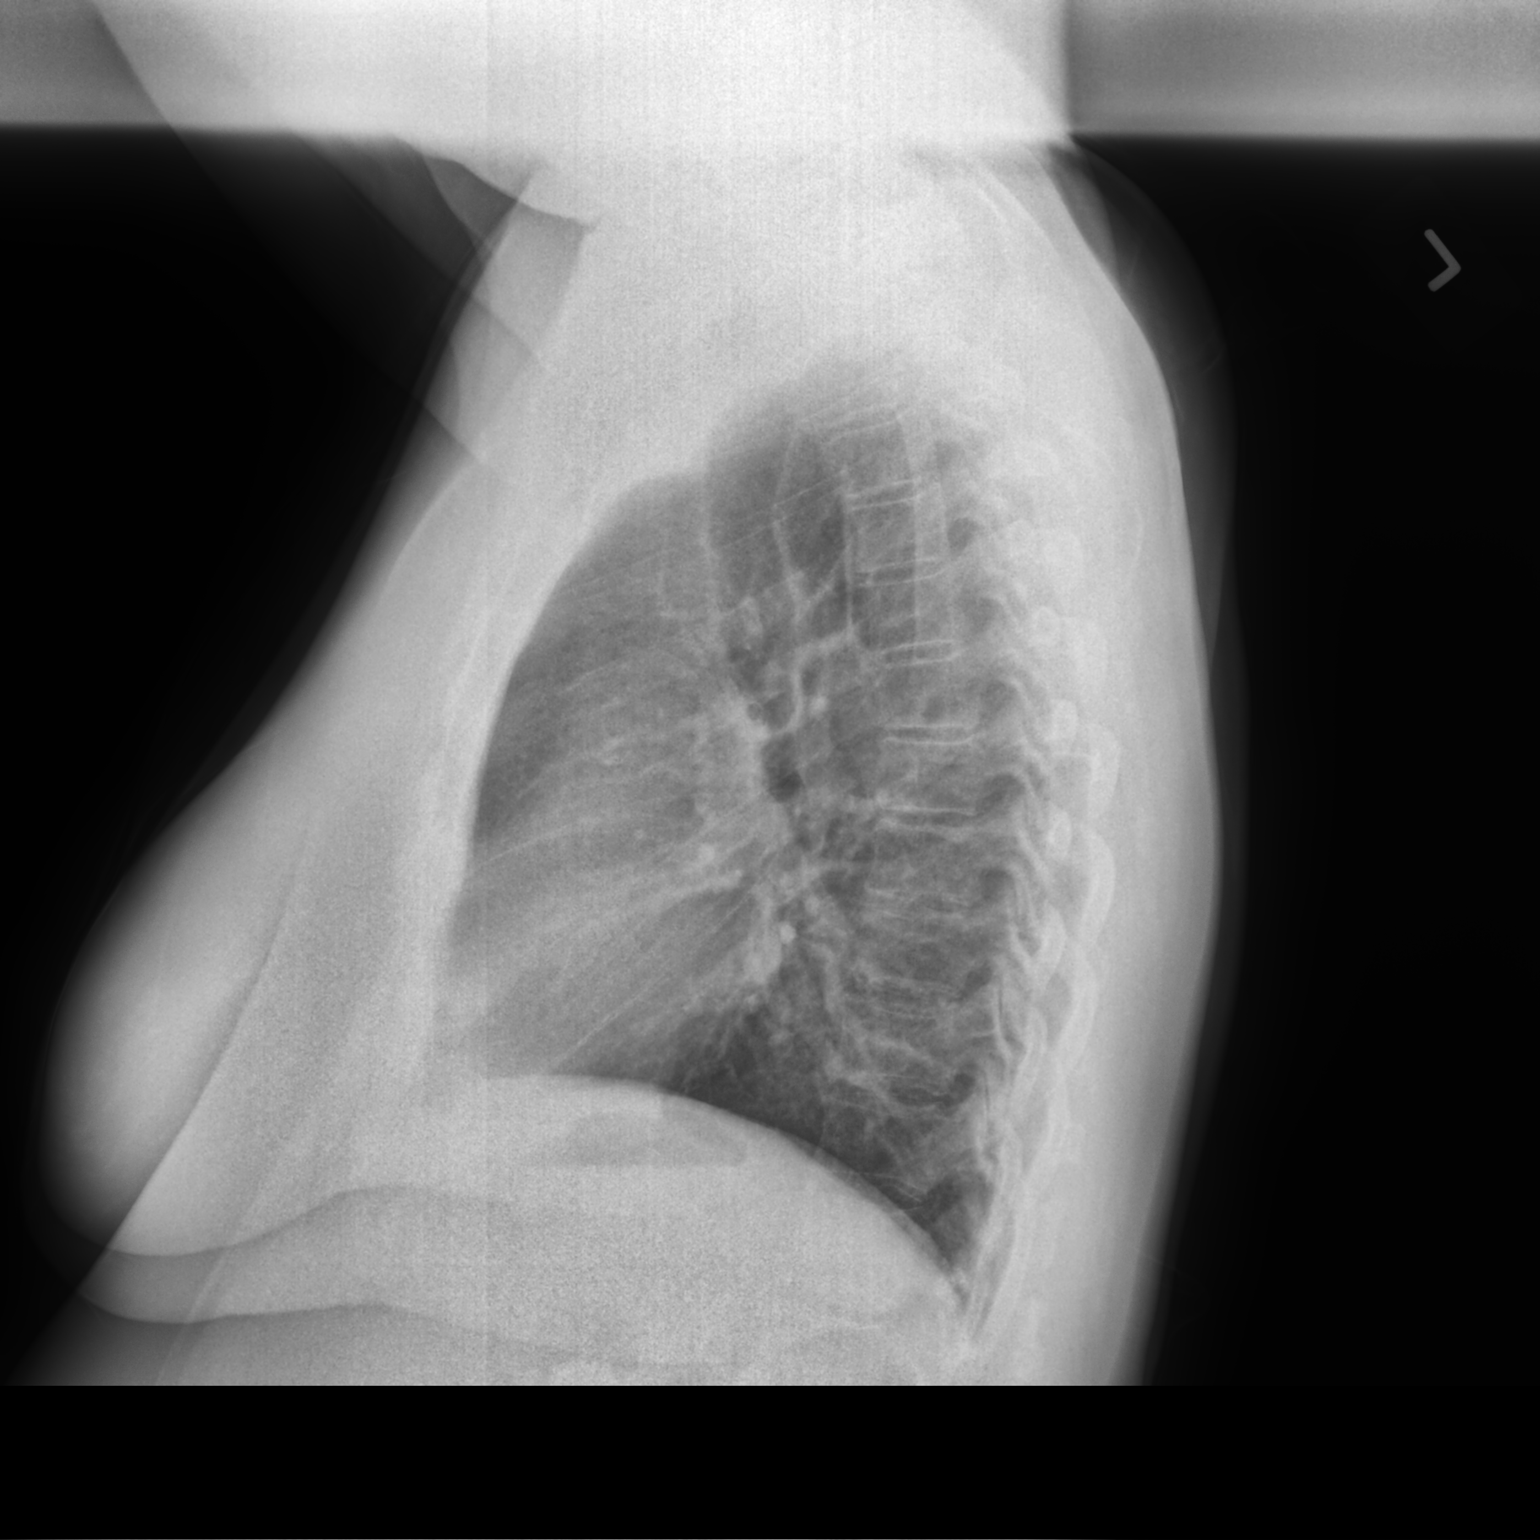

[2 of 2 positions shown; findings below may reference images not displayed]

FINDINGS: The heart size and mediastinal contours are within normal limits.
Both lungs are clear. The visualized skeletal structures are
unremarkable.
IMPRESSION: No active cardiopulmonary disease.

## 2022-05-30 NOTE — Telephone Encounter (Signed)
Pt c/o of Chest Pain: STAT if CP now or developed within 24 hours  1. Are you having CP right now? No  2. Are you experiencing any other symptoms (ex. SOB, nausea, vomiting, sweating)? No  3. How long have you been experiencing CP? Not so much chest pain, but a pulling sensation. She says it started about 2 weeks ago.   4. Is your CP continuous or coming and going? Coming and going   5. Have you taken Nitroglycerin? No  ?

## 2022-05-31 ENCOUNTER — Telehealth: Payer: Self-pay

## 2022-05-31 ENCOUNTER — Encounter (HOSPITAL_COMMUNITY): Payer: Self-pay

## 2022-05-31 ENCOUNTER — Other Ambulatory Visit: Payer: Self-pay

## 2022-05-31 ENCOUNTER — Emergency Department (HOSPITAL_BASED_OUTPATIENT_CLINIC_OR_DEPARTMENT_OTHER)
Admission: EM | Admit: 2022-05-31 | Discharge: 2022-05-31 | Disposition: A | Discharge status: Home / Self Care | Payer: Medicare Other | Attending: Emergency Medicine | Admitting: Emergency Medicine

## 2022-05-31 ENCOUNTER — Encounter (HOSPITAL_BASED_OUTPATIENT_CLINIC_OR_DEPARTMENT_OTHER): Payer: Self-pay | Admitting: Urology

## 2022-05-31 ENCOUNTER — Emergency Department (HOSPITAL_BASED_OUTPATIENT_CLINIC_OR_DEPARTMENT_OTHER): Payer: Medicare Other

## 2022-05-31 DIAGNOSIS — R079 Chest pain, unspecified: Secondary | ICD-10-CM

## 2022-05-31 DIAGNOSIS — R072 Precordial pain: Secondary | ICD-10-CM | POA: Diagnosis present

## 2022-05-31 DIAGNOSIS — Z7982 Long term (current) use of aspirin: Secondary | ICD-10-CM | POA: Insufficient documentation

## 2022-05-31 DIAGNOSIS — R0789 Other chest pain: Secondary | ICD-10-CM | POA: Diagnosis not present

## 2022-05-31 DIAGNOSIS — E876 Hypokalemia: Secondary | ICD-10-CM | POA: Insufficient documentation

## 2022-05-31 LAB — CBC
HCT: 38.2 % (ref 36.0–46.0)
Hemoglobin: 12.9 g/dL (ref 12.0–15.0)
MCH: 31.5 pg (ref 26.0–34.0)
MCHC: 33.8 g/dL (ref 30.0–36.0)
MCV: 93.2 fL (ref 80.0–100.0)
Platelets: 244 10*3/uL (ref 150–400)
RBC: 4.1 MIL/uL (ref 3.87–5.11)
RDW: 13 % (ref 11.5–15.5)
WBC: 8.9 10*3/uL (ref 4.0–10.5)
nRBC: 0 % (ref 0.0–0.2)

## 2022-05-31 LAB — BASIC METABOLIC PANEL
Anion gap: 5 (ref 5–15)
BUN: 19 mg/dL (ref 8–23)
CO2: 28 mmol/L (ref 22–32)
Calcium: 9.1 mg/dL (ref 8.9–10.3)
Chloride: 108 mmol/L (ref 98–111)
Creatinine, Ser: 0.95 mg/dL (ref 0.44–1.00)
GFR, Estimated: >60 mL/min (ref >60)
Glucose, Bld: 91 mg/dL (ref 70–99)
Potassium: 2.9 mmol/L — ABNORMAL LOW (ref 3.5–5.1)
Sodium: 141 mmol/L (ref 135–145)

## 2022-05-31 LAB — TROPONIN I (HIGH SENSITIVITY)
Troponin I (High Sensitivity): 5 ng/L (ref <18)
Troponin I (High Sensitivity): 5 ng/L (ref <18)

## 2022-05-31 LAB — MAGNESIUM: Magnesium: 1.7 mg/dL (ref 1.7–2.4)

## 2022-05-31 MED ORDER — POTASSIUM CHLORIDE CRYS ER 20 MEQ PO TBCR
40.0000 meq | EXTENDED_RELEASE_TABLET | Freq: Once | ORAL | Status: AC
  Administered 2022-05-31: 40 meq via ORAL
  Filled 2022-05-31: qty 2

## 2022-05-31 MED ORDER — ISOSORBIDE MONONITRATE ER 60 MG PO TB24: 60.0000 mg | ORAL_TABLET | Freq: Every day | ORAL | 0 refills | Status: DC

## 2022-05-31 MED ORDER — METOPROLOL SUCCINATE ER 50 MG PO TB24: 50.0000 mg | ORAL_TABLET | Freq: Every day | ORAL | 0 refills | Status: DC

## 2022-05-31 MED ORDER — RANOLAZINE ER 500 MG PO TB12: 500.0000 mg | ORAL_TABLET | Freq: Two times a day (BID) | ORAL | 3 refills | Status: DC

## 2022-05-31 NOTE — ED Notes (Signed)
Lab notified of mag add on

## 2022-05-31 NOTE — ED Provider Notes (Signed)
[facility] Provider Note   CSN: [id number] Arrival date & time: 05/31/22  1452     History {Add pertinent medical, surgical, social history, OB history to HPI:1} Chief Complaint  Patient presents with   Chest Pain    Heidi Franklin is a 76 y.o. female who presents today for evaluation of substernal chest pain. Her symptoms started multiple weeks ago but she has noticed over the past 2 weeks they have becoming more frequent and lasting longer.  She states that the pain is also becoming slightly more severe. Last time she had pain was 2 days ago on Wednesday.  She states that on average she is now having this pain for about 10 minutes 2-3 times a day.  She has had this pain both at rest and with exertion.  Her pain feels like a pressure, is nonradiating.  She denies any other symptoms during this including no nausea, shortness of breath, diaphoresis.  She denies any leg swelling.  Her pain is not aggravated or alleviated by oral intake.  She denies any sick contacts. She does have a prescription for nitro but has not tried that during any of these episodes.  Yesterday she had reached out to her cardiologist about the same.  They ordered ranolazine which she has not yet picked up, recommended that she go to the emergency room for evaluation and set up a nurse visit in 1 week for a EKG to be completed.   HPI     Home Medications Prior to Admission medications   Medication Sig Start Date End Date Taking? Authorizing Provider  acetaminophen (TYLENOL) 325 MG tablet Take 650 mg by mouth every 6 (six) hours as needed for mild pain or fever.    [provider]  albuterol (VENTOLIN HFA) 108 (90 Base) MCG/ACT inhaler INHALE 1-2 PUFFS INTO THE LUNGS EVERY 6 (SIX) HOURS AS NEEDED FOR WHEEZING OR SHORTNESS OF BREATH. 06/21/21   [name], MD  ALPRAZolam (XANAX) 0.5 MG tablet TAKE 1 TABLET BY MOUTH 2 TIMES DAILY AS NEEDED FOR ANXIETY. 08/10/21   [name], MD  amLODipine (NORVASC) 10 MG tablet TAKE 1 TABLET BY MOUTH EVERY DAY 01/14/22   [name], MD  aspirin 81 MG chewable tablet Chew 81 mg by mouth daily.    [provider]  betamethasone dipropionate (DIPROLENE) 0.05 % ointment Apply to affected areas twice a day and then as needed flare/itch 01/13/20   [provider]  Blood Pressure Monitoring (ADULT BLOOD PRESSURE CUFF LG) KIT Check blood pressure as needed. 05/01/22   [name], MD  Cholecalciferol (VITAMIN D3) 50 MCG (2000 UT) capsule Take 2,000 Units by mouth daily.    [provider]  famotidine (PEPCID) 40 MG tablet TAKE 1 TABLET BY MOUTH EVERY DAY 02/25/22   [name], MD  fluticasone (CUTIVATE) 0.05 % cream APPLY TO AFFECTED AREA TWICE A DAY 1 WEEK ON 1 WEEK OFF PRN FLARE 01/29/20   [provider]  fluticasone (FLONASE) 50 MCG/ACT nasal spray SPRAY 2 SPRAYS INTO EACH NOSTRIL EVERY DAY 01/07/22   [name], MD  glimepiride (AMARYL) 1 MG tablet TAKE 1 TABLET BY MOUTH EVERY DAY WITH BREAKFAST 04/10/22   [name], MD  glucose blood (ONE TOUCH ULTRA TEST) test strip 1 each by Other route daily. Use 1 strips to check blood sugar twice a day Ex E11.9 05/26/19   [name], MD  HUMIRA PEN-PS/UV/ADOL HS  START 40 MG/0.8ML PNKT Every 2 weeks 01/27/20   [provider]  hydrochlorothiazide (HYDRODIURIL) 25 MG tablet Take 1 tablet (25 mg total) by mouth daily. 05/01/22   [name], MD  HYDROcodone bit-homatropine (HYCODAN) 5-1.5 MG/5ML syrup Take 5 mLs by mouth at bedtime as needed for cough. Patient taking differently: Take 5 mLs by mouth at bedtime as needed for cough. As needed 09/24/21   [name], MD  HYDROcodone bit-homatropine (HYCODAN) 5-1.5 MG/5ML syrup Take 5 mLs by mouth every 8 (eight) hours as needed for cough. 03/06/22   Saguier, Percell Miller, PA-C  isosorbide mononitrate (IMDUR) 30 MG 24 hr tablet Take 1 tablet (30 mg total) by mouth daily. 02/26/22   Richardo Priest, MD  ketoconazole (NIZORAL) 2 % cream     [provider]  Lancets (ONETOUCH ULTRASOFT) lancets PRN 05/26/19   [name], MD  levocetirizine (XYZAL) 5 MG tablet TAKE 1 TABLET BY MOUTH EVERY DAY IN THE EVENING 05/02/22   [name], MD  metoprolol succinate (TOPROL XL) 25 MG 24 hr tablet Take 1 tablet (25 mg total) by mouth daily. 12/10/21   Richardo Priest, MD  mometasone (ELOCON) 0.1 % ointment  05/16/16   [provider]  montelukast (SINGULAIR) 10 MG tablet TAKE 1 TABLET BY MOUTH EVERYDAY AT BEDTIME 03/22/22   [name], MD  Multiple Vitamins-Minerals (CENTRUM SILVER PO) Take 1 tablet by mouth every morning.    [provider]  nitroGLYCERIN (NITROSTAT) 0.4 MG SL tablet Place 1 tablet (0.4 mg total) under the tongue every 5 (five) minutes as needed. 11/20/21 02/26/22  Richardo Priest, MD  pantoprazole (PROTONIX) 40 MG tablet Take 1 tablet (40 mg total) by mouth daily. 04/11/22   Irene Shipper, MD  Potassium Chloride ER 20 MEQ TBCR TAKE 1 TABLET BY MOUTH EVERY DAY 01/28/22   [name], MD  ranolazine (RANEXA) 500 MG 12 hr tablet Take 1 tablet (500 mg total) by mouth 2 (two) times daily. 05/31/22   Richardo Priest, MD  rosuvastatin (CRESTOR) 20 MG tablet Take 0.5 tablets (10 mg total) by mouth daily. 12/10/21 03/10/22  Richardo Priest, MD  tiZANidine (ZANAFLEX) 2 MG tablet Take 0.5-2 tablets (1-4 mg total) by mouth every 8 (eight) hours as needed for muscle spasms. 09/24/21   [name], MD  valACYclovir (VALTREX) 500 MG tablet Take 500 mg by mouth 2 (two) times daily. 07/30/18   [provider]      Allergies    Metformin and related and Penicillins    Review of Systems   Review of Systems  Physical Exam Updated Vital Signs BP (!) 125/59   Pulse 73   Temp 97.7 F (36.5 C) (Oral)   Resp 17   Ht 5' 1"  (1.549 m)   Wt 70.8 kg   SpO2 99%   BMI 29.48 kg/m  Physical Exam Vitals and nursing note reviewed.  Constitutional:       General: She is not in acute distress.    Appearance: She is not ill-appearing.  HENT:     Head: Atraumatic.  Eyes:     Conjunctiva/sclera: Conjunctivae normal.  Cardiovascular:     Rate and Rhythm: Normal rate.     Pulses:          Radial pulses are 2+ on the right side and 2+ on the left side.       Dorsalis pedis pulses are 2+ on the right  side and 2+ on the left side.       Posterior tibial pulses are 2+ on the right side and 2+ on the left side.     Heart sounds: Normal heart sounds.  Pulmonary:     Effort: Pulmonary effort is normal. No respiratory distress.     Breath sounds: No decreased breath sounds.  Chest:     Chest wall: No tenderness.  Abdominal:     General: There is no distension.     Tenderness: There is no abdominal tenderness.  Musculoskeletal:     Cervical back: Normal range of motion and neck supple.     Right lower leg: No tenderness. No edema.     Left lower leg: No tenderness. No edema.     Comments: No obvious acute injury  Skin:    General: Skin is warm.  Neurological:     General: No focal deficit present.     Mental Status: She is alert.     Comments: Awake and alert, answers all questions appropriately.  Speech is not slurred.  Psychiatric:        Mood and Affect: Mood normal.        Behavior: Behavior normal.     ED Results / Procedures / Treatments   Labs (all labs ordered are listed, but only abnormal results are displayed) Labs Reviewed  BASIC METABOLIC PANEL - Abnormal; Notable for the following components:      Result Value   Potassium 2.9 (*)    All other components within normal limits  CBC  MAGNESIUM  TROPONIN I (HIGH SENSITIVITY)    EKG EKG Interpretation  Date/Time:  Friday May 31 2022 15:04:04 EDT Ventricular Rate:  81 PR Interval:  155 QRS Duration: 84 QT Interval:  381 QTC Calculation: 443 R Axis:   15 Text Interpretation: Sinus rhythm Confirmed by Octaviano Glow [phone] on 05/31/2022 3:47:48 PM  Radiology DG  Chest 2 View  Result Date: 05/31/2022 CLINICAL DATA:  Chest pain. EXAM: CHEST - 2 VIEW COMPARISON:  Chest x-ray 10/18/2021. FINDINGS: The heart size and mediastinal contours are within normal limits. Both lungs are clear. No visible pleural effusions or pneumothorax. No acute osseous abnormality. IMPRESSION: No active cardiopulmonary disease. Electronically Signed   By: Margaretha Sheffield M.D.   On: 05/31/2022 15:41    Procedures Procedures  {Document cardiac monitor, telemetry assessment procedure when appropriate:1}  Medications Ordered in ED Medications  potassium chloride SA (KLOR-CON M) CR tablet 40 mEq (40 mEq Oral Given 05/31/22 1604)    ED Course/ Medical Decision Making/ A&P                           Medical Decision Making Amount and/or Complexity of Data Reviewed Labs: ordered. Radiology: ordered.  Risk Prescription drug management.   ***  {Document critical care time when appropriate:1} {Document review of labs and clinical decision tools ie heart score, Chads2Vasc2 etc:1}  {Document your independent review of radiology images, and any outside records:1} {Document your discussion with family members, caretakers, and with consultants:1} {Document social determinants of health affecting pt's care:1} {Document your decision making why or why not admission, treatments were needed:1} Final Clinical Impression(s) / ED Diagnoses Final diagnoses:  None    Rx / DC Orders ED Discharge Orders     None

## 2022-05-31 NOTE — ED Notes (Signed)
Pt states she has been having chest pressure for around 6 month. Cardiologist put pt on medication. Pt states it has gotten worse in the last week.

## 2022-05-31 NOTE — Telephone Encounter (Signed)
Called patient and she reported that she was having a pulling sensation in her chest for 2 weeks and recently started having some mid-sternal chest pressure. Her symptoms come and go and are now happening more frequently. The last time she had the chest pressure was yesterday and earlier today. Spoke with Dr. Bettina Gavia regarding this patient's symptoms and he recommended that she go to the ER to be evaluated. He also ordered Ranolazine and set-up a nurse visit for 1 week to have an EKG done. Patient is aware of these orders and stated that she would go to the ER. Patient had no further questions at this time.

## 2022-05-31 NOTE — ED Triage Notes (Signed)
Pt states intermittent chest pain x 1 week, called cardiology and was told to come to ER  Central chest presssure, denies any associated symptoms  Denies pain at this time

## 2022-05-31 NOTE — Discharge Instructions (Addendum)
Today we discussed options for admission for stress testing and continued monitoring versus discharge home with medication changes.  The recommendation is to increase your Imdur from 30 mg to 60 mg daily. Additionally recommend increasing your metoprolol from 25 mg daily to 50 mg daily.  According to our records your current Imdur dosing is 130 mg tablet.  To get to 60 mg you can accomplish this by taking a total of 2 of the 30 mg tablets. Also according to our records your metoprolol tablets are 25 mg, you can get to 50 by taking 2 of the 25 mg tablets a day.  If you do not wish to double up these medications I have given you prescriptions for 60 mg of Imdur and 50 mg of metoprolol.  Please make sure you are not taking the different strengths at the same time. I have given you these prescriptions for 14 days worth of medications.  Please make sure you get your medications changed by your primary care team. You may wish to increase your Imdur dose starting tomorrow to ensure you tolerate this increase and wait until Sunday to increase your metoprolol if you tolerate the increased indoor well without difficulty.  Changes to your blood pressure medication can sometimes make you lightheaded or dizzy. This may be more noticeable with position changes such as going from laying to sitting, and sitting to standing. Please give your body extra time to adjust to these changes to avoid falling.   If you have another episode of chest pain, or develop any new or concerning symptoms please seek additional medical care and evaluation. Please follow-up with your cardiologist. While in the emergency room your potassium was slightly low.  Please follow-up with your cardiologist or primary care doctor in the next week to get this rechecked.

## 2022-05-31 NOTE — ED Notes (Signed)
ED Provider at bedside. 

## 2022-06-03 ENCOUNTER — Telehealth: Payer: Self-pay | Admitting: Cardiology

## 2022-06-03 ENCOUNTER — Encounter: Payer: Self-pay | Admitting: Family Medicine

## 2022-06-03 DIAGNOSIS — E876 Hypokalemia: Secondary | ICD-10-CM

## 2022-06-03 NOTE — Telephone Encounter (Signed)
Pt called and stated that she was seen in the ED department on 08/04 and was told to contact her cardiology office today, Monday 08/07. Pt stated that she was started on Imdur 60 mg po qd and Toprol 50 mg po qd. Pt stated Dr. Bettina Gavia started her on Ranexa 500 mg tablets on 08/04 also, but is unable to pick up medication as medication cost over $300.   Pt would like to have an appt scheduled for hosp f/u. Will route to provider for review/recommendations and scheduler.

## 2022-06-03 NOTE — Telephone Encounter (Signed)
Patient states she went to the ED and was told the doctor there spoke with someone about changing her medications. She would like to know what to do next.

## 2022-06-04 ENCOUNTER — Other Ambulatory Visit (INDEPENDENT_AMBULATORY_CARE_PROVIDER_SITE_OTHER): Payer: Medicare Other

## 2022-06-04 ENCOUNTER — Encounter: Payer: Self-pay | Admitting: Cardiology

## 2022-06-04 ENCOUNTER — Ambulatory Visit (HOSPITAL_BASED_OUTPATIENT_CLINIC_OR_DEPARTMENT_OTHER)
Admission: RE | Admit: 2022-06-04 | Discharge: 2022-06-04 | Disposition: A | Discharge status: Home / Self Care | Payer: Medicare Other | Source: Ambulatory Visit | Attending: Family Medicine | Admitting: Family Medicine

## 2022-06-04 ENCOUNTER — Ambulatory Visit (INDEPENDENT_AMBULATORY_CARE_PROVIDER_SITE_OTHER): Payer: Medicare Other | Admitting: Cardiology

## 2022-06-04 ENCOUNTER — Other Ambulatory Visit: Payer: Medicare Other

## 2022-06-04 VITALS — BP 110/60 | HR 86 | Ht 60.0 in | Wt 154.1 lb

## 2022-06-04 DIAGNOSIS — E1169 Type 2 diabetes mellitus with other specified complication: Secondary | ICD-10-CM

## 2022-06-04 DIAGNOSIS — Z780 Asymptomatic menopausal state: Secondary | ICD-10-CM | POA: Diagnosis not present

## 2022-06-04 DIAGNOSIS — E785 Hyperlipidemia, unspecified: Secondary | ICD-10-CM | POA: Diagnosis not present

## 2022-06-04 DIAGNOSIS — E876 Hypokalemia: Secondary | ICD-10-CM

## 2022-06-04 DIAGNOSIS — I25118 Atherosclerotic heart disease of native coronary artery with other forms of angina pectoris: Secondary | ICD-10-CM | POA: Diagnosis not present

## 2022-06-04 DIAGNOSIS — R053 Chronic cough: Secondary | ICD-10-CM

## 2022-06-04 DIAGNOSIS — I10 Essential (primary) hypertension: Secondary | ICD-10-CM

## 2022-06-04 MED ORDER — METOPROLOL SUCCINATE ER 50 MG PO TB24: 50.0000 mg | ORAL_TABLET | Freq: Every day | ORAL | 3 refills | Status: DC

## 2022-06-04 MED ORDER — ISOSORBIDE MONONITRATE ER 30 MG PO TB24: 30.0000 mg | ORAL_TABLET | Freq: Every day | ORAL | 3 refills | Status: DC

## 2022-06-04 NOTE — Patient Instructions (Signed)
Medication Instructions:  Your physician has recommended you make the following change in your medication:   STOP: Ranexa STOP: Hydrochlorothiazide START: Imdur 30 mg daily START: Toprol XL 50 mg daily  *If you need a refill on your cardiac medications before your next appointment, please call your pharmacy*   Lab Work: None If you have labs (blood work) drawn today and your tests are completely normal, you will receive your results only by: [place] (if you have MyChart) OR A paper copy in the mail If you have any lab test that is abnormal or we need to change your treatment, we will call you to review the results.   Testing/Procedures: None   Follow-Up: At [facility], you and your health needs are our priority.  As part of our continuing mission to provide you with exceptional heart care, we have created designated Provider Care Teams.  These Care Teams include your primary Cardiologist (physician) and Advanced Practice Providers (APPs -  Physician Assistants and Nurse Practitioners) who all work together to provide you with the care you need, when you need it.  We recommend signing up for the patient portal called "MyChart".  Sign up information is provided on this After Visit Summary.  MyChart is used to connect with patients for Virtual Visits (Telemedicine).  Patients are able to view lab/test results, encounter notes, upcoming appointments, etc.  Non-urgent messages can be sent to your provider as well.   To learn more about what you can do with MyChart, go to [email or web address].    Your next appointment:   3 month(s)  The format for your next appointment:   In Person  Provider:   Jenne Campus, MD    Other Instructions None  Important Information About Sugar

## 2022-06-04 NOTE — Progress Notes (Signed)
Cardiology Office Note:    Date:  06/04/2022   ID:  Heidi Franklin, Heidi Franklin 31-Jul-1946, MRN [id number]  PCP:  Heidi Lukes, MD  Cardiologist:  Heidi More, MD    Referring MD: Heidi Lukes, MD    ASSESSMENT:    1. Coronary artery disease of native artery of native heart with stable angina pectoris ([place])   2. Essential hypertension   3. Hyperlipidemia associated with type 2 diabetes mellitus ([place])   4. Chronic cough    PLAN:    In order of problems listed above:  Heidi Franklin symptoms prompting the call to our office with chest pain are nonanginal in nature.  I would continue Heidi Franklin current medical regimen including aspirin oral nitrate beta-blocker calcium channel blocker and lipid-lowering with statin.  She may benefit from further evaluation with Heidi Franklin chronic cough and reflux with endoscopy.  At this time I do not think she needs to initiate ranolazine. Well-controlled stop Heidi Franklin thiazide diuretic with severe hypokalemia await labs drawn today Continue Heidi Franklin high intensity statin which is effectively controlled Heidi Franklin lipid disorder   Next appointment: 9 months   Medication Adjustments/Labs and Tests Ordered: Current medicines are reviewed at length with the patient today.  Concerns regarding medicines are outlined above.  No orders of the defined types were placed in this encounter.  Meds ordered this encounter  Medications   metoprolol succinate (TOPROL-XL) 50 MG 24 hr tablet    Sig: Take 1 tablet (50 mg total) by mouth daily. Take with or immediately following a meal.    Dispense:  90 tablet    Refill:  3   isosorbide mononitrate (IMDUR) 30 MG 24 hr tablet    Sig: Take 1 tablet (30 mg total) by mouth daily.    Dispense:  90 tablet    Refill:  3    Chief complaint follow-up from ED visit   History of Present Illness:    Heidi Franklin is a 76 y.o. female with a hx of hyperlipidemia hypertension type 2 diabetes mellitus and angina pectoris with normal coronary myocardial perfusion  study.  Heidi Franklin cardiac CTA shows mild CAD  She underwent cardiac CTA 01/01/2022 coronary calcium score was quite elevated 267/83rd percentile and she had moderate CAD noted in the left anterior descending and ramus branch.  Stenosis left anterior descending coronary artery 50 to 69% mid vessel ramus branch 25 to 49% ostial first diagonal less than 25% left circumflex less than 25% right coronary artery less than 25% stenosis.  She was last seen 02/26/2022 and had a recent ED visit after calling our office complaining of ongoing chest discomfort.  Compliance with diet, lifestyle and medications: Yes  She was seen by me in follow-up to Heidi Franklin ED visit In retrospect she was not having severe chest pain she was having vague nonexertional nonanginal discomfort under Heidi Franklin sternum and Heidi Franklin predominant problem is 1 of chronic cough and she tells me she has been seen by pulmonary ENT and takes a PPI. Initially I recommended ranolazine she never started it due to cost She had a low potassium in the emergency room 2.9 was given a couple of tablets of potassium to take there and continue taking hydrochlorothiazide. I asked Heidi Franklin to stop Heidi Franklin thiazide diuretic as Heidi Franklin blood pressure is well controlled With ongoing cough history of reflux and ongoing symptoms of nonanginal chest pain may benefit from GI evaluation even endoscopy Past Medical History:  Diagnosis Date   ALLERGIC RHINITIS    Allergy  Anxiety state, unspecified    Arthritis    Asthma    BRONCHITIS, CHRONIC    Cataract    b/l   Chronic low back pain 10/09/2016   DEPRESSION    DIABETES MELLITUS, TYPE II    GERD    HYPERLIPIDEMIA    HYPERTENSION    Pneumonia    Psoriasis     Past Surgical History:  Procedure Laterality Date   ABDOMINAL HYSTERECTOMY  1980   CARPAL TUNNEL RELEASE Right    COLONOSCOPY     TRIGGER FINGER RELEASE Left    Index finger   UPPER GASTROINTESTINAL ENDOSCOPY      Current Medications: Current Meds  Medication Sig    acetaminophen (TYLENOL) 325 MG tablet Take 650 mg by mouth every 6 (six) hours as needed for mild pain or fever.   albuterol (VENTOLIN HFA) 108 (90 Base) MCG/ACT inhaler INHALE 1-2 PUFFS INTO THE LUNGS EVERY 6 (SIX) HOURS AS NEEDED FOR WHEEZING OR SHORTNESS OF BREATH.   ALPRAZolam (XANAX) 0.5 MG tablet TAKE 1 TABLET BY MOUTH 2 TIMES DAILY AS NEEDED FOR ANXIETY.   amLODipine (NORVASC) 10 MG tablet TAKE 1 TABLET BY MOUTH EVERY DAY   aspirin 81 MG chewable tablet Chew 81 mg by mouth daily.   betamethasone dipropionate (DIPROLENE) 0.05 % ointment Apply to affected areas twice a day and then as needed flare/itch   Blood Pressure Monitoring (ADULT BLOOD PRESSURE CUFF LG) KIT Check blood pressure as needed.   Cholecalciferol (VITAMIN D3) 50 MCG (2000 UT) capsule Take 2,000 Units by mouth daily.   famotidine (PEPCID) 40 MG tablet TAKE 1 TABLET BY MOUTH EVERY DAY   fluticasone (CUTIVATE) 0.05 % cream APPLY TO AFFECTED AREA TWICE A DAY 1 WEEK ON 1 WEEK OFF PRN FLARE   fluticasone (FLONASE) 50 MCG/ACT nasal spray SPRAY 2 SPRAYS INTO EACH NOSTRIL EVERY DAY   glimepiride (AMARYL) 1 MG tablet TAKE 1 TABLET BY MOUTH EVERY DAY WITH BREAKFAST   glucose blood (ONE TOUCH ULTRA TEST) test strip 1 each by Other route daily. Use 1 strips to check blood sugar twice a day Ex E11.9   HUMIRA PEN-PS/UV/ADOL HS START 40 MG/0.8ML PNKT Every 2 weeks   HYDROcodone bit-homatropine (HYCODAN) 5-1.5 MG/5ML syrup Take 5 mLs by mouth at bedtime as needed for cough.   HYDROcodone bit-homatropine (HYCODAN) 5-1.5 MG/5ML syrup Take 5 mLs by mouth every 8 (eight) hours as needed for cough.   isosorbide mononitrate (IMDUR) 30 MG 24 hr tablet Take 1 tablet (30 mg total) by mouth daily.   ketoconazole (NIZORAL) 2 % cream    Lancets (ONETOUCH ULTRASOFT) lancets PRN   levocetirizine (XYZAL) 5 MG tablet TAKE 1 TABLET BY MOUTH EVERY DAY IN THE EVENING   metoprolol succinate (TOPROL-XL) 50 MG 24 hr tablet Take 1 tablet (50 mg total) by mouth  daily. Take with or immediately following a meal.   mometasone (ELOCON) 0.1 % ointment    montelukast (SINGULAIR) 10 MG tablet TAKE 1 TABLET BY MOUTH EVERYDAY AT BEDTIME   Multiple Vitamins-Minerals (CENTRUM SILVER PO) Take 1 tablet by mouth every morning.   pantoprazole (PROTONIX) 40 MG tablet Take 1 tablet (40 mg total) by mouth daily.   Potassium Chloride ER 20 MEQ TBCR TAKE 1 TABLET BY MOUTH EVERY DAY   rosuvastatin (CRESTOR) 20 MG tablet Take 10 mg by mouth daily.   tiZANidine (ZANAFLEX) 2 MG tablet Take 0.5-2 tablets (1-4 mg total) by mouth every 8 (eight) hours as needed for muscle spasms.  valACYclovir (VALTREX) 500 MG tablet Take 500 mg by mouth 2 (two) times daily.   [DISCONTINUED] hydrochlorothiazide (HYDRODIURIL) 25 MG tablet Take 1 tablet (25 mg total) by mouth daily.   [DISCONTINUED] metoprolol succinate (TOPROL XL) 50 MG 24 hr tablet Take 1 tablet (50 mg total) by mouth daily.   [DISCONTINUED] ranolazine (RANEXA) 500 MG 12 hr tablet Take 1 tablet (500 mg total) by mouth 2 (two) times daily.     Allergies:   Metformin and related and Penicillins   Social History   Socioeconomic History   Marital status: Married    Spouse name: Not on file   Number of children: 0   Years of education: 79yr[employer]ge   Highest education level: Not on file  Occupational History   Occupation: Retired   Tobacco Use   Smoking status: Never    Passive exposure: Never   Smokeless tobacco: Never  Vaping Use   Vaping Use: Never used  Substance and Sexual Activity   Alcohol use: No    Alcohol/week: 0.0 standard drinks of alcohol   Drug use: No   Sexual activity: Not Currently  Other Topics Concern   Not on file  Social History Narrative   Married, lives with spouse-[facility] [employer]   Not employed    Drinks coffee occasional, Consumes 1 soda a day    No dietary restrictions   Social Determinants of Health   Financial Resource Strain: Low Risk  (04/25/2021)   Overall Financial  Resource Strain (CARDIA)    Difficulty of Paying Living Expenses: Not very hard  Food Insecurity: No Food Insecurity (04/25/2021)   Hunger Vital Sign    Worried About Running Out of Food in the Last Year: Never true    [place] the Last Year: Never true  Transportation Needs: No Transportation Needs (04/25/2021)   PRAPARE - THydrologist(Medical): No    Lack of Transportation (Non-Medical): No  Physical Activity: Inactive (04/25/2021)   Exercise Vital Sign    Days of Exercise per Week: 0 days    Minutes of Exercise per Session: 0 min  Stress: Stress Concern Present (04/25/2021)   [place]   Feeling of Stress : To some extent  Social Connections: Socially Integrated (04/25/2021)   Social Connection and Isolation Panel [NHANES]    Frequency of Communication with Friends and Family: [place] than three times a week    Frequency of Social Gatherings with Friends and Family: [place] than three times a week    Attends Religious Services: [place] than 4 times per year    Active Member of [employer] Organizations: Yes    Attends CArchivistMeetings: 1 to 4 times per year    Marital Status: Married     Family History: The patient's family history includes Angina in Heidi Franklin mother; Arthritis in Heidi Franklin sister; CAD in Heidi Franklin sister and sister; COPD in Heidi Franklin sister; Depression in Heidi Franklin mother; Diabetes in Heidi Franklin brother, brother, father, and sister; Heart disease in Heidi Franklin mother; Hyperlipidemia in Heidi Franklin brother, brother, mother, sister, sister, and another family member; Hypertension in Heidi Franklin brother, brother, mother, sister, sister, and another family member; Stroke in Heidi Franklin mother. There is no history of Colon cancer, Esophageal cancer, Rectal cancer, Stomach cancer, or Pancreatic cancer. ROS:   Please see the history of present illness.    All other systems reviewed and are negative.  EKGs/Labs/Other Studies Reviewed:  The following studies were reviewed today:    Recent Labs: 03/26/2022: ALT 30; TSH 3.95 05/31/2022: BUN 19; Creatinine, Ser 0.95; Hemoglobin 12.9; Magnesium 1.7; Platelets 244; Potassium 2.9; Sodium 141  Recent Lipid Panel    Component Value Date/Time   CHOL 159 03/26/2022 0934   TRIG 62.0 03/26/2022 0934   HDL 46.80 03/26/2022 0934   CHOLHDL 3 03/26/2022 0934   VLDL 12.4 03/26/2022 0934   LDLCALC 100 (H) 03/26/2022 0934   LDLDIRECT 155.1 02/09/2013 1113    Physical Exam:    VS:  BP 110/60   Pulse 86   Ht 5' (1.524 m)   Wt 154 lb 1.3 oz (69.9 kg)   SpO2 99%   BMI 30.09 kg/m     Wt Readings from Last 3 Encounters:  06/04/22 154 lb 1.3 oz (69.9 kg)  05/31/22 156 lb (70.8 kg)  03/26/22 154 lb 12.8 oz (70.2 kg)     GEN:  Well nourished, well developed in no acute distress HEENT: Normal NECK: No JVD; No carotid bruits LYMPHATICS: No lymphadenopathy CARDIAC: RRR, no murmurs, rubs, gallops RESPIRATORY:  Clear to auscultation without rales, wheezing or rhonchi  ABDOMEN: Soft, non-tender, non-distended MUSCULOSKELETAL:  No edema; No deformity  SKIN: Warm and dry NEUROLOGIC:  Alert and oriented x 3 PSYCHIATRIC:  Normal affect    Signed, Heidi More, MD  06/04/2022 5:07 PM    [place]

## 2022-06-05 ENCOUNTER — Other Ambulatory Visit: Payer: Self-pay | Admitting: Family Medicine

## 2022-06-05 ENCOUNTER — Encounter: Payer: Self-pay | Admitting: Family Medicine

## 2022-06-05 LAB — POTASSIUM: Potassium: 3.8 mEq/L (ref 3.5–5.1)

## 2022-06-05 NOTE — Telephone Encounter (Signed)
Requesting: alprazolam 0.'5mg'$   Contract: None UDS: None Last Visit: 03/26/22 Next Visit: 06/11/22 Last Refill: 08/10/21 #60 and 1RF  Please Advise

## 2022-06-06 ENCOUNTER — Other Ambulatory Visit: Payer: Self-pay | Admitting: Family Medicine

## 2022-06-07 ENCOUNTER — Ambulatory Visit: Payer: Medicare Other

## 2022-06-07 NOTE — Addendum Note (Signed)
Addended by: Edwyna Shell I on: 06/07/2022 01:46 PM   Modules accepted: Orders

## 2022-06-07 NOTE — Progress Notes (Signed)
   Nurse Visit   Date of Encounter: 06/07/2022 ID: MYKEL MOHL, DOB Jan 22, 1946, MRN [id number]  PCP:  [name], MD   [facility] Providers Cardiologist:  Dr. Barbaraann Rondo to update primary MD,subspecialty MD or APP then REFRESH:1}     Visit Details   VS:  BP 110/68 (BP Location: Left Arm, Patient Position: Sitting, Cuff Size: Normal)   Pulse 78   Ht 5' (1.524 m)   Wt 144 lb 1.9 oz (65.4 kg)   BMI 28.15 kg/m  , BMI Body mass index is 28.15 kg/m.  Wt Readings from Last 3 Encounters:  06/07/22 144 lb 1.9 oz (65.4 kg)  06/04/22 154 lb 1.3 oz (69.9 kg)  05/31/22 156 lb (70.8 kg)     Reason for visit: EKG Performed today: Vitals , EKG, Provider consulted and Education Changes (medications, testing, etc.) : No new orders Length of Visit: 20 minutes    Medications Adjustments/Labs and Tests Ordered: No orders of the defined types were placed in this encounter.  No orders of the defined types were placed in this encounter.    Signed, Louie Casa, RN  06/07/2022 10:36 AM

## 2022-06-11 ENCOUNTER — Other Ambulatory Visit: Payer: Self-pay

## 2022-06-11 ENCOUNTER — Ambulatory Visit (HOSPITAL_BASED_OUTPATIENT_CLINIC_OR_DEPARTMENT_OTHER)
Admission: RE | Admit: 2022-06-11 | Discharge: 2022-06-11 | Disposition: A | Discharge status: Home / Self Care | Payer: Medicare Other | Source: Ambulatory Visit | Attending: Family Medicine | Admitting: Family Medicine

## 2022-06-11 ENCOUNTER — Ambulatory Visit (INDEPENDENT_AMBULATORY_CARE_PROVIDER_SITE_OTHER): Payer: Medicare Other | Admitting: Family Medicine

## 2022-06-11 VITALS — BP 124/62 | HR 85 | Temp 97.9°F | Resp 16 | Ht 60.0 in | Wt 153.8 lb

## 2022-06-11 DIAGNOSIS — E1165 Type 2 diabetes mellitus with hyperglycemia: Secondary | ICD-10-CM | POA: Diagnosis not present

## 2022-06-11 DIAGNOSIS — I10 Essential (primary) hypertension: Secondary | ICD-10-CM | POA: Diagnosis not present

## 2022-06-11 DIAGNOSIS — F418 Other specified anxiety disorders: Secondary | ICD-10-CM

## 2022-06-11 DIAGNOSIS — R058 Other specified cough: Secondary | ICD-10-CM

## 2022-06-11 DIAGNOSIS — R194 Change in bowel habit: Secondary | ICD-10-CM | POA: Diagnosis not present

## 2022-06-11 DIAGNOSIS — E559 Vitamin D deficiency, unspecified: Secondary | ICD-10-CM | POA: Diagnosis not present

## 2022-06-11 DIAGNOSIS — M7552 Bursitis of left shoulder: Secondary | ICD-10-CM | POA: Diagnosis not present

## 2022-06-11 DIAGNOSIS — Z79899 Other long term (current) drug therapy: Secondary | ICD-10-CM

## 2022-06-11 DIAGNOSIS — R0789 Other chest pain: Secondary | ICD-10-CM

## 2022-06-11 DIAGNOSIS — R1013 Epigastric pain: Secondary | ICD-10-CM | POA: Diagnosis not present

## 2022-06-11 DIAGNOSIS — K219 Gastro-esophageal reflux disease without esophagitis: Secondary | ICD-10-CM

## 2022-06-11 DIAGNOSIS — I25118 Atherosclerotic heart disease of native coronary artery with other forms of angina pectoris: Secondary | ICD-10-CM

## 2022-06-11 DIAGNOSIS — J040 Acute laryngitis: Secondary | ICD-10-CM

## 2022-06-11 LAB — CBC WITH DIFFERENTIAL/PLATELET
Basophils Absolute: 0 10*3/uL (ref 0.0–0.1)
Basophils Relative: 0.4 % (ref 0.0–3.0)
Eosinophils Absolute: 0 10*3/uL (ref 0.0–0.7)
Eosinophils Relative: 0.5 % (ref 0.0–5.0)
HCT: 37.8 % (ref 36.0–46.0)
Hemoglobin: 12.3 g/dL (ref 12.0–15.0)
Lymphocytes Relative: 38.2 % (ref 12.0–46.0)
Lymphs Abs: 3.2 10*3/uL (ref 0.7–4.0)
MCHC: 32.5 g/dL (ref 30.0–36.0)
MCV: 95.6 fl (ref 78.0–100.0)
Monocytes Absolute: 0.7 10*3/uL (ref 0.1–1.0)
Monocytes Relative: 8.2 % (ref 3.0–12.0)
Neutro Abs: 4.4 10*3/uL (ref 1.4–7.7)
Neutrophils Relative %: 52.7 % (ref 43.0–77.0)
Platelets: 212 10*3/uL (ref 150.0–400.0)
RBC: 3.95 Mil/uL (ref 3.87–5.11)
RDW: 13.3 % (ref 11.5–15.5)
WBC: 8.3 10*3/uL (ref 4.0–10.5)

## 2022-06-11 LAB — COMPREHENSIVE METABOLIC PANEL
ALT: 16 U/L (ref 0–35)
AST: 18 U/L (ref 0–37)
Albumin: 4.1 g/dL (ref 3.5–5.2)
Alkaline Phosphatase: 45 U/L (ref 39–117)
BUN: 15 mg/dL (ref 6–23)
CO2: 28 mEq/L (ref 19–32)
Calcium: 9.4 mg/dL (ref 8.4–10.5)
Chloride: 100 mEq/L (ref 96–112)
Creatinine, Ser: 1.01 mg/dL (ref 0.40–1.20)
GFR: 53.79 mL/min — ABNORMAL LOW (ref >60.00)
Glucose, Bld: 114 mg/dL — ABNORMAL HIGH (ref 70–99)
Potassium: 3.5 mEq/L (ref 3.5–5.1)
Sodium: 138 mEq/L (ref 135–145)
Total Bilirubin: 0.4 mg/dL (ref 0.2–1.2)
Total Protein: 7.1 g/dL (ref 6.0–8.3)

## 2022-06-11 MED ORDER — DOXYCYCLINE HYCLATE 100 MG PO TABS: 100.0000 mg | ORAL_TABLET | Freq: Two times a day (BID) | ORAL | 0 refills | Status: DC

## 2022-06-11 MED ORDER — PROMETHAZINE-DM 6.25-15 MG/5ML PO SYRP
5.0000 mL | ORAL_SOLUTION | Freq: Two times a day (BID) | ORAL | 0 refills | Status: DC | PRN
Start: 2022-06-11 — End: 2022-11-18

## 2022-06-11 MED ORDER — PANTOPRAZOLE SODIUM 40 MG PO TBEC: 40.0000 mg | DELAYED_RELEASE_TABLET | Freq: Two times a day (BID) | ORAL | 1 refills | Status: DC

## 2022-06-11 MED ORDER — HYDROCODONE BIT-HOMATROP MBR 5-1.5 MG/5ML PO SOLN: 5.0000 mL | Freq: Every evening | ORAL | 0 refills | Status: DC | PRN

## 2022-06-11 NOTE — Progress Notes (Signed)
Subjective:   By signing my name below, I, Kellie Simmering, attest that this documentation has been prepared under the direction and in the presence of [name], MD 06/11/2022.     Patient ID: Heidi Franklin, female    DOB: 12-29-1945, 76 y.o.   MRN: [id number]  No chief complaint on file.  HPI Patient is in today for a hospital follow up.  Chest discomfort: She reports that she has been experiencing discomfort in her upper chest, which has been ongoing for 2 weeks, and has been coughing intensely for the past 2 days. She denies a sharp pain but describes a pressure that comes and goes. She states that there is not anything that worsens or improves the discomfort.  She denies abdominal pain, nausea, vomiting and shortness of breath. She reports that she has been consuming multiple packs of cough drops and has also been taking cough syrup to manage her cough. She reports that her cough is keeping her awake at night. She says that Albuterol provides her temporary relief. She is requesting a refill on her Promethazine DM  and Hydromet.   Bowel: She reports that she has noticed changes in her bowel movements. She states that she has experienced constipation and loss of control, experiencing this for the past few months. She states that she has been taking Miralax and fibre to manage this. She is currently taking Pantoprazole 40 mg in the morning and Famotidine 40 mg at night.   Appetite: She denies any changes to her appetite. She complains of GERD after eating. She states that she has attempted to reduce her portion sizes. She denies taking any probiotics.   ENT: She states that she saw an ENT a few years ago to examine her throat. She denies any complications with swallowing.   Blood pressure: She states that Dr. Bettina Gavia has told her to stop taking her Hydrochlorothiazide. Her blood pressure is within normal range today.  BP Readings from Last 3 Encounters:  06/11/22 124/62  06/07/22  110/68  06/04/22 110/60   Past Medical History:  Diagnosis Date   ALLERGIC RHINITIS    Allergy    Anxiety state, unspecified    Arthritis    Asthma    BRONCHITIS, CHRONIC    Cataract    b/l   Chronic low back pain 10/09/2016   DEPRESSION    DIABETES MELLITUS, TYPE II    GERD    HYPERLIPIDEMIA    HYPERTENSION    Pneumonia    Psoriasis    Past Surgical History:  Procedure Laterality Date   ABDOMINAL HYSTERECTOMY  1980   CARPAL TUNNEL RELEASE Right    COLONOSCOPY     TRIGGER FINGER RELEASE Left    Index finger   UPPER GASTROINTESTINAL ENDOSCOPY     Family History  Problem Relation Age of Onset   Stroke Mother    Angina Mother    Hyperlipidemia Mother    Hypertension Mother    Heart disease Mother    Depression Mother    Diabetes Father    Hyperlipidemia Other        Parent   Hypertension Other        Parent   Diabetes Sister        x1   CAD Sister    COPD Sister        smoker   Hyperlipidemia Sister    Hypertension Sister    Diabetes Brother        x 2  Hyperlipidemia Brother    Hypertension Brother    Diabetes Brother    Hyperlipidemia Brother    Hypertension Brother    CAD Sister    Arthritis Sister    Hyperlipidemia Sister    Hypertension Sister    Colon cancer Neg Hx    Esophageal cancer Neg Hx    Rectal cancer Neg Hx    Stomach cancer Neg Hx    Pancreatic cancer Neg Hx    Social History   Socioeconomic History   Marital status: Married    Spouse name: Not on file   Number of children: 0   Years of education: 26yrcolge   Highest education level: Not on file  Occupational History   Occupation: Retired   Tobacco Use   Smoking status: Never    Passive exposure: Never   Smokeless tobacco: Never  Vaping Use   Vaping Use: Never used  Substance and Sexual Activity   Alcohol use: No    Alcohol/week: 0.0 standard drinks of alcohol   Drug use: No   Sexual activity: Not Currently  Other Topics Concern   Not on file  Social History  Narrative   Married, lives with spouse-[facility]   Not employed    Drinks coffee occasional, Consumes 1 soda a day    No dietary restrictions   Social Determinants of Health   Financial Resource Strain: Low Risk  (04/25/2021)   Overall Financial Resource Strain (CARDIA)    Difficulty of Paying Living Expenses: Not very hard  Food Insecurity: No Food Insecurity (04/25/2021)   Hunger Vital Sign    Worried About Running Out of Food in the Last Year: Never true    [place] the Last Year: Never true  Transportation Needs: No Transportation Needs (04/25/2021)   PRAPARE - THydrologist(Medical): No    Lack of Transportation (Non-Medical): No  Physical Activity: Inactive (04/25/2021)   Exercise Vital Sign    Days of Exercise per Week: 0 days    Minutes of Exercise per Session: 0 min  Stress: Stress Concern Present (04/25/2021)   FVickery   Feeling of Stress : To some extent  Social Connections: Socially Integrated (04/25/2021)   Social Connection and Isolation Panel [NHANES]    Frequency of Communication with Friends and Family: More than three times a week    Frequency of Social Gatherings with Friends and Family: More than three times a week    Attends Religious Services: More than 4 times per year    Active Member of [employer] Organ[employer]: Yes    Attends CArchivistMeetings: 1 to 4 times per year    Marital Status: Married  IHuman resources officerViolence: Not At Risk (04/25/2021)   Humiliation, Afraid, Rape, and Kick questionnaire    Fear of Current or Ex-Partner: No    Emotionally Abused: No    Physically Abused: No    Sexually Abused: No   Outpatient Medications Prior to Visit  Medication Sig Dispense Refill   acetaminophen (TYLENOL) 325 MG tablet Take 650 mg by mouth every 6 (six) hours as needed for mild pain or fever.     albuterol (VENTOLIN HFA) 108  (90 Base) MCG/ACT inhaler INHALE 1-2 PUFFS INTO THE LUNGS EVERY 6 (SIX) HOURS AS NEEDED FOR WHEEZING OR SHORTNESS OF BREATH. 8.5 each 3   ALPRAZolam (XANAX) 0.5 MG tablet TAKE 1 TABLET BY  MOUTH TWICE A DAY AS NEEDED FOR ANXIETY 60 tablet 1   amLODipine (NORVASC) 10 MG tablet TAKE 1 TABLET BY MOUTH EVERY DAY 90 tablet 1   aspirin 81 MG chewable tablet Chew 81 mg by mouth daily.     betamethasone dipropionate (DIPROLENE) 0.05 % ointment Apply to affected areas twice a day and then as needed flare/itch     Blood Pressure Monitoring (ADULT BLOOD PRESSURE CUFF LG) KIT Check blood pressure as needed. 1 kit 0   Cholecalciferol (VITAMIN D3) 50 MCG (2000 UT) capsule Take 2,000 Units by mouth daily.     famotidine (PEPCID) 40 MG tablet TAKE 1 TABLET BY MOUTH EVERY DAY 90 tablet 1   fluticasone (CUTIVATE) 0.05 % cream APPLY TO AFFECTED AREA TWICE A DAY 1 WEEK ON 1 WEEK OFF PRN FLARE     fluticasone (FLONASE) 50 MCG/ACT nasal spray SPRAY 2 SPRAYS INTO EACH NOSTRIL EVERY DAY 48 mL 2   glimepiride (AMARYL) 1 MG tablet TAKE 1 TABLET BY MOUTH EVERY DAY WITH BREAKFAST 90 tablet 1   glucose blood (ONE TOUCH ULTRA TEST) test strip 1 each by Other route daily. Use 1 strips to check blood sugar twice a day Ex E11.9 100 each 3   HUMIRA PEN-PS/UV/ADOL HS START 40 MG/0.8ML PNKT Every 2 weeks     HYDROcodone bit-homatropine (HYCODAN) 5-1.5 MG/5ML syrup Take 5 mLs by mouth at bedtime as needed for cough. 120 mL 0   HYDROcodone bit-homatropine (HYCODAN) 5-1.5 MG/5ML syrup Take 5 mLs by mouth every 8 (eight) hours as needed for cough. 120 mL 0   isosorbide mononitrate (IMDUR) 30 MG 24 hr tablet Take 1 tablet (30 mg total) by mouth daily. 90 tablet 3   ketoconazole (NIZORAL) 2 % cream      Lancets (ONETOUCH ULTRASOFT) lancets PRN 100 each 3   levocetirizine (XYZAL) 5 MG tablet TAKE 1 TABLET BY MOUTH EVERY DAY IN THE EVENING 30 tablet 0   metoprolol succinate (TOPROL-XL) 50 MG 24 hr tablet Take 1 tablet (50 mg total) by mouth  daily. Take with or immediately following a meal. 90 tablet 3   mometasone (ELOCON) 0.1 % ointment      montelukast (SINGULAIR) 10 MG tablet TAKE 1 TABLET BY MOUTH EVERYDAY AT BEDTIME 90 tablet 1   Multiple Vitamins-Minerals (CENTRUM SILVER PO) Take 1 tablet by mouth every morning.     nitroGLYCERIN (NITROSTAT) 0.4 MG SL tablet Place 1 tablet (0.4 mg total) under the tongue every 5 (five) minutes as needed. 30 tablet 3   pantoprazole (PROTONIX) 40 MG tablet Take 1 tablet (40 mg total) by mouth daily. 90 tablet 0   Potassium Chloride ER 20 MEQ TBCR TAKE 1 TABLET BY MOUTH EVERY DAY 90 tablet 1   rosuvastatin (CRESTOR) 20 MG tablet Take 10 mg by mouth daily.     tiZANidine (ZANAFLEX) 2 MG tablet Take 0.5-2 tablets (1-4 mg total) by mouth every 8 (eight) hours as needed for muscle spasms. 40 tablet 1   valACYclovir (VALTREX) 500 MG tablet Take 500 mg by mouth 2 (two) times daily.  0   No facility-administered medications prior to visit.   Allergies  Allergen Reactions   Metformin And Related Diarrhea   Penicillins Rash   Review of Systems  Respiratory:  Positive for cough. Negative for shortness of breath.   Cardiovascular:        (+) Chest discomfort.  Gastrointestinal:  Positive for constipation. Negative for abdominal pain, nausea and vomiting.  Objective:    Physical Exam Constitutional:      General: She is not in acute distress.    Appearance: Normal appearance. She is not ill-appearing.  HENT:     Head: Normocephalic and atraumatic.     Right Ear: External ear normal.     Left Ear: External ear normal.     Mouth/Throat:     Mouth: Mucous membranes are moist.     Pharynx: Oropharynx is clear.     Comments: Throat is visibly red. Eyes:     Extraocular Movements: Extraocular movements intact.     Pupils: Pupils are equal, round, and reactive to light.  Cardiovascular:     Rate and Rhythm: Normal rate and regular rhythm.     Pulses: Normal pulses.     Heart sounds:  Normal heart sounds. No murmur heard.    No gallop.  Pulmonary:     Effort: Pulmonary effort is normal. No respiratory distress.     Breath sounds: Normal breath sounds. No wheezing or rales.  Skin:    General: Skin is warm and dry.  Neurological:     Mental Status: She is alert and oriented to person, place, and time.  Psychiatric:        Mood and Affect: Mood normal.        Behavior: Behavior normal.        Judgment: Judgment normal.     There were no vitals taken for this visit. Wt Readings from Last 3 Encounters:  06/07/22 144 lb 1.9 oz (65.4 kg)  06/04/22 154 lb 1.3 oz (69.9 kg)  05/31/22 156 lb (70.8 kg)   Diabetic Foot Exam - Simple   No data filed    Lab Results  Component Value Date   WBC 8.9 05/31/2022   HGB 12.9 05/31/2022   HCT 38.2 05/31/2022   PLT 244 05/31/2022   GLUCOSE 91 05/31/2022   CHOL 159 03/26/2022   TRIG 62.0 03/26/2022   HDL 46.80 03/26/2022   LDLDIRECT 155.1 02/09/2013   LDLCALC 100 (H) 03/26/2022   ALT 30 03/26/2022   AST 24 03/26/2022   NA 141 05/31/2022   K 3.8 06/04/2022   CL 108 05/31/2022   CREATININE 0.95 05/31/2022   BUN 19 05/31/2022   CO2 28 05/31/2022   TSH 3.95 03/26/2022   HGBA1C 6.7 (H) 03/26/2022   MICROALBUR 1.3 03/26/2022   Lab Results  Component Value Date   TSH 3.95 03/26/2022   Lab Results  Component Value Date   WBC 8.9 05/31/2022   HGB 12.9 05/31/2022   HCT 38.2 05/31/2022   MCV 93.2 05/31/2022   PLT 244 05/31/2022   Lab Results  Component Value Date   NA 141 05/31/2022   K 3.8 06/04/2022   CO2 28 05/31/2022   GLUCOSE 91 05/31/2022   BUN 19 05/31/2022   CREATININE 0.95 05/31/2022   BILITOT 0.3 03/26/2022   ALKPHOS 55 03/26/2022   AST 24 03/26/2022   ALT 30 03/26/2022   PROT 6.6 03/26/2022   ALBUMIN 3.7 03/26/2022   CALCIUM 9.1 05/31/2022   ANIONGAP 5 05/31/2022   EGFR 60 11/30/2021   GFR 64.44 03/26/2022   Lab Results  Component Value Date   CHOL 159 03/26/2022   Lab Results   Component Value Date   HDL 46.80 03/26/2022   Lab Results  Component Value Date   LDLCALC 100 (H) 03/26/2022   Lab Results  Component Value Date   TRIG 62.0 03/26/2022   Lab Results  Component Value Date   CHOLHDL 3 03/26/2022   Lab Results  Component Value Date   HGBA1C 6.7 (H) 03/26/2022      Assessment & Plan:   Problem List Items Addressed This Visit   None  No orders of the defined types were placed in this encounter.  I, Kellie Simmering, personally preformed the services described in this documentation.  All medical record entries made by the scribe were at my direction and in my presence.  I have reviewed the chart and discharge instructions (if applicable) and agree that the record reflects my personal performance and is accurate and complete. 06/11/2022  I,Mohammed Iqbal,acting as a scribe for Penni Homans, MD.,have documented all relevant documentation on the behalf of Penni Homans, MD,as directed by  Penni Homans, MD while in the presence of Penni Homans, MD.  Kellie Simmering

## 2022-06-11 NOTE — Patient Instructions (Signed)
Increase the Pantoprazole to twice daily Small meals, minimize fatty and spicy foods in pm especially In a couple days if cough not improving take Doxycycline and an OTC probiotic, [employer], Electronics engineer .Marland KitchenMarland Kitchen

## 2022-06-12 DIAGNOSIS — L400 Psoriasis vulgaris: Secondary | ICD-10-CM | POA: Diagnosis not present

## 2022-06-12 NOTE — Assessment & Plan Note (Signed)
Cough has worsened again just over the past few days. Refilled Hydromet for qhs use and Promethazine DM for during the day. Started on Doxycycline

## 2022-06-12 NOTE — Assessment & Plan Note (Signed)
Was seen in ER and work up was negative for cardiac cause. Is described as burning. Likely GI related. Referred to gastroenterology

## 2022-06-12 NOTE — Assessment & Plan Note (Signed)
hgba1c acceptable, minimize simple carbs. Increase exercise as tolerated. Continue current meds 

## 2022-06-12 NOTE — Assessment & Plan Note (Signed)
More trouble with constipation encouraged to start Miralax and Benefiber daily

## 2022-06-12 NOTE — Assessment & Plan Note (Signed)
Worsening, increase Pantoprazole to BID and continue qhs use of Famotidine.

## 2022-06-12 NOTE — Assessment & Plan Note (Signed)
Supplement and monitor 

## 2022-06-15 ENCOUNTER — Other Ambulatory Visit: Payer: Self-pay | Admitting: Family Medicine

## 2022-06-15 LAB — DRUG MONITORING PANEL 376104, URINE
Amphetamines: NEGATIVE ng/mL (ref <500)
Barbiturates: NEGATIVE ng/mL (ref <300)
Benzodiazepines: NEGATIVE ng/mL (ref <100)
Cocaine Metabolite: NEGATIVE ng/mL (ref <150)
Codeine: NEGATIVE ng/mL (ref <50)
Desmethyltramadol: NEGATIVE ng/mL (ref <100)
Hydrocodone: NEGATIVE ng/mL (ref <50)
Hydromorphone: NEGATIVE ng/mL (ref <50)
Morphine: NEGATIVE ng/mL (ref <50)
Norhydrocodone: NEGATIVE ng/mL (ref <50)
Opiates: NEGATIVE ng/mL (ref <100)
Oxycodone: NEGATIVE ng/mL (ref <100)
Tramadol: NEGATIVE ng/mL (ref <100)

## 2022-06-15 LAB — DM TEMPLATE

## 2022-06-18 DIAGNOSIS — Z23 Encounter for immunization: Secondary | ICD-10-CM | POA: Diagnosis not present

## 2022-06-18 DIAGNOSIS — D225 Melanocytic nevi of trunk: Secondary | ICD-10-CM | POA: Diagnosis not present

## 2022-06-18 DIAGNOSIS — L400 Psoriasis vulgaris: Secondary | ICD-10-CM | POA: Diagnosis not present

## 2022-06-18 DIAGNOSIS — L821 Other seborrheic keratosis: Secondary | ICD-10-CM | POA: Diagnosis not present

## 2022-06-18 DIAGNOSIS — L853 Xerosis cutis: Secondary | ICD-10-CM | POA: Diagnosis not present

## 2022-06-19 DIAGNOSIS — L400 Psoriasis vulgaris: Secondary | ICD-10-CM | POA: Diagnosis not present

## 2022-06-27 DIAGNOSIS — L400 Psoriasis vulgaris: Secondary | ICD-10-CM | POA: Diagnosis not present

## 2022-07-02 NOTE — Progress Notes (Signed)
Subjective:   By signing my name below, I, Kellie Simmering, attest that this documentation has been prepared under the direction and in the presence of [name], MD 07/04/2022.     Patient ID: Heidi Franklin, female    DOB: 29-Apr-1946, 76 y.o.   MRN: [id number]  No chief complaint on file.  HPI Patient is in today for an office visit.  Cough: She complains of a persistent dry cough that has been bothering her. She reports that this cough worsens at night when she lays down. She has seen ENT, pulmonology and gastroenterology about this. She has been using cough drops and Tylenol nightly, but has found little relief.  GERD: She also complains of GERD. She is currently taking Famotidine 40 mg nightly, and Pantoprazole 40 mg twice a day to manage this.   Blood pressure: Her blood pressure is within normal range today. She is currently taking Hydrochlorothiazide but is inquiring whether she can stop taking it due to her normal blood pressure readings.  BP Readings from Last 3 Encounters:  07/04/22 120/64  06/11/22 124/62  06/07/22 110/68   Supplements: She takes vitamin D supplements daily.   Immunizations: She has been informed about receiving COVID-19, high-dose Flu, and RSV immunizations. She is up to date on Pneumonia, Shingles, and Tetanus immunizations.   Past Medical History:  Diagnosis Date   ALLERGIC RHINITIS    Allergy    Anxiety state, unspecified    Arthritis    Asthma    BRONCHITIS, CHRONIC    Cataract    b/l   Chronic low back pain 10/09/2016   DEPRESSION    DIABETES MELLITUS, TYPE II    GERD    HYPERLIPIDEMIA    HYPERTENSION    Pneumonia    Psoriasis    Past Surgical History:  Procedure Laterality Date   ABDOMINAL HYSTERECTOMY  1980   CARPAL TUNNEL RELEASE Right    COLONOSCOPY     TRIGGER FINGER RELEASE Left    Index finger   UPPER GASTROINTESTINAL ENDOSCOPY     Family History  Problem Relation Age of Onset   Stroke Mother    Angina Mother     Hyperlipidemia Mother    Hypertension Mother    Heart disease Mother    Depression Mother    Diabetes Father    Hyperlipidemia Other        Parent   Hypertension Other        Parent   Diabetes Sister        x1   CAD Sister    COPD Sister        smoker   Hyperlipidemia Sister    Hypertension Sister    Diabetes Brother        x 2   Hyperlipidemia Brother    Hypertension Brother    Diabetes Brother    Hyperlipidemia Brother    Hypertension Brother    CAD Sister    Arthritis Sister    Hyperlipidemia Sister    Hypertension Sister    Colon cancer Neg Hx    Esophageal cancer Neg Hx    Rectal cancer Neg Hx    Stomach cancer Neg Hx    Pancreatic cancer Neg Hx    Social History   Socioeconomic History   Marital status: Married    Spouse name: Not on file   Number of children: 0   Years of education: 80yr[employer]   Highest education level: Not on file  Occupational  History   Occupation: Retired   Tobacco Use   Smoking status: Never    Passive exposure: Never   Smokeless tobacco: Never  Vaping Use   Vaping Use: Never used  Substance and Sexual Activity   Alcohol use: No    Alcohol/week: 0.0 standard drinks of alcohol   Drug use: No   Sexual activity: Not Currently  Other Topics Concern   Not on file  Social History Narrative   Married, lives with spouse-retired from [employer] insurance   Not employed    Drinks coffee occasional, Consumes 1 soda a day    No dietary restrictions   Social Determinants of Health   Financial Resource Strain: Low Risk  (04/25/2021)   Overall Financial Resource Strain (CARDIA)    Difficulty of Paying Living Expenses: Not very hard  Food Insecurity: No Food Insecurity (04/25/2021)   Hunger Vital Sign    Worried About Running Out of Food in the Last Year: Never true    [place] in the Last Year: Never true  Transportation Needs: No Transportation Needs (04/25/2021)   PRAPARE - Hydrologist (Medical): No     Lack of Transportation (Non-Medical): No  Physical Activity: Inactive (04/25/2021)   Exercise Vital Sign    Days of Exercise per Week: 0 days    Minutes of Exercise per Session: 0 min  Stress: Stress Concern Present (04/25/2021)   [place]    Feeling of Stress : To some extent  Social Connections: Socially Integrated (04/25/2021)   Social Connection and Isolation Panel [NHANES]    Frequency of Communication with Friends and Family: More than three times a week    Frequency of Social Gatherings with Friends and Family: More than three times a week    Attends Religious Services: More than 4 times per year    Active Member of [employer] or Organizations: Yes    Attends Archivist Meetings: 1 to 4 times per year    Marital Status: Married  Human resources officer Violence: Not At Risk (04/25/2021)   Humiliation, Afraid, Rape, and Kick questionnaire    Fear of Current or Ex-Partner: No    Emotionally Abused: No    Physically Abused: No    Sexually Abused: No   Outpatient Medications Prior to Visit  Medication Sig Dispense Refill   acetaminophen (TYLENOL) 325 MG tablet Take 650 mg by mouth every 6 (six) hours as needed for mild pain or fever.     albuterol (VENTOLIN HFA) 108 (90 Base) MCG/ACT inhaler INHALE 1-2 PUFFS INTO THE LUNGS EVERY 6 (SIX) HOURS AS NEEDED FOR WHEEZING OR SHORTNESS OF BREATH. 8.5 each 3   ALPRAZolam (XANAX) 0.5 MG tablet TAKE 1 TABLET BY MOUTH TWICE A DAY AS NEEDED FOR ANXIETY 60 tablet 1   amLODipine (NORVASC) 10 MG tablet TAKE 1 TABLET BY MOUTH EVERY DAY 90 tablet 1   aspirin 81 MG chewable tablet Chew 81 mg by mouth daily.     betamethasone dipropionate (DIPROLENE) 0.05 % ointment Apply to affected areas twice a day and then as needed flare/itch     Blood Pressure Monitoring (ADULT BLOOD PRESSURE CUFF LG) KIT Check blood pressure as needed. 1 kit 0   Cholecalciferol (VITAMIN D3) 50 MCG (2000 UT) capsule  Take 2,000 Units by mouth daily.     doxycycline (VIBRA-TABS) 100 MG tablet Take 1 tablet (100 mg total) by mouth 2 (two) times daily. [place]  tablet 0   famotidine (PEPCID) 40 MG tablet TAKE 1 TABLET BY MOUTH EVERY DAY 90 tablet 1   fluticasone (CUTIVATE) 0.05 % cream APPLY TO AFFECTED AREA TWICE A DAY 1 WEEK ON 1 WEEK OFF PRN FLARE     fluticasone (FLONASE) 50 MCG/ACT nasal spray SPRAY 2 SPRAYS INTO EACH NOSTRIL EVERY DAY 48 mL 2   glimepiride (AMARYL) 1 MG tablet TAKE 1 TABLET BY MOUTH EVERY DAY WITH BREAKFAST 90 tablet 1   glucose blood (ONE TOUCH ULTRA TEST) test strip 1 each by Other route daily. Use 1 strips to check blood sugar twice a day Ex E11.9 100 each 3   HUMIRA PEN-PS/UV/ADOL HS START 40 MG/0.8ML PNKT Every 2 weeks     HYDROcodone bit-homatropine (HYCODAN) 5-1.5 MG/5ML syrup Take 5 mLs by mouth every 8 (eight) hours as needed for cough. 120 mL 0   HYDROcodone bit-homatropine (HYCODAN) 5-1.5 MG/5ML syrup Take 5 mLs by mouth at bedtime as needed for cough. 120 mL 0   isosorbide mononitrate (IMDUR) 30 MG 24 hr tablet Take 1 tablet (30 mg total) by mouth daily. 90 tablet 3   ketoconazole (NIZORAL) 2 % cream      Lancets (ONETOUCH ULTRASOFT) lancets PRN 100 each 3   levocetirizine (XYZAL) 5 MG tablet TAKE 1 TABLET BY MOUTH EVERY DAY IN THE EVENING 90 tablet 1   metoprolol succinate (TOPROL-XL) 50 MG 24 hr tablet Take 1 tablet (50 mg total) by mouth daily. Take with or immediately following a meal. 90 tablet 3   mometasone (ELOCON) 0.1 % ointment      montelukast (SINGULAIR) 10 MG tablet TAKE 1 TABLET BY MOUTH EVERYDAY AT BEDTIME 90 tablet 1   Multiple Vitamins-Minerals (CENTRUM SILVER PO) Take 1 tablet by mouth every morning.     nitroGLYCERIN (NITROSTAT) 0.4 MG SL tablet Place 1 tablet (0.4 mg total) under the tongue every 5 (five) minutes as needed. 30 tablet 3   pantoprazole (PROTONIX) 40 MG tablet Take 1 tablet (40 mg total) by mouth 2 (two) times daily. 180 tablet 1   Potassium Chloride  ER 20 MEQ TBCR TAKE 1 TABLET BY MOUTH EVERY DAY 90 tablet 1   promethazine-dextromethorphan (PROMETHAZINE-DM) 6.25-15 MG/5ML syrup Take 5 mLs by mouth 2 (two) times daily as needed for cough (during day). 240 mL 0   rosuvastatin (CRESTOR) 20 MG tablet Take 10 mg by mouth daily.     tiZANidine (ZANAFLEX) 2 MG tablet Take 0.5-2 tablets (1-4 mg total) by mouth every 8 (eight) hours as needed for muscle spasms. 40 tablet 1   valACYclovir (VALTREX) 500 MG tablet Take 500 mg by mouth 2 (two) times daily.  0   No facility-administered medications prior to visit.   Allergies  Allergen Reactions   Metformin And Related Diarrhea   Penicillins Rash   Review of Systems  Respiratory:  Positive for cough.       Objective:    Physical Exam Constitutional:      General: She is not in acute distress.    Appearance: Normal appearance. She is not ill-appearing.  HENT:     Head: Normocephalic and atraumatic.     Right Ear: External ear normal.     Left Ear: External ear normal.     Mouth/Throat:     Mouth: Mucous membranes are moist.     Pharynx: Oropharynx is clear. Posterior oropharyngeal erythema present.  Eyes:     Extraocular Movements: Extraocular movements intact.     Pupils: Pupils  are equal, round, and reactive to light.  Cardiovascular:     Rate and Rhythm: Normal rate and regular rhythm.     Pulses: Normal pulses.     Heart sounds: Normal heart sounds. No murmur heard.    No gallop.  Pulmonary:     Effort: Pulmonary effort is normal. No respiratory distress.     Breath sounds: Normal breath sounds. No wheezing or rales.  Abdominal:     General: Bowel sounds are normal.  Skin:    General: Skin is warm and dry.  Neurological:     Mental Status: She is alert and oriented to person, place, and time.  Psychiatric:        Mood and Affect: Mood normal.        Behavior: Behavior normal.        Judgment: Judgment normal.    There were no vitals taken for this visit. Wt Readings  from Last 3 Encounters:  06/11/22 153 lb 12.8 oz (69.8 kg)  06/07/22 144 lb 1.9 oz (65.4 kg)  06/04/22 154 lb 1.3 oz (69.9 kg)   Diabetic Foot Exam - Simple   No data filed    Lab Results  Component Value Date   WBC 8.3 06/11/2022   HGB 12.3 06/11/2022   HCT 37.8 06/11/2022   PLT 212.0 06/11/2022   GLUCOSE 114 (H) 06/11/2022   CHOL 159 03/26/2022   TRIG 62.0 03/26/2022   HDL 46.80 03/26/2022   LDLDIRECT 155.1 02/09/2013   LDLCALC 100 (H) 03/26/2022   ALT 16 06/11/2022   AST 18 06/11/2022   NA 138 06/11/2022   K 3.5 06/11/2022   CL 100 06/11/2022   CREATININE 1.01 06/11/2022   BUN 15 06/11/2022   CO2 28 06/11/2022   TSH 3.95 03/26/2022   HGBA1C 6.7 (H) 03/26/2022   MICROALBUR 1.3 03/26/2022   Lab Results  Component Value Date   TSH 3.95 03/26/2022   Lab Results  Component Value Date   WBC 8.3 06/11/2022   HGB 12.3 06/11/2022   HCT 37.8 06/11/2022   MCV 95.6 06/11/2022   PLT 212.0 06/11/2022   Lab Results  Component Value Date   NA 138 06/11/2022   K 3.5 06/11/2022   CO2 28 06/11/2022   GLUCOSE 114 (H) 06/11/2022   BUN 15 06/11/2022   CREATININE 1.01 06/11/2022   BILITOT 0.4 06/11/2022   ALKPHOS 45 06/11/2022   AST 18 06/11/2022   ALT 16 06/11/2022   PROT 7.1 06/11/2022   ALBUMIN 4.1 06/11/2022   CALCIUM 9.4 06/11/2022   ANIONGAP 5 05/31/2022   EGFR 60 11/30/2021   GFR 53.79 (L) 06/11/2022   Lab Results  Component Value Date   CHOL 159 03/26/2022   Lab Results  Component Value Date   HDL 46.80 03/26/2022   Lab Results  Component Value Date   LDLCALC 100 (H) 03/26/2022   Lab Results  Component Value Date   TRIG 62.0 03/26/2022   Lab Results  Component Value Date   CHOLHDL 3 03/26/2022   Lab Results  Component Value Date   HGBA1C 6.7 (H) 03/26/2022      Assessment & Plan:   Problem List Items Addressed This Visit   None  No orders of the defined types were placed in this encounter.  I, Kellie Simmering, personally preformed the  services described in this documentation.  All medical record entries made by the scribe were at my direction and in my presence.  I have reviewed the chart  and discharge instructions (if applicable) and agree that the record reflects my personal performance and is accurate and complete. 07/04/2022  I,Mohammed Iqbal,acting as a scribe for Penni Homans, MD.,have documented all relevant documentation on the behalf of Penni Homans, MD,as directed by  Penni Homans, MD while in the presence of Penni Homans, MD.  Kellie Simmering

## 2022-07-03 NOTE — Assessment & Plan Note (Signed)
Supplement and monitor 

## 2022-07-03 NOTE — Assessment & Plan Note (Signed)
hgba1c acceptable, minimize simple carbs. Increase exercise as tolerated. Continue current meds 

## 2022-07-03 NOTE — Assessment & Plan Note (Signed)
Encourage heart healthy diet such as MIND or DASH diet, increase exercise, avoid trans fats, simple carbohydrates and processed foods, consider a krill or fish or flaxseed oil cap daily.  Tolerating Rosuvastatin 

## 2022-07-03 NOTE — Assessment & Plan Note (Signed)
Well controlled, no changes to meds. Encouraged heart healthy diet such as the DASH diet and exercise as tolerated.  °

## 2022-07-04 ENCOUNTER — Ambulatory Visit (INDEPENDENT_AMBULATORY_CARE_PROVIDER_SITE_OTHER): Payer: Medicare Other | Admitting: Family Medicine

## 2022-07-04 VITALS — BP 120/64 | HR 89 | Temp 98.0°F | Resp 16 | Ht 60.0 in | Wt 155.4 lb

## 2022-07-04 DIAGNOSIS — I10 Essential (primary) hypertension: Secondary | ICD-10-CM | POA: Diagnosis not present

## 2022-07-04 DIAGNOSIS — R053 Chronic cough: Secondary | ICD-10-CM

## 2022-07-04 DIAGNOSIS — E1169 Type 2 diabetes mellitus with other specified complication: Secondary | ICD-10-CM | POA: Diagnosis not present

## 2022-07-04 DIAGNOSIS — E559 Vitamin D deficiency, unspecified: Secondary | ICD-10-CM | POA: Diagnosis not present

## 2022-07-04 DIAGNOSIS — I25118 Atherosclerotic heart disease of native coronary artery with other forms of angina pectoris: Secondary | ICD-10-CM | POA: Diagnosis not present

## 2022-07-04 DIAGNOSIS — E1165 Type 2 diabetes mellitus with hyperglycemia: Secondary | ICD-10-CM | POA: Diagnosis not present

## 2022-07-04 DIAGNOSIS — E785 Hyperlipidemia, unspecified: Secondary | ICD-10-CM

## 2022-07-04 LAB — CBC
HCT: 35.4 % — ABNORMAL LOW (ref 36.0–46.0)
Hemoglobin: 11.6 g/dL — ABNORMAL LOW (ref 12.0–15.0)
MCHC: 32.8 g/dL (ref 30.0–36.0)
MCV: 94.5 fl (ref 78.0–100.0)
Platelets: 264 10*3/uL (ref 150.0–400.0)
RBC: 3.75 Mil/uL — ABNORMAL LOW (ref 3.87–5.11)
RDW: 13 % (ref 11.5–15.5)
WBC: 7.6 10*3/uL (ref 4.0–10.5)

## 2022-07-04 LAB — COMPREHENSIVE METABOLIC PANEL
ALT: 13 U/L (ref 0–35)
AST: 17 U/L (ref 0–37)
Albumin: 3.6 g/dL (ref 3.5–5.2)
Alkaline Phosphatase: 38 U/L — ABNORMAL LOW (ref 39–117)
BUN: 15 mg/dL (ref 6–23)
CO2: 26 mEq/L (ref 19–32)
Calcium: 9.2 mg/dL (ref 8.4–10.5)
Chloride: 108 mEq/L (ref 96–112)
Creatinine, Ser: 0.96 mg/dL (ref 0.40–1.20)
GFR: 57.15 mL/min — ABNORMAL LOW (ref >60.00)
Glucose, Bld: 103 mg/dL — ABNORMAL HIGH (ref 70–99)
Potassium: 4.1 mEq/L (ref 3.5–5.1)
Sodium: 142 mEq/L (ref 135–145)
Total Bilirubin: 0.3 mg/dL (ref 0.2–1.2)
Total Protein: 6.6 g/dL (ref 6.0–8.3)

## 2022-07-04 LAB — LIPID PANEL
Cholesterol: 126 mg/dL (ref 0–200)
HDL: 36.7 mg/dL — ABNORMAL LOW (ref >39.00)
LDL Cholesterol: 61 mg/dL (ref 0–99)
NonHDL: 89.07
Total CHOL/HDL Ratio: 3
Triglycerides: 139 mg/dL (ref 0.0–149.0)
VLDL: 27.8 mg/dL (ref 0.0–40.0)

## 2022-07-04 LAB — SEDIMENTATION RATE: Sed Rate: 11 mm/hr (ref 0–30)

## 2022-07-04 LAB — VITAMIN D 25 HYDROXY (VIT D DEFICIENCY, FRACTURES): VITD: 67.81 ng/mL (ref 30.00–100.00)

## 2022-07-04 LAB — TSH: TSH: 3.51 u[IU]/mL (ref 0.35–5.50)

## 2022-07-04 MED ORDER — AZITHROMYCIN 250 MG PO TABS: ORAL_TABLET | ORAL | 0 refills | Status: AC

## 2022-07-04 NOTE — Patient Instructions (Signed)
RSV (respiratory syncitial virus) vaccine at pharmacy Covid booster when new version out late September At pharmacy  Tetanus if injured   Cough, Adult Coughing is a reflex that clears your throat and your airways (respiratory system). Coughing helps to heal and protect your lungs. It is normal to cough occasionally, but a cough that happens with other symptoms or lasts a long time may be a sign of a condition that needs treatment. An acute cough may only last 2-3 weeks, while a chronic cough may last 8 or more weeks. Coughing is commonly caused by: Infection of the respiratory systemby viruses or bacteria. Breathing in substances that irritate your lungs. Allergies. Asthma. Mucus that runs down the back of your throat (postnasal drip). Smoking. Acid backing up from the stomach into the esophagus (gastroesophageal reflux). Certain medicines. Chronic lung problems. Other medical conditions such as heart failure or a blood clot in the lung (pulmonary embolism). Follow these instructions at home: Medicines Take over-the-counter and prescription medicines only as told by your health care provider. Talk with your health care provider before you take a cough suppressant medicine. Lifestyle  Avoid cigarette smoke. Do not use any products that contain nicotine or tobacco, such as cigarettes, e-cigarettes, and chewing tobacco. If you need help quitting, ask your health care provider. Drink enough fluid to keep your urine pale yellow. Avoid caffeine. Do not drink alcohol if your health care provider tells you not to drink. General instructions  Pay close attention to changes in your cough. Tell your health care provider about them. Always cover your mouth when you cough. Avoid things that make you cough, such as perfume, candles, cleaning products, or campfire or tobacco smoke. If the air is dry, use a cool mist vaporizer or humidifier in your bedroom or your home to help loosen  secretions. If your cough is worse at night, try to sleep in a semi-upright position. Rest as needed. Keep all follow-up visits as told by your health care provider. This is important. Contact a health care provider if you: Have new symptoms. Cough up pus. Have a cough that does not get better after 2-3 weeks or gets worse. Cannot control your cough with cough suppressant medicines and you are losing sleep. Have pain that gets worse or pain that is not helped with medicine. Have a fever. Have unexplained weight loss. Have night sweats. Get help right away if: You cough up blood. You have difficulty breathing. Your heartbeat is very fast. These symptoms may represent a serious problem that is an emergency. Do not wait to see if the symptoms will go away. Get medical help right away. Call your local emergency services (911 in the [place] Do not drive yourself to the hospital. Summary Coughing is a reflex that clears your throat and your airways. It is normal to cough occasionally, but a cough that happens with other symptoms or lasts a long time may be a sign of a condition that needs treatment. Take over-the-counter and prescription medicines only as told by your health care provider. Always cover your mouth when you cough. Contact a health care provider if you have new symptoms or a cough that does not get better after 2-3 weeks or gets worse. This information is not intended to replace advice given to you by your health care provider. Make sure you discuss any questions you have with your health care provider. Document Revised: 11/02/2018 Document Reviewed: 11/02/2018 Elsevier Patient Education  [place].

## 2022-07-05 ENCOUNTER — Telehealth: Payer: Self-pay

## 2022-07-05 ENCOUNTER — Other Ambulatory Visit (INDEPENDENT_AMBULATORY_CARE_PROVIDER_SITE_OTHER): Payer: Medicare Other

## 2022-07-05 ENCOUNTER — Telehealth: Payer: Self-pay | Admitting: Family Medicine

## 2022-07-05 ENCOUNTER — Other Ambulatory Visit: Payer: Self-pay

## 2022-07-05 DIAGNOSIS — D649 Anemia, unspecified: Secondary | ICD-10-CM

## 2022-07-05 DIAGNOSIS — I10 Essential (primary) hypertension: Secondary | ICD-10-CM

## 2022-07-05 LAB — VITAMIN B12: Vitamin B-12: 340 pg/mL (ref 211–911)

## 2022-07-05 NOTE — Telephone Encounter (Signed)
Error

## 2022-07-05 NOTE — Telephone Encounter (Signed)
Talk with pt  

## 2022-07-05 NOTE — Telephone Encounter (Signed)
Called pt was advised that it wasn't  Medication for B-12 but its labs add on test  Confirmed with pt she understand.

## 2022-07-05 NOTE — Telephone Encounter (Signed)
Done

## 2022-07-05 NOTE — Telephone Encounter (Signed)
Patient wanted to speak with [facility] about her labs. Advised her that it was to schedule a lab appointment in 1 month but patient had additional questions. Please give her a call back.

## 2022-07-07 NOTE — Assessment & Plan Note (Signed)
Has flared again. Is given refills on cough syrups and a prescription for Azithromycin to use if worsens.

## 2022-07-08 ENCOUNTER — Other Ambulatory Visit: Payer: Self-pay

## 2022-07-09 ENCOUNTER — Other Ambulatory Visit: Payer: Self-pay | Admitting: *Deleted

## 2022-07-11 DIAGNOSIS — L400 Psoriasis vulgaris: Secondary | ICD-10-CM | POA: Diagnosis not present

## 2022-07-17 ENCOUNTER — Other Ambulatory Visit: Payer: Self-pay | Admitting: Family Medicine

## 2022-07-17 ENCOUNTER — Encounter: Payer: Self-pay | Admitting: Family Medicine

## 2022-07-17 MED ORDER — METHYLPREDNISOLONE 4 MG PO TABS: ORAL_TABLET | ORAL | 0 refills | Status: DC

## 2022-07-24 ENCOUNTER — Ambulatory Visit (INDEPENDENT_AMBULATORY_CARE_PROVIDER_SITE_OTHER): Payer: Medicare Other | Admitting: Podiatry

## 2022-07-24 ENCOUNTER — Encounter: Payer: Self-pay | Admitting: Podiatry

## 2022-07-24 DIAGNOSIS — B351 Tinea unguium: Secondary | ICD-10-CM

## 2022-07-24 DIAGNOSIS — M79674 Pain in right toe(s): Secondary | ICD-10-CM | POA: Diagnosis not present

## 2022-07-24 DIAGNOSIS — E119 Type 2 diabetes mellitus without complications: Secondary | ICD-10-CM

## 2022-07-24 DIAGNOSIS — M79675 Pain in left toe(s): Secondary | ICD-10-CM

## 2022-07-24 NOTE — Progress Notes (Signed)
  Subjective:  Patient ID: Heidi Franklin, female    DOB: 05-Jan-1946,  MRN: [id number]  Heidi Franklin presents to clinic today for : Chief Complaint  Patient presents with   Nail Problem    Diabetic foot care BS-Did not check today A1C-6.8 PCP-BLYTH PCP VST- 2 weeks ago   New problem(s): None.   PCP is [name], MD , and last visit was  July 04, 2022.  Allergies  Allergen Reactions   Metformin And Related Diarrhea   Penicillins Rash   Patient states her husband has been ill.  Review of Systems: Negative except as noted in the HPI.  Objective: No changes noted in today's physical examination.  Heidi Franklin is a pleasant 76 y.o. female in NAD. AAO x 3.  Vascular Examination: CFT <3 seconds b/l LE. Palpable DP pulse(s) b/l LE. Faintly palpable PT pulse(s) b/l LE. Pedal hair absent. No pain with calf compression b/l. Lower extremity skin temperature gradient within normal limits. No edema noted b/l LE. No cyanosis or clubbing noted b/l LE.  Dermatological Examination: Pedal skin is warm and supple b/l LE. No open wounds b/l LE. No interdigital macerations noted b/l LE. Toenails 1-5 b/l elongated, discolored, dystrophic, thickened, crumbly with subungual debris and tenderness to dorsal palpation.  Neurological Examination: Protective sensation intact 5/5 intact bilaterally with 10g monofilament b/l. Vibratory sensation intact b/l. Proprioception intact bilaterally.  Musculoskeletal Examination: Normal muscle strength 5/5 to all lower extremity muscle groups bilaterally. HAV with bunion deformity noted b/l LE. Pes planus deformity noted bilateral LE.Marland Kitchen No pain, crepitus or joint limitation noted with ROM b/l LE.  Patient ambulates independently without assistive aids.  Assessment/Plan: 1. Pain due to onychomycosis of toenails of both feet   2. Diabetes mellitus without complication ([place])     -Consent given for treatment as described below: -Examined  patient. -Stressed the importance of good glycemic control and the detriment of not  controlling glucose levels in relation to the foot. -Mycotic toenails 1-5 bilaterally were debrided in length and girth with sterile nail nippers and dremel without incident. -Patient/POA to call should there be question/concern in the interim.   Return in about 3 months (around 10/23/2022).  Marzetta Board, DPM

## 2022-07-29 DIAGNOSIS — L400 Psoriasis vulgaris: Secondary | ICD-10-CM | POA: Diagnosis not present

## 2022-08-04 ENCOUNTER — Other Ambulatory Visit: Payer: Self-pay | Admitting: Family Medicine

## 2022-08-05 ENCOUNTER — Other Ambulatory Visit: Payer: Medicare Other

## 2022-08-07 ENCOUNTER — Other Ambulatory Visit (INDEPENDENT_AMBULATORY_CARE_PROVIDER_SITE_OTHER): Payer: Medicare Other

## 2022-08-07 DIAGNOSIS — I10 Essential (primary) hypertension: Secondary | ICD-10-CM | POA: Diagnosis not present

## 2022-08-07 LAB — CBC WITH DIFFERENTIAL/PLATELET
Basophils Absolute: 0 10*3/uL (ref 0.0–0.1)
Basophils Relative: 0.4 % (ref 0.0–3.0)
Eosinophils Absolute: 0.1 10*3/uL (ref 0.0–0.7)
Eosinophils Relative: 2.1 % (ref 0.0–5.0)
HCT: 35 % — ABNORMAL LOW (ref 36.0–46.0)
Hemoglobin: 11.4 g/dL — ABNORMAL LOW (ref 12.0–15.0)
Lymphocytes Relative: 44.7 % (ref 12.0–46.0)
Lymphs Abs: 2.9 10*3/uL (ref 0.7–4.0)
MCHC: 32.6 g/dL (ref 30.0–36.0)
MCV: 94.7 fl (ref 78.0–100.0)
Monocytes Absolute: 0.5 10*3/uL (ref 0.1–1.0)
Monocytes Relative: 7.1 % (ref 3.0–12.0)
Neutro Abs: 3 10*3/uL (ref 1.4–7.7)
Neutrophils Relative %: 45.7 % (ref 43.0–77.0)
Platelets: 210 10*3/uL (ref 150.0–400.0)
RBC: 3.7 Mil/uL — ABNORMAL LOW (ref 3.87–5.11)
RDW: 13.3 % (ref 11.5–15.5)
WBC: 6.5 10*3/uL (ref 4.0–10.5)

## 2022-08-08 ENCOUNTER — Other Ambulatory Visit: Payer: Self-pay

## 2022-08-08 DIAGNOSIS — D649 Anemia, unspecified: Secondary | ICD-10-CM

## 2022-08-08 DIAGNOSIS — I10 Essential (primary) hypertension: Secondary | ICD-10-CM

## 2022-08-12 ENCOUNTER — Other Ambulatory Visit (INDEPENDENT_AMBULATORY_CARE_PROVIDER_SITE_OTHER): Payer: Medicare Other

## 2022-08-12 DIAGNOSIS — L400 Psoriasis vulgaris: Secondary | ICD-10-CM | POA: Diagnosis not present

## 2022-08-12 DIAGNOSIS — D649 Anemia, unspecified: Secondary | ICD-10-CM | POA: Diagnosis not present

## 2022-08-13 ENCOUNTER — Telehealth: Payer: Self-pay

## 2022-08-13 ENCOUNTER — Telehealth: Payer: Self-pay | Admitting: *Deleted

## 2022-08-13 DIAGNOSIS — D649 Anemia, unspecified: Secondary | ICD-10-CM

## 2022-08-13 LAB — FECAL OCCULT BLOOD, IMMUNOCHEMICAL: Fecal Occult Bld: POSITIVE — AB

## 2022-08-13 NOTE — Telephone Encounter (Signed)
Received call from Heidi Franklin at [place] lab reporting positive IFOB.

## 2022-08-13 NOTE — Telephone Encounter (Signed)
Done

## 2022-08-13 NOTE — Addendum Note (Signed)
Addended by: Laure Kidney on: 08/13/2022 01:15 PM   Modules accepted: Orders

## 2022-08-14 ENCOUNTER — Other Ambulatory Visit: Payer: Medicare Other

## 2022-08-15 ENCOUNTER — Other Ambulatory Visit (INDEPENDENT_AMBULATORY_CARE_PROVIDER_SITE_OTHER): Payer: Medicare Other

## 2022-08-15 ENCOUNTER — Telehealth: Payer: Self-pay | Admitting: *Deleted

## 2022-08-15 DIAGNOSIS — I10 Essential (primary) hypertension: Secondary | ICD-10-CM | POA: Diagnosis not present

## 2022-08-15 DIAGNOSIS — D649 Anemia, unspecified: Secondary | ICD-10-CM

## 2022-08-15 LAB — CBC WITH DIFFERENTIAL/PLATELET
Basophils Absolute: 0.1 10*3/uL (ref 0.0–0.1)
Basophils Relative: 1 % (ref 0.0–3.0)
Eosinophils Absolute: 0.1 10*3/uL (ref 0.0–0.7)
Eosinophils Relative: 1.5 % (ref 0.0–5.0)
HCT: 34.4 % — ABNORMAL LOW (ref 36.0–46.0)
Hemoglobin: 11.4 g/dL — ABNORMAL LOW (ref 12.0–15.0)
Lymphocytes Relative: 44.5 % (ref 12.0–46.0)
Lymphs Abs: 3.1 10*3/uL (ref 0.7–4.0)
MCHC: 33.1 g/dL (ref 30.0–36.0)
MCV: 93.4 fl (ref 78.0–100.0)
Monocytes Absolute: 0.6 10*3/uL (ref 0.1–1.0)
Monocytes Relative: 8.6 % (ref 3.0–12.0)
Neutro Abs: 3.1 10*3/uL (ref 1.4–7.7)
Neutrophils Relative %: 44.4 % (ref 43.0–77.0)
Platelets: 282 10*3/uL (ref 150.0–400.0)
RBC: 3.68 Mil/uL — ABNORMAL LOW (ref 3.87–5.11)
RDW: 13.7 % (ref 11.5–15.5)
WBC: 7 10*3/uL (ref 4.0–10.5)

## 2022-08-15 NOTE — Telephone Encounter (Signed)
Patient called about labs and was notified of results.

## 2022-08-16 LAB — IRON,TIBC AND FERRITIN PANEL
%SAT: 20 % (calc) (ref 16–45)
Ferritin: 43 ng/mL (ref 16–288)
Iron: 73 ug/dL (ref 45–160)
TIBC: 361 mcg/dL (calc) (ref 250–450)

## 2022-08-20 ENCOUNTER — Telehealth: Payer: Self-pay | Admitting: Physician Assistant

## 2022-08-20 NOTE — Telephone Encounter (Signed)
Patient scheduled for 09/25/22 at 9:00 am with Dr. Tarri Glenn. This is the soonest available appointment. Advised she call back to check in from time to time to see if there is a sooner opening. Pt verbalized all understanding.

## 2022-08-20 NOTE — Telephone Encounter (Signed)
Inbound call from patient stating that she has a referral  in from her PCP for Anemia. Patient was offered 11/30 with one of the PA's and is requesting a call back to discuss if this is something she needs a sooner appointment for or not. Please advise.

## 2022-08-26 DIAGNOSIS — L400 Psoriasis vulgaris: Secondary | ICD-10-CM | POA: Diagnosis not present

## 2022-09-04 ENCOUNTER — Other Ambulatory Visit: Payer: Self-pay | Admitting: Family Medicine

## 2022-09-05 ENCOUNTER — Ambulatory Visit (INDEPENDENT_AMBULATORY_CARE_PROVIDER_SITE_OTHER): Payer: Medicare Other

## 2022-09-05 ENCOUNTER — Ambulatory Visit
Admission: EM | Admit: 2022-09-05 | Discharge: 2022-09-05 | Disposition: A | Discharge status: Home / Self Care | Payer: Medicare Other | Attending: Family Medicine | Admitting: Family Medicine

## 2022-09-05 DIAGNOSIS — M79671 Pain in right foot: Secondary | ICD-10-CM

## 2022-09-05 DIAGNOSIS — R600 Localized edema: Secondary | ICD-10-CM | POA: Diagnosis not present

## 2022-09-05 MED ORDER — HYDROCODONE-ACETAMINOPHEN 5-325 MG PO TABS: 1.0000 | ORAL_TABLET | Freq: Three times a day (TID) | ORAL | 0 refills | Status: DC | PRN

## 2022-09-05 MED ORDER — CELECOXIB 100 MG PO CAPS: 100.0000 mg | ORAL_CAPSULE | Freq: Two times a day (BID) | ORAL | 0 refills | Status: AC

## 2022-09-05 NOTE — Discharge Instructions (Addendum)
Advised/informed patient of right foot x-ray results with hard copy provided to patient this evening.  Advised patient to take medication as directed with food to completion.  Advised patient may use Norco for breakthrough right foot/right heel pain.  Encouraged patient increase daily water intake to 64 ounces per day while taking this medication.  Advised patient to make appointment for [facility] health podiatry provider for further evaluation early next week Monday, 09/09/2022.  Contact information is below.

## 2022-09-05 NOTE — ED Triage Notes (Signed)
Pt c/o RT foot pain x 1 week. Pain mostly in heel area. Ibuprofen and pain cream prn. Pain 7/10

## 2022-09-05 NOTE — ED Provider Notes (Signed)
[facility]    CSN: [id number] Arrival date & time: 09/05/22  1501      History   Chief Complaint Chief Complaint  Patient presents with   Foot Pain    RT    HPI Heidi Franklin is a 76 y.o. female.   HPI Very pleasant 76 year old female presents with right foot pain for 1 week reports pain mostly in heel area Ibuprofen pain cream as needed.  PMH significant for morbid obesity, multiple pulmonary nodules, HTN, and HLD associated with T2DM.  Past Medical History:  Diagnosis Date   ALLERGIC RHINITIS    Allergy    Anxiety state, unspecified    Arthritis    Asthma    BRONCHITIS, CHRONIC    Cataract    b/l   Chronic low back pain 10/09/2016   DEPRESSION    DIABETES MELLITUS, TYPE II    GERD    HYPERLIPIDEMIA    HYPERTENSION    Pneumonia    Psoriasis     Patient Active Problem List   Diagnosis Date Noted   ALLERGIC RHINITIS 11/15/2021   Arthritis 11/15/2021   Asthma 11/15/2021   BRONCHITIS, CHRONIC 11/15/2021   Change in bowel habits 10/18/2021   Type 2 diabetes mellitus with hyperglycemia ([place]) 09/24/2021   Acute bilateral knee pain 09/24/2021   Vitiligo 09/07/2021   Pedal edema 03/27/2020   Body mass index (BMI) 31.0-31.9, adult 11/08/2019   Disc displacement, lumbar 11/08/2019   Strain of lumbar paraspinal muscle 11/08/2019   History of chicken pox 04/11/2019   Vitamin D deficiency 04/05/2019   Lumbar radiculopathy 03/31/2019   Neck pain 07/06/2018   Trapezius muscle strain, right, initial encounter 06/23/2018   Edema 06/23/2018   Gout 04/08/2018   Herpes simplex 02/10/2018   ETD (eustachian tube dysfunction) 02/10/2018   Peroneal tendinitis, right 01/06/2018   Psoriasis 11/14/2017   Acute bursitis of right shoulder 09/29/2017   Bilateral hip pain 07/10/2017   Bronchiectasis without complication ([place]) 23/55/7322   Irritable larynx syndrome 04/25/2017   Reflux laryngitis 04/23/2017   Benign lipomatous neoplasm of skin and subcutaneous  tissue of right leg 02/25/2017   Anemia 02/05/2017   Fatigue 01/23/2017   Cough 01/02/2017   Multiple pulmonary nodules 11/10/2016   Chronic low back pain 10/09/2016   Degenerative arthritis of right knee 06/20/2016   Degenerative arthritis of left knee 04/10/2016   Bronchitis 12/15/2015   Peroneal tendinitis of left lower extremity 09/05/2015   Recurrent falls 08/08/2015   Bursitis of left shoulder 07/13/2015   Morbid obesity due to excess calories ([place]) 04/16/2015   Atypical chest pain 05/11/2011   Nonspecific abnormal electrocardiogram (ECG) (EKG) 05/11/2011   Hyperlipidemia associated with type 2 diabetes mellitus ([place]) 03/02/2010   Depression with anxiety 03/02/2010   Essential hypertension 03/02/2010   Chronic rhinitis 03/02/2010   Upper airway cough syndrome 03/02/2010   GERD 03/02/2010    Past Surgical History:  Procedure Laterality Date   ABDOMINAL HYSTERECTOMY  1980   CARPAL TUNNEL RELEASE Right    COLONOSCOPY     TRIGGER FINGER RELEASE Left    Index finger   UPPER GASTROINTESTINAL ENDOSCOPY      OB History   No obstetric history on file.      Home Medications    Prior to Admission medications   Medication Sig Start Date End Date Taking? Authorizing Provider  celecoxib (CELEBREX) 100 MG capsule Take 1 capsule (100 mg total) by mouth 2 (two) times daily for 15 days. 09/05/22  09/20/22 Yes Eliezer Lofts, FNP  HYDROcodone-acetaminophen (NORCO/VICODIN) 5-325 MG tablet Take 1 tablet by mouth every 8 (eight) hours as needed. 09/05/22  Yes Eliezer Lofts, FNP  acetaminophen (TYLENOL) 325 MG tablet Take 650 mg by mouth every 6 (six) hours as needed for mild pain or fever.    [provider]  albuterol (VENTOLIN HFA) 108 (90 Base) MCG/ACT inhaler INHALE 1-2 PUFFS INTO THE LUNGS EVERY 6 (SIX) HOURS AS NEEDED FOR WHEEZING OR SHORTNESS OF BREATH. 06/21/21   [name], MD  ALPRAZolam Duanne Moron) 0.5 MG tablet TAKE 1 TABLET BY MOUTH TWICE A DAY AS NEEDED FOR ANXIETY  06/05/22   [name], MD  amLODipine (NORVASC) 10 MG tablet TAKE 1 TABLET BY MOUTH EVERY DAY 09/04/22   [name], MD  aspirin 81 MG chewable tablet Chew 81 mg by mouth daily.    [provider]  betamethasone dipropionate (DIPROLENE) 0.05 % ointment Apply to affected areas twice a day and then as needed flare/itch 01/13/20   [provider]  Blood Pressure Monitoring (ADULT BLOOD PRESSURE CUFF LG) KIT Check blood pressure as needed. 05/01/22   [name], MD  Cholecalciferol (VITAMIN D3) 50 MCG (2000 UT) capsule Take 2,000 Units by mouth daily.    [provider]  famotidine (PEPCID) 40 MG tablet TAKE 1 TABLET BY MOUTH EVERY DAY 08/05/22   [name], MD  fluticasone (CUTIVATE) 0.05 % cream APPLY TO AFFECTED AREA TWICE A DAY 1 WEEK ON 1 WEEK OFF PRN FLARE 01/29/20   [provider]  fluticasone (FLONASE) 50 MCG/ACT nasal spray SPRAY 2 SPRAYS INTO EACH NOSTRIL EVERY DAY 01/07/22   [name], MD  glimepiride (AMARYL) 1 MG tablet TAKE 1 TABLET BY MOUTH EVERY DAY WITH BREAKFAST 04/10/22   [name], MD  glucose blood (ONE TOUCH ULTRA TEST) test strip 1 each by Other route daily. Use 1 strips to check blood sugar twice a day Ex E11.9 05/26/19   [name], MD  HUMIRA PEN-PS/UV/ADOL HS START 40 MG/0.8ML PNKT Every 2 weeks 01/27/20   [provider]  HYDROcodone bit-homatropine (HYCODAN) 5-1.5 MG/5ML syrup Take 5 mLs by mouth at bedtime as needed for cough. 06/11/22   [name], MD  isosorbide mononitrate (IMDUR) 30 MG 24 hr tablet Take 1 tablet (30 mg total) by mouth daily. 06/04/22   Richardo Priest, MD  ketoconazole (NIZORAL) 2 % cream     [provider]  Lancets (ONETOUCH ULTRASOFT) lancets PRN 05/26/19   [name], MD  levocetirizine (XYZAL) 5 MG tablet TAKE 1 TABLET BY MOUTH EVERY DAY IN THE EVENING 06/17/22   [name], MD  metoprolol succinate (TOPROL-XL) 50 MG 24 hr tablet Take 1 tablet (50 mg total) by  mouth daily. Take with or immediately following a meal. 06/04/22   Munley, Hilton Cork, MD  mometasone (ELOCON) 0.1 % ointment  05/16/16   [provider]  montelukast (SINGULAIR) 10 MG tablet TAKE 1 TABLET BY MOUTH EVERYDAY AT BEDTIME 08/05/22   [name], MD  Multiple Vitamins-Minerals (CENTRUM SILVER PO) Take 1 tablet by mouth every morning.    [provider]  nitroGLYCERIN (NITROSTAT) 0.4 MG SL tablet Place 1 tablet (0.4 mg total) under the tongue every 5 (five) minutes as needed. 11/20/21 02/26/22  Richardo Priest, MD  pantoprazole (PROTONIX) 40 MG tablet Take 1 tablet (40 mg total) by mouth 2 (two) times daily. 06/11/22   Charlett Blake,  Bonnita Levan, MD  Potassium Chloride ER 20 MEQ TBCR TAKE 1 TABLET BY MOUTH EVERY DAY 08/05/22   [name], MD  promethazine-dextromethorphan (PROMETHAZINE-DM) 6.25-15 MG/5ML syrup Take 5 mLs by mouth 2 (two) times daily as needed for cough (during day). 06/11/22   [name], MD  rosuvastatin (CRESTOR) 20 MG tablet Take 10 mg by mouth daily.    [provider]  tiZANidine (ZANAFLEX) 2 MG tablet Take 0.5-2 tablets (1-4 mg total) by mouth every 8 (eight) hours as needed for muscle spasms. 09/24/21   [name], MD  triamcinolone cream (KENALOG) 0.1 % Apply topically 2 (two) times daily as needed. 06/18/22   [provider]  valACYclovir (VALTREX) 500 MG tablet Take 500 mg by mouth 2 (two) times daily. 07/30/18   [provider]    Family History Family History  Problem Relation Age of Onset   Stroke Mother    Angina Mother    Hyperlipidemia Mother    Hypertension Mother    Heart disease Mother    Depression Mother    Diabetes Father    Hyperlipidemia Other        Parent   Hypertension Other        Parent   Diabetes Sister        x1   CAD Sister    COPD Sister        smoker   Hyperlipidemia Sister    Hypertension Sister    Diabetes Brother        x 2   Hyperlipidemia Brother    Hypertension Brother     Diabetes Brother    Hyperlipidemia Brother    Hypertension Brother    CAD Sister    Arthritis Sister    Hyperlipidemia Sister    Hypertension Sister    Colon cancer Neg Hx    Esophageal cancer Neg Hx    Rectal cancer Neg Hx    Stomach cancer Neg Hx    Pancreatic cancer Neg Hx     Social History Social History   Tobacco Use   Smoking status: Never    Passive exposure: Never   Smokeless tobacco: Never  Vaping Use   Vaping Use: Never used  Substance Use Topics   Alcohol use: No    Alcohol/week: 0.0 standard drinks of alcohol   Drug use: No     Allergies   Metformin and related and Penicillins   Review of Systems Review of Systems  All other systems reviewed and are negative.    Physical Exam Triage Vital Signs ED Triage Vitals  Enc Vitals Group     BP 09/05/22 1535 121/71     Pulse Rate 09/05/22 1535 70     Resp 09/05/22 1535 17     Temp 09/05/22 1535 98.3 F (36.8 C)     Temp Source 09/05/22 1535 Oral     SpO2 09/05/22 1535 98 %     Weight --      Height --      Head Circumference --      Peak Flow --      Pain Score 09/05/22 1536 7     Pain Loc --      Pain Edu? --      Excl. in [place]? --    No data found.  Updated Vital Signs BP 121/71 (BP Location: Right Arm)   Pulse 70   Temp 98.3 F (36.8 C) (Oral)   Resp 17  SpO2 98%      Physical Exam Vitals and nursing note reviewed.  Constitutional:      Appearance: Normal appearance. She is normal weight.  HENT:     Head: Normocephalic and atraumatic.     Mouth/Throat:     Mouth: Mucous membranes are moist.     Pharynx: Oropharynx is clear.  Eyes:     Extraocular Movements: Extraocular movements intact.     Conjunctiva/sclera: Conjunctivae normal.     Pupils: Pupils are equal, round, and reactive to light.  Cardiovascular:     Rate and Rhythm: Normal rate and regular rhythm.     Pulses: Normal pulses.     Heart sounds: Normal heart sounds. No murmur heard. Pulmonary:     Effort: Pulmonary  effort is normal.     Breath sounds: Normal breath sounds. No wheezing, rhonchi or rales.  Musculoskeletal:        General: Normal range of motion.     Cervical back: Normal range of motion and neck supple.  Skin:    General: Skin is warm and dry.  Neurological:     General: No focal deficit present.     Mental Status: She is alert and oriented to person, place, and time.      UC Treatments / Results  Labs (all labs ordered are listed, but only abnormal results are displayed) Labs Reviewed - No data to display  EKG   Radiology DG Foot Complete Right  Result Date: 09/05/2022 CLINICAL DATA:  Heel pain for 1 week, no stated injury EXAM: RIGHT FOOT COMPLETE - 3+ VIEW COMPARISON:  10/11/2020 FINDINGS: There is no evidence of fracture or dislocation. Large, plantar calcaneal spur. There is no evidence of arthropathy or other focal bone abnormality. Incidental note of accessory os peroneus. Diffuse soft tissue edema about the foot. IMPRESSION: 1. No fracture or dislocation of the right foot. 2. Large, plantar calcaneal spur which may be symptomatic. 3. Diffuse soft tissue edema about the foot. Electronically Signed   By: Delanna Ahmadi M.D.   On: 09/05/2022 15:58    Procedures Procedures (including critical care time)  Medications Ordered in UC Medications - No data to display  Initial Impression / Assessment and Plan / UC Course  I have reviewed the triage vital signs and the nursing notes.  Pertinent labs & imaging results that were available during my care of the patient were reviewed by me and considered in my medical decision making (see chart for details).     MDM: 1.  Right foot pain-right foot x-ray revealed above, Rx'd Celebrex, Norco. Advised/informed patient of right foot x-ray results with hard copy provided to patient this evening.  Advised patient to take medication as directed with food to completion.  Advised patient may use Norco for breakthrough right foot/right heel  pain.  Encouraged patient increase daily water intake to 64 ounces per day while taking this medication.  Advised patient to make appointment for [facility] podiatry provider for further evaluation early next week Monday, 09/09/2022.  Contact information is below.  Final Clinical Impressions(s) / UC Diagnoses   Final diagnoses:  Foot pain, right     Discharge Instructions      Advised/informed patient of right foot x-ray results with hard copy provided to patient this evening.  Advised patient to take medication as directed with food to completion.  Advised patient may use Norco for breakthrough right foot/right heel pain.  Encouraged patient increase daily water intake to 64 ounces per  day while taking this medication.  Advised patient to make appointment for [facility] health podiatry provider for further evaluation early next week Monday, 09/09/2022.  Contact information is below.     ED Prescriptions     Medication Sig Dispense Auth. Provider   celecoxib (CELEBREX) 100 MG capsule Take 1 capsule (100 mg total) by mouth 2 (two) times daily for 15 days. 30 capsule Eliezer Lofts, FNP   HYDROcodone-acetaminophen (NORCO/VICODIN) 5-325 MG tablet Take 1 tablet by mouth every 8 (eight) hours as needed. 15 tablet Eliezer Lofts, FNP      I have reviewed the PDMP during this encounter.   Eliezer Lofts, [place] 09/05/22 1647

## 2022-09-09 DIAGNOSIS — L400 Psoriasis vulgaris: Secondary | ICD-10-CM | POA: Diagnosis not present

## 2022-09-13 ENCOUNTER — Ambulatory Visit (INDEPENDENT_AMBULATORY_CARE_PROVIDER_SITE_OTHER): Payer: Medicare Other | Admitting: Podiatrist

## 2022-09-13 ENCOUNTER — Ambulatory Visit (INDEPENDENT_AMBULATORY_CARE_PROVIDER_SITE_OTHER): Payer: Medicare Other

## 2022-09-13 DIAGNOSIS — M19071 Primary osteoarthritis, right ankle and foot: Secondary | ICD-10-CM | POA: Diagnosis not present

## 2022-09-13 DIAGNOSIS — M7731 Calcaneal spur, right foot: Secondary | ICD-10-CM

## 2022-09-13 DIAGNOSIS — I25118 Atherosclerotic heart disease of native coronary artery with other forms of angina pectoris: Secondary | ICD-10-CM

## 2022-09-13 DIAGNOSIS — M67971 Unspecified disorder of synovium and tendon, right ankle and foot: Secondary | ICD-10-CM | POA: Diagnosis not present

## 2022-09-13 NOTE — Patient Instructions (Signed)
Wear the boot whenever you would be doing a lot of walking for the next 2-3 weeks. You may remove it when at home or resting/ sleeping.  Apply the patch the the spot on the back of the tendon that hurts.  You may swap it out daily or remove before going to sleep.    Call if it isn't any better after 3-4 weeks.

## 2022-09-13 NOTE — Progress Notes (Unsigned)
Chief Complaint  Patient presents with   Heel Spurs     On going for 1 year , has gotten worse     HPI: Patient is 76 y.o. female who presents today for pain posteror heel right.  She relates pain when walking and relates when it gets really bad she feels like she has to drag her foot.  She was seen at the urgent care who recommended celebrex and to see podiatry.  She denies any trauma or injury to the foot or ankle. Denies hearing or feeling a pop in the back of the heel or leg. No pain plantarly reported.   Patient Active Problem List   Diagnosis Date Noted   ALLERGIC RHINITIS 11/15/2021   Arthritis 11/15/2021   Asthma 11/15/2021   BRONCHITIS, CHRONIC 11/15/2021   Change in bowel habits 10/18/2021   Type 2 diabetes mellitus with hyperglycemia ([place]) 09/24/2021   Acute bilateral knee pain 09/24/2021   Vitiligo 09/07/2021   Pedal edema 03/27/2020   Body mass index (BMI) 31.0-31.9, adult 11/08/2019   Disc displacement, lumbar 11/08/2019   Strain of lumbar paraspinal muscle 11/08/2019   History of chicken pox 04/11/2019   Vitamin D deficiency 04/05/2019   Lumbar radiculopathy 03/31/2019   Neck pain 07/06/2018   Trapezius muscle strain, right, initial encounter 06/23/2018   Edema 06/23/2018   Gout 04/08/2018   Herpes simplex 02/10/2018   ETD (eustachian tube dysfunction) 02/10/2018   Peroneal tendinitis, right 01/06/2018   Psoriasis 11/14/2017   Acute bursitis of right shoulder 09/29/2017   Bilateral hip pain 07/10/2017   Bronchiectasis without complication ([place]) 82/57/4935   Irritable larynx syndrome 04/25/2017   Reflux laryngitis 04/23/2017   Benign lipomatous neoplasm of skin and subcutaneous tissue of right leg 02/25/2017   Anemia 02/05/2017   Fatigue 01/23/2017   Cough 01/02/2017   Multiple pulmonary nodules 11/10/2016   Chronic low back pain 10/09/2016   Degenerative arthritis of right knee 06/20/2016   Degenerative arthritis of left knee 04/10/2016   Bronchitis  12/15/2015   Peroneal tendinitis of left lower extremity 09/05/2015   Recurrent falls 08/08/2015   Bursitis of left shoulder 07/13/2015   Morbid obesity due to excess calories ([place]) 04/16/2015   Atypical chest pain 05/11/2011   Nonspecific abnormal electrocardiogram (ECG) (EKG) 05/11/2011   Hyperlipidemia associated with type 2 diabetes mellitus ([place]) 03/02/2010   Depression with anxiety 03/02/2010   Essential hypertension 03/02/2010   Chronic rhinitis 03/02/2010   Upper airway cough syndrome 03/02/2010   GERD 03/02/2010    Current Outpatient Medications on File Prior to Visit  Medication Sig Dispense Refill   acetaminophen (TYLENOL) 325 MG tablet Take 650 mg by mouth every 6 (six) hours as needed for mild pain or fever.     albuterol (VENTOLIN HFA) 108 (90 Base) MCG/ACT inhaler INHALE 1-2 PUFFS INTO THE LUNGS EVERY 6 (SIX) HOURS AS NEEDED FOR WHEEZING OR SHORTNESS OF BREATH. 8.5 each 3   ALPRAZolam (XANAX) 0.5 MG tablet TAKE 1 TABLET BY MOUTH TWICE A DAY AS NEEDED FOR ANXIETY 60 tablet 1   amLODipine (NORVASC) 10 MG tablet TAKE 1 TABLET BY MOUTH EVERY DAY 90 tablet 1   aspirin 81 MG chewable tablet Chew 81 mg by mouth daily.     betamethasone dipropionate (DIPROLENE) 0.05 % ointment Apply to affected areas twice a day and then as needed flare/itch     Blood Pressure Monitoring (ADULT BLOOD PRESSURE CUFF LG) KIT Check blood pressure as needed. 1 kit 0   celecoxib (  CELEBREX) 100 MG capsule Take 1 capsule (100 mg total) by mouth 2 (two) times daily for 15 days. 30 capsule 0   Cholecalciferol (VITAMIN D3) 50 MCG (2000 UT) capsule Take 2,000 Units by mouth daily.     famotidine (PEPCID) 40 MG tablet TAKE 1 TABLET BY MOUTH EVERY DAY 90 tablet 1   fluticasone (CUTIVATE) 0.05 % cream APPLY TO AFFECTED AREA TWICE A DAY 1 WEEK ON 1 WEEK OFF PRN FLARE     fluticasone (FLONASE) 50 MCG/ACT nasal spray SPRAY 2 SPRAYS INTO EACH NOSTRIL EVERY DAY 48 mL 2   glimepiride (AMARYL) 1 MG tablet TAKE 1 TABLET  BY MOUTH EVERY DAY WITH BREAKFAST 90 tablet 1   glucose blood (ONE TOUCH ULTRA TEST) test strip 1 each by Other route daily. Use 1 strips to check blood sugar twice a day Ex E11.9 100 each 3   HUMIRA PEN-PS/UV/ADOL HS START 40 MG/0.8ML PNKT Every 2 weeks     HYDROcodone bit-homatropine (HYCODAN) 5-1.5 MG/5ML syrup Take 5 mLs by mouth at bedtime as needed for cough. 120 mL 0   HYDROcodone-acetaminophen (NORCO/VICODIN) 5-325 MG tablet Take 1 tablet by mouth every 8 (eight) hours as needed. 15 tablet 0   isosorbide mononitrate (IMDUR) 30 MG 24 hr tablet Take 1 tablet (30 mg total) by mouth daily. 90 tablet 3   ketoconazole (NIZORAL) 2 % cream      Lancets (ONETOUCH ULTRASOFT) lancets PRN 100 each 3   levocetirizine (XYZAL) 5 MG tablet TAKE 1 TABLET BY MOUTH EVERY DAY IN THE EVENING 90 tablet 1   metoprolol succinate (TOPROL-XL) 50 MG 24 hr tablet Take 1 tablet (50 mg total) by mouth daily. Take with or immediately following a meal. 90 tablet 3   mometasone (ELOCON) 0.1 % ointment      montelukast (SINGULAIR) 10 MG tablet TAKE 1 TABLET BY MOUTH EVERYDAY AT BEDTIME 90 tablet 1   Multiple Vitamins-Minerals (CENTRUM SILVER PO) Take 1 tablet by mouth every morning.     nitroGLYCERIN (NITROSTAT) 0.4 MG SL tablet Place 1 tablet (0.4 mg total) under the tongue every 5 (five) minutes as needed. 30 tablet 3   pantoprazole (PROTONIX) 40 MG tablet Take 1 tablet (40 mg total) by mouth 2 (two) times daily. 180 tablet 1   Potassium Chloride ER 20 MEQ TBCR TAKE 1 TABLET BY MOUTH EVERY DAY 90 tablet 1   promethazine-dextromethorphan (PROMETHAZINE-DM) 6.25-15 MG/5ML syrup Take 5 mLs by mouth 2 (two) times daily as needed for cough (during day). 240 mL 0   rosuvastatin (CRESTOR) 20 MG tablet Take 10 mg by mouth daily.     tiZANidine (ZANAFLEX) 2 MG tablet Take 0.5-2 tablets (1-4 mg total) by mouth every 8 (eight) hours as needed for muscle spasms. 40 tablet 1   triamcinolone cream (KENALOG) 0.1 % Apply topically 2  (two) times daily as needed.     valACYclovir (VALTREX) 500 MG tablet Take 500 mg by mouth 2 (two) times daily.  0   No current facility-administered medications on file prior to visit.    Allergies  Allergen Reactions   Metformin And Related Diarrhea   Penicillins Rash    Review of Systems No fevers, chills, nausea, muscle aches, no difficulty breathing, no calf pain, no chest pain or shortness of breath.   Physical Exam  GENERAL APPEARANCE: Alert, conversant. Appropriately groomed. No acute distress.   VASCULAR: Pedal pulses palpable 1/4 DP and 1/4 PT bilateral.  Capillary refill time is immediate to all digits,  Proximal to distal cooling is warm to warm.  Digital perfusion adequate.   NEUROLOGIC: sensation is intact to 5.07 monofilament at 5/5 sites bilateral.  Light touch is intact bilateral, vibratory sensation intact bilateral  MUSCULOSKELETAL: acceptable muscle strength, tone and stability bilateral.  Bunion bilateral, pes planus bilateral.  Pain on palpation posterior ankle at the achilles tendon and at its insertion.  Plantar flexion of the foot with medial to lateral calf compression is noted. No pain with calf compression reported.    DERMATOLOGIC: skin is warm, supple, and dry.  Color, texture, and turgor of skin within normal limits.  No open wounds are noted.  No preulcerative lesions are seen.  Digital nails are asymptomatic.    Xrays today compared to those on November 9th.  Large inferior calcaneal spur noted.  No significant soft tissue swelling in the area of pain noted.  Minimal calcification mid substance of the achilles tendon seen on the nwb xrays on nov. 9th- not as visible on weight bearing films.  No acute fracture or dislocations noted.   Assessment     ICD-10-CM   1. Achilles tendon disorder, right  M67.971 DG Foot Complete Right       Plan  Discussed exam findings and x-ray with the patient.  Discussed she has a large inferior calcaneal spur  however she does not have a large posterior calcaneal spur at the area of tenderness.  Discussed that her pain is within the tendon itself.  At today's visit I recommended boot immobilization to give the tendon time to rest as well as nitroglycerin patch to wear on the tendon to promote blood flow.  She will wear the boot for 2 to 3 weeks and start to wean out of it thereafter.  She will finish out the celebrex.  If she continues to have pain she will call otherwise this should be self resolving and she will follow-up with Dr. Adah Perl at the next scheduled visit to see how she is doing.

## 2022-09-14 ENCOUNTER — Encounter: Payer: Self-pay | Admitting: Podiatrist

## 2022-09-14 MED ORDER — NITROGLYCERIN 0.4 MG/HR TD PT24: 0.4000 mg | MEDICATED_PATCH | Freq: Every day | TRANSDERMAL | 12 refills | Status: DC

## 2022-09-16 ENCOUNTER — Telehealth: Payer: Self-pay | Admitting: Podiatrist

## 2022-09-16 NOTE — Telephone Encounter (Signed)
Called patient and explained that the physician is suggesting that she wear the boot given, Darco shoe will not help with achilles tendinitis issues. She said that she did not get any instructions on how to wear the boot, is afraid of losing her balance wearing the boot, explained that she should wear a shoe that is the same height as the boot,  or may use a cane to help her adjust,if needing help putting on the boot and adjusting,can come in office, will be someone here to help.  She verbalized understanding but was not happy.

## 2022-09-16 NOTE — Telephone Encounter (Signed)
Please advise 

## 2022-09-16 NOTE — Telephone Encounter (Signed)
Patient called she is having issues with the boot that she was given, it is too heavy for her to walk in, is there something lighter she can get.  Can patient try a Darco shoe?

## 2022-09-23 DIAGNOSIS — L400 Psoriasis vulgaris: Secondary | ICD-10-CM | POA: Diagnosis not present

## 2022-09-25 ENCOUNTER — Ambulatory Visit (INDEPENDENT_AMBULATORY_CARE_PROVIDER_SITE_OTHER): Payer: Medicare Other | Admitting: Gastroenterology

## 2022-09-25 ENCOUNTER — Encounter: Payer: Self-pay | Admitting: Gastroenterology

## 2022-09-25 VITALS — BP 100/74 | HR 80 | Ht 60.0 in | Wt 155.2 lb

## 2022-09-25 DIAGNOSIS — I25118 Atherosclerotic heart disease of native coronary artery with other forms of angina pectoris: Secondary | ICD-10-CM | POA: Diagnosis not present

## 2022-09-25 DIAGNOSIS — R195 Other fecal abnormalities: Secondary | ICD-10-CM

## 2022-09-25 MED ORDER — PLENVU 140 G PO SOLR: 1.0000 | Freq: Once | ORAL | 0 refills | Status: AC

## 2022-09-25 NOTE — Progress Notes (Signed)
Assessment and plan noted ?

## 2022-09-25 NOTE — Patient Instructions (Addendum)
If you are age 77 or older, your body mass index should be between 23-30. Your Body mass index is 30.32 kg/m. If this is out of the aforementioned range listed, please consider follow up with your Primary Care Provider.  If you are age 100 or younger, your body mass index should be between 19-25. Your Body mass index is 30.32 kg/m. If this is out of the aformentioned range listed, please consider follow up with your Primary Care Provider.   ________________________________________________________   Heidi Franklin have been scheduled for a colonoscopy. Please follow written instructions given to you at your visit today.  Please pick up your prep supplies at the pharmacy within the next 1-3 days. If you use inhalers (even only as needed), please bring them with you on the day of your procedure.   Thank you for entrusting me with your care and for choosing [employer], [facility]on[facility] - C.

## 2022-09-25 NOTE — Progress Notes (Signed)
09/25/2022 Heidi Franklin [id number] 1946/10/05   HISTORY OF PRESENT ILLNESS: This is a 76 year old female who is a patient of Dr. Blanch Media.  Follows here for her issues with GERD and gastroparesis, but is here today to discuss colonoscopy for positive fecal immunochemistry test.  Her last colonoscopy was March 2015 and was normal at that time.  Hemoglobin 11.4 g with a normal MCV and normal iron studies.  She denies seeing any blood in her stools or dark black stools.  She says that she does feel somewhat fatigued, but knows that some of it may be her age.  Vitamin B12 and vitamin D levels are also normal.   Past Medical History:  Diagnosis Date   ALLERGIC RHINITIS    Allergy    Anxiety state, unspecified    Arthritis    Asthma    BRONCHITIS, CHRONIC    Cataract    b/l   Chronic low back pain 10/09/2016   DEPRESSION    DIABETES MELLITUS, TYPE II    GERD    HYPERLIPIDEMIA    HYPERTENSION    Pneumonia    Psoriasis    Past Surgical History:  Procedure Laterality Date   ABDOMINAL HYSTERECTOMY  1980   CARPAL TUNNEL RELEASE Right    COLONOSCOPY     TRIGGER FINGER RELEASE Left    Index finger   UPPER GASTROINTESTINAL ENDOSCOPY      reports that she has never smoked. She has never been exposed to tobacco smoke. She has never used smokeless tobacco. She reports that she does not drink alcohol and does not use drugs. family history includes Angina in her mother; Arthritis in her sister; CAD in her sister and sister; COPD in her sister; Depression in her mother; Diabetes in her brother, brother, father, and sister; Heart disease in her mother; Hyperlipidemia in her brother, brother, mother, sister, sister, and another family member; Hypertension in her brother, brother, mother, sister, sister, and another family member; Stroke in her mother. Allergies  Allergen Reactions   Metformin And Related Diarrhea   Penicillins Rash      Outpatient Encounter Medications as of 09/25/2022   Medication Sig   acetaminophen (TYLENOL) 325 MG tablet Take 650 mg by mouth every 6 (six) hours as needed for mild pain or fever.   albuterol (VENTOLIN HFA) 108 (90 Base) MCG/ACT inhaler INHALE 1-2 PUFFS INTO THE LUNGS EVERY 6 (SIX) HOURS AS NEEDED FOR WHEEZING OR SHORTNESS OF BREATH.   ALPRAZolam (XANAX) 0.5 MG tablet TAKE 1 TABLET BY MOUTH TWICE A DAY AS NEEDED FOR ANXIETY   amLODipine (NORVASC) 10 MG tablet TAKE 1 TABLET BY MOUTH EVERY DAY   aspirin 81 MG chewable tablet Chew 81 mg by mouth daily.   betamethasone dipropionate (DIPROLENE) 0.05 % ointment Apply to affected areas twice a day and then as needed flare/itch   Blood Pressure Monitoring (ADULT BLOOD PRESSURE CUFF LG) KIT Check blood pressure as needed.   Cholecalciferol (VITAMIN D3) 50 MCG (2000 UT) capsule Take 2,000 Units by mouth daily.   famotidine (PEPCID) 40 MG tablet TAKE 1 TABLET BY MOUTH EVERY DAY   fluticasone (CUTIVATE) 0.05 % cream APPLY TO AFFECTED AREA TWICE A DAY 1 WEEK ON 1 WEEK OFF PRN FLARE   fluticasone (FLONASE) 50 MCG/ACT nasal spray SPRAY 2 SPRAYS INTO EACH NOSTRIL EVERY DAY   glimepiride (AMARYL) 1 MG tablet TAKE 1 TABLET BY MOUTH EVERY DAY WITH BREAKFAST   glucose blood (ONE TOUCH ULTRA TEST) test strip  1 each by Other route daily. Use 1 strips to check blood sugar twice a day Ex E11.9   HUMIRA PEN-PS/UV/ADOL HS START 40 MG/0.8ML PNKT Every 2 weeks   HYDROcodone bit-homatropine (HYCODAN) 5-1.5 MG/5ML syrup Take 5 mLs by mouth at bedtime as needed for cough.   HYDROcodone-acetaminophen (NORCO/VICODIN) 5-325 MG tablet Take 1 tablet by mouth every 8 (eight) hours as needed.   isosorbide mononitrate (IMDUR) 30 MG 24 hr tablet Take 1 tablet (30 mg total) by mouth daily.   ketoconazole (NIZORAL) 2 % cream    Lancets (ONETOUCH ULTRASOFT) lancets PRN   levocetirizine (XYZAL) 5 MG tablet TAKE 1 TABLET BY MOUTH EVERY DAY IN THE EVENING   metoprolol succinate (TOPROL-XL) 50 MG 24 hr tablet Take 1 tablet (50 mg total)  by mouth daily. Take with or immediately following a meal.   mometasone (ELOCON) 0.1 % ointment    montelukast (SINGULAIR) 10 MG tablet TAKE 1 TABLET BY MOUTH EVERYDAY AT BEDTIME   Multiple Vitamins-Minerals (CENTRUM SILVER PO) Take 1 tablet by mouth every morning.   nitroGLYCERIN (NITRO-DUR) 0.4 mg/hr patch Place 1 patch (0.4 mg total) onto the skin daily. Place on achilles tendon once a day-   pantoprazole (PROTONIX) 40 MG tablet Take 1 tablet (40 mg total) by mouth 2 (two) times daily.   Potassium Chloride ER 20 MEQ TBCR TAKE 1 TABLET BY MOUTH EVERY DAY   promethazine-dextromethorphan (PROMETHAZINE-DM) 6.25-15 MG/5ML syrup Take 5 mLs by mouth 2 (two) times daily as needed for cough (during day).   rosuvastatin (CRESTOR) 20 MG tablet Take 10 mg by mouth daily.   tiZANidine (ZANAFLEX) 2 MG tablet Take 0.5-2 tablets (1-4 mg total) by mouth every 8 (eight) hours as needed for muscle spasms.   triamcinolone cream (KENALOG) 0.1 % Apply topically 2 (two) times daily as needed.   valACYclovir (VALTREX) 500 MG tablet Take 500 mg by mouth 2 (two) times daily.   nitroGLYCERIN (NITROSTAT) 0.4 MG SL tablet Place 1 tablet (0.4 mg total) under the tongue every 5 (five) minutes as needed.   No facility-administered encounter medications on file as of 09/25/2022.     REVIEW OF SYSTEMS  : All other systems reviewed and negative except where noted in the History of Present Illness.   PHYSICAL EXAM: BP 100/74   Pulse 80   Ht 5' (1.524 m)   Wt 155 lb 4 oz (70.4 kg)   BMI 30.32 kg/m  General: Well developed female in no acute distress Head: Normocephalic and atraumatic Eyes:  Sclerae anicteric, conjunctiva pink. Ears: Normal auditory acuity Lungs: Clear throughout to auscultation; no W/R/R. Heart: Regular rate and rhythm; no M/R/G. Abdomen: Soft, non-distended.  BS present.  Non-tender.   Rectal:  Will be done at the time of colonoscopy. Musculoskeletal: Symmetrical with no gross deformities  Skin:  No lesions on visible extremities Extremities: No edema  Neurological: Alert oriented x 4, grossly non-focal Psychological:  Alert and cooperative. Normal mood and affect  ASSESSMENT AND PLAN: *Positive fecal immunochemistry test: This was performed by her PCP.  Her last colonoscopy was almost 9 years ago.  Will schedule with Dr. Henrene Pastor.  She denies seeing any blood in her stools.  The risks, benefits, and alternatives to colonoscopy were discussed with the patient and she consents to proceed.    CC:  [name], MD

## 2022-10-01 ENCOUNTER — Ambulatory Visit: Payer: Medicare Other | Attending: Cardiology | Admitting: Cardiology

## 2022-10-01 ENCOUNTER — Encounter: Payer: Self-pay | Admitting: Cardiology

## 2022-10-01 VITALS — BP 110/64 | HR 64 | Ht 65.0 in | Wt 156.0 lb

## 2022-10-01 DIAGNOSIS — J479 Bronchiectasis, uncomplicated: Secondary | ICD-10-CM | POA: Diagnosis not present

## 2022-10-01 DIAGNOSIS — E1165 Type 2 diabetes mellitus with hyperglycemia: Secondary | ICD-10-CM | POA: Diagnosis not present

## 2022-10-01 DIAGNOSIS — I2510 Atherosclerotic heart disease of native coronary artery without angina pectoris: Secondary | ICD-10-CM | POA: Diagnosis not present

## 2022-10-01 DIAGNOSIS — I10 Essential (primary) hypertension: Secondary | ICD-10-CM | POA: Insufficient documentation

## 2022-10-01 NOTE — Progress Notes (Signed)
Cardiology Office Note:    Date:  10/01/2022   ID:  Heidi Franklin, DOB 07-29-1946, MRN [id number]  PCP:  [name], MD  Cardiologist:  Jenne Campus, MD    Referring MD: [name], MD   Chief Complaint  Patient presents with   Follow-up  Still have some chest pain but pain is relieved by Maalox  History of Present Illness:    Heidi Franklin is a 76 y.o. female past medical history significant for coronary artery disease in January 01, 2022.  Coronary CT angio done which showed calcium score being elevated she is in the 83rd percentile, there was stenosis of left anterior descending artery 50 to 69% stenosis, ramus branch a 25 to 49% stenosis diagonal 25% circumflex 25.  There were no hemodynamically significant lesion identified.  Additional problem include diabetes, dyslipidemia, essential hypertension. She comes today to my office for follow-up overall complaint still having some chest pain she said about once a week she said today when she was eating breakfast it happened.  He does not help when she walks it happens typically when she sits and sometimes when she eats.  Interestingly she said she try Maalox which helps.  She did not try nitroglycerin.  Past Medical History:  Diagnosis Date   ALLERGIC RHINITIS    Allergy    Anxiety state, unspecified    Arthritis    Asthma    BRONCHITIS, CHRONIC    Cataract    b/l   Chronic low back pain 10/09/2016   DEPRESSION    DIABETES MELLITUS, TYPE II    GERD    HYPERLIPIDEMIA    HYPERTENSION    Pneumonia    Psoriasis     Past Surgical History:  Procedure Laterality Date   ABDOMINAL HYSTERECTOMY  1980   CARPAL TUNNEL RELEASE Right    COLONOSCOPY     TRIGGER FINGER RELEASE Left    Index finger   UPPER GASTROINTESTINAL ENDOSCOPY      Current Medications: Current Meds  Medication Sig   acetaminophen (TYLENOL) 325 MG tablet Take 650 mg by mouth every 6 (six) hours as needed for mild pain or fever.   albuterol  (VENTOLIN HFA) 108 (90 Base) MCG/ACT inhaler INHALE 1-2 PUFFS INTO THE LUNGS EVERY 6 (SIX) HOURS AS NEEDED FOR WHEEZING OR SHORTNESS OF BREATH.   ALPRAZolam (XANAX) 0.5 MG tablet TAKE 1 TABLET BY MOUTH TWICE A DAY AS NEEDED FOR ANXIETY (Patient taking differently: Take 0.5 mg by mouth at bedtime as needed for anxiety.)   amLODipine (NORVASC) 10 MG tablet TAKE 1 TABLET BY MOUTH EVERY DAY (Patient taking differently: Take 10 mg by mouth daily.)   aspirin 81 MG chewable tablet Chew 81 mg by mouth daily.   betamethasone dipropionate (DIPROLENE) 0.05 % ointment Apply 1 application  topically 2 (two) times daily.   Blood Pressure Monitoring (ADULT BLOOD PRESSURE CUFF LG) KIT Check blood pressure as needed. (Patient taking differently: 1 each by Other route as needed (Check blood pressure). Check blood pressure as needed.)   Cholecalciferol (VITAMIN D3) 50 MCG (2000 UT) capsule Take 2,000 Units by mouth daily.   famotidine (PEPCID) 40 MG tablet TAKE 1 TABLET BY MOUTH EVERY DAY (Patient taking differently: Take 40 mg by mouth daily.)   fluticasone (CUTIVATE) 0.05 % cream Apply 1 Application topically 2 (two) times daily.   fluticasone (FLONASE) 50 MCG/ACT nasal spray SPRAY 2 SPRAYS INTO EACH NOSTRIL EVERY DAY (Patient taking differently: Place 2 sprays into both  nostrils daily.)   glimepiride (AMARYL) 1 MG tablet TAKE 1 TABLET BY MOUTH EVERY DAY WITH BREAKFAST (Patient taking differently: Take 1 mg by mouth daily with breakfast.)   glucose blood (ONE TOUCH ULTRA TEST) test strip 1 each by Other route daily. Use 1 strips to check blood sugar twice a day Ex E11.9   HUMIRA PEN-PS/UV/ADOL HS START 40 MG/0.8ML PNKT Inject 1 Dose into the skin every 14 (fourteen) days. Every 2 weeks   HYDROcodone bit-homatropine (HYCODAN) 5-1.5 MG/5ML syrup Take 5 mLs by mouth at bedtime as needed for cough.   isosorbide mononitrate (IMDUR) 30 MG 24 hr tablet Take 1 tablet (30 mg total) by mouth daily.   ketoconazole (NIZORAL) 2 %  cream Apply 1 Application topically daily as needed for irritation.   Lancets (ONETOUCH ULTRASOFT) lancets PRN (Patient taking differently: 1 each by Other route as needed for other (glucose check). PRN)   levocetirizine (XYZAL) 5 MG tablet TAKE 1 TABLET BY MOUTH EVERY DAY IN THE EVENING (Patient taking differently: Take 5 mg by mouth every evening.)   metoprolol succinate (TOPROL-XL) 50 MG 24 hr tablet Take 1 tablet (50 mg total) by mouth daily. Take with or immediately following a meal.   mometasone (ELOCON) 0.1 % ointment Apply 1 application  topically daily.   montelukast (SINGULAIR) 10 MG tablet TAKE 1 TABLET BY MOUTH EVERYDAY AT BEDTIME (Patient taking differently: Take 10 mg by mouth at bedtime.)   Multiple Vitamins-Minerals (CENTRUM SILVER PO) Take 1 tablet by mouth every Heidi.   nitroGLYCERIN (NITRO-DUR) 0.4 mg/hr patch Place 1 patch (0.4 mg total) onto the skin daily. Place on achilles tendon once a day-   nitroGLYCERIN (NITROSTAT) 0.4 MG SL tablet Place 1 tablet (0.4 mg total) under the tongue every 5 (five) minutes as needed. (Patient taking differently: Place 0.4 mg under the tongue every 5 (five) minutes as needed for chest pain.)   pantoprazole (PROTONIX) 40 MG tablet Take 1 tablet (40 mg total) by mouth 2 (two) times daily.   Potassium Chloride ER 20 MEQ TBCR TAKE 1 TABLET BY MOUTH EVERY DAY (Patient taking differently: Take 1 tablet by mouth daily. TAKE 1 TABLET BY MOUTH EVERY DAY)   promethazine-dextromethorphan (PROMETHAZINE-DM) 6.25-15 MG/5ML syrup Take 5 mLs by mouth 2 (two) times daily as needed for cough (during day).   rosuvastatin (CRESTOR) 20 MG tablet Take 10 mg by mouth daily.   tiZANidine (ZANAFLEX) 2 MG tablet Take 0.5-2 tablets (1-4 mg total) by mouth every 8 (eight) hours as needed for muscle spasms.   triamcinolone cream (KENALOG) 0.1 % Apply 1 Application topically 2 (two) times daily as needed (rash).   valACYclovir (VALTREX) 500 MG tablet Take 500 mg by mouth 2  (two) times daily.     Allergies:   Metformin and related and Penicillins   Social History   Socioeconomic History   Marital status: Married    Spouse name: Not on file   Number of children: 0   Years of education: [employer]yr[employer]   Highest education level: Not on file  Occupational History   Occupation: Retired   Tobacco Use   Smoking status: Never    Passive exposure: Never   Smokeless tobacco: Never  Vaping Use   Vaping Use: Never used  Substance and Sexual Activity   Alcohol use: No    Alcohol/week: 0.0 standard drinks of alcohol   Drug use: No   Sexual activity: Not Currently  Other Topics Concern   Not on file  Social History Narrative   Married, lives with spouse-retired from [employer] insurance   Not employed    Drinks coffee occasional, Consumes 1 soda a day    No dietary restrictions   Social Determinants of Health   Financial Resource Strain: Low Risk  (04/25/2021)   Overall Financial Resource Strain (CARDIA)    Difficulty of Paying Living Expenses: Not very hard  Food Insecurity: No Food Insecurity (04/25/2021)   Hunger Vital Sign    Worried About Running Out of Food in the Last Year: Never true    Ran Out of Food in the Last Year: Never true  Transportation Needs: No Transportation Needs (04/25/2021)   PRAPARE - Hydrologist (Medical): No    Lack of Transportation (Non-Medical): No  Physical Activity: Inactive (04/25/2021)   Exercise Vital Sign    Days of Exercise per Week: 0 days    Minutes of Exercise per Session: 0 min  Stress: Stress Concern Present (04/25/2021)   [place]    Feeling of Stress : To some extent  Social Connections: Socially Integrated (04/25/2021)   Social Connection and Isolation Panel [NHANES]    Frequency of Communication with Friends and Family: More than three times a week    Frequency of Social Gatherings with Friends and Family: More than  three times a week    Attends Religious Services: More than 4 times per year    Active Member of [employer] or Organizations: Yes    Attends Archivist Meetings: 1 to 4 times per year    Marital Status: Married     Family History: The patient's family history includes Angina in her mother; Arthritis in her sister; CAD in her sister and sister; COPD in her sister; Depression in her mother; Diabetes in her brother, brother, father, and sister; Heart disease in her mother; Hyperlipidemia in her brother, brother, mother, sister, sister, and another family member; Hypertension in her brother, brother, mother, sister, sister, and another family member; Stroke in her mother. There is no history of Colon cancer, Esophageal cancer, Rectal cancer, Stomach cancer, or Pancreatic cancer. ROS:   Please see the history of present illness.    All 14 point review of systems negative except as described per history of present illness  EKGs/Labs/Other Studies Reviewed:      Recent Labs: 05/31/2022: Magnesium 1.7 07/04/2022: ALT 13; BUN 15; Creatinine, Ser 0.96; Potassium 4.1; Sodium 142; TSH 3.51 08/15/2022: Hemoglobin 11.4; Platelets 282.0  Recent Lipid Panel    Component Value Date/Time   CHOL 126 07/04/2022 0939   TRIG 139.0 07/04/2022 0939   HDL 36.70 (L) 07/04/2022 0939   CHOLHDL 3 07/04/2022 0939   VLDL 27.8 07/04/2022 0939   LDLCALC 61 07/04/2022 0939   LDLDIRECT 155.1 02/09/2013 1113    Physical Exam:    VS:  BP 110/64 (BP Location: Left Arm, Patient Position: Sitting)   Pulse 64   Ht _0  (1.651 m)   Wt 156 lb (70.8 kg)   SpO2 95%   BMI 25.96 kg/m     Wt Readings from Last 3 Encounters:  10/01/22 156 lb (70.8 kg)  09/25/22 155 lb 4 oz (70.4 kg)  07/04/22 155 lb 6.4 oz (70.5 kg)     GEN:  Well nourished, well developed in no acute distress HEENT: Normal NECK: No JVD; No carotid bruits LYMPHATICS: No lymphadenopathy CARDIAC: RRR, no murmurs, no rubs, no  gallops RESPIRATORY:  Clear  to auscultation without rales, wheezing or rhonchi  ABDOMEN: Soft, non-tender, non-distended MUSCULOSKELETAL:  No edema; No deformity  SKIN: Warm and dry LOWER EXTREMITIES: no swelling NEUROLOGIC:  Alert and oriented x 3 PSYCHIATRIC:  Normal affect   ASSESSMENT:    1. Coronary artery disease involving native coronary artery of native heart without angina pectoris   2. Essential hypertension   3. Bronchiectasis without complication ([facility])   4. Type 2 diabetes mellitus with hyperglycemia, without long-term current use of insulin (HCC)    PLAN:    In order of problems listed above:  Coronary disease she does have multiple arteries affected but none of this appears to be hemodynamically significant.  She is on antiplatelet therapy which I will continue she is also on multiple antianginal medication which include amlodipine as well as long-acting nitrates and beta-blocker.  However his symptomatology she described to me today look more GI than cardiac.  I told her to take nitroglycerin for pain if she had to see if it helps if it does not she is to take Maalox and Mylanta.  She is being followed by GI specialist.  She is taking already Pepcid. Dyslipidemia she is on a statin with Crestor 20 which I will continue, I did review K PN which show me her LDL 61 HDL 36.  Will continue present management. Essential hypertension blood pressure is well-controlled continue present management. Diabetes mellitus: Did review K PN which show me her hemoglobin A1c is 6.7 which is decently controlled.   Medication Adjustments/Labs and Tests Ordered: Current medicines are reviewed at length with the patient today.  Concerns regarding medicines are outlined above.  No orders of the defined types were placed in this encounter.  Medication changes: No orders of the defined types were placed in this encounter.   Signed, Park Liter, MD, [facility] 10/01/2022 2:20 PM    [place]

## 2022-10-01 NOTE — Patient Instructions (Signed)

## 2022-10-07 DIAGNOSIS — L400 Psoriasis vulgaris: Secondary | ICD-10-CM | POA: Diagnosis not present

## 2022-10-08 ENCOUNTER — Ambulatory Visit (INDEPENDENT_AMBULATORY_CARE_PROVIDER_SITE_OTHER): Payer: Medicare Other | Admitting: Family Medicine

## 2022-10-08 ENCOUNTER — Ambulatory Visit: Payer: Self-pay

## 2022-10-08 VITALS — BP 136/68 | HR 88 | Ht 65.0 in | Wt 156.0 lb

## 2022-10-08 DIAGNOSIS — G8929 Other chronic pain: Secondary | ICD-10-CM

## 2022-10-08 DIAGNOSIS — M1711 Unilateral primary osteoarthritis, right knee: Secondary | ICD-10-CM

## 2022-10-08 DIAGNOSIS — I2510 Atherosclerotic heart disease of native coronary artery without angina pectoris: Secondary | ICD-10-CM | POA: Diagnosis not present

## 2022-10-08 DIAGNOSIS — M25561 Pain in right knee: Secondary | ICD-10-CM

## 2022-10-08 NOTE — Progress Notes (Signed)
   I, Peterson Lombard, LAT, ATC acting as a scribe for Lynne Leader, MD.  Heidi Franklin is a 76 y.o. female who presents to [place] [facility] today for cont'd R knee pain. Pt was last seen by Dr. Georgina Snell on 02/27/22 and was given a R knee steroid injection and was advised to use Voltaren gel. Today, pt reports R knee pain has returned over the past 2 weeks. Pt has difficulty transitioning to stand and getting out of her car.   Dx imaging: 09/22/21 R & L knee XR             04/10/16 L knee XR  Pertinent review of systems: No fevers or chills  Relevant historical information: Diabetes   Exam:  BP 136/68   Pulse 88   Ht '5\' 5"'$  (1.651 m)   Wt 156 lb (70.8 kg)   SpO2 95%   BMI 25.96 kg/m  General: Well Developed, well nourished, and in no acute distress.   MSK: Right knee: Mild to moderate effusion.  Normal motion with crepitation.  Tender palpation medial joint line.  Intact strength.    Lab and Radiology Results  Procedure: Real-time Ultrasound Guided Injection of right knee superior lateral patellar space Device: Philips Affiniti 50G Images permanently stored and available for review in PACS Verbal informed consent obtained.  Discussed risks and benefits of procedure. Warned about infection, bleeding, hyperglycemia damage to structures among others. Patient expresses understanding and agreement Time-out conducted.   Noted no overlying erythema, induration, or other signs of local infection.   Skin prepped in a sterile fashion.   Local anesthesia: Topical Ethyl chloride.   With sterile technique and under real time ultrasound guidance: 40 mg of Kenalog and 2 mL Marcaine injected into knee joint. Fluid seen entering the joint capsule.   Completed without difficulty   Pain immediately resolved suggesting accurate placement of the medication.   Advised to call if fevers/chills, erythema, induration, drainage, or persistent bleeding.   Images permanently stored  and available for review in the ultrasound unit.  Impression: Technically successful ultrasound guided injection.       Assessment and Plan: 76 y.o. female with right knee pain due to exacerbation of chronic pain related to DJD.  Plan for steroid injection today.  Check back as needed.   PDMP not reviewed this encounter. Orders Placed This Encounter  Procedures   [place] LIMITED JOINT SPACE STRUCTURES LOW RIGHT(NO LINKED CHARGES)    Order Specific Question:   Reason for Exam (SYMPTOM  OR DIAGNOSIS REQUIRED)    Answer:   right knee pain    Order Specific Question:   Preferred imaging location?    Answer:   [place]   No orders of the defined types were placed in this encounter.    Discussed warning signs or symptoms. Please see discharge instructions. Patient expresses understanding.   The above documentation has been reviewed and is accurate and complete Lynne Leader, M.D.

## 2022-10-08 NOTE — Patient Instructions (Signed)
Thank you for coming in today.   Call or go to the ER if you develop a large red swollen joint with extreme pain or oozing puss.    Recheck as needed.    

## 2022-10-17 ENCOUNTER — Ambulatory Visit (INDEPENDENT_AMBULATORY_CARE_PROVIDER_SITE_OTHER): Payer: Medicare Other | Admitting: Sports Medicine

## 2022-10-17 ENCOUNTER — Ambulatory Visit (INDEPENDENT_AMBULATORY_CARE_PROVIDER_SITE_OTHER): Payer: Medicare Other

## 2022-10-17 VITALS — BP 140/72 | HR 87 | Ht 65.0 in | Wt 156.0 lb

## 2022-10-17 DIAGNOSIS — M1712 Unilateral primary osteoarthritis, left knee: Secondary | ICD-10-CM

## 2022-10-17 DIAGNOSIS — G8929 Other chronic pain: Secondary | ICD-10-CM | POA: Diagnosis not present

## 2022-10-17 DIAGNOSIS — M25562 Pain in left knee: Secondary | ICD-10-CM | POA: Diagnosis not present

## 2022-10-17 NOTE — Patient Instructions (Addendum)
Good to see you  Knee HEP 3-4 week follow up with Dr. Glennon Mac

## 2022-10-17 NOTE — Progress Notes (Signed)
[name] D.[place] Phone: [phone]   Assessment and Plan:     1. Chronic pain of left knee 2. Primary osteoarthritis of left knee  -Chronic with exacerbation, initial sports medicine visit - Flare of knee pain after fall from chair 2 days ago consistent with flare of osteoarthritis based on HPI, physical exam, x-ray - X-ray obtained in clinic.  My interpretation: No acute fracture or dislocation.  Significant tricompartmental cortical changes - Patient elected for intra-articular CSI.  Tolerated well per note below.  Corticosteroid may temporarily increase blood glucose in patient with DM type II - Start HEP  Procedure: Knee Joint Injection Side: Left Indication: Flare of osteoarthritis from fall  Risks explained and consent was given verbally. The site was cleaned with alcohol prep. A needle was introduced with an anterio-lateral approach. Injection given using 43m of 1% lidocaine without epinephrine and 149mof kenalog 408ml. This was well tolerated and resulted in symptomatic relief.  Needle was removed, hemostasis achieved, and post injection instructions were explained.   Pt was advised to call or return to clinic if these symptoms worsen or fail to improve as anticipated.   Pertinent previous records reviewed include none   Follow Up: 3 to 4 weeks for reevaluation.  Could consider alternative injections versus PT versus advanced imaging based on symptoms   Subjective:   I, Moenique Parris, am serving as a scrEducation administratorr Doctor BenGlennon Machief Complaint: left knee pain   HPI:   10/17/22 Patient is a 76 90ar old female complaining of left knee pain. Patient states that she fell a couple of days ago and now she I sore medial knee , she fell off her office chair onto wood , no numbness or tingling, just pain, pain radiates down to her calf, no meds for the pain   Relevant Historical  Information: Hypertension, CAD, GERD, DM type II  Additional pertinent review of systems negative.   Current Outpatient Medications:    acetaminophen (TYLENOL) 325 MG tablet, Take 650 mg by mouth every 6 (six) hours as needed for mild pain or fever., Disp: , Rfl:    albuterol (VENTOLIN HFA) 108 (90 Base) MCG/ACT inhaler, INHALE 1-2 PUFFS INTO THE LUNGS EVERY 6 (SIX) HOURS AS NEEDED FOR WHEEZING OR SHORTNESS OF BREATH., Disp: 8.5 each, Rfl: 3   ALPRAZolam (XANAX) 0.5 MG tablet, TAKE 1 TABLET BY MOUTH TWICE A DAY AS NEEDED FOR ANXIETY (Patient taking differently: Take 0.5 mg by mouth at bedtime as needed for anxiety.), Disp: 60 tablet, Rfl: 1   amLODipine (NORVASC) 10 MG tablet, TAKE 1 TABLET BY MOUTH EVERY DAY (Patient taking differently: Take 10 mg by mouth daily.), Disp: 90 tablet, Rfl: 1   aspirin 81 MG chewable tablet, Chew 81 mg by mouth daily., Disp: , Rfl:    betamethasone dipropionate (DIPROLENE) 0.05 % ointment, Apply 1 application  topically 2 (two) times daily., Disp: , Rfl:    Blood Pressure Monitoring (ADULT BLOOD PRESSURE CUFF LG) KIT, Check blood pressure as needed. (Patient taking differently: 1 each by Other route as needed (Check blood pressure). Check blood pressure as needed.), Disp: 1 kit, Rfl: 0   Cholecalciferol (VITAMIN D3) 50 MCG (2000 UT) capsule, Take 2,000 Units by mouth daily., Disp: , Rfl:    famotidine (PEPCID) 40 MG tablet, TAKE 1 TABLET BY MOUTH EVERY DAY (Patient taking differently: Take 40 mg by mouth daily.), Disp: 90 tablet, Rfl: 1  fluticasone (CUTIVATE) 0.05 % cream, Apply 1 Application topically 2 (two) times daily., Disp: , Rfl:    fluticasone (FLONASE) 50 MCG/ACT nasal spray, SPRAY 2 SPRAYS INTO EACH NOSTRIL EVERY DAY (Patient taking differently: Place 2 sprays into both nostrils daily.), Disp: 48 mL, Rfl: 2   glimepiride (AMARYL) 1 MG tablet, TAKE 1 TABLET BY MOUTH EVERY DAY WITH BREAKFAST (Patient taking differently: Take 1 mg by mouth daily with  breakfast.), Disp: 90 tablet, Rfl: 1   glucose blood (ONE TOUCH ULTRA TEST) test strip, 1 each by Other route daily. Use 1 strips to check blood sugar twice a day Ex E11.9, Disp: 100 each, Rfl: 3   HUMIRA PEN-PS/UV/ADOL HS START 40 MG/0.8ML PNKT, Inject 1 Dose into the skin every 14 (fourteen) days. Every 2 weeks, Disp: , Rfl:    HYDROcodone bit-homatropine (HYCODAN) 5-1.5 MG/5ML syrup, Take 5 mLs by mouth at bedtime as needed for cough., Disp: 120 mL, Rfl: 0   HYDROcodone-acetaminophen (NORCO/VICODIN) 5-325 MG tablet, Take 1 tablet by mouth every 8 (eight) hours as needed. (Patient taking differently: Take 1 tablet by mouth every 8 (eight) hours as needed for moderate pain or severe pain.), Disp: 15 tablet, Rfl: 0   isosorbide mononitrate (IMDUR) 30 MG 24 hr tablet, Take 1 tablet (30 mg total) by mouth daily., Disp: 90 tablet, Rfl: 3   ketoconazole (NIZORAL) 2 % cream, Apply 1 Application topically daily as needed for irritation., Disp: , Rfl:    Lancets (ONETOUCH ULTRASOFT) lancets, PRN (Patient taking differently: 1 each by Other route as needed for other (glucose check). PRN), Disp: 100 each, Rfl: 3   levocetirizine (XYZAL) 5 MG tablet, TAKE 1 TABLET BY MOUTH EVERY DAY IN THE EVENING (Patient taking differently: Take 5 mg by mouth every evening.), Disp: 90 tablet, Rfl: 1   metoprolol succinate (TOPROL-XL) 50 MG 24 hr tablet, Take 1 tablet (50 mg total) by mouth daily. Take with or immediately following a meal., Disp: 90 tablet, Rfl: 3   mometasone (ELOCON) 0.1 % ointment, Apply 1 application  topically daily., Disp: , Rfl:    montelukast (SINGULAIR) 10 MG tablet, TAKE 1 TABLET BY MOUTH EVERYDAY AT BEDTIME (Patient taking differently: Take 10 mg by mouth at bedtime.), Disp: 90 tablet, Rfl: 1   Multiple Vitamins-Minerals (CENTRUM SILVER PO), Take 1 tablet by mouth every morning., Disp: , Rfl:    nitroGLYCERIN (NITRO-DUR) 0.4 mg/hr patch, Place 1 patch (0.4 mg total) onto the skin daily. Place on  achilles tendon once a day-, Disp: 30 patch, Rfl: 12   pantoprazole (PROTONIX) 40 MG tablet, Take 1 tablet (40 mg total) by mouth 2 (two) times daily., Disp: 180 tablet, Rfl: 1   Potassium Chloride ER 20 MEQ TBCR, TAKE 1 TABLET BY MOUTH EVERY DAY (Patient taking differently: Take 1 tablet by mouth daily. TAKE 1 TABLET BY MOUTH EVERY DAY), Disp: 90 tablet, Rfl: 1   promethazine-dextromethorphan (PROMETHAZINE-DM) 6.25-15 MG/5ML syrup, Take 5 mLs by mouth 2 (two) times daily as needed for cough (during day)., Disp: 240 mL, Rfl: 0   rosuvastatin (CRESTOR) 20 MG tablet, Take 10 mg by mouth daily., Disp: , Rfl:    tiZANidine (ZANAFLEX) 2 MG tablet, Take 0.5-2 tablets (1-4 mg total) by mouth every 8 (eight) hours as needed for muscle spasms., Disp: 40 tablet, Rfl: 1   triamcinolone cream (KENALOG) 0.1 %, Apply 1 Application topically 2 (two) times daily as needed (rash)., Disp: , Rfl:    valACYclovir (VALTREX) 500 MG tablet, Take 500  mg by mouth 2 (two) times daily., Disp: , Rfl: 0   nitroGLYCERIN (NITROSTAT) 0.4 MG SL tablet, Place 1 tablet (0.4 mg total) under the tongue every 5 (five) minutes as needed. (Patient taking differently: Place 0.4 mg under the tongue every 5 (five) minutes as needed for chest pain.), Disp: 30 tablet, Rfl: 3   Objective:     Vitals:   10/17/22 1455  BP: (!) 140/72  Pulse: 87  SpO2: 95%  Weight: 156 lb (70.8 kg)  Height: _0  (1.651 m)      Body mass index is 25.96 kg/m.    Physical Exam:    General:  awake, alert oriented, no acute distress nontoxic Skin: no suspicious lesions or rashes Neuro:sensation intact and strength 5/5 with no deficits, no atrophy, normal muscle tone Psych: No signs of anxiety, depression or other mood disorder  Left knee: Crepitus Mild swelling No deformity Neg fluid wave, joint milking ROM Flex 95, Ext 10 TTP medial joint line, medial femoral condyle NTTP over the quad tendon,  , lat fem condyle, patella, plica, patella tendon,  tibial tuberostiy, fibular head, posterior fossa, pes anserine bursa, gerdy's tubercle,  lateral jt line Neg anterior and posterior drawer Neg lachman Neg sag sign Negative varus stress Positive valgus stress for pain, negative for laxity Negative McMurray Negative Thessaly  Gait normal    Electronically signed by:  [name] D.[facility] 4:17 PM 10/17/22

## 2022-10-23 ENCOUNTER — Telehealth: Payer: Self-pay | Admitting: Gastroenterology

## 2022-10-23 ENCOUNTER — Telehealth: Payer: Self-pay

## 2022-10-23 DIAGNOSIS — H25813 Combined forms of age-related cataract, bilateral: Secondary | ICD-10-CM | POA: Diagnosis not present

## 2022-10-23 DIAGNOSIS — H52223 Regular astigmatism, bilateral: Secondary | ICD-10-CM | POA: Diagnosis not present

## 2022-10-23 DIAGNOSIS — H04123 Dry eye syndrome of bilateral lacrimal glands: Secondary | ICD-10-CM | POA: Diagnosis not present

## 2022-10-23 DIAGNOSIS — L400 Psoriasis vulgaris: Secondary | ICD-10-CM | POA: Diagnosis not present

## 2022-10-23 DIAGNOSIS — E113293 Type 2 diabetes mellitus with mild nonproliferative diabetic retinopathy without macular edema, bilateral: Secondary | ICD-10-CM | POA: Diagnosis not present

## 2022-10-23 DIAGNOSIS — H43813 Vitreous degeneration, bilateral: Secondary | ICD-10-CM | POA: Diagnosis not present

## 2022-10-23 DIAGNOSIS — H11442 Conjunctival cysts, left eye: Secondary | ICD-10-CM | POA: Diagnosis not present

## 2022-10-23 NOTE — Telephone Encounter (Signed)
Colonoscopy rescheduled - new instructions completed and mailed to patient with new date and time.

## 2022-10-23 NOTE — Telephone Encounter (Signed)
Called patient to schedule colonoscopy left voicemail.

## 2022-10-27 ENCOUNTER — Other Ambulatory Visit: Payer: Self-pay | Admitting: Family Medicine

## 2022-11-06 DIAGNOSIS — L400 Psoriasis vulgaris: Secondary | ICD-10-CM | POA: Diagnosis not present

## 2022-11-08 ENCOUNTER — Other Ambulatory Visit: Payer: Self-pay | Admitting: Family Medicine

## 2022-11-12 ENCOUNTER — Ambulatory Visit: Payer: Medicare Other | Admitting: Podiatry

## 2022-11-14 ENCOUNTER — Ambulatory Visit: Payer: Medicare Other | Admitting: Sports Medicine

## 2022-11-16 ENCOUNTER — Other Ambulatory Visit: Payer: Self-pay | Admitting: Family Medicine

## 2022-11-18 ENCOUNTER — Encounter: Payer: Self-pay | Admitting: Podiatry

## 2022-11-18 ENCOUNTER — Ambulatory Visit (INDEPENDENT_AMBULATORY_CARE_PROVIDER_SITE_OTHER): Payer: Medicare Other | Admitting: Podiatry

## 2022-11-18 VITALS — BP 121/72

## 2022-11-18 DIAGNOSIS — E119 Type 2 diabetes mellitus without complications: Secondary | ICD-10-CM | POA: Diagnosis not present

## 2022-11-18 DIAGNOSIS — M67971 Unspecified disorder of synovium and tendon, right ankle and foot: Secondary | ICD-10-CM

## 2022-11-18 DIAGNOSIS — B351 Tinea unguium: Secondary | ICD-10-CM | POA: Diagnosis not present

## 2022-11-18 DIAGNOSIS — M79674 Pain in right toe(s): Secondary | ICD-10-CM

## 2022-11-18 DIAGNOSIS — M79675 Pain in left toe(s): Secondary | ICD-10-CM | POA: Diagnosis not present

## 2022-11-18 NOTE — Patient Instructions (Signed)

## 2022-11-18 NOTE — Progress Notes (Signed)
  Subjective:  Patient ID: Heidi Franklin, female    DOB: 1946/01/31,   MRN: [id number]  Chief Complaint  Patient presents with   Nail Problem    Nail trim    77 y.o. female presents for concern of thickened elongated and painful nails that are difficult to trim. Requesting to have them trimmed today. Relates burning and tingling in their feet. Patient is diabetic and last A1c was  Lab Results  Component Value Date   HGBA1C 6.7 (H) 03/26/2022   .   Relates right achilles is better but still has some pain occasionally. Relates the boot hurt her knee so did not wear.   PCP:  [name], MD    . Denies any other pedal complaints. Denies n/v/f/c.   Past Medical History:  Diagnosis Date   ALLERGIC RHINITIS    Allergy    Anxiety state, unspecified    Arthritis    Asthma    BRONCHITIS, CHRONIC    Cataract    b/l   Chronic low back pain 10/09/2016   DEPRESSION    DIABETES MELLITUS, TYPE II    GERD    HYPERLIPIDEMIA    HYPERTENSION    Pneumonia    Psoriasis     Objective:  Physical Exam: Vascular: DP/PT pulses 2/4 bilateral. CFT <3 seconds. Absent hair growth on digits. Edema noted to bilateral lower extremities. Xerosis noted bilaterally.  Skin. No lacerations or abrasions bilateral feet. Nails 1-5 bilateral  are thickened discolored and elongated with subungual debris.  Musculoskeletal: MMT 5/5 bilateral lower extremities in DF, PF, Inversion and Eversion. Deceased ROM in DF of ankle joint. Mild tender to insertion of achilles tendon on the right.  Neurological: Sensation intact to light touch. Protective sensation diminished bilateral.    Assessment:   1. Achilles tendon disorder, right   2. Pain due to onychomycosis of toenails of both feet   3. Diabetes mellitus without complication ([place])      Plan:  Patient was evaluated and treated and all questions answered. -Discussed and educated patient on diabetic foot care, especially with  regards to the vascular,  neurological and musculoskeletal systems.  -Stressed the importance of good glycemic control and the detriment of not  controlling glucose levels in relation to the foot. -Discussed supportive shoes at all times and checking feet regularly.  -Mechanically debrided all nails 1-5 bilateral using sterile nail nipper and filed with dremel without incident  -Dispensed heel lift for right achilles heel to see if this will help. Stretching exercises provided as well.  -Answered all patient questions -Patient to return  in 3 months for at risk foot care -Patient advised to call the office if any problems or questions arise in the meantime.   Lorenda Peck, DPM

## 2022-11-19 NOTE — Progress Notes (Unsigned)
Heidi Franklin D.[place] Phone: [phone]   Assessment and Plan:     There are no diagnoses linked to this encounter.  ***   Pertinent previous records reviewed include ***   Follow Up: ***     Subjective:   I, Heidi Franklin, am serving as a Education administrator for Heidi Franklin   Chief Complaint: left knee pain    HPI:    10/17/22 Patient is a 77 year old female complaining of left knee pain. Patient states that she fell a couple of days ago and now she I sore medial knee , she fell off her office chair onto wood , no numbness or tingling, just pain, pain radiates down to her calf, no meds for the pain   11/20/2022 Patient states    Relevant Historical Information: Hypertension, CAD, GERD, DM type II Additional pertinent review of systems negative.   Current Outpatient Medications:    acetaminophen (TYLENOL) 325 MG tablet, Take 650 mg by mouth every 6 (six) hours as needed for mild pain or fever., Disp: , Rfl:    albuterol (VENTOLIN HFA) 108 (90 Base) MCG/ACT inhaler, INHALE 1-2 PUFFS INTO THE LUNGS EVERY 6 (SIX) HOURS AS NEEDED FOR WHEEZING OR SHORTNESS OF BREATH., Disp: 8.5 each, Rfl: 3   ALPRAZolam (XANAX) 0.5 MG tablet, TAKE 1 TABLET BY MOUTH TWICE A DAY AS NEEDED FOR ANXIETY (Patient taking differently: Take 0.5 mg by mouth at bedtime as needed for anxiety.), Disp: 60 tablet, Rfl: 1   amLODipine (NORVASC) 10 MG tablet, TAKE 1 TABLET BY MOUTH EVERY DAY (Patient taking differently: Take 10 mg by mouth daily.), Disp: 90 tablet, Rfl: 1   aspirin 81 MG chewable tablet, Chew 81 mg by mouth daily., Disp: , Rfl:    betamethasone dipropionate (DIPROLENE) 0.05 % ointment, Apply 1 application  topically 2 (two) times daily., Disp: , Rfl:    Blood Pressure Monitoring (ADULT BLOOD PRESSURE CUFF LG) KIT, Check blood pressure as needed. (Patient taking differently: 1 each by Other route as needed (Check  blood pressure). Check blood pressure as needed.), Disp: 1 kit, Rfl: 0   Cholecalciferol (VITAMIN D3) 50 MCG (2000 UT) capsule, Take 2,000 Units by mouth daily., Disp: , Rfl:    famotidine (PEPCID) 40 MG tablet, TAKE 1 TABLET BY MOUTH EVERY DAY (Patient taking differently: Take 40 mg by mouth daily.), Disp: 90 tablet, Rfl: 1   fluticasone (CUTIVATE) 0.05 % cream, Apply 1 Application topically 2 (two) times daily., Disp: , Rfl:    fluticasone (FLONASE) 50 MCG/ACT nasal spray, SPRAY 2 SPRAYS INTO EACH NOSTRIL EVERY DAY (Patient taking differently: Place 2 sprays into both nostrils daily.), Disp: 48 mL, Rfl: 2   glimepiride (AMARYL) 1 MG tablet, TAKE 1 TABLET BY MOUTH EVERY DAY WITH BREAKFAST, Disp: 90 tablet, Rfl: 1   glucose blood (ONE TOUCH ULTRA TEST) test strip, 1 each by Other route daily. Use 1 strips to check blood sugar twice a day Ex E11.9, Disp: 100 each, Rfl: 3   HUMIRA PEN-PS/UV/ADOL HS START 40 MG/0.8ML PNKT, Inject 1 Dose into the skin every 14 (fourteen) days. Every 2 weeks, Disp: , Rfl:    HYDROcodone bit-homatropine (HYCODAN) 5-1.5 MG/5ML syrup, Take 5 mLs by mouth at bedtime as needed for cough., Disp: 120 mL, Rfl: 0   HYDROcodone-acetaminophen (NORCO/VICODIN) 5-325 MG tablet, Take 1 tablet by mouth every 8 (eight) hours as needed. (Patient taking differently: Take 1 tablet by  mouth every 8 (eight) hours as needed for moderate pain or severe pain.), Disp: 15 tablet, Rfl: 0   isosorbide mononitrate (IMDUR) 30 MG 24 hr tablet, Take 1 tablet (30 mg total) by mouth daily., Disp: 90 tablet, Rfl: 3   ketoconazole (NIZORAL) 2 % cream, Apply 1 Application topically daily as needed for irritation., Disp: , Rfl:    Lancets (ONETOUCH ULTRASOFT) lancets, PRN (Patient taking differently: 1 each by Other route as needed for other (glucose check). PRN), Disp: 100 each, Rfl: 3   levocetirizine (XYZAL) 5 MG tablet, TAKE 1 TABLET BY MOUTH EVERY DAY IN THE EVENING (Patient taking differently: Take 5 mg by  mouth every evening.), Disp: 90 tablet, Rfl: 1   metoprolol succinate (TOPROL-XL) 50 MG 24 hr tablet, Take 1 tablet (50 mg total) by mouth daily. Take with or immediately following a meal., Disp: 90 tablet, Rfl: 3   mometasone (ELOCON) 0.1 % ointment, Apply 1 application  topically daily., Disp: , Rfl:    montelukast (SINGULAIR) 10 MG tablet, TAKE 1 TABLET BY MOUTH EVERYDAY AT BEDTIME (Patient taking differently: Take 10 mg by mouth at bedtime.), Disp: 90 tablet, Rfl: 1   Multiple Vitamins-Minerals (CENTRUM SILVER PO), Take 1 tablet by mouth every morning., Disp: , Rfl:    nitroGLYCERIN (NITRO-DUR) 0.4 mg/hr patch, Place 1 patch (0.4 mg total) onto the skin daily. Place on achilles tendon once a day-, Disp: 30 patch, Rfl: 12   nitroGLYCERIN (NITROSTAT) 0.4 MG SL tablet, Place 1 tablet (0.4 mg total) under the tongue every 5 (five) minutes as needed. (Patient taking differently: Place 0.4 mg under the tongue every 5 (five) minutes as needed for chest pain.), Disp: 30 tablet, Rfl: 3   pantoprazole (PROTONIX) 40 MG tablet, Take 1 tablet (40 mg total) by mouth 2 (two) times daily., Disp: 180 tablet, Rfl: 1   Potassium Chloride ER 20 MEQ TBCR, TAKE 1 TABLET BY MOUTH EVERY DAY (Patient taking differently: Take 1 tablet by mouth daily. TAKE 1 TABLET BY MOUTH EVERY DAY), Disp: 90 tablet, Rfl: 1   promethazine-dextromethorphan (PROMETHAZINE-DM) 6.25-15 MG/5ML syrup, TAKE 5 MLS BY MOUTH 2 (TWO) TIMES DAILY AS NEEDED FOR COUGH (DURING DAY)., Disp: 240 mL, Rfl: 0   rosuvastatin (CRESTOR) 20 MG tablet, Take 10 mg by mouth daily., Disp: , Rfl:    tiZANidine (ZANAFLEX) 2 MG tablet, Take 0.5-2 tablets (1-4 mg total) by mouth every 8 (eight) hours as needed for muscle spasms., Disp: 40 tablet, Rfl: 1   triamcinolone cream (KENALOG) 0.1 %, Apply 1 Application topically 2 (two) times daily as needed (rash)., Disp: , Rfl:    valACYclovir (VALTREX) 500 MG tablet, Take 500 mg by mouth 2 (two) times daily., Disp: , Rfl: 0    Objective:     There were no vitals filed for this visit.    There is no height or weight on file to calculate BMI.    Physical Exam:    ***   Electronically signed by:  Heidi Franklin D.[facility] 10:09 AM 11/19/22

## 2022-11-20 ENCOUNTER — Ambulatory Visit (INDEPENDENT_AMBULATORY_CARE_PROVIDER_SITE_OTHER): Payer: Medicare Other | Admitting: Sports Medicine

## 2022-11-20 VITALS — BP 132/82 | HR 106 | Ht 65.0 in | Wt 152.0 lb

## 2022-11-20 DIAGNOSIS — M1711 Unilateral primary osteoarthritis, right knee: Secondary | ICD-10-CM

## 2022-11-20 DIAGNOSIS — M25561 Pain in right knee: Secondary | ICD-10-CM | POA: Diagnosis not present

## 2022-11-20 DIAGNOSIS — M1712 Unilateral primary osteoarthritis, left knee: Secondary | ICD-10-CM | POA: Diagnosis not present

## 2022-11-20 DIAGNOSIS — M25562 Pain in left knee: Secondary | ICD-10-CM

## 2022-11-20 DIAGNOSIS — G8929 Other chronic pain: Secondary | ICD-10-CM | POA: Diagnosis not present

## 2022-11-20 NOTE — Patient Instructions (Addendum)
Good to see you  HA injections Voltaren gel over areas of pain  Tylenol [phone] mg 2-3 times a day for pain relief  1 week follow up

## 2022-11-28 DIAGNOSIS — H02825 Cysts of left lower eyelid: Secondary | ICD-10-CM | POA: Diagnosis not present

## 2022-12-05 ENCOUNTER — Encounter: Payer: Self-pay | Admitting: Internal Medicine

## 2022-12-05 ENCOUNTER — Ambulatory Visit (AMBULATORY_SURGERY_CENTER): Payer: Medicare Other | Admitting: Internal Medicine

## 2022-12-05 VITALS — BP 103/42 | HR 94 | Temp 96.6°F | Resp 13 | Ht 60.0 in | Wt 155.0 lb

## 2022-12-05 DIAGNOSIS — D124 Benign neoplasm of descending colon: Secondary | ICD-10-CM | POA: Diagnosis not present

## 2022-12-05 DIAGNOSIS — I10 Essential (primary) hypertension: Secondary | ICD-10-CM | POA: Diagnosis not present

## 2022-12-05 DIAGNOSIS — I2510 Atherosclerotic heart disease of native coronary artery without angina pectoris: Secondary | ICD-10-CM | POA: Diagnosis not present

## 2022-12-05 DIAGNOSIS — Z1211 Encounter for screening for malignant neoplasm of colon: Secondary | ICD-10-CM

## 2022-12-05 DIAGNOSIS — E119 Type 2 diabetes mellitus without complications: Secondary | ICD-10-CM | POA: Diagnosis not present

## 2022-12-05 DIAGNOSIS — R195 Other fecal abnormalities: Secondary | ICD-10-CM

## 2022-12-05 MED ORDER — SODIUM CHLORIDE 0.9 % IV SOLN: 500.0000 mL | Freq: Once | INTRAVENOUS | Status: DC

## 2022-12-05 NOTE — Op Note (Signed)
[facility] Patient Name: Heidi Franklin Procedure Date: 12/05/2022 11:16 AM MRN: [id number] Endoscopist: Docia Chuck. Henrene Pastor , MD, [id number] Age: 77 Referring MD:  Date of Birth: [birth year] Gender: Female Account #: [id number] Procedure:                Colonoscopy with cold snare polypectomy x 1 Indications:              Screening for colorectal malignant neoplasm,                            positive FIT testing. Previous examination 2015 was                            normal Medicines:                Monitored Anesthesia Care Procedure:                Pre-Anesthesia Assessment:                           - Prior to the procedure, a History and Physical                            was performed, and patient medications and                            allergies were reviewed. The patient's tolerance of                            previous anesthesia was also reviewed. The risks                            and benefits of the procedure and the sedation                            options and risks were discussed with the patient.                            All questions were answered, and informed consent                            was obtained. Prior Anticoagulants: The patient has                            taken no anticoagulant or antiplatelet agents. ASA                            Grade Assessment: II - A patient with mild systemic                            disease. After reviewing the risks and benefits,                            the patient was deemed in satisfactory condition to  undergo the procedure.                           After obtaining informed consent, the colonoscope                            was passed under direct vision. Throughout the                            procedure, the patient's blood pressure, pulse, and                            oxygen saturations were monitored continuously. The                            Olympus CF-HQ190L  ([id number]) Colonoscope was                            introduced through the anus and advanced to the the                            cecum, identified by appendiceal orifice and                            ileocecal valve. The ileocecal valve, appendiceal                            orifice, and rectum were photographed. The quality                            of the bowel preparation was excellent. The                            colonoscopy was performed without difficulty. The                            patient tolerated the procedure well. The bowel                            preparation used was SUPREP via split dose                            instruction. Scope In: 11:29:07 AM Scope Out: 11:43:15 AM Scope Withdrawal Time: 0 hours 7 minutes 30 seconds  Total Procedure Duration: 0 hours 14 minutes 8 seconds  Findings:                 A 5 mm polyp was found in the descending colon. The                            polyp was removed with a cold snare. Resection and                            retrieval were complete.  The exam was otherwise without abnormality on                            direct and retroflexion views. Complications:            No immediate complications. Estimated blood loss:                            None. Estimated Blood Loss:     Estimated blood loss: none. Impression:               - One 5 mm polyp in the descending colon, removed                            with a cold snare. Resected and retrieved.                           - The examination was otherwise normal on direct                            and retroflexion views. Recommendation:           - Repeat colonoscopy is not recommended for                            surveillance.                           - Patient has a contact number available for                            emergencies. The signs and symptoms of potential                            delayed complications were discussed with  the                            patient. Return to normal activities tomorrow.                            Written discharge instructions were provided to the                            patient.                           - Resume previous diet.                           - Continue present medications.                           - Await pathology results. Docia Chuck. Henrene Pastor, MD 12/05/2022 11:49:47 AM This report has been signed electronically.

## 2022-12-05 NOTE — Progress Notes (Signed)
09/25/2022 Heidi Franklin [id number] 12-Nov-1945     HISTORY OF PRESENT ILLNESS: This is a 77 year old female who is a patient of Dr. Blanch Media.  Follows here for her issues with GERD and gastroparesis, but is here today to discuss colonoscopy for positive fecal immunochemistry test.  Her last colonoscopy was March 2015 and was normal at that time.  Hemoglobin 11.4 g with a normal MCV and normal iron studies.  She denies seeing any blood in her stools or dark black stools.  She says that she does feel somewhat fatigued, but knows that some of it may be her age.  Vitamin B12 and vitamin D levels are also normal.         Past Medical History:  Diagnosis Date   ALLERGIC RHINITIS     Allergy     Anxiety state, unspecified     Arthritis     Asthma     BRONCHITIS, CHRONIC     Cataract      b/l   Chronic low back pain 10/09/2016   DEPRESSION     DIABETES MELLITUS, TYPE II     GERD     HYPERLIPIDEMIA     HYPERTENSION     Pneumonia     Psoriasis           Past Surgical History:  Procedure Laterality Date   ABDOMINAL HYSTERECTOMY   1980   CARPAL TUNNEL RELEASE Right     COLONOSCOPY       TRIGGER FINGER RELEASE Left      Index finger   UPPER GASTROINTESTINAL ENDOSCOPY         reports that she has never smoked. She has never been exposed to tobacco smoke. She has never used smokeless tobacco. She reports that she does not drink alcohol and does not use drugs. family history includes Angina in her mother; Arthritis in her sister; CAD in her sister and sister; COPD in her sister; Depression in her mother; Diabetes in her brother, brother, father, and sister; Heart disease in her mother; Hyperlipidemia in her brother, brother, mother, sister, sister, and another family member; Hypertension in her brother, brother, mother, sister, sister, and another family member; Stroke in her mother.     Allergies  Allergen Reactions   Metformin And Related Diarrhea   Penicillins Rash             Outpatient Encounter Medications as of 09/25/2022  Medication Sig   acetaminophen (TYLENOL) 325 MG tablet Take 650 mg by mouth every 6 (six) hours as needed for mild pain or fever.   albuterol (VENTOLIN HFA) 108 (90 Base) MCG/ACT inhaler INHALE 1-2 PUFFS INTO THE LUNGS EVERY 6 (SIX) HOURS AS NEEDED FOR WHEEZING OR SHORTNESS OF BREATH.   ALPRAZolam (XANAX) 0.5 MG tablet TAKE 1 TABLET BY MOUTH TWICE A DAY AS NEEDED FOR ANXIETY   amLODipine (NORVASC) 10 MG tablet TAKE 1 TABLET BY MOUTH EVERY DAY   aspirin 81 MG chewable tablet Chew 81 mg by mouth daily.   betamethasone dipropionate (DIPROLENE) 0.05 % ointment Apply to affected areas twice a day and then as needed flare/itch   Blood Pressure Monitoring (ADULT BLOOD PRESSURE CUFF LG) KIT Check blood pressure as needed.   Cholecalciferol (VITAMIN D3) 50 MCG (2000 UT) capsule Take 2,000 Units by mouth daily.   famotidine (PEPCID) 40 MG tablet TAKE 1 TABLET BY MOUTH EVERY DAY   fluticasone (CUTIVATE) 0.05 % cream APPLY TO AFFECTED AREA TWICE A DAY 1 WEEK ON 1 WEEK  OFF PRN FLARE   fluticasone (FLONASE) 50 MCG/ACT nasal spray SPRAY 2 SPRAYS INTO EACH NOSTRIL EVERY DAY   glimepiride (AMARYL) 1 MG tablet TAKE 1 TABLET BY MOUTH EVERY DAY WITH BREAKFAST   glucose blood (ONE TOUCH ULTRA TEST) test strip 1 each by Other route daily. Use 1 strips to check blood sugar twice a day Ex E11.9   HUMIRA PEN-PS/UV/ADOL HS START 40 MG/0.8ML PNKT Every 2 weeks   HYDROcodone bit-homatropine (HYCODAN) 5-1.5 MG/5ML syrup Take 5 mLs by mouth at bedtime as needed for cough.   HYDROcodone-acetaminophen (NORCO/VICODIN) 5-325 MG tablet Take 1 tablet by mouth every 8 (eight) hours as needed.   isosorbide mononitrate (IMDUR) 30 MG 24 hr tablet Take 1 tablet (30 mg total) by mouth daily.   ketoconazole (NIZORAL) 2 % cream     Lancets (ONETOUCH ULTRASOFT) lancets PRN   levocetirizine (XYZAL) 5 MG tablet TAKE 1 TABLET BY MOUTH EVERY DAY IN THE EVENING   metoprolol succinate  (TOPROL-XL) 50 MG 24 hr tablet Take 1 tablet (50 mg total) by mouth daily. Take with or immediately following a meal.   mometasone (ELOCON) 0.1 % ointment     montelukast (SINGULAIR) 10 MG tablet TAKE 1 TABLET BY MOUTH EVERYDAY AT BEDTIME   Multiple Vitamins-Minerals (CENTRUM SILVER PO) Take 1 tablet by mouth every morning.   nitroGLYCERIN (NITRO-DUR) 0.4 mg/hr patch Place 1 patch (0.4 mg total) onto the skin daily. Place on achilles tendon once a day-   pantoprazole (PROTONIX) 40 MG tablet Take 1 tablet (40 mg total) by mouth 2 (two) times daily.   Potassium Chloride ER 20 MEQ TBCR TAKE 1 TABLET BY MOUTH EVERY DAY   promethazine-dextromethorphan (PROMETHAZINE-DM) 6.25-15 MG/5ML syrup Take 5 mLs by mouth 2 (two) times daily as needed for cough (during day).   rosuvastatin (CRESTOR) 20 MG tablet Take 10 mg by mouth daily.   tiZANidine (ZANAFLEX) 2 MG tablet Take 0.5-2 tablets (1-4 mg total) by mouth every 8 (eight) hours as needed for muscle spasms.   triamcinolone cream (KENALOG) 0.1 % Apply topically 2 (two) times daily as needed.   valACYclovir (VALTREX) 500 MG tablet Take 500 mg by mouth 2 (two) times daily.   nitroGLYCERIN (NITROSTAT) 0.4 MG SL tablet Place 1 tablet (0.4 mg total) under the tongue every 5 (five) minutes as needed.    No facility-administered encounter medications on file as of 09/25/2022.        REVIEW OF SYSTEMS  : All other systems reviewed and negative except where noted in the History of Present Illness.     PHYSICAL EXAM: BP 100/74   Pulse 80   Ht 5' (1.524 m)   Wt 155 lb 4 oz (70.4 kg)   BMI 30.32 kg/m  General: Well developed female in no acute distress Head: Normocephalic and atraumatic Eyes:  Sclerae anicteric, conjunctiva pink. Ears: Normal auditory acuity Lungs: Clear throughout to auscultation; no W/R/R. Heart: Regular rate and rhythm; no M/R/G. Abdomen: Soft, non-distended.  BS present.  Non-tender.   Rectal:  Will be done at the time of  colonoscopy. Musculoskeletal: Symmetrical with no gross deformities  Skin: No lesions on visible extremities Extremities: No edema  Neurological: Alert oriented x 4, grossly non-focal Psychological:  Alert and cooperative. Normal mood and affect   ASSESSMENT AND PLAN: *Positive fecal immunochemistry test: This was performed by her PCP.  Her last colonoscopy was almost 9 years ago.  Will schedule with Dr. Henrene Pastor.  She denies seeing any blood in her  stools.  The risks, benefits, and alternatives to colonoscopy were discussed with the patient and she consents to proceed.  As above.  No interval change in history or physical exam.  Now for colonoscopy

## 2022-12-05 NOTE — Progress Notes (Signed)
Called to room to assist during endoscopic procedure.  Patient ID and intended procedure confirmed with present staff. Received instructions for my participation in the procedure from the performing physician.  

## 2022-12-05 NOTE — Progress Notes (Signed)
Pt resting comfortably. VSS. Airway intact. SBAR complete to RN. All questions answered.   

## 2022-12-05 NOTE — Progress Notes (Signed)
Pt's states no medical or surgical changes since previsit or office visit. 

## 2022-12-05 NOTE — Patient Instructions (Signed)
Await pathology results.  Handout on polyps provided.  See procedure report for MD's recommendations.  YOU HAD AN ENDOSCOPIC PROCEDURE TODAY AT [facility]:   Refer to the procedure report that was given to you for any specific questions about what was found during the examination.  If the procedure report does not answer your questions, please call your gastroenterologist to clarify.  If you requested that your care partner not be given the details of your procedure findings, then the procedure report has been included in a sealed envelope for you to review at your convenience later.  YOU SHOULD EXPECT: Some feelings of bloating in the abdomen. Passage of more gas than usual.  Walking can help get rid of the air that was put into your GI tract during the procedure and reduce the bloating. If you had a lower endoscopy (such as a colonoscopy or flexible sigmoidoscopy) you may notice spotting of blood in your stool or on the toilet paper. If you underwent a bowel prep for your procedure, you may not have a normal bowel movement for a few days.  Please Note:  You might notice some irritation and congestion in your nose or some drainage.  This is from the oxygen used during your procedure.  There is no need for concern and it should clear up in a day or so.  SYMPTOMS TO REPORT IMMEDIATELY:  Following lower endoscopy (colonoscopy or flexible sigmoidoscopy):  Excessive amounts of blood in the stool  Significant tenderness or worsening of abdominal pains  Swelling of the abdomen that is new, acute  Fever of 100F or higher   For urgent or emergent issues, a gastroenterologist can be reached at any hour by calling [phone]. Do not use MyChart messaging for urgent concerns.    DIET:  We do recommend a small meal at first, but then you may proceed to your regular diet.  Drink plenty of fluids but you should avoid alcoholic beverages for 24 hours.  ACTIVITY:  You should plan to  take it easy for the rest of today and you should NOT DRIVE or use heavy machinery until tomorrow (because of the sedation medicines used during the test).    FOLLOW UP: Our staff will call the number listed on your records the next business day following your procedure.  We will call around 7:15- 8:00 am to check on you and address any questions or concerns that you may have regarding the information given to you following your procedure. If we do not reach you, we will leave a message.     If any biopsies were taken you will be contacted by phone or by letter within the next 1-3 weeks.  Please call us at [phone] if you have not heard about the biopsies in 3 weeks.    SIGNATURES/CONFIDENTIALITY: You and/or your care partner have signed paperwork which will be entered into your electronic medical record.  These signatures attest to the fact that that the information above on your After Visit Summary has been reviewed and is understood.  Full responsibility of the confidentiality of this discharge information lies with you and/or your care-partner.

## 2022-12-06 ENCOUNTER — Telehealth: Payer: Self-pay | Admitting: *Deleted

## 2022-12-06 NOTE — Telephone Encounter (Signed)
Attempted to call patient for their post-procedure follow-up call. No answer. Left voicemail.   

## 2022-12-09 ENCOUNTER — Encounter: Payer: Self-pay | Admitting: Internal Medicine

## 2022-12-09 NOTE — Progress Notes (Unsigned)
Heidi Franklin D.[place] Phone: [phone]   Assessment and Plan:     There are no diagnoses linked to this encounter.  ***   Pertinent previous records reviewed include ***   Follow Up: ***     Subjective:   I, Dayveon Halley, am serving as a Education administrator for Doctor Glennon Mac   Chief Complaint: left knee pain    HPI:    10/17/22 Patient is a 77 year old female complaining of left knee pain. Patient states that she fell a couple of days ago and now she I sore medial knee , she fell off her office chair onto wood , no numbness or tingling, just pain, pain radiates down to her calf, no meds for the pain    11/20/2022 Patient states right knee is still hurting , left is intermittent pain , keeps getting a cramp in her left ankle    12/10/2022 Patient states    Relevant Historical Information: Hypertension, CAD, GERD, DM type II  Additional pertinent review of systems negative.   Current Outpatient Medications:    acetaminophen (TYLENOL) 325 MG tablet, Take 650 mg by mouth every 6 (six) hours as needed for mild pain or fever., Disp: , Rfl:    albuterol (VENTOLIN HFA) 108 (90 Base) MCG/ACT inhaler, INHALE 1-2 PUFFS INTO THE LUNGS EVERY 6 (SIX) HOURS AS NEEDED FOR WHEEZING OR SHORTNESS OF BREATH., Disp: 8.5 each, Rfl: 3   ALPRAZolam (XANAX) 0.5 MG tablet, TAKE 1 TABLET BY MOUTH TWICE A DAY AS NEEDED FOR ANXIETY (Patient taking differently: Take 0.5 mg by mouth at bedtime as needed for anxiety.), Disp: 60 tablet, Rfl: 1   amLODipine (NORVASC) 10 MG tablet, TAKE 1 TABLET BY MOUTH EVERY DAY (Patient taking differently: Take 10 mg by mouth daily.), Disp: 90 tablet, Rfl: 1   aspirin 81 MG chewable tablet, Chew 81 mg by mouth daily., Disp: , Rfl:    betamethasone dipropionate (DIPROLENE) 0.05 % ointment, Apply 1 application  topically 2 (two) times daily., Disp: , Rfl:    Blood Pressure Monitoring (ADULT  BLOOD PRESSURE CUFF LG) KIT, Check blood pressure as needed. (Patient taking differently: 1 each by Other route as needed (Check blood pressure). Check blood pressure as needed.), Disp: 1 kit, Rfl: 0   Cholecalciferol (VITAMIN D3) 50 MCG (2000 UT) capsule, Take 2,000 Units by mouth daily., Disp: , Rfl:    famotidine (PEPCID) 40 MG tablet, TAKE 1 TABLET BY MOUTH EVERY DAY (Patient taking differently: Take 40 mg by mouth daily.), Disp: 90 tablet, Rfl: 1   fluticasone (CUTIVATE) 0.05 % cream, Apply 1 Application topically 2 (two) times daily., Disp: , Rfl:    fluticasone (FLONASE) 50 MCG/ACT nasal spray, SPRAY 2 SPRAYS INTO EACH NOSTRIL EVERY DAY (Patient taking differently: Place 2 sprays into both nostrils daily.), Disp: 48 mL, Rfl: 2   glimepiride (AMARYL) 1 MG tablet, TAKE 1 TABLET BY MOUTH EVERY DAY WITH BREAKFAST, Disp: 90 tablet, Rfl: 1   glucose blood (ONE TOUCH ULTRA TEST) test strip, 1 each by Other route daily. Use 1 strips to check blood sugar twice a day Ex E11.9, Disp: 100 each, Rfl: 3   HUMIRA PEN-PS/UV/ADOL HS START 40 MG/0.8ML PNKT, Inject 1 Dose into the skin every 14 (fourteen) days. Every 2 weeks, Disp: , Rfl:    HYDROcodone bit-homatropine (HYCODAN) 5-1.5 MG/5ML syrup, Take 5 mLs by mouth at bedtime as needed for cough., Disp: 120 mL,  Rfl: 0   HYDROcodone-acetaminophen (NORCO/VICODIN) 5-325 MG tablet, Take 1 tablet by mouth every 8 (eight) hours as needed. (Patient taking differently: Take 1 tablet by mouth every 8 (eight) hours as needed for moderate pain or severe pain.), Disp: 15 tablet, Rfl: 0   isosorbide mononitrate (IMDUR) 30 MG 24 hr tablet, Take 1 tablet (30 mg total) by mouth daily., Disp: 90 tablet, Rfl: 3   ketoconazole (NIZORAL) 2 % cream, Apply 1 Application topically daily as needed for irritation., Disp: , Rfl:    Lancets (ONETOUCH ULTRASOFT) lancets, PRN (Patient taking differently: 1 each by Other route as needed for other (glucose check). PRN), Disp: 100 each, Rfl:  3   levocetirizine (XYZAL) 5 MG tablet, TAKE 1 TABLET BY MOUTH EVERY DAY IN THE EVENING (Patient taking differently: Take 5 mg by mouth every evening.), Disp: 90 tablet, Rfl: 1   metoprolol succinate (TOPROL-XL) 50 MG 24 hr tablet, Take 1 tablet (50 mg total) by mouth daily. Take with or immediately following a meal., Disp: 90 tablet, Rfl: 3   mometasone (ELOCON) 0.1 % ointment, Apply 1 application  topically daily., Disp: , Rfl:    montelukast (SINGULAIR) 10 MG tablet, TAKE 1 TABLET BY MOUTH EVERYDAY AT BEDTIME (Patient taking differently: Take 10 mg by mouth at bedtime.), Disp: 90 tablet, Rfl: 1   Multiple Vitamins-Minerals (CENTRUM SILVER PO), Take 1 tablet by mouth every morning., Disp: , Rfl:    nitroGLYCERIN (NITRO-DUR) 0.4 mg/hr patch, Place 1 patch (0.4 mg total) onto the skin daily. Place on achilles tendon once a day-, Disp: 30 patch, Rfl: 12   nitroGLYCERIN (NITROSTAT) 0.4 MG SL tablet, Place 1 tablet (0.4 mg total) under the tongue every 5 (five) minutes as needed. (Patient taking differently: Place 0.4 mg under the tongue every 5 (five) minutes as needed for chest pain.), Disp: 30 tablet, Rfl: 3   pantoprazole (PROTONIX) 40 MG tablet, Take 1 tablet (40 mg total) by mouth 2 (two) times daily., Disp: 180 tablet, Rfl: 1   Potassium Chloride ER 20 MEQ TBCR, TAKE 1 TABLET BY MOUTH EVERY DAY (Patient taking differently: Take 1 tablet by mouth daily. TAKE 1 TABLET BY MOUTH EVERY DAY), Disp: 90 tablet, Rfl: 1   promethazine-dextromethorphan (PROMETHAZINE-DM) 6.25-15 MG/5ML syrup, TAKE 5 MLS BY MOUTH 2 (TWO) TIMES DAILY AS NEEDED FOR COUGH (DURING DAY)., Disp: 240 mL, Rfl: 0   rosuvastatin (CRESTOR) 20 MG tablet, Take 10 mg by mouth daily., Disp: , Rfl:    tiZANidine (ZANAFLEX) 2 MG tablet, Take 0.5-2 tablets (1-4 mg total) by mouth every 8 (eight) hours as needed for muscle spasms., Disp: 40 tablet, Rfl: 1   triamcinolone cream (KENALOG) 0.1 %, Apply 1 Application topically 2 (two) times daily as  needed (rash)., Disp: , Rfl:    valACYclovir (VALTREX) 500 MG tablet, Take 500 mg by mouth 2 (two) times daily., Disp: , Rfl: 0   Objective:     There were no vitals filed for this visit.    There is no height or weight on file to calculate BMI.    Physical Exam:    ***   Electronically signed by:  Heidi Franklin D.[facility] 4:29 PM 12/09/22

## 2022-12-10 ENCOUNTER — Ambulatory Visit (INDEPENDENT_AMBULATORY_CARE_PROVIDER_SITE_OTHER): Payer: Medicare Other | Admitting: Sports Medicine

## 2022-12-10 DIAGNOSIS — M1711 Unilateral primary osteoarthritis, right knee: Secondary | ICD-10-CM | POA: Diagnosis not present

## 2022-12-10 DIAGNOSIS — M25561 Pain in right knee: Secondary | ICD-10-CM

## 2022-12-10 DIAGNOSIS — G8929 Other chronic pain: Secondary | ICD-10-CM

## 2022-12-10 MED ORDER — HYALURONAN 88 MG/4ML IX SOSY
88.0000 mg | PREFILLED_SYRINGE | Freq: Once | INTRA_ARTICULAR | Status: AC
  Administered 2022-12-10: 88 mg via INTRA_ARTICULAR

## 2022-12-17 ENCOUNTER — Other Ambulatory Visit: Payer: Self-pay | Admitting: Cardiology

## 2022-12-17 ENCOUNTER — Telehealth: Payer: Self-pay | Admitting: Sports Medicine

## 2022-12-17 ENCOUNTER — Other Ambulatory Visit: Payer: Self-pay | Admitting: Family Medicine

## 2022-12-17 NOTE — Telephone Encounter (Signed)
Patient called stating that she was here for a visit on 12/10/22 and had an injection in her knee. At this point, she has not had any improvement. She asked if there is anything else she can try to help the pain?  Please advise.

## 2022-12-17 NOTE — Telephone Encounter (Signed)
Called and notified pt of Dr. Marisue Brooklyn recommendations. She vocalized understanding

## 2022-12-25 ENCOUNTER — Telehealth: Payer: Self-pay | Admitting: Family Medicine

## 2022-12-25 ENCOUNTER — Other Ambulatory Visit: Payer: Self-pay | Admitting: Family Medicine

## 2022-12-25 MED ORDER — METHYLPREDNISOLONE 4 MG PO TABS: ORAL_TABLET | ORAL | 0 refills | Status: DC

## 2022-12-25 NOTE — Telephone Encounter (Signed)
Patient states her chronic cough is acting up and she would like to know if she needs some prednisone. Please advice.

## 2022-12-26 NOTE — Telephone Encounter (Signed)
Called pt was notified

## 2022-12-26 NOTE — Telephone Encounter (Signed)
Requesting: alprazolam 0.'5mg'$   Contract: 06/11/22 UDS: 06/11/22 Last Visit: 07/04/22 Next Visit: None Last Refill: 06/05/22 #60 and 1RF   Please Advise

## 2022-12-31 DIAGNOSIS — L400 Psoriasis vulgaris: Secondary | ICD-10-CM | POA: Diagnosis not present

## 2023-01-07 NOTE — Progress Notes (Deleted)
[name] D.[place] Phone: [phone]   Assessment and Plan:     There are no diagnoses linked to this encounter.  ***   Pertinent previous records reviewed include ***   Follow Up: ***     Subjective:   I, Zafiro Routson, am serving as a Education administrator for Doctor Glennon Mac   Chief Complaint: left knee pain    HPI:    10/17/22 Patient is a 77 year old female complaining of left knee pain. Patient states that she fell a couple of days ago and now she I sore medial knee , she fell off her office chair onto wood , no numbness or tingling, just pain, pain radiates down to her calf, no meds for the pain    11/20/2022 Patient states right knee is still hurting , left is intermittent pain , keeps getting a cramp in her left ankle    12/10/2022 Patient states   01/14/2023 Patient states    Relevant Historical Information: Hypertension, CAD, GERD, DM type II  Additional pertinent review of systems negative.   Current Outpatient Medications:    methylPREDNISolone (MEDROL) 4 MG tablet, 5 tabs po x 1 day then 4 tabs po x 1 day then 3 tabs po x 1 day then 2 tabs po x 1 day then 1 tab po x 1 day and stop, Disp: 15 tablet, Rfl: 0   acetaminophen (TYLENOL) 325 MG tablet, Take 650 mg by mouth every 6 (six) hours as needed for mild pain or fever., Disp: , Rfl:    albuterol (VENTOLIN HFA) 108 (90 Base) MCG/ACT inhaler, INHALE 1-2 PUFFS INTO THE LUNGS EVERY 6 (SIX) HOURS AS NEEDED FOR WHEEZING OR SHORTNESS OF BREATH., Disp: 8.5 each, Rfl: 3   ALPRAZolam (XANAX) 0.5 MG tablet, TAKE 1 TABLET BY MOUTH TWICE A DAY AS NEEDED FOR ANXIETY, Disp: 60 tablet, Rfl: 1   amLODipine (NORVASC) 10 MG tablet, Take 1 tablet (10 mg total) by mouth daily., Disp: 30 tablet, Rfl: 2   aspirin 81 MG chewable tablet, Chew 81 mg by mouth daily., Disp: , Rfl:    betamethasone dipropionate (DIPROLENE) 0.05 % ointment, Apply 1  application  topically 2 (two) times daily., Disp: , Rfl:    Blood Pressure Monitoring (ADULT BLOOD PRESSURE CUFF LG) KIT, Check blood pressure as needed. (Patient taking differently: 1 each by Other route as needed (Check blood pressure). Check blood pressure as needed.), Disp: 1 kit, Rfl: 0   Cholecalciferol (VITAMIN D3) 50 MCG (2000 UT) capsule, Take 2,000 Units by mouth daily., Disp: , Rfl:    famotidine (PEPCID) 40 MG tablet, TAKE 1 TABLET BY MOUTH EVERY DAY (Patient taking differently: Take 40 mg by mouth daily.), Disp: 90 tablet, Rfl: 1   fluticasone (CUTIVATE) 0.05 % cream, Apply 1 Application topically 2 (two) times daily., Disp: , Rfl:    fluticasone (FLONASE) 50 MCG/ACT nasal spray, SPRAY 2 SPRAYS INTO EACH NOSTRIL EVERY DAY (Patient taking differently: Place 2 sprays into both nostrils daily.), Disp: 48 mL, Rfl: 2   glimepiride (AMARYL) 1 MG tablet, TAKE 1 TABLET BY MOUTH EVERY DAY WITH BREAKFAST, Disp: 90 tablet, Rfl: 1   glucose blood (ONE TOUCH ULTRA TEST) test strip, 1 each by Other route daily. Use 1 strips to check blood sugar twice a day Ex E11.9, Disp: 100 each, Rfl: 3   HUMIRA PEN-PS/UV/ADOL HS START 40 MG/0.8ML PNKT, Inject 1 Dose into the skin every  14 (fourteen) days. Every 2 weeks, Disp: , Rfl:    HYDROcodone bit-homatropine (HYCODAN) 5-1.5 MG/5ML syrup, Take 5 mLs by mouth at bedtime as needed for cough., Disp: 120 mL, Rfl: 0   HYDROcodone-acetaminophen (NORCO/VICODIN) 5-325 MG tablet, Take 1 tablet by mouth every 8 (eight) hours as needed. (Patient taking differently: Take 1 tablet by mouth every 8 (eight) hours as needed for moderate pain or severe pain.), Disp: 15 tablet, Rfl: 0   isosorbide mononitrate (IMDUR) 30 MG 24 hr tablet, Take 1 tablet (30 mg total) by mouth daily., Disp: 90 tablet, Rfl: 3   ketoconazole (NIZORAL) 2 % cream, Apply 1 Application topically daily as needed for irritation., Disp: , Rfl:    Lancets (ONETOUCH ULTRASOFT) lancets, PRN (Patient taking  differently: 1 each by Other route as needed for other (glucose check). PRN), Disp: 100 each, Rfl: 3   levocetirizine (XYZAL) 5 MG tablet, TAKE 1 TABLET BY MOUTH EVERY DAY IN THE EVENING (Patient taking differently: Take 5 mg by mouth every evening.), Disp: 90 tablet, Rfl: 1   metoprolol succinate (TOPROL-XL) 50 MG 24 hr tablet, Take 1 tablet (50 mg total) by mouth daily. Take with or immediately following a meal., Disp: 90 tablet, Rfl: 3   mometasone (ELOCON) 0.1 % ointment, Apply 1 application  topically daily., Disp: , Rfl:    montelukast (SINGULAIR) 10 MG tablet, TAKE 1 TABLET BY MOUTH EVERYDAY AT BEDTIME (Patient taking differently: Take 10 mg by mouth at bedtime.), Disp: 90 tablet, Rfl: 1   Multiple Vitamins-Minerals (CENTRUM SILVER PO), Take 1 tablet by mouth every morning., Disp: , Rfl:    nitroGLYCERIN (NITRO-DUR) 0.4 mg/hr patch, Place 1 patch (0.4 mg total) onto the skin daily. Place on achilles tendon once a day-, Disp: 30 patch, Rfl: 12   nitroGLYCERIN (NITROSTAT) 0.4 MG SL tablet, Place 1 tablet (0.4 mg total) under the tongue every 5 (five) minutes as needed. (Patient taking differently: Place 0.4 mg under the tongue every 5 (five) minutes as needed for chest pain.), Disp: 30 tablet, Rfl: 3   pantoprazole (PROTONIX) 40 MG tablet, Take 1 tablet (40 mg total) by mouth 2 (two) times daily., Disp: 180 tablet, Rfl: 1   Potassium Chloride ER 20 MEQ TBCR, TAKE 1 TABLET BY MOUTH EVERY DAY, Disp: 90 tablet, Rfl: 1   promethazine-dextromethorphan (PROMETHAZINE-DM) 6.25-15 MG/5ML syrup, TAKE 5 MLS BY MOUTH 2 (TWO) TIMES DAILY AS NEEDED FOR COUGH (DURING DAY)., Disp: 240 mL, Rfl: 0   rosuvastatin (CRESTOR) 20 MG tablet, Take 0.5 tablets (10 mg total) by mouth daily., Disp: 45 tablet, Rfl: 2   tiZANidine (ZANAFLEX) 2 MG tablet, Take 0.5-2 tablets (1-4 mg total) by mouth every 8 (eight) hours as needed for muscle spasms., Disp: 40 tablet, Rfl: 1   triamcinolone cream (KENALOG) 0.1 %, Apply 1  Application topically 2 (two) times daily as needed (rash)., Disp: , Rfl:    valACYclovir (VALTREX) 500 MG tablet, Take 500 mg by mouth 2 (two) times daily., Disp: , Rfl: 0   Objective:     There were no vitals filed for this visit.    There is no height or weight on file to calculate BMI.    Physical Exam:    ***   Electronically signed by:  [name] D.[facility] 9:05 AM 01/07/23

## 2023-01-14 ENCOUNTER — Ambulatory Visit: Payer: Medicare Other | Admitting: Sports Medicine

## 2023-01-14 DIAGNOSIS — H52223 Regular astigmatism, bilateral: Secondary | ICD-10-CM | POA: Diagnosis not present

## 2023-01-14 DIAGNOSIS — L400 Psoriasis vulgaris: Secondary | ICD-10-CM | POA: Diagnosis not present

## 2023-01-14 DIAGNOSIS — H524 Presbyopia: Secondary | ICD-10-CM | POA: Diagnosis not present

## 2023-01-15 ENCOUNTER — Encounter: Payer: Self-pay | Admitting: Family Medicine

## 2023-01-16 ENCOUNTER — Ambulatory Visit (INDEPENDENT_AMBULATORY_CARE_PROVIDER_SITE_OTHER): Payer: Medicare HMO | Admitting: Family Medicine

## 2023-01-16 VITALS — BP 112/56 | HR 89 | Temp 98.2°F | Ht 60.0 in | Wt 151.4 lb

## 2023-01-16 DIAGNOSIS — J0140 Acute pansinusitis, unspecified: Secondary | ICD-10-CM | POA: Diagnosis not present

## 2023-01-16 MED ORDER — PREDNISONE 20 MG PO TABS: 20.0000 mg | ORAL_TABLET | Freq: Every day | ORAL | 0 refills | Status: AC

## 2023-01-16 MED ORDER — AZITHROMYCIN 250 MG PO TABS: ORAL_TABLET | ORAL | 0 refills | Status: AC

## 2023-01-16 MED ORDER — HYDROCODONE BIT-HOMATROP MBR 5-1.5 MG/5ML PO SOLN: 5.0000 mL | Freq: Every evening | ORAL | 0 refills | Status: AC | PRN

## 2023-01-16 NOTE — Progress Notes (Signed)
Acute Office Visit  Subjective:     Patient ID: Heidi Franklin, female    DOB: [birth year], 77 y.o.   MRN: [id number]  CC: URI symptoms   HPI Patient is in today for URI symptoms.   Patient reports she started feeling bad over a week ago. Initial symptom was a sore throat (improving), followed by rhinorrhea, productive cough, postnasal drainage, right-sided maxillary/frontal sinus pressure. States this happens a few times per year and she usually responds well to Zpak, prednisone, and cough syrup. She denies fevers, chills, chest pain, dyspnea, ear pain. No known sick contacts.     ROS All review of systems negative except what is listed in the HPI      Objective:    BP (!) 112/56   Pulse 89   Temp 98.2 F (36.8 C)   Ht 5' (1.524 m)   Wt 151 lb 6.4 oz (68.7 kg)   SpO2 100%   BMI 29.57 kg/m    Physical Exam Vitals reviewed.  Constitutional:      Appearance: Normal appearance.  HENT:     Head: Normocephalic and atraumatic.     Nose: Congestion and rhinorrhea present.     Mouth/Throat:     Mouth: Mucous membranes are moist.     Pharynx: Oropharynx is clear. Posterior oropharyngeal erythema present.  Eyes:     Conjunctiva/sclera: Conjunctivae normal.  Cardiovascular:     Rate and Rhythm: Normal rate and regular rhythm.     Pulses: Normal pulses.     Heart sounds: Normal heart sounds.  Pulmonary:     Effort: Pulmonary effort is normal.     Breath sounds: Normal breath sounds.  Musculoskeletal:     Cervical back: Normal range of motion and neck supple. No tenderness.  Lymphadenopathy:     Cervical: No cervical adenopathy.  Skin:    General: Skin is warm and dry.  Neurological:     Mental Status: She is alert and oriented to person, place, and time.  Psychiatric:        Mood and Affect: Mood normal.        Behavior: Behavior normal.        Thought Content: Thought content normal.        Judgment: Judgment normal.     No results found for any visits on  01/16/23.      Assessment & Plan:   Problem List Items Addressed This Visit   None Visit Diagnoses     Acute non-recurrent pansinusitis    -  Primary Adding a Zpak and prednisone burst. Refilling your cough medicine - use sparingly. Continue Mucinex.  Continue supportive measures including rest, hydration, humidifier use, steam showers, warm compresses to sinuses, warm liquids with lemon and honey, and over-the-counter cough, cold, and analgesics as needed.      Relevant Medications   azithromycin (ZITHROMAX) 250 MG tablet   predniSONE (DELTASONE) 20 MG tablet   HYDROcodone bit-homatropine (HYCODAN) 5-1.5 MG/5ML syrup       Meds ordered this encounter  Medications   azithromycin (ZITHROMAX) 250 MG tablet    Sig: Take 2 tablets on day 1, then 1 tablet daily on days 2 through 5    Dispense:  6 tablet    Refill:  0    Order Specific Question:   Supervising Provider    Answer:   Penni Homans A [[id number]]   predniSONE (DELTASONE) 20 MG tablet    Sig: Take 1 tablet (20 mg total) by mouth  daily with breakfast for 5 days.    Dispense:  5 tablet    Refill:  0    Order Specific Question:   Supervising Provider    Answer:   Penni Homans A [[id number]]   HYDROcodone bit-homatropine (HYCODAN) 5-1.5 MG/5ML syrup    Sig: Take 5 mLs by mouth at bedtime as needed for up to 5 days for cough.    Dispense:  25 mL    Refill:  0    Order Specific Question:   Supervising Provider    Answer:   Penni Homans A [[id number]]    Return if symptoms worsen or fail to improve.  Terrilyn Saver, NP

## 2023-01-16 NOTE — Patient Instructions (Signed)
Adding a Zpak and prednisone burst. Refilling your cough medicine - use sparingly. Continue Mucinex.  Continue supportive measures including rest, hydration, humidifier use, steam showers, warm compresses to sinuses, warm liquids with lemon and honey, and over-the-counter cough, cold, and analgesics as needed.   Please contact office for follow-up if symptoms do not improve or worsen. Seek emergency care if symptoms become severe.

## 2023-01-21 NOTE — Progress Notes (Unsigned)
Heidi Franklin D.Heidi Franklin Phone: [phone]   Assessment and Plan:     There are no diagnoses linked to this encounter.  ***   Pertinent previous records reviewed include ***   Follow Up: ***     Subjective:   I, Heidi Franklin, am serving as a Education administrator for Doctor Heidi Franklin   Chief Complaint: left knee pain    HPI:    10/17/22 Patient is a 77 year old female complaining of left knee pain. Patient states that she fell a couple of days ago and now she I sore medial knee , she fell off her office chair onto wood , no numbness or tingling, just pain, pain radiates down to her calf, no meds for the pain    11/20/2022 Patient states right knee is still hurting , left is intermittent pain , keeps getting a cramp in her left ankle    12/10/2022 Patient states   01/22/2023 Patient states    Relevant Historical Information: Hypertension, CAD, GERD, DM type II  Additional pertinent review of systems negative.   Current Outpatient Medications:    acetaminophen (TYLENOL) 325 MG tablet, Take 650 mg by mouth every 6 (six) hours as needed for mild pain or fever., Disp: , Rfl:    albuterol (VENTOLIN HFA) 108 (90 Base) MCG/ACT inhaler, INHALE 1-2 PUFFS INTO THE LUNGS EVERY 6 (SIX) HOURS AS NEEDED FOR WHEEZING OR SHORTNESS OF BREATH., Disp: 8.5 each, Rfl: 3   ALPRAZolam (XANAX) 0.5 MG tablet, TAKE 1 TABLET BY MOUTH TWICE A DAY AS NEEDED FOR ANXIETY, Disp: 60 tablet, Rfl: 1   amLODipine (NORVASC) 10 MG tablet, Take 1 tablet (10 mg total) by mouth daily., Disp: 30 tablet, Rfl: 2   aspirin 81 MG chewable tablet, Chew 81 mg by mouth daily., Disp: , Rfl:    azithromycin (ZITHROMAX) 250 MG tablet, Take 2 tablets on day 1, then 1 tablet daily on days 2 through 5, Disp: 6 tablet, Rfl: 0   betamethasone dipropionate (DIPROLENE) 0.05 % ointment, Apply 1 application  topically 2 (two) times daily., Disp: , Rfl:     Blood Pressure Monitoring (ADULT BLOOD PRESSURE CUFF LG) KIT, Check blood pressure as needed. (Patient taking differently: 1 each by Other route as needed (Check blood pressure). Check blood pressure as needed.), Disp: 1 kit, Rfl: 0   Cholecalciferol (VITAMIN D3) 50 MCG (2000 UT) capsule, Take 2,000 Units by mouth daily., Disp: , Rfl:    famotidine (PEPCID) 40 MG tablet, TAKE 1 TABLET BY MOUTH EVERY DAY (Patient taking differently: Take 40 mg by mouth daily.), Disp: 90 tablet, Rfl: 1   fluticasone (CUTIVATE) 0.05 % cream, Apply 1 Application topically 2 (two) times daily., Disp: , Rfl:    fluticasone (FLONASE) 50 MCG/ACT nasal spray, SPRAY 2 SPRAYS INTO EACH NOSTRIL EVERY DAY (Patient taking differently: Place 2 sprays into both nostrils daily.), Disp: 48 mL, Rfl: 2   glimepiride (AMARYL) 1 MG tablet, TAKE 1 TABLET BY MOUTH EVERY DAY WITH BREAKFAST, Disp: 90 tablet, Rfl: 1   glucose blood (ONE TOUCH ULTRA TEST) test strip, 1 each by Other route daily. Use 1 strips to check blood sugar twice a day Ex E11.9, Disp: 100 each, Rfl: 3   HUMIRA PEN-PS/UV/ADOL HS START 40 MG/0.8ML PNKT, Inject 1 Dose into the skin every 14 (fourteen) days. Every 2 weeks, Disp: , Rfl:    HYDROcodone bit-homatropine (HYCODAN) 5-1.5 MG/5ML syrup, Take 5 mLs  by mouth at bedtime as needed for up to 5 days for cough., Disp: 25 mL, Rfl: 0   HYDROcodone-acetaminophen (NORCO/VICODIN) 5-325 MG tablet, Take 1 tablet by mouth every 8 (eight) hours as needed. (Patient taking differently: Take 1 tablet by mouth every 8 (eight) hours as needed for moderate pain or severe pain.), Disp: 15 tablet, Rfl: 0   isosorbide mononitrate (IMDUR) 30 MG 24 hr tablet, Take 1 tablet (30 mg total) by mouth daily., Disp: 90 tablet, Rfl: 3   ketoconazole (NIZORAL) 2 % cream, Apply 1 Application topically daily as needed for irritation., Disp: , Rfl:    Lancets (ONETOUCH ULTRASOFT) lancets, PRN (Patient taking differently: 1 each by Other route as needed for  other (glucose check). PRN), Disp: 100 each, Rfl: 3   levocetirizine (XYZAL) 5 MG tablet, TAKE 1 TABLET BY MOUTH EVERY DAY IN THE EVENING (Patient taking differently: Take 5 mg by mouth every evening.), Disp: 90 tablet, Rfl: 1   metoprolol succinate (TOPROL-XL) 50 MG 24 hr tablet, Take 1 tablet (50 mg total) by mouth daily. Take with or immediately following a meal., Disp: 90 tablet, Rfl: 3   mometasone (ELOCON) 0.1 % ointment, Apply 1 application  topically daily., Disp: , Rfl:    montelukast (SINGULAIR) 10 MG tablet, TAKE 1 TABLET BY MOUTH EVERYDAY AT BEDTIME (Patient taking differently: Take 10 mg by mouth at bedtime.), Disp: 90 tablet, Rfl: 1   Multiple Vitamins-Minerals (CENTRUM SILVER PO), Take 1 tablet by mouth every morning., Disp: , Rfl:    nitroGLYCERIN (NITRO-DUR) 0.4 mg/hr patch, Place 1 patch (0.4 mg total) onto the skin daily. Place on achilles tendon once a day-, Disp: 30 patch, Rfl: 12   nitroGLYCERIN (NITROSTAT) 0.4 MG SL tablet, Place 1 tablet (0.4 mg total) under the tongue every 5 (five) minutes as needed. (Patient taking differently: Place 0.4 mg under the tongue every 5 (five) minutes as needed for chest pain.), Disp: 30 tablet, Rfl: 3   pantoprazole (PROTONIX) 40 MG tablet, Take 1 tablet (40 mg total) by mouth 2 (two) times daily., Disp: 180 tablet, Rfl: 1   Potassium Chloride ER 20 MEQ TBCR, TAKE 1 TABLET BY MOUTH EVERY DAY, Disp: 90 tablet, Rfl: 1   predniSONE (DELTASONE) 20 MG tablet, Take 1 tablet (20 mg total) by mouth daily with breakfast for 5 days., Disp: 5 tablet, Rfl: 0   rosuvastatin (CRESTOR) 20 MG tablet, Take 0.5 tablets (10 mg total) by mouth daily., Disp: 45 tablet, Rfl: 2   triamcinolone cream (KENALOG) 0.1 %, Apply 1 Application topically 2 (two) times daily as needed (rash)., Disp: , Rfl:    valACYclovir (VALTREX) 500 MG tablet, Take 500 mg by mouth 2 (two) times daily., Disp: , Rfl: 0   Objective:     There were no vitals filed for this visit.    There is  no height or weight on file to calculate BMI.    Physical Exam:    ***   Electronically signed by:  Heidi Franklin D.[facility] 7:24 AM 01/21/23

## 2023-01-22 ENCOUNTER — Ambulatory Visit: Payer: Medicare HMO | Admitting: Sports Medicine

## 2023-01-22 VITALS — BP 124/62 | HR 84 | Ht 60.0 in | Wt 151.0 lb

## 2023-01-22 DIAGNOSIS — M25561 Pain in right knee: Secondary | ICD-10-CM | POA: Diagnosis not present

## 2023-01-22 DIAGNOSIS — M1711 Unilateral primary osteoarthritis, right knee: Secondary | ICD-10-CM

## 2023-01-22 DIAGNOSIS — G8929 Other chronic pain: Secondary | ICD-10-CM | POA: Diagnosis not present

## 2023-01-22 NOTE — Patient Instructions (Signed)
Tylenol [phone] mg 2-3 times a day for pain relief  Ibuprofen for breakthrough pain  Continue Voltaren gel topically  Continue HEP  If you want an injection call our clinic and ask for a prior authorization for zilretta to be ordered

## 2023-01-28 DIAGNOSIS — L400 Psoriasis vulgaris: Secondary | ICD-10-CM | POA: Diagnosis not present

## 2023-01-30 ENCOUNTER — Telehealth: Payer: Self-pay | Admitting: Sports Medicine

## 2023-01-30 ENCOUNTER — Other Ambulatory Visit: Payer: Self-pay | Admitting: Family Medicine

## 2023-01-30 NOTE — Telephone Encounter (Signed)
Patient called asking if Dr Glennon Mac thought that a knee brace would be beneficial for her? She said that her knee gave out yesterday.  Please advise.

## 2023-02-03 ENCOUNTER — Encounter: Payer: Self-pay | Admitting: Family Medicine

## 2023-02-03 DIAGNOSIS — R053 Chronic cough: Secondary | ICD-10-CM

## 2023-02-04 ENCOUNTER — Ambulatory Visit (HOSPITAL_BASED_OUTPATIENT_CLINIC_OR_DEPARTMENT_OTHER)
Admission: RE | Admit: 2023-02-04 | Discharge: 2023-02-04 | Disposition: A | Discharge status: Home / Self Care | Payer: Medicare HMO | Source: Ambulatory Visit | Attending: Family Medicine | Admitting: Family Medicine

## 2023-02-04 DIAGNOSIS — R053 Chronic cough: Secondary | ICD-10-CM | POA: Insufficient documentation

## 2023-02-04 DIAGNOSIS — R059 Cough, unspecified: Secondary | ICD-10-CM | POA: Diagnosis not present

## 2023-02-04 NOTE — Telephone Encounter (Signed)
Pt called to follow up on message status. Advised pt that Dr. Abner Greenspan had left notes regarding her message and advised that her CMA would be giving her a call regarding this info.

## 2023-02-04 NOTE — Telephone Encounter (Signed)
Called pt was advised of chest x-ray  And x-ray was ordered. Pt stated understand. Appt f/u made for 02/10/23

## 2023-02-05 ENCOUNTER — Other Ambulatory Visit: Payer: Self-pay | Admitting: Family Medicine

## 2023-02-05 MED ORDER — CEFDINIR 300 MG PO CAPS: 300.0000 mg | ORAL_CAPSULE | Freq: Two times a day (BID) | ORAL | 0 refills | Status: AC

## 2023-02-05 MED ORDER — METHYLPREDNISOLONE 4 MG PO TABS: ORAL_TABLET | ORAL | 0 refills | Status: DC

## 2023-02-09 NOTE — Assessment & Plan Note (Signed)
Supplement and monitor 

## 2023-02-09 NOTE — Assessment & Plan Note (Signed)
hgba1c acceptable, minimize simple carbs. Increase exercise as tolerated. Continue current meds 

## 2023-02-09 NOTE — Assessment & Plan Note (Signed)
Encourage heart healthy diet such as MIND or DASH diet, increase exercise, avoid trans fats, simple carbohydrates and processed foods, consider a krill or fish or flaxseed oil cap daily. Tolerating Rosuvastatin 

## 2023-02-09 NOTE — Assessment & Plan Note (Signed)
No recent exacerbation 

## 2023-02-09 NOTE — Assessment & Plan Note (Signed)
Encouraged DASH or MIND diet, decrease po intake and increase exercise as tolerated. Needs 7-8 hours of sleep nightly. Avoid trans fats, eat small, frequent meals every 4-5 hours with lean proteins, complex carbs and healthy fats. Minimize simple carbs, high fat foods and processed foods 

## 2023-02-09 NOTE — Assessment & Plan Note (Signed)
Well controlled, no changes to meds. Encouraged heart healthy diet such as the DASH diet and exercise as tolerated.  °

## 2023-02-10 ENCOUNTER — Other Ambulatory Visit: Payer: Self-pay

## 2023-02-10 ENCOUNTER — Ambulatory Visit (INDEPENDENT_AMBULATORY_CARE_PROVIDER_SITE_OTHER): Payer: Medicare HMO | Admitting: Family Medicine

## 2023-02-10 VITALS — BP 124/64 | HR 76 | Temp 97.5°F | Resp 16 | Ht 60.0 in | Wt 148.8 lb

## 2023-02-10 DIAGNOSIS — E1169 Type 2 diabetes mellitus with other specified complication: Secondary | ICD-10-CM

## 2023-02-10 DIAGNOSIS — E785 Hyperlipidemia, unspecified: Secondary | ICD-10-CM

## 2023-02-10 DIAGNOSIS — I10 Essential (primary) hypertension: Secondary | ICD-10-CM | POA: Diagnosis not present

## 2023-02-10 DIAGNOSIS — Z6831 Body mass index (BMI) 31.0-31.9, adult: Secondary | ICD-10-CM | POA: Diagnosis not present

## 2023-02-10 DIAGNOSIS — E559 Vitamin D deficiency, unspecified: Secondary | ICD-10-CM

## 2023-02-10 DIAGNOSIS — H269 Unspecified cataract: Secondary | ICD-10-CM | POA: Diagnosis not present

## 2023-02-10 DIAGNOSIS — J45909 Unspecified asthma, uncomplicated: Secondary | ICD-10-CM | POA: Diagnosis not present

## 2023-02-10 DIAGNOSIS — E1165 Type 2 diabetes mellitus with hyperglycemia: Secondary | ICD-10-CM

## 2023-02-10 LAB — COMPREHENSIVE METABOLIC PANEL
ALT: 19 U/L (ref 0–35)
AST: 16 U/L (ref 0–37)
Albumin: 3.8 g/dL (ref 3.5–5.2)
Alkaline Phosphatase: 53 U/L (ref 39–117)
BUN: 18 mg/dL (ref 6–23)
CO2: 29 mEq/L (ref 19–32)
Calcium: 9.1 mg/dL (ref 8.4–10.5)
Chloride: 105 mEq/L (ref 96–112)
Creatinine, Ser: 1.06 mg/dL (ref 0.40–1.20)
GFR: 50.53 mL/min — ABNORMAL LOW (ref >60.00)
Glucose, Bld: 85 mg/dL (ref 70–99)
Potassium: 3.4 mEq/L — ABNORMAL LOW (ref 3.5–5.1)
Sodium: 143 mEq/L (ref 135–145)
Total Bilirubin: 0.4 mg/dL (ref 0.2–1.2)
Total Protein: 6.8 g/dL (ref 6.0–8.3)

## 2023-02-10 LAB — MICROALBUMIN / CREATININE URINE RATIO
Creatinine,U: 141 mg/dL
Microalb Creat Ratio: 2.9 mg/g (ref 0.0–30.0)
Microalb, Ur: 4.1 mg/dL — ABNORMAL HIGH (ref 0.0–1.9)

## 2023-02-10 LAB — LIPID PANEL
Cholesterol: 160 mg/dL (ref 0–200)
HDL: 48.2 mg/dL (ref >39.00)
LDL Cholesterol: 85 mg/dL (ref 0–99)
NonHDL: 111.89
Total CHOL/HDL Ratio: 3
Triglycerides: 134 mg/dL (ref 0.0–149.0)
VLDL: 26.8 mg/dL (ref 0.0–40.0)

## 2023-02-10 LAB — CBC WITH DIFFERENTIAL/PLATELET
Basophils Absolute: 0 10*3/uL (ref 0.0–0.1)
Basophils Relative: 0.4 % (ref 0.0–3.0)
Eosinophils Absolute: 0.1 10*3/uL (ref 0.0–0.7)
Eosinophils Relative: 0.5 % (ref 0.0–5.0)
HCT: 38.6 % (ref 36.0–46.0)
Hemoglobin: 12.7 g/dL (ref 12.0–15.0)
Lymphocytes Relative: 46.7 % — ABNORMAL HIGH (ref 12.0–46.0)
Lymphs Abs: 5 10*3/uL — ABNORMAL HIGH (ref 0.7–4.0)
MCHC: 33 g/dL (ref 30.0–36.0)
MCV: 94.6 fl (ref 78.0–100.0)
Monocytes Absolute: 0.8 10*3/uL (ref 0.1–1.0)
Monocytes Relative: 7.1 % (ref 3.0–12.0)
Neutro Abs: 4.9 10*3/uL (ref 1.4–7.7)
Neutrophils Relative %: 45.3 % (ref 43.0–77.0)
Platelets: 247 10*3/uL (ref 150.0–400.0)
RBC: 4.08 Mil/uL (ref 3.87–5.11)
RDW: 14 % (ref 11.5–15.5)
WBC: 10.8 10*3/uL — ABNORMAL HIGH (ref 4.0–10.5)

## 2023-02-10 LAB — TSH: TSH: 4.5 u[IU]/mL (ref 0.35–5.50)

## 2023-02-10 LAB — VITAMIN D 25 HYDROXY (VIT D DEFICIENCY, FRACTURES): VITD: 65.96 ng/mL (ref 30.00–100.00)

## 2023-02-10 LAB — HEMOGLOBIN A1C: Hgb A1c MFr Bld: 6.7 % — ABNORMAL HIGH (ref 4.6–6.5)

## 2023-02-10 MED ORDER — HYDROCODONE BIT-HOMATROP MBR 5-1.5 MG/5ML PO SOLN: 5.0000 mL | Freq: Two times a day (BID) | ORAL | 0 refills | Status: DC | PRN

## 2023-02-10 MED ORDER — ENALAPRIL MALEATE 2.5 MG PO TABS: 2.5000 mg | ORAL_TABLET | Freq: Every day | ORAL | 3 refills | Status: DC

## 2023-02-10 NOTE — Patient Instructions (Signed)

## 2023-02-10 NOTE — Progress Notes (Signed)
Subjective:    Patient ID: Heidi Franklin, female    DOB: [birth year], 77 y.o.   MRN: [id number]  Chief Complaint  Patient presents with   Cough    Here for Cough    HPI Patient is in today for follow up on chronic medical concerns. No recent febrile illness or hospitalizations. Denies CP/palp/SOB/HA/congestion/fevers/GI or GU c/o. Taking meds as prescribed. She was struggling with persistent cough and bronchitis but after recent steroid and antibiotics she is feeling much better. Still coughing but not as much.   Past Medical History:  Diagnosis Date   ALLERGIC RHINITIS    Allergy    Anxiety state, unspecified    Arthritis    Asthma    BRONCHITIS, CHRONIC    Cataract    b/l   Chronic low back pain 10/09/2016   DEPRESSION    DIABETES MELLITUS, TYPE II    GERD    HYPERLIPIDEMIA    HYPERTENSION    Pneumonia    Psoriasis     Past Surgical History:  Procedure Laterality Date   ABDOMINAL HYSTERECTOMY  1980   CARPAL TUNNEL RELEASE Right    COLONOSCOPY     TRIGGER FINGER RELEASE Left    Index finger   UPPER GASTROINTESTINAL ENDOSCOPY      Family History  Problem Relation Age of Onset   Stroke Mother    Angina Mother    Hyperlipidemia Mother    Hypertension Mother    Heart disease Mother    Depression Mother    Diabetes Father    Hyperlipidemia Other        Parent   Hypertension Other        Parent   Diabetes Sister        x1   CAD Sister    COPD Sister        smoker   Hyperlipidemia Sister    Hypertension Sister    Diabetes Brother        x 2   Hyperlipidemia Brother    Hypertension Brother    Diabetes Brother    Hyperlipidemia Brother    Hypertension Brother    CAD Sister    Arthritis Sister    Hyperlipidemia Sister    Hypertension Sister    Colon cancer Neg Hx    Esophageal cancer Neg Hx    Rectal cancer Neg Hx    Stomach cancer Neg Hx    Pancreatic cancer Neg Hx     Social History   Socioeconomic History   Marital status: Married     Spouse name: Not on file   Number of children: 0   Years of education: 81yr colge   Highest education level: Some college, no degree  Occupational History   Occupation: Retired   Tobacco Use   Smoking status: Never    Passive exposure: Never   Smokeless tobacco: Never  Vaping Use   Vaping Use: Never used  Substance and Sexual Activity   Alcohol use: No    Alcohol/week: 0.0 standard drinks of alcohol   Drug use: No   Sexual activity: Not Currently  Other Topics Concern   Not on file  Social History Narrative   Married, lives with spouse-retired from [employer] insurance   Not employed    Drinks coffee occasional, Consumes 1 soda a day    No dietary restrictions   Social Determinants of Health   Financial Resource Strain: Medium Risk (01/16/2023)   Overall Financial Resource Strain (CARDIA)  Difficulty of Paying Living Expenses: Somewhat hard  Food Insecurity: Food Insecurity Present (01/16/2023)   Hunger Vital Sign    Worried About Running Out of Food in the Last Year: Never true    Ran Out of Food in the Last Year: Often true  Transportation Needs: No Transportation Needs (01/16/2023)   PRAPARE - Administrator, Civil Service (Medical): No    Lack of Transportation (Non-Medical): No  Physical Activity: Unknown (01/16/2023)   Exercise Vital Sign    Days of Exercise per Week: 0 days    Minutes of Exercise per Session: Not on file  Stress: Stress Concern Present (01/16/2023)   [employer] of Occupational Health - Occupational Stress Questionnaire    Feeling of Stress : Rather much  Social Connections: Socially Integrated (01/16/2023)   Social Connection and Isolation Panel [NHANES]    Frequency of Communication with Friends and Family: More than three times a week    Frequency of Social Gatherings with Friends and Family: Patient declined    Attends Religious Services: More than 4 times per year    Active Member of [employer] or Organizations: Yes    Attends Tax inspector Meetings: 1 to 4 times per year    Marital Status: Married  Catering manager Violence: Not At Risk (04/25/2021)   Humiliation, Afraid, Rape, and Kick questionnaire    Fear of Current or Ex-Partner: No    Emotionally Abused: No    Physically Abused: No    Sexually Abused: No    Outpatient Medications Prior to Visit  Medication Sig Dispense Refill   acetaminophen (TYLENOL) 325 MG tablet Take 650 mg by mouth every 6 (six) hours as needed for mild pain or fever.     albuterol (VENTOLIN HFA) 108 (90 Base) MCG/ACT inhaler INHALE 1-2 PUFFS INTO THE LUNGS EVERY 6 (SIX) HOURS AS NEEDED FOR WHEEZING OR SHORTNESS OF BREATH. 8.5 each 3   ALPRAZolam (XANAX) 0.5 MG tablet TAKE 1 TABLET BY MOUTH TWICE A DAY AS NEEDED FOR ANXIETY 60 tablet 1   amLODipine (NORVASC) 10 MG tablet Take 1 tablet (10 mg total) by mouth daily. 30 tablet 2   aspirin 81 MG chewable tablet Chew 81 mg by mouth daily.     betamethasone dipropionate (DIPROLENE) 0.05 % ointment Apply 1 application  topically 2 (two) times daily.     Blood Pressure Monitoring (ADULT BLOOD PRESSURE CUFF LG) KIT Check blood pressure as needed. (Patient taking differently: 1 each by Other route as needed (Check blood pressure). Check blood pressure as needed.) 1 kit 0   cefdinir (OMNICEF) 300 MG capsule Take 1 capsule (300 mg total) by mouth 2 (two) times daily for 7 days. 14 capsule 0   Cholecalciferol (VITAMIN D3) 50 MCG (2000 UT) capsule Take 2,000 Units by mouth daily.     famotidine (PEPCID) 40 MG tablet TAKE 1 TABLET BY MOUTH EVERY DAY 90 tablet 1   fluticasone (CUTIVATE) 0.05 % cream Apply 1 Application topically 2 (two) times daily.     fluticasone (FLONASE) 50 MCG/ACT nasal spray SPRAY 2 SPRAYS INTO EACH NOSTRIL EVERY DAY (Patient taking differently: Place 2 sprays into both nostrils daily.) 48 mL 2   glimepiride (AMARYL) 1 MG tablet TAKE 1 TABLET BY MOUTH EVERY DAY WITH BREAKFAST 90 tablet 1   glucose blood (ONE TOUCH ULTRA TEST) test  strip 1 each by Other route daily. Use 1 strips to check blood sugar twice a day Ex E11.9 100 each 3  HUMIRA PEN-PS/UV/ADOL HS START 40 MG/0.8ML PNKT Inject 1 Dose into the skin every 14 (fourteen) days. Every 2 weeks     HYDROcodone-acetaminophen (NORCO/VICODIN) 5-325 MG tablet Take 1 tablet by mouth every 8 (eight) hours as needed. (Patient taking differently: Take 1 tablet by mouth every 8 (eight) hours as needed for moderate pain or severe pain.) 15 tablet 0   isosorbide mononitrate (IMDUR) 30 MG 24 hr tablet Take 1 tablet (30 mg total) by mouth daily. 90 tablet 3   ketoconazole (NIZORAL) 2 % cream Apply 1 Application topically daily as needed for irritation.     Lancets (ONETOUCH ULTRASOFT) lancets PRN (Patient taking differently: 1 each by Other route as needed for other (glucose check). PRN) 100 each 3   levocetirizine (XYZAL) 5 MG tablet TAKE 1 TABLET BY MOUTH EVERY DAY IN THE EVENING (Patient taking differently: Take 5 mg by mouth every evening.) 90 tablet 1   methylPREDNISolone (MEDROL) 4 MG tablet 5 tabs po x 1 day then 4 tabs po x 1 day then 3 tabs po x 1 day then 2 tabs po x 1 day then 1 tab po x 1 day and stop 15 tablet 0   metoprolol succinate (TOPROL-XL) 50 MG 24 hr tablet Take 1 tablet (50 mg total) by mouth daily. Take with or immediately following a meal. 90 tablet 3   mometasone (ELOCON) 0.1 % ointment Apply 1 application  topically daily.     montelukast (SINGULAIR) 10 MG tablet TAKE 1 TABLET BY MOUTH EVERYDAY AT BEDTIME (Patient taking differently: Take 10 mg by mouth at bedtime.) 90 tablet 1   Multiple Vitamins-Minerals (CENTRUM SILVER PO) Take 1 tablet by mouth every morning.     nitroGLYCERIN (NITRO-DUR) 0.4 mg/hr patch Place 1 patch (0.4 mg total) onto the skin daily. Place on achilles tendon once a day- 30 patch 12   pantoprazole (PROTONIX) 40 MG tablet Take 1 tablet (40 mg total) by mouth 2 (two) times daily. 180 tablet 1   Potassium Chloride ER 20 MEQ TBCR TAKE 1 TABLET BY  MOUTH EVERY DAY 90 tablet 1   rosuvastatin (CRESTOR) 20 MG tablet Take 0.5 tablets (10 mg total) by mouth daily. 45 tablet 2   triamcinolone cream (KENALOG) 0.1 % Apply 1 Application topically 2 (two) times daily as needed (rash).     valACYclovir (VALTREX) 500 MG tablet Take 500 mg by mouth 2 (two) times daily.  0   nitroGLYCERIN (NITROSTAT) 0.4 MG SL tablet Place 1 tablet (0.4 mg total) under the tongue every 5 (five) minutes as needed. (Patient taking differently: Place 0.4 mg under the tongue every 5 (five) minutes as needed for chest pain.) 30 tablet 3   No facility-administered medications prior to visit.    Allergies  Allergen Reactions   Metformin And Related Diarrhea   Penicillins Rash    Review of Systems  Constitutional:  Negative for fever and malaise/fatigue.  HENT:  Negative for congestion.   Eyes:  Negative for blurred vision.  Respiratory:  Negative for shortness of breath.   Cardiovascular:  Negative for chest pain, palpitations and leg swelling.  Gastrointestinal:  Negative for abdominal pain, blood in stool and nausea.  Genitourinary:  Negative for dysuria and frequency.  Musculoskeletal:  Negative for falls.  Skin:  Negative for rash.  Neurological:  Negative for dizziness, loss of consciousness and headaches.  Endo/Heme/Allergies:  Negative for environmental allergies.  Psychiatric/Behavioral:  Negative for depression. The patient is not nervous/anxious.  Objective:    Physical Exam Constitutional:      General: She is not in acute distress.    Appearance: Normal appearance. She is well-developed. She is not toxic-appearing.  HENT:     Head: Normocephalic and atraumatic.     Right Ear: External ear normal.     Left Ear: External ear normal.     Nose: Nose normal.  Eyes:     General:        Right eye: No discharge.        Left eye: No discharge.     Conjunctiva/sclera: Conjunctivae normal.  Neck:     Thyroid: No thyromegaly.  Cardiovascular:      Rate and Rhythm: Normal rate and regular rhythm.     Heart sounds: Normal heart sounds. No murmur heard. Pulmonary:     Effort: Pulmonary effort is normal. No respiratory distress.     Breath sounds: Normal breath sounds.  Abdominal:     General: Bowel sounds are normal.     Palpations: Abdomen is soft.     Tenderness: There is no abdominal tenderness. There is no guarding.  Musculoskeletal:        General: Normal range of motion.     Cervical back: Neck supple.  Lymphadenopathy:     Cervical: No cervical adenopathy.  Skin:    General: Skin is warm and dry.  Neurological:     Mental Status: She is alert and oriented to person, place, and time.  Psychiatric:        Mood and Affect: Mood normal.        Behavior: Behavior normal.        Thought Content: Thought content normal.        Judgment: Judgment normal.   BP 124/64 (BP Location: Right Arm, Patient Position: Sitting, Cuff Size: Normal)   Pulse 76   Temp (!) 97.5 F (36.4 C) (Oral)   Resp 16   Ht 5' (1.524 m)   Wt 148 lb 12.8 oz (67.5 kg)   SpO2 97%   BMI 29.06 kg/m  Wt Readings from Last 3 Encounters:  02/10/23 148 lb 12.8 oz (67.5 kg)  01/22/23 151 lb (68.5 kg)  01/16/23 151 lb 6.4 oz (68.7 kg)    Diabetic Foot Exam - Simple   No data filed    Lab Results  Component Value Date   WBC 7.0 08/15/2022   HGB 11.4 (L) 08/15/2022   HCT 34.4 (L) 08/15/2022   PLT 282.0 08/15/2022   GLUCOSE 103 (H) 07/04/2022   CHOL 126 07/04/2022   TRIG 139.0 07/04/2022   HDL 36.70 (L) 07/04/2022   LDLDIRECT 155.1 02/09/2013   LDLCALC 61 07/04/2022   ALT 13 07/04/2022   AST 17 07/04/2022   NA 142 07/04/2022   K 4.1 07/04/2022   CL 108 07/04/2022   CREATININE 0.96 07/04/2022   BUN 15 07/04/2022   CO2 26 07/04/2022   TSH 3.51 07/04/2022   HGBA1C 6.7 (H) 03/26/2022   MICROALBUR 1.3 03/26/2022    Lab Results  Component Value Date   TSH 3.51 07/04/2022   Lab Results  Component Value Date   WBC 7.0 08/15/2022   HGB  11.4 (L) 08/15/2022   HCT 34.4 (L) 08/15/2022   MCV 93.4 08/15/2022   PLT 282.0 08/15/2022   Lab Results  Component Value Date   NA 142 07/04/2022   K 4.1 07/04/2022   CO2 26 07/04/2022   GLUCOSE 103 (H) 07/04/2022   BUN 15  07/04/2022   CREATININE 0.96 07/04/2022   BILITOT 0.3 07/04/2022   ALKPHOS 38 (L) 07/04/2022   AST 17 07/04/2022   ALT 13 07/04/2022   PROT 6.6 07/04/2022   ALBUMIN 3.6 07/04/2022   CALCIUM 9.2 07/04/2022   ANIONGAP 5 05/31/2022   EGFR 60 11/30/2021   GFR 57.15 (L) 07/04/2022   Lab Results  Component Value Date   CHOL 126 07/04/2022   Lab Results  Component Value Date   HDL 36.70 (L) 07/04/2022   Lab Results  Component Value Date   LDLCALC 61 07/04/2022   Lab Results  Component Value Date   TRIG 139.0 07/04/2022   Lab Results  Component Value Date   CHOLHDL 3 07/04/2022   Lab Results  Component Value Date   HGBA1C 6.7 (H) 03/26/2022       Assessment & Plan:  Uncomplicated asthma, unspecified asthma severity, unspecified whether persistent Assessment & Plan: No recent exacerbation   Body mass index (BMI) 31.0-31.9, adult Assessment & Plan: Encouraged DASH or MIND diet, decrease po intake and increase exercise as tolerated. Needs 7-8 hours of sleep nightly. Avoid trans fats, eat small, frequent meals every 4-5 hours with lean proteins, complex carbs and healthy fats. Minimize simple carbs, high fat foods and processed foods    Essential hypertension Assessment & Plan: Well controlled, no changes to meds. Encouraged heart healthy diet such as the DASH diet and exercise as tolerated.    Orders: -     CBC with Differential/Platelet -     Comprehensive metabolic panel -     TSH  Hyperlipidemia associated with type 2 diabetes mellitus Assessment & Plan: Encourage heart healthy diet such as MIND or DASH diet, increase exercise, avoid trans fats, simple carbohydrates and processed foods, consider a krill or fish or flaxseed oil cap  daily. Tolerating Rosuvastatin  Orders: -     Lipid panel  Type 2 diabetes mellitus with hyperglycemia, without long-term current use of insulin Assessment & Plan: hgba1c acceptable, minimize simple carbs. Increase exercise as tolerated. Continue current meds   Orders: -     Hemoglobin A1c -     Microalbumin / creatinine urine ratio  Vitamin D deficiency Assessment & Plan: Supplement and monitor   Orders: -     VITAMIN D 25 Hydroxy (Vit-D Deficiency, Fractures)  Cataract, unspecified cataract type, unspecified laterality Assessment & Plan: Had a bad flare this spring and is finally starting to feel better. Some cough persists but it is improved. Given a refill on Hydromet to use prn   Other orders -     HYDROcodone Bit-Homatrop MBr; Take 5 mLs by mouth 2 (two) times daily as needed for cough.  Dispense: 160 mL; Refill: 0    Danise Edge, MD

## 2023-02-10 NOTE — Assessment & Plan Note (Signed)
Had a bad flare this spring and is finally starting to feel better. Some cough persists but it is improved. Given a refill on Hydromet to use prn

## 2023-02-11 DIAGNOSIS — E119 Type 2 diabetes mellitus without complications: Secondary | ICD-10-CM | POA: Diagnosis not present

## 2023-02-11 DIAGNOSIS — L400 Psoriasis vulgaris: Secondary | ICD-10-CM | POA: Diagnosis not present

## 2023-02-13 ENCOUNTER — Telehealth: Payer: Self-pay | Admitting: Family Medicine

## 2023-02-13 NOTE — Progress Notes (Addendum)
Aleen Sells D.[name] [address] [place] [address] Phone: [phone]   Assessment and Plan:     1. Chronic pain of right knee 2. Primary osteoarthritis of right knee -Chronic with exacerbation, subsequent visit - Patient presents for procedure only visit for right knee Zilretta injection. - Patient received essentially no benefit from HA injection on 12/10/2022.  She received mild to moderate relief from CSI on 10/08/2022, however improvement only lasted for a few weeks - Continue Tylenol for day-to-day pain relief  Procedure: Knee Joint Injection Side: right Indication: flare of OA  Risks explained and consent was given verbally. The site was cleaned with alcohol prep. A needle was introduced with an anterio-lateral approach. Injection given using Zilretta 32 mg. This was well tolerated and resulted in symptomatic relief.  Needle was removed, hemostasis achieved, and post injection instructions were explained.   Pt was advised to call or return to clinic if these symptoms worsen or fail to improve as anticipated.   Other orders - Triamcinolone Acetonide (ZILRETTA) intra-articular injection 32 mg    Pertinent previous records reviewed include none   Follow Up: 3 to 4 weeks for reevaluation.  If no improvement or worsening of symptoms, could consider advanced imaging versus physical therapy versus referral to orthopedic surgery   Subjective:   I, Moenique Parris, am serving as a Neurosurgeon for Doctor Richardean Sale   Chief Complaint: left knee pain    HPI:    10/17/22 Patient is a 77 year old female complaining of left knee pain. Patient states that she fell a couple of days ago and now she I sore medial knee , she fell off her office chair onto wood , no numbness or tingling, just pain, pain radiates down to her calf, no meds for the pain    11/20/2022 Patient states right knee is still hurting , left is intermittent pain ,  keeps getting a cramp in her left ankle    12/10/2022 Patient states   01/22/2023 Patient states that right knee is still flared medial side    02/14/2023 Patient states   Relevant Historical Information: Hypertension, CAD, GERD, DM type II  Additional pertinent review of systems negative.   Current Outpatient Medications:    enalapril (VASOTEC) 2.5 MG tablet, Take 1 tablet (2.5 mg total) by mouth daily., Disp: 30 tablet, Rfl: 3   acetaminophen (TYLENOL) 325 MG tablet, Take 650 mg by mouth every 6 (six) hours as needed for mild pain or fever., Disp: , Rfl:    albuterol (VENTOLIN HFA) 108 (90 Base) MCG/ACT inhaler, INHALE 1-2 PUFFS INTO THE LUNGS EVERY 6 (SIX) HOURS AS NEEDED FOR WHEEZING OR SHORTNESS OF BREATH., Disp: 8.5 each, Rfl: 3   ALPRAZolam (XANAX) 0.5 MG tablet, TAKE 1 TABLET BY MOUTH TWICE A DAY AS NEEDED FOR ANXIETY, Disp: 60 tablet, Rfl: 1   amLODipine (NORVASC) 10 MG tablet, Take 1 tablet (10 mg total) by mouth daily., Disp: 30 tablet, Rfl: 2   aspirin 81 MG chewable tablet, Chew 81 mg by mouth daily., Disp: , Rfl:    betamethasone dipropionate (DIPROLENE) 0.05 % ointment, Apply 1 application  topically 2 (two) times daily., Disp: , Rfl:    Blood Pressure Monitoring (ADULT BLOOD PRESSURE CUFF LG) KIT, Check blood pressure as needed. (Patient taking differently: 1 each by Other route as needed (Check blood pressure). Check blood pressure as needed.), Disp: 1 kit, Rfl: 0   Cholecalciferol (VITAMIN D3) 50 MCG (2000  UT) capsule, Take 2,000 Units by mouth daily., Disp: , Rfl:    famotidine (PEPCID) 40 MG tablet, TAKE 1 TABLET BY MOUTH EVERY DAY, Disp: 90 tablet, Rfl: 1   fluticasone (CUTIVATE) 0.05 % cream, Apply 1 Application topically 2 (two) times daily., Disp: , Rfl:    fluticasone (FLONASE) 50 MCG/ACT nasal spray, SPRAY 2 SPRAYS INTO EACH NOSTRIL EVERY DAY (Patient taking differently: Place 2 sprays into both nostrils daily.), Disp: 48 mL, Rfl: 2   glimepiride (AMARYL) 1 MG  tablet, TAKE 1 TABLET BY MOUTH EVERY DAY WITH BREAKFAST, Disp: 90 tablet, Rfl: 1   glucose blood (ONE TOUCH ULTRA TEST) test strip, 1 each by Other route daily. Use 1 strips to check blood sugar twice a day Ex E11.9, Disp: 100 each, Rfl: 3   HUMIRA PEN-PS/UV/ADOL HS START 40 MG/0.8ML PNKT, Inject 1 Dose into the skin every 14 (fourteen) days. Every 2 weeks, Disp: , Rfl:    HYDROcodone bit-homatropine (HYDROMET) 5-1.5 MG/5ML syrup, Take 5 mLs by mouth 2 (two) times daily as needed for cough., Disp: 160 mL, Rfl: 0   HYDROcodone-acetaminophen (NORCO/VICODIN) 5-325 MG tablet, Take 1 tablet by mouth every 8 (eight) hours as needed. (Patient taking differently: Take 1 tablet by mouth every 8 (eight) hours as needed for moderate pain or severe pain.), Disp: 15 tablet, Rfl: 0   isosorbide mononitrate (IMDUR) 30 MG 24 hr tablet, Take 1 tablet (30 mg total) by mouth daily., Disp: 90 tablet, Rfl: 3   ketoconazole (NIZORAL) 2 % cream, Apply 1 Application topically daily as needed for irritation., Disp: , Rfl:    Lancets (ONETOUCH ULTRASOFT) lancets, PRN (Patient taking differently: 1 each by Other route as needed for other (glucose check). PRN), Disp: 100 each, Rfl: 3   levocetirizine (XYZAL) 5 MG tablet, TAKE 1 TABLET BY MOUTH EVERY DAY IN THE EVENING (Patient taking differently: Take 5 mg by mouth every evening.), Disp: 90 tablet, Rfl: 1   methylPREDNISolone (MEDROL) 4 MG tablet, 5 tabs po x 1 day then 4 tabs po x 1 day then 3 tabs po x 1 day then 2 tabs po x 1 day then 1 tab po x 1 day and stop, Disp: 15 tablet, Rfl: 0   metoprolol succinate (TOPROL-XL) 50 MG 24 hr tablet, Take 1 tablet (50 mg total) by mouth daily. Take with or immediately following a meal., Disp: 90 tablet, Rfl: 3   mometasone (ELOCON) 0.1 % ointment, Apply 1 application  topically daily., Disp: , Rfl:    montelukast (SINGULAIR) 10 MG tablet, TAKE 1 TABLET BY MOUTH EVERYDAY AT BEDTIME (Patient taking differently: Take 10 mg by mouth at  bedtime.), Disp: 90 tablet, Rfl: 1   Multiple Vitamins-Minerals (CENTRUM SILVER PO), Take 1 tablet by mouth every morning., Disp: , Rfl:    nitroGLYCERIN (NITRO-DUR) 0.4 mg/hr patch, Place 1 patch (0.4 mg total) onto the skin daily. Place on achilles tendon once a day-, Disp: 30 patch, Rfl: 12   nitroGLYCERIN (NITROSTAT) 0.4 MG SL tablet, Place 1 tablet (0.4 mg total) under the tongue every 5 (five) minutes as needed. (Patient taking differently: Place 0.4 mg under the tongue every 5 (five) minutes as needed for chest pain.), Disp: 30 tablet, Rfl: 3   pantoprazole (PROTONIX) 40 MG tablet, Take 1 tablet (40 mg total) by mouth 2 (two) times daily., Disp: 180 tablet, Rfl: 1   Potassium Chloride ER 20 MEQ TBCR, TAKE 1 TABLET BY MOUTH EVERY DAY, Disp: 90 tablet, Rfl: 1  rosuvastatin (CRESTOR) 20 MG tablet, Take 0.5 tablets (10 mg total) by mouth daily., Disp: 45 tablet, Rfl: 2   triamcinolone cream (KENALOG) 0.1 %, Apply 1 Application topically 2 (two) times daily as needed (rash)., Disp: , Rfl:    valACYclovir (VALTREX) 500 MG tablet, Take 500 mg by mouth 2 (two) times daily., Disp: , Rfl: 0      Electronically signed by:  Aleen Sells [name] 10:51 AM 02/14/23

## 2023-02-13 NOTE — Telephone Encounter (Signed)
Patient would like to get some advice regarding her bowel incontinence, she stated that within the last day it has gotten really bad. She stated she woke up and she was soiled everywhere, and then it happened again later that morning. Please advise.

## 2023-02-14 ENCOUNTER — Ambulatory Visit: Payer: Medicare HMO | Admitting: Sports Medicine

## 2023-02-14 DIAGNOSIS — M25561 Pain in right knee: Secondary | ICD-10-CM | POA: Diagnosis not present

## 2023-02-14 DIAGNOSIS — M1711 Unilateral primary osteoarthritis, right knee: Secondary | ICD-10-CM

## 2023-02-14 DIAGNOSIS — G8929 Other chronic pain: Secondary | ICD-10-CM | POA: Diagnosis not present

## 2023-02-14 MED ORDER — TRIAMCINOLONE ACETONIDE 32 MG IX SRER
32.0000 mg | Freq: Once | INTRA_ARTICULAR | Status: AC
  Administered 2023-02-14: 32 mg via INTRA_ARTICULAR

## 2023-02-14 NOTE — Telephone Encounter (Signed)
Called pt regarding bowel incontinence, Pt stated the stool wasn't loose, it was like regular bowel. Pt stated not had any falls or back pain, no diarrhea, constipation, or fevers but just regular bowel incontinence. Pt stated not sure and it just happen few times yesterday but not any more incontinence today.

## 2023-02-18 ENCOUNTER — Telehealth: Payer: Self-pay | Admitting: Family Medicine

## 2023-02-18 ENCOUNTER — Other Ambulatory Visit: Payer: Self-pay

## 2023-02-18 ENCOUNTER — Ambulatory Visit: Payer: Medicare Other | Admitting: Podiatry

## 2023-02-18 DIAGNOSIS — J45909 Unspecified asthma, uncomplicated: Secondary | ICD-10-CM

## 2023-02-18 MED ORDER — BUDESONIDE-FORMOTEROL FUMARATE 160-4.5 MCG/ACT IN AERO: 2.0000 | INHALATION_SPRAY | Freq: Two times a day (BID) | RESPIRATORY_TRACT | 1 refills | Status: DC

## 2023-02-18 NOTE — Telephone Encounter (Signed)
Called pt lvm to call our office back.

## 2023-02-18 NOTE — Telephone Encounter (Signed)
Called pt was advised of Symbicort 160/4.5 has been sent. Pt stated she does use albuterol sometimes but not everyday. She also stated  The bowel incontinence has been better and haven't had any more. Pt stated she ok with new referral pulmonology. Referral has been placed.

## 2023-02-18 NOTE — Telephone Encounter (Signed)
Pt said she isn't sure whether she has allergies or what but she has been coughing herself to death. Pt said nothing is taking is helping and she would like to know if there is something else she can take to help.

## 2023-02-19 ENCOUNTER — Other Ambulatory Visit (INDEPENDENT_AMBULATORY_CARE_PROVIDER_SITE_OTHER): Payer: Medicare HMO

## 2023-02-19 DIAGNOSIS — E876 Hypokalemia: Secondary | ICD-10-CM | POA: Diagnosis not present

## 2023-02-19 LAB — COMPREHENSIVE METABOLIC PANEL
ALT: 20 U/L (ref 0–35)
AST: 17 U/L (ref 0–37)
Albumin: 3.7 g/dL (ref 3.5–5.2)
Alkaline Phosphatase: 51 U/L (ref 39–117)
BUN: 19 mg/dL (ref 6–23)
CO2: 27 mEq/L (ref 19–32)
Calcium: 9.5 mg/dL (ref 8.4–10.5)
Chloride: 107 mEq/L (ref 96–112)
Creatinine, Ser: 1.01 mg/dL (ref 0.40–1.20)
GFR: 53.53 mL/min — ABNORMAL LOW (ref >60.00)
Glucose, Bld: 109 mg/dL — ABNORMAL HIGH (ref 70–99)
Potassium: 4.5 mEq/L (ref 3.5–5.1)
Sodium: 141 mEq/L (ref 135–145)
Total Bilirubin: 0.4 mg/dL (ref 0.2–1.2)
Total Protein: 6.6 g/dL (ref 6.0–8.3)

## 2023-02-20 ENCOUNTER — Ambulatory Visit: Payer: Medicare HMO | Admitting: Podiatry

## 2023-02-24 ENCOUNTER — Other Ambulatory Visit: Payer: Self-pay

## 2023-02-24 NOTE — Telephone Encounter (Signed)
Patient states Pulmonology wont be able to see her until May 9th, so she would like to know if there's anything else she can do for her cough until then. Please advise.

## 2023-02-24 NOTE — Telephone Encounter (Signed)
Pt was advise and to let us if worsen

## 2023-02-24 NOTE — Telephone Encounter (Signed)
Called pt regarding her Cough, we spoke last and was advised 01/2323  Medication had been sent. Pt don't pick up medication. Pt was advised she need to try it and  Follow up with pulmonology in a week at the appt.

## 2023-02-25 DIAGNOSIS — L400 Psoriasis vulgaris: Secondary | ICD-10-CM | POA: Diagnosis not present

## 2023-02-26 ENCOUNTER — Encounter: Payer: Self-pay | Admitting: Family Medicine

## 2023-03-03 ENCOUNTER — Other Ambulatory Visit (INDEPENDENT_AMBULATORY_CARE_PROVIDER_SITE_OTHER): Payer: Medicare HMO

## 2023-03-03 ENCOUNTER — Other Ambulatory Visit: Payer: Self-pay

## 2023-03-03 DIAGNOSIS — R059 Cough, unspecified: Secondary | ICD-10-CM | POA: Diagnosis not present

## 2023-03-03 NOTE — Telephone Encounter (Signed)
Called pt was advised of results and Pt has appt with pulmonary  On Thursday.Lab appt for TB gold made, Labs ordered

## 2023-03-05 LAB — QUANTIFERON-TB GOLD PLUS
Mitogen-NIL: 7.51 IU/mL
NIL: 0.03 IU/mL
QuantiFERON-TB Gold Plus: NEGATIVE
TB1-NIL: 0 IU/mL
TB2-NIL: 0 IU/mL

## 2023-03-06 ENCOUNTER — Ambulatory Visit: Payer: Medicare HMO | Admitting: Pulmonary Disease

## 2023-03-06 ENCOUNTER — Encounter: Payer: Self-pay | Admitting: Pulmonary Disease

## 2023-03-06 VITALS — BP 112/60 | HR 86 | Ht 60.0 in | Wt 149.2 lb

## 2023-03-06 DIAGNOSIS — K219 Gastro-esophageal reflux disease without esophagitis: Secondary | ICD-10-CM | POA: Diagnosis not present

## 2023-03-06 DIAGNOSIS — R053 Chronic cough: Secondary | ICD-10-CM

## 2023-03-06 MED ORDER — PREDNISONE 20 MG PO TABS: ORAL_TABLET | ORAL | 0 refills | Status: AC

## 2023-03-06 NOTE — Progress Notes (Signed)
@Patient  ID: Heidi Franklin, female    DOB: 1946-05-24, 77 y.o.   MRN: [id number]  Chief Complaint  Patient presents with   Follow-up    Cough(Clear) Breathing not the best  ACT 11    Referring provider: Bradd Canary, MD  HPI:   77 year old whom we are seeing in follow up for evaluation of chronic cough.  Most recent PCP note reviewed.    Overdue follow-up last seen in clinic by me about 2 years ago, by Rubye Oaks NP 18 months ago.  Note reviewed.  Cough essentially unchanged.  Again present for 12+ years.  No real therapy seem to have helped.  Prednisone seems to help occasionally although not consistently.  No improvement with inhalers.  She continues to have refractory reflux symptoms.  On near daily basis.  No burning, on PPI.  But endorses reflux symptoms, regurgitant sensation.  Constantly given duration of cough to be very difficult to improve cough.  In the past has responded well to neuropathic treatments but this caused side effects fall etc., not a good option moving forward.  Discussed biologic therapies possible cough variant asthma.  She is precontemplative about this.  HPI at initial visit:  Patient notes intermittent cough on and on since she was 77 years old.  Have been diagnosed with chronic bronchitis in the past.  Cough comes and goes and is persistent for up to 36-month stretches with interval improvement.  Cough is dry, nonproductive.  Worse at night or lying supine.  Has tried a number of oral medications as well as inhalers including albuterol and other ICS/LABA per her report.  Unclear how much the ICS/LABA helps.  Albuterol helps mildly.  Opiates cough suppressant seem to be the most helpful recently.  She reports intermittent courses of steroids that does help her cough.  No seasonal or environmental changes she can identify that make cough better or worse.  No better or worse when eats or drinks.  She endorses ongoing GERD symptoms despite daily PPI.   Both burn as well as pressure or reflux symptoms.  This is nearly daily.  She states her "stomach does not empty" and she is worried this is the reason why she has reflux.  She keeps a very hoarse voice.  Reviewed most recent chest imaging chest x-ray 01/29/2021, interpretation shows clear lungs.  Reviewed CT scan 04/2017 and on my review and interpretation demonstrates bilateral mild lower lobe bronchiectasis.  Per review of pulmonary notes, she had been on Neurontin as well as Elavil in the past but had some falls.  Due to concern for medication or polypharmacy contributing, these medications were discontinued.  Seems like these medicines helped mildly per notes, not hugely successful.  PMH: Hypertension, diabetes, hyperlipidemia, seasonal allergies Surgical history: Hysterectomy Family history: CAD in first-degree relatives noted, denies respiratory illnesses in first-degree relatives Social history: Never smoker, lives in [place]  Questionaires / Pulmonary Flowsheets:   ACT:  Asthma Control Test ACT Total Score  03/06/2023  9:19 AM 11    MMRC:     No data to display          Epworth:      No data to display          Tests:   FENO:  Lab Results  Component Value Date   NITRICOXIDE 15 04/25/2017    PFT:    Latest Ref Rng & Units 12/24/2013    9:46 AM  PFT Results  FVC-Pre L 2.14  FVC-Predicted Pre % 100   FVC-Post L 2.11   FVC-Predicted Post % 99   Pre FEV1/FVC % % 81   Post FEV1/FCV % % 80   FEV1-Pre L 1.72   FEV1-Predicted Pre % 104   FEV1-Post L 1.69   Personally reviewed, spirometry normal  WALK:      No data to display          Imaging: Personally reviewed and as per EMR discussion this note  Lab Results: Personally reviewed, eosinophils as high as 300 in the past CBC    Component Value Date/Time   WBC 10.8 (H) 02/10/2023 1106   RBC 4.08 02/10/2023 1106   HGB 12.7 02/10/2023 1106   HCT 38.6 02/10/2023 1106   PLT 247.0 02/10/2023 1106    MCV 94.6 02/10/2023 1106   MCH 31.5 05/31/2022 1522   MCHC 33.0 02/10/2023 1106   RDW 14.0 02/10/2023 1106   LYMPHSABS 5.0 (H) 02/10/2023 1106   MONOABS 0.8 02/10/2023 1106   EOSABS 0.1 02/10/2023 1106   BASOSABS 0.0 02/10/2023 1106    BMET    Component Value Date/Time   NA 141 02/19/2023 0824   NA 141 11/30/2021 1054   K 4.5 02/19/2023 0824   CL 107 02/19/2023 0824   CO2 27 02/19/2023 0824   GLUCOSE 109 (H) 02/19/2023 0824   BUN 19 02/19/2023 0824   BUN 12 11/30/2021 1054   CREATININE 1.01 02/19/2023 0824   CREATININE 1.05 (H) 06/09/2020 1519   CALCIUM 9.5 02/19/2023 0824   GFRNONAA >60 05/31/2022 1522   GFRAA >60 10/14/2015 1225    BNP No results found for: "BNP"  ProBNP No results found for: "PROBNP"  Specialty Problems       Pulmonary Problems   Chronic rhinitis    Followed in Pulmonary clinic/ [facility]/ [place]  CT sinus 11/08/11>>Negative paranasal sinuses.  Rightward nasal septal deviation and possible rhinitis. 01/13/12>>RAST negative, IgE 22.7       Upper airway cough syndrome    Followed in Pulmonary clinic/ [facility]/ [place]  CT sinus 11/08/11>>Negative paranasal sinuses.  Rightward nasal septal deviation and possible rhinitis. 01/13/12>>  RAST negative, IgE 22.7 PFT 11/07/11>>FEV1 1.79 (102%),  Ratio 82, TLC 4.18 (104%), DLCO 69%, positive BD only in terms of fef 25/75 -med calendar 11/23/2012 , 03/18/2013 , 08/26/2013 , 12/06/2013, 06/21/2014 > not keeping up with it ,redone  06/11/2016  -methacholine challenge test  12/24/13 > neg  - try off singulair 12/31/2013 > no worse  - added neurontin 100 mg qid  05/20/14 but did not take consistently as of 06/30/2014 > increased to 100 qid 08/23/2014 improved then stopped it and flared spirng of 2016  - 07/05/2015 increase neurontin 300 tid and added elavil 10 mg at hs > not clear she did this at f/u ov 08/03/2015  - added flutter valve 08/03/2015  - neurontin/ultram /elavil stopped 07/2015 due to falls > no  worse 12/04/2015           Multiple pulmonary nodules    CT Renal  10/14/16  x 3 nodules all < 6 mm       Cough    Has struggled with recurrent bronchitis since the age of 10 and has been evaluated and seen recurrently by pulmonary and allergist.       Reflux laryngitis   Irritable larynx syndrome   Bronchiectasis without complication (HCC)    See HRCT  05/01/17 with very mild bilateral cylindrical bronchiectasis in the lower lobes bilaterally.  Asthma    Allergies  Allergen Reactions   Metformin And Related Diarrhea   Penicillins Rash    Immunization History  Administered Date(s) Administered   Fluad Quad(high Dose 65+) 06/25/2019, 07/14/2020, 07/17/2021   Influenza Split 08/20/2011, 07/30/2012, 06/28/2014   Influenza Whole 07/28/2009, 07/06/2010   Influenza, High Dose Seasonal PF 06/19/2018, 06/18/2022   Influenza, Seasonal, Injecte, Preservative Fre 06/25/2019   Influenza,inj,Quad PF,6+ Mos 06/23/2013, 07/05/2015, 06/20/2016, 06/23/2017   Influenza-Unspecified 06/28/2014   Moderna Covid-19 Vaccine Bivalent Booster 16yrs & up 09/07/2021   Moderna Sars-Covid-2 Vaccination 11/27/2019, 12/27/2019, 09/12/2020   Pneumococcal Conjugate-13 06/15/2015   Pneumococcal Polysaccharide-23 10/02/2011   Td 11/23/2013   Zoster Recombinat (Shingrix) 01/22/2022, 06/18/2022    Past Medical History:  Diagnosis Date   ALLERGIC RHINITIS    Allergy    Anxiety state, unspecified    Arthritis    Asthma    BRONCHITIS, CHRONIC    Cataract    b/l   Chronic low back pain 10/09/2016   DEPRESSION    DIABETES MELLITUS, TYPE II    GERD    HYPERLIPIDEMIA    HYPERTENSION    Pneumonia    Psoriasis     Tobacco History: Social History   Tobacco Use  Smoking Status Never   Passive exposure: Never  Smokeless Tobacco Never   Counseling given: Not Answered   Continue to not smoke  Outpatient Encounter Medications as of 03/06/2023  Medication Sig   acetaminophen (TYLENOL) 325 MG  tablet Take 650 mg by mouth every 6 (six) hours as needed for mild pain or fever.   albuterol (VENTOLIN HFA) 108 (90 Base) MCG/ACT inhaler INHALE 1-2 PUFFS INTO THE LUNGS EVERY 6 (SIX) HOURS AS NEEDED FOR WHEEZING OR SHORTNESS OF BREATH.   ALPRAZolam (XANAX) 0.5 MG tablet TAKE 1 TABLET BY MOUTH TWICE A DAY AS NEEDED FOR ANXIETY   amLODipine (NORVASC) 10 MG tablet Take 1 tablet (10 mg total) by mouth daily.   aspirin 81 MG chewable tablet Chew 81 mg by mouth daily.   betamethasone dipropionate (DIPROLENE) 0.05 % ointment Apply 1 application  topically 2 (two) times daily.   Blood Pressure Monitoring (ADULT BLOOD PRESSURE CUFF LG) KIT Check blood pressure as needed. (Patient taking differently: 1 each by Other route as needed (Check blood pressure). Check blood pressure as needed.)   budesonide-formoterol (SYMBICORT) 160-4.5 MCG/ACT inhaler Inhale 2 puffs into the lungs 2 (two) times daily.   Cholecalciferol (VITAMIN D3) 50 MCG (2000 UT) capsule Take 2,000 Units by mouth daily.   enalapril (VASOTEC) 2.5 MG tablet Take 1 tablet (2.5 mg total) by mouth daily.   famotidine (PEPCID) 40 MG tablet TAKE 1 TABLET BY MOUTH EVERY DAY   fluticasone (CUTIVATE) 0.05 % cream Apply 1 Application topically 2 (two) times daily.   fluticasone (FLONASE) 50 MCG/ACT nasal spray SPRAY 2 SPRAYS INTO EACH NOSTRIL EVERY DAY (Patient taking differently: Place 2 sprays into both nostrils daily.)   glimepiride (AMARYL) 1 MG tablet TAKE 1 TABLET BY MOUTH EVERY DAY WITH BREAKFAST   glucose blood (ONE TOUCH ULTRA TEST) test strip 1 each by Other route daily. Use 1 strips to check blood sugar twice a day Ex E11.9   HUMIRA PEN-PS/UV/ADOL HS START 40 MG/0.8ML PNKT Inject 1 Dose into the skin every 14 (fourteen) days. Every 2 weeks   HYDROcodone bit-homatropine (HYDROMET) 5-1.5 MG/5ML syrup Take 5 mLs by mouth 2 (two) times daily as needed for cough.   HYDROcodone-acetaminophen (NORCO/VICODIN) 5-325 MG tablet Take 1 tablet by  mouth  every 8 (eight) hours as needed. (Patient taking differently: Take 1 tablet by mouth every 8 (eight) hours as needed for moderate pain or severe pain.)   isosorbide mononitrate (IMDUR) 30 MG 24 hr tablet Take 1 tablet (30 mg total) by mouth daily.   ketoconazole (NIZORAL) 2 % cream Apply 1 Application topically daily as needed for irritation.   Lancets (ONETOUCH ULTRASOFT) lancets PRN (Patient taking differently: 1 each by Other route as needed for other (glucose check). PRN)   levocetirizine (XYZAL) 5 MG tablet TAKE 1 TABLET BY MOUTH EVERY DAY IN THE EVENING (Patient taking differently: Take 5 mg by mouth every evening.)   methylPREDNISolone (MEDROL) 4 MG tablet 5 tabs po x 1 day then 4 tabs po x 1 day then 3 tabs po x 1 day then 2 tabs po x 1 day then 1 tab po x 1 day and stop   metoprolol succinate (TOPROL-XL) 50 MG 24 hr tablet Take 1 tablet (50 mg total) by mouth daily. Take with or immediately following a meal.   mometasone (ELOCON) 0.1 % ointment Apply 1 application  topically daily.   montelukast (SINGULAIR) 10 MG tablet TAKE 1 TABLET BY MOUTH EVERYDAY AT BEDTIME (Patient taking differently: Take 10 mg by mouth at bedtime.)   Multiple Vitamins-Minerals (CENTRUM SILVER PO) Take 1 tablet by mouth every morning.   nitroGLYCERIN (NITRO-DUR) 0.4 mg/hr patch Place 1 patch (0.4 mg total) onto the skin daily. Place on achilles tendon once a day-   pantoprazole (PROTONIX) 40 MG tablet Take 1 tablet (40 mg total) by mouth 2 (two) times daily.   Potassium Chloride ER 20 MEQ TBCR TAKE 1 TABLET BY MOUTH EVERY DAY   rosuvastatin (CRESTOR) 20 MG tablet Take 0.5 tablets (10 mg total) by mouth daily.   triamcinolone cream (KENALOG) 0.1 % Apply 1 Application topically 2 (two) times daily as needed (rash).   valACYclovir (VALTREX) 500 MG tablet Take 500 mg by mouth 2 (two) times daily.   nitroGLYCERIN (NITROSTAT) 0.4 MG SL tablet Place 1 tablet (0.4 mg total) under the tongue every 5 (five) minutes as needed.  (Patient taking differently: Place 0.4 mg under the tongue every 5 (five) minutes as needed for chest pain.)   No facility-administered encounter medications on file as of 03/06/2023.     Review of Systems  Review of Systems  No chest pain with exertion.  No orthopnea or PND.  No lower extremity swelling.  Comprehensive review of systems otherwise negative. Physical Exam  BP 112/60 (BP Location: Left Arm, Patient Position: Sitting, Cuff Size: Normal)   Pulse 86   Ht 5' (1.524 m)   Wt 149 lb 3.2 oz (67.7 kg)   SpO2 98%   BMI 29.14 kg/m   Wt Readings from Last 5 Encounters:  03/06/23 149 lb 3.2 oz (67.7 kg)  02/10/23 148 lb 12.8 oz (67.5 kg)  01/22/23 151 lb (68.5 kg)  01/16/23 151 lb 6.4 oz (68.7 kg)  12/05/22 155 lb (70.3 kg)    BMI Readings from Last 5 Encounters:  03/06/23 29.14 kg/m  02/10/23 29.06 kg/m  01/22/23 29.49 kg/m  01/16/23 29.57 kg/m  12/05/22 30.27 kg/m     Physical Exam General: Well-appearing, no distress Eyes: EOMI, no icterus Neck: Supple no JVP Cardiovascular: Regular rate and rhythm, no murmur Pulmonary: Clear to oscillation bilaterally, good air movement, no wheeze, normal work of breathing Abdomen: Nondistended, bowel sounds present MSK: No synovitis, joint effusion Neuro: Normal gait, no weakness, hoarse voice  Psych: Normal mood, full affect   Assessment & Plan:   Chronic cough: Suspect multifactorial however most likely her largest contributor suspected to be reflux.  She has breakthrough reflux and heartburn symptoms despite daily PPI.  She has hoarse voice.  Bilateral lower lobe bronchiectasis likely sequela of chronic silent aspiration.  She has mechanical reason for lack of gastric emptying.  Concern for possible contribution of asthma given her longstanding history of atopic symptoms as well as "chronic bronchitis" as a child.  Seems prednisone helped from time to time.  However, she had a negative methacholine challenge in the past,  however this is not very specific and not a good test for cough variant asthma.  Prior chest imaging clear.  Responded well to neuropathic treatment in the past but with side effects intolerable not a good option moving forward.  Repeat prednisone taper today.  Referral to GI for further evaluation of reflux.  Consider Biologics in the future.    Return in about 3 months (around 06/06/2023).   Karren Burly, MD 03/06/2023

## 2023-03-06 NOTE — Patient Instructions (Signed)
Nice to see you again  Continue the Symbicort 2 puffs twice a day, continue albuterol as needed  I think is worth trying this different inhaler Breztri 2 puffs twice a day every day.  While using the [facility], please stop the Symbicort.  If it helps let me know and I can prescribe it, if not just return to using the Symbicort  I sent a prescription for prednisone and see if this can continue to help with cough  I think you should consider using biologic treatment for possible asthma related cough.  These are injections given usually every 4 weeks or so.  I do think the main reason for your cough is reflux.  I cannot fix this.  I sent a message with a referral to the GI doctors to reevaluate other treatment for reflux more than just the pill.  Return to clinic in 3 months or sooner as needed with Dr. Judeth Horn:

## 2023-03-06 NOTE — Progress Notes (Signed)
Heidi Franklin Heidi.[facility] [address] [place] [address] Phone: [phone]   Assessment and Plan:     1. Chronic pain of right knee 2. Primary osteoarthritis of right knee  -Chronic with exacerbation, subsequent visit - Patient has received moderate to significant relief after Zilretta injection performed on 02/14/2023.  Discussed that we hope to get at least 3 months relief and if we do, could repeat Zilretta injections every 3 months as needed - May continue Tylenol and Voltaren gel for day-to-day pain relief - Continue HEP  Pertinent previous records reviewed include none   Follow Up: As needed   Subjective:   I, Heidi Franklin, am serving as a Neurosurgeon for Doctor Heidi Franklin   Chief Complaint: left knee pain    HPI:    10/17/22 Patient is a 77 year old female complaining of left knee pain. Patient states that she fell a couple of days ago and now she I sore medial knee , she fell off her office chair onto wood , no numbness or tingling, just pain, pain radiates down to her calf, no meds for the pain    11/20/2022 Patient states right knee is still hurting , left is intermittent pain , keeps getting a cramp in her left ankle    12/10/2022 Patient states   01/22/2023 Patient states that right knee is still flared medial side    02/14/2023 Patient states  03/07/2023 Patient states she is better      Relevant Historical Information: Hypertension, CAD, GERD, DM type II  Additional pertinent review of systems negative.   Current Outpatient Medications:    acetaminophen (TYLENOL) 325 MG tablet, Take 650 mg by mouth every 6 (six) hours as needed for mild pain or fever., Disp: , Rfl:    albuterol (VENTOLIN HFA) 108 (90 Base) MCG/ACT inhaler, INHALE 1-2 PUFFS INTO THE LUNGS EVERY 6 (SIX) HOURS AS NEEDED FOR WHEEZING OR SHORTNESS OF BREATH., Disp: 8.5 each, Rfl: 3   ALPRAZolam (XANAX) 0.5 MG tablet, TAKE 1 TABLET BY MOUTH  TWICE A DAY AS NEEDED FOR ANXIETY, Disp: 60 tablet, Rfl: 1   amLODipine (NORVASC) 10 MG tablet, Take 1 tablet (10 mg total) by mouth daily., Disp: 30 tablet, Rfl: 2   aspirin 81 MG chewable tablet, Chew 81 mg by mouth daily., Disp: , Rfl:    betamethasone dipropionate (DIPROLENE) 0.05 % ointment, Apply 1 application  topically 2 (two) times daily., Disp: , Rfl:    Blood Pressure Monitoring (ADULT BLOOD PRESSURE CUFF LG) KIT, Check blood pressure as needed. (Patient taking differently: 1 each by Other route as needed (Check blood pressure). Check blood pressure as needed.), Disp: 1 kit, Rfl: 0   budesonide-formoterol (SYMBICORT) 160-4.5 MCG/ACT inhaler, Inhale 2 puffs into the lungs 2 (two) times daily., Disp: 1 each, Rfl: 1   Cholecalciferol (VITAMIN D3) 50 MCG (2000 UT) capsule, Take 2,000 Units by mouth daily., Disp: , Rfl:    enalapril (VASOTEC) 2.5 MG tablet, Take 1 tablet (2.5 mg total) by mouth daily., Disp: 30 tablet, Rfl: 3   famotidine (PEPCID) 40 MG tablet, TAKE 1 TABLET BY MOUTH EVERY DAY, Disp: 90 tablet, Rfl: 1   fluticasone (CUTIVATE) 0.05 % cream, Apply 1 Application topically 2 (two) times daily., Disp: , Rfl:    fluticasone (FLONASE) 50 MCG/ACT nasal spray, SPRAY 2 SPRAYS INTO EACH NOSTRIL EVERY DAY (Patient taking differently: Place 2 sprays into both nostrils daily.), Disp: 48 mL, Rfl: 2  glimepiride (AMARYL) 1 MG tablet, TAKE 1 TABLET BY MOUTH EVERY DAY WITH BREAKFAST, Disp: 90 tablet, Rfl: 1   glucose blood (ONE TOUCH ULTRA TEST) test strip, 1 each by Other route daily. Use 1 strips to check blood sugar twice a day Ex E11.9, Disp: 100 each, Rfl: 3   HUMIRA PEN-PS/UV/ADOL HS START 40 MG/0.8ML PNKT, Inject 1 Dose into the skin every 14 (fourteen) days. Every 2 weeks, Disp: , Rfl:    HYDROcodone bit-homatropine (HYDROMET) 5-1.5 MG/5ML syrup, Take 5 mLs by mouth 2 (two) times daily as needed for cough., Disp: 160 mL, Rfl: 0   HYDROcodone-acetaminophen (NORCO/VICODIN) 5-325 MG tablet,  Take 1 tablet by mouth every 8 (eight) hours as needed. (Patient taking differently: Take 1 tablet by mouth every 8 (eight) hours as needed for moderate pain or severe pain.), Disp: 15 tablet, Rfl: 0   isosorbide mononitrate (IMDUR) 30 MG 24 hr tablet, Take 1 tablet (30 mg total) by mouth daily., Disp: 90 tablet, Rfl: 3   ketoconazole (NIZORAL) 2 % cream, Apply 1 Application topically daily as needed for irritation., Disp: , Rfl:    Lancets (ONETOUCH ULTRASOFT) lancets, PRN (Patient taking differently: 1 each by Other route as needed for other (glucose check). PRN), Disp: 100 each, Rfl: 3   levocetirizine (XYZAL) 5 MG tablet, TAKE 1 TABLET BY MOUTH EVERY DAY IN THE EVENING (Patient taking differently: Take 5 mg by mouth every evening.), Disp: 90 tablet, Rfl: 1   methylPREDNISolone (MEDROL) 4 MG tablet, 5 tabs po x 1 day then 4 tabs po x 1 day then 3 tabs po x 1 day then 2 tabs po x 1 day then 1 tab po x 1 day and stop, Disp: 15 tablet, Rfl: 0   metoprolol succinate (TOPROL-XL) 50 MG 24 hr tablet, Take 1 tablet (50 mg total) by mouth daily. Take with or immediately following a meal., Disp: 90 tablet, Rfl: 3   mometasone (ELOCON) 0.1 % ointment, Apply 1 application  topically daily., Disp: , Rfl:    montelukast (SINGULAIR) 10 MG tablet, TAKE 1 TABLET BY MOUTH EVERYDAY AT BEDTIME (Patient taking differently: Take 10 mg by mouth at bedtime.), Disp: 90 tablet, Rfl: 1   Multiple Vitamins-Minerals (CENTRUM SILVER PO), Take 1 tablet by mouth every morning., Disp: , Rfl:    nitroGLYCERIN (NITRO-DUR) 0.4 mg/hr patch, Place 1 patch (0.4 mg total) onto the skin daily. Place on achilles tendon once a day-, Disp: 30 patch, Rfl: 12   pantoprazole (PROTONIX) 40 MG tablet, Take 1 tablet (40 mg total) by mouth 2 (two) times daily., Disp: 180 tablet, Rfl: 1   Potassium Chloride ER 20 MEQ TBCR, TAKE 1 TABLET BY MOUTH EVERY DAY, Disp: 90 tablet, Rfl: 1   predniSONE (DELTASONE) 20 MG tablet, Take 2 tablets (40 mg total) by  mouth daily with breakfast for 5 days, THEN 1 tablet (20 mg total) daily with breakfast for 5 days., Disp: 15 tablet, Rfl: 0   rosuvastatin (CRESTOR) 20 MG tablet, Take 0.5 tablets (10 mg total) by mouth daily., Disp: 45 tablet, Rfl: 2   triamcinolone cream (KENALOG) 0.1 %, Apply 1 Application topically 2 (two) times daily as needed (rash)., Disp: , Rfl:    valACYclovir (VALTREX) 500 MG tablet, Take 500 mg by mouth 2 (two) times daily., Disp: , Rfl: 0   nitroGLYCERIN (NITROSTAT) 0.4 MG SL tablet, Place 1 tablet (0.4 mg total) under the tongue every 5 (five) minutes as needed. (Patient taking differently: Place 0.4 mg under  the tongue every 5 (five) minutes as needed for chest pain.), Disp: 30 tablet, Rfl: 3   Objective:     Vitals:   03/07/23 1025  Pulse: (!) 112  SpO2: 98%  Weight: 149 lb (67.6 kg)  Height: 5' (1.524 m)      Body mass index is 29.1 kg/m.    Physical Exam:    General:  awake, alert oriented, no acute distress nontoxic Skin: no suspicious lesions or rashes Neuro:sensation intact and strength 5/5 with no deficits, no atrophy, normal muscle tone Psych: No signs of anxiety, depression or other mood disorder  Right knee: Crepitus No swelling No deformity Neg fluid wave, joint milking ROM Flex 110, Ext 0 NTTP over the quad tendon, medial fem condyle, lat fem condyle, patella, plica, patella tendon, tibial tuberostiy, fibular head, posterior fossa, pes anserine bursa, gerdy's tubercle, medial jt line, lateral jt line    Gait normal    Electronically signed by:  Heidi Franklin [facility] 10:50 AM 03/07/23

## 2023-03-07 ENCOUNTER — Ambulatory Visit: Payer: Medicare HMO | Admitting: Sports Medicine

## 2023-03-07 VITALS — HR 112 | Ht 60.0 in | Wt 149.0 lb

## 2023-03-07 DIAGNOSIS — M1711 Unilateral primary osteoarthritis, right knee: Secondary | ICD-10-CM

## 2023-03-07 DIAGNOSIS — M25561 Pain in right knee: Secondary | ICD-10-CM

## 2023-03-07 DIAGNOSIS — G8929 Other chronic pain: Secondary | ICD-10-CM | POA: Diagnosis not present

## 2023-03-07 NOTE — Patient Instructions (Addendum)
Good to see you May continue tylenol and Voltaren gel  As needed follow up if pain returns call and ask for a zilretta injection

## 2023-03-11 DIAGNOSIS — L400 Psoriasis vulgaris: Secondary | ICD-10-CM | POA: Diagnosis not present

## 2023-03-25 DIAGNOSIS — L400 Psoriasis vulgaris: Secondary | ICD-10-CM | POA: Diagnosis not present

## 2023-03-27 ENCOUNTER — Ambulatory Visit: Payer: Medicare HMO | Admitting: Podiatry

## 2023-03-27 ENCOUNTER — Encounter: Payer: Self-pay | Admitting: Podiatry

## 2023-03-27 DIAGNOSIS — E119 Type 2 diabetes mellitus without complications: Secondary | ICD-10-CM

## 2023-03-27 DIAGNOSIS — M79674 Pain in right toe(s): Secondary | ICD-10-CM | POA: Diagnosis not present

## 2023-03-27 DIAGNOSIS — M79675 Pain in left toe(s): Secondary | ICD-10-CM

## 2023-03-27 DIAGNOSIS — B351 Tinea unguium: Secondary | ICD-10-CM

## 2023-03-27 DIAGNOSIS — Q828 Other specified congenital malformations of skin: Secondary | ICD-10-CM | POA: Diagnosis not present

## 2023-03-27 NOTE — Progress Notes (Signed)
  Subjective:  Patient ID: Heidi Franklin, female    DOB: 02-10-1946,   MRN: [id number]  Chief Complaint  Patient presents with   Diabetes    Diabetic foot care   Callouses    Callus trim     77 y.o. female presents for concern of thickened elongated and painful nails that are difficult to trim. Relates a callus as well that has developed.  Requesting to have them trimmed today. Relates burning and tingling in their feet. Patient is diabetic and last A1c was  Lab Results  Component Value Date   HGBA1C 6.7 (H) 02/10/2023   .     PCP:  Bradd Canary, MD    . Denies any other pedal complaints. Denies n/v/f/c.   Past Medical History:  Diagnosis Date   ALLERGIC RHINITIS    Allergy    Anxiety state, unspecified    Arthritis    Asthma    BRONCHITIS, CHRONIC    Cataract    b/l   Chronic low back pain 10/09/2016   DEPRESSION    DIABETES MELLITUS, TYPE II    GERD    HYPERLIPIDEMIA    HYPERTENSION    Pneumonia    Psoriasis     Objective:  Physical Exam: Vascular: DP/PT pulses 2/4 bilateral. CFT <3 seconds. Absent hair growth on digits. Edema noted to bilateral lower extremities. Xerosis noted bilaterally.  Skin. No lacerations or abrasions bilateral feet. Nails 1-5 bilateral  are thickened discolored and elongated with subungual debris. Hyperkeratotic cored lesion noted to plantar fifth metatarsal head.   Musculoskeletal: MMT 5/5 bilateral lower extremities in DF, PF, Inversion and Eversion. Deceased ROM in DF of ankle joint. Mild tender to insertion of achilles tendon on the right.  Neurological: Sensation intact to light touch. Protective sensation diminished bilateral.    Assessment:   1. Pain due to onychomycosis of toenails of both feet   2. Diabetes mellitus without complication (HCC)   3. Porokeratosis       Plan:  Patient was evaluated and treated and all questions answered. -Discussed and educated patient on diabetic foot care, especially with  regards to  the vascular, neurological and musculoskeletal systems.  -Stressed the importance of good glycemic control and the detriment of not  controlling glucose levels in relation to the foot. -Discussed supportive shoes at all times and checking feet regularly.  -Mechanically debrided all nails 1-5 bilateral using sterile nail nipper and filed with dremel without incident  -Hyperkeratotic tissue debrided without incident with chisel  -Answered all patient questions -Patient to return  in 3 months for at risk foot care -Patient advised to call the office if any problems or questions arise in the meantime.   Louann Sjogren, DPM

## 2023-03-28 ENCOUNTER — Telehealth: Payer: Self-pay | Admitting: Pulmonary Disease

## 2023-03-28 NOTE — Telephone Encounter (Signed)
PT would like more Pred. States as soon as she stopped taking it she started coughing again.   Dr.'s AVS notes state: I do think the main reason for your cough is reflux. I cannot fix this. I sent a message with a referral to the GI doctors to reevaluate other treatment for reflux more than just the pill.   Pharm is : [facility] [address] in [place]  Her number [phone]

## 2023-03-31 NOTE — Telephone Encounter (Signed)
Patient checking on message for cough. Patient phone number is [phone].

## 2023-03-31 NOTE — Telephone Encounter (Signed)
Attempted to call pt but unable to reach. Left message to return call.  

## 2023-03-31 NOTE — Telephone Encounter (Signed)
Patient is returning phone call. Patient phone number is [phone]. 

## 2023-03-31 NOTE — Telephone Encounter (Signed)
Can take prednisone 20 mg daily for 5 days, take with food. Dealt 2 teaspoons twice daily as needed for cough. Pantoprazole twice daily before meals. If symptoms or not improving will need office visit for further evaluation Please contact office for sooner follow up if symptoms do not improve or worsen or seek emergency care

## 2023-03-31 NOTE — Telephone Encounter (Signed)
Called and spoke with pt who states after being off of prednisone for about 2 weeks, she began to cough again. States her cough is mainly a dry cough that is a constant cough. Pt also states that she does also have some wheezing.  Stated to pt the info to pt from Dr. Judeth Horn about the reflux and asked her if she was taking any acid reflux medications and she said she was taking pantoprazole one pill in the morning about 30 minutes prior to eating. Stated to her that the instructions for the pantoprazole was for her to take it twice a day so she said she would try taking it that way to see if that helped some.  Due to pt's cough and also wheezing, she wanted to know if something might be able to be prescribed for her to help.  [name], please advise.

## 2023-04-01 MED ORDER — PREDNISONE 20 MG PO TABS: 40.0000 mg | ORAL_TABLET | Freq: Every day | ORAL | 0 refills | Status: DC

## 2023-04-01 NOTE — Telephone Encounter (Signed)
I called and spoke with the pt and notified of response per [name]  She verbalized understanding  Rx was sent to pharm for pred  Nothing further needed

## 2023-04-01 NOTE — Telephone Encounter (Signed)
Called the pt and there was no answer- LMTCB    

## 2023-04-01 NOTE — Telephone Encounter (Signed)
Patient is returning phone call. Patient phone number is [phone].

## 2023-04-03 NOTE — Telephone Encounter (Signed)
Patient was seen for recent chronic cough. Demand her to finish up prednisone as recommended. May use Delsym 2 teaspoons twice daily as needed for cough Tessalon Perles 200 mg 3 times daily as needed for cough #30 with 2 refills Continue on Xyzal and Flonase  Please advise her to discuss with her primary care provider that Vasotec her blood pressure medicine could be aggravating her cough.  May need to be changed.  Follow-up as planned if not improving will need sooner follow-up Please contact office for sooner follow up if symptoms do not improve or worsen or seek emergency care

## 2023-04-03 NOTE — Telephone Encounter (Signed)
Patient called to let the doctor that the medication for her cough is not working very well for her.  She states she has been taking it for 3 days and it is not getting better.  Please advise and call patient to discuss further.  CB# [phone]

## 2023-04-03 NOTE — Telephone Encounter (Signed)
Spoke with patient she states cough medication isn't working well for her. She is wanting to know if she can get something else sent in. States she is coughing a lot during the night. Patient states she is still wheezing and nose is running. Still has prednisone-has 4 left   [name] can you please advise?    Pharmacy [facility] [place]

## 2023-04-08 ENCOUNTER — Other Ambulatory Visit: Payer: Self-pay | Admitting: Family Medicine

## 2023-04-08 DIAGNOSIS — L400 Psoriasis vulgaris: Secondary | ICD-10-CM | POA: Diagnosis not present

## 2023-04-14 ENCOUNTER — Encounter: Payer: Self-pay | Admitting: Family Medicine

## 2023-04-14 ENCOUNTER — Telehealth: Payer: Self-pay | Admitting: Family Medicine

## 2023-04-14 NOTE — Telephone Encounter (Signed)
Heidi Franklin from [facility] has called stating they faxed over an DME order for diabetic supplies on 5.10.24. Has not heard anything back, states she will refax the orders today and give a follow up call on 6.24.24

## 2023-04-14 NOTE — Telephone Encounter (Signed)
Faxed paper work back.

## 2023-04-15 NOTE — Telephone Encounter (Signed)
Called pt lvm needing to spoke with regarding Losartan-HCTZ 100-12.5 MG. We received a refill request and Dr.Blyth said your not taking medication any more just needed to discuss the request.

## 2023-04-16 NOTE — Telephone Encounter (Signed)
Pt caled to go over info. All CMAs were unavailable at the time and advised a message would be sent back to look into this.

## 2023-04-17 DIAGNOSIS — Z1231 Encounter for screening mammogram for malignant neoplasm of breast: Secondary | ICD-10-CM | POA: Diagnosis not present

## 2023-04-17 LAB — HM MAMMOGRAPHY

## 2023-04-18 ENCOUNTER — Encounter: Payer: Self-pay | Admitting: Family Medicine

## 2023-04-21 ENCOUNTER — Encounter: Payer: Self-pay | Admitting: Family Medicine

## 2023-04-22 ENCOUNTER — Telehealth: Payer: Self-pay | Admitting: Internal Medicine

## 2023-04-22 ENCOUNTER — Ambulatory Visit (INDEPENDENT_AMBULATORY_CARE_PROVIDER_SITE_OTHER): Payer: Medicare HMO | Admitting: Medical

## 2023-04-22 ENCOUNTER — Telehealth: Payer: Self-pay | Admitting: Family Medicine

## 2023-04-22 VITALS — BP 106/50 | HR 82 | Resp 18 | Ht 60.0 in | Wt 150.0 lb

## 2023-04-22 DIAGNOSIS — L400 Psoriasis vulgaris: Secondary | ICD-10-CM | POA: Diagnosis not present

## 2023-04-22 DIAGNOSIS — H0015 Chalazion left lower eyelid: Secondary | ICD-10-CM | POA: Diagnosis not present

## 2023-04-22 MED ORDER — TOBRAMYCIN 0.3 % OP SOLN: 2.0000 [drp] | Freq: Four times a day (QID) | OPHTHALMIC | 0 refills | Status: DC

## 2023-04-22 NOTE — Telephone Encounter (Signed)
Pt scheduled to see Dr. Marina Goodell 05/06/23 at 11:40am. Left message for pt to call back.

## 2023-04-22 NOTE — Telephone Encounter (Signed)
Called pt and was advised of request and Pt stated she dont need any medication. Pt stated  She would let us know when will need more.

## 2023-04-22 NOTE — Telephone Encounter (Signed)
[facility] called back to advise they still have not received the order for diabetic supplies that was faxed on 04/16/23. Advised order was faxed on  04/14/23. Rep advised still not received. Please refax to the Attn:[facility] at [phone]

## 2023-04-22 NOTE — Addendum Note (Signed)
Addended by: Gwenevere Abbot on: 04/22/2023 02:10 PM   Modules accepted: Orders

## 2023-04-22 NOTE — Telephone Encounter (Signed)
Pt called stating that [facility] will not be able to facilitate this referral as the provider that facilitates those kinds of procedures is retiring. Pt will have to have referral rerouted to a different office when possible. Referral is marked as urgent, routed as high priority.

## 2023-04-22 NOTE — Telephone Encounter (Signed)
Inbound call from patient stating she has a referral for GERD and chronic cough. Patient was scheduled with Doug Sou on 9/5 at 11:00. Patient is requesting a call back to discuss symptoms and to see if there is anything she can do in the meantime. Please advise.

## 2023-04-22 NOTE — Progress Notes (Signed)
   Subjective:    Patient ID: Heidi Franklin, female    DOB: [birth year], 77 y.o.   MRN: [id number]  HPI Patient has left lower eyelid knot/growth per her report.  This started more than a year ago and she states it was initially just small raised area but over the past year has progressively got larger.  More prominent now in the last 2 months.  Patient states over the last year she saw optometrist initially who did not recommend treatment but did refer her to probable ophthalmologist..  She indicates that was not given diagnosis and no procedure was recommended.  Not reporting any vision changes or ocular pain.  The lid does not hurt but she thinks it is a little bit puffy compared to the right side.  No light flashes no visual disturbance.   Review of Systems See hpi.    Objective:   Physical Exam General Mental Status- Alert. General Appearance- Not in acute distress.     Chest and Lung Exam Auscultation: Breath Sounds:-Normal.  Cardiovascular Auscultation:Rythm- Regular. Murmurs & Other Heart Sounds:Auscultation of the heart reveals- No Murmurs.    Neurologic Cranial Nerve exam:- CN III-XII intact(No nystagmus), symmetric smile. Strength:- 5/5 equal and symmetric strength both upper and lower extremities.   Perrl bilaterally.  Left eye conjunctiva clear.  No discharge.  Left lower eyelid he has what appears to be 5 mm sized chin lesion versus cyst at the edge of eyelash.  Areas nontender.  Minimally faint puffy appearance of left lower lid.  But no redness or warmth to go lower lid.       Assessment & Plan:   Left lower eyelid chalazion versus cyst.  Present for 1 year in total. Will refer to ophthalmologist.  During the interim if the lower lid regions start to irritate could start Tobrex and apply warm compresses.  If during interim any vision changes or lower lid swelling indicating skin infection/cellulitis let us know.  Follow-up as regular scheduled with PCP or sooner  if needed.

## 2023-04-22 NOTE — Patient Instructions (Signed)
Left lower eyelid chalazion versus cyst.  Present for 1 year in total. Will refer to ophthalmologist.  During the interim if the lower lid regions start to irritate could start Tobrex and apply warm compresses.  If during interim any vision changes or lower lid swelling indicating skin infection/cellulitis let us know.  Follow-up as regular scheduled with PCP or sooner if needed.

## 2023-04-23 NOTE — Telephone Encounter (Signed)
Spoke with pt and she is aware of appt. 

## 2023-04-24 ENCOUNTER — Encounter: Payer: Self-pay | Admitting: Pulmonary Disease

## 2023-04-24 ENCOUNTER — Ambulatory Visit: Payer: Medicare HMO | Admitting: Pulmonary Disease

## 2023-04-24 ENCOUNTER — Telehealth: Payer: Self-pay | Admitting: Pulmonary Disease

## 2023-04-24 VITALS — BP 122/70 | HR 96 | Temp 98.7°F | Ht 60.0 in | Wt 148.6 lb

## 2023-04-24 DIAGNOSIS — R053 Chronic cough: Secondary | ICD-10-CM

## 2023-04-24 MED ORDER — PREDNISONE 20 MG PO TABS: ORAL_TABLET | ORAL | 0 refills | Status: AC

## 2023-04-24 NOTE — Progress Notes (Signed)
@Patient  ID: Heidi Franklin, female    DOB: 07/03/1946, 77 y.o.   MRN: [id number]  Chief Complaint  Patient presents with   Acute Visit    Cough x 1 week.    Referring provider: Bradd Canary, MD  HPI:   77 y.o. whom we are seeing in for acute visit for evaluation of chronic cough.  Most recent PCP note reviewed.  Multiple telephone notes from pulmonary clinic reviewed.   Cough recurred.  Intermittently gets better with steroids at times.  Steroid taper called in earlier this month via provider of the day.  Says it helps after a few days cough is back.  She complains of irritation in the back of her throat, oropharynx.  She complains of voice changes or hoarseness.  We discussed at length that given her reflux I think this is a significant contributor to her cough.  She does wheeze at night she states.  Asthma certainly is possible but she is on aggressive inhaler therapy.  I think prednisone helped because it decreases oropharyngeal inflammation related to reflux.  But we discussed escalation of biologic therapy in the future as a empiric trial to treat possible cough variant asthma.  HPI at initial visit:  Patient notes intermittent cough on and on since she was 77 years old.  Have been diagnosed with chronic bronchitis in the past.  Cough comes and goes and is persistent for up to 79-month stretches with interval improvement.  Cough is dry, nonproductive.  Worse at night or lying supine.  Has tried a number of oral medications as well as inhalers including albuterol and other ICS/LABA per her report.  Unclear how much the ICS/LABA helps.  Albuterol helps mildly.  Opiates cough suppressant seem to be the most helpful recently.  She reports intermittent courses of steroids that does help her cough.  No seasonal or environmental changes she can identify that make cough better or worse.  No better or worse when eats or drinks.  She endorses ongoing GERD symptoms despite daily PPI.  Both burn as  well as pressure or reflux symptoms.  This is nearly daily.  She states her "stomach does not empty" and she is worried this is the reason why she has reflux.  She keeps a very hoarse voice.  Reviewed most recent chest imaging chest x-ray 01/29/2021, interpretation shows clear lungs.  Reviewed CT scan 04/2017 and on my review and interpretation demonstrates bilateral mild lower lobe bronchiectasis.  Per review of pulmonary notes, she had been on Neurontin as well as Elavil in the past but had some falls.  Due to concern for medication or polypharmacy contributing, these medications were discontinued.  Seems like these medicines helped mildly per notes, not hugely successful.  PMH: Hypertension, diabetes, hyperlipidemia, seasonal allergies Surgical history: Hysterectomy Family history: CAD in first-degree relatives noted, denies respiratory illnesses in first-degree relatives Social history: Never smoker, lives in [place]  Questionaires / Pulmonary Flowsheets:   ACT:  Asthma Control Test ACT Total Score  03/06/2023  9:19 AM 11    MMRC:     No data to display           Epworth:      No data to display           Tests:   FENO:  Lab Results  Component Value Date   NITRICOXIDE 15 04/25/2017    PFT:    Latest Ref Rng & Units 12/24/2013    9:46 AM  PFT  Results  FVC-Pre L 2.14   FVC-Predicted Pre % 100   FVC-Post L 2.11   FVC-Predicted Post % 99   Pre FEV1/FVC % % 81   Post FEV1/FCV % % 80   FEV1-Pre L 1.72   FEV1-Predicted Pre % 104   FEV1-Post L 1.69   Personally reviewed, spirometry normal  WALK:      No data to display           Imaging: Personally reviewed and as per EMR discussion this note  Lab Results: Personally reviewed, eosinophils as high as 300 in the past CBC    Component Value Date/Time   WBC 10.8 (H) 02/10/2023 1106   RBC 4.08 02/10/2023 1106   HGB 12.7 02/10/2023 1106   HCT 38.6 02/10/2023 1106   PLT 247.0 02/10/2023 1106   MCV  94.6 02/10/2023 1106   MCH 31.5 05/31/2022 1522   MCHC 33.0 02/10/2023 1106   RDW 14.0 02/10/2023 1106   LYMPHSABS 5.0 (H) 02/10/2023 1106   MONOABS 0.8 02/10/2023 1106   EOSABS 0.1 02/10/2023 1106   BASOSABS 0.0 02/10/2023 1106    BMET    Component Value Date/Time   NA 141 02/19/2023 0824   NA 141 11/30/2021 1054   K 4.5 02/19/2023 0824   CL 107 02/19/2023 0824   CO2 27 02/19/2023 0824   GLUCOSE 109 (H) 02/19/2023 0824   BUN 19 02/19/2023 0824   BUN 12 11/30/2021 1054   CREATININE 1.01 02/19/2023 0824   CREATININE 1.05 (H) 06/09/2020 1519   CALCIUM 9.5 02/19/2023 0824   GFRNONAA >60 05/31/2022 1522   GFRAA >60 10/14/2015 1225    BNP No results found for: "BNP"  ProBNP No results found for: "PROBNP"  Specialty Problems       Pulmonary Problems   Chronic rhinitis    Followed in Pulmonary clinic/ [facility]/ [place]  CT sinus 11/08/11>>Negative paranasal sinuses.  Rightward nasal septal deviation and possible rhinitis. 01/13/12>>RAST negative, IgE 22.7       Upper airway cough syndrome    Followed in Pulmonary clinic/ [facility]/ [place]  CT sinus 11/08/11>>Negative paranasal sinuses.  Rightward nasal septal deviation and possible rhinitis. 01/13/12>>  RAST negative, IgE 22.7 PFT 11/07/11>>FEV1 1.79 (102%),  Ratio 82, TLC 4.18 (104%), DLCO 69%, positive BD only in terms of fef 25/75 -med calendar 11/23/2012 , 03/18/2013 , 08/26/2013 , 12/06/2013, 06/21/2014 > not keeping up with it ,redone  06/11/2016  -methacholine challenge test  12/24/13 > neg  - try off singulair 12/31/2013 > no worse  - added neurontin 100 mg qid  05/20/14 but did not take consistently as of 06/30/2014 > increased to 100 qid 08/23/2014 improved then stopped it and flared spirng of 2016  - 07/05/2015 increase neurontin 300 tid and added elavil 10 mg at hs > not clear she did this at f/u ov 08/03/2015  - added flutter valve 08/03/2015  - neurontin/ultram /elavil stopped 07/2015 due to falls > no worse  12/04/2015           Multiple pulmonary nodules    CT Renal  10/14/16  x 3 nodules all < 6 mm       Cough    Has struggled with recurrent bronchitis since the age of 38 and has been evaluated and seen recurrently by pulmonary and allergist.       Reflux laryngitis   Irritable larynx syndrome   Bronchiectasis without complication (HCC)    See HRCT  05/01/17 with very mild bilateral  cylindrical bronchiectasis in the lower lobes bilaterally.      Asthma    Allergies  Allergen Reactions   Metformin And Related Diarrhea   Penicillins Rash    Immunization History  Administered Date(s) Administered   Fluad Quad(high Dose 65+) 06/25/2019, 07/14/2020, 07/17/2021   Influenza Split 08/20/2011, 07/30/2012, 06/28/2014   Influenza Whole 07/28/2009, 07/06/2010   Influenza, High Dose Seasonal PF 06/19/2018, 06/18/2022   Influenza, Seasonal, Injecte, Preservative Fre 06/25/2019   Influenza,inj,Quad PF,6+ Mos 06/23/2013, 07/05/2015, 06/20/2016, 06/23/2017   Influenza-Unspecified 06/28/2014   Moderna Covid-19 Vaccine Bivalent Booster 17yrs & up 09/07/2021   Moderna Sars-Covid-2 Vaccination 11/27/2019, 12/27/2019, 09/12/2020   Pneumococcal Conjugate-13 06/15/2015   Pneumococcal Polysaccharide-23 10/02/2011   Td 11/23/2013   Zoster Recombinat (Shingrix) 01/22/2022, 06/18/2022    Past Medical History:  Diagnosis Date   ALLERGIC RHINITIS    Allergy    Anxiety state, unspecified    Arthritis    Asthma    BRONCHITIS, CHRONIC    Cataract    b/l   Chronic low back pain 10/09/2016   DEPRESSION    DIABETES MELLITUS, TYPE II    GERD    HYPERLIPIDEMIA    HYPERTENSION    Pneumonia    Psoriasis     Tobacco History: Social History   Tobacco Use  Smoking Status Never   Passive exposure: Never  Smokeless Tobacco Never   Counseling given: Not Answered   Continue to not smoke  Outpatient Encounter Medications as of 04/24/2023  Medication Sig   acetaminophen (TYLENOL) 325 MG  tablet Take 650 mg by mouth every 6 (six) hours as needed for mild pain or fever.   albuterol (VENTOLIN HFA) 108 (90 Base) MCG/ACT inhaler INHALE 1-2 PUFFS INTO THE LUNGS EVERY 6 (SIX) HOURS AS NEEDED FOR WHEEZING OR SHORTNESS OF BREATH.   ALPRAZolam (XANAX) 0.5 MG tablet TAKE 1 TABLET BY MOUTH TWICE A DAY AS NEEDED FOR ANXIETY   amLODipine (NORVASC) 10 MG tablet Take 1 tablet (10 mg total) by mouth daily.   aspirin 81 MG chewable tablet Chew 81 mg by mouth daily.   betamethasone dipropionate (DIPROLENE) 0.05 % ointment Apply 1 application  topically 2 (two) times daily.   Blood Pressure Monitoring (ADULT BLOOD PRESSURE CUFF LG) KIT Check blood pressure as needed. (Patient taking differently: 1 each by Other route as needed (Check blood pressure). Check blood pressure as needed.)   budesonide-formoterol (SYMBICORT) 160-4.5 MCG/ACT inhaler Inhale 2 puffs into the lungs 2 (two) times daily.   Cholecalciferol (VITAMIN D3) 50 MCG (2000 UT) capsule Take 2,000 Units by mouth daily.   enalapril (VASOTEC) 2.5 MG tablet Take 1 tablet (2.5 mg total) by mouth daily.   famotidine (PEPCID) 40 MG tablet TAKE 1 TABLET BY MOUTH EVERY DAY   fluticasone (CUTIVATE) 0.05 % cream Apply 1 Application topically 2 (two) times daily.   fluticasone (FLONASE) 50 MCG/ACT nasal spray SPRAY 2 SPRAYS INTO EACH NOSTRIL EVERY DAY (Patient taking differently: Place 2 sprays into both nostrils daily.)   glimepiride (AMARYL) 1 MG tablet TAKE 1 TABLET BY MOUTH EVERY DAY WITH BREAKFAST   glucose blood (ONE TOUCH ULTRA TEST) test strip 1 each by Other route daily. Use 1 strips to check blood sugar twice a day Ex E11.9   HUMIRA PEN-PS/UV/ADOL HS START 40 MG/0.8ML PNKT Inject 1 Dose into the skin every 14 (fourteen) days. Every 2 weeks   HYDROcodone bit-homatropine (HYDROMET) 5-1.5 MG/5ML syrup Take 5 mLs by mouth 2 (two) times daily as needed for  cough.   HYDROcodone-acetaminophen (NORCO/VICODIN) 5-325 MG tablet Take 1 tablet by mouth  every 8 (eight) hours as needed. (Patient taking differently: Take 1 tablet by mouth every 8 (eight) hours as needed for moderate pain or severe pain.)   isosorbide mononitrate (IMDUR) 30 MG 24 hr tablet Take 1 tablet (30 mg total) by mouth daily.   ketoconazole (NIZORAL) 2 % cream Apply 1 Application topically daily as needed for irritation.   Lancets (ONETOUCH ULTRASOFT) lancets PRN (Patient taking differently: 1 each by Other route as needed for other (glucose check). PRN)   levocetirizine (XYZAL) 5 MG tablet TAKE 1 TABLET BY MOUTH EVERY DAY IN THE EVENING (Patient taking differently: Take 5 mg by mouth every evening.)   metoprolol succinate (TOPROL-XL) 50 MG 24 hr tablet Take 1 tablet (50 mg total) by mouth daily. Take with or immediately following a meal.   mometasone (ELOCON) 0.1 % ointment Apply 1 application  topically daily.   montelukast (SINGULAIR) 10 MG tablet Take 1 tablet (10 mg total) by mouth at bedtime.   Multiple Vitamins-Minerals (CENTRUM SILVER PO) Take 1 tablet by mouth every morning.   nitroGLYCERIN (NITRO-DUR) 0.4 mg/hr patch Place 1 patch (0.4 mg total) onto the skin daily. Place on achilles tendon once a day-   pantoprazole (PROTONIX) 40 MG tablet Take 1 tablet (40 mg total) by mouth 2 (two) times daily.   Potassium Chloride ER 20 MEQ TBCR TAKE 1 TABLET BY MOUTH EVERY DAY   predniSONE (DELTASONE) 20 MG tablet Take 2 tablets (40 mg total) by mouth daily with breakfast for 5 days, THEN 1 tablet (20 mg total) daily with breakfast for 5 days.   rosuvastatin (CRESTOR) 20 MG tablet Take 0.5 tablets (10 mg total) by mouth daily.   tobramycin (TOBREX) 0.3 % ophthalmic solution Place 2 drops into the left eye every 6 (six) hours.   triamcinolone cream (KENALOG) 0.1 % Apply 1 Application topically 2 (two) times daily as needed (rash).   valACYclovir (VALTREX) 500 MG tablet Take 500 mg by mouth 2 (two) times daily.   [DISCONTINUED] methylPREDNISolone (MEDROL) 4 MG tablet 5 tabs po x 1  day then 4 tabs po x 1 day then 3 tabs po x 1 day then 2 tabs po x 1 day then 1 tab po x 1 day and stop   nitroGLYCERIN (NITROSTAT) 0.4 MG SL tablet Place 1 tablet (0.4 mg total) under the tongue every 5 (five) minutes as needed. (Patient taking differently: Place 0.4 mg under the tongue every 5 (five) minutes as needed for chest pain.)   [DISCONTINUED] predniSONE (DELTASONE) 20 MG tablet Take 2 tablets (40 mg total) by mouth daily with breakfast. (Patient not taking: Reported on 04/24/2023)   No facility-administered encounter medications on file as of 04/24/2023.     Review of Systems  Review of Systems  N/a Physical Exam  BP 122/70 (BP Location: Left Arm, Patient Position: Sitting, Cuff Size: Normal)   Pulse 96   Temp 98.7 F (37.1 C) (Oral)   Ht 5' (1.524 m)   Wt 148 lb 9.6 oz (67.4 kg)   SpO2 95%   BMI 29.02 kg/m   Wt Readings from Last 5 Encounters:  04/24/23 148 lb 9.6 oz (67.4 kg)  04/22/23 150 lb (68 kg)  03/07/23 149 lb (67.6 kg)  03/06/23 149 lb 3.2 oz (67.7 kg)  02/10/23 148 lb 12.8 oz (67.5 kg)    BMI Readings from Last 5 Encounters:  04/24/23 29.02 kg/m  04/22/23 29.29  kg/m  03/07/23 29.10 kg/m  03/06/23 29.14 kg/m  02/10/23 29.06 kg/m     Physical Exam General: Well-appearing, no distress Eyes: EOMI, no icterus Neck: Supple no JVP Cardiovascular: Regular rate and rhythm, no murmur Pulmonary: Clear to auscultation bilaterally, good air movement, no wheeze, normal work of breathing Abdomen: Nondistended, bowel sounds present MSK: No synovitis, joint effusion Neuro: Normal gait, no weakness, hoarse voice Psych: Normal mood, full affect   Assessment & Plan:   Acute on chronic cough: Suspect multifactorial however most likely her largest contributor suspected to be reflux.  She has breakthrough reflux and heartburn symptoms despite daily PPI.  She has hoarse voice.  Bilateral lower lobe bronchiectasis likely sequela of chronic silent aspiration.   She has mechanical reason for lack of gastric emptying.  Concern for possible contribution of asthma given her longstanding history of atopic symptoms as well as "chronic bronchitis" as a child.  Seems prednisone helped from time to time.  However, she had a negative methacholine challenge in the past, however this is not very specific and not a good test for cough variant asthma.  Continue high-dose Symbicort.  Can trial Breztri in the future if needed.  Prior chest imaging clear.  Responded well to neuropathic treatment in the past but with side effects intolerable not a good option moving forward.  Repeat prednisone taper today.  Chest exam is clear.  Upcoming GI for further evaluation of reflux - she has oropharyngeal irritation and hoarseness that does not fit with asthma.  Consider Biologics in the future if GI intervention does not yield improved symptoms. If fails biologics then definitely NOT asthma as primary driver of symptoms.    Return in about 2 months (around 06/24/2023).   Karren Burly, MD 04/24/2023  I spent 40 minutes in the care of the patient including review of records, face-to-face visit, coordination of care.

## 2023-04-24 NOTE — Patient Instructions (Signed)
Nice to see you again  Take prednisone 40 minutes for 5 days then 20 mg for 5 days then stop  Continue all your inhalers  I do think reflux is a major issue with the cough I am eager to see what the GI doctors have to say  If they do not have anything to add we need to consider using Biologics or injection medicines to treat asthma in the future.  If this does not help the cough and asthma is not an issue but we may need to try it to find out.  Return to clinic in 2 months or sooner as needed with Dr. Judeth Horn

## 2023-04-24 NOTE — Telephone Encounter (Signed)
Pt is having a chronic cough, as well a tickle in her throat.

## 2023-04-24 NOTE — Telephone Encounter (Signed)
Called and spoke with the pt  She is c/o increased cough, tickle in her throat  Cough is non prod  She feels like there is something in her throat  She states her spouse told her that she is wheezing a lot in the night  Acute visit with MH today was scheduled  Nothing further needed

## 2023-04-25 ENCOUNTER — Other Ambulatory Visit: Payer: Self-pay | Admitting: Family Medicine

## 2023-04-28 ENCOUNTER — Telehealth: Payer: Self-pay | Admitting: Family Medicine

## 2023-04-28 ENCOUNTER — Other Ambulatory Visit: Payer: Self-pay | Admitting: Family Medicine

## 2023-04-28 NOTE — Telephone Encounter (Signed)
[facility] just wanted to advise they do not accept pts ins.

## 2023-05-05 ENCOUNTER — Ambulatory Visit (INDEPENDENT_AMBULATORY_CARE_PROVIDER_SITE_OTHER): Payer: Medicare HMO | Admitting: *Deleted

## 2023-05-05 DIAGNOSIS — Z0000 Encounter for general adult medical examination without abnormal findings: Secondary | ICD-10-CM | POA: Diagnosis not present

## 2023-05-05 NOTE — Progress Notes (Signed)
Subjective:   Heidi Franklin is a 77 y.o. female who presents for Medicare Annual (Subsequent) preventive examination.  Visit Complete: Virtual  I connected with  Heidi Franklin on 05/05/23 by a audio enabled telemedicine application and verified that I am speaking with the correct person using two identifiers.  Patient Location: Home  Provider Location: Office/Clinic  I discussed the limitations of evaluation and management by telemedicine. The patient expressed understanding and agreed to proceed.  Patient Medicare AWV questionnaire was completed by the patient on 05/03/23; I have confirmed that all information answered by patient is correct and no changes since this date.  Review of Systems     Cardiac Risk Factors include: advanced age (>97men, >17 women);obesity (BMI >30kg/m2);diabetes mellitus;dyslipidemia;hypertension     Objective:    There were no vitals filed for this visit. There is no height or weight on file to calculate BMI.     05/05/2023    9:05 AM 05/31/2022    2:59 PM 05/01/2022    9:04 AM 04/25/2021    9:02 AM 07/04/2020    9:17 AM 09/07/2019   11:07 AM 01/20/2018   11:21 AM  Advanced Directives  Does Patient Have a Medical Advance Directive? No No No No No No No  Does patient want to make changes to medical advance directive?       Yes (ED - Information included in AVS)  Would patient like information on creating a medical advance directive? No - Patient declined  No - Patient declined No - Patient declined No - Patient declined No - Patient declined     Current Medications (verified) Outpatient Encounter Medications as of 05/05/2023  Medication Sig   acetaminophen (TYLENOL) 325 MG tablet Take 650 mg by mouth every 6 (six) hours as needed for mild pain or fever.   albuterol (VENTOLIN HFA) 108 (90 Base) MCG/ACT inhaler INHALE 1-2 PUFFS INTO THE LUNGS EVERY 6 (SIX) HOURS AS NEEDED FOR WHEEZING OR SHORTNESS OF BREATH.   ALPRAZolam (XANAX) 0.5 MG tablet TAKE 1 TABLET  BY MOUTH TWICE A DAY AS NEEDED FOR ANXIETY   amLODipine (NORVASC) 10 MG tablet TAKE 1 TABLET BY MOUTH EVERY DAY   aspirin 81 MG chewable tablet Chew 81 mg by mouth daily.   betamethasone dipropionate (DIPROLENE) 0.05 % ointment Apply 1 application  topically 2 (two) times daily.   Blood Pressure Monitoring (ADULT BLOOD PRESSURE CUFF LG) KIT Check blood pressure as needed. (Patient taking differently: 1 each by Other route as needed (Check blood pressure). Check blood pressure as needed.)   budesonide-formoterol (SYMBICORT) 160-4.5 MCG/ACT inhaler Inhale 2 puffs into the lungs 2 (two) times daily.   Cholecalciferol (VITAMIN D3) 50 MCG (2000 UT) capsule Take 2,000 Units by mouth daily.   enalapril (VASOTEC) 2.5 MG tablet Take 1 tablet (2.5 mg total) by mouth daily.   famotidine (PEPCID) 40 MG tablet TAKE 1 TABLET BY MOUTH EVERY DAY   fluticasone (CUTIVATE) 0.05 % cream Apply 1 Application topically 2 (two) times daily.   fluticasone (FLONASE) 50 MCG/ACT nasal spray SPRAY 2 SPRAYS INTO EACH NOSTRIL EVERY DAY (Patient taking differently: Place 2 sprays into both nostrils daily.)   glimepiride (AMARYL) 1 MG tablet TAKE 1 TABLET BY MOUTH EVERY DAY WITH BREAKFAST   glucose blood (ONE TOUCH ULTRA TEST) test strip 1 each by Other route daily. Use 1 strips to check blood sugar twice a day Ex E11.9   HUMIRA PEN-PS/UV/ADOL HS START 40 MG/0.8ML PNKT Inject 1 Dose into  the skin every 14 (fourteen) days. Every 2 weeks   HYDROcodone bit-homatropine (HYDROMET) 5-1.5 MG/5ML syrup Take 5 mLs by mouth 2 (two) times daily as needed for cough.   HYDROcodone-acetaminophen (NORCO/VICODIN) 5-325 MG tablet Take 1 tablet by mouth every 8 (eight) hours as needed. (Patient taking differently: Take 1 tablet by mouth every 8 (eight) hours as needed for moderate pain or severe pain.)   isosorbide mononitrate (IMDUR) 30 MG 24 hr tablet Take 1 tablet (30 mg total) by mouth daily.   ketoconazole (NIZORAL) 2 % cream Apply 1  Application topically daily as needed for irritation.   Lancets (ONETOUCH ULTRASOFT) lancets PRN (Patient taking differently: 1 each by Other route as needed for other (glucose check). PRN)   levocetirizine (XYZAL) 5 MG tablet TAKE 1 TABLET BY MOUTH EVERY DAY IN THE EVENING (Patient taking differently: Take 5 mg by mouth every evening.)   metoprolol succinate (TOPROL-XL) 50 MG 24 hr tablet Take 1 tablet (50 mg total) by mouth daily. Take with or immediately following a meal.   mometasone (ELOCON) 0.1 % ointment Apply 1 application  topically daily.   montelukast (SINGULAIR) 10 MG tablet Take 1 tablet (10 mg total) by mouth at bedtime.   Multiple Vitamins-Minerals (CENTRUM SILVER PO) Take 1 tablet by mouth every morning.   nitroGLYCERIN (NITRO-DUR) 0.4 mg/hr patch Place 1 patch (0.4 mg total) onto the skin daily. Place on achilles tendon once a day-   nitroGLYCERIN (NITROSTAT) 0.4 MG SL tablet Place 1 tablet (0.4 mg total) under the tongue every 5 (five) minutes as needed. (Patient taking differently: Place 0.4 mg under the tongue every 5 (five) minutes as needed for chest pain.)   pantoprazole (PROTONIX) 40 MG tablet Take 1 tablet (40 mg total) by mouth 2 (two) times daily.   Potassium Chloride ER 20 MEQ TBCR TAKE 1 TABLET BY MOUTH EVERY DAY   rosuvastatin (CRESTOR) 20 MG tablet Take 0.5 tablets (10 mg total) by mouth daily.   tobramycin (TOBREX) 0.3 % ophthalmic solution Place 2 drops into the left eye every 6 (six) hours.   triamcinolone cream (KENALOG) 0.1 % Apply 1 Application topically 2 (two) times daily as needed (rash).   valACYclovir (VALTREX) 500 MG tablet Take 500 mg by mouth 2 (two) times daily.   No facility-administered encounter medications on file as of 05/05/2023.    Allergies (verified) Metformin and related and Penicillins   History: Past Medical History:  Diagnosis Date   ALLERGIC RHINITIS    Allergy    Anxiety state, unspecified    Arthritis    Asthma    BRONCHITIS,  CHRONIC    Cataract    b/l   Chronic low back pain 10/09/2016   DEPRESSION    DIABETES MELLITUS, TYPE II    GERD    HYPERLIPIDEMIA    HYPERTENSION    Pneumonia    Psoriasis    Past Surgical History:  Procedure Laterality Date   ABDOMINAL HYSTERECTOMY  1980   CARPAL TUNNEL RELEASE Right    COLONOSCOPY     TRIGGER FINGER RELEASE Left    Index finger   UPPER GASTROINTESTINAL ENDOSCOPY     Family History  Problem Relation Age of Onset   Stroke Mother    Angina Mother    Hyperlipidemia Mother    Hypertension Mother    Heart disease Mother    Depression Mother    Diabetes Father    Hyperlipidemia Other        Parent  Hypertension Other        Parent   Diabetes Sister        x1   CAD Sister    COPD Sister        smoker   Hyperlipidemia Sister    Hypertension Sister    Diabetes Brother        x 2   Hyperlipidemia Brother    Hypertension Brother    Diabetes Brother    Hyperlipidemia Brother    Hypertension Brother    CAD Sister    Arthritis Sister    Hyperlipidemia Sister    Hypertension Sister    Colon cancer Neg Hx    Esophageal cancer Neg Hx    Rectal cancer Neg Hx    Stomach cancer Neg Hx    Pancreatic cancer Neg Hx    Social History   Socioeconomic History   Marital status: Married    Spouse name: Not on file   Number of children: 0   Years of education: 30yr colge   Highest education level: Some college, no degree  Occupational History   Occupation: Retired   Tobacco Use   Smoking status: Never    Passive exposure: Never   Smokeless tobacco: Never  Vaping Use   Vaping Use: Never used  Substance and Sexual Activity   Alcohol use: No    Alcohol/week: 0.0 standard drinks of alcohol   Drug use: No   Sexual activity: Not Currently  Other Topics Concern   Not on file  Social History Narrative   Married, lives with spouse-retired from [employer] insurance   Not employed    Drinks coffee occasional, Consumes 1 soda a day    No dietary restrictions    Social Determinants of Health   Financial Resource Strain: Medium Risk (05/03/2023)   Overall Financial Resource Strain (CARDIA)    Difficulty of Paying Living Expenses: Somewhat hard  Food Insecurity: Patient Declined (05/03/2023)   Hunger Vital Sign    Worried About Running Out of Food in the Last Year: Patient declined    Ran Out of Food in the Last Year: Patient declined  Transportation Needs: No Transportation Needs (05/03/2023)   PRAPARE - Administrator, Civil Service (Medical): No    Lack of Transportation (Non-Medical): No  Physical Activity: Inactive (05/03/2023)   Exercise Vital Sign    Days of Exercise per Week: 0 days    Minutes of Exercise per Session: 10 min  Stress: Stress Concern Present (05/03/2023)   [employer] of Occupational Health - Occupational Stress Questionnaire    Feeling of Stress : To some extent  Social Connections: Unknown (05/03/2023)   Social Connection and Isolation Panel [NHANES]    Frequency of Communication with Friends and Family: Twice a week    Frequency of Social Gatherings with Friends and Family: Patient declined    Attends Religious Services: More than 4 times per year    Active Member of [employer] or Organizations: Yes    Attends Banker Meetings: 1 to 4 times per year    Marital Status: Married    Tobacco Counseling Counseling given: Not Answered   Clinical Intake:  Pre-visit preparation completed: Yes  Pain : No/denies pain     Nutritional Risks: None Diabetes: Yes CBG done?: No Did pt. bring in CBG monitor from home?: No  How often do you need to have someone help you when you read instructions, pamphlets, or other written materials from your doctor or pharmacy?:  2 - Rarely  Interpreter Needed?: No  Information entered by :: Donne Anon, CMA   Activities of Daily Living    05/03/2023    9:46 AM  In your present state of health, do you have any difficulty performing the following activities:   Hearing? 0  Vision? 0  Difficulty concentrating or making decisions? 0  Walking or climbing stairs? 0  Dressing or bathing? 0  Doing errands, shopping? 0  Preparing Food and eating ? N  Using the Toilet? N  In the past six months, have you accidently leaked urine? Y  Do you have problems with loss of bowel control? Y  Managing your Medications? N  Managing your Finances? Y  Housekeeping or managing your Housekeeping? N    Patient Care Team: Bradd Canary, MD as PCP - General (Family Medicine) Coralyn Helling, MD (Pulmonary Disease) Tuchman, Fanny Bien, DPM (Inactive) (Podiatry) Lunette Stands, MD (Orthopedic Surgery) Hilarie Fredrickson, MD (Gastroenterology) Huston Foley, MD (Neurology)  Indicate any recent Medical Services you may have received from other than [facility] providers in the past year (date may be approximate).     Assessment:   This is a routine wellness examination for Heidi Franklin.  Hearing/Vision screen No results found.  Dietary issues and exercise activities discussed:     Goals Addressed   None    Depression Screen    05/05/2023    9:04 AM 07/04/2022    8:38 AM 06/11/2022   11:34 AM 05/01/2022    9:10 AM 05/01/2022    9:05 AM 03/26/2022    8:47 AM 06/15/2021   10:32 AM  PHQ 2/9 Scores  PHQ - 2 Score 0 0 1 3 3  0 0  PHQ- 9 Score  0 3 4 4       Fall Risk    05/03/2023    9:46 AM 07/04/2022    8:38 AM 06/11/2022   11:34 AM 05/01/2022    9:05 AM 03/26/2022    8:46 AM  Fall Risk   Falls in the past year? 0 0 0 0 0  Number falls in past yr: 0 0 0 0 0  Injury with Fall? 0 0 0 0 0  Risk for fall due to : No Fall Risks   No Fall Risks No Fall Risks  Follow up Falls evaluation completed Falls evaluation completed  Falls evaluation completed Falls evaluation completed    MEDICARE RISK AT HOME:   TIMED UP AND GO:  Was the test performed?  No    Cognitive Function:    01/20/2018   11:26 AM  MMSE - Mini Mental State Exam  Orientation to time 5  Orientation to Place 5   Registration 3  Attention/ Calculation 5  Recall 2  Language- name 2 objects 2  Language- repeat 1  Language- follow 3 step command 3  Language- read & follow direction 1  Write a sentence 1  Copy design 1  Total score 29        05/05/2023    9:05 AM 05/01/2022    9:14 AM  6CIT Screen  What Year? 0 points 0 points  What month? 0 points 0 points  What time? 0 points 0 points  Count back from 20 0 points 0 points  Months in reverse 0 points 4 points  Repeat phrase 0 points 0 points  Total Score 0 points 4 points    Immunizations Immunization History  Administered Date(s) Administered   Fluad Quad(high Dose 65+) 06/25/2019, 07/14/2020,  07/17/2021   Influenza Split 08/20/2011, 07/30/2012, 06/28/2014   Influenza Whole 07/28/2009, 07/06/2010   Influenza, High Dose Seasonal PF 06/19/2018, 06/18/2022   Influenza, Seasonal, Injecte, Preservative Fre 06/25/2019   Influenza,inj,Quad PF,6+ Mos 06/23/2013, 07/05/2015, 06/20/2016, 06/23/2017   Influenza-Unspecified 06/28/2014   Moderna Covid-19 Vaccine Bivalent Booster 6yrs & up 09/07/2021   Moderna Sars-Covid-2 Vaccination 11/27/2019, 12/27/2019, 09/12/2020   Pneumococcal Conjugate-13 06/15/2015   Pneumococcal Polysaccharide-23 10/02/2011   Td 11/23/2013   Zoster Recombinant(Shingrix) 01/22/2022, 06/18/2022    TDAP status: Up to date  Flu Vaccine status: Up to date  Pneumococcal vaccine status: Up to date  Covid-19 vaccine status: Information provided on how to obtain vaccines.   Qualifies for Shingles Vaccine? Yes   Zostavax completed No   Shingrix Completed?: Yes  Screening Tests Health Maintenance  Topic Date Due   OPHTHALMOLOGY EXAM  10/17/2022   Medicare Annual Wellness (AWV)  05/02/2023   FOOT EXAM  04/23/2023   COVID-19 Vaccine (5 - 2023-24 season) 06/20/2023 (Originally 06/28/2022)   INFLUENZA VACCINE  05/29/2023   HEMOGLOBIN A1C  08/12/2023   DTaP/Tdap/Td (2 - Tdap) 11/24/2023   Diabetic kidney evaluation -  Urine ACR  02/10/2024   Diabetic kidney evaluation - eGFR measurement  02/19/2024   Pneumonia Vaccine 24+ Years old  Completed   DEXA SCAN  Completed   Hepatitis C Screening  Completed   Zoster Vaccines- Shingrix  Completed   HPV VACCINES  Aged Out   Colonoscopy  Discontinued    Health Maintenance  Health Maintenance Due  Topic Date Due   OPHTHALMOLOGY EXAM  10/17/2022   Medicare Annual Wellness (AWV)  05/02/2023   FOOT EXAM  04/23/2023    Colorectal cancer screening: No longer required.   Mammogram status: Completed 04/17/23. Repeat every year  Bone Density status: Completed 06/04/22. Results reflect: Bone density results: NORMAL. Repeat every 2 years.  Lung Cancer Screening: (Low Dose CT Chest recommended if Age 57-80 years, 20 pack-year currently smoking OR have quit w/in 15years.) does not qualify.   Additional Screening:  Hepatitis C Screening: does qualify; Completed 02/22/16  Vision Screening: Recommended annual ophthalmology exams for early detection of glaucoma and other disorders of the eye. Is the patient up to date with their annual eye exam?  Yes  Who is the provider or what is the name of the office in which the patient attends annual eye exams? [facility]. If pt is not established with a provider, would they like to be referred to a provider to establish care? No .   Dental Screening: Recommended annual dental exams for proper oral hygiene  Diabetic Foot Exam: Diabetic Foot Exam: Overdue, Pt has been advised about the importance in completing this exam. Pt is scheduled for diabetic foot exam on N/a.  Community Resource Referral / Chronic Care Management: CRR required this visit?  No   CCM required this visit?  No     Plan:     I have personally reviewed and noted the following in the patient's chart:   Medical and social history Use of alcohol, tobacco or illicit drugs  Current medications and supplements including opioid prescriptions. Patient is  currently taking opioid prescriptions. Information provided to patient regarding non-opioid alternatives. Patient advised to discuss non-opioid treatment plan with their provider. Functional ability and status Nutritional status Physical activity Advanced directives List of other physicians Hospitalizations, surgeries, and ER visits in previous 12 months Vitals Screenings to include cognitive, depression, and falls Referrals and appointments  In addition,  I have reviewed and discussed with patient certain preventive protocols, quality metrics, and best practice recommendations. A written personalized care plan for preventive services as well as general preventive health recommendations were provided to patient.     Donne Anon, CMA   05/05/2023   After Visit Summary: (MyChart) Due to this being a telephonic visit, the after visit summary with patients personalized plan was offered to patient via MyChart   Nurse Notes: None

## 2023-05-05 NOTE — Patient Instructions (Signed)
Heidi Franklin , Thank you for taking time to come for your Medicare Wellness Visit. I appreciate your ongoing commitment to your health goals. Please review the following plan we discussed and let me know if I can assist you in the future.     This is a list of the screening recommended for you and due dates:  Health Maintenance  Topic Date Due   Eye exam for diabetics  10/17/2022   Complete foot exam   04/23/2023   COVID-19 Vaccine (5 - 2023-24 season) 06/20/2023*   Flu Shot  05/29/2023   Hemoglobin A1C  08/12/2023   DTaP/Tdap/Td vaccine (2 - Tdap) 11/24/2023   Yearly kidney health urinalysis for diabetes  02/10/2024   Yearly kidney function blood test for diabetes  02/19/2024   Medicare Annual Wellness Visit  05/04/2024   Pneumonia Vaccine  Completed   DEXA scan (bone density measurement)  Completed   Hepatitis C Screening  Completed   Zoster (Shingles) Vaccine  Completed   HPV Vaccine  Aged Out   Colon Cancer Screening  Discontinued  *Topic was postponed. The date shown is not the original due date.    Next appointment: Follow up in one year for your annual wellness visit.   Preventive Care 27 Years and Older, Female Preventive care refers to lifestyle choices and visits with your health care provider that can promote health and wellness. What does preventive care include? A yearly physical exam. This is also called an annual well check. Dental exams once or twice a year. Routine eye exams. Ask your health care provider how often you should have your eyes checked. Personal lifestyle choices, including: Daily care of your teeth and gums. Regular physical activity. Eating a healthy diet. Avoiding tobacco and drug use. Limiting alcohol use. Practicing safe sex. Taking low-dose aspirin every day. Taking vitamin and mineral supplements as recommended by your health care provider. What happens during an annual well check? The services and screenings done by your health care  provider during your annual well check will depend on your age, overall health, lifestyle risk factors, and family history of disease. Counseling  Your health care provider may ask you questions about your: Alcohol use. Tobacco use. Drug use. Emotional well-being. Home and relationship well-being. Sexual activity. Eating habits. History of falls. Memory and ability to understand (cognition). Work and work Astronomer. Reproductive health. Screening  You may have the following tests or measurements: Height, weight, and BMI. Blood pressure. Lipid and cholesterol levels. These may be checked every 5 years, or more frequently if you are over 32 years old. Skin check. Lung cancer screening. You may have this screening every year starting at age 56 if you have a 30-pack-year history of smoking and currently smoke or have quit within the past 15 years. Fecal occult blood test (FOBT) of the stool. You may have this test every year starting at age 33. Flexible sigmoidoscopy or colonoscopy. You may have a sigmoidoscopy every 5 years or a colonoscopy every 10 years starting at age 51. Hepatitis C blood test. Hepatitis B blood test. Sexually transmitted disease (STD) testing. Diabetes screening. This is done by checking your blood sugar (glucose) after you have not eaten for a while (fasting). You may have this done every 1-3 years. Bone density scan. This is done to screen for osteoporosis. You may have this done starting at age 70. Mammogram. This may be done every 1-2 years. Talk to your health care provider about how often you should have  regular mammograms. Talk with your health care provider about your test results, treatment options, and if necessary, the need for more tests. Vaccines  Your health care provider may recommend certain vaccines, such as: Influenza vaccine. This is recommended every year. Tetanus, diphtheria, and acellular pertussis (Tdap, Td) vaccine. You may need a Td  booster every 10 years. Zoster vaccine. You may need this after age 28. Pneumococcal 13-valent conjugate (PCV13) vaccine. One dose is recommended after age 50. Pneumococcal polysaccharide (PPSV23) vaccine. One dose is recommended after age 20. Talk to your health care provider about which screenings and vaccines you need and how often you need them. This information is not intended to replace advice given to you by your health care provider. Make sure you discuss any questions you have with your health care provider. Document Released: 11/10/2015 Document Revised: 07/03/2016 Document Reviewed: 08/15/2015 Elsevier Interactive Patient Education  2017 [employer].  Fall Prevention in the Home Falls can cause injuries. They can happen to people of all ages. There are many things you can do to make your home safe and to help prevent falls. What can I do on the outside of my home? Regularly fix the edges of walkways and driveways and fix any cracks. Remove anything that might make you trip as you walk through a door, such as a raised step or threshold. Trim any bushes or trees on the path to your home. Use bright outdoor lighting. Clear any walking paths of anything that might make someone trip, such as rocks or tools. Regularly check to see if handrails are loose or broken. Make sure that both sides of any steps have handrails. Any raised decks and porches should have guardrails on the edges. Have any leaves, snow, or ice cleared regularly. Use sand or salt on walking paths during winter. Clean up any spills in your garage right away. This includes oil or grease spills. What can I do in the bathroom? Use night lights. Install grab bars by the toilet and in the tub and shower. Do not use towel bars as grab bars. Use non-skid mats or decals in the tub or shower. If you need to sit down in the shower, use a plastic, non-slip stool. Keep the floor dry. Clean up any water that spills on the floor  as soon as it happens. Remove soap buildup in the tub or shower regularly. Attach bath mats securely with double-sided non-slip rug tape. Do not have throw rugs and other things on the floor that can make you trip. What can I do in the bedroom? Use night lights. Make sure that you have a light by your bed that is easy to reach. Do not use any sheets or blankets that are too big for your bed. They should not hang down onto the floor. Have a firm chair that has side arms. You can use this for support while you get dressed. Do not have throw rugs and other things on the floor that can make you trip. What can I do in the kitchen? Clean up any spills right away. Avoid walking on wet floors. Keep items that you use a lot in easy-to-reach places. If you need to reach something above you, use a strong step stool that has a grab bar. Keep electrical cords out of the way. Do not use floor polish or wax that makes floors slippery. If you must use wax, use non-skid floor wax. Do not have throw rugs and other things on the floor that  can make you trip. What can I do with my stairs? Do not leave any items on the stairs. Make sure that there are handrails on both sides of the stairs and use them. Fix handrails that are broken or loose. Make sure that handrails are as long as the stairways. Check any carpeting to make sure that it is firmly attached to the stairs. Fix any carpet that is loose or worn. Avoid having throw rugs at the top or bottom of the stairs. If you do have throw rugs, attach them to the floor with carpet tape. Make sure that you have a light switch at the top of the stairs and the bottom of the stairs. If you do not have them, ask someone to add them for you. What else can I do to help prevent falls? Wear shoes that: Do not have high heels. Have rubber bottoms. Are comfortable and fit you well. Are closed at the toe. Do not wear sandals. If you use a stepladder: Make sure that it is  fully opened. Do not climb a closed stepladder. Make sure that both sides of the stepladder are locked into place. Ask someone to hold it for you, if possible. Clearly mark and make sure that you can see: Any grab bars or handrails. First and last steps. Where the edge of each step is. Use tools that help you move around (mobility aids) if they are needed. These include: Canes. Walkers. Scooters. Crutches. Turn on the lights when you go into a dark area. Replace any light bulbs as soon as they burn out. Set up your furniture so you have a clear path. Avoid moving your furniture around. If any of your floors are uneven, fix them. If there are any pets around you, be aware of where they are. Review your medicines with your doctor. Some medicines can make you feel dizzy. This can increase your chance of falling. Ask your doctor what other things that you can do to help prevent falls. This information is not intended to replace advice given to you by your health care provider. Make sure you discuss any questions you have with your health care provider. Document Released: 08/10/2009 Document Revised: 03/21/2016 Document Reviewed: 11/18/2014 Elsevier Interactive Patient Education  2017 [employer].

## 2023-05-06 ENCOUNTER — Encounter: Payer: Self-pay | Admitting: Internal Medicine

## 2023-05-06 ENCOUNTER — Ambulatory Visit: Payer: Medicare HMO | Admitting: Internal Medicine

## 2023-05-06 ENCOUNTER — Other Ambulatory Visit: Payer: Self-pay | Admitting: Family Medicine

## 2023-05-06 VITALS — BP 124/64 | HR 81 | Ht 60.0 in | Wt 150.1 lb

## 2023-05-06 DIAGNOSIS — D124 Benign neoplasm of descending colon: Secondary | ICD-10-CM | POA: Diagnosis not present

## 2023-05-06 DIAGNOSIS — K219 Gastro-esophageal reflux disease without esophagitis: Secondary | ICD-10-CM

## 2023-05-06 DIAGNOSIS — R053 Chronic cough: Secondary | ICD-10-CM | POA: Diagnosis not present

## 2023-05-06 DIAGNOSIS — L400 Psoriasis vulgaris: Secondary | ICD-10-CM | POA: Diagnosis not present

## 2023-05-06 NOTE — Progress Notes (Signed)
HISTORY OF PRESENT ILLNESS:  Heidi Franklin is a 77 y.o. female with multiple medical problems as listed below.  She is sent today at the urging of her pulmonologist, Dr. Judeth Horn, regarding worsening chronic cough possibly related to GERD.  The patient was last seen in this office September 25, 2022 regarding positive FIT testing.  See that dictation.  She subsequently underwent colonoscopy February 2024.  She was found to have a diminutive adenoma which was removed.  The exam was otherwise normal.  No future surveillance recommended.  Patient tells me that she has had problems with cough since she was a child.  She reports recurrent cases of bronchitis.  Feels that her current issues with cough that worsened over the past 6 months.  She has seen her PCP as well as pulmonary.  Pulmonary feels her cough is multifactorial.  She does have known GERD.  She did undergo upper endoscopy August 28, 2020 which was essentially normal.  Previous gastric emptying scan was mildly abnormal.  She does report some regurgitation type symptoms.  No pyrosis.  Her PPI was increased to 40 mg of pantoprazole twice daily in addition to Pepcid at night.  She has been on this for a few months.  Has not noticed much change in her chronic cough.  She does have some hoarseness.  She uses cough drops chronically.  Per her medication list, she is on enalapril.  She is not sure how long she has been on the medication.  I am not certain that this has been considered as a possible cause for chronic cough.  We discussed this today.  REVIEW OF SYSTEMS:  All non-GI ROS negative unless otherwise stated in the HPI except for sinus and allergy trouble, anxiety, arthritis, cough, fatigue, night sweats, urinary leakage, voice change, shortness of breath  Past Medical History:  Diagnosis Date   ALLERGIC RHINITIS    Allergy    Anxiety state, unspecified    Arthritis    Asthma    BRONCHITIS, CHRONIC    Cataract    b/l   Chronic low back  pain 10/09/2016   COPD (chronic obstructive pulmonary disease) (HCC)    DEPRESSION    DIABETES MELLITUS, TYPE II    GERD    HYPERLIPIDEMIA    HYPERTENSION    Pneumonia    Psoriasis     Past Surgical History:  Procedure Laterality Date   ABDOMINAL HYSTERECTOMY  1980   CARPAL TUNNEL RELEASE Right    COLONOSCOPY     TRIGGER FINGER RELEASE Left    Index finger   UPPER GASTROINTESTINAL ENDOSCOPY      Social History RIANSHI ECKARDT  reports that she has never smoked. She has never been exposed to tobacco smoke. She has never used smokeless tobacco. She reports that she does not drink alcohol and does not use drugs.  family history includes Angina in her mother; Arthritis in her sister; CAD in her sister and sister; COPD in her sister; Depression in her mother; Diabetes in her brother, brother, father, and sister; Heart disease in her mother; Hyperlipidemia in her brother, brother, mother, sister, sister, and another family member; Hypertension in her brother, brother, mother, sister, sister, and another family member; Stroke in her mother.  Allergies  Allergen Reactions   Metformin And Related Diarrhea   Penicillins Rash       PHYSICAL EXAMINATION: Vital signs: BP 124/64 (BP Location: Left Arm, Patient Position: Sitting, Cuff Size: Normal)   Pulse 81  Ht 5' (1.524 m)   Wt 150 lb 2 oz (68.1 kg)   SpO2 97%   BMI 29.32 kg/m   Constitutional: Pleasant, generally well-appearing, no acute distress Psychiatric: alert and oriented x3, cooperative Eyes: extraocular movements intact, anicteric, conjunctiva pink Mouth: oral pharynx moist, no lesions Neck: supple no lymphadenopathy Cardiovascular: heart regular rate and rhythm, no murmur Lungs: clear to auscultation bilaterally Abdomen: soft, nontender, nondistended, no obvious ascites, no peritoneal signs, normal bowel sounds, no organomegaly Rectal: Omitted Extremities: no clubbing, cyanosis, or lower extremity edema  bilaterally Skin: no lesions on visible extremities Neuro: No focal deficits.  Cranial nerves intact  ASSESSMENT:  1.  Chronic cough.  Etiology unclear.  Primary pulmonary versus GERD versus medication.  Unfortunately, she has not seen improvement in her cough on high-dose acid suppressive therapy over the past few months.  If GERD related, it may take a bit longer.  Or, simply, GERD may be a concurrent but not causative problem.  She does have significant pulmonary history and pulmonary abnormalities on imaging.  As for medications, I am concerned about enalapril which has the relatively common side effect of cough. 2.  History of nonadvanced adenoma 2.  Aged out of surveillance 3.  Multiple medical problems   PLAN:  1.  Reflux precautions 2.  Continue high-dose acid suppressive therapy (pantoprazole 40 mg twice daily, Pepcid at night) for now.  Would continue for at least 4 months to give an adequate trial.  If no effect.  Then return to once daily pantoprazole, which controls her classic reflux symptoms.  She does continue with symptoms of regurgitation. 3.  Schedule upper endoscopy.  Rule out other entities provide dyspeptic complaints chronic cough.The nature of the procedure, as well as the risks, benefits, and alternatives were carefully and thoroughly reviewed with the patient. Ample time for discussion and questions allowed. The patient understood, was satisfied, and agreed to proceed. 4.  She should discuss with her PCP prospects of discontinuing enalapril, to see if this helps her chronic cough 5.  Ongoing pulmonary care with Dr. Judeth Horn A total time of 40 minutes was spent preparing to see the patient, reviewing data, obtaining comprehensive history, performing medically appropriate physical examination, counseling educating patient regarding the above listed issues, directing her GERD therapy, ordering endoscopic procedure, and documenting clinical information in the health  record

## 2023-05-06 NOTE — Patient Instructions (Signed)
You have been scheduled for an endoscopy. Please follow written instructions given to you at your visit today.  If you use inhalers (even only as needed), please bring them with you on the day of your procedure.  If you take any of the following medications, they will need to be adjusted prior to your procedure:   DO NOT TAKE 7 DAYS PRIOR TO TEST- Trulicity (dulaglutide) Ozempic, Wegovy (semaglutide) Mounjaro (tirzepatide) Bydureon Bcise (exanatide extended release)  DO NOT TAKE 1 DAY PRIOR TO YOUR TEST Rybelsus (semaglutide) Adlyxin (lixisenatide) Victoza (liraglutide) Byetta (exanatide) ___________________________________________________________________________    _______________________________________________________  If your blood pressure at your visit was 140/90 or greater, please contact your primary care physician to follow up on this.  _______________________________________________________  If you are age 35 or older, your body mass index should be between 23-30. Your Body mass index is 29.32 kg/m. If this is out of the aforementioned range listed, please consider follow up with your Primary Care Provider.  If you are age 42 or younger, your body mass index should be between 19-25. Your Body mass index is 29.32 kg/m. If this is out of the aformentioned range listed, please consider follow up with your Primary Care Provider.   ________________________________________________________  The [place] [facility] providers would like to encourage you to use [facility] to communicate with providers for non-urgent requests or questions.  Due to long hold times on the telephone, sending your provider a message by [facility] may be a faster and more efficient way to get a response.  Please allow 48 business hours for a response.  Please remember that this is for non-urgent requests.  _______________________________________________________

## 2023-05-07 ENCOUNTER — Other Ambulatory Visit: Payer: Self-pay | Admitting: Family Medicine

## 2023-05-07 ENCOUNTER — Encounter: Payer: Self-pay | Admitting: Family Medicine

## 2023-05-07 NOTE — Telephone Encounter (Signed)
Called spoke with pt regarding medication Enalapril to hold it for a month and see if how he cough do, if coughing improves After that will let us know how things are and if we need to try something else.Pt stated she understand, would let us know.

## 2023-05-09 DIAGNOSIS — D23122 Other benign neoplasm of skin of left lower eyelid, including canthus: Secondary | ICD-10-CM | POA: Diagnosis not present

## 2023-05-09 DIAGNOSIS — H04123 Dry eye syndrome of bilateral lacrimal glands: Secondary | ICD-10-CM | POA: Diagnosis not present

## 2023-05-13 ENCOUNTER — Encounter: Payer: Self-pay | Admitting: Family Medicine

## 2023-05-13 DIAGNOSIS — E1065 Type 1 diabetes mellitus with hyperglycemia: Secondary | ICD-10-CM | POA: Diagnosis not present

## 2023-05-14 ENCOUNTER — Other Ambulatory Visit: Payer: Self-pay | Admitting: Family Medicine

## 2023-05-14 DIAGNOSIS — D23122 Other benign neoplasm of skin of left lower eyelid, including canthus: Secondary | ICD-10-CM | POA: Diagnosis not present

## 2023-05-15 ENCOUNTER — Encounter: Payer: Self-pay | Admitting: Emergency Medicine

## 2023-05-15 ENCOUNTER — Ambulatory Visit
Admission: EM | Admit: 2023-05-15 | Discharge: 2023-05-15 | Disposition: A | Discharge status: Home / Self Care | Payer: Medicare HMO | Attending: Family Medicine | Admitting: Family Medicine

## 2023-05-15 ENCOUNTER — Telehealth: Payer: Self-pay | Admitting: Family Medicine

## 2023-05-15 ENCOUNTER — Other Ambulatory Visit: Payer: Self-pay

## 2023-05-15 ENCOUNTER — Ambulatory Visit: Payer: Medicare HMO | Admitting: Sports Medicine

## 2023-05-15 ENCOUNTER — Telehealth: Payer: Self-pay

## 2023-05-15 ENCOUNTER — Ambulatory Visit (INDEPENDENT_AMBULATORY_CARE_PROVIDER_SITE_OTHER): Payer: Medicare HMO

## 2023-05-15 VITALS — BP 136/80 | HR 81 | Ht 60.0 in | Wt 151.0 lb

## 2023-05-15 DIAGNOSIS — M1711 Unilateral primary osteoarthritis, right knee: Secondary | ICD-10-CM

## 2023-05-15 DIAGNOSIS — G8929 Other chronic pain: Secondary | ICD-10-CM

## 2023-05-15 DIAGNOSIS — M545 Low back pain, unspecified: Secondary | ICD-10-CM

## 2023-05-15 DIAGNOSIS — M25561 Pain in right knee: Secondary | ICD-10-CM | POA: Diagnosis not present

## 2023-05-15 DIAGNOSIS — M85861 Other specified disorders of bone density and structure, right lower leg: Secondary | ICD-10-CM | POA: Diagnosis not present

## 2023-05-15 DIAGNOSIS — I700 Atherosclerosis of aorta: Secondary | ICD-10-CM | POA: Diagnosis not present

## 2023-05-15 MED ORDER — BACLOFEN 5 MG PO TABS: 5.0000 mg | ORAL_TABLET | Freq: Two times a day (BID) | ORAL | 0 refills | Status: DC | PRN

## 2023-05-15 MED ORDER — HYDROCODONE-ACETAMINOPHEN 5-325 MG PO TABS: 1.0000 | ORAL_TABLET | Freq: Four times a day (QID) | ORAL | 0 refills | Status: DC | PRN

## 2023-05-15 MED ORDER — MELOXICAM 15 MG PO TABS: 15.0000 mg | ORAL_TABLET | Freq: Every day | ORAL | 0 refills | Status: DC

## 2023-05-15 MED ORDER — PROMETHAZINE-DM 6.25-15 MG/5ML PO SYRP: 5.0000 mL | ORAL_SOLUTION | Freq: Two times a day (BID) | ORAL | 0 refills | Status: DC | PRN

## 2023-05-15 NOTE — Patient Instructions (Addendum)
-   Start meloxicam 15 mg daily x2 weeks.May use remaining meloxicam as needed once daily for pain control.  Do not to use additional NSAIDs while taking meloxicam.  May use Tylenol [phone] mg 2 to 3 times a day for breakthrough pain. Does not need to pick up baclofen or hydrocodone Zilretta approval  3 week follow up

## 2023-05-15 NOTE — Telephone Encounter (Signed)
Patient said she tried to refill her cough medicine at the pharmacy but they said they were unable to refill but they did not tell her why. Pt was unsure of the name so would like a call from [facility].

## 2023-05-15 NOTE — Discharge Instructions (Signed)
The radiologist did not see any broken bones or acute changes on your xrays. There were some chronic arthritis abnormalities.  Hydrocodone 5 mg--1 tablet every 6 hours as needed for pain.  This is best taken with food.  It can cause sleepiness or dizziness  Take baclofen 5 mg--1 tablet every 12 hours as needed for muscle spasm or muscle pain.  This medication could cause drowsiness or dizziness  Be careful taking these 2 medications close together.   Please followup with your sports medicine provider soon about these issues
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
## 2023-05-15 NOTE — Addendum Note (Signed)
Addended by: Kathi Ludwig on: 05/15/2023 02:27 PM   Modules accepted: Orders
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
## 2023-05-15 NOTE — Telephone Encounter (Signed)
Called pt back regarding Promethazine 5 mls,  Medication refill request sent to Dr.Blyth.

## 2023-05-15 NOTE — Telephone Encounter (Signed)
Called pt refill has been sent.

## 2023-05-15 NOTE — Addendum Note (Signed)
Addended by: Richardean Sale on: 05/15/2023 02:32 PM   Modules accepted: Orders
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
## 2023-05-16 NOTE — Telephone Encounter (Signed)
Called [facility] regarding suppler for pt diabetic testing supplies prescription.too call us back .

## 2023-05-20 ENCOUNTER — Telehealth: Payer: Self-pay | Admitting: Sports Medicine

## 2023-05-20 DIAGNOSIS — L400 Psoriasis vulgaris: Secondary | ICD-10-CM | POA: Diagnosis not present

## 2023-05-20 NOTE — Telephone Encounter (Signed)
Pt called. Knee has "calmed down" since visit but her lower, left sided back pain has not. She is using meloxicam/tylenol but not much relief, is hesitant to take meds due to kidney issues.  Can we recommend a topical that might help?

## 2023-05-20 NOTE — Telephone Encounter (Signed)
Pt given Dr. Edison Pace recommendations, agreed to try.

## 2023-05-20 NOTE — Telephone Encounter (Signed)
Left VM to call back 

## 2023-05-23 ENCOUNTER — Other Ambulatory Visit: Payer: Self-pay | Admitting: Family Medicine

## 2023-05-23 DIAGNOSIS — R1013 Epigastric pain: Secondary | ICD-10-CM

## 2023-05-23 DIAGNOSIS — M7552 Bursitis of left shoulder: Secondary | ICD-10-CM

## 2023-05-26 NOTE — Telephone Encounter (Signed)
Patient called stating that she has been trying Dr Edison Pace suggestions but there has not been any improvement at this point. She asked if Dr Jean Rosenthal had any other ideas?  Please advise.

## 2023-05-27 ENCOUNTER — Ambulatory Visit (AMBULATORY_SURGERY_CENTER): Payer: Medicare HMO | Admitting: Internal Medicine

## 2023-05-27 ENCOUNTER — Encounter: Payer: Self-pay | Admitting: Internal Medicine

## 2023-05-27 VITALS — BP 117/60 | HR 75 | Temp 97.5°F | Resp 15 | Ht 60.0 in | Wt 150.0 lb

## 2023-05-27 DIAGNOSIS — K219 Gastro-esophageal reflux disease without esophagitis: Secondary | ICD-10-CM | POA: Diagnosis not present

## 2023-05-27 DIAGNOSIS — K449 Diaphragmatic hernia without obstruction or gangrene: Secondary | ICD-10-CM

## 2023-05-27 DIAGNOSIS — E119 Type 2 diabetes mellitus without complications: Secondary | ICD-10-CM | POA: Diagnosis not present

## 2023-05-27 DIAGNOSIS — R053 Chronic cough: Secondary | ICD-10-CM

## 2023-05-27 DIAGNOSIS — J449 Chronic obstructive pulmonary disease, unspecified: Secondary | ICD-10-CM | POA: Diagnosis not present

## 2023-05-27 DIAGNOSIS — I10 Essential (primary) hypertension: Secondary | ICD-10-CM | POA: Diagnosis not present

## 2023-05-27 DIAGNOSIS — I2510 Atherosclerotic heart disease of native coronary artery without angina pectoris: Secondary | ICD-10-CM | POA: Diagnosis not present

## 2023-05-27 MED ORDER — PANTOPRAZOLE SODIUM 40 MG PO TBEC
40.0000 mg | DELAYED_RELEASE_TABLET | Freq: Two times a day (BID) | ORAL | 6 refills | Status: DC
Start: 2023-05-27 — End: 2024-04-27

## 2023-05-27 MED ORDER — SODIUM CHLORIDE 0.9 % IV SOLN: 500.0000 mL | Freq: Once | INTRAVENOUS | Status: DC

## 2023-05-27 NOTE — Op Note (Signed)
[facility] Patient Name: Heidi Franklin Procedure Date: 05/27/2023 9:51 AM MRN: [id number] Endoscopist: Wilhemina Bonito. Marina Goodell , MD, [id number] Age: 77 Referring MD:  Date of Birth: Jun 27, 1946 Gender: Female Account #: [id number] Procedure:                Upper GI endoscopy Indications:              Esophageal reflux, Chronic cough. Has been                            evaluated by pulmonary. Enalapril recently stopped.                            On high-dose acid suppressive therapy. Symptoms                            ongoing Medicines:                Monitored Anesthesia Care Procedure:                Pre-Anesthesia Assessment:                           - Prior to the procedure, a History and Physical                            was performed, and patient medications and                            allergies were reviewed. The patient's tolerance of                            previous anesthesia was also reviewed. The risks                            and benefits of the procedure and the sedation                            options and risks were discussed with the patient.                            All questions were answered, and informed consent                            was obtained. Prior Anticoagulants: The patient has                            taken no anticoagulant or antiplatelet agents. ASA                            Grade Assessment: II - A patient with mild systemic                            disease. After reviewing the risks and benefits,  the patient was deemed in satisfactory condition to                            undergo the procedure.                           After obtaining informed consent, the endoscope was                            passed under direct vision. Throughout the                            procedure, the patient's blood pressure, pulse, and                            oxygen saturations were monitored continuously. The                             GIF [id number] #[id number] was introduced through the                            mouth, and advanced to the second part of duodenum.                            The upper GI endoscopy was accomplished without                            difficulty. The patient tolerated the procedure                            well. Scope In: Scope Out: Findings:                 The esophagus was normal.                           The stomach was normal. Small hiatal hernia.                           The examined duodenum was normal.                           The cardia and gastric fundus were normal on                            retroflexion. Complications:            No immediate complications. Estimated Blood Loss:     Estimated blood loss: none. Impression:               - Normal esophagus.                           - Normal stomach. Small hiatal hernia.                           - Normal examined duodenum.                           -  Not convinced that GERD is causing this patient's                            chronic cough. Recommendation:           - Patient has a contact number available for                            emergencies. The signs and symptoms of potential                            delayed complications were discussed with the                            patient. Return to normal activities tomorrow.                            Written discharge instructions were provided to the                            patient.                           - Resume previous diet.                           - Continue present medications.                           - PRESCRIBE pantoprazole 40 mg p.o. twice daily;                            #60; 6 refills. Take this twice daily therapy plus                            famotidine at night for additional 2 months. If no                            cough, then return to once daily therapy to address                            typical reflux  symptoms                           - Return to the care of your primary provider Wilhemina Bonito. Marina Goodell, MD 05/27/2023 10:18:37 AM This report has been signed electronically.

## 2023-05-27 NOTE — Patient Instructions (Signed)
Discharge instructions given. Handouts on Gerd and Hiatal Hernia. Prescription sent to pharmacy. Take protonix twice a day plus Famotidine at night for additional 2 months. If no cough, then return to once daily therapy to address typical reflux symptoms. YOU HAD AN ENDOSCOPIC PROCEDURE TODAY AT THE [facility]:   Refer to the procedure report that was given to you for any specific questions about what was found during the examination.  If the procedure report does not answer your questions, please call your gastroenterologist to clarify.  If you requested that your care partner not be given the details of your procedure findings, then the procedure report has been included in a sealed envelope for you to review at your convenience later.  YOU SHOULD EXPECT: Some feelings of bloating in the abdomen. Passage of more gas than usual.  Walking can help get rid of the air that was put into your GI tract during the procedure and reduce the bloating. If you had a lower endoscopy (such as a colonoscopy or flexible sigmoidoscopy) you may notice spotting of blood in your stool or on the toilet paper. If you underwent a bowel prep for your procedure, you may not have a normal bowel movement for a few days.  Please Note:  You might notice some irritation and congestion in your nose or some drainage.  This is from the oxygen used during your procedure.  There is no need for concern and it should clear up in a day or so.  SYMPTOMS TO REPORT IMMEDIATELY:   Following upper endoscopy (EGD)  Vomiting of blood or coffee ground material  New chest pain or pain under the shoulder blades  Painful or persistently difficult swallowing  New shortness of breath  Fever of 100F or higher  Black, tarry-looking stools  For urgent or emergent issues, a gastroenterologist can be reached at any hour by calling [phone]. Do not use MyChart messaging for urgent concerns.    DIET:  We do recommend a small  meal at first, but then you may proceed to your regular diet.  Drink plenty of fluids but you should avoid alcoholic beverages for 24 hours.  ACTIVITY:  You should plan to take it easy for the rest of today and you should NOT DRIVE or use heavy machinery until tomorrow (because of the sedation medicines used during the test).    FOLLOW UP: Our staff will call the number listed on your records the next business day following your procedure.  We will call around 7:15- 8:00 am to check on you and address any questions or concerns that you may have regarding the information given to you following your procedure. If we do not reach you, we will leave a message.     If any biopsies were taken you will be contacted by phone or by letter within the next 1-3 weeks.  Please call us at [phone] if you have not heard about the biopsies in 3 weeks.    SIGNATURES/CONFIDENTIALITY: You and/or your care partner have signed paperwork which will be entered into your electronic medical record.  These signatures attest to the fact that that the information above on your After Visit Summary has been reviewed and is understood.  Full responsibility of the confidentiality of this discharge information lies with you and/or your care-partner.

## 2023-05-27 NOTE — Progress Notes (Signed)
VS completed by SM.  Pt's states no medical or surgical changes since previsit or office visit.  

## 2023-05-27 NOTE — Telephone Encounter (Signed)
Appointment scheduled.

## 2023-05-27 NOTE — Progress Notes (Signed)
Expand All Collapse All HISTORY OF PRESENT ILLNESS:   Heidi Franklin is a 77 y.o. female with multiple medical problems as listed below.  She is sent today at the urging of her pulmonologist, Dr. Judeth Horn, regarding worsening chronic cough possibly related to GERD.  The patient was last seen in this office September 25, 2022 regarding positive FIT testing.  See that dictation.  She subsequently underwent colonoscopy February 2024.  She was found to have a diminutive adenoma which was removed.  The exam was otherwise normal.  No future surveillance recommended.   Patient tells me that she has had problems with cough since she was a child.  She reports recurrent cases of bronchitis.  Feels that her current issues with cough that worsened over the past 6 months.  She has seen her PCP as well as pulmonary.  Pulmonary feels her cough is multifactorial.  She does have known GERD.  She did undergo upper endoscopy August 28, 2020 which was essentially normal.  Previous gastric emptying scan was mildly abnormal.  She does report some regurgitation type symptoms.  No pyrosis.  Her PPI was increased to 40 mg of pantoprazole twice daily in addition to Pepcid at night.  She has been on this for a few months.  Has not noticed much change in her chronic cough.  She does have some hoarseness.  She uses cough drops chronically.  Per her medication list, she is on enalapril.  She is not sure how long she has been on the medication.  I am not certain that this has been considered as a possible cause for chronic cough.  We discussed this today.   REVIEW OF SYSTEMS:   All non-GI ROS negative unless otherwise stated in the HPI except for sinus and allergy trouble, anxiety, arthritis, cough, fatigue, night sweats, urinary leakage, voice change, shortness of breath       Past Medical History:  Diagnosis Date   ALLERGIC RHINITIS     Allergy     Anxiety state, unspecified     Arthritis     Asthma     BRONCHITIS, CHRONIC      Cataract      b/l   Chronic low back pain 10/09/2016   COPD (chronic obstructive pulmonary disease) (HCC)     DEPRESSION     DIABETES MELLITUS, TYPE II     GERD     HYPERLIPIDEMIA     HYPERTENSION     Pneumonia     Psoriasis                 Past Surgical History:  Procedure Laterality Date   ABDOMINAL HYSTERECTOMY   1980   CARPAL TUNNEL RELEASE Right     COLONOSCOPY       TRIGGER FINGER RELEASE Left      Index finger   UPPER GASTROINTESTINAL ENDOSCOPY              Social History Heidi Franklin  reports that she has never smoked. She has never been exposed to tobacco smoke. She has never used smokeless tobacco. She reports that she does not drink alcohol and does not use drugs.   family history includes Angina in her mother; Arthritis in her sister; CAD in her sister and sister; COPD in her sister; Depression in her mother; Diabetes in her brother, brother, father, and sister; Heart disease in her mother; Hyperlipidemia in her brother, brother, mother, sister, sister, and another family member; Hypertension in her brother,  brother, mother, sister, sister, and another family member; Stroke in her mother.   Allergies      Allergies  Allergen Reactions   Metformin And Related Diarrhea   Penicillins Rash            PHYSICAL EXAMINATION: Vital signs: BP 124/64 (BP Location: Left Arm, Patient Position: Sitting, Cuff Size: Normal)   Pulse 81   Ht 5' (1.524 m)   Wt 150 lb 2 oz (68.1 kg)   SpO2 97%   BMI 29.32 kg/m   Constitutional: Pleasant, generally well-appearing, no acute distress Psychiatric: alert and oriented x3, cooperative Eyes: extraocular movements intact, anicteric, conjunctiva pink Mouth: oral pharynx moist, no lesions Neck: supple no lymphadenopathy Cardiovascular: heart regular rate and rhythm, no murmur Lungs: clear to auscultation bilaterally Abdomen: soft, nontender, nondistended, no obvious ascites, no peritoneal signs, normal bowel sounds, no  organomegaly Rectal: Omitted Extremities: no clubbing, cyanosis, or lower extremity edema bilaterally Skin: no lesions on visible extremities Neuro: No focal deficits.  Cranial nerves intact   ASSESSMENT:   1.  Chronic cough.  Etiology unclear.  Primary pulmonary versus GERD versus medication.  Unfortunately, she has not seen improvement in her cough on high-dose acid suppressive therapy over the past few months.  If GERD related, it may take a bit longer.  Or, simply, GERD may be a concurrent but not causative problem.  She does have significant pulmonary history and pulmonary abnormalities on imaging.  As for medications, I am concerned about enalapril which has the relatively common side effect of cough. 2.  History of nonadvanced adenoma 2.  Aged out of surveillance 3.  Multiple medical problems     PLAN:   1.  Reflux precautions 2.  Continue high-dose acid suppressive therapy (pantoprazole 40 mg twice daily, Pepcid at night) for now.  Would continue for at least 4 months to give an adequate trial.  If no effect.  Then return to once daily pantoprazole, which controls her classic reflux symptoms.  She does continue with symptoms of regurgitation. 3.  Schedule upper endoscopy.  Rule out other entities provide dyspeptic complaints chronic cough.The nature of the procedure, as well as the risks, benefits, and alternatives were carefully and thoroughly reviewed with the patient. Ample time for discussion and questions allowed. The patient understood, was satisfied, and agreed to proceed. 4.  She should discuss with her PCP prospects of discontinuing enalapril, to see if this helps her chronic cough 5.  Ongoing pulmonary care with Dr. Judeth Horn

## 2023-05-27 NOTE — Progress Notes (Signed)
Report to PACU, RN, vss, BBS= Clear.  

## 2023-05-28 ENCOUNTER — Encounter (INDEPENDENT_AMBULATORY_CARE_PROVIDER_SITE_OTHER): Payer: Self-pay

## 2023-05-28 ENCOUNTER — Telehealth: Payer: Self-pay
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
## 2023-05-28 NOTE — Telephone Encounter (Signed)
  Follow up Call-     05/27/2023    9:11 AM 12/05/2022   10:10 AM  Call back number  Post procedure Call Back phone  # [phone]  Permission to leave phone message Yes Yes     Patient questions:  Do you have a fever, pain , or abdominal swelling? No. Pain Score  0 *  Have you tolerated food without any problems? Yes.    Have you been able to return to your normal activities? Yes.    Do you have any questions about your discharge instructions: Diet   No. Medications  No. Follow up visit  No.  Do you have questions or concerns about your Care? No.  Actions: * If pain score is 4 or above: No action needed, pain <4.

## 2023-06-02 ENCOUNTER — Telehealth: Payer: Self-pay | Admitting: Family Medicine

## 2023-06-02 ENCOUNTER — Ambulatory Visit: Payer: Medicare HMO | Admitting: Sports Medicine

## 2023-06-02 VITALS — BP 136/78 | HR 103 | Ht 60.0 in | Wt 150.0 lb

## 2023-06-02 DIAGNOSIS — G8929 Other chronic pain: Secondary | ICD-10-CM | POA: Diagnosis not present

## 2023-06-02 DIAGNOSIS — M1711 Unilateral primary osteoarthritis, right knee: Secondary | ICD-10-CM | POA: Diagnosis not present

## 2023-06-02 DIAGNOSIS — M25561 Pain in right knee: Secondary | ICD-10-CM

## 2023-06-02 DIAGNOSIS — M545 Low back pain, unspecified: Secondary | ICD-10-CM

## 2023-06-02 MED ORDER — METHYLPREDNISOLONE 4 MG PO TBPK: ORAL_TABLET | ORAL | 0 refills | Status: DC

## 2023-06-02 NOTE — Patient Instructions (Signed)
Discontinue meloxicam  Tylenol [phone] mg 2-3 times a day for pain relief ; Prednisone dos pak  Continue HEP back and knee 2 week follow up

## 2023-06-02 NOTE — Telephone Encounter (Signed)
Pt called and stated she's having a hard time with her bowels. I offered to schedule her an appointment but pt declined and requested to speak with nurse only. Please advise pt.

## 2023-06-03 DIAGNOSIS — L400 Psoriasis vulgaris: Secondary | ICD-10-CM | POA: Diagnosis not present

## 2023-06-03 NOTE — Telephone Encounter (Signed)
Sent pt mychart message

## 2023-06-04 DIAGNOSIS — D23122 Other benign neoplasm of skin of left lower eyelid, including canthus: Secondary | ICD-10-CM | POA: Diagnosis not present

## 2023-06-05 ENCOUNTER — Ambulatory Visit: Payer: Medicare HMO | Admitting: Family Medicine

## 2023-06-05 ENCOUNTER — Ambulatory Visit: Payer: Medicare HMO | Admitting: Sports Medicine

## 2023-06-06 ENCOUNTER — Telehealth: Payer: Self-pay

## 2023-06-06 NOTE — Telephone Encounter (Signed)
Patient states that she is down to the last three prednisone is still using Heat, rest, and tylenol as needed and is not getting any better. Patient cannot get comfortable laying down or sitting. She wants to know if there is anything that would help with that be it a brace or what not. Would like a call back

## 2023-06-09 NOTE — Telephone Encounter (Signed)
Patient called back this morning to follow up. Advised that Dr Jean Rosenthal was off in the afternoon on Friday and will be in this afternoon and we will follow up with her a soon as we can.

## 2023-06-10 ENCOUNTER — Other Ambulatory Visit: Payer: Self-pay | Admitting: Cardiology

## 2023-06-10 ENCOUNTER — Other Ambulatory Visit: Payer: Self-pay | Admitting: Sports Medicine

## 2023-06-10 MED ORDER — CYCLOBENZAPRINE HCL 5 MG PO TABS: 5.0000 mg | ORAL_TABLET | Freq: Every day | ORAL | 0 refills | Status: DC

## 2023-06-10 NOTE — Telephone Encounter (Signed)
Spoke with patient and discussed PT tylenol and ordering an MRI. She states she will call her insurance to see what they will cover and we will discuss at next appointment 8/19. We will also offer a muscle relaxer flexeril 5-10 mg nightly as needed

## 2023-06-10 NOTE — Telephone Encounter (Signed)
Pt called again for response.

## 2023-06-10 NOTE — Telephone Encounter (Signed)
Please note that patient was calling about her back pain ( visit 8/5 ). Her knees are better.   Was not sure if this changed your response.
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
## 2023-06-16 ENCOUNTER — Ambulatory Visit: Payer: Medicare HMO | Admitting: Sports Medicine

## 2023-06-16 VITALS — BP 138/70 | HR 96 | Ht 60.0 in | Wt 150.0 lb

## 2023-06-16 DIAGNOSIS — M5136 Other intervertebral disc degeneration, lumbar region: Secondary | ICD-10-CM | POA: Diagnosis not present

## 2023-06-16 DIAGNOSIS — M545 Low back pain, unspecified: Secondary | ICD-10-CM | POA: Diagnosis not present

## 2023-06-16 DIAGNOSIS — F40240 Claustrophobia: Secondary | ICD-10-CM

## 2023-06-16 DIAGNOSIS — G8929 Other chronic pain: Secondary | ICD-10-CM | POA: Diagnosis not present

## 2023-06-16 DIAGNOSIS — R32 Unspecified urinary incontinence: Secondary | ICD-10-CM | POA: Diagnosis not present

## 2023-06-16 MED ORDER — METHYLPREDNISOLONE ACETATE 80 MG/ML IJ SUSP
80.0000 mg | Freq: Once | INTRAMUSCULAR | Status: AC
Start: 2023-06-16 — End: 2023-06-16
  Administered 2023-06-16: 80 mg via INTRAMUSCULAR

## 2023-06-16 MED ORDER — KETOROLAC TROMETHAMINE 60 MG/2ML IM SOLN
60.0000 mg | Freq: Once | INTRAMUSCULAR | Status: AC
Start: 2023-06-16 — End: 2023-06-16
  Administered 2023-06-16: 60 mg via INTRAMUSCULAR

## 2023-06-16 MED ORDER — LORAZEPAM 0.5 MG PO TABS
ORAL_TABLET | ORAL | 0 refills | Status: DC
Start: 2023-06-16 — End: 2023-06-27

## 2023-06-16 NOTE — Patient Instructions (Addendum)
Good to see you   - Start Tylenol 500 to 1000 mg tablets 2-3 times a day for day-to-day pain relief -May use meloxicam 15 mg daily as needed for breakthrough pain if you are still in pain after using Tylenol - You may increase to Flexeril 10 mg nightly (take two tablets of 5 mg) as needed for muscle spasms - We gave you an injection of methylprednisone and Toradol today in clinic which should help provide relief for the next 24 to 48 hours - We have ordered a CT myelogram for your low back.  They should be contacting you within the next 1 week to schedule your visit - We prescribed Ativan to use for anxiety during your CT scan.  I would recommend taking 1 tablet 30 minutes before the scan and having the medication with you so that you can take another 1 tablet if needed for anxiety.  Call us when you schedule your CT scan and plan to follow up 4 days after CT scan

## 2023-06-17 ENCOUNTER — Encounter: Payer: Medicare HMO | Admitting: Family Medicine

## 2023-06-19 DIAGNOSIS — L814 Other melanin hyperpigmentation: Secondary | ICD-10-CM | POA: Diagnosis not present

## 2023-06-19 DIAGNOSIS — L821 Other seborrheic keratosis: Secondary | ICD-10-CM | POA: Diagnosis not present

## 2023-06-19 DIAGNOSIS — D225 Melanocytic nevi of trunk: Secondary | ICD-10-CM | POA: Diagnosis not present

## 2023-06-19 DIAGNOSIS — L400 Psoriasis vulgaris: Secondary | ICD-10-CM | POA: Diagnosis not present

## 2023-06-19 DIAGNOSIS — K120 Recurrent oral aphthae: Secondary | ICD-10-CM | POA: Diagnosis not present

## 2023-06-24 NOTE — Discharge Instructions (Signed)

## 2023-06-25 ENCOUNTER — Ambulatory Visit
Admission: RE | Admit: 2023-06-25 | Discharge: 2023-06-25 | Disposition: A | Discharge status: Home / Self Care | Payer: Medicare HMO | Source: Ambulatory Visit | Attending: Sports Medicine | Admitting: Sports Medicine

## 2023-06-25 ENCOUNTER — Ambulatory Visit: Payer: Medicare HMO | Admitting: Sports Medicine

## 2023-06-25 VITALS — BP 136/78 | HR 93 | Ht 60.0 in | Wt 150.0 lb

## 2023-06-25 DIAGNOSIS — G8929 Other chronic pain: Secondary | ICD-10-CM

## 2023-06-25 DIAGNOSIS — M5126 Other intervertebral disc displacement, lumbar region: Secondary | ICD-10-CM

## 2023-06-25 DIAGNOSIS — M4696 Unspecified inflammatory spondylopathy, lumbar region: Secondary | ICD-10-CM | POA: Diagnosis not present

## 2023-06-25 DIAGNOSIS — M545 Low back pain, unspecified: Secondary | ICD-10-CM | POA: Diagnosis not present

## 2023-06-25 DIAGNOSIS — M5136 Other intervertebral disc degeneration, lumbar region: Secondary | ICD-10-CM

## 2023-06-25 DIAGNOSIS — R32 Unspecified urinary incontinence: Secondary | ICD-10-CM

## 2023-06-25 MED ORDER — ONDANSETRON HCL 4 MG/2ML IJ SOLN: 4.0000 mg | Freq: Once | INTRAMUSCULAR | Status: DC | PRN

## 2023-06-25 MED ORDER — MEPERIDINE HCL 50 MG/ML IJ SOLN: 50.0000 mg | Freq: Once | INTRAMUSCULAR | Status: DC | PRN

## 2023-06-25 MED ORDER — IOPAMIDOL (ISOVUE-M 200) INJECTION 41%
20.0000 mL | Freq: Once | INTRAMUSCULAR | Status: AC
  Administered 2023-06-25: 20 mL via INTRATHECAL

## 2023-06-25 MED ORDER — DIAZEPAM 5 MG PO TABS: 5.0000 mg | ORAL_TABLET | Freq: Once | ORAL | Status: DC
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
## 2023-06-25 NOTE — Patient Instructions (Signed)
Epidural left L4-5 Follow up 2 weeks after to discuss results

## 2023-06-26 ENCOUNTER — Ambulatory Visit: Payer: Medicare HMO | Admitting: Pulmonary Disease

## 2023-06-27 ENCOUNTER — Ambulatory Visit: Payer: Medicare HMO | Admitting: Podiatry

## 2023-06-27 ENCOUNTER — Encounter: Payer: Self-pay | Admitting: Family Medicine

## 2023-06-27 ENCOUNTER — Ambulatory Visit (INDEPENDENT_AMBULATORY_CARE_PROVIDER_SITE_OTHER): Payer: Medicare HMO | Admitting: Family Medicine

## 2023-06-27 VITALS — BP 115/70 | HR 95 | Ht 60.0 in | Wt 148.0 lb

## 2023-06-27 DIAGNOSIS — E785 Hyperlipidemia, unspecified: Secondary | ICD-10-CM | POA: Diagnosis not present

## 2023-06-27 DIAGNOSIS — E1169 Type 2 diabetes mellitus with other specified complication: Secondary | ICD-10-CM

## 2023-06-27 DIAGNOSIS — I10 Essential (primary) hypertension: Secondary | ICD-10-CM

## 2023-06-27 DIAGNOSIS — E559 Vitamin D deficiency, unspecified: Secondary | ICD-10-CM

## 2023-06-27 DIAGNOSIS — E1165 Type 2 diabetes mellitus with hyperglycemia: Secondary | ICD-10-CM | POA: Diagnosis not present

## 2023-06-27 DIAGNOSIS — Z0000 Encounter for general adult medical examination without abnormal findings: Secondary | ICD-10-CM | POA: Diagnosis not present

## 2023-06-27 LAB — COMPREHENSIVE METABOLIC PANEL
ALT: 16 U/L (ref 0–35)
AST: 18 U/L (ref 0–37)
Albumin: 3.9 g/dL (ref 3.5–5.2)
Alkaline Phosphatase: 56 U/L (ref 39–117)
BUN: 11 mg/dL (ref 6–23)
CO2: 30 mEq/L (ref 19–32)
Calcium: 9.8 mg/dL (ref 8.4–10.5)
Chloride: 104 mEq/L (ref 96–112)
Creatinine, Ser: 0.89 mg/dL (ref 0.40–1.20)
GFR: 62.15 mL/min (ref >60.00)
Glucose, Bld: 96 mg/dL (ref 70–99)
Potassium: 3.9 mEq/L (ref 3.5–5.1)
Sodium: 144 mEq/L (ref 135–145)
Total Bilirubin: 0.4 mg/dL (ref 0.2–1.2)
Total Protein: 6.9 g/dL (ref 6.0–8.3)

## 2023-06-27 LAB — LIPID PANEL
Cholesterol: 163 mg/dL (ref 0–200)
HDL: 42 mg/dL
LDL Cholesterol: 90 mg/dL (ref 0–99)
NonHDL: 120.55
Total CHOL/HDL Ratio: 4
Triglycerides: 153 mg/dL — ABNORMAL HIGH (ref 0.0–149.0)
VLDL: 30.6 mg/dL (ref 0.0–40.0)

## 2023-06-27 LAB — VITAMIN D 25 HYDROXY (VIT D DEFICIENCY, FRACTURES): VITD: 74.4 ng/mL (ref 30.00–100.00)

## 2023-06-27 LAB — HEMOGLOBIN A1C: Hgb A1c MFr Bld: 6.9 % — ABNORMAL HIGH (ref 4.6–6.5)

## 2023-06-27 NOTE — Progress Notes (Signed)
2  Complete physical exam  [name]   DOB: 05-Sep-1946   77 y.o. Female  MRN: [id number]  Subjective:    Chief Complaint  Patient presents with   Annual Exam    Heidi Franklin is a 77 y.o. female who presents today for a complete physical exam. She reports consuming a general diet. The patient does not participate in regular exercise at present. She generally feels fairly well. She reports sleeping fairly well. She does not have additional problems to discuss today.   Currently lives with: husband Acute concerns or interim problems since last visit: back has been flaring up, she is following with Dr. Jean Rosenthal (Sports Med)  Vision concerns: no Dental concerns: no   Patient denies ETOH use. Patient denies nicotine use. Patient denies illegal substance use.        Most recent fall risk assessment:    06/27/2023   10:44 AM  Fall Risk   Falls in the past year? 1  Number falls in past yr: 0  Injury with Fall? 0  Risk for fall due to : History of fall(s)  Follow up Falls evaluation completed     Most recent depression screenings:    06/27/2023   10:44 AM 05/05/2023    9:04 AM  PHQ 2/9 Scores  PHQ - 2 Score 0 0            Patient Care Team: Bradd Canary, MD as PCP - General (Family Medicine) Coralyn Helling, MD (Pulmonary Disease) Tuchman, Fanny Bien, DPM (Inactive) (Podiatry) Lunette Stands, MD (Orthopedic Surgery) Hilarie Fredrickson, MD (Gastroenterology) Huston Foley, MD (Neurology)   Outpatient Medications Prior to Visit  Medication Sig   acetaminophen (TYLENOL) 325 MG tablet Take 650 mg by mouth every 6 (six) hours as needed for mild pain or fever.   albuterol (VENTOLIN HFA) 108 (90 Base) MCG/ACT inhaler INHALE 1-2 PUFFS INTO THE LUNGS EVERY 6 (SIX) HOURS AS NEEDED FOR WHEEZING OR SHORTNESS OF BREATH.   ALPRAZolam (XANAX) 0.5 MG tablet TAKE 1 TABLET BY MOUTH TWICE A DAY AS NEEDED FOR ANXIETY   amLODipine (NORVASC) 10 MG tablet TAKE 1 TABLET BY MOUTH  EVERY DAY   aspirin 81 MG chewable tablet Chew 81 mg by mouth daily.   Baclofen 5 MG TABS Take 1 tablet (5 mg total) by mouth every 12 (twelve) hours as needed (muscle pain or spasm).   betamethasone dipropionate (DIPROLENE) 0.05 % ointment Apply 1 application  topically 2 (two) times daily.   budesonide-formoterol (SYMBICORT) 160-4.5 MCG/ACT inhaler Inhale 2 puffs into the lungs 2 (two) times daily.   Cholecalciferol (VITAMIN D3) 50 MCG (2000 UT) capsule Take 2,000 Units by mouth daily.   erythromycin ophthalmic ointment Place 1 Application into the left eye 2 (two) times daily.   famotidine (PEPCID) 40 MG tablet Take 1 tablet (40 mg total) by mouth daily.   fluticasone (CUTIVATE) 0.05 % cream Apply 1 Application topically 2 (two) times daily.   fluticasone (FLONASE) 50 MCG/ACT nasal spray SPRAY 2 SPRAYS INTO EACH NOSTRIL EVERY DAY (Patient taking differently: Place 2 sprays into both nostrils daily.)   glimepiride (AMARYL) 1 MG tablet TAKE 1 TABLET BY MOUTH EVERY DAY WITH BREAKFAST   glucose blood (ONE TOUCH ULTRA TEST) test strip 1 each by Other route daily. Use 1 strips to check blood sugar twice a day Ex E11.9   HUMIRA PEN-PS/UV/ADOL HS START 40 MG/0.8ML PNKT Inject 1 Dose into the skin every 14 (fourteen) days. Every  2 weeks   HYDROcodone-acetaminophen (NORCO/VICODIN) 5-325 MG tablet Take 1 tablet by mouth every 6 (six) hours as needed (pain).   isosorbide mononitrate (IMDUR) 30 MG 24 hr tablet Take 1 tablet (30 mg total) by mouth daily.   ketoconazole (NIZORAL) 2 % cream Apply 1 Application topically daily as needed for irritation.   Lancets (ONETOUCH ULTRASOFT) lancets PRN (Patient taking differently: 1 each by Other route as needed for other (glucose check). PRN)   metoprolol succinate (TOPROL-XL) 50 MG 24 hr tablet Take 1 tablet (50 mg total) by mouth daily.   mometasone (ELOCON) 0.1 % ointment Apply 1 application  topically daily.   montelukast (SINGULAIR) 10 MG tablet Take 1 tablet (10  mg total) by mouth at bedtime.   Multiple Vitamins-Minerals (CENTRUM SILVER PO) Take 1 tablet by mouth every morning.   pantoprazole (PROTONIX) 40 MG tablet Take 1 tablet (40 mg total) by mouth 2 (two) times daily.   Potassium Chloride ER 20 MEQ TBCR Take 1 tablet (20 mEq total) by mouth daily. TAKE 1 TABLET BY MOUTH EVERY DAY   rosuvastatin (CRESTOR) 20 MG tablet Take 0.5 tablets (10 mg total) by mouth daily.   triamcinolone cream (KENALOG) 0.1 % Apply 1 Application topically 2 (two) times daily as needed (rash).   valACYclovir (VALTREX) 500 MG tablet Take 500 mg by mouth 2 (two) times daily.   nitroGLYCERIN (NITROSTAT) 0.4 MG SL tablet Place 1 tablet (0.4 mg total) under the tongue every 5 (five) minutes as needed. (Patient taking differently: Place 0.4 mg under the tongue every 5 (five) minutes as needed for chest pain.)   [DISCONTINUED] Blood Pressure Monitoring (ADULT BLOOD PRESSURE CUFF LG) KIT Check blood pressure as needed. (Patient taking differently: 1 each by Other route as needed (Check blood pressure). Check blood pressure as needed.)   [DISCONTINUED] cyclobenzaprine (FLEXERIL) 5 MG tablet Take 1 tablet (5 mg total) by mouth at bedtime.   [DISCONTINUED] enalapril (VASOTEC) 2.5 MG tablet Take 1 tablet (2.5 mg total) by mouth daily.   [DISCONTINUED] levocetirizine (XYZAL) 5 MG tablet TAKE 1 TABLET BY MOUTH EVERY DAY IN THE EVENING (Patient taking differently: Take 5 mg by mouth every evening.)   [DISCONTINUED] LORazepam (ATIVAN) 0.5 MG tablet 1-2 tabs 30 - 60 min prior to MRI. Do not drive with this medicine.   [DISCONTINUED] meloxicam (MOBIC) 15 MG tablet Take 1 tablet (15 mg total) by mouth daily.   [DISCONTINUED] methylPREDNISolone (MEDROL DOSEPAK) 4 MG TBPK tablet Take 6 tablets on day 1.  Take 5 tablets on day 2.  Take 4 tablets on day 3.  Take 3 tablets on day 4.  Take 2 tablets on day 5.  Take 1 tablet on day 6.   [DISCONTINUED] nitroGLYCERIN (NITRO-DUR) 0.4 mg/hr patch Place 1 patch  (0.4 mg total) onto the skin daily. Place on achilles tendon once a day-   [DISCONTINUED] promethazine-dextromethorphan (PROMETHAZINE-DM) 6.25-15 MG/5ML syrup Take 5 mLs by mouth 2 (two) times daily as needed for cough (during day).   No facility-administered medications prior to visit.    ROS All review of systems negative except what is listed in the HPI        Objective:     BP 115/70   Pulse 95   Ht 5' (1.524 m)   Wt 148 lb (67.1 kg)   SpO2 100%   BMI 28.90 kg/m    Physical Exam Vitals reviewed.  Constitutional:      General: She is not in acute distress.  Appearance: Normal appearance. She is not ill-appearing.  HENT:     Head: Normocephalic and atraumatic.     Right Ear: Tympanic membrane normal.     Left Ear: Tympanic membrane normal.     Nose: Nose normal.     Mouth/Throat:     Mouth: Mucous membranes are moist.     Pharynx: Oropharynx is clear.  Eyes:     Extraocular Movements: Extraocular movements intact.     Conjunctiva/sclera: Conjunctivae normal.     Pupils: Pupils are equal, round, and reactive to light.  Neck:     Vascular: No carotid bruit.  Cardiovascular:     Rate and Rhythm: Normal rate and regular rhythm.     Pulses: Normal pulses.     Heart sounds: Normal heart sounds.  Pulmonary:     Effort: Pulmonary effort is normal.     Breath sounds: Normal breath sounds.  Abdominal:     General: Abdomen is flat. Bowel sounds are normal. There is no distension.     Palpations: Abdomen is soft. There is no mass.     Tenderness: There is no abdominal tenderness. There is no right CVA tenderness, left CVA tenderness, guarding or rebound.  Genitourinary:    Comments: Deferred exam Musculoskeletal:        General: Normal range of motion.     Cervical back: Normal range of motion and neck supple. No tenderness.     Right lower leg: No edema.     Left lower leg: No edema.  Lymphadenopathy:     Cervical: No cervical adenopathy.  Skin:    General:  Skin is warm and dry.     Capillary Refill: Capillary refill takes less than 2 seconds.  Neurological:     General: No focal deficit present.     Mental Status: She is alert and oriented to person, place, and time. Mental status is at baseline.  Psychiatric:        Mood and Affect: Mood normal.        Behavior: Behavior normal.        Thought Content: Thought content normal.        Judgment: Judgment normal.    Diabetic foot exam was performed.  No deformities or other abnormal visual findings.  Posterior tibialis and dorsalis pulse intact bilaterally.  Intact to touch and monofilament testing bilaterally.    No results found for any visits on 06/27/23.     Assessment & Plan:    Routine Health Maintenance and Physical Exam Discussed health promotion and safety including diet and exercise recommendations, dental health, and injury prevention. Tobacco cessation if applicable. Seat belts, sunscreen, smoke detectors, etc.    Immunization History  Administered Date(s) Administered   Fluad Quad(high Dose 65+) 06/25/2019, 07/14/2020, 07/17/2021   Influenza Split 08/20/2011, 07/30/2012, 06/28/2014   Influenza Whole 07/28/2009, 07/06/2010   Influenza, High Dose Seasonal PF 06/19/2018, 06/18/2022   Influenza, Seasonal, Injecte, Preservative Fre 06/25/2019   Influenza,inj,Quad PF,6+ Mos 06/23/2013, 07/05/2015, 06/20/2016, 06/23/2017   Influenza-Unspecified 06/28/2014   Moderna Covid-19 Vaccine Bivalent Booster 45yrs & up 09/07/2021   Moderna Sars-Covid-2 Vaccination 11/27/2019, 12/27/2019, 09/12/2020   Pneumococcal Conjugate-13 06/15/2015   Pneumococcal Polysaccharide-23 10/02/2011   Td 11/23/2013   Zoster Recombinant(Shingrix) 01/22/2022, 06/18/2022    Health Maintenance  Topic Date Due   OPHTHALMOLOGY EXAM  10/17/2022   FOOT EXAM  04/23/2023   INFLUENZA VACCINE  05/29/2023   COVID-19 Vaccine (5 - 2023-24 season) 06/25/2024 (Originally 06/28/2022)   HEMOGLOBIN A1C  08/12/2023    DTaP/Tdap/Td (2 - Tdap) 11/24/2023   Diabetic kidney evaluation - Urine ACR  02/10/2024   Diabetic kidney evaluation - eGFR measurement  02/19/2024   Medicare Annual Wellness (AWV)  05/04/2024   Pneumonia Vaccine 92+ Years old  Completed   DEXA SCAN  Completed   Hepatitis C Screening  Completed   Zoster Vaccines- Shingrix  Completed   HPV VACCINES  Aged Out   Colonoscopy  Discontinued        Problem List Items Addressed This Visit       Active Problems   Hyperlipidemia associated with type 2 diabetes mellitus (HCC)   Relevant Orders   Lipid panel   Essential hypertension   Relevant Orders   Comprehensive metabolic panel   Vitamin D deficiency   Relevant Orders   Vitamin D (25 hydroxy)   Type 2 diabetes mellitus with hyperglycemia (HCC)   Relevant Orders   HgB A1c   Other Visit Diagnoses     Annual physical exam    -  Primary   Relevant Orders   HgB A1c   Comprehensive metabolic panel   Lipid panel   Vitamin D (25 hydroxy)      Return in about 6 months (around 12/26/2023) for routine follow-up.     Clayborne Dana, NP

## 2023-06-30 ENCOUNTER — Encounter: Payer: Self-pay | Admitting: Family Medicine

## 2023-07-02 ENCOUNTER — Other Ambulatory Visit: Payer: Self-pay | Admitting: Family Medicine

## 2023-07-03 ENCOUNTER — Ambulatory Visit: Payer: Medicare HMO | Admitting: Gastroenterology

## 2023-07-04 ENCOUNTER — Ambulatory Visit: Payer: Medicare HMO | Admitting: Podiatry

## 2023-07-09 NOTE — Discharge Instructions (Signed)
Post Procedure Spinal Discharge Instruction Sheet  You may resume a regular diet and any medications that you routinely take (including pain medications) unless otherwise noted by MD.  No driving day of procedure.  Light activity throughout the rest of the day.  Do not do any strenuous work, exercise, bending or lifting.  The day following the procedure, you can resume normal physical activity but you should refrain from exercising or physical therapy for at least three days thereafter.  You may apply ice to the injection site, 20 minutes on, 20 minutes off, as needed. Do not apply ice directly to skin.    Common Side Effects:  Headaches- take your usual medications as directed by your physician.  Increase your fluid intake.  Caffeinated beverages may be helpful.  Lie flat in bed until your headache resolves.  Restlessness or inability to sleep- you may have trouble sleeping for the next few days.  Ask your referring physician if you need any medication for sleep.  Facial flushing or redness- should subside within a few days.  Increased pain- a temporary increase in pain a day or two following your procedure is not unusual.  Take your pain medication as prescribed by your referring physician.  Leg cramps  Please contact our office at [phone] for the following symptoms: Fever greater than 100 degrees. Headaches unresolved with medication after 2-3 days. Increased swelling, pain, or redness at injection site.   Thank you for visiting [facility] today.    YOU MAY RESUME YOUR ASPIRIN TODAY, POST PROCEDURE.

## 2023-07-10 ENCOUNTER — Ambulatory Visit
Admission: RE | Admit: 2023-07-10 | Discharge: 2023-07-10 | Disposition: A | Discharge status: Home / Self Care | Payer: Medicare HMO | Source: Ambulatory Visit | Attending: Sports Medicine | Admitting: Sports Medicine

## 2023-07-10 DIAGNOSIS — M5136 Other intervertebral disc degeneration, lumbar region: Secondary | ICD-10-CM | POA: Diagnosis not present

## 2023-07-10 DIAGNOSIS — M47817 Spondylosis without myelopathy or radiculopathy, lumbosacral region: Secondary | ICD-10-CM | POA: Diagnosis not present

## 2023-07-10 DIAGNOSIS — M545 Low back pain, unspecified: Secondary | ICD-10-CM | POA: Diagnosis not present

## 2023-07-10 DIAGNOSIS — M5126 Other intervertebral disc displacement, lumbar region: Secondary | ICD-10-CM | POA: Diagnosis not present

## 2023-07-10 MED ORDER — IOPAMIDOL (ISOVUE-M 200) INJECTION 41%
1.0000 mL | Freq: Once | INTRAMUSCULAR | Status: AC
  Administered 2023-07-10: 1 mL via EPIDURAL

## 2023-07-10 MED ORDER — METHYLPREDNISOLONE ACETATE 40 MG/ML INJ SUSP (RADIOLOG
80.0000 mg | Freq: Once | INTRAMUSCULAR | Status: AC
  Administered 2023-07-10: 80 mg via EPIDURAL

## 2023-07-14 DIAGNOSIS — L400 Psoriasis vulgaris: Secondary | ICD-10-CM | POA: Diagnosis not present

## 2023-07-23 ENCOUNTER — Telehealth: Payer: Self-pay | Admitting: Sports Medicine

## 2023-07-23 NOTE — Telephone Encounter (Signed)
Appointment scheduled.

## 2023-07-23 NOTE — Telephone Encounter (Signed)
Patient called stating that she had an epidural on 07/10/23 but at this point has not had any relief.  She asked if there was something else she could try?  Please advise.

## 2023-07-24 ENCOUNTER — Ambulatory Visit: Payer: Medicare HMO | Admitting: Sports Medicine

## 2023-07-24 VITALS — BP 132/78 | HR 83 | Ht 60.0 in | Wt 147.0 lb

## 2023-07-24 DIAGNOSIS — M5126 Other intervertebral disc displacement, lumbar region: Secondary | ICD-10-CM | POA: Diagnosis not present

## 2023-07-24 DIAGNOSIS — M5136 Other intervertebral disc degeneration, lumbar region: Secondary | ICD-10-CM | POA: Diagnosis not present

## 2023-07-24 DIAGNOSIS — G8929 Other chronic pain: Secondary | ICD-10-CM

## 2023-07-24 DIAGNOSIS — M545 Low back pain, unspecified: Secondary | ICD-10-CM

## 2023-07-24 NOTE — Patient Instructions (Signed)
Tylenol [phone] mg 2-3 times a day for pain relief  Recommend getting over the counter lidocaine patches Call us if you decide to proceed with facet injections As needed follow up
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
## 2023-07-28 ENCOUNTER — Encounter: Payer: Self-pay | Admitting: Sports Medicine

## 2023-08-01 ENCOUNTER — Encounter: Payer: Self-pay | Admitting: Family Medicine

## 2023-08-04 ENCOUNTER — Other Ambulatory Visit: Payer: Self-pay | Admitting: Sports Medicine

## 2023-08-04 DIAGNOSIS — M5136 Other intervertebral disc degeneration, lumbar region with discogenic back pain only: Secondary | ICD-10-CM

## 2023-08-04 DIAGNOSIS — M5126 Other intervertebral disc displacement, lumbar region: Secondary | ICD-10-CM

## 2023-08-04 DIAGNOSIS — G8929 Other chronic pain: Secondary | ICD-10-CM

## 2023-08-04 NOTE — Telephone Encounter (Signed)
Referral placed to neurosurgery

## 2023-08-04 NOTE — Progress Notes (Unsigned)
Referral placed today

## 2023-08-07 ENCOUNTER — Ambulatory Visit: Payer: Medicare HMO | Admitting: Pulmonary Disease
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
## 2023-08-11 ENCOUNTER — Encounter: Payer: Self-pay | Admitting: Family Medicine

## 2023-08-11 ENCOUNTER — Telehealth: Payer: Self-pay

## 2023-08-11 ENCOUNTER — Ambulatory Visit (INDEPENDENT_AMBULATORY_CARE_PROVIDER_SITE_OTHER): Payer: Medicare HMO | Admitting: Family Medicine

## 2023-08-11 VITALS — BP 104/62 | HR 82 | Ht 60.0 in | Wt 149.0 lb

## 2023-08-11 DIAGNOSIS — E1165 Type 2 diabetes mellitus with hyperglycemia: Secondary | ICD-10-CM

## 2023-08-11 DIAGNOSIS — Z7984 Long term (current) use of oral hypoglycemic drugs: Secondary | ICD-10-CM | POA: Diagnosis not present

## 2023-08-11 DIAGNOSIS — E1065 Type 1 diabetes mellitus with hyperglycemia: Secondary | ICD-10-CM | POA: Diagnosis not present

## 2023-08-11 LAB — GLUCOSE, POCT (MANUAL RESULT ENTRY): POC Glucose: 106 mg/dL — AB (ref 70–99)

## 2023-08-11 NOTE — Progress Notes (Signed)
[facility] [address] [place], [place] [address] [phone]  Date:  08/11/2023   Name:  Heidi Franklin   DOB:  1946/07/26   MRN:  [id number]  PCP:  Bradd Canary, MD    Chief Complaint: Hyperglycemia   History of Present Illness:  Heidi Franklin is a 77 y.o. very pleasant female patient who presents with the following:  Pt of my partner Dr Abner Greenspan seen today with concern of very high blood sugar History of diabetes She was seen in August by Hyman Hopes, NP for her physical-at that time her A1c showed good control of blood sugars  Lab Results  Component Value Date   HGBA1C 6.9 (H) 06/27/2023   Looking back, her A1c has not been over 7%  It looks like she recently had an epidural steroid injection which may be causing her sugar to go up-injection done on 9/12.  She called earlier today with concern of a blood glucose of 412 yesterday- today her blood sugar was was 279 Last week she notes her blood sugars in the 300s She is taking amaryl 1 mg daily - she doubled it for a few days (on advice of [employer]) but she did not keep this up, went back to just 1 mg   She is trying to drink more water to hydrate and as such is urinating more Vision is normal, no other symptoms of high blood sugar such as weight loss  On further discussion Heidi Franklin does mention that her meter has been behaving a little bit strangely.  It has sometimes told her that her stroke was already used when she knows it is new She is ordered a new meter but has not arrived just yet Wt Readings from Last 3 Encounters:  08/11/23 149 lb (67.6 kg)  07/24/23 147 lb (66.7 kg)  06/27/23 148 lb (67.1 kg)   Flu shot, COVID booster already done Patient Active Problem List   Diagnosis Date Noted   Coronary artery disease no hemodynamically significant stenosis based on coronary CT angio from March 2023 10/01/2022   Positive FIT (fecal immunochemical test)  09/25/2022   ALLERGIC RHINITIS 11/15/2021   Arthritis 11/15/2021   Asthma 11/15/2021   BRONCHITIS, CHRONIC 11/15/2021   Change in bowel habits 10/18/2021   Type 2 diabetes mellitus with hyperglycemia (HCC) 09/24/2021   Acute bilateral knee pain 09/24/2021   Vitiligo 09/07/2021   Pedal edema 03/27/2020   Body mass index (BMI) 31.0-31.9, adult 11/08/2019   Disc displacement, lumbar 11/08/2019   Strain of lumbar paraspinal muscle 11/08/2019   History of chicken pox 04/11/2019   Vitamin D deficiency 04/05/2019   Lumbar radiculopathy 03/31/2019   Neck pain 07/06/2018   Trapezius muscle strain, right, initial encounter 06/23/2018   Edema 06/23/2018   Gout 04/08/2018   Herpes simplex 02/10/2018   ETD (eustachian tube dysfunction) 02/10/2018   Peroneal tendinitis, right 01/06/2018   Psoriasis 11/14/2017   Acute bursitis of right shoulder 09/29/2017   Bilateral hip pain 07/10/2017   Bronchiectasis without complication (HCC) 06/25/2017   Irritable larynx syndrome 04/25/2017   Reflux laryngitis 04/23/2017   Benign lipomatous neoplasm of skin and subcutaneous tissue of right leg 02/25/2017   Anemia 02/05/2017   Fatigue 01/23/2017   Cough 01/02/2017   Multiple pulmonary nodules 11/10/2016   Chronic low back pain 10/09/2016   Degenerative arthritis of right knee 06/20/2016   Degenerative arthritis of left knee 04/10/2016  Peroneal tendinitis of left lower extremity 09/05/2015   Recurrent falls 08/08/2015   Bursitis of left shoulder 07/13/2015   Morbid obesity due to excess calories (HCC) 04/16/2015   Atypical chest pain 05/11/2011   Nonspecific abnormal electrocardiogram (ECG) (EKG) 05/11/2011   Hyperlipidemia associated with type 2 diabetes mellitus (HCC) 03/02/2010   Depression with anxiety 03/02/2010   Essential hypertension 03/02/2010   Chronic rhinitis 03/02/2010   Upper airway cough syndrome 03/02/2010   GERD 03/02/2010    Past Medical History:  Diagnosis Date   ALLERGIC  RHINITIS    Allergy    Anxiety state, unspecified    Arthritis    Asthma    BRONCHITIS, CHRONIC    Cataract    b/l   Chronic low back pain 10/09/2016   COPD (chronic obstructive pulmonary disease) (HCC)    DEPRESSION    DIABETES MELLITUS, TYPE II    GERD    HYPERLIPIDEMIA    HYPERTENSION    Pneumonia    Psoriasis     Past Surgical History:  Procedure Laterality Date   ABDOMINAL HYSTERECTOMY  1980   CARPAL TUNNEL RELEASE Right    COLONOSCOPY     TRIGGER FINGER RELEASE Left    Index finger   UPPER GASTROINTESTINAL ENDOSCOPY      Social History   Tobacco Use   Smoking status: Never    Passive exposure: Never   Smokeless tobacco: Never  Vaping Use   Vaping status: Never Used  Substance Use Topics   Alcohol use: No    Alcohol/week: 0.0 standard drinks of alcohol   Drug use: No    Family History  Problem Relation Age of Onset   Stroke Mother    Angina Mother    Hyperlipidemia Mother    Hypertension Mother    Heart disease Mother    Depression Mother    Diabetes Father    Hyperlipidemia Other        Parent   Hypertension Other        Parent   Diabetes Sister        x1   CAD Sister    COPD Sister        smoker   Hyperlipidemia Sister    Hypertension Sister    Diabetes Brother        x 2   Hyperlipidemia Brother    Hypertension Brother    Diabetes Brother    Hyperlipidemia Brother    Hypertension Brother    CAD Sister    Arthritis Sister    Hyperlipidemia Sister    Hypertension Sister    Colon cancer Neg Hx    Esophageal cancer Neg Hx    Rectal cancer Neg Hx    Stomach cancer Neg Hx    Pancreatic cancer Neg Hx     Allergies  Allergen Reactions   Metformin And Related Diarrhea   Penicillins Rash    Medication list has been reviewed and updated.  Current Outpatient Medications on File Prior to Visit  Medication Sig Dispense Refill   acetaminophen (TYLENOL) 325 MG tablet Take 650 mg by mouth every 6 (six) hours as needed for mild pain or  fever.     albuterol (VENTOLIN HFA) 108 (90 Base) MCG/ACT inhaler INHALE 1-2 PUFFS INTO THE LUNGS EVERY 6 (SIX) HOURS AS NEEDED FOR WHEEZING OR SHORTNESS OF BREATH. 8.5 each 3   ALPRAZolam (XANAX) 0.5 MG tablet TAKE 1 TABLET BY MOUTH TWICE A DAY AS NEEDED FOR ANXIETY 60 tablet 1  amLODipine (NORVASC) 10 MG tablet TAKE 1 TABLET BY MOUTH EVERY DAY 90 tablet 0   aspirin 81 MG chewable tablet Chew 81 mg by mouth daily.     Baclofen 5 MG TABS Take 1 tablet (5 mg total) by mouth every 12 (twelve) hours as needed (muscle pain or spasm). 10 tablet 0   betamethasone dipropionate (DIPROLENE) 0.05 % ointment Apply 1 application  topically 2 (two) times daily.     budesonide-formoterol (SYMBICORT) 160-4.5 MCG/ACT inhaler Inhale 2 puffs into the lungs 2 (two) times daily. 1 each 1   Cholecalciferol (VITAMIN D3) 50 MCG (2000 UT) capsule Take 2,000 Units by mouth daily.     erythromycin ophthalmic ointment Place 1 Application into the left eye 2 (two) times daily.     famotidine (PEPCID) 40 MG tablet Take 1 tablet (40 mg total) by mouth daily. 90 tablet 0   fluticasone (CUTIVATE) 0.05 % cream Apply 1 Application topically 2 (two) times daily.     fluticasone (FLONASE) 50 MCG/ACT nasal spray SPRAY 2 SPRAYS INTO EACH NOSTRIL EVERY DAY (Patient taking differently: Place 2 sprays into both nostrils daily.) 48 mL 2   glimepiride (AMARYL) 1 MG tablet TAKE 1 TABLET BY MOUTH EVERY DAY WITH BREAKFAST 90 tablet 1   glucose blood (ONE TOUCH ULTRA TEST) test strip 1 each by Other route daily. Use 1 strips to check blood sugar twice a day Ex E11.9 100 each 3   HUMIRA PEN-PS/UV/ADOL HS START 40 MG/0.8ML PNKT Inject 1 Dose into the skin every 14 (fourteen) days. Every 2 weeks     HYDROcodone-acetaminophen (NORCO/VICODIN) 5-325 MG tablet Take 1 tablet by mouth every 6 (six) hours as needed (pain). 12 tablet 0   isosorbide mononitrate (IMDUR) 30 MG 24 hr tablet Take 1 tablet (30 mg total) by mouth daily. 90 tablet 3    ketoconazole (NIZORAL) 2 % cream Apply 1 Application topically daily as needed for irritation.     Lancets (ONETOUCH ULTRASOFT) lancets PRN (Patient taking differently: 1 each by Other route as needed for other (glucose check). PRN) 100 each 3   metoprolol succinate (TOPROL-XL) 50 MG 24 hr tablet Take 1 tablet (50 mg total) by mouth daily. 90 tablet 0   mometasone (ELOCON) 0.1 % ointment Apply 1 application  topically daily.     montelukast (SINGULAIR) 10 MG tablet TAKE 1 TABLET BY MOUTH EVERYDAY AT BEDTIME 90 tablet 1   Multiple Vitamins-Minerals (CENTRUM SILVER PO) Take 1 tablet by mouth every morning.     pantoprazole (PROTONIX) 40 MG tablet Take 1 tablet (40 mg total) by mouth 2 (two) times daily. 60 tablet 6   Potassium Chloride ER 20 MEQ TBCR Take 1 tablet (20 mEq total) by mouth daily. TAKE 1 TABLET BY MOUTH EVERY DAY 90 tablet 0   rosuvastatin (CRESTOR) 20 MG tablet Take 0.5 tablets (10 mg total) by mouth daily. 45 tablet 2   triamcinolone cream (KENALOG) 0.1 % Apply 1 Application topically 2 (two) times daily as needed (rash).     valACYclovir (VALTREX) 500 MG tablet Take 500 mg by mouth 2 (two) times daily.  0   nitroGLYCERIN (NITROSTAT) 0.4 MG SL tablet Place 1 tablet (0.4 mg total) under the tongue every 5 (five) minutes as needed. (Patient taking differently: Place 0.4 mg under the tongue every 5 (five) minutes as needed for chest pain.) 30 tablet 3   No current facility-administered medications on file prior to visit.    Review of Systems:  As per  HPI- otherwise negative.   Physical Examination: Vitals:   08/11/23 1501  BP: 104/62  Pulse: 82  SpO2: 97%   Vitals:   08/11/23 1501  Weight: 149 lb (67.6 kg)  Height: 5' (1.524 m)   Body mass index is 29.1 kg/m. Ideal Body Weight: Weight in (lb) to have BMI = 25: 127.7  BP Readings from Last 3 Encounters:  08/11/23 104/62  07/24/23 132/78  07/10/23 121/63    GEN: no acute distress.  Overweight, looks well  HEENT:  Atraumatic, Normocephalic.  Ears and Nose: No external deformity. CV: RRR, No M/G/R. No JVD. No thrill. No extra heart sounds. PULM: CTA B, no wheezes, crackles, rhonchi. No retractions. No resp. distress. No accessory muscle use. ABD: S, NT, ND, +BS. No rebound. No HSM. EXTR: No c/c/e PSYCH: Normally interactive. Conversant.   Results for orders placed or performed in visit on 08/11/23  POCT glucose (manual entry)  Result Value Ref Range   POC Glucose 106 (A) 70 - 99 mg/dl   *Note: Due to a large number of results and/or encounters for the requested time period, some results have not been displayed. A complete set of results can be found in Results Review.    She is expecting to get a new meter this week  Assessment and Plan: Type 2 diabetes mellitus with hyperglycemia, without long-term current use of insulin (HCC) - Plan: Basic metabolic panel, Hemoglobin A1c, POCT glucose (manual entry)  Patient seen today with concern of elevated blood sugar.  Her diabetes has always been well-controlled, we wonder if her blood sugar may be high due to recent steroid injection.  However, her blood sugar today was 106- she is not fasting.  This raises suspicion that perhaps her glucose meter is the problem.  Patient notes she should be getting a new meter at home hopefully today or tomorrow.  If this does not happen she can bring in her old meter to test against 1 of ours at her convenience.  If her blood sugar is indeed high we may want to add an SGLT2 such as Jardiance.  She is not able to tolerate metformin due to severe diarrhea.  She can go ahead and take 2 mg of Amaryl right now she would like  Signed Abbe Amsterdam, MD  Addendum 10/15, received labs as below.  Message to patient  Results for orders placed or performed in visit on 08/11/23  Basic metabolic panel  Result Value Ref Range   Sodium 140 135 - 145 mEq/L   Potassium 3.7 3.5 - 5.1 mEq/L   Chloride 106 96 - 112 mEq/L   CO2 26 19 -  32 mEq/L   Glucose, Bld 99 70 - 99 mg/dL   BUN 24 (H) 6 - 23 mg/dL   Creatinine, Ser 3.08 0.40 - 1.20 mg/dL   GFR 65.78 (L) >46.96 mL/min   Calcium 9.4 8.4 - 10.5 mg/dL  Hemoglobin E9B  Result Value Ref Range   Hgb A1c MFr Bld 6.6 (H) 4.6 - 6.5 %  POCT glucose (manual entry)  Result Value Ref Range   POC Glucose 106 (A) 70 - 99 mg/dl   *Note: Due to a large number of results and/or encounters for the requested time period, some results have not been displayed. A complete set of results can be found in Results Review.

## 2023-08-11 NOTE — Telephone Encounter (Signed)
Pt called regarding high blood sugar  Spoke with DOD per Dr.Copland needing be seen, pt  Scheduled to be seen today.  Patient's blood sugar is 412. She had an epidural done 2 weeks ago and is curious if that could cause it. Also complaining of back pain where the epidural was performed. Patient is mostly concerned about her back pain.

## 2023-08-11 NOTE — Telephone Encounter (Signed)
Nurse Assessment/ patient alled 10/11 and10/12 Nurse: [place], RN, Denyse Amass Date/Time Heidi Franklin Time): 08/08/2023 8:24:38 PM Confirm and document reason for call. If symptomatic, describe symptoms. ---Patient's blood sugar is 412. She had an epidural done 2 weeks ago and is curious if that could cause it. Also complaining of back pain where the epidural was performed. Patient is mostly concerned about her back pain.  Heidi Franklin Called 08/09/23 Gender: Female DOB: [birth year] Age: 30 Y 4 M 25 D ReturnPhone Number: [phone] (Primary), [phone] Provider Danise Edge - MD Who Is Calling PatientChief Complaint Blood Sugar High Initial Comment Caller states their sugar is high. Its been in the 200-400. The last reading was 261. They wanted to know if there is anything they can do to get it down. They feel sleepy but not sure if they are tired.

## 2023-08-11 NOTE — Telephone Encounter (Signed)
Called pt and scheduled today to see Dr.Copland.

## 2023-08-11 NOTE — Patient Instructions (Addendum)
I am hoping this elevation in blood sugar is temporary and due to recent steroid injection- or it may be your meter!   Ok to take 2 of the Amaryl if you wish in the meantime

## 2023-08-12 ENCOUNTER — Telehealth: Payer: Self-pay | Admitting: *Deleted

## 2023-08-12 ENCOUNTER — Encounter: Payer: Self-pay | Admitting: Neurosurgery

## 2023-08-12 ENCOUNTER — Ambulatory Visit: Payer: Medicare HMO | Admitting: Neurosurgery

## 2023-08-12 ENCOUNTER — Encounter: Payer: Self-pay | Admitting: Family Medicine

## 2023-08-12 VITALS — BP 120/68 | Ht 60.0 in | Wt 148.0 lb

## 2023-08-12 DIAGNOSIS — G8929 Other chronic pain: Secondary | ICD-10-CM

## 2023-08-12 DIAGNOSIS — N289 Disorder of kidney and ureter, unspecified: Secondary | ICD-10-CM

## 2023-08-12 DIAGNOSIS — M533 Sacrococcygeal disorders, not elsewhere classified: Secondary | ICD-10-CM

## 2023-08-12 DIAGNOSIS — M545 Low back pain, unspecified: Secondary | ICD-10-CM | POA: Diagnosis not present

## 2023-08-12 LAB — BASIC METABOLIC PANEL
BUN: 24 mg/dL — ABNORMAL HIGH (ref 6–23)
CO2: 26 meq/L (ref 19–32)
Calcium: 9.4 mg/dL (ref 8.4–10.5)
Chloride: 106 meq/L (ref 96–112)
Creatinine, Ser: 1.1 mg/dL (ref 0.40–1.20)
GFR: 48.16 mL/min — ABNORMAL LOW (ref >60.00)
Glucose, Bld: 99 mg/dL (ref 70–99)
Potassium: 3.7 meq/L (ref 3.5–5.1)
Sodium: 140 meq/L (ref 135–145)

## 2023-08-12 LAB — HEMOGLOBIN A1C: Hgb A1c MFr Bld: 6.6 % — ABNORMAL HIGH (ref 4.6–6.5)

## 2023-08-12 MED ORDER — METHOCARBAMOL 500 MG PO TABS: 500.0000 mg | ORAL_TABLET | Freq: Four times a day (QID) | ORAL | 0 refills | Status: DC | PRN

## 2023-08-12 MED ORDER — ISOSORBIDE MONONITRATE ER 30 MG PO TB24: 30.0000 mg | ORAL_TABLET | Freq: Every day | ORAL | 3 refills | Status: DC

## 2023-08-12 MED ORDER — MELOXICAM 15 MG PO TABS: 15.0000 mg | ORAL_TABLET | Freq: Every day | ORAL | 0 refills | Status: DC

## 2023-08-12 NOTE — Telephone Encounter (Signed)
Rx refill sent to pharmacy. 

## 2023-08-13 DIAGNOSIS — E1065 Type 1 diabetes mellitus with hyperglycemia: Secondary | ICD-10-CM | POA: Diagnosis not present

## 2023-08-14 ENCOUNTER — Ambulatory Visit: Payer: Medicare HMO | Admitting: Neurosurgery

## 2023-08-18 ENCOUNTER — Encounter: Payer: Self-pay | Admitting: Family Medicine

## 2023-08-23 ENCOUNTER — Other Ambulatory Visit: Payer: Self-pay | Admitting: Family Medicine

## 2023-08-28 DIAGNOSIS — L309 Dermatitis, unspecified: Secondary | ICD-10-CM | POA: Diagnosis not present

## 2023-08-28 DIAGNOSIS — L408 Other psoriasis: Secondary | ICD-10-CM | POA: Diagnosis not present

## 2023-08-31 ENCOUNTER — Other Ambulatory Visit: Payer: Self-pay | Admitting: Cardiology

## 2023-09-02 ENCOUNTER — Ambulatory Visit: Payer: Medicare HMO | Admitting: Cardiology

## 2023-09-02 DIAGNOSIS — R2689 Other abnormalities of gait and mobility: Secondary | ICD-10-CM | POA: Diagnosis not present

## 2023-09-02 DIAGNOSIS — R296 Repeated falls: Secondary | ICD-10-CM | POA: Diagnosis not present

## 2023-09-02 DIAGNOSIS — G8929 Other chronic pain: Secondary | ICD-10-CM | POA: Diagnosis not present

## 2023-09-02 DIAGNOSIS — E119 Type 2 diabetes mellitus without complications: Secondary | ICD-10-CM | POA: Diagnosis not present

## 2023-09-03 ENCOUNTER — Ambulatory Visit: Payer: Medicare HMO | Attending: Family Medicine

## 2023-09-03 ENCOUNTER — Encounter: Payer: Self-pay | Admitting: Family Medicine

## 2023-09-03 ENCOUNTER — Ambulatory Visit: Payer: Medicare HMO | Admitting: Family Medicine

## 2023-09-03 VITALS — BP 118/58 | HR 82 | Ht 60.0 in | Wt 149.0 lb

## 2023-09-03 DIAGNOSIS — R002 Palpitations: Secondary | ICD-10-CM | POA: Diagnosis not present

## 2023-09-03 DIAGNOSIS — R42 Dizziness and giddiness: Secondary | ICD-10-CM

## 2023-09-03 DIAGNOSIS — W19XXXA Unspecified fall, initial encounter: Secondary | ICD-10-CM

## 2023-09-03 LAB — CBC WITH DIFFERENTIAL/PLATELET
Basophils Absolute: 0 10*3/uL (ref 0.0–0.1)
Basophils Relative: 0.4 % (ref 0.0–3.0)
Eosinophils Absolute: 0.1 10*3/uL (ref 0.0–0.7)
Eosinophils Relative: 0.7 % (ref 0.0–5.0)
HCT: 38.5 % (ref 36.0–46.0)
Hemoglobin: 12.5 g/dL (ref 12.0–15.0)
Lymphocytes Relative: 43.3 % (ref 12.0–46.0)
Lymphs Abs: 3.3 10*3/uL (ref 0.7–4.0)
MCHC: 32.5 g/dL (ref 30.0–36.0)
MCV: 96.5 fL (ref 78.0–100.0)
Monocytes Absolute: 0.6 10*3/uL (ref 0.1–1.0)
Monocytes Relative: 7.4 % (ref 3.0–12.0)
Neutro Abs: 3.7 10*3/uL (ref 1.4–7.7)
Neutrophils Relative %: 48.2 % (ref 43.0–77.0)
Platelets: 224 10*3/uL (ref 150.0–400.0)
RBC: 3.99 Mil/uL (ref 3.87–5.11)
RDW: 13.9 % (ref 11.5–15.5)
WBC: 7.6 10*3/uL (ref 4.0–10.5)

## 2023-09-03 LAB — BASIC METABOLIC PANEL
BUN: 15 mg/dL (ref 6–23)
CO2: 28 meq/L (ref 19–32)
Calcium: 9.6 mg/dL (ref 8.4–10.5)
Chloride: 105 meq/L (ref 96–112)
Creatinine, Ser: 1.07 mg/dL (ref 0.40–1.20)
GFR: 49.76 mL/min — ABNORMAL LOW (ref >60.00)
Glucose, Bld: 96 mg/dL (ref 70–99)
Potassium: 3.7 meq/L (ref 3.5–5.1)
Sodium: 142 meq/L (ref 135–145)

## 2023-09-03 LAB — TSH: TSH: 4.01 u[IU]/mL (ref 0.35–5.50)

## 2023-09-03 NOTE — Progress Notes (Unsigned)
EP to read
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
## 2023-09-03 NOTE — Patient Instructions (Addendum)
EKG: NSR 68, no ST/T wave changes Ordering a 2 week heart monitor Updating labs today Please call your cardiologist and let them know about the falling and palpitations so they can get you in sooner to be evaluated

## 2023-09-04 DIAGNOSIS — G8929 Other chronic pain: Secondary | ICD-10-CM | POA: Diagnosis not present

## 2023-09-04 DIAGNOSIS — R296 Repeated falls: Secondary | ICD-10-CM | POA: Diagnosis not present

## 2023-09-04 DIAGNOSIS — E119 Type 2 diabetes mellitus without complications: Secondary | ICD-10-CM | POA: Diagnosis not present

## 2023-09-04 DIAGNOSIS — R2689 Other abnormalities of gait and mobility: Secondary | ICD-10-CM | POA: Diagnosis not present

## 2023-09-05 DIAGNOSIS — R42 Dizziness and giddiness: Secondary | ICD-10-CM | POA: Diagnosis not present

## 2023-09-05 DIAGNOSIS — R002 Palpitations: Secondary | ICD-10-CM | POA: Diagnosis not present

## 2023-09-06 ENCOUNTER — Encounter: Payer: Self-pay | Admitting: Family Medicine

## 2023-09-08 ENCOUNTER — Other Ambulatory Visit: Payer: Self-pay | Admitting: Cardiology

## 2023-09-10 ENCOUNTER — Other Ambulatory Visit: Payer: Self-pay | Admitting: Cardiology

## 2023-09-10 DIAGNOSIS — L400 Psoriasis vulgaris: Secondary | ICD-10-CM | POA: Diagnosis not present

## 2023-09-17 ENCOUNTER — Telehealth: Payer: Self-pay | Admitting: Sports Medicine

## 2023-09-17 ENCOUNTER — Ambulatory Visit: Payer: Medicare HMO | Admitting: Sports Medicine

## 2023-09-17 ENCOUNTER — Ambulatory Visit (INDEPENDENT_AMBULATORY_CARE_PROVIDER_SITE_OTHER): Payer: Medicare HMO

## 2023-09-17 VITALS — BP 118/60 | HR 110 | Ht 60.0 in | Wt 152.0 lb

## 2023-09-17 DIAGNOSIS — M1711 Unilateral primary osteoarthritis, right knee: Secondary | ICD-10-CM

## 2023-09-17 DIAGNOSIS — M25561 Pain in right knee: Secondary | ICD-10-CM | POA: Diagnosis not present

## 2023-09-17 DIAGNOSIS — R296 Repeated falls: Secondary | ICD-10-CM

## 2023-09-17 DIAGNOSIS — M5136 Other intervertebral disc degeneration, lumbar region with discogenic back pain only: Secondary | ICD-10-CM | POA: Diagnosis not present

## 2023-09-17 DIAGNOSIS — G8929 Other chronic pain: Secondary | ICD-10-CM | POA: Diagnosis not present

## 2023-09-17 NOTE — Patient Instructions (Signed)
Recommend contacting neurosurgeon and let them know you are having increased weakness and falls and ask for an appointment  As needed follow up

## 2023-09-17 NOTE — Progress Notes (Signed)
Heidi Franklin D.[name] [address] [place] [address] Phone: [phone]   Assessment and Plan:     1. Chronic pain of right knee 2. Primary osteoarthritis of right knee -Chronic with exacerbation, subsequent visit - Recurrence of right knee pain, flared after a fall - Repeat right knee x-ray obtained due to fall.  My interpretation: No acute fracture or dislocation.  Similar-appearing chronic degenerative changes - Patient elected for intra-articular knee CSI.  Tolerated well per note below  Procedure: Knee Joint Injection Side: Right Indication: Flare of osteoarthritis  Risks explained and consent was given verbally. The site was cleaned with alcohol prep. A needle was introduced with an anterio-lateral approach. Injection given using 2mL of 1% lidocaine without epinephrine and 1mL of kenalog 40mg /ml. This was well tolerated and resulted in symptomatic relief.  Needle was removed, hemostasis achieved, and post injection instructions were explained.   Pt was advised to call or return to clinic if these symptoms worsen or fail to improve as anticipated.   3. Degeneration of intervertebral disc of lumbar region with discogenic back pain 4. Multiple falls  -Chronic with exacerbation, subsequent visit - Patient continues to have low back pain, however red flag symptoms with patient having increasing falls and feeling like her lower extremities are giving out from under her.  She has not had significant benefit with conservative therapy including NSAID courses, epidural CSI, physical therapy, home exercises. - Due to increased falls and lower extremities giving out, I recommend reevaluation with neurosurgery.  Recommend stopping physical therapy until patient can be evaluated by neurosurgery   Pertinent previous records reviewed include none  Follow Up: As needed   Subjective:   I, [name], am serving as a Neurosurgeon for Doctor  Richardean Sale  Chief Complaint: right knee pain   HPI:   09/17/23 Patient is a 77 year old female with concerns of right knee pain. Patient states she fell going up her stairs a week ago Sunday. Anterior knee pain. No radiating pain states the pain is deep. Tylenol for the pain and that does not help. Has been using cream as well    Relevant Historical Information: Hypertension, DM type II, CKD 3  Additional pertinent review of systems negative.   Current Outpatient Medications:    acetaminophen (TYLENOL) 325 MG tablet, Take 650 mg by mouth every 6 (six) hours as needed for mild pain or fever., Disp: , Rfl:    albuterol (VENTOLIN HFA) 108 (90 Base) MCG/ACT inhaler, INHALE 1-2 PUFFS INTO THE LUNGS EVERY 6 (SIX) HOURS AS NEEDED FOR WHEEZING OR SHORTNESS OF BREATH., Disp: 8.5 each, Rfl: 3   ALPRAZolam (XANAX) 0.5 MG tablet, TAKE 1 TABLET BY MOUTH TWICE A DAY AS NEEDED FOR ANXIETY, Disp: 60 tablet, Rfl: 1   amLODipine (NORVASC) 10 MG tablet, TAKE 1 TABLET BY MOUTH EVERY DAY, Disp: 90 tablet, Rfl: 0   aspirin 81 MG chewable tablet, Chew 81 mg by mouth daily., Disp: , Rfl:    betamethasone dipropionate (DIPROLENE) 0.05 % ointment, Apply 1 application  topically 2 (two) times daily., Disp: , Rfl:    budesonide-formoterol (SYMBICORT) 160-4.5 MCG/ACT inhaler, Inhale 2 puffs into the lungs 2 (two) times daily., Disp: 1 each, Rfl: 1   Cholecalciferol (VITAMIN D3) 50 MCG (2000 UT) capsule, Take 2,000 Units by mouth daily., Disp: , Rfl:    erythromycin ophthalmic ointment, Place 1 Application into the left eye 2 (two) times daily., Disp: , Rfl:  famotidine (PEPCID) 40 MG tablet, Take 1 tablet (40 mg total) by mouth daily., Disp: 90 tablet, Rfl: 0   fluticasone (CUTIVATE) 0.05 % cream, Apply 1 Application topically 2 (two) times daily., Disp: , Rfl:    fluticasone (FLONASE) 50 MCG/ACT nasal spray, SPRAY 2 SPRAYS INTO EACH NOSTRIL EVERY DAY (Patient taking differently: Place 2 sprays into both  nostrils daily.), Disp: 48 mL, Rfl: 2   glimepiride (AMARYL) 1 MG tablet, TAKE 1 TABLET BY MOUTH EVERY DAY WITH BREAKFAST, Disp: 90 tablet, Rfl: 1   glucose blood (ONE TOUCH ULTRA TEST) test strip, 1 each by Other route daily. Use 1 strips to check blood sugar twice a day Ex E11.9, Disp: 100 each, Rfl: 3   HUMIRA PEN-PS/UV/ADOL HS START 40 MG/0.8ML PNKT, Inject 1 Dose into the skin every 14 (fourteen) days. Every 2 weeks, Disp: , Rfl:    isosorbide mononitrate (IMDUR) 30 MG 24 hr tablet, Take 1 tablet (30 mg total) by mouth daily., Disp: 90 tablet, Rfl: 3   ketoconazole (NIZORAL) 2 % cream, Apply 1 Application topically daily as needed for irritation., Disp: , Rfl:    Lancets (ONETOUCH ULTRASOFT) lancets, PRN (Patient taking differently: 1 each by Other route as needed for other (glucose check). PRN), Disp: 100 each, Rfl: 3   meloxicam (MOBIC) 15 MG tablet, Take 1 tablet (15 mg total) by mouth daily., Disp: 60 tablet, Rfl: 0   methocarbamol (ROBAXIN) 500 MG tablet, Take 1 tablet (500 mg total) by mouth every 6 (six) hours as needed for muscle spasms., Disp: 40 tablet, Rfl: 0   metoprolol succinate (TOPROL-XL) 50 MG 24 hr tablet, Take 1 tablet (50 mg total) by mouth daily., Disp: 30 tablet, Rfl: 0   mometasone (ELOCON) 0.1 % ointment, Apply 1 application  topically daily., Disp: , Rfl:    montelukast (SINGULAIR) 10 MG tablet, TAKE 1 TABLET BY MOUTH EVERYDAY AT BEDTIME, Disp: 90 tablet, Rfl: 1   Multiple Vitamins-Minerals (CENTRUM SILVER PO), Take 1 tablet by mouth every morning., Disp: , Rfl:    pantoprazole (PROTONIX) 40 MG tablet, Take 1 tablet (40 mg total) by mouth 2 (two) times daily., Disp: 60 tablet, Rfl: 6   Potassium Chloride ER 20 MEQ TBCR, Take 1 tablet (20 mEq total) by mouth daily. TAKE 1 TABLET BY MOUTH EVERY DAY, Disp: 90 tablet, Rfl: 0   rosuvastatin (CRESTOR) 20 MG tablet, TAKE 1/2 TABLET BY MOUTH DAILY, Disp: 45 tablet, Rfl: 2   triamcinolone cream (KENALOG) 0.1 %, Apply 1  Application topically 2 (two) times daily as needed (rash)., Disp: , Rfl:    valACYclovir (VALTREX) 500 MG tablet, Take 500 mg by mouth 2 (two) times daily., Disp: , Rfl: 0   nitroGLYCERIN (NITROSTAT) 0.4 MG SL tablet, Place 1 tablet (0.4 mg total) under the tongue every 5 (five) minutes as needed. (Patient taking differently: Place 0.4 mg under the tongue every 5 (five) minutes as needed for chest pain.), Disp: 30 tablet, Rfl: 3   Objective:     Vitals:   09/17/23 1015  BP: 118/60  Pulse: (!) 110  SpO2: 100%  Weight: 152 lb (68.9 kg)  Height: 5' (1.524 m)      Body mass index is 29.69 kg/m.    Physical Exam:    General:  awake, alert oriented, no acute distress nontoxic Skin: no suspicious lesions or rashes Neuro:sensation intact and strength 5/5 with no deficits, no atrophy, normal muscle tone Psych: No signs of anxiety, depression or other mood disorder  Right knee: No swelling No deformity Neg fluid wave, joint milking ROM Flex 110, Ext 0 TTP medial and lateral femoral condyle, medial lateral joint line NTTP over the quad tendon,  patella, plica, patella tendon, tibial tuberostiy, fibular head, posterior fossa, pes anserine bursa, gerdy's tubercle  Gait slow with short steps   Electronically signed by:  Heidi Franklin [name] 10:40 AM 09/17/23

## 2023-09-17 NOTE — Telephone Encounter (Signed)
Error. Pt is being seen today for her right knee.

## 2023-09-20 DIAGNOSIS — R42 Dizziness and giddiness: Secondary | ICD-10-CM | POA: Diagnosis not present

## 2023-09-20 DIAGNOSIS — R002 Palpitations: Secondary | ICD-10-CM | POA: Diagnosis not present

## 2023-09-22 ENCOUNTER — Other Ambulatory Visit: Payer: Medicare HMO

## 2023-09-22 ENCOUNTER — Encounter: Payer: Self-pay | Admitting: Family Medicine

## 2023-09-22 ENCOUNTER — Other Ambulatory Visit (INDEPENDENT_AMBULATORY_CARE_PROVIDER_SITE_OTHER): Payer: Medicare HMO

## 2023-09-22 DIAGNOSIS — N289 Disorder of kidney and ureter, unspecified: Secondary | ICD-10-CM | POA: Diagnosis not present

## 2023-09-22 LAB — BASIC METABOLIC PANEL
BUN: 19 mg/dL (ref 6–23)
CO2: 27 meq/L (ref 19–32)
Calcium: 9.3 mg/dL (ref 8.4–10.5)
Chloride: 106 meq/L (ref 96–112)
Creatinine, Ser: 0.89 mg/dL (ref 0.40–1.20)
GFR: 62.05 mL/min (ref >60.00)
Glucose, Bld: 95 mg/dL (ref 70–99)
Potassium: 3.7 meq/L (ref 3.5–5.1)
Sodium: 141 meq/L (ref 135–145)

## 2023-09-30 ENCOUNTER — Ambulatory Visit: Payer: Medicare HMO | Admitting: Neurosurgery

## 2023-09-30 DIAGNOSIS — G8929 Other chronic pain: Secondary | ICD-10-CM | POA: Diagnosis not present

## 2023-09-30 DIAGNOSIS — M545 Low back pain, unspecified: Secondary | ICD-10-CM | POA: Diagnosis not present

## 2023-09-30 DIAGNOSIS — M533 Sacrococcygeal disorders, not elsewhere classified: Secondary | ICD-10-CM

## 2023-09-30 DIAGNOSIS — R2689 Other abnormalities of gait and mobility: Secondary | ICD-10-CM
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
## 2023-10-01 DIAGNOSIS — H524 Presbyopia: Secondary | ICD-10-CM | POA: Diagnosis not present

## 2023-10-01 DIAGNOSIS — H25813 Combined forms of age-related cataract, bilateral: Secondary | ICD-10-CM | POA: Diagnosis not present

## 2023-10-01 DIAGNOSIS — D23122 Other benign neoplasm of skin of left lower eyelid, including canthus: Secondary | ICD-10-CM | POA: Diagnosis not present

## 2023-10-01 DIAGNOSIS — E113293 Type 2 diabetes mellitus with mild nonproliferative diabetic retinopathy without macular edema, bilateral: Secondary | ICD-10-CM | POA: Diagnosis not present

## 2023-10-01 DIAGNOSIS — H43813 Vitreous degeneration, bilateral: Secondary | ICD-10-CM | POA: Diagnosis not present

## 2023-10-01 DIAGNOSIS — H52223 Regular astigmatism, bilateral: Secondary | ICD-10-CM | POA: Diagnosis not present

## 2023-10-01 LAB — HM DIABETES EYE EXAM

## 2023-10-03 ENCOUNTER — Encounter: Payer: Self-pay | Admitting: Physician Assistant

## 2023-10-03 ENCOUNTER — Ambulatory Visit (INDEPENDENT_AMBULATORY_CARE_PROVIDER_SITE_OTHER): Payer: Medicare HMO | Admitting: Physician Assistant

## 2023-10-03 VITALS — BP 131/82 | HR 71 | Temp 98.6°F | Ht 60.0 in | Wt 156.4 lb

## 2023-10-03 DIAGNOSIS — J029 Acute pharyngitis, unspecified: Secondary | ICD-10-CM

## 2023-10-03 DIAGNOSIS — J020 Streptococcal pharyngitis: Secondary | ICD-10-CM | POA: Diagnosis not present

## 2023-10-03 LAB — POCT RAPID STREP A (OFFICE): Rapid Strep A Screen: POSITIVE — AB

## 2023-10-03 MED ORDER — CEFDINIR 300 MG PO CAPS
300.0000 mg | ORAL_CAPSULE | Freq: Two times a day (BID) | ORAL | 0 refills | Status: AC
Start: 2023-10-03 — End: 2023-10-10

## 2023-10-03 NOTE — Progress Notes (Signed)
Established patient visit   [name]   DOB: 08-Feb-1946   77 y.o. Female  MRN: [id number] Visit Date: 10/03/2023  Today's healthcare provider: Alfredia Ferguson, PA-C   Chief Complaint  Patient presents with   Sore Throat    Present all week, complains only  left side hurts. OTC- tylenol. [place].   Subjective    Pt reports for the last 5-6 days, left side of her throat hurts worse when swallowing, gargling. Reports PND, hoarseness.  Medications: Outpatient Medications Prior to Visit  Medication Sig   acetaminophen (TYLENOL) 325 MG tablet Take 650 mg by mouth every 6 (six) hours as needed for mild pain or fever.   albuterol (VENTOLIN HFA) 108 (90 Base) MCG/ACT inhaler INHALE 1-2 PUFFS INTO THE LUNGS EVERY 6 (SIX) HOURS AS NEEDED FOR WHEEZING OR SHORTNESS OF BREATH.   ALPRAZolam (XANAX) 0.5 MG tablet TAKE 1 TABLET BY MOUTH TWICE A DAY AS NEEDED FOR ANXIETY   amLODipine (NORVASC) 10 MG tablet TAKE 1 TABLET BY MOUTH EVERY DAY   aspirin 81 MG chewable tablet Chew 81 mg by mouth daily.   betamethasone dipropionate (DIPROLENE) 0.05 % ointment Apply 1 application  topically 2 (two) times daily.   budesonide-formoterol (SYMBICORT) 160-4.5 MCG/ACT inhaler Inhale 2 puffs into the lungs 2 (two) times daily.   Cholecalciferol (VITAMIN D3) 50 MCG (2000 UT) capsule Take 2,000 Units by mouth daily.   erythromycin ophthalmic ointment Place 1 Application into the left eye 2 (two) times daily.   famotidine (PEPCID) 40 MG tablet Take 1 tablet (40 mg total) by mouth daily.   fluticasone (CUTIVATE) 0.05 % cream Apply 1 Application topically 2 (two) times daily.   fluticasone (FLONASE) 50 MCG/ACT nasal spray SPRAY 2 SPRAYS INTO EACH NOSTRIL EVERY DAY (Patient taking differently: Place 2 sprays into both nostrils daily.)   glimepiride (AMARYL) 1 MG tablet TAKE 1 TABLET BY MOUTH EVERY DAY WITH BREAKFAST   glucose blood (ONE TOUCH ULTRA TEST) test strip 1 each by Other route daily. Use 1  strips to check blood sugar twice a day Ex E11.9   HUMIRA PEN-PS/UV/ADOL HS START 40 MG/0.8ML PNKT Inject 1 Dose into the skin every 14 (fourteen) days. Every 2 weeks   isosorbide mononitrate (IMDUR) 30 MG 24 hr tablet Take 1 tablet (30 mg total) by mouth daily.   ketoconazole (NIZORAL) 2 % cream Apply 1 Application topically daily as needed for irritation.   Lancets (ONETOUCH ULTRASOFT) lancets PRN (Patient taking differently: 1 each by Other route as needed for other (glucose check). PRN)   meloxicam (MOBIC) 15 MG tablet Take 1 tablet (15 mg total) by mouth daily.   methocarbamol (ROBAXIN) 500 MG tablet Take 1 tablet (500 mg total) by mouth every 6 (six) hours as needed for muscle spasms.   metoprolol succinate (TOPROL-XL) 50 MG 24 hr tablet Take 1 tablet (50 mg total) by mouth daily.   mometasone (ELOCON) 0.1 % ointment Apply 1 application  topically daily.   montelukast (SINGULAIR) 10 MG tablet TAKE 1 TABLET BY MOUTH EVERYDAY AT BEDTIME   Multiple Vitamins-Minerals (CENTRUM SILVER PO) Take 1 tablet by mouth every morning.   pantoprazole (PROTONIX) 40 MG tablet Take 1 tablet (40 mg total) by mouth 2 (two) times daily.   Potassium Chloride ER 20 MEQ TBCR Take 1 tablet (20 mEq total) by mouth daily. TAKE 1 TABLET BY MOUTH EVERY DAY   rosuvastatin (CRESTOR) 20 MG tablet TAKE 1/2 TABLET BY MOUTH DAILY  triamcinolone cream (KENALOG) 0.1 % Apply 1 Application topically 2 (two) times daily as needed (rash).   valACYclovir (VALTREX) 500 MG tablet Take 500 mg by mouth 2 (two) times daily.   nitroGLYCERIN (NITROSTAT) 0.4 MG SL tablet Place 1 tablet (0.4 mg total) under the tongue every 5 (five) minutes as needed. (Patient taking differently: Place 0.4 mg under the tongue every 5 (five) minutes as needed for chest pain.)   No facility-administered medications prior to visit.    Review of Systems  Constitutional:  Negative for fatigue and fever.  HENT:  Positive for postnasal drip, sore throat and  voice change.   Respiratory:  Negative for cough and shortness of breath.   Cardiovascular:  Negative for chest pain and leg swelling.  Gastrointestinal:  Negative for abdominal pain.  Neurological:  Negative for dizziness and headaches.       Objective    BP 131/82   Pulse 71   Temp 98.6 F (37 C) (Oral)   Ht 5' (1.524 m)   Wt 156 lb 6 oz (70.9 kg)   SpO2 98%   BMI 30.54 kg/m    Physical Exam Vitals reviewed.  Constitutional:      Appearance: She is not ill-appearing.  HENT:     Head: Normocephalic.     Mouth/Throat:     Pharynx: Posterior oropharyngeal erythema present.     Tonsils: Tonsillar exudate present. 1+ on the right. 1+ on the left.  Eyes:     Conjunctiva/sclera: Conjunctivae normal.  Cardiovascular:     Rate and Rhythm: Normal rate.  Pulmonary:     Effort: Pulmonary effort is normal. No respiratory distress.  Neurological:     General: No focal deficit present.     Mental Status: She is alert and oriented to person, place, and time.  Psychiatric:        Mood and Affect: Mood normal.        Behavior: Behavior normal.      Results for orders placed or performed in visit on 10/03/23  POCT rapid strep A  Result Value Ref Range   Rapid Strep A Screen Positive (A) Negative    Assessment & Plan    Acute streptococcal pharyngitis -     Cefdinir; Take 1 capsule (300 mg total) by mouth 2 (two) times daily for 7 days.  Dispense: 14 capsule; Refill: 0  Sore throat -     POCT rapid strep A   Poc strep +  Pcn allergy-- I see history of taking omnicef on her chart Rx cefidinir 300 mg bid x 7 days Hydration, gargling, tylenol  Return if symptoms worsen or fail to improve.       Alfredia Ferguson, PA-C  [facility] [phone] (phone) [phone] (fax)  [facility]

## 2023-10-10 ENCOUNTER — Other Ambulatory Visit: Payer: Self-pay | Admitting: Physician Assistant

## 2023-10-10 DIAGNOSIS — E119 Type 2 diabetes mellitus without complications: Secondary | ICD-10-CM | POA: Diagnosis not present

## 2023-10-10 DIAGNOSIS — L309 Dermatitis, unspecified: Secondary | ICD-10-CM | POA: Diagnosis not present

## 2023-10-10 DIAGNOSIS — R296 Repeated falls: Secondary | ICD-10-CM | POA: Diagnosis not present

## 2023-10-10 DIAGNOSIS — L400 Psoriasis vulgaris: Secondary | ICD-10-CM | POA: Diagnosis not present

## 2023-10-10 DIAGNOSIS — G8929 Other chronic pain: Secondary | ICD-10-CM | POA: Diagnosis not present

## 2023-10-10 DIAGNOSIS — R2689 Other abnormalities of gait and mobility: Secondary | ICD-10-CM | POA: Diagnosis not present

## 2023-10-14 DIAGNOSIS — R296 Repeated falls: Secondary | ICD-10-CM | POA: Diagnosis not present

## 2023-10-14 DIAGNOSIS — G8929 Other chronic pain: Secondary | ICD-10-CM | POA: Diagnosis not present

## 2023-10-14 DIAGNOSIS — R2689 Other abnormalities of gait and mobility: Secondary | ICD-10-CM | POA: Diagnosis not present

## 2023-10-14 DIAGNOSIS — E119 Type 2 diabetes mellitus without complications: Secondary | ICD-10-CM | POA: Diagnosis not present

## 2023-10-15 LAB — DERMATOLOGY PATHOLOGY

## 2023-10-17 ENCOUNTER — Other Ambulatory Visit: Payer: Self-pay | Admitting: Family Medicine

## 2023-10-18 ENCOUNTER — Other Ambulatory Visit: Payer: Self-pay | Admitting: Cardiology

## 2023-10-18 ENCOUNTER — Other Ambulatory Visit: Payer: Self-pay | Admitting: Family Medicine

## 2023-10-19 ENCOUNTER — Other Ambulatory Visit: Payer: Self-pay | Admitting: Family Medicine

## 2023-10-20 NOTE — Telephone Encounter (Signed)
Requesting: alprazolam 0.5mg   Contract: 06/11/22 UDS: 06/11/22 Last Visit: 10/03/23 w/ Lillia Abed Next Visit:  11/18/23 Last Refill: 12/26/22 #60 and 1rf   Please Advise

## 2023-10-24 DIAGNOSIS — G8929 Other chronic pain: Secondary | ICD-10-CM | POA: Diagnosis not present

## 2023-10-24 DIAGNOSIS — E119 Type 2 diabetes mellitus without complications: Secondary | ICD-10-CM | POA: Diagnosis not present

## 2023-10-24 DIAGNOSIS — R2689 Other abnormalities of gait and mobility: Secondary | ICD-10-CM | POA: Diagnosis not present

## 2023-10-24 DIAGNOSIS — R296 Repeated falls: Secondary | ICD-10-CM | POA: Diagnosis not present

## 2023-10-29 DIAGNOSIS — R2689 Other abnormalities of gait and mobility: Secondary | ICD-10-CM | POA: Diagnosis not present

## 2023-10-29 DIAGNOSIS — G8929 Other chronic pain: Secondary | ICD-10-CM | POA: Diagnosis not present

## 2023-10-29 DIAGNOSIS — E119 Type 2 diabetes mellitus without complications: Secondary | ICD-10-CM | POA: Diagnosis not present

## 2023-10-29 DIAGNOSIS — R296 Repeated falls: Secondary | ICD-10-CM | POA: Diagnosis not present

## 2023-10-31 DIAGNOSIS — R296 Repeated falls: Secondary | ICD-10-CM | POA: Diagnosis not present

## 2023-10-31 DIAGNOSIS — G8929 Other chronic pain: Secondary | ICD-10-CM | POA: Diagnosis not present

## 2023-10-31 DIAGNOSIS — E119 Type 2 diabetes mellitus without complications: Secondary | ICD-10-CM | POA: Diagnosis not present

## 2023-10-31 DIAGNOSIS — R2689 Other abnormalities of gait and mobility: Secondary | ICD-10-CM | POA: Diagnosis not present

## 2023-11-01 ENCOUNTER — Other Ambulatory Visit: Payer: Self-pay | Admitting: Cardiology

## 2023-11-05 DIAGNOSIS — R2689 Other abnormalities of gait and mobility: Secondary | ICD-10-CM | POA: Diagnosis not present

## 2023-11-05 DIAGNOSIS — E119 Type 2 diabetes mellitus without complications: Secondary | ICD-10-CM | POA: Diagnosis not present

## 2023-11-05 DIAGNOSIS — G8929 Other chronic pain: Secondary | ICD-10-CM | POA: Diagnosis not present

## 2023-11-05 DIAGNOSIS — R296 Repeated falls: Secondary | ICD-10-CM | POA: Diagnosis not present

## 2023-11-07 ENCOUNTER — Ambulatory Visit: Payer: Medicare HMO | Attending: Cardiology | Admitting: Cardiology

## 2023-11-07 ENCOUNTER — Encounter: Payer: Self-pay | Admitting: Cardiology

## 2023-11-07 VITALS — BP 112/64 | HR 88 | Ht 60.0 in | Wt 147.0 lb

## 2023-11-07 DIAGNOSIS — E1165 Type 2 diabetes mellitus with hyperglycemia: Secondary | ICD-10-CM | POA: Diagnosis not present

## 2023-11-07 DIAGNOSIS — I10 Essential (primary) hypertension: Secondary | ICD-10-CM

## 2023-11-07 DIAGNOSIS — E1169 Type 2 diabetes mellitus with other specified complication: Secondary | ICD-10-CM

## 2023-11-07 DIAGNOSIS — E785 Hyperlipidemia, unspecified: Secondary | ICD-10-CM

## 2023-11-07 DIAGNOSIS — I2510 Atherosclerotic heart disease of native coronary artery without angina pectoris: Secondary | ICD-10-CM | POA: Diagnosis not present

## 2023-11-07 MED ORDER — ROSUVASTATIN CALCIUM 40 MG PO TABS: 40.0000 mg | ORAL_TABLET | Freq: Every day | ORAL | 3 refills | Status: DC

## 2023-11-07 NOTE — Patient Instructions (Addendum)
 Medication Instructions:   INCREASE: Crestor  to 40mg  1 tablet daily   Lab Work: Your physician recommends that you return for lab work in: 6 weeks You need to have labs done when you are fasting.  You can come Monday through Friday 8:30 am to 12:00 pm and 1:15 to 4:30. You do not need to make an appointment as the order has already been placed. The labs you are going to have done are AST, ALT  Lipids.    Testing/Procedures: None Ordered   Follow-Up: At [facility], you and your health needs are our priority.  As part of our continuing mission to provide you with exceptional heart care, we have created designated Provider Care Teams.  These Care Teams include your primary Cardiologist (physician) and Advanced Practice Providers (APPs -  Physician Assistants and Nurse Practitioners) who all work together to provide you with the care you need, when you need it.  We recommend signing up for the patient portal called MyChart.  Sign up information is provided on this After Visit Summary.  MyChart is used to connect with patients for Virtual Visits (Telemedicine).  Patients are able to view lab/test results, encounter notes, upcoming appointments, etc.  Non-urgent messages can be sent to your provider as well.   To learn more about what you can do with MyChart, go to [email or web address].    Your next appointment:   12 month(s)  The format for your next appointment:   In Person  Provider:   Lamar Fitch, MD    Other Instructions NA

## 2023-11-07 NOTE — Progress Notes (Signed)
 Cardiology Office Note:    Date:  11/07/2023   ID:  Heidi Franklin, DOB 10-Feb-1946, MRN [id number]  PCP:  Domenica Harlene LABOR, MD  Cardiologist:  Lamar Fitch, MD    Referring MD: Domenica Harlene LABOR, MD   Chief Complaint  Patient presents with   Follow-up    History of Present Illness:    Heidi Franklin is a 78 y.o. female  past medical history significant for coronary artery disease in January 01, 2022. Coronary CT angio done which showed calcium  score being elevated she is in the 83rd percentile, there was stenosis of left anterior descending artery 50 to 69% stenosis, ramus branch a 25 to 49% stenosis diagonal 25% circumflex 25. There were no hemodynamically significant lesion identified. Additional problem include diabetes, dyslipidemia, essential hypertension.  Comes today edema to my office for follow-up.  Overall seems to be doing well.  Denies any exertional chest pain but does have some pain sometimes when she gets upset she said while ago worked quite often but now there is a very rare episodes.  Overall seems to be doing fine.  Past Medical History:  Diagnosis Date   ALLERGIC RHINITIS    Allergy     Anxiety state, unspecified    Arthritis    Asthma    BRONCHITIS, CHRONIC    Cataract    b/l   Chronic low back pain 10/09/2016   COPD (chronic obstructive pulmonary disease) (HCC)    DEPRESSION    DIABETES MELLITUS, TYPE II    GERD    HYPERLIPIDEMIA    HYPERTENSION    Pneumonia    Psoriasis     Past Surgical History:  Procedure Laterality Date   ABDOMINAL HYSTERECTOMY  1980   CARPAL TUNNEL RELEASE Right    COLONOSCOPY     TRIGGER FINGER RELEASE Left    Index finger   UPPER GASTROINTESTINAL ENDOSCOPY      Current Medications: Current Meds  Medication Sig   acetaminophen  (TYLENOL ) 325 MG tablet Take 650 mg by mouth every 6 (six) hours as needed for mild pain or fever.   albuterol  (VENTOLIN  HFA) 108 (90 Base) MCG/ACT inhaler INHALE 1-2 PUFFS INTO THE LUNGS EVERY 6  (SIX) HOURS AS NEEDED FOR WHEEZING OR SHORTNESS OF BREATH.   ALPRAZolam  (XANAX ) 0.5 MG tablet TAKE 1 TABLET BY MOUTH TWICE A DAY AS NEEDED FOR ANXIETY (Patient taking differently: Take 0.5 mg by mouth at bedtime as needed for anxiety or sleep.)   amLODipine  (NORVASC ) 10 MG tablet TAKE 1 TABLET BY MOUTH EVERY DAY   aspirin  81 MG chewable tablet Chew 81 mg by mouth daily.   betamethasone  dipropionate (DIPROLENE ) 0.05 % ointment Apply 1 application  topically 2 (two) times daily.   budesonide -formoterol  (SYMBICORT ) 160-4.5 MCG/ACT inhaler Inhale 2 puffs into the lungs 2 (two) times daily.   Cholecalciferol (VITAMIN D3) 50 MCG (2000 UT) capsule Take 2,000 Units by mouth daily.   erythromycin  ophthalmic ointment Place 1 Application into the left eye 2 (two) times daily.   famotidine  (PEPCID ) 40 MG tablet Take 1 tablet (40 mg total) by mouth daily.   fluticasone  (CUTIVATE ) 0.05 % cream Apply 1 Application topically 2 (two) times daily.   fluticasone  (FLONASE ) 50 MCG/ACT nasal spray SPRAY 2 SPRAYS INTO EACH NOSTRIL EVERY DAY (Patient taking differently: Place 2 sprays into both nostrils daily.)   glimepiride  (AMARYL ) 1 MG tablet TAKE 1 TABLET BY MOUTH EVERY DAY WITH BREAKFAST (Patient taking differently: Take 1 mg by mouth daily with breakfast.)  glucose blood (ONE TOUCH ULTRA TEST) test strip 1 each by Other route daily. Use 1 strips to check blood sugar twice a day Ex E11.9   HUMIRA  PEN-PS/UV/ADOL HS START 40 MG/0.8ML PNKT Inject 1 Dose into the skin every 14 (fourteen) days. Every 2 weeks   isosorbide  mononitrate (IMDUR ) 30 MG 24 hr tablet Take 1 tablet (30 mg total) by mouth daily.   ketoconazole  (NIZORAL ) 2 % cream Apply 1 Application topically daily as needed for irritation.   Lancets (ONETOUCH ULTRASOFT) lancets PRN (Patient taking differently: 1 each by Other route as needed for other (glucose check). PRN)   meloxicam  (MOBIC ) 15 MG tablet Take 1 tablet (15 mg total) by mouth daily.    methocarbamol  (ROBAXIN ) 500 MG tablet Take 1 tablet (500 mg total) by mouth every 6 (six) hours as needed for muscle spasms.   metoprolol  succinate (TOPROL -XL) 50 MG 24 hr tablet Take 1 tablet (50 mg total) by mouth daily. Final attempt, patient needs and appt for additional refills   mometasone (ELOCON) 0.1 % ointment Apply 1 application  topically daily.   montelukast  (SINGULAIR ) 10 MG tablet TAKE 1 TABLET BY MOUTH EVERYDAY AT BEDTIME (Patient taking differently: Take 10 mg by mouth.)   Multiple Vitamins-Minerals (CENTRUM SILVER PO) Take 1 tablet by mouth every morning.   nitroGLYCERIN  (NITROSTAT ) 0.4 MG SL tablet Place 1 tablet (0.4 mg total) under the tongue every 5 (five) minutes as needed. (Patient taking differently: Place 0.4 mg under the tongue every 5 (five) minutes as needed for chest pain.)   pantoprazole  (PROTONIX ) 40 MG tablet Take 1 tablet (40 mg total) by mouth 2 (two) times daily.   Potassium Chloride  ER 20 MEQ TBCR TAKE 1 TABLET (20 MEQ TOTAL) BY MOUTH DAILY. TAKE 1 TABLET BY MOUTH EVERY DAY   rosuvastatin  (CRESTOR ) 20 MG tablet TAKE 1/2 TABLET BY MOUTH DAILY   triamcinolone  cream (KENALOG ) 0.1 % Apply 1 Application topically 2 (two) times daily as needed (rash).   valACYclovir  (VALTREX ) 500 MG tablet Take 500 mg by mouth 2 (two) times daily.     Allergies:   Metformin  and related and Penicillins   Social History   Socioeconomic History   Marital status: Married    Spouse name: Not on file   Number of children: 0   Years of education: 3yr colge   Highest education level: Some college, no degree  Occupational History   Occupation: Retired   Tobacco Use   Smoking status: Never    Passive exposure: Never   Smokeless tobacco: Never  Vaping Use   Vaping status: Never Used  Substance and Sexual Activity   Alcohol  use: No    Alcohol /week: 0.0 standard drinks of alcohol    Drug use: No   Sexual activity: Not Currently  Other Topics Concern   Not on file  Social History  Narrative   Married, lives with spouse-retired from [employer] insurance   Not employed    Drinks coffee occasional, Consumes 1 soda a day    No dietary restrictions   Social Drivers of Corporate Investment Banker Strain: Medium Risk (05/03/2023)   Overall Financial Resource Strain (CARDIA)    Difficulty of Paying Living Expenses: Somewhat hard  Food Insecurity: Patient Declined (05/03/2023)   Hunger Vital Sign    Worried About Running Out of Food in the Last Year: Patient declined    Ran Out of Food in the Last Year: Patient declined  Transportation Needs: No Transportation Needs (05/03/2023)   PRAPARE -  Administrator, Civil Service (Medical): No    Lack of Transportation (Non-Medical): No  Physical Activity: Inactive (05/03/2023)   Exercise Vital Sign    Days of Exercise per Week: 0 days    Minutes of Exercise per Session: 10 min  Stress: Stress Concern Present (05/03/2023)   [employer] of Occupational Health - Occupational Stress Questionnaire    Feeling of Stress : To some extent  Social Connections: Unknown (05/03/2023)   Social Connection and Isolation Panel [NHANES]    Frequency of Communication with Friends and Family: Twice a week    Frequency of Social Gatherings with Friends and Family: Patient declined    Attends Religious Services: More than 4 times per year    Active Member of [employer] or Organizations: Yes    Attends Engineer, Structural: 1 to 4 times per year    Marital Status: Married     Family History: The patient's family history includes Angina in her mother; Arthritis in her sister; CAD in her sister and sister; COPD in her sister; Depression in her mother; Diabetes in her brother, brother, father, and sister; Heart disease in her mother; Hyperlipidemia in her brother, brother, mother, sister, sister, and another family member; Hypertension in her brother, brother, mother, sister, sister, and another family member; Stroke in her mother. There is no  history of Colon cancer, Esophageal cancer, Rectal cancer, Stomach cancer, or Pancreatic cancer. ROS:   Please see the history of present illness.    All 14 point review of systems negative except as described per history of present illness  EKGs/Labs/Other Studies Reviewed:    EKG Interpretation Date/Time:  Friday November 07 2023 07:55:59 EST Ventricular Rate:  87 PR Interval:  140 QRS Duration:  72 QT Interval:  368 QTC Calculation: 442 R Axis:   -22  Text Interpretation: Normal sinus rhythm Normal ECG When compared with ECG of 31-May-2022 15:04, PREVIOUS ECG IS PRESENT Confirmed by Bernie Charleston [phone]) on 11/07/2023 8:09:52 AM    Recent Labs: 06/27/2023: ALT 16 09/03/2023: Hemoglobin 12.5; Platelets 224.0; TSH 4.01 09/22/2023: BUN 19; Creatinine, Ser 0.89; Potassium 3.7; Sodium 141  Recent Lipid Panel    Component Value Date/Time   CHOL 163 06/27/2023 1111   TRIG 153.0 (H) 06/27/2023 1111   HDL 42.00 06/27/2023 1111   CHOLHDL 4 06/27/2023 1111   VLDL 30.6 06/27/2023 1111   LDLCALC 90 06/27/2023 1111   LDLDIRECT 155.1 02/09/2013 1113    Physical Exam:    VS:  BP 112/64 (BP Location: Right Arm, Patient Position: Sitting)   Pulse 88   Ht 5' (1.524 m)   Wt 147 lb (66.7 kg)   SpO2 97%   BMI 28.71 kg/m     Wt Readings from Last 3 Encounters:  11/07/23 147 lb (66.7 kg)  10/03/23 156 lb 6 oz (70.9 kg)  09/17/23 152 lb (68.9 kg)     GEN:  Well nourished, well developed in no acute distress HEENT: Normal NECK: No JVD; No carotid bruits LYMPHATICS: No lymphadenopathy CARDIAC: RRR, no murmurs, no rubs, no gallops RESPIRATORY:  Clear to auscultation without rales, wheezing or rhonchi  ABDOMEN: Soft, non-tender, non-distended MUSCULOSKELETAL:  No edema; No deformity  SKIN: Warm and dry LOWER EXTREMITIES: no swelling NEUROLOGIC:  Alert and oriented x 3 PSYCHIATRIC:  Normal affect   ASSESSMENT:    1. Essential hypertension   2. Coronary artery disease involving  native coronary artery of native heart without angina pectoris   3. Hyperlipidemia  associated with type 2 diabetes mellitus (HCC)   4. Type 2 diabetes mellitus with hyperglycemia, without long-term current use of insulin (HCC)    PLAN:    In order of problems listed above:  Essential hypertension blood pressure well-controlled we will continue present management. Coronary artery disease moderate disease based on coronary CT angio from February 2023.  Lack of symptoms indicating worsening of the condition.  Will continue antiplatelet therapy as well as risk reductions. Type 2 diabetes hemoglobin A1c 6.3 this is dated from 08/11/2023 doing quite well. Dyslipidemia I did review K PN data from summer of this year show LDL 90 HDL 42 she is on Crestor  20 we will augment this therapy.  Will increase Crestor  to 40 mg daily, fasting lipid profile, AST ALT will be done 6 weeks   Medication Adjustments/Labs and Tests Ordered: Current medicines are reviewed at length with the patient today.  Concerns regarding medicines are outlined above.  Orders Placed This Encounter  Procedures   EKG 12-Lead   Medication changes: No orders of the defined types were placed in this encounter.   Signed, Lamar DOROTHA Fitch, MD, [facility] 11/07/2023 8:22 AM    [facility]

## 2023-11-11 DIAGNOSIS — L400 Psoriasis vulgaris: Secondary | ICD-10-CM | POA: Diagnosis not present

## 2023-11-13 DIAGNOSIS — E1065 Type 1 diabetes mellitus with hyperglycemia: Secondary | ICD-10-CM | POA: Diagnosis not present

## 2023-11-16 NOTE — Assessment & Plan Note (Signed)
Doing well following with Cardiology

## 2023-11-16 NOTE — Assessment & Plan Note (Signed)
Supplement and monitor 

## 2023-11-16 NOTE — Assessment & Plan Note (Signed)
Encourage heart healthy diet such as MIND or DASH diet, increase exercise, avoid trans fats, simple carbohydrates and processed foods, consider a krill or fish or flaxseed oil cap daily.  Tolerating Rosuvastatin 

## 2023-11-16 NOTE — Assessment & Plan Note (Signed)
Hydrate and monitor 

## 2023-11-16 NOTE — Assessment & Plan Note (Signed)
hgba1c acceptable, minimize simple carbs. Increase exercise as tolerated. Continue current meds 

## 2023-11-16 NOTE — Assessment & Plan Note (Signed)
Encouraged moist heat and gentle stretching as tolerated. May try NSAIDs and prescription meds as directed and report if symptoms worsen or seek immediate care Following with neurosurgery

## 2023-11-17 ENCOUNTER — Telehealth: Payer: Medicare HMO | Admitting: Family Medicine

## 2023-11-17 ENCOUNTER — Ambulatory Visit: Payer: Medicare HMO | Admitting: Family Medicine

## 2023-11-18 ENCOUNTER — Encounter: Payer: Self-pay | Admitting: Family Medicine

## 2023-11-18 ENCOUNTER — Telehealth (INDEPENDENT_AMBULATORY_CARE_PROVIDER_SITE_OTHER): Payer: Medicare HMO | Admitting: Family Medicine

## 2023-11-18 VITALS — BP 116/72 | HR 88 | Ht 60.0 in | Wt 147.0 lb

## 2023-11-18 DIAGNOSIS — I2510 Atherosclerotic heart disease of native coronary artery without angina pectoris: Secondary | ICD-10-CM

## 2023-11-18 DIAGNOSIS — E785 Hyperlipidemia, unspecified: Secondary | ICD-10-CM

## 2023-11-18 DIAGNOSIS — M5416 Radiculopathy, lumbar region: Secondary | ICD-10-CM | POA: Diagnosis not present

## 2023-11-18 DIAGNOSIS — E1165 Type 2 diabetes mellitus with hyperglycemia: Secondary | ICD-10-CM | POA: Diagnosis not present

## 2023-11-18 DIAGNOSIS — E559 Vitamin D deficiency, unspecified: Secondary | ICD-10-CM | POA: Diagnosis not present

## 2023-11-18 DIAGNOSIS — M1A0410 Idiopathic chronic gout, right hand, without tophus (tophi): Secondary | ICD-10-CM

## 2023-11-18 DIAGNOSIS — E1169 Type 2 diabetes mellitus with other specified complication: Secondary | ICD-10-CM

## 2023-11-18 NOTE — Progress Notes (Signed)
MyChart Video Visit    Virtual Visit via Video Note   This patient is at least at moderate risk for complications without adequate follow up. This format is felt to be most appropriate for this patient at this time. Physical exam was limited by quality of the video and audio technology used for the visit. Juanetta, CMA was able to get the patient set up on a video visit.  Patient location: Home Patient and provider in visit Provider location: Office  I discussed the limitations of evaluation and management by telemedicine and the availability of in person appointments. The patient expressed understanding and agreed to proceed.  Visit Date: 11/18/2023  Today's healthcare provider: Danise Edge, MD     Subjective:    Patient ID: Heidi Franklin, female    DOB: [birth year], 78 y.o.   MRN: [id number]  Chief Complaint  Patient presents with  . Follow-up    HPI Discussed the use of AI scribe software for clinical note transcription with the patient, who gave verbal consent to proceed.  History of Present Illness   The patient, with a history of back pain, high cholesterol, and diabetes, presents for a follow-up consultation. The patient's primary concern is ongoing back pain, which is currently managed with Tylenol and Robaxin. The pain is tolerable when the patient is at rest but becomes bothersome when moving around. The patient has been attending physical therapy sessions, which are a prerequisite for potential further treatment options.  The patient also reports a recent fall due to balance issues, which is a new development. The fall did not result in any significant injuries but did exacerbate the back pain. The patient expresses concern about the balance issues and the potential for future falls.  The patient's high cholesterol and diabetes are being managed with Crestor and regular blood sugar monitoring, respectively. The patient reports that recent blood sugar readings have  been lower, with a recent reading of 111. The patient also mentions a recent increase in the dosage of Crestor following a consultation with a cardiologist.  The patient also reports occasional coughing, which has been less frequent in recent months. The patient is concerned about the potential for this to worsen with colder weather.        Past Medical History:  Diagnosis Date  . ALLERGIC RHINITIS   . Allergy   . Anxiety state, unspecified   . Arthritis   . Asthma   . BRONCHITIS, CHRONIC   . Cataract    b/l  . Chronic low back pain 10/09/2016  . COPD (chronic obstructive pulmonary disease) (HCC)   . DEPRESSION   . DIABETES MELLITUS, TYPE II   . GERD   . HYPERLIPIDEMIA   . HYPERTENSION   . Pneumonia   . Psoriasis     Past Surgical History:  Procedure Laterality Date  . ABDOMINAL HYSTERECTOMY  1980  . CARPAL TUNNEL RELEASE Right   . COLONOSCOPY    . TRIGGER FINGER RELEASE Left    Index finger  . UPPER GASTROINTESTINAL ENDOSCOPY      Family History  Problem Relation Age of Onset  . Stroke Mother   . Angina Mother   . Hyperlipidemia Mother   . Hypertension Mother   . Heart disease Mother   . Depression Mother   . Diabetes Father   . Hyperlipidemia Other        Parent  . Hypertension Other        Parent  . Diabetes Sister  x1  . CAD Sister   . COPD Sister        smoker  . Hyperlipidemia Sister   . Hypertension Sister   . Diabetes Brother        x 2  . Hyperlipidemia Brother   . Hypertension Brother   . Diabetes Brother   . Hyperlipidemia Brother   . Hypertension Brother   . CAD Sister   . Arthritis Sister   . Hyperlipidemia Sister   . Hypertension Sister   . Colon cancer Neg Hx   . Esophageal cancer Neg Hx   . Rectal cancer Neg Hx   . Stomach cancer Neg Hx   . Pancreatic cancer Neg Hx     Social History   Socioeconomic History  . Marital status: Married    Spouse name: Not on file  . Number of children: 0  . Years of education: 11yr  colge  . Highest education level: Some college, no degree  Occupational History  . Occupation: Retired   Tobacco Use  . Smoking status: Never    Passive exposure: Never  . Smokeless tobacco: Never  Vaping Use  . Vaping status: Never Used  Substance and Sexual Activity  . Alcohol use: No    Alcohol/week: 0.0 standard drinks of alcohol  . Drug use: No  . Sexual activity: Not Currently  Other Topics Concern  . Not on file  Social History Narrative   Married, lives with spouse-retired from [employer] insurance   Not employed    Drinks coffee occasional, Consumes 1 soda a day    No dietary restrictions   Social Drivers of Corporate investment banker Strain: Medium Risk (05/03/2023)   Overall Financial Resource Strain (CARDIA)   . Difficulty of Paying Living Expenses: Somewhat hard  Food Insecurity: Patient Declined (05/03/2023)   Hunger Vital Sign   . Worried About Programme researcher, broadcasting/film/video in the Last Year: Patient declined   . Ran Out of Food in the Last Year: Patient declined  Transportation Needs: No Transportation Needs (05/03/2023)   PRAPARE - Transportation   . Lack of Transportation (Medical): No   . Lack of Transportation (Non-Medical): No  Physical Activity: Inactive (05/03/2023)   Exercise Vital Sign   . Days of Exercise per Week: 0 days   . Minutes of Exercise per Session: 10 min  Stress: Stress Concern Present (05/03/2023)   [employer] of Occupational Health - Occupational Stress Questionnaire   . Feeling of Stress : To some extent  Social Connections: Unknown (05/03/2023)   Social Connection and Isolation Panel [NHANES]   . Frequency of Communication with Friends and Family: Twice a week   . Frequency of Social Gatherings with Friends and Family: Patient declined   . Attends Religious Services: More than 4 times per year   . Active Member of Clubs or Organizations: Yes   . Attends Banker Meetings: 1 to 4 times per year   . Marital Status: Married  Careers information officer Violence: Not At Risk (05/05/2023)   Humiliation, Afraid, Rape, and Kick questionnaire   . Fear of Current or Ex-Partner: No   . Emotionally Abused: No   . Physically Abused: No   . Sexually Abused: No    Outpatient Medications Prior to Visit  Medication Sig Dispense Refill  . acetaminophen (TYLENOL) 325 MG tablet Take 650 mg by mouth every 6 (six) hours as needed for mild pain or fever.    Marland Kitchen albuterol (VENTOLIN HFA) 108 (90  Base) MCG/ACT inhaler INHALE 1-2 PUFFS INTO THE LUNGS EVERY 6 (SIX) HOURS AS NEEDED FOR WHEEZING OR SHORTNESS OF BREATH. 8.5 each 3  . ALPRAZolam (XANAX) 0.5 MG tablet TAKE 1 TABLET BY MOUTH TWICE A DAY AS NEEDED FOR ANXIETY (Patient taking differently: Take 0.5 mg by mouth at bedtime as needed for anxiety or sleep.) 60 tablet 1  . amLODipine (NORVASC) 10 MG tablet TAKE 1 TABLET BY MOUTH EVERY DAY 90 tablet 0  . aspirin 81 MG chewable tablet Chew 81 mg by mouth daily.    . betamethasone dipropionate (DIPROLENE) 0.05 % ointment Apply 1 application  topically 2 (two) times daily.    . budesonide-formoterol (SYMBICORT) 160-4.5 MCG/ACT inhaler Inhale 2 puffs into the lungs 2 (two) times daily. 1 each 1  . Cholecalciferol (VITAMIN D3) 50 MCG (2000 UT) capsule Take 2,000 Units by mouth daily.    . famotidine (PEPCID) 40 MG tablet Take 1 tablet (40 mg total) by mouth daily. 90 tablet 0  . fluticasone (CUTIVATE) 0.05 % cream Apply 1 Application topically 2 (two) times daily.    . fluticasone (FLONASE) 50 MCG/ACT nasal spray SPRAY 2 SPRAYS INTO EACH NOSTRIL EVERY DAY (Patient taking differently: Place 2 sprays into both nostrils daily.) 48 mL 2  . glimepiride (AMARYL) 1 MG tablet TAKE 1 TABLET BY MOUTH EVERY DAY WITH BREAKFAST (Patient taking differently: Take 1 mg by mouth daily with breakfast.) 90 tablet 1  . glucose blood (ONE TOUCH ULTRA TEST) test strip 1 each by Other route daily. Use 1 strips to check blood sugar twice a day Ex E11.9 100 each 3  . HUMIRA PEN-PS/UV/ADOL  HS START 40 MG/0.8ML PNKT Inject 1 Dose into the skin every 14 (fourteen) days. Every 2 weeks    . isosorbide mononitrate (IMDUR) 30 MG 24 hr tablet Take 1 tablet (30 mg total) by mouth daily. 90 tablet 3  . ketoconazole (NIZORAL) 2 % cream Apply 1 Application topically daily as needed for irritation.    . Lancets (ONETOUCH ULTRASOFT) lancets PRN (Patient taking differently: 1 each by Other route as needed for other (glucose check). PRN) 100 each 3  . meloxicam (MOBIC) 15 MG tablet Take 1 tablet (15 mg total) by mouth daily. 60 tablet 0  . methocarbamol (ROBAXIN) 500 MG tablet Take 1 tablet (500 mg total) by mouth every 6 (six) hours as needed for muscle spasms. 40 tablet 0  . metoprolol succinate (TOPROL-XL) 50 MG 24 hr tablet Take 1 tablet (50 mg total) by mouth daily. Final attempt, patient needs and appt for additional refills 15 tablet 0  . mometasone (ELOCON) 0.1 % ointment Apply 1 application  topically daily.    . montelukast (SINGULAIR) 10 MG tablet TAKE 1 TABLET BY MOUTH EVERYDAY AT BEDTIME (Patient taking differently: Take 10 mg by mouth.) 90 tablet 1  . Multiple Vitamins-Minerals (CENTRUM SILVER PO) Take 1 tablet by mouth every morning.    . pantoprazole (PROTONIX) 40 MG tablet Take 1 tablet (40 mg total) by mouth 2 (two) times daily. 60 tablet 6  . Potassium Chloride ER 20 MEQ TBCR TAKE 1 TABLET (20 MEQ TOTAL) BY MOUTH DAILY. TAKE 1 TABLET BY MOUTH EVERY DAY 90 tablet 0  . rosuvastatin (CRESTOR) 40 MG tablet Take 1 tablet (40 mg total) by mouth daily. 90 tablet 3  . triamcinolone cream (KENALOG) 0.1 % Apply 1 Application topically 2 (two) times daily as needed (rash).    . valACYclovir (VALTREX) 500 MG tablet Take 500 mg by  mouth 2 (two) times daily.  0  . erythromycin ophthalmic ointment Place 1 Application into the left eye 2 (two) times daily. (Patient not taking: Reported on 11/18/2023)    . nitroGLYCERIN (NITROSTAT) 0.4 MG SL tablet Place 1 tablet (0.4 mg total) under the tongue  every 5 (five) minutes as needed. (Patient taking differently: Place 0.4 mg under the tongue every 5 (five) minutes as needed for chest pain.) 30 tablet 3   No facility-administered medications prior to visit.    Allergies  Allergen Reactions  . Metformin And Related Diarrhea  . Penicillins Rash    Review of Systems  Constitutional:  Negative for fever and malaise/fatigue.  HENT:  Negative for congestion.   Eyes:  Negative for blurred vision.  Respiratory:  Positive for cough. Negative for shortness of breath.   Cardiovascular:  Negative for chest pain, palpitations and leg swelling.  Gastrointestinal:  Negative for abdominal pain, blood in stool and nausea.  Genitourinary:  Negative for dysuria and frequency.  Musculoskeletal:  Positive for back pain and falls.  Skin:  Negative for rash.  Neurological:  Negative for dizziness, loss of consciousness and headaches.  Endo/Heme/Allergies:  Negative for environmental allergies.  Psychiatric/Behavioral:  Negative for depression. The patient is not nervous/anxious.        Objective:    Physical Exam Constitutional:      General: She is not in acute distress.    Appearance: Normal appearance. She is not ill-appearing or toxic-appearing.  HENT:     Head: Normocephalic and atraumatic.     Right Ear: External ear normal.     Left Ear: External ear normal.     Nose: Nose normal.  Eyes:     General:        Right eye: No discharge.        Left eye: No discharge.  Pulmonary:     Effort: Pulmonary effort is normal.  Skin:    Findings: No rash.  Neurological:     Mental Status: She is alert and oriented to person, place, and time.  Psychiatric:        Behavior: Behavior normal.   BP 116/72 (BP Location: Left Arm, Patient Position: Sitting)   Pulse 88   Ht 5' (1.524 m) Comment: Pt stated  Wt 147 lb (66.7 kg) Comment: Pt stated  SpO2 99%   BMI 28.71 kg/m  Wt Readings from Last 3 Encounters:  11/18/23 147 lb (66.7 kg)   11/07/23 147 lb (66.7 kg)  10/03/23 156 lb 6 oz (70.9 kg)       Assessment & Plan:  Chronic gout of right hand, unspecified cause Assessment & Plan: Hydrate and monitor  Orders: -     Uric acid; Future  Hyperlipidemia associated with type 2 diabetes mellitus (HCC) Assessment & Plan: Encourage heart healthy diet such as MIND or DASH diet, increase exercise, avoid trans fats, simple carbohydrates and processed foods, consider a krill or fish or flaxseed oil cap daily. Tolerating Rosuvastatin   Orders: -     Lipid panel; Future -     TSH; Future  Type 2 diabetes mellitus with hyperglycemia, without long-term current use of insulin (HCC) Assessment & Plan: hgba1c acceptable, minimize simple carbs. Increase exercise as tolerated. Continue current meds   Orders: -     Comprehensive metabolic panel; Future -     Hemoglobin A1c; Future -     Microalbumin / creatinine urine ratio; Future -     TSH; Future  Vitamin  D deficiency Assessment & Plan: Supplement and monitor   Orders: -     VITAMIN D 25 Hydroxy (Vit-D Deficiency, Fractures); Future  Lumbar radiculopathy Assessment & Plan: Encouraged moist heat and gentle stretching as tolerated. May try NSAIDs and prescription meds as directed and report if symptoms worsen or seek immediate care Following with neurosurgery   Orders: -     CBC with Differential/Platelet; Future  Coronary artery disease involving native coronary artery of native heart without angina pectoris Assessment & Plan: Doing well following with Cardiology  Orders: -     CBC with Differential/Platelet; Future -     TSH; Future     Assessment and Plan    Back Pain Currently undergoing physical therapy. Pain is manageable when at rest but increases with movement. Patient is taking Tylenol and occasionally Robaxin (Methocarbamol) for pain management. Discussed the possibility of trying Tizanidine (Zanaflex) as an alternative muscle relaxant. -Continue  physical therapy and report any worsening of symptoms to physical therapist. -Consider trying Tizanidine (Zanaflex) as an alternative to Robaxin for pain management. Do not take both muscle relaxants on the same day.  Hyperlipidemia Recent increase in Crestor dosage by cardiologist due to slightly elevated cholesterol levels. -Continue Crestor at increased dosage as prescribed by cardiologist.  Gout No recent flare-ups. -Continue current management.  Reflux and Cough No recent severe episodes, but patient reports occasional coughing. -Continue current management.  Balance Issues Recent fall during physical therapy session. No recurrent falls reported. -Continue physical therapy and implement slow, methodical movements to prevent future falls.  General Health Maintenance -Order comprehensive blood work panel to be done after December 15, 2023. This will include checks for sugar levels, Vitamin D, kidney function, uric acid, and other routine checks. -Schedule physical exam for six months from current date. Consider scheduling a three-month follow-up depending on results of blood work. -Forward blood work results to cardiologist in mid-February to coincide with patient's cholesterol check.         I discussed the assessment and treatment plan with the patient. The patient was provided an opportunity to ask questions and all were answered. The patient agreed with the plan and demonstrated an understanding of the instructions.   The patient was advised to call back or seek an in-person evaluation if the symptoms worsen or if the condition fails to improve as anticipated.  Danise Edge, MD [facility] [phone] (phone) [phone] (fax)  [facility]

## 2023-11-19 DIAGNOSIS — G8929 Other chronic pain: Secondary | ICD-10-CM | POA: Diagnosis not present

## 2023-11-19 DIAGNOSIS — R296 Repeated falls: Secondary | ICD-10-CM | POA: Diagnosis not present

## 2023-11-19 DIAGNOSIS — E119 Type 2 diabetes mellitus without complications: Secondary | ICD-10-CM | POA: Diagnosis not present

## 2023-11-19 DIAGNOSIS — R2689 Other abnormalities of gait and mobility: Secondary | ICD-10-CM | POA: Diagnosis not present

## 2023-11-21 DIAGNOSIS — R2689 Other abnormalities of gait and mobility: Secondary | ICD-10-CM | POA: Diagnosis not present

## 2023-11-21 DIAGNOSIS — R296 Repeated falls: Secondary | ICD-10-CM | POA: Diagnosis not present

## 2023-11-21 DIAGNOSIS — E119 Type 2 diabetes mellitus without complications: Secondary | ICD-10-CM | POA: Diagnosis not present

## 2023-11-21 DIAGNOSIS — G8929 Other chronic pain: Secondary | ICD-10-CM | POA: Diagnosis not present

## 2023-11-26 ENCOUNTER — Other Ambulatory Visit: Payer: Self-pay | Admitting: Family Medicine

## 2023-11-26 DIAGNOSIS — R296 Repeated falls: Secondary | ICD-10-CM | POA: Diagnosis not present

## 2023-11-26 DIAGNOSIS — R2689 Other abnormalities of gait and mobility: Secondary | ICD-10-CM | POA: Diagnosis not present

## 2023-11-26 DIAGNOSIS — G8929 Other chronic pain: Secondary | ICD-10-CM | POA: Diagnosis not present

## 2023-11-26 DIAGNOSIS — E119 Type 2 diabetes mellitus without complications: Secondary | ICD-10-CM | POA: Diagnosis not present

## 2023-12-02 ENCOUNTER — Ambulatory Visit (INDEPENDENT_AMBULATORY_CARE_PROVIDER_SITE_OTHER): Payer: Medicare HMO | Admitting: Neurosurgery

## 2023-12-02 DIAGNOSIS — R2689 Other abnormalities of gait and mobility: Secondary | ICD-10-CM | POA: Diagnosis not present

## 2023-12-02 DIAGNOSIS — E119 Type 2 diabetes mellitus without complications: Secondary | ICD-10-CM | POA: Diagnosis not present

## 2023-12-02 DIAGNOSIS — M533 Sacrococcygeal disorders, not elsewhere classified: Secondary | ICD-10-CM

## 2023-12-02 DIAGNOSIS — R296 Repeated falls: Secondary | ICD-10-CM | POA: Diagnosis not present

## 2023-12-02 DIAGNOSIS — G8929 Other chronic pain: Secondary | ICD-10-CM | POA: Diagnosis not present
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
## 2023-12-03 ENCOUNTER — Other Ambulatory Visit: Payer: Self-pay | Admitting: Cardiology

## 2023-12-04 DIAGNOSIS — E119 Type 2 diabetes mellitus without complications: Secondary | ICD-10-CM | POA: Diagnosis not present

## 2023-12-04 DIAGNOSIS — R2689 Other abnormalities of gait and mobility: Secondary | ICD-10-CM | POA: Diagnosis not present

## 2023-12-04 DIAGNOSIS — G8929 Other chronic pain: Secondary | ICD-10-CM | POA: Diagnosis not present

## 2023-12-04 DIAGNOSIS — R296 Repeated falls: Secondary | ICD-10-CM | POA: Diagnosis not present

## 2023-12-09 ENCOUNTER — Encounter: Payer: Self-pay | Admitting: Family Medicine

## 2023-12-09 DIAGNOSIS — G8929 Other chronic pain: Secondary | ICD-10-CM | POA: Diagnosis not present

## 2023-12-09 DIAGNOSIS — R296 Repeated falls: Secondary | ICD-10-CM | POA: Diagnosis not present

## 2023-12-09 DIAGNOSIS — E119 Type 2 diabetes mellitus without complications: Secondary | ICD-10-CM | POA: Diagnosis not present

## 2023-12-09 DIAGNOSIS — R2689 Other abnormalities of gait and mobility: Secondary | ICD-10-CM | POA: Diagnosis not present

## 2023-12-10 ENCOUNTER — Other Ambulatory Visit: Payer: Self-pay | Admitting: Family Medicine

## 2023-12-10 DIAGNOSIS — R490 Dysphonia: Secondary | ICD-10-CM

## 2023-12-12 DIAGNOSIS — R2689 Other abnormalities of gait and mobility: Secondary | ICD-10-CM | POA: Diagnosis not present

## 2023-12-12 DIAGNOSIS — R296 Repeated falls: Secondary | ICD-10-CM | POA: Diagnosis not present

## 2023-12-12 DIAGNOSIS — G8929 Other chronic pain: Secondary | ICD-10-CM | POA: Diagnosis not present

## 2023-12-12 DIAGNOSIS — E119 Type 2 diabetes mellitus without complications: Secondary | ICD-10-CM | POA: Diagnosis not present

## 2023-12-15 ENCOUNTER — Encounter: Payer: Self-pay | Admitting: Family Medicine

## 2023-12-15 ENCOUNTER — Other Ambulatory Visit (INDEPENDENT_AMBULATORY_CARE_PROVIDER_SITE_OTHER): Payer: Medicare HMO

## 2023-12-15 DIAGNOSIS — E785 Hyperlipidemia, unspecified: Secondary | ICD-10-CM | POA: Diagnosis not present

## 2023-12-15 DIAGNOSIS — E1165 Type 2 diabetes mellitus with hyperglycemia: Secondary | ICD-10-CM | POA: Diagnosis not present

## 2023-12-15 DIAGNOSIS — I2510 Atherosclerotic heart disease of native coronary artery without angina pectoris: Secondary | ICD-10-CM

## 2023-12-15 DIAGNOSIS — M5416 Radiculopathy, lumbar region: Secondary | ICD-10-CM

## 2023-12-15 DIAGNOSIS — M1A0410 Idiopathic chronic gout, right hand, without tophus (tophi): Secondary | ICD-10-CM

## 2023-12-15 DIAGNOSIS — E1169 Type 2 diabetes mellitus with other specified complication: Secondary | ICD-10-CM | POA: Diagnosis not present

## 2023-12-15 DIAGNOSIS — E559 Vitamin D deficiency, unspecified: Secondary | ICD-10-CM

## 2023-12-15 LAB — COMPREHENSIVE METABOLIC PANEL
ALT: 20 U/L (ref 0–35)
AST: 23 U/L (ref 0–37)
Albumin: 4 g/dL (ref 3.5–5.2)
Alkaline Phosphatase: 51 U/L (ref 39–117)
BUN: 11 mg/dL (ref 6–23)
CO2: 28 meq/L (ref 19–32)
Calcium: 9.2 mg/dL (ref 8.4–10.5)
Chloride: 106 meq/L (ref 96–112)
Creatinine, Ser: 0.89 mg/dL (ref 0.40–1.20)
GFR: 61.95 mL/min (ref >60.00)
Glucose, Bld: 135 mg/dL — ABNORMAL HIGH (ref 70–99)
Potassium: 3.3 meq/L — ABNORMAL LOW (ref 3.5–5.1)
Sodium: 144 meq/L (ref 135–145)
Total Bilirubin: 0.4 mg/dL (ref 0.2–1.2)
Total Protein: 7.1 g/dL (ref 6.0–8.3)

## 2023-12-15 LAB — LIPID PANEL
Cholesterol: 132 mg/dL (ref 0–200)
HDL: 42 mg/dL (ref >39.00)
LDL Cholesterol: 65 mg/dL (ref 0–99)
NonHDL: 89.5
Total CHOL/HDL Ratio: 3
Triglycerides: 122 mg/dL (ref 0.0–149.0)
VLDL: 24.4 mg/dL (ref 0.0–40.0)

## 2023-12-15 LAB — CBC WITH DIFFERENTIAL/PLATELET
Basophils Absolute: 0 10*3/uL (ref 0.0–0.1)
Basophils Relative: 0.5 % (ref 0.0–3.0)
Eosinophils Absolute: 0.1 10*3/uL (ref 0.0–0.7)
Eosinophils Relative: 1.1 % (ref 0.0–5.0)
HCT: 36.6 % (ref 36.0–46.0)
Hemoglobin: 12.2 g/dL (ref 12.0–15.0)
Lymphocytes Relative: 41.6 % (ref 12.0–46.0)
Lymphs Abs: 3 10*3/uL (ref 0.7–4.0)
MCHC: 33.3 g/dL (ref 30.0–36.0)
MCV: 96.1 fL (ref 78.0–100.0)
Monocytes Absolute: 0.5 10*3/uL (ref 0.1–1.0)
Monocytes Relative: 7.5 % (ref 3.0–12.0)
Neutro Abs: 3.5 10*3/uL (ref 1.4–7.7)
Neutrophils Relative %: 49.3 % (ref 43.0–77.0)
Platelets: 257 10*3/uL (ref 150.0–400.0)
RBC: 3.81 Mil/uL — ABNORMAL LOW (ref 3.87–5.11)
RDW: 13.4 % (ref 11.5–15.5)
WBC: 7.1 10*3/uL (ref 4.0–10.5)

## 2023-12-15 LAB — TSH: TSH: 4.24 u[IU]/mL (ref 0.35–5.50)

## 2023-12-15 LAB — MICROALBUMIN / CREATININE URINE RATIO
Creatinine,U: 355.5 mg/dL
Microalb Creat Ratio: 22.7 mg/g (ref 0.0–30.0)
Microalb, Ur: 8.1 mg/dL — ABNORMAL HIGH (ref 0.0–1.9)

## 2023-12-15 LAB — HEMOGLOBIN A1C: Hgb A1c MFr Bld: 6.5 % (ref 4.6–6.5)

## 2023-12-15 LAB — URIC ACID: Uric Acid, Serum: 4.4 mg/dL (ref 2.4–7.0)

## 2023-12-15 LAB — VITAMIN D 25 HYDROXY (VIT D DEFICIENCY, FRACTURES): VITD: 71.61 ng/mL (ref 30.00–100.00)

## 2023-12-16 DIAGNOSIS — R296 Repeated falls: Secondary | ICD-10-CM | POA: Diagnosis not present

## 2023-12-16 DIAGNOSIS — G8929 Other chronic pain: Secondary | ICD-10-CM | POA: Diagnosis not present

## 2023-12-16 DIAGNOSIS — R2689 Other abnormalities of gait and mobility: Secondary | ICD-10-CM | POA: Diagnosis not present

## 2023-12-16 DIAGNOSIS — E119 Type 2 diabetes mellitus without complications: Secondary | ICD-10-CM | POA: Diagnosis not present

## 2023-12-22 ENCOUNTER — Other Ambulatory Visit: Payer: Self-pay | Admitting: *Deleted

## 2023-12-22 ENCOUNTER — Ambulatory Visit: Payer: Self-pay | Admitting: Family Medicine

## 2023-12-22 DIAGNOSIS — E876 Hypokalemia: Secondary | ICD-10-CM

## 2023-12-22 NOTE — Telephone Encounter (Signed)
  Chief Complaint: knot above right ankle Symptoms: tender to tocuh Frequency: ongoing since yesterday  Pertinent Negatives: Patient denies redness or fever Disposition: [] ED /[] Urgent Care (no appt availability in office) / [x] Appointment(In office/virtual)/ []  [facility]/ [] Home Care/ [] Refused Recommended Disposition /[] [facility]/ []  Follow-up with PCP Additional Notes: The patient reported a knot above her right ankle that is tender to gentle touch.  She denied redness.  She said it is like a round jelly ball slightly bigger than a quarter.  It was larger yesterday.  It is sore with touch, 5/10.  She noticed it yesterday 12/21/23.  She was scheduled for a next day appointment for further evaluation. Reason for Disposition  Looks like a boil, infected sore, deep ulcer or other infected rash (spreading redness, pus)  Answer Assessment - Initial Assessment Questions 1. LOCATION: "Which ankle is swollen?" "Where is the swelling?"     Knot right above her right ankle  2. ONSET: "When did the swelling start?"     Noticed it yesterday;  3. SWELLING: "How bad is the swelling?" Or, "How large is it?" (e.g., mild, moderate, severe; size of localized swelling)    - NONE: No joint swelling.   - LOCALIZED: Localized; small area of puffy or swollen skin (e.g., insect bite, skin irritation).   - MILD: Joint looks or feels mildly swollen or puffy.   - MODERATE: Swollen; interferes with normal activities (e.g., work or school); decreased range of movement; may be limping.   - SEVERE: Very swollen; can't move swollen joint at all; limping a lot or unable to walk.     Small knot not as large as yesterday Yesterday size of a 50 cent piece  4. PAIN: "Is there any pain?" If Yes, ask: "How bad is it?" (Scale 1-10; or mild, moderate, severe)   - NONE (0): no pain.   - MILD (1-3): doesn't interfere with normal activities.    - MODERATE (4-7): interferes with normal activities (e.g.,  work or school) or awakens from sleep, limping.    - SEVERE (8-10): excruciating pain, unable to do any normal activities, unable to walk.      5/10 with touch  5. CAUSE: "What do you think caused the ankle swelling?"     Unsure  6. OTHER SYMPTOMS: "Do you have any other symptoms?" (e.g., fever, chest pain, difficulty breathing, calf pain)     Knee chronic pain ongoing  Protocols used: Ankle Swelling-A-AH

## 2023-12-22 NOTE — Telephone Encounter (Signed)
 Pt scheduled 12/23/23 with lindsey

## 2023-12-23 ENCOUNTER — Other Ambulatory Visit (INDEPENDENT_AMBULATORY_CARE_PROVIDER_SITE_OTHER): Payer: Medicare HMO

## 2023-12-23 ENCOUNTER — Encounter: Payer: Self-pay | Admitting: Physician Assistant

## 2023-12-23 ENCOUNTER — Ambulatory Visit: Payer: Medicare HMO

## 2023-12-23 ENCOUNTER — Encounter: Payer: Self-pay | Admitting: Family Medicine

## 2023-12-23 ENCOUNTER — Ambulatory Visit (INDEPENDENT_AMBULATORY_CARE_PROVIDER_SITE_OTHER): Payer: Medicare HMO | Admitting: Physician Assistant

## 2023-12-23 VITALS — BP 124/72 | HR 88 | Temp 98.0°F | Resp 18 | Ht 60.0 in | Wt 149.8 lb

## 2023-12-23 DIAGNOSIS — R2241 Localized swelling, mass and lump, right lower limb: Secondary | ICD-10-CM

## 2023-12-23 DIAGNOSIS — E876 Hypokalemia: Secondary | ICD-10-CM

## 2023-12-23 DIAGNOSIS — R600 Localized edema: Secondary | ICD-10-CM | POA: Diagnosis not present

## 2023-12-23 LAB — COMPREHENSIVE METABOLIC PANEL
ALT: 16 U/L (ref 0–35)
AST: 20 U/L (ref 0–37)
Albumin: 4.1 g/dL (ref 3.5–5.2)
Alkaline Phosphatase: 50 U/L (ref 39–117)
BUN: 15 mg/dL (ref 6–23)
CO2: 27 meq/L (ref 19–32)
Calcium: 9.5 mg/dL (ref 8.4–10.5)
Chloride: 105 meq/L (ref 96–112)
Creatinine, Ser: 0.92 mg/dL (ref 0.40–1.20)
GFR: 59.53 mL/min — ABNORMAL LOW (ref >60.00)
Glucose, Bld: 132 mg/dL — ABNORMAL HIGH (ref 70–99)
Potassium: 3.6 meq/L (ref 3.5–5.1)
Sodium: 142 meq/L (ref 135–145)
Total Bilirubin: 0.3 mg/dL (ref 0.2–1.2)
Total Protein: 7.3 g/dL (ref 6.0–8.3)

## 2023-12-23 NOTE — Progress Notes (Signed)
 Established patient visit   [name]   DOB: 08-Oct-1946   78 y.o. Female  MRN: [id number] Visit Date: 12/23/2023  Today's healthcare provider: Alfredia Ferguson, PA-C   Chief Complaint  Patient presents with   skin check    Boil right ankle onset: 3 days   Subjective    Pt reports a knot/lump that appeared on her right lower leg x3 days ago. Initially was larger and has shrunk some. Tender to touch but not very painful. Denies any knowledge of injury.    Medications: Outpatient Medications Prior to Visit  Medication Sig   acetaminophen (TYLENOL) 325 MG tablet Take 650 mg by mouth every 6 (six) hours as needed for mild pain or fever.   albuterol (VENTOLIN HFA) 108 (90 Base) MCG/ACT inhaler INHALE 1-2 PUFFS INTO THE LUNGS EVERY 6 (SIX) HOURS AS NEEDED FOR WHEEZING OR SHORTNESS OF BREATH.   ALPRAZolam (XANAX) 0.5 MG tablet TAKE 1 TABLET BY MOUTH TWICE A DAY AS NEEDED FOR ANXIETY (Patient taking differently: Take 0.5 mg by mouth at bedtime as needed for anxiety or sleep.)   amLODipine (NORVASC) 10 MG tablet TAKE 1 TABLET BY MOUTH EVERY DAY   aspirin 81 MG chewable tablet Chew 81 mg by mouth daily.   betamethasone dipropionate (DIPROLENE) 0.05 % ointment Apply 1 application  topically 2 (two) times daily.   budesonide-formoterol (SYMBICORT) 160-4.5 MCG/ACT inhaler Inhale 2 puffs into the lungs 2 (two) times daily.   Cholecalciferol (VITAMIN D3) 50 MCG (2000 UT) capsule Take 2,000 Units by mouth daily.   erythromycin ophthalmic ointment Place 1 Application into the left eye 2 (two) times daily. (Patient not taking: Reported on 11/18/2023)   famotidine (PEPCID) 40 MG tablet Take 1 tablet (40 mg total) by mouth daily.   fluticasone (CUTIVATE) 0.05 % cream Apply 1 Application topically 2 (two) times daily.   fluticasone (FLONASE) 50 MCG/ACT nasal spray SPRAY 2 SPRAYS INTO EACH NOSTRIL EVERY DAY (Patient taking differently: Place 2 sprays into both nostrils daily.)    glimepiride (AMARYL) 1 MG tablet TAKE 1 TABLET BY MOUTH EVERY DAY WITH BREAKFAST (Patient taking differently: Take 1 mg by mouth daily with breakfast.)   glucose blood (ONE TOUCH ULTRA TEST) test strip 1 each by Other route daily. Use 1 strips to check blood sugar twice a day Ex E11.9   HUMIRA PEN-PS/UV/ADOL HS START 40 MG/0.8ML PNKT Inject 1 Dose into the skin every 14 (fourteen) days. Every 2 weeks   isosorbide mononitrate (IMDUR) 30 MG 24 hr tablet Take 1 tablet (30 mg total) by mouth daily.   ketoconazole (NIZORAL) 2 % cream Apply 1 Application topically daily as needed for irritation.   Lancets (ONETOUCH ULTRASOFT) lancets PRN (Patient taking differently: 1 each by Other route as needed for other (glucose check). PRN)   meloxicam (MOBIC) 15 MG tablet Take 1 tablet (15 mg total) by mouth daily.   methocarbamol (ROBAXIN) 500 MG tablet Take 1 tablet (500 mg total) by mouth every 6 (six) hours as needed for muscle spasms.   metoprolol succinate (TOPROL-XL) 50 MG 24 hr tablet Take 1 tablet (50 mg total) by mouth daily.   mometasone (ELOCON) 0.1 % ointment Apply 1 application  topically daily.   montelukast (SINGULAIR) 10 MG tablet TAKE 1 TABLET BY MOUTH EVERYDAY AT BEDTIME (Patient taking differently: Take 10 mg by mouth.)   Multiple Vitamins-Minerals (CENTRUM SILVER PO) Take 1 tablet by mouth every morning.   nitroGLYCERIN (NITROSTAT) 0.4 MG SL  tablet Place 1 tablet (0.4 mg total) under the tongue every 5 (five) minutes as needed. (Patient taking differently: Place 0.4 mg under the tongue every 5 (five) minutes as needed for chest pain.)   pantoprazole (PROTONIX) 40 MG tablet Take 1 tablet (40 mg total) by mouth 2 (two) times daily.   Potassium Chloride ER 20 MEQ TBCR TAKE 1 TABLET (20 MEQ TOTAL) BY MOUTH DAILY. TAKE 1 TABLET BY MOUTH EVERY DAY   rosuvastatin (CRESTOR) 40 MG tablet Take 1 tablet (40 mg total) by mouth daily.   triamcinolone cream (KENALOG) 0.1 % Apply 1 Application topically 2 (two)  times daily as needed (rash).   valACYclovir (VALTREX) 500 MG tablet Take 500 mg by mouth 2 (two) times daily.   No facility-administered medications prior to visit.    Review of Systems  Constitutional:  Negative for fatigue and fever.  Respiratory:  Negative for cough and shortness of breath.   Cardiovascular:  Negative for chest pain and leg swelling.  Gastrointestinal:  Negative for abdominal pain.  Neurological:  Negative for dizziness and headaches.       Objective    BP 124/72   Pulse 88   Temp 98 F (36.7 C)   Resp 18   Ht 5' (1.524 m)   Wt 149 lb 12.8 oz (67.9 kg)   SpO2 100%   BMI 29.26 kg/m    Physical Exam Vitals reviewed.  Constitutional:      Appearance: She is not ill-appearing.  HENT:     Head: Normocephalic.  Eyes:     Conjunctiva/sclera: Conjunctivae normal.  Cardiovascular:     Rate and Rhythm: Normal rate.  Pulmonary:     Effort: Pulmonary effort is normal. No respiratory distress.  Skin:    Comments: Right lateral/ankle shin there is a 1-2 firm, well circumscribed mass with some give. Nonmobile, no associated edema, erythema, rash.   Rest of LE without any venous congestion, erythema. Appropriate warmth.   Neurological:     Mental Status: She is alert and oriented to person, place, and time.  Psychiatric:        Mood and Affect: Mood normal.        Behavior: Behavior normal.      No results found for any visits on 12/23/23.  Assessment & Plan    Mass of right lower extremity -     US SOFT TISSUE LOWER EXTREMITY LIMITED RIGHT (NON-VASCULAR); Future  Advised pt to ice, ordered US to determine what mass is.   Return if symptoms worsen or fail to improve.       Alfredia Ferguson, PA-C  [facility] [phone] (phone) [phone] (fax)  [facility]

## 2023-12-30 ENCOUNTER — Telehealth: Payer: Self-pay | Admitting: Neurosurgery

## 2023-12-30 DIAGNOSIS — E119 Type 2 diabetes mellitus without complications: Secondary | ICD-10-CM | POA: Diagnosis not present

## 2023-12-30 DIAGNOSIS — R2689 Other abnormalities of gait and mobility: Secondary | ICD-10-CM | POA: Diagnosis not present

## 2023-12-30 DIAGNOSIS — M533 Sacrococcygeal disorders, not elsewhere classified: Secondary | ICD-10-CM

## 2023-12-30 DIAGNOSIS — G8929 Other chronic pain: Secondary | ICD-10-CM | POA: Diagnosis not present

## 2023-12-30 DIAGNOSIS — R296 Repeated falls: Secondary | ICD-10-CM | POA: Diagnosis not present

## 2023-12-30 NOTE — Telephone Encounter (Signed)
 Looks like Dr Lucienne Capers last note mentions SI joint injections

## 2023-12-30 NOTE — Telephone Encounter (Signed)
 Patient called in stating that she has been going to physical therapy but has not had much relief from it. Today she tried dry needling at her session. She will call back in 2-3 days to see if that helps but so far not so good. She would like to know what else to dry if the outcome of dry needling does not work

## 2023-12-31 NOTE — Telephone Encounter (Signed)
 I spoke with the patient. She tried dry needling this week and it helped that day. She has another appointment tomorrow. She is going to discuss this with the physical therapist to determine how often and for how long she should do dry needling. I discussed that Dr Lucienne Capers note mentions that she could also try SI joint injections. She will discuss this with the physical therapist and call back if she decides she would like a referral for SI joint injections.

## 2024-01-01 DIAGNOSIS — E119 Type 2 diabetes mellitus without complications: Secondary | ICD-10-CM | POA: Diagnosis not present

## 2024-01-01 DIAGNOSIS — G8929 Other chronic pain: Secondary | ICD-10-CM | POA: Diagnosis not present

## 2024-01-01 DIAGNOSIS — R2689 Other abnormalities of gait and mobility: Secondary | ICD-10-CM | POA: Diagnosis not present

## 2024-01-01 DIAGNOSIS — R296 Repeated falls: Secondary | ICD-10-CM | POA: Diagnosis not present

## 2024-01-06 ENCOUNTER — Telehealth: Payer: Self-pay | Admitting: Neurology

## 2024-01-06 NOTE — Telephone Encounter (Signed)
 Copied from CRM [id number]. Topic: Clinical - Lab/Test Results >> Jan 06, 2024  8:18 AM Desma Mcgregor wrote: Reason for CRM: Pt has additional questions after receiving the message about the results being inaccurate about her kidneys. Please contact pt

## 2024-01-06 NOTE — Telephone Encounter (Signed)
 Called and spoke with patient. I let her know her name was not on the list of patients affected by the glitch in the system. Patient was relieved.

## 2024-01-07 DIAGNOSIS — G8929 Other chronic pain: Secondary | ICD-10-CM | POA: Diagnosis not present

## 2024-01-07 DIAGNOSIS — R2689 Other abnormalities of gait and mobility: Secondary | ICD-10-CM | POA: Diagnosis not present

## 2024-01-07 DIAGNOSIS — R296 Repeated falls: Secondary | ICD-10-CM | POA: Diagnosis not present

## 2024-01-07 DIAGNOSIS — E119 Type 2 diabetes mellitus without complications: Secondary | ICD-10-CM | POA: Diagnosis not present

## 2024-01-08 ENCOUNTER — Other Ambulatory Visit: Payer: Self-pay | Admitting: Family Medicine

## 2024-01-08 NOTE — Telephone Encounter (Signed)
 I have placed a referral for her to see Clementeen Graham. Please send the referral.

## 2024-01-08 NOTE — Addendum Note (Signed)
 Addended by: Ernie Hew on: 01/08/2024 04:59 PM   Modules accepted: Orders

## 2024-01-08 NOTE — Telephone Encounter (Signed)
 She is calling back and wants to move forward with the referral for SI injection. Is there an office in [place] that she can go to?

## 2024-01-12 ENCOUNTER — Encounter: Payer: Self-pay | Admitting: Physician Assistant

## 2024-01-15 NOTE — Progress Notes (Unsigned)
   Rubin Payor, PhD, LAT, ATC acting as a scribe for Clementeen Graham, MD.  Heidi Franklin is a 78 y.o. female who presents to [facility] today for cont'd LBP. Pt was last seen by Dr. Jean Rosenthal on 09/17/23 and has been seen by Dr. Myer Haff at neurosurgery. Last lumbar ESI, 07/10/23, at L4-5.  Today, pt reports ***. Pt locates pain to ***  Dx imaging: 06/25/23 L-spine CT myelogram  05/15/23 L-spine XR  Pertinent review of systems: ***  Relevant historical information: ***   Exam:  There were no vitals taken for this visit. General: Well Developed, well nourished, and in no acute distress.   MSK: ***    Lab and Radiology Results No results found. However, due to the size of the patient record, not all encounters were searched. Please check Results Review for a complete set of results. No results found.     Assessment and Plan: 78 y.o. female with ***   PDMP not reviewed this encounter. No orders of the defined types were placed in this encounter.  No orders of the defined types were placed in this encounter.    Discussed warning signs or symptoms. Please see discharge instructions. Patient expresses understanding.   ***

## 2024-01-16 ENCOUNTER — Other Ambulatory Visit: Payer: Self-pay

## 2024-01-16 ENCOUNTER — Encounter: Payer: Self-pay | Admitting: Family Medicine

## 2024-01-16 ENCOUNTER — Ambulatory Visit: Admitting: Family Medicine

## 2024-01-16 VITALS — BP 122/78 | HR 84 | Ht 60.0 in

## 2024-01-16 DIAGNOSIS — M533 Sacrococcygeal disorders, not elsewhere classified: Secondary | ICD-10-CM | POA: Diagnosis not present

## 2024-01-16 DIAGNOSIS — G8929 Other chronic pain: Secondary | ICD-10-CM

## 2024-01-16 DIAGNOSIS — M545 Low back pain, unspecified: Secondary | ICD-10-CM

## 2024-01-16 NOTE — Patient Instructions (Addendum)
 Thank you for coming in today.   You received an injection today. Seek immediate medical attention if the joint becomes red, extremely painful, or is oozing fluid.

## 2024-01-17 ENCOUNTER — Other Ambulatory Visit: Payer: Self-pay | Admitting: Family Medicine

## 2024-01-20 ENCOUNTER — Telehealth: Payer: Self-pay | Admitting: Emergency Medicine

## 2024-01-20 NOTE — Telephone Encounter (Signed)
 Called Heidi Franklin back from [facility]. No answer. Left voicemail stating paperwork has been faxed.

## 2024-01-20 NOTE — Telephone Encounter (Signed)
 Copied from CRM [id number]. Topic: General - Other >> Jan 20, 2024 10:38 AM Truddie Crumble wrote: Reason for CRM: michael from [facility] called stating he is checking on diabetic supplies request that was faxed for the patient on 3/19 CB [phone]

## 2024-01-22 ENCOUNTER — Other Ambulatory Visit: Payer: Self-pay | Admitting: Family

## 2024-01-22 ENCOUNTER — Encounter: Payer: Self-pay | Admitting: Family Medicine

## 2024-01-22 ENCOUNTER — Telehealth: Payer: Self-pay | Admitting: Family Medicine

## 2024-01-22 MED ORDER — METHYLPREDNISOLONE 4 MG PO TBPK: ORAL_TABLET | ORAL | 0 refills | Status: DC

## 2024-01-22 NOTE — Telephone Encounter (Signed)
 Patient called to state that so far she has not felt much of a difference after her injection. She is trying to find out if she should be feeling something right now. She does not know how long it should take before it starts working but she is still having problems so she wanted to check in and see with Dr. Denyse Amass.

## 2024-01-23 NOTE — Telephone Encounter (Signed)
 Pt received SI joint injection 01/16/23.   Forwarding to Dr. Denyse Amass to review and advise.

## 2024-01-23 NOTE — Telephone Encounter (Signed)
 If the SI joint injection did not help enough we may want to consider facet injections.  I will also send this note to Dr. Marcell Barlow.  I am happy to order the facet injections to get started on potential next steps.

## 2024-01-25 ENCOUNTER — Other Ambulatory Visit: Payer: Self-pay | Admitting: Family Medicine

## 2024-02-02 ENCOUNTER — Ambulatory Visit (INDEPENDENT_AMBULATORY_CARE_PROVIDER_SITE_OTHER): Payer: Medicare HMO | Admitting: Otolaryngology

## 2024-02-02 ENCOUNTER — Encounter (INDEPENDENT_AMBULATORY_CARE_PROVIDER_SITE_OTHER): Payer: Self-pay | Admitting: Otolaryngology

## 2024-02-02 VITALS — BP 124/67 | HR 85 | Ht 60.0 in | Wt 149.0 lb

## 2024-02-02 DIAGNOSIS — R0981 Nasal congestion: Secondary | ICD-10-CM | POA: Diagnosis not present

## 2024-02-02 DIAGNOSIS — R490 Dysphonia: Secondary | ICD-10-CM | POA: Diagnosis not present

## 2024-02-02 DIAGNOSIS — R0982 Postnasal drip: Secondary | ICD-10-CM | POA: Diagnosis not present

## 2024-02-02 DIAGNOSIS — J383 Other diseases of vocal cords: Secondary | ICD-10-CM

## 2024-02-02 DIAGNOSIS — J3089 Other allergic rhinitis: Secondary | ICD-10-CM

## 2024-02-02 DIAGNOSIS — R053 Chronic cough: Secondary | ICD-10-CM

## 2024-02-02 DIAGNOSIS — J342 Deviated nasal septum: Secondary | ICD-10-CM

## 2024-02-02 DIAGNOSIS — K219 Gastro-esophageal reflux disease without esophagitis: Secondary | ICD-10-CM

## 2024-02-02 DIAGNOSIS — J343 Hypertrophy of nasal turbinates: Secondary | ICD-10-CM

## 2024-02-02 MED ORDER — CETIRIZINE HCL 10 MG PO TABS: 10.0000 mg | ORAL_TABLET | Freq: Every day | ORAL | 11 refills | Status: AC

## 2024-02-02 MED ORDER — FLUTICASONE PROPIONATE 50 MCG/ACT NA SUSP: 2.0000 | Freq: Two times a day (BID) | NASAL | 6 refills | Status: DC

## 2024-02-02 MED ORDER — FLUTICASONE PROPIONATE 50 MCG/ACT NA SUSP: 2.0000 | Freq: Two times a day (BID) | NASAL | 6 refills | Status: AC

## 2024-02-02 NOTE — Patient Instructions (Addendum)
 [email or web address] - check out this website to learn more about reflux   -Avoid lying down for at least two hours after a meal or after drinking acidic beverages, like soda, or other caffeinated beverages. This can help to prevent stomach contents from flowing back into the esophagus. -Keep your head elevated while you sleep. Using an extra pillow or two can also help to prevent reflux. -Eat smaller and more frequent meals each day instead of a few large meals. This promotes digestion and can aid in preventing heartburn. -Wear loose-fitting clothes to ease pressure on the stomach, which can worsen heartburn and reflux. -Reduce excess weight around the midsection. This can ease pressure on the stomach. Such pressure can force some stomach contents back up the esophagus  - Take Reflux Gourmet (natural supplement available on Amazon) to help with symptoms of chronic throat irritation    Lloyd Huger Med Nasal Saline Rinse   - start nasal saline rinses with NeilMed Bottle available over the counter or online to help with nasal congestion

## 2024-02-02 NOTE — Progress Notes (Signed)
 ENT CONSULT:  Reason for Consult: chronic hoarseness and chronic cough x 5 months   HPI: Discussed the use of AI scribe software for clinical note transcription with the patient, who gave verbal consent to proceed.  History of Present Illness Heidi Franklin is a 78 year old female with hx of GERD, environmental allergies and chronic cough, previously seen by GI and Pulm, who presents with voice changes/raspy voice and chronic cough.  She has experienced voice changes for approximately four to five months, describing her voice as hoarse. The hoarseness is intermittent and worsens when she goes outside. Despite the duration, there has been no improvement in her symptoms, which fluctuate in severity.  She has a constant dry cough with a 'tickle' in her throat that triggers coughing fits. The cough is less frequent than before, following a change in her blood pressure medication, which was initially associated with causing the cough. No trouble with swallowing or coughing while eating or drinking, though she notes irritation when drinking cold beverages. She has never been a smoker.  She has a long history of bronchial problems and environmental allergies, previously managed with allergy shots, though it has been years since her last treatment. Currently, she manages her allergies with Zyrtec, Mucinex, and occasionally Flonase, but she does not use nasal sprays daily. She has not seen an allergist recently and does not have a current allergy doctor.  Her current medications for GERD include PPI and Pepcid. She takes Pepcid for reflux, which was suggested as a potential cause of her cough. She was evaluated by a pulmonologist who considered cough variant asthma and noted pulmonary nodules, but she missed her last f/u appt in October, 2024.  She has multiple health issues, including hip problems and psoriasis. She has not experienced any strokes or heart attacks.     Records Reviewed:  Office visit  with Dr Judeth Horn 04/24/23 seen for chroinic cough  78 y.o. whom we are seeing in for acute visit for evaluation of chronic cough.  Most recent PCP note reviewed.  Multiple telephone notes from pulmonary clinic reviewed.    Cough recurred.  Intermittently gets better with steroids at times.  Steroid taper called in earlier this month via provider of the day.  Says it helps after a few days cough is back.  She complains of irritation in the back of her throat, oropharynx.  She complains of voice changes or hoarseness.  We discussed at length that given her reflux I think this is a significant contributor to her cough.  She does wheeze at night she states.  Asthma certainly is possible but she is on aggressive inhaler therapy.  I think prednisone helped because it decreases oropharyngeal inflammation related to reflux.  But we discussed escalation of biologic therapy in the future as a empiric trial to treat possible cough variant asthma.   Acute on chronic cough: Suspect multifactorial however most likely her largest contributor suspected to be reflux.  She has breakthrough reflux and heartburn symptoms despite daily PPI.  She has hoarse voice.  Bilateral lower lobe bronchiectasis likely sequela of chronic silent aspiration.  She has mechanical reason for lack of gastric emptying.  Concern for possible contribution of asthma given her longstanding history of atopic symptoms as well as "chronic bronchitis" as a child.  Seems prednisone helped from time to time.  However, she had a negative methacholine challenge in the past, however this is not very specific and not a good test for cough variant asthma.  Continue high-dose Symbicort.  Can trial Breztri in the future if needed.  Prior chest imaging clear.  Responded well to neuropathic treatment in the past but with side effects intolerable not a good option moving forward.  Repeat prednisone taper today.  Chest exam is clear.  Upcoming GI for further evaluation of  reflux - she has oropharyngeal irritation and hoarseness that does not fit with asthma.  Consider Biologics in the future if GI intervention does not yield improved symptoms. If fails biologics then definitely NOT asthma as primary driver of symptoms    Multiple notes in the system from [facility] for upper airway cough sy, bronchiectasis.   Office visit with Dr Marina Goodell GI 05/06/23 Heidi Franklin is a 78 y.o. female with multiple medical problems as listed below.  She is sent today at the urging of her pulmonologist, Dr. Judeth Horn, regarding worsening chronic cough possibly related to GERD.  The patient was last seen in this office September 25, 2022 regarding positive FIT testing.  See that dictation.  She subsequently underwent colonoscopy February 2024.  She was found to have a diminutive adenoma which was removed.  The exam was otherwise normal.  No future surveillance recommended.   Patient tells me that she has had problems with cough since she was a child.  She reports recurrent cases of bronchitis.  Feels that her current issues with cough that worsened over the past 6 months.  She has seen her PCP as well as pulmonary.  Pulmonary feels her cough is multifactorial.  She does have known GERD.  She did undergo upper endoscopy August 28, 2020 which was essentially normal.  Previous gastric emptying scan was mildly abnormal.  She does report some regurgitation type symptoms.  No pyrosis.  Her PPI was increased to 40 mg of pantoprazole twice daily in addition to Pepcid at night.  She has been on this for a few months.  Has not noticed much change in her chronic cough.  She does have some hoarseness.  She uses cough drops chronically.  Per her medication list, she is on enalapril.  She is not sure how long she has been on the medication.  I am not certain that this has been considered as a possible cause for chronic cough.  We discussed this today.  Past Medical History:  Diagnosis Date   ALLERGIC RHINITIS     Allergy    Anxiety state, unspecified    Arthritis    Asthma    BRONCHITIS, CHRONIC    Cataract    b/l   Chronic low back pain 10/09/2016   COPD (chronic obstructive pulmonary disease) (HCC)    DEPRESSION    DIABETES MELLITUS, TYPE II    GERD    HYPERLIPIDEMIA    HYPERTENSION    Pneumonia    Psoriasis     Past Surgical History:  Procedure Laterality Date   ABDOMINAL HYSTERECTOMY  1980   CARPAL TUNNEL RELEASE Right    COLONOSCOPY     TRIGGER FINGER RELEASE Left    Index finger   UPPER GASTROINTESTINAL ENDOSCOPY      Family History  Problem Relation Age of Onset   Stroke Mother    Angina Mother    Hyperlipidemia Mother    Hypertension Mother    Heart disease Mother    Depression Mother    Diabetes Father    Hyperlipidemia Other        Parent   Hypertension Other  Parent   Diabetes Sister        x1   CAD Sister    COPD Sister        smoker   Hyperlipidemia Sister    Hypertension Sister    Diabetes Brother        x 2   Hyperlipidemia Brother    Hypertension Brother    Diabetes Brother    Hyperlipidemia Brother    Hypertension Brother    CAD Sister    Arthritis Sister    Hyperlipidemia Sister    Hypertension Sister    Colon cancer Neg Hx    Esophageal cancer Neg Hx    Rectal cancer Neg Hx    Stomach cancer Neg Hx    Pancreatic cancer Neg Hx     Social History:  reports that she has never smoked. She has never been exposed to tobacco smoke. She has never used smokeless tobacco. She reports that she does not drink alcohol and does not use drugs.  Allergies:  Allergies  Allergen Reactions   Metformin And Related Diarrhea   Penicillins Rash    Medications: I have reviewed the patient's current medications.  The PMH, PSH, Medications, Allergies, and SH were reviewed and updated.  ROS: Constitutional: Negative for fever, weight loss and weight gain. Cardiovascular: Negative for chest pain and dyspnea on exertion. Respiratory: Is not  experiencing shortness of breath at rest. Gastrointestinal: Negative for nausea and vomiting. Neurological: Negative for headaches. Psychiatric: The patient is not nervous/anxious  Blood pressure 124/67, pulse 85, height 5' (1.524 m), weight 149 lb (67.6 kg), SpO2 100%. Body mass index is 29.1 kg/m.  PHYSICAL EXAM:  Exam: General: Well-developed, well-nourished Communication and Voice: raspy Respiratory Respiratory effort: Equal inspiration and expiration without stridor Cardiovascular Peripheral Vascular: Warm extremities with equal color/perfusion Eyes: No nystagmus with equal extraocular motion bilaterally Neuro/Psych/Balance: Patient oriented to person, place, and time; Appropriate mood and affect; Gait is intact with no imbalance; Cranial nerves I-XII are intact Head and Face Inspection: Normocephalic and atraumatic without mass or lesion Palpation: Facial skeleton intact without bony stepoffs Salivary Glands: No mass or tenderness Facial Strength: Facial motility symmetric and full bilaterally ENT Pinna: External ear intact and fully developed External canal: Canal is patent with intact skin Tympanic Membrane: Clear and mobile External Nose: No scar or anatomic deformity Internal Nose: Septum is deviated to the right. No polyp, or purulence. Mucosal edema and erythema present.  Bilateral inferior turbinate hypertrophy.  Lips, Teeth, and gums: Mucosa and teeth intact and viable TMJ: No pain to palpation with full mobility Oral cavity/oropharynx: No erythema or exudate, no lesions present Nasopharynx: No mass or lesion with intact mucosa Hypopharynx: Intact mucosa without pooling of secretions Larynx Glottic: Full true vocal cord mobility without lesion or mass Supraglottic: Normal appearing epiglottis and AE folds Interarytenoid Space: Moderate pachydermia&edema Subglottic Space: Patent without lesion or edema Neck Neck and Trachea: Midline trachea without mass or  lesion Thyroid: No mass or nodularity Lymphatics: No lymphadenopathy  Procedure: Preoperative diagnosis: dysphonia and chronic cough   Postoperative diagnosis:   Same + GERD LPR  Procedure: Flexible fiberoptic laryngoscopy  Surgeon: Ashok Croon, MD  Anesthesia: Topical lidocaine and Afrin Complications: None Condition is stable throughout exam  Indications and consent:  The patient presents to the clinic with above symptoms. Indirect laryngoscopy view was incomplete. Thus it was recommended that they undergo a flexible fiberoptic laryngoscopy. All of the risks, benefits, and potential complications were reviewed with the patient preoperatively and  verbal informed consent was obtained.  Procedure: The patient was seated upright in the clinic. Topical lidocaine and Afrin were applied to the nasal cavity. After adequate anesthesia had occurred, I then proceeded to pass the flexible telescope into the nasal cavity. The nasal cavity was patent without rhinorrhea or polyp. The nasopharynx was also patent without mass or lesion. The base of tongue was visualized and was normal. There were no signs of pooling of secretions in the piriform sinuses. The true vocal folds were mobile bilaterally. There were no signs of glottic or supraglottic mucosal lesion or mass. There was moderate interarytenoid pachydermia and post cricoid edema. The telescope was then slowly withdrawn and the patient tolerated the procedure throughout.     PROCEDURE NOTE: nasal endoscopy  Preoperative diagnosis: chronic nasal congestion and nasal drainage symptoms  Postoperative diagnosis: same  Procedure: Diagnostic nasal endoscopy (82956)  Surgeon: Ashok Croon, M.D.  Anesthesia: Topical lidocaine and Afrin  H&P REVIEW: The patient's history and physical were reviewed today prior to procedure. All medications were reviewed and updated as well. Complications: None Condition is stable throughout  exam Indications and consent: The patient presents with symptoms of chronic sinusitis not responding to previous therapies. All the risks, benefits, and potential complications were reviewed with the patient preoperatively and informed consent was obtained. The time out was completed with confirmation of the correct procedure.   Procedure: The patient was seated upright in the clinic. Topical lidocaine and Afrin were applied to the nasal cavity. After adequate anesthesia had occurred, the rigid nasal endoscope was passed into the nasal cavity. The nasal mucosa, turbinates, septum, and sinus drainage pathways were visualized bilaterally. This revealed no purulence or significant secretions that might be cultured. There were no polyps or sites of significant inflammation. The mucosa was intact and there was no crusting present. The scope was then slowly withdrawn and the patient tolerated the procedure well. There were no complications or blood loss.    Studies Reviewed: CXR 02/04/2023 done for chronic cough FINDINGS: Cardiomediastinal silhouette unchanged in size and contour. No evidence of central vascular congestion. No interlobular septal thickening.   No pneumothorax or pleural effusion. Coarsened interstitial markings, with no confluent airspace disease.   No acute displaced fracture. Degenerative changes of the spine.   IMPRESSION: No active cardiopulmonary disease.  Assessment/Plan: Encounter Diagnoses  Name Primary?   Dysphonia Yes   Hoarseness    Environmental and seasonal allergies    Post-nasal drip    Chronic nasal congestion    Chronic cough    Chronic GERD    Age-related vocal fold atrophy    Glottic insufficiency     Assessment and Plan Assessment & Plan Hoarseness/Dysphonia x 5 mo Hoarseness likely due to age-related vocal fold muscle atrophy, reflux irritation, and postnasal drainage from allergies. No mass or tumor present and VF were mobile on exam. -  Recommended speech therapy to optimize breath support and strengthen the voice. - Continue reflux management with Pepcid and Protonix  - Advised dietary changes to manage reflux. - Consider using a seaweed paste supplement available on [employer] Reflux Gourmet to manage reflux. - Continue using Flonase and Zyrtec for allergy management. Refill sent. - Suggested nasal saline rinses to reduce allergens and postnasal drainage.  Chronic cough Persistent dry cough potentially caused by reflux and postnasal drainage vs cough variant asthma vs neurogenic. Hyper irritable larynx or neurogenic cough considered after ruling out asthma and significant lung disease. CXR negative 02/04/23. - Continue current allergy and reflux  management as discussed. - Consider revisiting the pulmonologist for further evaluation/cough variant asthma. - Discussed potential treatment options for neurogenic cough if other treatments are ineffective, including medications like gabapentin, amitriptyline, or nortriptyline, or superior laryngeal nerve block - she would like to hold off in SLN block today.  Chronic Gastroesophageal reflux disease (GERD) Reflux potentially causing hoarseness and chronic cough. Difficult to control; dietary changes and additional supplements recommended. - Continue reflux management with Pepcid and Protonix  - Advised dietary changes to manage reflux. - Consider using a seaweed paste supplement available on [employer] Reflux Gourmet to manage reflux..  Environmental Allergies and Chronic nasal congestion post-nasal drip/Allergic rhinitis Nasal endoscopy w/o pus or polyps, R NSD and ITH and PND. Allergies managed with Zyrtec and Flonase, though not consistently. Postnasal drainage may contribute to hoarseness and cough. She was previously receiving allergy shots, unclear if completed the series  - Continue Zyrtec and Flonase for allergy management. Rx sent - Recommend consistent use of nasal saline rinses to  clear allergens. - Consider revisiting an allergist for further evaluation and management if sx persist   RTC PRN if she decides to pursue SLN Block  Thank you for allowing me to participate in the care of this patient. Please do not hesitate to contact me with any questions or concerns.   Ashok Croon, MD Otolaryngology [facility] Phone: [phone] Fax: [phone]    02/02/2024, 10:18 AM

## 2024-02-12 ENCOUNTER — Other Ambulatory Visit: Payer: Self-pay | Admitting: Family

## 2024-02-15 ENCOUNTER — Other Ambulatory Visit: Payer: Self-pay | Admitting: Family Medicine

## 2024-02-16 ENCOUNTER — Other Ambulatory Visit: Payer: Self-pay | Admitting: Family Medicine

## 2024-02-16 MED ORDER — POTASSIUM CHLORIDE ER 20 MEQ PO TBCR: 1.0000 | EXTENDED_RELEASE_TABLET | Freq: Every day | ORAL | 0 refills | Status: DC

## 2024-02-16 NOTE — Telephone Encounter (Signed)
 Copied from CRM [id number]. Topic: Clinical - Medication Refill >> Feb 16, 2024  8:39 AM Heidi Franklin wrote: Most Recent Primary Care Visit:  Provider: Trenton Frock  Department: [facility]  Visit Type: ACUTE  Date: 12/23/2023  Medication: Potassium Chloride  ER 20 MEQ TBCR  Has the patient contacted their pharmacy? Yes (Agent: If no, request that the patient contact the pharmacy for the refill. If patient does not wish to contact the pharmacy document the reason why and proceed with request.) (Agent: If yes, when and what did the pharmacy advise?)  Is this the correct pharmacy for this prescription? Yes If no, delete pharmacy and type the correct one.  This is the patient's preferred pharmacy:  [facility] [phone] - [place], [place] - [name] Phone: [phone] Fax: [phone]   Has the prescription been filled recently? No  Is the patient out of the medication? Yes  Has the patient been seen for an appointment in the last year OR does the patient have an upcoming appointment? Yes  Can we respond through MyChart? Yes  Agent: Please be advised that Rx refills may take up to 3 business days. We ask that you follow-up with your pharmacy.

## 2024-03-04 ENCOUNTER — Ambulatory Visit: Admitting: Neurosurgery

## 2024-03-04 VITALS — BP 126/68 | Ht 60.0 in | Wt 149.0 lb

## 2024-03-04 DIAGNOSIS — G8929 Other chronic pain: Secondary | ICD-10-CM

## 2024-03-04 DIAGNOSIS — M545 Low back pain, unspecified: Secondary | ICD-10-CM | POA: Diagnosis not present
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
## 2024-03-09 NOTE — Progress Notes (Unsigned)
 Ben Jackson D.[facility] [address] [place] [address] Phone: [phone]   Assessment and Plan:     There are no diagnoses linked to this encounter.  ***   Pertinent previous records reviewed include ***    Follow Up: ***     Subjective:   I, Finis Hendricksen, am serving as a Neurosurgeon for Doctor Ulysees Gander   Chief Complaint: right knee pain    HPI:    09/17/23 Patient is a 78 year old female with concerns of right knee pain. Patient states she fell going up her stairs a week ago Sunday. Anterior knee pain. No radiating pain states the pain is deep. Tylenol  for the pain and that does not help. Has been using cream as well     Relevant Historical Information: Hypertension, DM type II, CKD 3  01/16/2024 Heidi Franklin is a 78 y.o. female who presents to [facility] at [facility] today for cont'd LBP. Pt was last seen by Dr. Cleora Daft on 09/17/23 and has been seen by Dr. Mont Antis at neurosurgery. Last lumbar ESI, 07/10/23, at L4-5.   Today, pt reports lower back pain. Pt locates pain to midline and left side lower back. Pain radiating into the gluteal region. Denies n/t/w. Taking Tylenol  and alternating heat/ice. Also using BioFreeze and Voltaren  Gel.    Dx imaging: 06/25/23 L-spine CT myelogram             05/15/23 L-spine XR   Pertinent review of systems: No fevers or chills   Relevant historical information: Hypertension.  Bronchiectasis.  Diabetes.  Hyperlipidemia.  03/10/2024 Patient states   Relevant Historical Information: ***  Additional pertinent review of systems negative.   Current Outpatient Medications:    acetaminophen  (TYLENOL ) 325 MG tablet, Take 650 mg by mouth every 6 (six) hours as needed for mild pain or fever., Disp: , Rfl:    albuterol  (VENTOLIN  HFA) 108 (90 Base) MCG/ACT inhaler, INHALE 1-2 PUFFS INTO THE LUNGS EVERY 6 (SIX) HOURS AS NEEDED FOR WHEEZING OR SHORTNESS OF BREATH., Disp: 8.5  each, Rfl: 3   ALPRAZolam  (XANAX ) 0.5 MG tablet, TAKE 1 TABLET BY MOUTH TWICE A DAY AS NEEDED FOR ANXIETY (Patient taking differently: Take 0.5 mg by mouth at bedtime as needed for anxiety or sleep.), Disp: 60 tablet, Rfl: 1   amLODipine  (NORVASC ) 10 MG tablet, TAKE 1 TABLET BY MOUTH EVERY DAY, Disp: 90 tablet, Rfl: 1   aspirin  81 MG chewable tablet, Chew 81 mg by mouth daily., Disp: , Rfl:    betamethasone  dipropionate (DIPROLENE ) 0.05 % ointment, Apply 1 application  topically 2 (two) times daily., Disp: , Rfl:    budesonide -formoterol  (SYMBICORT ) 160-4.5 MCG/ACT inhaler, Inhale 2 puffs into the lungs 2 (two) times daily., Disp: 1 each, Rfl: 1   cetirizine  (ZYRTEC ) 10 MG tablet, Take 1 tablet (10 mg total) by mouth daily., Disp: 30 tablet, Rfl: 11   Cholecalciferol (VITAMIN D3) 50 MCG (2000 UT) capsule, Take 2,000 Units by mouth daily., Disp: , Rfl:    erythromycin  ophthalmic ointment, Place 1 Application into the left eye 2 (two) times daily., Disp: , Rfl:    famotidine  (PEPCID ) 40 MG tablet, TAKE 1 TABLET BY MOUTH EVERY DAY, Disp: 90 tablet, Rfl: 0   fluticasone  (CUTIVATE ) 0.05 % cream, Apply 1 Application topically 2 (two) times daily., Disp: , Rfl:    fluticasone  (FLONASE ) 50 MCG/ACT nasal spray, Place 2 sprays into both nostrils 2 (two) times daily., Disp:  16 g, Rfl: 6   glimepiride  (AMARYL ) 1 MG tablet, TAKE 1 TABLET BY MOUTH EVERY DAY WITH BREAKFAST (Patient taking differently: Take 1 mg by mouth daily with breakfast.), Disp: 90 tablet, Rfl: 1   glucose blood (ONE TOUCH ULTRA TEST) test strip, 1 each by Other route daily. Use 1 strips to check blood sugar twice a day Ex E11.9, Disp: 100 each, Rfl: 3   HUMIRA  PEN-PS/UV/ADOL HS START 40 MG/0.8ML PNKT, Inject 1 Dose into the skin every 14 (fourteen) days. Every 2 weeks, Disp: , Rfl:    isosorbide  mononitrate (IMDUR ) 30 MG 24 hr tablet, Take 1 tablet (30 mg total) by mouth daily., Disp: 90 tablet, Rfl: 3   ketoconazole  (NIZORAL ) 2 % cream, Apply  1 Application topically daily as needed for irritation., Disp: , Rfl:    Lancets (ONETOUCH ULTRASOFT) lancets, PRN (Patient taking differently: 1 each by Other route as needed for other (glucose check). PRN), Disp: 100 each, Rfl: 3   meloxicam  (MOBIC ) 15 MG tablet, Take 1 tablet (15 mg total) by mouth daily., Disp: 60 tablet, Rfl: 0   methocarbamol  (ROBAXIN ) 500 MG tablet, Take 1 tablet (500 mg total) by mouth every 6 (six) hours as needed for muscle spasms., Disp: 40 tablet, Rfl: 0   metoprolol  succinate (TOPROL -XL) 50 MG 24 hr tablet, Take 1 tablet (50 mg total) by mouth daily., Disp: 90 tablet, Rfl: 2   mometasone (ELOCON) 0.1 % ointment, Apply 1 application  topically daily., Disp: , Rfl:    montelukast  (SINGULAIR ) 10 MG tablet, TAKE 1 TABLET BY MOUTH EVERYDAY AT BEDTIME, Disp: 90 tablet, Rfl: 1   Multiple Vitamins-Minerals (CENTRUM SILVER PO), Take 1 tablet by mouth every morning., Disp: , Rfl:    nitroGLYCERIN  (NITROSTAT ) 0.4 MG SL tablet, Place 1 tablet (0.4 mg total) under the tongue every 5 (five) minutes as needed. (Patient taking differently: Place 0.4 mg under the tongue every 5 (five) minutes as needed for chest pain.), Disp: 30 tablet, Rfl: 3   pantoprazole  (PROTONIX ) 40 MG tablet, Take 1 tablet (40 mg total) by mouth 2 (two) times daily., Disp: 60 tablet, Rfl: 6   Potassium Chloride  ER 20 MEQ TBCR, Take 1 tablet (20 mEq total) by mouth daily. TAKE 1 TABLET BY MOUTH EVERY DAY, Disp: 90 tablet, Rfl: 0   rosuvastatin  (CRESTOR ) 40 MG tablet, Take 1 tablet (40 mg total) by mouth daily., Disp: 90 tablet, Rfl: 3   triamcinolone  cream (KENALOG ) 0.1 %, Apply 1 Application topically 2 (two) times daily as needed (rash)., Disp: , Rfl:    valACYclovir  (VALTREX ) 500 MG tablet, Take 500 mg by mouth 2 (two) times daily., Disp: , Rfl: 0   Objective:     There were no vitals filed for this visit.    There is no height or weight on file to calculate BMI.    Physical Exam:     ***   Electronically signed by:  Marshall Skeeter D[facility] 7:38 AM 03/09/24

## 2024-03-10 ENCOUNTER — Other Ambulatory Visit: Payer: Self-pay | Admitting: Sports Medicine

## 2024-03-10 ENCOUNTER — Telehealth: Payer: Self-pay | Admitting: Sports Medicine

## 2024-03-10 ENCOUNTER — Ambulatory Visit: Admitting: Sports Medicine

## 2024-03-10 VITALS — HR 89 | Ht 60.0 in | Wt 140.0 lb

## 2024-03-10 DIAGNOSIS — M1711 Unilateral primary osteoarthritis, right knee: Secondary | ICD-10-CM

## 2024-03-10 DIAGNOSIS — M25561 Pain in right knee: Secondary | ICD-10-CM

## 2024-03-10 DIAGNOSIS — M5126 Other intervertebral disc displacement, lumbar region: Secondary | ICD-10-CM

## 2024-03-10 DIAGNOSIS — G8929 Other chronic pain: Secondary | ICD-10-CM | POA: Diagnosis not present

## 2024-03-10 DIAGNOSIS — M5136 Other intervertebral disc degeneration, lumbar region with discogenic back pain only: Secondary | ICD-10-CM | POA: Diagnosis not present

## 2024-03-10 DIAGNOSIS — M1712 Unilateral primary osteoarthritis, left knee: Secondary | ICD-10-CM

## 2024-03-10 NOTE — Progress Notes (Signed)
 Referral placed.

## 2024-03-10 NOTE — Patient Instructions (Addendum)
 We would like to order a radio frequency ablation or RFA  Call and let us  know if you would like to proceed We will see you 2 weeks after your procedure

## 2024-03-10 NOTE — Telephone Encounter (Signed)
 Patient called to let Dr Cleora Daft know that she would like to proceed with the procedure that was discussed today at her visit.

## 2024-03-10 NOTE — Telephone Encounter (Signed)
 Referral placed.

## 2024-03-15 ENCOUNTER — Telehealth: Payer: Self-pay | Admitting: Sports Medicine

## 2024-03-15 NOTE — Telephone Encounter (Signed)
 Gave information to patient. She is scheduled to see someone on 5/28 to discuss the RFA procedure. Explained recommendations and she states that she will call us  back if pain continues or worsens.

## 2024-03-15 NOTE — Telephone Encounter (Signed)
 Patient called and asked if she had been treated for pain in the back of her heel on her right foot. Is there anything he can recommend that she does. She is having trouble walking and says she just had injection in that knee last week.

## 2024-03-17 ENCOUNTER — Ambulatory Visit (INDEPENDENT_AMBULATORY_CARE_PROVIDER_SITE_OTHER): Admitting: Family Medicine

## 2024-03-17 ENCOUNTER — Other Ambulatory Visit: Payer: Self-pay

## 2024-03-17 VITALS — BP 122/68 | HR 84 | Ht 60.0 in | Wt 147.0 lb

## 2024-03-17 DIAGNOSIS — M79671 Pain in right foot: Secondary | ICD-10-CM

## 2024-03-17 DIAGNOSIS — G8929 Other chronic pain: Secondary | ICD-10-CM | POA: Diagnosis not present

## 2024-03-17 NOTE — Patient Instructions (Addendum)
 Thank you for coming in today.   Please go to [facility] supply to get the CAM [employer] we talked about today. You may also be able to get it from [employer] [facility].    Wear the boot for 2 weeks.  Please work on the home exercises the athletic trainer went over with you:  View at [email or web address] code [email or web address]  Follow up with me or Dr. Cleora Daft in 1 month

## 2024-03-17 NOTE — Progress Notes (Signed)
   Joanna Muck, PhD, LAT, ATC acting as a scribe for Garlan Juniper, MD.   Heidi Franklin is a 78 y.o. female who presents to [facility] at [facility] today for R heel pain. Pt was last seen by Dr. Cleora Daft on 03/10/24 for LBP.  Today, pt c/o R heel pain ongoing since last week, worsening. Pt locates pain to the posterior aspect of her R calcaneous, slightly into distal Achilles  Aggravates: PF/DF, walking Treatments tried: ice, Tylenol , heel pads in her shoes  Pertinent review of systems: No fevers or chills  Relevant historical information: Hypertension diabetes.   Exam:  BP 122/68   Pulse 84   Ht 5' (1.524 m)   Wt 147 lb (66.7 kg)   SpO2 99%   BMI 28.71 kg/m  General: Well Developed, well nourished, and in no acute distress.   MSK: Right heel slight swelling posterior calcaneus.  Tender palpation in this region.  Decreased motion pain with dorsiflexion.  Pain with resisted plantarflexion.    Lab and Radiology Results  Diagnostic Limited MSK Ultrasound of: Right posterior calcaneus Increased diameter Achilles tendon at insertion with increased calcifications.  Consistent with significant chronic calcific Achilles tendinitis. Mild retrocalcaneal bursitis is present. Impression: Significant chronic calcific Achilles tendinitis.  No visible full-thickness tear.      Assessment and Plan: 78 y.o. female with right posterior calcaneal pain due to significant Achilles tendinitis with calcifications.  She is having too much pain now for home exercises to be effective.  Plan for CAM Walker boot for about 2 weeks and Voltaren  gel.  After 2 weeks start home exercise program taught in clinic today.  Recheck with either me or Dr. Cleora Daft in 4 weeks.   PDMP not reviewed this encounter. Orders Placed This Encounter  Procedures   US  LIMITED JOINT SPACE STRUCTURES LOW RIGHT(NO LINKED CHARGES)    Reason for Exam (SYMPTOM  OR DIAGNOSIS REQUIRED):   right heel pain     Preferred imaging location?:   [facility]   No orders of the defined types were placed in this encounter.    Discussed warning signs or symptoms. Please see discharge instructions. Patient expresses understanding.   The above documentation has been reviewed and is accurate and complete Garlan Juniper, M.D.

## 2024-03-24 ENCOUNTER — Encounter: Payer: Self-pay | Admitting: Physical Medicine and Rehabilitation

## 2024-03-24 ENCOUNTER — Ambulatory Visit: Admitting: Physical Medicine and Rehabilitation

## 2024-03-24 ENCOUNTER — Other Ambulatory Visit: Payer: Self-pay | Admitting: Physical Medicine and Rehabilitation

## 2024-03-24 DIAGNOSIS — G8929 Other chronic pain: Secondary | ICD-10-CM | POA: Diagnosis not present

## 2024-03-24 DIAGNOSIS — M47819 Spondylosis without myelopathy or radiculopathy, site unspecified: Secondary | ICD-10-CM

## 2024-03-24 DIAGNOSIS — M545 Low back pain, unspecified: Secondary | ICD-10-CM

## 2024-03-24 DIAGNOSIS — M47816 Spondylosis without myelopathy or radiculopathy, lumbar region: Secondary | ICD-10-CM | POA: Diagnosis not present

## 2024-03-24 NOTE — Progress Notes (Unsigned)
 Core Outcome Measures Index (COMI) Back Score  Average Pain 5  COMI Score 50 %

## 2024-03-24 NOTE — Progress Notes (Unsigned)
 Heidi Franklin - 78 y.o. female MRN [id number]  Date of birth: 13-Sep-1946  Office Visit Note: Visit Date: 03/24/2024 PCP: Neda Balk, MD Referred by: Ulysees Gander, DO  Subjective: Chief Complaint  Patient presents with   Lower Back - Pain   HPI: Heidi Franklin is a 78 y.o. female who comes in today per the request of Dr. Ulysees Gander for evaluation of chronic, worsening and severe bilateral lower back pain. Pain ongoing for several years. She is here today to discuss lumbar medial branch blocks and possible radiofrequency ablation. Her pain is constant, worsens with household activities such as washing dishes and vacuuming. She describes her pain as sore and sharp sensation, currently rates as 8 out of 10. History of formal physical in [place] with minimal relief of pain. CT lumbar myelogram from 2024 shows multi level facet hypertrophy, narrowing of both lateral recesses and foramina at L4-L5, left more than right. No high grade spinal canal stenosis noted. She is unable to undergo MRI imaging due to claustrophobia. She is currently managed by Dr. Sharleen Dawley, history of SI joint injection in his office with minimal relief of pain. She also underwent left L4-L5 interlaminar epidural steroid injection in 2024 at [facility] with no relief of pain. Patient denies focal weakness, numbness and tingling. No recent trauma or falls.       Review of Systems  Musculoskeletal:  Positive for back pain.  Neurological:  Negative for tingling, sensory change, focal weakness and weakness.  All other systems reviewed and are negative.  Otherwise per HPI.  Assessment & Plan: Visit Diagnoses:    ICD-10-CM   1. Chronic bilateral low back pain without sciatica  M54.50    G89.29     2. Spondylosis without myelopathy or radiculopathy  M47.819     3. Facet arthropathy, lumbar  M47.816        Plan: Findings:  Chronic, worsening and severe bilateral axial back pain. No radicular  symptoms down the legs.  Patient continues to have severe pain despite good conservative therapy such as formal physical therapy, home exercise regimen, rest and use of medications.  She has underwent both left SI joint injection and lumbar interlaminar epidural steroid injection with minimal relief of pain.  CT lumbar myelogram from 2024 does show multilevel facet hypertrophy.  We discussed treatment plan in detail today. Next step is to perform diagnostic bilateral L3-L4, L4-L5 and L5-S1 facet joint/medial branch blocks under fluoroscopic guidance.  If good relief of pain with medial branch blocks would consider longer sustained pain relief with radiofrequency ablation.  I discussed facet joint injection procedure and radiofrequency ablation in detail today, patient verbalized understanding.  I did provide her with educational material regarding ablation procedure to take home and review.  She would like to talk with her husband before moving forward with any type of interventional procedure.  I instructed her to let us  know so that we can place orders and obtain approval from insurance company.  She has no questions at this time.  No red flag symptoms noted upon exam today.    Meds & Orders: No orders of the defined types were placed in this encounter.  No orders of the defined types were placed in this encounter.   Follow-up: Return if symptoms worsen or fail to improve.   Procedures: No procedures performed      Clinical History: Narrative & Impression CLINICAL DATA:  Left low back and hip region pain.   EXAM:  LUMBAR MYELOGRAM   FLUOROSCOPY: 0 minutes 48 seconds.  210.60 micro gray meter squared.   PROCEDURE: After thorough discussion of risks and benefits of the procedure including bleeding, infection, injury to nerves, blood vessels, adjacent structures as well as headache and CSF leak, written and oral informed consent was obtained. Consent was obtained by Dr. Bettylou Brunner. Time out  form was completed.   Patient was positioned prone on the fluoroscopy table. Local anesthesia was provided with 1% lidocaine without epinephrine after prepped and draped in the usual sterile fashion. Puncture was performed at L3-4 using a 3 1/2 inch 22-gauge spinal needle via right paramedian approach. Using a single pass through the dura, the needle was placed within the thecal sac, with return of clear CSF. 15 mL of Isovue  M-200 was injected into the thecal sac, with normal opacification of the nerve roots and cauda equina consistent with free flow within the subarachnoid space.   I personally performed the lumbar puncture and administered the intrathecal contrast. I also personally performed acquisition of the myelogram images.   TECHNIQUE: Contiguous axial images were obtained through the Lumbar spine after the intrathecal infusion of infusion. Coronal and sagittal reconstructions were obtained of the axial image sets.   COMPARISON:  Prior myelogram study 05/13/2019   FINDINGS: LUMBAR MYELOGRAM FINDINGS:   Mild scoliotic curvature convex to the right. Minimal anterior extradural defects at L2-3, L3-4, L4-5 and L5-S1 consistent with disc bulges. Mild stenosis of the left lateral recess at L2-3 and L3-4 and of both lateral recesses at L4-5 left more right standing flexion extension views do not show any abnormal motion or gross change.   CT LUMBAR MYELOGRAM FINDINGS:   T12-L1: Normal   L1-2: Normal   L2-3: Mild bulging of the disc more towards the left. Mild narrowing the left lateral recess and intervertebral foramen on the left but no likely neural compression.   L3-4: Bulging of the disc slightly more prominent towards the left. Mild narrowing of the left lateral recess and of the intervertebral foramen on the left that could possibly cause neural compression or irritation.   L4-5: Bulging of the disc more prominent towards the left. Facet and ligamentous  hypertrophy more prominent on the left. Narrowing of both lateral recesses and foramina, left more than right. Neural compression could occur at this level, more likely on left.   L5-S1: Mild bulging of the disc. Mild bilateral facet osteoarthritis. No apparent compressive narrowing of the canal or foramina.   Mild bilateral sacroiliac osteoarthritis is noted.   Findings in general appears similar to the examination of 2020.   IMPRESSION: 1. L4-5: Bulging of the disc more prominent towards the left. Facet and ligamentous hypertrophy more prominent on the left. Narrowing of both lateral recesses and foramina, left more than right. Neural compression could occur at this level, more likely on the left. 2. L3-4: Bulging of the disc more prominent towards the left. Mild narrowing of the left lateral recess and intervertebral foramen on the left that could possibly cause neural compression or irritation. 3. L2-3: Mild bulging of the disc more prominent towards the left. Mild narrowing of the left lateral recess and intervertebral foramen on the left but no likely neural compression. 4. L5-S1: Mild bulging of the disc. Mild bilateral facet osteoarthritis. No apparent compressive narrowing of the canal or foramina. 5. Mild bilateral sacroiliac osteoarthritis.     Electronically Signed   By: Bettylou Brunner M.D.   On: 06/25/2023 10:01   She reports  that she has never smoked. She has never been exposed to tobacco smoke. She has never used smokeless tobacco.  Recent Labs    06/27/23 1111 08/11/23 1530 12/15/23 0903  HGBA1C 6.9* 6.6* 6.5  LABURIC  --   --  4.4    Objective:  VS:  HT:    WT:   BMI:     BP:   HR: bpm  TEMP: ( )  RESP:  Physical Exam Vitals and nursing note reviewed.  HENT:     Head: Normocephalic and atraumatic.     Right Ear: External ear normal.     Left Ear: External ear normal.     Nose: Nose normal.     Mouth/Throat:     Mouth: Mucous membranes are  moist.  Eyes:     Extraocular Movements: Extraocular movements intact.  Cardiovascular:     Rate and Rhythm: Normal rate.     Pulses: Normal pulses.  Pulmonary:     Effort: Pulmonary effort is normal.  Abdominal:     General: Abdomen is flat. There is no distension.  Musculoskeletal:        General: Tenderness present.     Cervical back: Normal range of motion.     Comments: Patient is slow to rise from seated position to standing.  Pain noted with facet loading and lumbar extension. 5/5 strength noted with bilateral hip flexion, knee flexion/extension, ankle dorsiflexion/plantarflexion and EHL. No clonus noted bilaterally. No pain upon palpation of greater trochanters. No pain with internal/external rotation of bilateral hips. Sensation intact bilaterally. Negative slump test bilaterally. Ambulates without aid, gait slow.    Skin:    General: Skin is warm and dry.     Capillary Refill: Capillary refill takes less than 2 seconds.  Neurological:     General: No focal deficit present.     Mental Status: She is alert and oriented to person, place, and time.  Psychiatric:        Mood and Affect: Mood normal.        Behavior: Behavior normal.     Ortho Exam  Imaging: No results found.  Past Medical/Family/Surgical/Social History: Medications & Allergies reviewed per EMR, new medications updated. Patient Active Problem List   Diagnosis Date Noted   Coronary artery disease no hemodynamically significant stenosis based on coronary CT angio from March 2023 10/01/2022   Positive FIT (fecal immunochemical test) 09/25/2022   ALLERGIC RHINITIS 11/15/2021   Arthritis 11/15/2021   Asthma 11/15/2021   BRONCHITIS, CHRONIC 11/15/2021   Change in bowel habits 10/18/2021   Type 2 diabetes mellitus with hyperglycemia (HCC) 09/24/2021   Acute bilateral knee pain 09/24/2021   Vitiligo 09/07/2021   Pedal edema 03/27/2020   Body mass index (BMI) 31.0-31.9, adult 11/08/2019   Disc displacement,  lumbar 11/08/2019   Strain of lumbar paraspinal muscle 11/08/2019   History of chicken pox 04/11/2019   Vitamin D  deficiency 04/05/2019   Lumbar radiculopathy 03/31/2019   Neck pain 07/06/2018   Trapezius muscle strain, right, initial encounter 06/23/2018   Edema 06/23/2018   Gout 04/08/2018   Herpes simplex 02/10/2018   ETD (eustachian tube dysfunction) 02/10/2018   Peroneal tendinitis, right 01/06/2018   Psoriasis 11/14/2017   Acute bursitis of right shoulder 09/29/2017   Bilateral hip pain 07/10/2017   Bronchiectasis without complication (HCC) 06/25/2017   Irritable larynx syndrome 04/25/2017   Reflux laryngitis 04/23/2017   Benign lipomatous neoplasm of skin and subcutaneous tissue of right leg 02/25/2017   Anemia 02/05/2017  Fatigue 01/23/2017   Cough 01/02/2017   Multiple pulmonary nodules 11/10/2016   Chronic low back pain 10/09/2016   Degenerative arthritis of right knee 06/20/2016   Degenerative arthritis of left knee 04/10/2016   Peroneal tendinitis of left lower extremity 09/05/2015   Recurrent falls 08/08/2015   Bursitis of left shoulder 07/13/2015   Morbid obesity due to excess calories (HCC) 04/16/2015   Atypical chest pain 05/11/2011   Nonspecific abnormal electrocardiogram (ECG) (EKG) 05/11/2011   Hyperlipidemia associated with type 2 diabetes mellitus (HCC) 03/02/2010   Depression with anxiety 03/02/2010   Essential hypertension 03/02/2010   Chronic rhinitis 03/02/2010   Upper airway cough syndrome 03/02/2010   GERD 03/02/2010   Past Medical History:  Diagnosis Date   ALLERGIC RHINITIS    Allergy     Anxiety state, unspecified    Arthritis    Asthma    BRONCHITIS, CHRONIC    Cataract    b/l   Chronic low back pain 10/09/2016   COPD (chronic obstructive pulmonary disease) (HCC)    DEPRESSION    DIABETES MELLITUS, TYPE II    GERD    HYPERLIPIDEMIA    HYPERTENSION    Pneumonia    Psoriasis    Family History  Problem Relation Age of Onset    Stroke Mother    Angina Mother    Hyperlipidemia Mother    Hypertension Mother    Heart disease Mother    Depression Mother    Diabetes Father    Hyperlipidemia Other        Parent   Hypertension Other        Parent   Diabetes Sister        x1   CAD Sister    COPD Sister        smoker   Hyperlipidemia Sister    Hypertension Sister    Diabetes Brother        x 2   Hyperlipidemia Brother    Hypertension Brother    Diabetes Brother    Hyperlipidemia Brother    Hypertension Brother    CAD Sister    Arthritis Sister    Hyperlipidemia Sister    Hypertension Sister    Colon cancer Neg Hx    Esophageal cancer Neg Hx    Rectal cancer Neg Hx    Stomach cancer Neg Hx    Pancreatic cancer Neg Hx    Past Surgical History:  Procedure Laterality Date   ABDOMINAL HYSTERECTOMY  1980   CARPAL TUNNEL RELEASE Right    COLONOSCOPY     TRIGGER FINGER RELEASE Left    Index finger   UPPER GASTROINTESTINAL ENDOSCOPY     Social History   Occupational History   Occupation: Retired   Tobacco Use   Smoking status: Never    Passive exposure: Never   Smokeless tobacco: Never  Vaping Use   Vaping status: Never Used  Substance and Sexual Activity   Alcohol  use: No    Alcohol /week: 0.0 standard drinks of alcohol    Drug use: No   Sexual activity: Not Currently

## 2024-03-24 NOTE — Progress Notes (Unsigned)
 Pain Scale   Average Pain 5 Patient advising she suffers from Chronic  lower back pain, without relief.        +Driver, -BT, -Dye Allergies.

## 2024-04-02 ENCOUNTER — Other Ambulatory Visit: Payer: Self-pay | Admitting: Family Medicine

## 2024-04-05 ENCOUNTER — Encounter: Payer: Self-pay | Admitting: Family Medicine

## 2024-04-14 ENCOUNTER — Encounter: Payer: Self-pay | Admitting: Family Medicine

## 2024-04-14 ENCOUNTER — Ambulatory Visit (INDEPENDENT_AMBULATORY_CARE_PROVIDER_SITE_OTHER): Admitting: Family Medicine

## 2024-04-14 VITALS — BP 124/72 | HR 97 | Temp 98.0°F | Resp 16 | Ht 61.0 in | Wt 145.8 lb

## 2024-04-14 DIAGNOSIS — J4521 Mild intermittent asthma with (acute) exacerbation: Secondary | ICD-10-CM

## 2024-04-14 MED ORDER — BUDESONIDE-FORMOTEROL FUMARATE 160-4.5 MCG/ACT IN AERO: INHALATION_SPRAY | RESPIRATORY_TRACT | 1 refills | Status: DC

## 2024-04-14 MED ORDER — PREDNISONE 20 MG PO TABS: 40.0000 mg | ORAL_TABLET | Freq: Every day | ORAL | 0 refills | Status: AC

## 2024-04-14 NOTE — Telephone Encounter (Signed)
 Called pt was advised she would need be see, appt schedule with Dr.Wendling 11:15 today.

## 2024-04-14 NOTE — Patient Instructions (Addendum)
 Continue to push fluids, practice good hand hygiene, and cover your mouth if you cough.  If you start having fevers, shaking or shortness of breath, seek immediate care.  Send me a message in 4-5 d if not significantly improved.   Asthma Action Plan There are 3 zones to consider: 1. Green Zone- This is the zone we hope to keep you in. Avoiding allergens and compliance with inhalers will keep you in this safe zone. No need to take anything extra while in this zone. 2. Yellow Zone- This zone is where we can save time, money and visits. You are not doing well enough to be considered in the green zone, but not bad enough to be in the red zone. This is where we need to remove any allergen or factor that is contributing to your breathing issues. You have a separate inhaler (inhaled steroid) to take twice daily in addition to your other inhalers. This should give you the push you need to get back to the green zone. Contact our office if you have any questions. 3. Red Zone- This is the zone where you should consider calling 911 vs going to the ER. Despite compliance with inhalers/meds (or maybe because we haven't been using them), our breathing isn't where we want it to be. Albuterol  isn't helping as much either and you need medical care. This is the zone we try to avoid as much as possible!  Let us  know if you need anything.

## 2024-04-14 NOTE — Progress Notes (Signed)
 Chief Complaint  Patient presents with   Cough    Heidi Franklin here for URI complaints.  Duration: 1 month  Associated symptoms: rhinorrhea, ear fullness, wheezing, chest tightness, and a productive cough (usually has a dry cough) Denies: sinus congestion, sinus pain, itchy watery eyes, ear pain, ear drainage, sore throat, shortness of breath, myalgia, and fevers Treatment to date: SABA, Claritin, Mucinex Sick contacts: No  Past Medical History:  Diagnosis Date   ALLERGIC RHINITIS    Allergy     Anxiety state, unspecified    Arthritis    Asthma    BRONCHITIS, CHRONIC    Cataract    b/l   Chronic low back pain 10/09/2016   COPD (chronic obstructive pulmonary disease) (HCC)    DEPRESSION    DIABETES MELLITUS, TYPE II    GERD    HYPERLIPIDEMIA    HYPERTENSION    Pneumonia    Psoriasis     Objective BP 124/72 (BP Location: Left Arm, Patient Position: Sitting)   Pulse 97   Temp 98 F (36.7 C) (Oral)   Resp 16   Ht 5' 1 (1.549 m)   Wt 145 lb 12.8 oz (66.1 kg)   SpO2 95%   BMI 27.55 kg/m  General: Awake, alert, appears stated age HEENT: AT, [place], ears patent b/l and TM's neg, nares patent w/o discharge, pharynx pink and without exudates, MMM, no sinus ttp Neck: No masses or asymmetry Heart: RRR Lungs: faint wheezes at bases, no accessory muscle use Psych: Age appropriate judgment and insight, normal mood and affect  Mild intermittent asthma with acute exacerbation - Plan: predniSONE  (DELTASONE ) 20 MG tablet  Exacerbation of chronic issue.  5-day prednisone  burst 40 mg daily.  Send message in 5 days if not significantly improved.  At we will also refill her Symbicort  for yellow zone usage.  Information regarding this provided in her AVS.  Continue to push fluids, practice good hand hygiene, cover mouth when coughing. F/u prn. If starting to experience fevers, shaking, or worsening shortness of breath, seek immediate care. Pt voiced understanding and agreement to the  plan.  Shellie Dials [place], DO 04/14/24 11:33 AM

## 2024-04-16 ENCOUNTER — Other Ambulatory Visit: Payer: Self-pay | Admitting: Physical Medicine and Rehabilitation

## 2024-04-16 ENCOUNTER — Telehealth: Payer: Self-pay | Admitting: Physical Medicine and Rehabilitation

## 2024-04-16 ENCOUNTER — Ambulatory Visit: Admitting: Sports Medicine

## 2024-04-16 DIAGNOSIS — G8929 Other chronic pain: Secondary | ICD-10-CM

## 2024-04-16 DIAGNOSIS — M47816 Spondylosis without myelopathy or radiculopathy, lumbar region: Secondary | ICD-10-CM

## 2024-04-16 DIAGNOSIS — M47817 Spondylosis without myelopathy or radiculopathy, lumbosacral region: Secondary | ICD-10-CM

## 2024-04-16 NOTE — Telephone Encounter (Signed)
 Pt called requesting a call about a procedure appt with [facility]. Please call pt at [phone].

## 2024-04-17 ENCOUNTER — Other Ambulatory Visit: Payer: Self-pay | Admitting: Family Medicine

## 2024-04-19 ENCOUNTER — Telehealth: Payer: Self-pay | Admitting: Internal Medicine

## 2024-04-19 NOTE — Telephone Encounter (Signed)
 Pt reports she is having issues with fecal incontinence. States she had a little happen a few months ago but now it is more frequent. Pt scheduled to see Dr Abran 04/23/24@11 :40am, pt aware of appt.

## 2024-04-19 NOTE — Telephone Encounter (Signed)
 Inbound call from patient states she is having issues controlling her bowls. Requesting to speak with a nurse.   Please advise. Thank you

## 2024-04-22 ENCOUNTER — Telehealth: Payer: Self-pay | Admitting: Physical Medicine and Rehabilitation

## 2024-04-22 DIAGNOSIS — Z1231 Encounter for screening mammogram for malignant neoplasm of breast: Secondary | ICD-10-CM | POA: Diagnosis not present

## 2024-04-22 LAB — HM MAMMOGRAPHY

## 2024-04-22 NOTE — Telephone Encounter (Signed)
 Patient called and said that she was waiting on a call to be approved for the RFA. CB#[id number]

## 2024-04-23 ENCOUNTER — Ambulatory Visit: Admitting: Internal Medicine

## 2024-04-23 ENCOUNTER — Encounter: Payer: Self-pay | Admitting: Internal Medicine

## 2024-04-23 ENCOUNTER — Ambulatory Visit: Payer: Self-pay

## 2024-04-23 VITALS — BP 100/50 | HR 84 | Ht 59.0 in | Wt 146.1 lb

## 2024-04-23 DIAGNOSIS — Z860101 Personal history of adenomatous and serrated colon polyps: Secondary | ICD-10-CM | POA: Diagnosis not present

## 2024-04-23 DIAGNOSIS — R151 Fecal smearing: Secondary | ICD-10-CM | POA: Diagnosis not present

## 2024-04-23 DIAGNOSIS — K219 Gastro-esophageal reflux disease without esophagitis: Secondary | ICD-10-CM

## 2024-04-23 NOTE — Telephone Encounter (Signed)
 FYI Only or Action Required?: FYI only for provider.  Patient was last seen in primary care on 04/14/2024 by Frann Mabel Mt, DO. Called Nurse Triage reporting low blood pressure, Fatigue, intermittent pressure in chest, and Dizziness. Symptoms began today. Interventions attempted: Nothing. Symptoms are: rapidly worsening.  Triage Disposition: Go to ED or PCP/Alternative with Approval  Patient/caregiver understands and will follow disposition?: No, refuses disposition - Heading to UC instead per pt preference      Copied from CRM [id number]. Topic: Clinical - Red Word Triage >> Apr 23, 2024  2:26 PM Franky GRADE wrote: Red Word that prompted transfer to Nurse Triage: Patient Is experiencing low blood pressure, 100/50. She is feeling fatigued. Reason for Disposition  [1] Fall in systolic BP > 20 mm Hg from normal AND [2] dizzy, lightheaded, or weak  Answer Assessment - Initial Assessment Questions 1. BLOOD PRESSURE: What is the blood pressure? Did you take at least two measurements 5 minutes apart?     100/50 3 hours ago at another appt, they said BP low but nothing more 106/49 at 2:40 pm 3. HOW: How did you obtain the blood pressure? (e.g., visiting nurse, automatic home BP monitor)     Another appt, going to take BP on at-home cuff per nurse request 4. HISTORY: Do you have a history of low blood pressure? What is your blood pressure normally?       No hx of low blood pressure 5. MEDICINES: Are you taking any medications for blood pressure? If Yes, ask: Have they been changed recently?    usually higher, take BP meds, amlodipine  6. PULSE RATE: Do you know what your pulse rate is?      88 bpm 7. OTHER SYMPTOMS: Have you been sick recently? Have you had a recent injury?     Fatigued, no chest pain or SOB, back pain from chronic pain, no dizziness, just felt unsteady for couple days Felt a quick pressure in the chest just a sec each time  Protocols used: Blood  Pressure - Low-A-AH

## 2024-04-23 NOTE — Patient Instructions (Signed)
 Take 2 tablespoons of Citrucel in water or juice daily.  Please follow up as needed.  _______________________________________________________  If your blood pressure at your visit was 140/90 or greater, please contact your primary care physician to follow up on this.  _______________________________________________________  If you are age 79 or older, your body mass index should be between 23-30. Your Body mass index is 29.51 kg/m. If this is out of the aforementioned range listed, please consider follow up with your Primary Care Provider.  If you are age 38 or younger, your body mass index should be between 19-25. Your Body mass index is 29.51 kg/m. If this is out of the aformentioned range listed, please consider follow up with your Primary Care Provider.   ________________________________________________________  The [facility] providers would like to encourage you to use MYCHART to communicate with providers for non-urgent requests or questions.  Due to long hold times on the telephone, sending your provider a message by [facility] may be a faster and more efficient way to get a response.  Please allow 48 business hours for a response.  Please remember that this is for non-urgent requests.  _______________________________________________________

## 2024-04-26 ENCOUNTER — Telehealth: Payer: Self-pay | Admitting: Physical Medicine and Rehabilitation

## 2024-04-26 NOTE — Telephone Encounter (Signed)
 Patient called and wants to know if she approved and if so how much it costs. CB#[phone]

## 2024-04-26 NOTE — Telephone Encounter (Signed)
 Procedure has been approved and scheduled by Mity (who sent this message to me).

## 2024-04-27 ENCOUNTER — Ambulatory Visit: Payer: Self-pay

## 2024-04-27 ENCOUNTER — Ambulatory Visit (INDEPENDENT_AMBULATORY_CARE_PROVIDER_SITE_OTHER): Admitting: Family Medicine

## 2024-04-27 ENCOUNTER — Other Ambulatory Visit: Payer: Self-pay | Admitting: Internal Medicine

## 2024-04-27 ENCOUNTER — Encounter: Payer: Self-pay | Admitting: Family Medicine

## 2024-04-27 ENCOUNTER — Encounter: Payer: Self-pay | Admitting: Internal Medicine

## 2024-04-27 VITALS — BP 136/58 | HR 86 | Temp 97.9°F | Resp 18 | Ht 59.0 in | Wt 149.8 lb

## 2024-04-27 DIAGNOSIS — I959 Hypotension, unspecified: Secondary | ICD-10-CM | POA: Insufficient documentation

## 2024-04-27 DIAGNOSIS — K219 Gastro-esophageal reflux disease without esophagitis: Secondary | ICD-10-CM

## 2024-04-27 NOTE — Telephone Encounter (Signed)
 FYI Only or Action Required?: FYI only for provider.  Patient was last seen in primary care on 04/14/2024 by Frann Mabel Mt, DO. Called Nurse Triage reporting Hypotension. Symptoms began several days ago. Interventions attempted: OTC medications: See note . Symptoms are: stable.  Triage Disposition: See HCP Within 4 Hours (Or PCP Triage) Scheduled.   Patient/caregiver understands and will follow disposition?: Yes        Copied from CRM [id number]. Topic: Clinical - Red Word Triage >> Apr 27, 2024  2:05 PM [place] E wrote: [facility] that prompted transfer to Nurse Triage: Low blood pressure. Patient's blood pressure this morning read 97/49 and then 98/46. Patient stated she is having low energy. Reason for Disposition  [1] Systolic BP 90-110 AND [2] taking blood pressure medications AND [3] dizzy, lightheaded or weak  Answer Assessment - Initial Assessment Questions 1. BLOOD PRESSURE: What is the blood pressure? Did you take at least two measurements 5 minutes apart?      -------------------- 110/50 ( during one of her appointment this week) -----------------97/49 ( prior to call)  ------------------98/46 ( prior to call )  -----------------------117/60      2. ONSET: When did you take your blood pressure?     ------------------ Today     3. HOW: How did you obtain the blood pressure? (e.g., visiting nurse, automatic home BP monitor)      -----------Home monitor: ARM OR WRIST CUFF     4. HISTORY: Do you have a history of low blood pressure? What is your blood pressure normally?       ---------Denies     5. MEDICINES: Are you taking any medications for blood pressure? If Yes, ask: Have they been changed recently?     --------------------- Amlodipine  ( for HTN)    6. PULSE RATE: Do you know what your pulse rate is?      ---------------------80   7. OTHER SYMPTOMS: Have you been sick recently? Have you had a recent injury?     -----   I dont feel like myself--  Reports feeling off balance.  Protocols used: Blood Pressure - Low-A-AH

## 2024-04-27 NOTE — Patient Instructions (Signed)
 How to Take Your Blood Pressure Blood pressure measures how strongly your blood is pressing against the walls of your arteries. Arteries are blood vessels that carry blood from your heart throughout your body. You can take your blood pressure at home with a machine. You may need to check your blood pressure at home: To check if you have high blood pressure (hypertension). To check your blood pressure over time. To make sure your blood pressure medicine is working. Supplies needed: Blood pressure machine, or monitor. A chair to sit in. This should be a chair where you can sit upright with your back supported. Do not sit on a soft couch or an armchair. Table or desk. Small notebook. Pencil or pen. How to prepare Avoid these things for 30 minutes before checking your blood pressure: Having drinks with caffeine in them, such as coffee or tea. Drinking alcohol. Eating. Smoking. Exercising. Do these things five minutes before checking your blood pressure: Go to the bathroom and pee (urinate). Sit in a chair. Be quiet. Do not talk. How to take your blood pressure Follow the instructions that came with your machine. If you have a digital blood pressure monitor, these may be the instructions: Sit up straight. Place your feet on the floor. Do not cross your ankles or legs. Rest your left arm at the level of your heart. You may rest it on a table, desk, or chair. Pull up your shirt sleeve. Wrap the blood pressure cuff around the upper part of your left arm. The cuff should be 1 inch (2.5 cm) above your elbow. It is best to wrap the cuff around bare skin. Fit the cuff snugly around your arm, but not too tightly. You should be able to place only one finger between the cuff and your arm. Place the cord so that it rests in the bend of your elbow. Press the power button. Sit quietly while the cuff fills with air and loses air. Write down the numbers on the screen. Wait 2-3 minutes and then repeat  steps 1-10. What do the numbers mean? Two numbers make up your blood pressure. The first number is called systolic pressure. The second is called diastolic pressure. An example of a blood pressure reading is "120 over 80" (or 120/80). If you are an adult and do not have a medical condition, use this guide to find out if your blood pressure is normal: Normal First number: below 120. Second number: below 80. Elevated First number: 120-129. Second number: below 80. Hypertension stage 1 First number: 130-139. Second number: 80-89. Hypertension stage 2 First number: 140 or above. Second number: 90 or above. Your blood pressure is above normal even if only the first or only the second number is above normal. Follow these instructions at home: Medicines Take over-the-counter and prescription medicines only as told by your doctor. Tell your doctor if your medicine is causing side effects. General instructions Check your blood pressure as often as your doctor tells you to. Check your blood pressure at the same time every day. Take your monitor to your next doctor's appointment. Your doctor will: Make sure you are using it correctly. Make sure it is working right. Understand what your blood pressure numbers should be. Keep all follow-up visits. General tips You will need a blood pressure machine or monitor. Your doctor can suggest a monitor. You can buy one at a drugstore or online. When choosing one: Choose one with an arm cuff. Choose one that wraps around your  upper arm. Only one finger should fit between your arm and the cuff. Do not choose one that measures your blood pressure from your wrist or finger. Where to find more information American Heart Association: www.heart.org Contact a doctor if: Your blood pressure keeps being high. Your blood pressure is suddenly low. Get help right away if: Your first blood pressure number is higher than 180. Your second blood pressure number is  higher than 120. These symptoms may be an emergency. Do not wait to see if the symptoms will go away. Get help right away. Call 911. Summary Check your blood pressure at the same time every day. Avoid caffeine, alcohol, smoking, and exercise for 30 minutes before checking your blood pressure. Make sure you understand what your blood pressure numbers should be. This information is not intended to replace advice given to you by your health care provider. Make sure you discuss any questions you have with your health care provider. Document Revised: 06/28/2021 Document Reviewed: 06/28/2021 Elsevier Patient Education  2024 [employer].

## 2024-04-27 NOTE — Assessment & Plan Note (Signed)
?   Secondary to bad batteries in pts bp monitor--- when we plugged it in it was much better 122/66 Con't to monitor bp  Return to office 3 weeks or sooner as needed for bp check

## 2024-04-27 NOTE — Progress Notes (Signed)
 Established Patient Office Visit  Subjective   Patient ID: Heidi Franklin, female    DOB: July 13, 1946  Age: 78 y.o. MRN: [id number]  No chief complaint on file.   HPI Discussed the use of AI scribe software for clinical note transcription with the patient, who gave verbal consent to proceed.  History of Present Illness Heidi Franklin is a 78 year old female who presents with concerns about fluctuating blood pressure readings.  She has been experiencing fluctuations in her blood pressure readings. During a recent visit to the gastroenterologist, her blood pressure was recorded at 100/50 mmHg. Upon returning home, she measured her blood pressure multiple times, noting readings of 125/45, 119/57, 91/50, and 98/55 mmHg. She suspected an issue with her blood pressure cuff, as her own measurements were lower than expected.  When she used a different cuff, her blood pressure was 136/58 mmHg, which is more consistent with her usual readings. She is concerned about the accuracy of her home blood pressure monitor, which has been giving inconsistent readings despite changing the batteries and using a plug-in power source.  She has been experiencing back pain and has been using pain medication, including a muscle relaxant, which she took half a dose of recently. She notes feeling sleepy after taking the muscle relaxant. No dizziness or lightheadedness.   Patient Active Problem List   Diagnosis Date Noted   Hypotension 04/27/2024   Coronary artery disease no hemodynamically significant stenosis based on coronary CT angio from March 2023 10/01/2022   Positive FIT (fecal immunochemical test) 09/25/2022   ALLERGIC RHINITIS 11/15/2021   Arthritis 11/15/2021   Asthma 11/15/2021   BRONCHITIS, CHRONIC 11/15/2021   Change in bowel habits 10/18/2021   Type 2 diabetes mellitus with hyperglycemia (HCC) 09/24/2021   Acute bilateral knee pain 09/24/2021   Vitiligo 09/07/2021   Pedal edema 03/27/2020   Body  mass index (BMI) 31.0-31.9, adult 11/08/2019   Disc displacement, lumbar 11/08/2019   Strain of lumbar paraspinal muscle 11/08/2019   History of chicken pox 04/11/2019   Vitamin D  deficiency 04/05/2019   Lumbar radiculopathy 03/31/2019   Neck pain 07/06/2018   Trapezius muscle strain, right, initial encounter 06/23/2018   Edema 06/23/2018   Gout 04/08/2018   Herpes simplex 02/10/2018   ETD (eustachian tube dysfunction) 02/10/2018   Peroneal tendinitis, right 01/06/2018   Psoriasis 11/14/2017   Acute bursitis of right shoulder 09/29/2017   Bilateral hip pain 07/10/2017   Bronchiectasis without complication (HCC) 06/25/2017   Irritable larynx syndrome 04/25/2017   Reflux laryngitis 04/23/2017   Benign lipomatous neoplasm of skin and subcutaneous tissue of right leg 02/25/2017   Anemia 02/05/2017   Fatigue 01/23/2017   Cough 01/02/2017   Multiple pulmonary nodules 11/10/2016   Chronic low back pain 10/09/2016   Degenerative arthritis of right knee 06/20/2016   Degenerative arthritis of left knee 04/10/2016   Peroneal tendinitis of left lower extremity 09/05/2015   Recurrent falls 08/08/2015   Bursitis of left shoulder 07/13/2015   Morbid obesity due to excess calories (HCC) 04/16/2015   Atypical chest pain 05/11/2011   Nonspecific abnormal electrocardiogram (ECG) (EKG) 05/11/2011   Hyperlipidemia associated with type 2 diabetes mellitus (HCC) 03/02/2010   Depression with anxiety 03/02/2010   Essential hypertension 03/02/2010   Chronic rhinitis 03/02/2010   Upper airway cough syndrome 03/02/2010   GERD 03/02/2010   Past Medical History:  Diagnosis Date   ALLERGIC RHINITIS    Allergy     Anxiety state, unspecified  Arthritis    Asthma    BRONCHITIS, CHRONIC    Cataract    b/l   Chronic low back pain 10/09/2016   COPD (chronic obstructive pulmonary disease) (HCC)    DEPRESSION    DIABETES MELLITUS, TYPE II    GERD    HYPERLIPIDEMIA    HYPERTENSION    Pneumonia     Psoriasis    Past Surgical History:  Procedure Laterality Date   ABDOMINAL HYSTERECTOMY  1980   CARPAL TUNNEL RELEASE Right    COLONOSCOPY     TRIGGER FINGER RELEASE Left    Index finger   UPPER GASTROINTESTINAL ENDOSCOPY     Social History   Tobacco Use   Smoking status: Never    Passive exposure: Never   Smokeless tobacco: Never  Vaping Use   Vaping status: Never Used  Substance Use Topics   Alcohol  use: No    Alcohol /week: 0.0 standard drinks of alcohol    Drug use: No   Social History   Socioeconomic History   Marital status: Married    Spouse name: Not on file   Number of children: 0   Years of education: 54yr colge   Highest education level: Some college, no degree  Occupational History   Occupation: Retired   Tobacco Use   Smoking status: Never    Passive exposure: Never   Smokeless tobacco: Never  Vaping Use   Vaping status: Never Used  Substance and Sexual Activity   Alcohol  use: No    Alcohol /week: 0.0 standard drinks of alcohol    Drug use: No   Sexual activity: Not Currently  Other Topics Concern   Not on file  Social History Narrative   Married, lives with spouse-retired from [employer] insurance   Not employed    Drinks coffee occasional, Consumes 1 soda a day    No dietary restrictions   Social Drivers of Corporate investment banker Strain: Low Risk  (12/22/2023)   Overall Financial Resource Strain (CARDIA)    Difficulty of Paying Living Expenses: Not very hard  Food Insecurity: Patient Declined (12/22/2023)   Hunger Vital Sign    Worried About Running Out of Food in the Last Year: Patient declined    Ran Out of Food in the Last Year: Patient declined  Transportation Needs: No Transportation Needs (12/22/2023)   PRAPARE - Administrator, Civil Service (Medical): No    Lack of Transportation (Non-Medical): No  Physical Activity: Insufficiently Active (12/22/2023)   Exercise Vital Sign    Days of Exercise per Week: 1 day    Minutes of  Exercise per Session: 20 min  Stress: Stress Concern Present (12/22/2023)   [employer] of Occupational Health - Occupational Stress Questionnaire    Feeling of Stress : To some extent  Social Connections: Socially Integrated (12/22/2023)   Social Connection and Isolation Panel    Frequency of Communication with Friends and Family: More than three times a week    Frequency of Social Gatherings with Friends and Family: Twice a week    Attends Religious Services: More than 4 times per year    Active Member of [employer] or Organizations: Yes    Attends Engineer, structural: More than 4 times per year    Marital Status: Married  Catering manager Violence: Not At Risk (05/05/2023)   Humiliation, Afraid, Rape, and Kick questionnaire    Fear of Current or Ex-Partner: No    Emotionally Abused: No    Physically Abused: No  Sexually Abused: No   Family Status  Relation Name Status   Mother 55 Deceased at age 21       CVA   Father 91 Deceased at age 1       DM complications   Other  (Not Specified)   Sister 72 Deceased   Brother 61 Deceased   MGM younger Deceased   MGF younger Deceased   PGM younger Deceased   PGF  Deceased   Brother 73 Deceased   Sister 33 Alive   Neg Hx  (Not Specified)  No partnership data on file   Family History  Problem Relation Age of Onset   Stroke Mother    Angina Mother    Hyperlipidemia Mother    Hypertension Mother    Heart disease Mother    Depression Mother    Diabetes Father    Hyperlipidemia Other        Parent   Hypertension Other        Parent   Diabetes Sister        x1   CAD Sister    COPD Sister        smoker   Hyperlipidemia Sister    Hypertension Sister    Diabetes Brother        x 2   Hyperlipidemia Brother    Hypertension Brother    Diabetes Brother    Hyperlipidemia Brother    Hypertension Brother    CAD Sister    Arthritis Sister    Hyperlipidemia Sister    Hypertension Sister    Colon cancer Neg Hx     Esophageal cancer Neg Hx    Rectal cancer Neg Hx    Stomach cancer Neg Hx    Pancreatic cancer Neg Hx    Allergies  Allergen Reactions   Metformin  And Related Diarrhea   Penicillins Rash      Review of Systems  Constitutional:  Negative for chills, fever and malaise/fatigue.  HENT:  Negative for congestion and hearing loss.   Eyes:  Negative for blurred vision and discharge.  Respiratory:  Negative for cough, sputum production and shortness of breath.   Cardiovascular:  Negative for chest pain, palpitations and leg swelling.  Gastrointestinal:  Negative for abdominal pain, blood in stool, constipation, diarrhea, heartburn, nausea and vomiting.  Genitourinary:  Negative for dysuria, frequency, hematuria and urgency.  Musculoskeletal:  Negative for back pain, falls and myalgias.  Skin:  Negative for rash.  Neurological:  Negative for dizziness, sensory change, loss of consciousness, weakness and headaches.  Endo/Heme/Allergies:  Negative for environmental allergies. Does not bruise/bleed easily.  Psychiatric/Behavioral:  Negative for depression and suicidal ideas. The patient is not nervous/anxious and does not have insomnia.       Objective:     BP (!) 136/58   Pulse 86   Temp 97.9 F (36.6 C)   Resp 18   Ht 4' 11 (1.499 m)   Wt 149 lb 12.8 oz (67.9 kg)   SpO2 96%   BMI 30.26 kg/m  BP Readings from Last 3 Encounters:  04/27/24 (!) 136/58  04/23/24 (!) 100/50  04/14/24 124/72   Wt Readings from Last 3 Encounters:  04/27/24 149 lb 12.8 oz (67.9 kg)  04/23/24 146 lb 2 oz (66.3 kg)  04/14/24 145 lb 12.8 oz (66.1 kg)   SpO2 Readings from Last 3 Encounters:  04/27/24 96%  04/14/24 95%  03/17/24 99%      Physical Exam Vitals and nursing note reviewed.  Constitutional:      General: She is not in acute distress.    Appearance: Normal appearance. She is well-developed.  HENT:     Head: Normocephalic and atraumatic.   Eyes:     General: No scleral icterus.        Right eye: No discharge.        Left eye: No discharge.    Cardiovascular:     Rate and Rhythm: Normal rate and regular rhythm.     Heart sounds: No murmur heard. Pulmonary:     Effort: Pulmonary effort is normal. No respiratory distress.     Breath sounds: Normal breath sounds.   Musculoskeletal:        General: Normal range of motion.     Cervical back: Normal range of motion and neck supple.     Right lower leg: No edema.     Left lower leg: No edema.   Skin:    General: Skin is warm and dry.   Neurological:     Mental Status: She is alert and oriented to person, place, and time.   Psychiatric:        Mood and Affect: Mood normal.        Behavior: Behavior normal.        Thought Content: Thought content normal.        Judgment: Judgment normal.      No results found for any visits on 04/27/24.  Last CBC Lab Results  Component Value Date   WBC 7.1 12/15/2023   HGB 12.2 12/15/2023   HCT 36.6 12/15/2023   MCV 96.1 12/15/2023   MCH 31.5 05/31/2022   RDW 13.4 12/15/2023   PLT 257.0 12/15/2023   Last metabolic panel Lab Results  Component Value Date   GLUCOSE 132 (H) 12/23/2023   NA 142 12/23/2023   K 3.6 12/23/2023   CL 105 12/23/2023   CO2 27 12/23/2023   BUN 15 12/23/2023   CREATININE 0.92 12/23/2023   GFR 59.53 (L) 12/23/2023   CALCIUM  9.5 12/23/2023   PROT 7.3 12/23/2023   ALBUMIN 4.1 12/23/2023   BILITOT 0.3 12/23/2023   ALKPHOS 50 12/23/2023   AST 20 12/23/2023   ALT 16 12/23/2023   ANIONGAP 5 05/31/2022   Last lipids Lab Results  Component Value Date   CHOL 132 12/15/2023   HDL 42.00 12/15/2023   LDLCALC 65 12/15/2023   LDLDIRECT 155.1 02/09/2013   TRIG 122.0 12/15/2023   CHOLHDL 3 12/15/2023   Last hemoglobin A1c Lab Results  Component Value Date   HGBA1C 6.5 12/15/2023   Last thyroid  functions Lab Results  Component Value Date   TSH 4.24 12/15/2023   Last vitamin D  Lab Results  Component Value Date   VD25OH 71.61  12/15/2023   Last vitamin B12 and Folate Lab Results  Component Value Date   VITAMINB12 340 07/05/2022      The 10-year ASCVD risk score (Arnett DK, et al., 2019) is: 25.8%    Assessment & Plan:   Problem List Items Addressed This Visit       Unprioritized   Hypotension - Primary   ? Secondary to bad batteries in pts bp monitor--- when we plugged it in it was much better 122/66 Con't to monitor bp  Return to office 3 weeks or sooner as needed for bp check     Well controlled, no changes to meds. Encouraged heart healthy diet such as the DASH diet and exercise as tolerated.  No follow-ups on file.    Sanaai Doane R Lowne Chase, DO

## 2024-04-27 NOTE — Progress Notes (Signed)
 HISTORY OF PRESENT ILLNESS:  Heidi Franklin is a 78 y.o. female with multiple medical problems who presents today regarding fecal soilage.  The patient was last seen in this office May 06, 2019 for regarding chronic cough.  See that dictation for details.  She underwent upper endoscopy, which was unremarkable.  She was treated with a course of PPI, but states this did not help.  Current complaint is that of minor fecal soiling over the past 3 weeks.  She reports that while sitting around she smelled gas.  When she went to the bathroom she noticed some soilage of her undergarments.  She has noticed the inability to discriminate between gas and feces.  No other complaints.  She did undergo complete colonoscopy February 2024.  The examination revealed a diminutive adenoma which was removed.  Otherwise normal  REVIEW OF SYSTEMS:  All non-GI ROS negative except for sinus and allergy , anxiety, arthritis, back pain, cough, fatigue, muscle cramps, night sweats, voice change, sleeping problems  Past Medical History:  Diagnosis Date   ALLERGIC RHINITIS    Allergy     Anxiety state, unspecified    Arthritis    Asthma    BRONCHITIS, CHRONIC    Cataract    b/l   Chronic low back pain 10/09/2016   COPD (chronic obstructive pulmonary disease) (HCC)    DEPRESSION    DIABETES MELLITUS, TYPE II    GERD    HYPERLIPIDEMIA    HYPERTENSION    Pneumonia    Psoriasis     Past Surgical History:  Procedure Laterality Date   ABDOMINAL HYSTERECTOMY  1980   CARPAL TUNNEL RELEASE Right    COLONOSCOPY     TRIGGER FINGER RELEASE Left    Index finger   UPPER GASTROINTESTINAL ENDOSCOPY      Social History Heidi Franklin  reports that she has never smoked. She has never been exposed to tobacco smoke. She has never used smokeless tobacco. She reports that she does not drink alcohol  and does not use drugs.  family history includes Angina in her mother; Arthritis in her sister; CAD in her sister and sister;  COPD in her sister; Depression in her mother; Diabetes in her brother, brother, father, and sister; Heart disease in her mother; Hyperlipidemia in her brother, brother, mother, sister, sister, and another family member; Hypertension in her brother, brother, mother, sister, sister, and another family member; Stroke in her mother.  Allergies  Allergen Reactions   Metformin  And Related Diarrhea   Penicillins Rash       PHYSICAL EXAMINATION: Vital signs: BP (!) 100/50 (BP Location: Left Arm, Patient Position: Sitting, Cuff Size: Normal)   Pulse 84   Ht 4' 11 (1.499 m) Comment: height measured without shoes  Wt 146 lb 2 oz (66.3 kg)   BMI 29.51 kg/m   Constitutional: generally well-appearing, no acute distress Psychiatric: alert and oriented x3, cooperative Eyes: extraocular movements intact, anicteric, conjunctiva pink Mouth: oral pharynx moist, no lesions Neck: supple no lymphadenopathy Cardiovascular: heart regular rate and rhythm, no murmur Lungs: clear to auscultation bilaterally Abdomen: soft, nontender, nondistended, no obvious ascites, no peritoneal signs, normal bowel sounds, no organomegaly Rectal: Omitted Extremities: no clubbing, cyanosis, or lower extremity edema bilaterally Skin: no lesions on visible extremities Neuro: No focal deficits.  Cranial nerves intact  ASSESSMENT:  1.  Minor fecal smearing with the inability to discriminate between gas and feces. 2.  Complete colonoscopy February 2024 with diminutive polyp.  Otherwise normal 3.  General Medical  problems   PLAN:  1.  Citrucel 2 tablespoons daily 2.  Wear protective undergarments or pads 3.  Return to the care of your PCP.  GI follow-up as needed Total time of 30 minutes was spent preparing to see the patient, obtaining comprehensive history, performing medically appropriate physical examination, counseling and educating the patient regarding the above listed issues, directing medical therapies, and  documenting clinical information in the health record

## 2024-05-04 ENCOUNTER — Encounter: Payer: Self-pay | Admitting: Family Medicine

## 2024-05-04 ENCOUNTER — Telehealth: Payer: Self-pay | Admitting: Internal Medicine

## 2024-05-04 ENCOUNTER — Ambulatory Visit (INDEPENDENT_AMBULATORY_CARE_PROVIDER_SITE_OTHER)

## 2024-05-04 VITALS — Ht 59.0 in | Wt 149.0 lb

## 2024-05-04 DIAGNOSIS — Z0000 Encounter for general adult medical examination without abnormal findings: Secondary | ICD-10-CM | POA: Diagnosis not present

## 2024-05-04 NOTE — Telephone Encounter (Signed)
 Called and spoke to patient. She has been unable to find the powder fors at 4 to 5 different stores ([facility], [facility], [employer], [employer])... [facility] [place] who said they typically carry it but they may be out of it. Advised patient that she can use the caplets instead of powder.

## 2024-05-04 NOTE — Progress Notes (Signed)
 Subjective:   Heidi Franklin is a 78 y.o. who presents for a Medicare Wellness preventive visit.  As a reminder, Annual Wellness Visits don't include a physical exam, and some assessments may be limited, especially if this visit is performed virtually. We may recommend an in-person follow-up visit with your provider if needed.  Visit Complete: Virtual I connected with  Erminio VEAR Cork on 05/04/24 by a video and audio enabled telemedicine application and verified that I am speaking with the correct person using two identifiers.  Patient Location: Home  Provider Location: Home Office  I discussed the limitations of evaluation and management by telemedicine. The patient expressed understanding and agreed to proceed.  Vital Signs: Because this visit was a virtual/telehealth visit, some criteria may be missing or patient reported. Any vitals not documented were not able to be obtained and vitals that have been documented are patient reported.  Persons Participating in Visit: Patient.  AWV Questionnaire: Yes: Patient Medicare AWV questionnaire was completed by the patient on 05/03/24; I have confirmed that all information answered by patient is correct and no changes since this date.  Cardiac Risk Factors include: advanced age (>59men, >41 women);diabetes mellitus;dyslipidemia;hypertension     Objective:    Today's Vitals   05/04/24 0849  Weight: 149 lb (67.6 kg)  Height: 4' 11 (1.499 m)   Body mass index is 30.09 kg/m.     05/04/2024    9:02 AM 05/05/2023    9:05 AM 05/31/2022    2:59 PM 05/01/2022    9:04 AM 04/25/2021    9:02 AM 07/04/2020    9:17 AM 09/07/2019   11:07 AM  Advanced Directives  Does Patient Have a Medical Advance Directive? No No No No No No No  Would patient like information on creating a medical advance directive? Yes (MAU/Ambulatory/Procedural Areas - Information given) No - Patient declined  No - Patient declined No - Patient declined No - Patient declined No -  Patient declined    Current Medications (verified) Outpatient Encounter Medications as of 05/04/2024  Medication Sig   acetaminophen  (TYLENOL ) 325 MG tablet Take 650 mg by mouth every 6 (six) hours as needed for mild pain or fever.   albuterol  (VENTOLIN  HFA) 108 (90 Base) MCG/ACT inhaler INHALE 1-2 PUFFS INTO THE LUNGS EVERY 6 (SIX) HOURS AS NEEDED FOR WHEEZING OR SHORTNESS OF BREATH.   ALPRAZolam  (XANAX ) 0.5 MG tablet TAKE 1 TABLET BY MOUTH TWICE A DAY AS NEEDED FOR ANXIETY (Patient taking differently: Take 0.5 mg by mouth at bedtime as needed for anxiety or sleep.)   amLODipine  (NORVASC ) 10 MG tablet TAKE 1 TABLET BY MOUTH EVERY DAY   aspirin  81 MG chewable tablet Chew 81 mg by mouth daily.   betamethasone  dipropionate (DIPROLENE ) 0.05 % ointment Apply 1 application  topically 2 (two) times daily.   budesonide -formoterol  (SYMBICORT ) 160-4.5 MCG/ACT inhaler Inhaler 2 puffs twice daily when in your yellow zone. Rinse mouth out after use.   cetirizine  (ZYRTEC ) 10 MG tablet Take 1 tablet (10 mg total) by mouth daily.   Cholecalciferol (VITAMIN D3) 50 MCG (2000 UT) capsule Take 2,000 Units by mouth daily.   erythromycin  ophthalmic ointment Place 1 Application into the left eye 2 (two) times daily.   famotidine  (PEPCID ) 40 MG tablet TAKE 1 TABLET BY MOUTH EVERY DAY   fluticasone  (CUTIVATE ) 0.05 % cream Apply 1 Application topically 2 (two) times daily.   fluticasone  (FLONASE ) 50 MCG/ACT nasal spray Place 2 sprays into both nostrils 2 (two)  times daily.   glimepiride  (AMARYL ) 1 MG tablet Take 1 tablet (1 mg total) by mouth daily with breakfast.   glucose blood (ONE TOUCH ULTRA TEST) test strip 1 each by Other route daily. Use 1 strips to check blood sugar twice a day Ex E11.9   HUMIRA  PEN-PS/UV/ADOL HS START 40 MG/0.8ML PNKT Inject 1 Dose into the skin every 14 (fourteen) days. Every 2 weeks   isosorbide  mononitrate (IMDUR ) 30 MG 24 hr tablet Take 1 tablet (30 mg total) by mouth daily.   ketoconazole   (NIZORAL ) 2 % cream Apply 1 Application topically daily as needed for irritation.   Lancets (ONETOUCH ULTRASOFT) lancets PRN (Patient taking differently: 1 each by Other route as needed for other (glucose check). PRN)   metoprolol  succinate (TOPROL -XL) 50 MG 24 hr tablet Take 1 tablet (50 mg total) by mouth daily.   mometasone (ELOCON) 0.1 % ointment Apply 1 application  topically daily.   montelukast  (SINGULAIR ) 10 MG tablet TAKE 1 TABLET BY MOUTH EVERYDAY AT BEDTIME   Multiple Vitamins-Minerals (CENTRUM SILVER PO) Take 1 tablet by mouth every morning.   nitroGLYCERIN  (NITROSTAT ) 0.4 MG SL tablet Place 1 tablet (0.4 mg total) under the tongue every 5 (five) minutes as needed. (Patient taking differently: Place 0.4 mg under the tongue every 5 (five) minutes as needed for chest pain.)   pantoprazole  (PROTONIX ) 40 MG tablet TAKE 1 TABLET BY MOUTH TWICE A DAY   Potassium Chloride  ER 20 MEQ TBCR Take 1 tablet (20 mEq total) by mouth daily. TAKE 1 TABLET BY MOUTH EVERY DAY   rosuvastatin  (CRESTOR ) 40 MG tablet Take 1 tablet (40 mg total) by mouth daily.   triamcinolone  cream (KENALOG ) 0.1 % Apply 1 Application topically 2 (two) times daily as needed (rash).   valACYclovir  (VALTREX ) 500 MG tablet Take 500 mg by mouth 2 (two) times daily.   No facility-administered encounter medications on file as of 05/04/2024.    Allergies (verified) Metformin  and related and Penicillins   History: Past Medical History:  Diagnosis Date   ALLERGIC RHINITIS    Allergy     Anxiety state, unspecified    Arthritis    Asthma    BRONCHITIS, CHRONIC    Cataract    b/l   Chronic low back pain 10/09/2016   COPD (chronic obstructive pulmonary disease) (HCC)    DEPRESSION    DIABETES MELLITUS, TYPE II    GERD    HYPERLIPIDEMIA    HYPERTENSION    Pneumonia    Psoriasis    Past Surgical History:  Procedure Laterality Date   ABDOMINAL HYSTERECTOMY  1980   CARPAL TUNNEL RELEASE Right    COLONOSCOPY     TRIGGER  FINGER RELEASE Left    Index finger   UPPER GASTROINTESTINAL ENDOSCOPY     Family History  Problem Relation Age of Onset   Stroke Mother    Angina Mother    Hyperlipidemia Mother    Hypertension Mother    Heart disease Mother    Depression Mother    Diabetes Father    Hyperlipidemia Other        Parent   Hypertension Other        Parent   Diabetes Sister        x1   CAD Sister    COPD Sister        smoker   Hyperlipidemia Sister    Hypertension Sister    Diabetes Brother        x 2  Hyperlipidemia Brother    Hypertension Brother    Diabetes Brother    Hyperlipidemia Brother    Hypertension Brother    CAD Sister    Arthritis Sister    Hyperlipidemia Sister    Hypertension Sister    Colon cancer Neg Hx    Esophageal cancer Neg Hx    Rectal cancer Neg Hx    Stomach cancer Neg Hx    Pancreatic cancer Neg Hx    Social History   Socioeconomic History   Marital status: Married    Spouse name: Not on file   Number of children: 0   Years of education: 68yr colge   Highest education level: Some college, no degree  Occupational History   Occupation: Retired   Tobacco Use   Smoking status: Never    Passive exposure: Never   Smokeless tobacco: Never  Vaping Use   Vaping status: Never Used  Substance and Sexual Activity   Alcohol  use: No    Alcohol /week: 0.0 standard drinks of alcohol    Drug use: No   Sexual activity: Not Currently  Other Topics Concern   Not on file  Social History Narrative   Married, lives with spouse-retired from [employer] insurance   Not employed    Drinks coffee occasional, Consumes 1 soda a day    No dietary restrictions   Social Drivers of Corporate investment banker Strain: Medium Risk (05/03/2024)   Overall Financial Resource Strain (CARDIA)    Difficulty of Paying Living Expenses: Somewhat hard  Food Insecurity: No Food Insecurity (05/03/2024)   Hunger Vital Sign    Worried About Running Out of Food in the Last Year: Never true    Ran  Out of Food in the Last Year: Never true  Transportation Needs: No Transportation Needs (05/03/2024)   PRAPARE - Administrator, Civil Service (Medical): No    Lack of Transportation (Non-Medical): No  Physical Activity: Insufficiently Active (05/03/2024)   Exercise Vital Sign    Days of Exercise per Week: 1 day    Minutes of Exercise per Session: 20 min  Stress: No Stress Concern Present (05/03/2024)   [employer] of Occupational Health - Occupational Stress Questionnaire    Feeling of Stress: Only a little  Social Connections: Socially Integrated (05/03/2024)   Social Connection and Isolation Panel    Frequency of Communication with Friends and Family: More than three times a week    Frequency of Social Gatherings with Friends and Family: Once a week    Attends Religious Services: More than 4 times per year    Active Member of [employer] or Organizations: Yes    Attends Engineer, structural: More than 4 times per year    Marital Status: Married    Tobacco Counseling Counseling given: Not Answered    Clinical Intake:  Pre-visit preparation completed: Yes  Pain : No/denies pain     Diabetes: Yes CBG done?: No Did pt. bring in CBG monitor from home?: No  Lab Results  Component Value Date   HGBA1C 6.5 12/15/2023   HGBA1C 6.6 (H) 08/11/2023   HGBA1C 6.9 (H) 06/27/2023     How often do you need to have someone help you when you read instructions, pamphlets, or other written materials from your doctor or pharmacy?: 1 - Never  Interpreter Needed?: No  Information entered by :: Charmaine Bloodgood LPN   Activities of Daily Living     05/03/2024   12:46 PM  In your  present state of health, do you have any difficulty performing the following activities:  Hearing? 0  Vision? 0  Difficulty concentrating or making decisions? 0  Walking or climbing stairs? 0  Dressing or bathing? 0  Doing errands, shopping? 0  Preparing Food and eating ? N  Using the Toilet?  N  In the past six months, have you accidently leaked urine? Y  Do you have problems with loss of bowel control? Y  Managing your Medications? N  Managing your Finances? N  Housekeeping or managing your Housekeeping? N    Patient Care Team: Domenica Harlene LABOR, MD as PCP - General (Family Medicine) Abran Norleen SAILOR, MD (Gastroenterology) Buck Saucer, MD (Neurology) Clois Fret, MD as Consulting Physician (Neurosurgery) Eldonna Novel, MD as Consulting Physician (Physical Medicine and Rehabilitation) Camillo Golas, MD as Attending Physician (Ophthalmology)  I have updated your Care Teams any recent Medical Services you may have received from other providers in the past year.     Assessment:   This is a routine wellness examination for Anaiza.  Hearing/Vision screen Hearing Screening - Comments:: Denies hearing difficulties   Vision Screening - Comments:: Wears rx glasses - up to date with routine eye exams with [facility]     Goals Addressed             This Visit's Progress    Remain active and independent   On track      Depression Screen     05/04/2024    9:00 AM 06/27/2023   10:44 AM 05/05/2023    9:04 AM 07/04/2022    8:38 AM 06/11/2022   11:34 AM 05/01/2022    9:10 AM 05/01/2022    9:05 AM  PHQ 2/9 Scores  PHQ - 2 Score 0 0 0 0 1 3 3   PHQ- 9 Score    0 3 4 4     Fall Risk     05/03/2024   12:46 PM 06/27/2023   10:44 AM 05/03/2023    9:46 AM 07/04/2022    8:38 AM 06/11/2022   11:34 AM  Fall Risk   Falls in the past year? 1 1 0 0 0  Number falls in past yr: 0 0 0 0 0  Injury with Fall? 0 0 0 0 0  Risk for fall due to : No Fall Risks History of fall(s) No Fall Risks    Follow up Falls evaluation completed;Education provided;Falls prevention discussed Falls evaluation completed Falls evaluation completed Falls evaluation completed       Data saved with a previous flowsheet row definition    MEDICARE RISK AT HOME:  Medicare Risk at Home Any stairs in or around  the home?: Yes If so, are there any without handrails?: (Patient-Rptd) Yes Home free of loose throw rugs in walkways, pet beds, electrical cords, etc?: (Patient-Rptd) Yes Adequate lighting in your home to reduce risk of falls?: (Patient-Rptd) Yes Life alert?: (Patient-Rptd) No Use of a cane, walker or w/c?: (Patient-Rptd) No Grab bars in the bathroom?: (Patient-Rptd) No Shower chair or bench in shower?: (Patient-Rptd) No Elevated toilet seat or a handicapped toilet?: (Patient-Rptd) No  TIMED UP AND GO:  Was the test performed?  No  Cognitive Function: Declined/Normal: No cognitive concerns noted by patient or family. Patient alert, oriented, able to answer questions appropriately and recall recent events. No signs of memory loss or confusion.    01/20/2018   11:26 AM 08/07/2016   10:50 AM  MMSE - Mini Mental State Exam  Not completed:  --  Orientation to time 5   Orientation to Place 5   Registration 3   Attention/ Calculation 5   Recall 2   Language- name 2 objects 2   Language- repeat 1   Language- follow 3 step command 3   Language- read & follow direction 1   Write a sentence 1   Copy design 1   Total score 29         05/05/2023    9:05 AM 05/01/2022    9:14 AM  6CIT Screen  What Year? 0 points 0 points  What month? 0 points 0 points  What time? 0 points 0 points  Count back from 20 0 points 0 points  Months in reverse 0 points 4 points  Repeat phrase 0 points 0 points  Total Score 0 points 4 points    Immunizations Immunization History  Administered Date(s) Administered   Fluad Quad(high Dose 65+) 06/25/2019, 07/14/2020, 07/17/2021   Influenza Split 08/20/2011, 07/30/2012, 06/28/2014   Influenza Whole 07/28/2009, 07/06/2010   Influenza, High Dose Seasonal PF 06/19/2018, 06/18/2022, 07/03/2023   Influenza, Seasonal, Injecte, Preservative Fre 06/25/2019   Influenza,inj,Quad PF,6+ Mos 06/23/2013, 07/05/2015, 06/20/2016, 06/23/2017   Influenza-Unspecified  06/28/2014   Moderna Covid-19 Vaccine Bivalent Booster 78yrs & up 09/07/2021   Moderna Sars-Covid-2 Vaccination 11/27/2019, 12/27/2019, 09/12/2020   Pfizer(Comirnaty)Fall Seasonal Vaccine 12 years and older 07/05/2023   Pneumococcal Conjugate-13 06/15/2015   Pneumococcal Polysaccharide-23 10/02/2011   Td 11/23/2013   Zoster Recombinant(Shingrix) 01/22/2022, 06/18/2022    Screening Tests Health Maintenance  Topic Date Due   DTaP/Tdap/Td (2 - Tdap) 11/24/2023   COVID-19 Vaccine (6 - Moderna risk 2024-25 season) 04/16/2025 (Originally 01/02/2024)   INFLUENZA VACCINE  05/28/2024   HEMOGLOBIN A1C  06/13/2024   FOOT EXAM  06/26/2024   OPHTHALMOLOGY EXAM  09/30/2024   Diabetic kidney evaluation - Urine ACR  12/14/2024   Diabetic kidney evaluation - eGFR measurement  12/22/2024   Medicare Annual Wellness (AWV)  05/04/2025   Pneumococcal Vaccine: 50+ Years  Completed   DEXA SCAN  Completed   Hepatitis C Screening  Completed   Zoster Vaccines- Shingrix  Completed   Hepatitis B Vaccines  Aged Out   HPV VACCINES  Aged Out   Meningococcal B Vaccine  Aged Out   Colonoscopy  Discontinued    Health Maintenance  Health Maintenance Due  Topic Date Due   DTaP/Tdap/Td (2 - Tdap) 11/24/2023    Additional Screening:  Vision Screening: Recommended annual ophthalmology exams for early detection of glaucoma and other disorders of the eye. Would you like a referral to an eye doctor? No    Dental Screening: Recommended annual dental exams for proper oral hygiene  Community Resource Referral / Chronic Care Management: CRR required this visit?  No   CCM required this visit?  No   Plan:    I have personally reviewed and noted the following in the patient's chart:   Medical and social history Use of alcohol , tobacco or illicit drugs  Current medications and supplements including opioid prescriptions. Patient is not currently taking opioid prescriptions. Functional ability and  status Nutritional status Physical activity Advanced directives List of other physicians Hospitalizations, surgeries, and ER visits in previous 12 months Vitals Screenings to include cognitive, depression, and falls Referrals and appointments  In addition, I have reviewed and discussed with patient certain preventive protocols, quality metrics, and best practice recommendations. A written personalized care plan for preventive services as well as general preventive  health recommendations were provided to patient.   Lavelle Pfeiffer [place], [place]   11/28/7972   After Visit Summary: (MyChart) Due to this being a telephonic visit, the after visit summary with patients personalized plan was offered to patient via MyChart   Notes: PCP Follow Up Recommendations: Patient complains of still having problems with chronic cough and hoarseness; saw ENT early in the year.

## 2024-05-04 NOTE — Patient Instructions (Signed)
 Heidi Franklin , Thank you for taking time out of your busy schedule to complete your Annual Wellness Visit with me. I enjoyed our conversation and look forward to speaking with you again next year. I, as well as your care team,  appreciate your ongoing commitment to your health goals. Please review the following plan we discussed and let me know if I can assist you in the future. Your Game plan/ To Do List    Follow up Visits: Next Medicare AWV with our clinical staff: In 1 year    Have you seen your provider in the last 6 months (3 months if uncontrolled diabetes)? Yes Next Office Visit with your provider: 07/12/24 @ 2:20  Clinician Recommendations:  Aim for 30 minutes of exercise or brisk walking, 6-8 glasses of water, and 5 servings of fruits and vegetables each day.       This is a list of the screening recommended for you and due dates:  Health Maintenance  Topic Date Due   DTaP/Tdap/Td vaccine (2 - Tdap) 11/24/2023   COVID-19 Vaccine (6 - Moderna risk 2024-25 season) 04/16/2025*   Flu Shot  05/28/2024   Hemoglobin A1C  06/13/2024   Complete foot exam   06/26/2024   Eye exam for diabetics  09/30/2024   Yearly kidney health urinalysis for diabetes  12/14/2024   Yearly kidney function blood test for diabetes  12/22/2024   Medicare Annual Wellness Visit  05/04/2025   Pneumococcal Vaccine for age over 75  Completed   DEXA scan (bone density measurement)  Completed   Hepatitis C Screening  Completed   Zoster (Shingles) Vaccine  Completed   Hepatitis B Vaccine  Aged Out   HPV Vaccine  Aged Out   Meningitis B Vaccine  Aged Out   Colon Cancer Screening  Discontinued  *Topic was postponed. The date shown is not the original due date.    Advanced directives: (ACP Link)Information on Advanced Care Planning can be found at [place]  Secretary of [facility] Advance Health Care Directives Advance Health Care Directives. [email or web address]   Advance Care Planning is important because it:  [x]  Makes  sure you receive the medical care that is consistent with your values, goals, and preferences  [x]  It provides guidance to your family and loved ones and reduces their decisional burden about whether or not they are making the right decisions based on your wishes.  Follow the link provided in your after visit summary or read over the paperwork we have mailed to you to help you started getting your Advance Directives in place. If you need assistance in completing these, please reach out to us  so that we can help you!  See attachments for Preventive Care and Fall Prevention Tips.

## 2024-05-04 NOTE — Telephone Encounter (Signed)
 Patient called and stated that she is needing to speak to with Dr. Abran PA or nurse regarding an over the counter medication called Citrucel. Patient stated that she was unable to find the adult powder form instead she found capsule. Patient is requesting a call back. Please advise.

## 2024-05-13 ENCOUNTER — Other Ambulatory Visit: Payer: Self-pay | Admitting: Family Medicine

## 2024-05-17 ENCOUNTER — Encounter: Payer: Self-pay | Admitting: Family Medicine

## 2024-05-18 ENCOUNTER — Ambulatory Visit: Admitting: Physical Medicine and Rehabilitation

## 2024-05-18 ENCOUNTER — Other Ambulatory Visit: Payer: Self-pay

## 2024-05-18 DIAGNOSIS — M47816 Spondylosis without myelopathy or radiculopathy, lumbar region: Secondary | ICD-10-CM | POA: Diagnosis not present

## 2024-05-18 MED ORDER — BUPIVACAINE HCL 0.5 % IJ SOLN
3.0000 mL | Freq: Once | INTRAMUSCULAR | Status: AC
  Administered 2024-05-18: 3 mL

## 2024-05-18 NOTE — Progress Notes (Signed)
 Pain Scale   Average Pain 8 Patient advising she has chronic lower back pain radiating to bilateral hip area. Patient advising her pain is worse in am and when walking, when resting she has some relief.        +Driver, -BT, -Dye Allergies.

## 2024-05-18 NOTE — Patient Instructions (Signed)

## 2024-05-20 DIAGNOSIS — L400 Psoriasis vulgaris: Secondary | ICD-10-CM | POA: Diagnosis not present

## 2024-05-23 NOTE — Procedures (Signed)
 Lumbar Diagnostic Facet Joint Nerve Block with Fluoroscopic Guidance   [name]      Date of Birth: 04-06-1946 MRN: [id number] PCP: Domenica Harlene LABOR, MD      Visit Date: 05/18/2024   Universal Protocol:    Date/Time: 07/27/254:11 PM  Consent Given By: the patient  Position: PRONE  Additional Comments: Vital signs were monitored before and after the procedure. Patient was prepped and draped in the usual sterile fashion. The correct patient, procedure, and site was verified.   Injection Procedure Details:   Procedure diagnoses:  1. Spondylosis without myelopathy or radiculopathy, lumbar region      Meds Administered:  Meds ordered this encounter  Medications   bupivacaine  (MARCAINE ) 0.5 % (with pres) injection 3 mL     Laterality: Bilateral  Location/Site: L3-L4, L2 and L3 medial branches, L4-L5, L3 and L4 medial branches, and L5-S1, L4 medial branch and L5 dorsal ramus  Needle: 5.0 in., 25 ga.  Short bevel or Quincke spinal needle  Needle Placement: Oblique pedical  Findings:   -Comments: There was excellent flow of contrast along the articular pillars without intravascular flow.  Procedure Details: The fluoroscope beam is vertically oriented in AP and then obliqued 15 to 20 degrees to the ipsilateral side of the desired nerve to achieve the "Scotty dog" appearance.  The skin over the target area of the junction of the superior articulating process and the transverse process (sacral ala if blocking the L5 dorsal rami) was locally anesthetized with a 1 ml volume of 1% Lidocaine without Epinephrine.  The spinal needle was inserted and advanced in a trajectory view down to the target.   After contact with periosteum and negative aspirate for blood and CSF, correct placement without intravascular or epidural spread was confirmed by injecting 0.5 ml. of Isovue -250.  A spot radiograph was obtained of this image.    Next, a 0.5 ml. volume of the injectate  described above was injected. The needle was then redirected to the other facet joint nerves mentioned above if needed.  Prior to the procedure, the patient was given a Pain Diary which was completed for baseline measurements.  After the procedure, the patient rated their pain every 30 minutes and will continue rating at this frequency for a total of 5 hours.  The patient has been asked to complete the Diary and return to us  by mail, fax or hand delivered as soon as possible.   Additional Comments:  No complications occurred Dressing: 2 x 2 sterile gauze and Band-Aid    Post-procedure details: Patient was observed during the procedure. Post-procedure instructions were reviewed.  Patient left the clinic in stable condition.

## 2024-05-23 NOTE — Progress Notes (Signed)
 Heidi Franklin - 78 y.o. female MRN [id number]  Date of birth: 02/12/1946  Office Visit Note: Visit Date: 05/18/2024 PCP: Domenica Harlene LABOR, MD Referred by: Domenica Harlene LABOR, MD  Subjective: Chief Complaint  Patient presents with   Lower Back - Pain   HPI:  Heidi Franklin is a 78 y.o. female who comes in today at the request of Duwaine Pouch, FNP for planned Bilateral  L3-4, L4-5, and L5-S1 Lumbar facet/medial branch block with fluoroscopic guidance.  The patient has failed conservative care including home exercise, medications, time and activity modification.  This injection will be diagnostic and hopefully therapeutic.  Please see requesting physician notes for further details and justification.  Exam has shown concordant pain with facet joint loading.   ROS Otherwise per HPI.  Assessment & Plan: Visit Diagnoses:    ICD-10-CM   1. Spondylosis without myelopathy or radiculopathy, lumbar region  M47.816 XR C-ARM NO REPORT    Facet Injection    bupivacaine  (MARCAINE ) 0.5 % (with pres) injection 3 mL      Plan: No additional findings.   Meds & Orders:  Meds ordered this encounter  Medications   bupivacaine  (MARCAINE ) 0.5 % (with pres) injection 3 mL    Orders Placed This Encounter  Procedures   Facet Injection   XR C-ARM NO REPORT    Follow-up: Return for Review Pain Diary.   Procedures: No procedures performed  Lumbar Diagnostic Facet Joint Nerve Block with Fluoroscopic Guidance   [name]      Date of Birth: 07/09/1946 MRN: [id number] PCP: Domenica Harlene LABOR, MD      Visit Date: 05/18/2024   Universal Protocol:    Date/Time: 07/27/254:11 PM  Consent Given By: the patient  Position: PRONE  Additional Comments: Vital signs were monitored before and after the procedure. Patient was prepped and draped in the usual sterile fashion. The correct patient, procedure, and site was verified.   Injection Procedure Details:   Procedure diagnoses:  1.  Spondylosis without myelopathy or radiculopathy, lumbar region      Meds Administered:  Meds ordered this encounter  Medications   bupivacaine  (MARCAINE ) 0.5 % (with pres) injection 3 mL     Laterality: Bilateral  Location/Site: L3-L4, L2 and L3 medial branches, L4-L5, L3 and L4 medial branches, and L5-S1, L4 medial branch and L5 dorsal ramus  Needle: 5.0 in., 25 ga.  Short bevel or Quincke spinal needle  Needle Placement: Oblique pedical  Findings:   -Comments: There was excellent flow of contrast along the articular pillars without intravascular flow.  Procedure Details: The fluoroscope beam is vertically oriented in AP and then obliqued 15 to 20 degrees to the ipsilateral side of the desired nerve to achieve the "Scotty dog" appearance.  The skin over the target area of the junction of the superior articulating process and the transverse process (sacral ala if blocking the L5 dorsal rami) was locally anesthetized with a 1 ml volume of 1% Lidocaine without Epinephrine.  The spinal needle was inserted and advanced in a trajectory view down to the target.   After contact with periosteum and negative aspirate for blood and CSF, correct placement without intravascular or epidural spread was confirmed by injecting 0.5 ml. of Isovue -250.  A spot radiograph was obtained of this image.    Next, a 0.5 ml. volume of the injectate described above was injected. The needle was then redirected to the other facet joint nerves mentioned above if needed.  Prior  to the procedure, the patient was given a Pain Diary which was completed for baseline measurements.  After the procedure, the patient rated their pain every 30 minutes and will continue rating at this frequency for a total of 5 hours.  The patient has been asked to complete the Diary and return to us  by mail, fax or hand delivered as soon as possible.   Additional Comments:  No complications occurred Dressing: 2 x 2 sterile gauze and  Band-Aid    Post-procedure details: Patient was observed during the procedure. Post-procedure instructions were reviewed.  Patient left the clinic in stable condition.   Clinical History: Narrative & Impression CLINICAL DATA:  Left low back and hip region pain.   EXAM: LUMBAR MYELOGRAM   FLUOROSCOPY: 0 minutes 48 seconds.  210.60 micro gray meter squared.   PROCEDURE: After thorough discussion of risks and benefits of the procedure including bleeding, infection, injury to nerves, blood vessels, adjacent structures as well as headache and CSF leak, written and oral informed consent was obtained. Consent was obtained by Dr. Oneil Officer. Time out form was completed.   Patient was positioned prone on the fluoroscopy table. Local anesthesia was provided with 1% lidocaine without epinephrine after prepped and draped in the usual sterile fashion. Puncture was performed at L3-4 using a 3 1/2 inch 22-gauge spinal needle via right paramedian approach. Using a single pass through the dura, the needle was placed within the thecal sac, with return of clear CSF. 15 mL of Isovue  M-200 was injected into the thecal sac, with normal opacification of the nerve roots and cauda equina consistent with free flow within the subarachnoid space.   I personally performed the lumbar puncture and administered the intrathecal contrast. I also personally performed acquisition of the myelogram images.   TECHNIQUE: Contiguous axial images were obtained through the Lumbar spine after the intrathecal infusion of infusion. Coronal and sagittal reconstructions were obtained of the axial image sets.   COMPARISON:  Prior myelogram study 05/13/2019   FINDINGS: LUMBAR MYELOGRAM FINDINGS:   Mild scoliotic curvature convex to the right. Minimal anterior extradural defects at L2-3, L3-4, L4-5 and L5-S1 consistent with disc bulges. Mild stenosis of the left lateral recess at L2-3 and L3-4 and of both lateral  recesses at L4-5 left more right standing flexion extension views do not show any abnormal motion or gross change.   CT LUMBAR MYELOGRAM FINDINGS:   T12-L1: Normal   L1-2: Normal   L2-3: Mild bulging of the disc more towards the left. Mild narrowing the left lateral recess and intervertebral foramen on the left but no likely neural compression.   L3-4: Bulging of the disc slightly more prominent towards the left. Mild narrowing of the left lateral recess and of the intervertebral foramen on the left that could possibly cause neural compression or irritation.   L4-5: Bulging of the disc more prominent towards the left. Facet and ligamentous hypertrophy more prominent on the left. Narrowing of both lateral recesses and foramina, left more than right. Neural compression could occur at this level, more likely on left.   L5-S1: Mild bulging of the disc. Mild bilateral facet osteoarthritis. No apparent compressive narrowing of the canal or foramina.   Mild bilateral sacroiliac osteoarthritis is noted.   Findings in general appears similar to the examination of 2020.   IMPRESSION: 1. L4-5: Bulging of the disc more prominent towards the left. Facet and ligamentous hypertrophy more prominent on the left. Narrowing of both lateral recesses and foramina, left  more than right. Neural compression could occur at this level, more likely on the left. 2. L3-4: Bulging of the disc more prominent towards the left. Mild narrowing of the left lateral recess and intervertebral foramen on the left that could possibly cause neural compression or irritation. 3. L2-3: Mild bulging of the disc more prominent towards the left. Mild narrowing of the left lateral recess and intervertebral foramen on the left but no likely neural compression. 4. L5-S1: Mild bulging of the disc. Mild bilateral facet osteoarthritis. No apparent compressive narrowing of the canal or foramina. 5. Mild bilateral sacroiliac  osteoarthritis.     Electronically Signed   By: Oneil Officer M.D.   On: 06/25/2023 10:01     Objective:  VS:  HT:    WT:   BMI:     BP:   HR: bpm  TEMP: ( )  RESP:  Physical Exam Vitals and nursing note reviewed.  Constitutional:      General: She is not in acute distress.    Appearance: Normal appearance. She is well-developed. She is not ill-appearing.  HENT:     Head: Normocephalic and atraumatic.  Eyes:     Conjunctiva/sclera: Conjunctivae normal.     Pupils: Pupils are equal, round, and reactive to light.  Cardiovascular:     Rate and Rhythm: Normal rate.     Pulses: Normal pulses.  Pulmonary:     Effort: Pulmonary effort is normal.  Musculoskeletal:     Right lower leg: No edema.     Left lower leg: No edema.  Skin:    General: Skin is warm and dry.     Findings: No erythema or rash.  Neurological:     General: No focal deficit present.     Mental Status: She is alert and oriented to person, place, and time.     Sensory: No sensory deficit.     Motor: No abnormal muscle tone.     Coordination: Coordination normal.     Gait: Gait normal.  Psychiatric:        Mood and Affect: Mood normal.        Behavior: Behavior normal.      Imaging: No results found.

## 2024-06-02 ENCOUNTER — Telehealth: Payer: Self-pay | Admitting: Neurosurgery

## 2024-06-02 DIAGNOSIS — M545 Low back pain, unspecified: Secondary | ICD-10-CM

## 2024-06-02 DIAGNOSIS — M533 Sacrococcygeal disorders, not elsewhere classified: Secondary | ICD-10-CM

## 2024-06-02 NOTE — Telephone Encounter (Signed)
 Patient is calling to let our office know that she is having a lot of back pain and rates it at an 8/10 and states it begins when she gets up in the morning and the pain moves throughout the day. She states that she had the epidural did not help her pain and does not know what she needs to do. Please advise.

## 2024-06-03 NOTE — Telephone Encounter (Signed)
 Patient advised. Referral placed to pain management in [place]. Appointment on 06/15/24 cancelled.

## 2024-06-03 NOTE — Telephone Encounter (Signed)
 Patient has an appointment on 06/15/2024, any recommendations in the meantime or should patient be seen sooner?

## 2024-06-15 ENCOUNTER — Ambulatory Visit: Admitting: Neurosurgery

## 2024-06-15 NOTE — Progress Notes (Unsigned)
 [facility] [address] [place], [place] [address] [phone]  Date:  06/16/2024   Name:  Heidi Franklin   DOB:  Sep 02, 1946   MRN:  [id number]  PCP:  Domenica Harlene LABOR, MD    Chief Complaint: No chief complaint on file.   History of Present Illness:  Heidi Franklin is a 78 y.o. very pleasant female patient who presents with the following:  Pt of Dr Domenica sen today with concern of cough and cold symptoms It looks like she reached out via mychart about these concerns about a month ago and was asked to schedule an appt She has diabetes, hypertension   Patient Active Problem List   Diagnosis Date Noted   Hypotension 04/27/2024   Coronary artery disease no hemodynamically significant stenosis based on coronary CT angio from March 2023 10/01/2022   Positive FIT (fecal immunochemical test) 09/25/2022   ALLERGIC RHINITIS 11/15/2021   Arthritis 11/15/2021   Asthma 11/15/2021   BRONCHITIS, CHRONIC 11/15/2021   Change in bowel habits 10/18/2021   Type 2 diabetes mellitus with hyperglycemia (HCC) 09/24/2021   Acute bilateral knee pain 09/24/2021   Vitiligo 09/07/2021   Pedal edema 03/27/2020   Body mass index (BMI) 31.0-31.9, adult 11/08/2019   Disc displacement, lumbar 11/08/2019   Strain of lumbar paraspinal muscle 11/08/2019   History of chicken pox 04/11/2019   Vitamin D  deficiency 04/05/2019   Lumbar radiculopathy 03/31/2019   Neck pain 07/06/2018   Trapezius muscle strain, right, initial encounter 06/23/2018   Edema 06/23/2018   Gout 04/08/2018   Herpes simplex 02/10/2018   ETD (eustachian tube dysfunction) 02/10/2018   Peroneal tendinitis, right 01/06/2018   Psoriasis 11/14/2017   Acute bursitis of right shoulder 09/29/2017   Bilateral hip pain 07/10/2017   Bronchiectasis without complication (HCC) 06/25/2017   Irritable larynx syndrome 04/25/2017   Reflux laryngitis 04/23/2017   Benign lipomatous neoplasm  of skin and subcutaneous tissue of right leg 02/25/2017   Anemia 02/05/2017   Fatigue 01/23/2017   Cough 01/02/2017   Multiple pulmonary nodules 11/10/2016   Chronic low back pain 10/09/2016   Degenerative arthritis of right knee 06/20/2016   Degenerative arthritis of left knee 04/10/2016   Peroneal tendinitis of left lower extremity 09/05/2015   Recurrent falls 08/08/2015   Bursitis of left shoulder 07/13/2015   Morbid obesity due to excess calories (HCC) 04/16/2015   Atypical chest pain 05/11/2011   Nonspecific abnormal electrocardiogram (ECG) (EKG) 05/11/2011   Hyperlipidemia associated with type 2 diabetes mellitus (HCC) 03/02/2010   Depression with anxiety 03/02/2010   Essential hypertension 03/02/2010   Chronic rhinitis 03/02/2010   Upper airway cough syndrome 03/02/2010   GERD 03/02/2010    Past Medical History:  Diagnosis Date   ALLERGIC RHINITIS    Allergy     Anxiety state, unspecified    Arthritis    Asthma    BRONCHITIS, CHRONIC    Cataract    b/l   Chronic low back pain 10/09/2016   COPD (chronic obstructive pulmonary disease) (HCC)    DEPRESSION    DIABETES MELLITUS, TYPE II    GERD    HYPERLIPIDEMIA    HYPERTENSION    Pneumonia    Psoriasis     Past Surgical History:  Procedure Laterality Date   ABDOMINAL HYSTERECTOMY  1980   CARPAL TUNNEL RELEASE Right    COLONOSCOPY     TRIGGER FINGER RELEASE Left    Index  finger   UPPER GASTROINTESTINAL ENDOSCOPY      Social History   Tobacco Use   Smoking status: Never    Passive exposure: Never   Smokeless tobacco: Never  Vaping Use   Vaping status: Never Used  Substance Use Topics   Alcohol  use: No    Alcohol /week: 0.0 standard drinks of alcohol    Drug use: No    Family History  Problem Relation Age of Onset   Stroke Mother    Angina Mother    Hyperlipidemia Mother    Hypertension Mother    Heart disease Mother    Depression Mother    Diabetes Father    Hyperlipidemia Other        Parent    Hypertension Other        Parent   Diabetes Sister        x1   CAD Sister    COPD Sister        smoker   Hyperlipidemia Sister    Hypertension Sister    Diabetes Brother        x 2   Hyperlipidemia Brother    Hypertension Brother    Diabetes Brother    Hyperlipidemia Brother    Hypertension Brother    CAD Sister    Arthritis Sister    Hyperlipidemia Sister    Hypertension Sister    Colon cancer Neg Hx    Esophageal cancer Neg Hx    Rectal cancer Neg Hx    Stomach cancer Neg Hx    Pancreatic cancer Neg Hx     Allergies  Allergen Reactions   Metformin  And Related Diarrhea   Penicillins Rash    Medication list has been reviewed and updated.  Current Outpatient Medications on File Prior to Visit  Medication Sig Dispense Refill   acetaminophen  (TYLENOL ) 325 MG tablet Take 650 mg by mouth every 6 (six) hours as needed for mild pain or fever.     albuterol  (VENTOLIN  HFA) 108 (90 Base) MCG/ACT inhaler INHALE 1-2 PUFFS INTO THE LUNGS EVERY 6 (SIX) HOURS AS NEEDED FOR WHEEZING OR SHORTNESS OF BREATH. 8.5 each 3   ALPRAZolam  (XANAX ) 0.5 MG tablet TAKE 1 TABLET BY MOUTH TWICE A DAY AS NEEDED FOR ANXIETY (Patient taking differently: Take 0.5 mg by mouth at bedtime as needed for anxiety or sleep.) 60 tablet 1   amLODipine  (NORVASC ) 10 MG tablet TAKE 1 TABLET BY MOUTH EVERY DAY 90 tablet 1   aspirin  81 MG chewable tablet Chew 81 mg by mouth daily.     betamethasone  dipropionate (DIPROLENE ) 0.05 % ointment Apply 1 application  topically 2 (two) times daily.     budesonide -formoterol  (SYMBICORT ) 160-4.5 MCG/ACT inhaler Inhaler 2 puffs twice daily when in your yellow zone. Rinse mouth out after use. 1 each 1   cetirizine  (ZYRTEC ) 10 MG tablet Take 1 tablet (10 mg total) by mouth daily. 30 tablet 11   Cholecalciferol (VITAMIN D3) 50 MCG (2000 UT) capsule Take 2,000 Units by mouth daily.     erythromycin  ophthalmic ointment Place 1 Application into the left eye 2 (two) times daily.      famotidine  (PEPCID ) 40 MG tablet TAKE 1 TABLET BY MOUTH EVERY DAY 90 tablet 0   fluticasone  (CUTIVATE ) 0.05 % cream Apply 1 Application topically 2 (two) times daily.     fluticasone  (FLONASE ) 50 MCG/ACT nasal spray Place 2 sprays into both nostrils 2 (two) times daily. 16 g 6   glimepiride  (AMARYL ) 1 MG tablet Take 1  tablet (1 mg total) by mouth daily with breakfast. 90 tablet 1   glucose blood (ONE TOUCH ULTRA TEST) test strip 1 each by Other route daily. Use 1 strips to check blood sugar twice a day Ex E11.9 100 each 3   HUMIRA  PEN-PS/UV/ADOL HS START 40 MG/0.8ML PNKT Inject 1 Dose into the skin every 14 (fourteen) days. Every 2 weeks     isosorbide  mononitrate (IMDUR ) 30 MG 24 hr tablet Take 1 tablet (30 mg total) by mouth daily. 90 tablet 3   ketoconazole  (NIZORAL ) 2 % cream Apply 1 Application topically daily as needed for irritation.     Lancets (ONETOUCH ULTRASOFT) lancets PRN (Patient taking differently: 1 each by Other route as needed for other (glucose check). PRN) 100 each 3   metoprolol  succinate (TOPROL -XL) 50 MG 24 hr tablet Take 1 tablet (50 mg total) by mouth daily. 90 tablet 2   mometasone (ELOCON) 0.1 % ointment Apply 1 application  topically daily.     montelukast  (SINGULAIR ) 10 MG tablet TAKE 1 TABLET BY MOUTH EVERYDAY AT BEDTIME 90 tablet 1   Multiple Vitamins-Minerals (CENTRUM SILVER PO) Take 1 tablet by mouth every morning.     nitroGLYCERIN  (NITROSTAT ) 0.4 MG SL tablet Place 1 tablet (0.4 mg total) under the tongue every 5 (five) minutes as needed. (Patient taking differently: Place 0.4 mg under the tongue every 5 (five) minutes as needed for chest pain.) 30 tablet 3   pantoprazole  (PROTONIX ) 40 MG tablet TAKE 1 TABLET BY MOUTH TWICE A DAY 180 tablet 2   Potassium Chloride  ER 20 MEQ TBCR TAKE 1 TABLET (20 MEQ TOTAL) BY MOUTH DAILY. TAKE 1 TABLET BY MOUTH EVERY DAY 90 tablet 0   rosuvastatin  (CRESTOR ) 40 MG tablet Take 1 tablet (40 mg total) by mouth daily. 90 tablet 3    triamcinolone  cream (KENALOG ) 0.1 % Apply 1 Application topically 2 (two) times daily as needed (rash).     valACYclovir  (VALTREX ) 500 MG tablet Take 500 mg by mouth 2 (two) times daily.  0   No current facility-administered medications on file prior to visit.    Review of Systems:  As per HPI- otherwise negative.   Physical Examination: There were no vitals filed for this visit. There were no vitals filed for this visit. There is no height or weight on file to calculate BMI. Ideal Body Weight:    GEN: no acute distress. HEENT: Atraumatic, Normocephalic.  Ears and Nose: No external deformity. CV: RRR, No M/G/R. No JVD. No thrill. No extra heart sounds. PULM: CTA B, no wheezes, crackles, rhonchi. No retractions. No resp. distress. No accessory muscle use. ABD: S, NT, ND, +BS. No rebound. No HSM. EXTR: No c/c/e PSYCH: Normally interactive. Conversant.    Assessment and Plan: ***  Signed Harlene Schroeder, MD

## 2024-06-16 ENCOUNTER — Ambulatory Visit (HOSPITAL_BASED_OUTPATIENT_CLINIC_OR_DEPARTMENT_OTHER)
Admission: RE | Admit: 2024-06-16 | Discharge: 2024-06-16 | Disposition: A | Discharge status: Home / Self Care | Source: Ambulatory Visit | Attending: Family Medicine | Admitting: Family Medicine

## 2024-06-16 ENCOUNTER — Encounter: Payer: Self-pay | Admitting: Family Medicine

## 2024-06-16 ENCOUNTER — Ambulatory Visit (INDEPENDENT_AMBULATORY_CARE_PROVIDER_SITE_OTHER): Admitting: Family Medicine

## 2024-06-16 VITALS — BP 108/66 | HR 77 | Temp 99.1°F | Ht 59.0 in | Wt 146.8 lb

## 2024-06-16 DIAGNOSIS — J42 Unspecified chronic bronchitis: Secondary | ICD-10-CM | POA: Diagnosis not present

## 2024-06-16 DIAGNOSIS — R058 Other specified cough: Secondary | ICD-10-CM | POA: Diagnosis not present

## 2024-06-16 DIAGNOSIS — J209 Acute bronchitis, unspecified: Secondary | ICD-10-CM

## 2024-06-16 MED ORDER — PREDNISONE 20 MG PO TABS
ORAL_TABLET | ORAL | 0 refills | Status: DC
Start: 2024-06-16 — End: 2024-07-12

## 2024-06-16 MED ORDER — DOXYCYCLINE HYCLATE 100 MG PO CAPS: 100.0000 mg | ORAL_CAPSULE | Freq: Two times a day (BID) | ORAL | 0 refills | Status: DC

## 2024-06-16 NOTE — Patient Instructions (Signed)
 We will teat you for an exacerbation of your chronic bronchitis with  -doxycycline  antibiotic -prednisone  (steroid) for 3- 6 days  Please let me know if you are not getting back to your baseline I will be in touch with your chest film asap

## 2024-06-18 DIAGNOSIS — L810 Postinflammatory hyperpigmentation: Secondary | ICD-10-CM | POA: Diagnosis not present

## 2024-06-18 DIAGNOSIS — L400 Psoriasis vulgaris: Secondary | ICD-10-CM | POA: Diagnosis not present

## 2024-06-21 ENCOUNTER — Telehealth: Payer: Self-pay | Admitting: Neurosurgery

## 2024-06-21 NOTE — Telephone Encounter (Signed)
 Patient advised.

## 2024-06-21 NOTE — Telephone Encounter (Signed)
 Patient wants to know if Dr.Yarbrough thinks its best to go to a chiropractor or if they could help with the patients diagnosis?   She states she has not heard from pain management, which is currently under review but she wants to know if this is a good idea before she tries it out?

## 2024-06-21 NOTE — Telephone Encounter (Signed)
 Can patient go to chiropractor to see if they can help with patients symptoms of back pain? Pain management office referral is under review with them.

## 2024-06-24 ENCOUNTER — Telehealth: Payer: Self-pay

## 2024-06-24 NOTE — Telephone Encounter (Signed)
 I talked to patient and she has been taking Tylenol  2 tablets 1 time a day, Advil  occasionally 1 tablet maybe every 3-4 days. She is prescribed Methocarbamol  but she has not been taking it. She does have a few Meloxicam  but has not been taking it. She is hesitant to take NSAIDS because she needs to be careful because of the lab result for her kidneys.

## 2024-06-24 NOTE — Telephone Encounter (Signed)
 As of 8.22.25, the referral was documented under review by the other office.  Sent a message to their scheduling team to get an update

## 2024-06-24 NOTE — Telephone Encounter (Signed)
 Spoke with the [facility] PMR facility on [address]- the referral could take at least 2 more weeks for medical review.  Spoke with patient, she would prefer to be seen with a [facility] due to insurance purposes. She is OK with waiting for their office

## 2024-06-24 NOTE — Telephone Encounter (Signed)
 Hello, this pt is wanting to know if there is a medication that she can be prescribed due to having a pins & needles type of feeling in her right side. This issue only happens when she's walking around, not when they are sending. Pt has not heard from pain management and it looks like her referral is being reviewed currently.

## 2024-07-02 ENCOUNTER — Encounter: Payer: Self-pay | Admitting: Physical Medicine & Rehabilitation

## 2024-07-09 ENCOUNTER — Other Ambulatory Visit: Payer: Self-pay | Admitting: Family Medicine

## 2024-07-11 DIAGNOSIS — Z0000 Encounter for general adult medical examination without abnormal findings: Secondary | ICD-10-CM | POA: Insufficient documentation

## 2024-07-11 NOTE — Assessment & Plan Note (Signed)
 Patient encouraged to maintain heart healthy diet, regular exercise, adequate sleep. Consider daily probiotics. Take medications as prescribed. Labs ordered and reviewed. Given and reviewed copy of ACP documents from [facility] Secretary of [place] and encouraged to complete and return  Colonoscopy aged out Pap aged out [facility] 03/2024 repeat every 1-2 years Dexa 2023 repeat every 2-5 years.

## 2024-07-11 NOTE — Assessment & Plan Note (Signed)
 Encourage heart healthy diet such as MIND or DASH diet, increase exercise, avoid trans fats, simple carbohydrates and processed foods, consider a krill or fish or flaxseed oil cap daily. Tolerating Rosuvastatin

## 2024-07-11 NOTE — Assessment & Plan Note (Signed)
 Encouraged DASH or MIND diet, decrease po intake and increase exercise as tolerated. Needs 7-8 hours of sleep nightly. Avoid trans fats, eat small, frequent meals every 4-5 hours with lean proteins, complex carbs and healthy fats. Minimize simple carbs, high fat foods and processed foods

## 2024-07-11 NOTE — Assessment & Plan Note (Signed)
 [id number] acceptable, minimize simple carbs. Increase exercise as tolerated. Continue current meds

## 2024-07-11 NOTE — Assessment & Plan Note (Signed)
 Well controlled, no changes to meds. Encouraged heart healthy diet such as the DASH diet and exercise as tolerated.

## 2024-07-11 NOTE — Assessment & Plan Note (Signed)
 Supplement and monitor

## 2024-07-12 ENCOUNTER — Ambulatory Visit (INDEPENDENT_AMBULATORY_CARE_PROVIDER_SITE_OTHER): Payer: Medicare HMO | Admitting: Family Medicine

## 2024-07-12 ENCOUNTER — Encounter: Payer: Self-pay | Admitting: Family Medicine

## 2024-07-12 VITALS — BP 128/84 | HR 70 | Resp 16 | Ht 59.0 in | Wt 150.4 lb

## 2024-07-12 DIAGNOSIS — E559 Vitamin D deficiency, unspecified: Secondary | ICD-10-CM

## 2024-07-12 DIAGNOSIS — E1169 Type 2 diabetes mellitus with other specified complication: Secondary | ICD-10-CM | POA: Diagnosis not present

## 2024-07-12 DIAGNOSIS — E1165 Type 2 diabetes mellitus with hyperglycemia: Secondary | ICD-10-CM | POA: Diagnosis not present

## 2024-07-12 DIAGNOSIS — Z0000 Encounter for general adult medical examination without abnormal findings: Secondary | ICD-10-CM | POA: Diagnosis not present

## 2024-07-12 DIAGNOSIS — E785 Hyperlipidemia, unspecified: Secondary | ICD-10-CM | POA: Diagnosis not present

## 2024-07-12 DIAGNOSIS — I10 Essential (primary) hypertension: Secondary | ICD-10-CM

## 2024-07-12 MED ORDER — CEFDINIR 300 MG PO CAPS: 300.0000 mg | ORAL_CAPSULE | Freq: Two times a day (BID) | ORAL | 0 refills | Status: AC

## 2024-07-12 MED ORDER — METHYLPREDNISOLONE 4 MG PO TABS: ORAL_TABLET | ORAL | 0 refills | Status: DC
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
## 2024-07-12 NOTE — Patient Instructions (Addendum)
 Add Vitamin D3 2000 international units  daily Prevnar 20 booster Tetanus booster RSV, Respiratory Syncitial Virus Vaccine, Arexvy at pharmacy Prevnar Annual cold and flu shots  [facility] pharmacy does walk in vaccine clinics M-F 9-4  Preventive Care 65 Years and Older, Female Preventive care refers to lifestyle choices and visits with your health care provider that can promote health and wellness. Preventive care visits are also called wellness exams. What can I expect for my preventive care visit? Counseling Your health care provider may ask you questions about your: Medical history, including: Past medical problems. Family medical history. Pregnancy and menstrual history. History of falls. Current health, including: Memory and ability to understand (cognition). Emotional well-being. Home life and relationship well-being. Sexual activity and sexual health. Lifestyle, including: Alcohol , nicotine or tobacco, and drug use. Access to firearms. Diet, exercise, and sleep habits. Work and work Astronomer. Sunscreen use. Safety issues such as seatbelt and bike helmet use. Physical exam Your health care provider will check your: Height and weight. These may be used to calculate your BMI (body mass index). BMI is a measurement that tells if you are at a healthy weight. Waist circumference. This measures the distance around your waistline. This measurement also tells if you are at a healthy weight and may help predict your risk of certain diseases, such as type 2 diabetes and high blood pressure. Heart rate and blood pressure. Body temperature. Skin for abnormal spots. What immunizations do I need?  Vaccines are usually given at various ages, according to a schedule. Your health care provider will recommend vaccines for you based on your age, medical history, and lifestyle or other factors, such as travel or where you work. What tests do I need? Screening Your health care provider  may recommend screening tests for certain conditions. This may include: Lipid and cholesterol levels. Hepatitis C test. Hepatitis B test. HIV (human immunodeficiency virus) test. STI (sexually transmitted infection) testing, if you are at risk. Lung cancer screening. Colorectal cancer screening. Diabetes screening. This is done by checking your blood sugar (glucose) after you have not eaten for a while (fasting). Mammogram. Talk with your health care provider about how often you should have regular mammograms. BRCA-related cancer screening. This may be done if you have a family history of breast, ovarian, tubal, or peritoneal cancers. Bone density scan. This is done to screen for osteoporosis. Talk with your health care provider about your test results, treatment options, and if necessary, the need for more tests. Follow these instructions at home: Eating and drinking  Eat a diet that includes fresh fruits and vegetables, whole grains, lean protein, and low-fat dairy products. Limit your intake of foods with high amounts of sugar, saturated fats, and salt. Take vitamin and mineral supplements as recommended by your health care provider. Do not drink alcohol  if your health care provider tells you not to drink. If you drink alcohol : Limit how much you have to 0-1 drink a day. Know how much alcohol  is in your drink. In the [place] one drink equals one 12 oz bottle of beer (355 mL), one 5 oz glass of wine (148 mL), or one 1 oz glass of hard liquor (44 mL). Lifestyle Brush your teeth every morning and night with fluoride toothpaste. Floss one time each day. Exercise for at least 30 minutes 5 or more days each week. Do not use any products that contain nicotine or tobacco. These products include cigarettes, chewing tobacco, and vaping devices, such as e-cigarettes. If you need  help quitting, ask your health care provider. Do not use drugs. If you are sexually active, practice safe sex. Use a  condom or other form of protection in order to prevent STIs. Take aspirin  only as told by your health care provider. Make sure that you understand how much to take and what form to take. Work with your health care provider to find out whether it is safe and beneficial for you to take aspirin  daily. Ask your health care provider if you need to take a cholesterol-lowering medicine (statin). Find healthy ways to manage stress, such as: Meditation, yoga, or listening to music. Journaling. Talking to a trusted person. Spending time with friends and family. Minimize exposure to UV radiation to reduce your risk of skin cancer. Safety Always wear your seat belt while driving or riding in a vehicle. Do not drive: If you have been drinking alcohol . Do not ride with someone who has been drinking. When you are tired or distracted. While texting. If you have been using any mind-altering substances or drugs. Wear a helmet and other protective equipment during sports activities. If you have firearms in your house, make sure you follow all gun safety procedures. What's next? Visit your health care provider once a year for an annual wellness visit. Ask your health care provider how often you should have your eyes and teeth checked. Stay up to date on all vaccines. This information is not intended to replace advice given to you by your health care provider. Make sure you discuss any questions you have with your health care provider. Document Revised: 04/11/2021 Document Reviewed: 04/11/2021 Elsevier Patient Education  2024 [employer].

## 2024-07-13 ENCOUNTER — Ambulatory Visit: Payer: Self-pay | Admitting: Family Medicine

## 2024-07-13 ENCOUNTER — Encounter: Payer: Self-pay | Admitting: Family Medicine

## 2024-07-13 LAB — CBC WITH DIFFERENTIAL/PLATELET
Basophils Absolute: 0.1 K/uL (ref 0.0–0.1)
Basophils Relative: 0.8 % (ref 0.0–3.0)
Eosinophils Absolute: 0.1 K/uL (ref 0.0–0.7)
Eosinophils Relative: 0.9 % (ref 0.0–5.0)
HCT: 37.9 % (ref 36.0–46.0)
Hemoglobin: 12.4 g/dL (ref 12.0–15.0)
Lymphocytes Relative: 36.9 % (ref 12.0–46.0)
Lymphs Abs: 2.5 K/uL (ref 0.7–4.0)
MCHC: 32.8 g/dL (ref 30.0–36.0)
MCV: 93.4 fl (ref 78.0–100.0)
Monocytes Absolute: 0.7 K/uL (ref 0.1–1.0)
Monocytes Relative: 10.5 % (ref 3.0–12.0)
Neutro Abs: 3.5 K/uL (ref 1.4–7.7)
Neutrophils Relative %: 50.9 % (ref 43.0–77.0)
Platelets: 248 K/uL (ref 150.0–400.0)
RBC: 4.06 Mil/uL (ref 3.87–5.11)
RDW: 14.4 % (ref 11.5–15.5)
WBC: 6.9 K/uL (ref 4.0–10.5)

## 2024-07-13 LAB — COMPREHENSIVE METABOLIC PANEL WITH GFR
ALT: 18 U/L (ref 0–35)
AST: 19 U/L (ref 0–37)
Albumin: 4.1 g/dL (ref 3.5–5.2)
Alkaline Phosphatase: 53 U/L (ref 39–117)
BUN: 17 mg/dL (ref 6–23)
CO2: 26 meq/L (ref 19–32)
Calcium: 9.8 mg/dL (ref 8.4–10.5)
Chloride: 107 meq/L (ref 96–112)
Creatinine, Ser: 1.01 mg/dL (ref 0.40–1.20)
GFR: 53.01 mL/min — ABNORMAL LOW (ref >60.00)
Glucose, Bld: 73 mg/dL (ref 70–99)
Potassium: 4 meq/L (ref 3.5–5.1)
Sodium: 143 meq/L (ref 135–145)
Total Bilirubin: 0.3 mg/dL (ref 0.2–1.2)
Total Protein: 7 g/dL (ref 6.0–8.3)

## 2024-07-13 LAB — LIPID PANEL
Cholesterol: 141 mg/dL (ref 0–200)
HDL: 41.8 mg/dL (ref >39.00)
LDL Cholesterol: 66 mg/dL (ref 0–99)
NonHDL: 99.01
Total CHOL/HDL Ratio: 3
Triglycerides: 165 mg/dL — ABNORMAL HIGH (ref 0.0–149.0)
VLDL: 33 mg/dL (ref 0.0–40.0)

## 2024-07-13 LAB — HEMOGLOBIN A1C: Hgb A1c MFr Bld: 7.2 % — ABNORMAL HIGH (ref 4.6–6.5)

## 2024-07-13 LAB — VITAMIN D 25 HYDROXY (VIT D DEFICIENCY, FRACTURES): VITD: 63.49 ng/mL (ref 30.00–100.00)

## 2024-07-13 LAB — TSH: TSH: 2.34 u[IU]/mL (ref 0.35–5.50)

## 2024-07-14 DIAGNOSIS — H02825 Cysts of left lower eyelid: Secondary | ICD-10-CM | POA: Diagnosis not present

## 2024-07-14 DIAGNOSIS — D23122 Other benign neoplasm of skin of left lower eyelid, including canthus: Secondary | ICD-10-CM | POA: Diagnosis not present

## 2024-07-24 ENCOUNTER — Other Ambulatory Visit: Payer: Self-pay | Admitting: Family Medicine

## 2024-08-06 ENCOUNTER — Encounter: Payer: Self-pay | Admitting: Physical Medicine & Rehabilitation

## 2024-08-06 ENCOUNTER — Encounter: Attending: Physical Medicine & Rehabilitation | Admitting: Physical Medicine & Rehabilitation

## 2024-08-06 VITALS — BP 111/63 | HR 83 | Ht 59.0 in | Wt 150.0 lb

## 2024-08-06 DIAGNOSIS — Z79899 Other long term (current) drug therapy: Secondary | ICD-10-CM | POA: Diagnosis not present

## 2024-08-06 DIAGNOSIS — Z5181 Encounter for therapeutic drug level monitoring: Secondary | ICD-10-CM | POA: Insufficient documentation

## 2024-08-06 DIAGNOSIS — G894 Chronic pain syndrome: Secondary | ICD-10-CM | POA: Diagnosis not present

## 2024-08-06 DIAGNOSIS — G8929 Other chronic pain: Secondary | ICD-10-CM | POA: Diagnosis not present

## 2024-08-06 MED ORDER — DULOXETINE HCL 30 MG PO CPEP: 30.0000 mg | ORAL_CAPSULE | Freq: Every day | ORAL | 3 refills | Status: DC
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
## 2024-08-10 ENCOUNTER — Encounter: Payer: Self-pay | Admitting: Family Medicine

## 2024-08-11 LAB — DRUG TOX MONITOR 1 W/CONF, ORAL FLD
AMINOCLONAZEPAM: NEGATIVE ng/mL (ref <0.50)
Alprazolam: 0.64 ng/mL — ABNORMAL HIGH (ref <0.50)
Amphetamines: NEGATIVE ng/mL (ref <10)
Barbiturates: NEGATIVE ng/mL (ref <10)
Benzodiazepines: POSITIVE ng/mL — AB (ref <0.50)
Buprenorphine: NEGATIVE ng/mL (ref <0.10)
Chlordiazepoxide: NEGATIVE ng/mL (ref <0.50)
Clonazepam: NEGATIVE ng/mL (ref <0.50)
Cocaine: NEGATIVE ng/mL (ref <5.0)
Diazepam: NEGATIVE ng/mL (ref <0.50)
Fentanyl: NEGATIVE ng/mL (ref <0.10)
Flunitrazepam: NEGATIVE ng/mL (ref <0.50)
Flurazepam: NEGATIVE ng/mL (ref <0.50)
Heroin Metabolite: NEGATIVE ng/mL (ref <1.0)
Lorazepam: NEGATIVE ng/mL (ref <0.50)
MARIJUANA: NEGATIVE ng/mL (ref <2.5)
MDMA: NEGATIVE ng/mL (ref <10)
Meprobamate: NEGATIVE ng/mL (ref <2.5)
Methadone: NEGATIVE ng/mL (ref <5.0)
Midazolam: NEGATIVE ng/mL (ref <0.50)
Nicotine Metabolite: NEGATIVE ng/mL (ref <5.0)
Nordiazepam: NEGATIVE ng/mL (ref <0.50)
Opiates: NEGATIVE ng/mL (ref <2.5)
Oxazepam: NEGATIVE ng/mL (ref <0.50)
Phencyclidine: NEGATIVE ng/mL (ref <10)
Tapentadol: NEGATIVE ng/mL (ref <5.0)
Temazepam: NEGATIVE ng/mL (ref <0.50)
Tramadol: NEGATIVE ng/mL (ref <5.0)
Triazolam: NEGATIVE ng/mL (ref <0.50)
Zolpidem: NEGATIVE ng/mL (ref <5.0)

## 2024-08-11 LAB — DRUG TOX ALC METAB W/CON, ORAL FLD: Alcohol Metabolite: NEGATIVE ng/mL (ref <25)

## 2024-08-14 ENCOUNTER — Other Ambulatory Visit: Payer: Self-pay | Admitting: Family Medicine

## 2024-08-17 ENCOUNTER — Telehealth: Payer: Self-pay

## 2024-08-17 NOTE — Telephone Encounter (Signed)
 Heidi Franklin has some concerns she would like to discuss with you. Patient is worried about taking Xanax  with Duloxetine. She stated there were several side effects.   Call back phone [phone].

## 2024-08-18 ENCOUNTER — Encounter: Payer: Self-pay | Admitting: Family Medicine

## 2024-08-30 ENCOUNTER — Encounter: Payer: Self-pay | Admitting: Radiology

## 2024-08-31 ENCOUNTER — Other Ambulatory Visit: Payer: Self-pay | Admitting: Physical Medicine & Rehabilitation

## 2024-09-04 ENCOUNTER — Other Ambulatory Visit: Payer: Self-pay | Admitting: Cardiology

## 2024-09-04 ENCOUNTER — Other Ambulatory Visit: Payer: Self-pay | Admitting: Family Medicine

## 2024-09-06 ENCOUNTER — Encounter: Payer: Self-pay | Admitting: *Deleted

## 2024-09-09 ENCOUNTER — Encounter: Payer: Self-pay | Admitting: Podiatry

## 2024-09-09 ENCOUNTER — Ambulatory Visit: Admitting: Podiatry

## 2024-09-09 DIAGNOSIS — M79675 Pain in left toe(s): Secondary | ICD-10-CM | POA: Diagnosis not present

## 2024-09-09 DIAGNOSIS — Q828 Other specified congenital malformations of skin: Secondary | ICD-10-CM

## 2024-09-09 DIAGNOSIS — B351 Tinea unguium: Secondary | ICD-10-CM

## 2024-09-09 DIAGNOSIS — Z0189 Encounter for other specified special examinations: Secondary | ICD-10-CM | POA: Diagnosis not present

## 2024-09-09 DIAGNOSIS — E119 Type 2 diabetes mellitus without complications: Secondary | ICD-10-CM | POA: Diagnosis not present

## 2024-09-09 DIAGNOSIS — M79674 Pain in right toe(s): Secondary | ICD-10-CM | POA: Diagnosis not present

## 2024-09-09 NOTE — Progress Notes (Signed)
  Subjective:  Patient ID: Heidi Franklin, female    DOB: 06-Jun-1946,   MRN: [id number]  Chief Complaint  Patient presents with   Diabetes    I'm supposed to have my Diabetic foot exam and cut my toenails.  Saw Dr. Harlene Horton - 07/12/2024; A1c - 7.2    78 y.o. female presents for concern of thickened elongated and painful nails that are difficult to trim. Relates a callus as well that has developed.  Requesting to have them trimmed today. Relates burning and tingling in their feet. Patient is diabetic and last A1c was  Lab Results  Component Value Date   HGBA1C 7.2 (H) 07/12/2024   .     PCP:  Horton Harlene LABOR, MD    . Denies any other pedal complaints. Denies n/v/f/c.   Past Medical History:  Diagnosis Date   ALLERGIC RHINITIS    Allergy     Anxiety state, unspecified    Arthritis    Asthma    BRONCHITIS, CHRONIC    Cataract    b/l   Chronic low back pain 10/09/2016   COPD (chronic obstructive pulmonary disease) (HCC)    DEPRESSION    DIABETES MELLITUS, TYPE II    GERD    HYPERLIPIDEMIA    HYPERTENSION    Pneumonia    Psoriasis     Objective:  Physical Exam: Vascular: DP/PT pulses 2/4 bilateral. CFT <3 seconds. Absent hair growth on digits. Edema noted to bilateral lower extremities. Xerosis noted bilaterally.  Skin. No lacerations or abrasions bilateral feet. Nails 1-5 bilateral  are thickened discolored and elongated with subungual debris. Hyperkeratotic cored lesion noted to plantar fifth metatarsal head.   Musculoskeletal: MMT 5/5 bilateral lower extremities in DF, PF, Inversion and Eversion. Deceased ROM in DF of ankle joint. Mild tender to insertion of achilles tendon on the right.  Neurological: Sensation intact to light touch. Protective sensation diminished bilateral.    Assessment:   1. Pain due to onychomycosis of toenails of both feet   2. Diabetes mellitus without complication (HCC)   3. Porokeratosis   4. Encounter for diabetic foot exam (HCC)         Plan:  Patient was evaluated and treated and all questions answered. -Discussed and educated patient on diabetic foot care, especially with  regards to the vascular, neurological and musculoskeletal systems.  -Stressed the importance of good glycemic control and the detriment of not  controlling glucose levels in relation to the foot. -Discussed supportive shoes at all times and checking feet regularly.  -Mechanically debrided all nails 1-5 bilateral using sterile nail nipper and filed with dremel without incident  -Hyperkeratotic tissue debrided without incident with chisel to fifth metatarsal head plantar as courtesy. -Discussed callus care and offloading of the area to prevent any ulceration.  No current ulcerations noted today. -Answered all patient questions -Patient to return  in 3 months for at risk foot care -Patient advised to call the office if any problems or questions arise in the meantime.   Asberry Failing, DPM

## 2024-09-10 ENCOUNTER — Other Ambulatory Visit: Payer: Self-pay | Admitting: Cardiology

## 2024-09-13 ENCOUNTER — Encounter: Payer: Self-pay | Admitting: Family Medicine

## 2024-09-13 ENCOUNTER — Encounter: Admitting: Physical Medicine & Rehabilitation

## 2024-09-15 ENCOUNTER — Ambulatory Visit: Admitting: Family Medicine

## 2024-09-15 ENCOUNTER — Encounter: Payer: Self-pay | Admitting: Family Medicine

## 2024-09-15 ENCOUNTER — Ambulatory Visit: Payer: Self-pay

## 2024-09-15 ENCOUNTER — Other Ambulatory Visit: Payer: Self-pay | Admitting: Family Medicine

## 2024-09-15 VITALS — BP 128/66 | HR 93 | Temp 99.4°F | Ht 59.0 in | Wt 149.8 lb

## 2024-09-15 DIAGNOSIS — R053 Chronic cough: Secondary | ICD-10-CM

## 2024-09-15 MED ORDER — PREDNISONE 20 MG PO TABS
ORAL_TABLET | ORAL | 0 refills | Status: AC
Start: 2024-09-15 — End: ?

## 2024-09-15 MED ORDER — PREDNISONE 20 MG PO TABS: ORAL_TABLET | ORAL | 0 refills | Status: DC

## 2024-09-15 MED ORDER — BUDESONIDE-FORMOTEROL FUMARATE 160-4.5 MCG/ACT IN AERO: INHALATION_SPRAY | RESPIRATORY_TRACT | 3 refills | Status: AC

## 2024-09-15 MED ORDER — DOXYCYCLINE HYCLATE 100 MG PO CAPS: 100.0000 mg | ORAL_CAPSULE | Freq: Two times a day (BID) | ORAL | 0 refills | Status: AC

## 2024-09-15 NOTE — Patient Instructions (Addendum)
 Good to see you today- I hope you are feeling better soon!  You might try the symbicort  inhaler again and see if you think it is helping you  Remember the prednisone  may raise your blood sugars- watch your diet while you are taking it

## 2024-09-15 NOTE — Progress Notes (Signed)
 [facility] [address] [place], [place] [address] [phone]  Date:  09/15/2024   Name:  Heidi Franklin   DOB:  1946/10/09   MRN:  [id number]  PCP:  Domenica Harlene LABOR, MD    Chief Complaint: Cough (Chronic cough it comes and goes, this time last week it seemed to be a little worse Ive had this cough for years )   History of Present Illness:  Heidi Franklin is a 78 y.o. very pleasant female patient who presents with the following:  Patient seen today with concern of a cough.  She is a primary patient of my partner Dr. Domenica I did see her myself over the summer, in August with exacerbation of chronic bronchitis I treated her with doxycycline  and prednisone  at that time Chest x-ray dated 06/16/2024 was normal  Lab Results  Component Value Date   HGBA1C 7.2 (H) 07/12/2024   Discussed the use of AI scribe software for clinical note transcription with the patient, who gave verbal consent to proceed.  History of Present Illness Heidi Franklin is a 78 year old female with chronic bronchitis who presents with a persistent cough.  She has been experiencing a cough for about a week, characterized by spurts or 'attacks'. The cough became more chronic last night. There is no sputum production.  She feels generally unwell, with symptoms of achiness and tiredness. She has a low-grade fever, with her temperature around 61F. She had a sore throat about three weeks ago, which progressed into a cold, but she believes she recovered from that.  She has a history of chronic bronchitis and has previously used various inhalers, including albuterol , which she is currently using. She has not used Symbicort  in a long time and is unsure of its efficacy. She has seen a pulmonologist in the past but does not recall the details of the visit.  She denies a history of smoking and mentions that her medical records listed COPD. She has also been  to an ear, nose, and throat specialist for testing.  For allergies, she is currently taking Zyrtec .    Patient Active Problem List   Diagnosis Date Noted   Preventative health care 07/11/2024   Hypotension 04/27/2024   Coronary artery disease no hemodynamically significant stenosis based on coronary CT angio from March 2023 10/01/2022   Positive FIT (fecal immunochemical test) 09/25/2022   ALLERGIC RHINITIS 11/15/2021   Arthritis 11/15/2021   Asthma 11/15/2021   BRONCHITIS, CHRONIC 11/15/2021   Change in bowel habits 10/18/2021   Type 2 diabetes mellitus with hyperglycemia (HCC) 09/24/2021   Acute bilateral knee pain 09/24/2021   Vitiligo 09/07/2021   Pedal edema 03/27/2020   Body mass index (BMI) 31.0-31.9, adult 11/08/2019   Disc displacement, lumbar 11/08/2019   Strain of lumbar paraspinal muscle 11/08/2019   History of chicken pox 04/11/2019   Vitamin D  deficiency 04/05/2019   Lumbar radiculopathy 03/31/2019   Neck pain 07/06/2018   Trapezius muscle strain, right, initial encounter 06/23/2018   Edema 06/23/2018   Gout 04/08/2018   Herpes simplex 02/10/2018   ETD (eustachian tube dysfunction) 02/10/2018   Peroneal tendinitis, right 01/06/2018   Psoriasis 11/14/2017   Acute bursitis of right shoulder 09/29/2017   Bilateral hip pain 07/10/2017   Bronchiectasis without complication (HCC) 06/25/2017   Irritable larynx syndrome 04/25/2017   Reflux laryngitis 04/23/2017   Benign lipomatous neoplasm of skin and subcutaneous tissue of right  leg 02/25/2017   Anemia 02/05/2017   Fatigue 01/23/2017   Cough 01/02/2017   Multiple pulmonary nodules 11/10/2016   Chronic low back pain 10/09/2016   Degenerative arthritis of right knee 06/20/2016   Degenerative arthritis of left knee 04/10/2016   Peroneal tendinitis of left lower extremity 09/05/2015   Recurrent falls 08/08/2015   Bursitis of left shoulder 07/13/2015   Morbid obesity due to excess calories (HCC) 04/16/2015    Atypical chest pain 05/11/2011   Nonspecific abnormal electrocardiogram (ECG) (EKG) 05/11/2011   Hyperlipidemia associated with type 2 diabetes mellitus (HCC) 03/02/2010   Depression with anxiety 03/02/2010   Essential hypertension 03/02/2010   Chronic rhinitis 03/02/2010   Upper airway cough syndrome 03/02/2010   GERD 03/02/2010    Past Medical History:  Diagnosis Date   ALLERGIC RHINITIS    Allergy     Anxiety state, unspecified    Arthritis    Asthma    BRONCHITIS, CHRONIC    Cataract    b/l   Chronic low back pain 10/09/2016   COPD (chronic obstructive pulmonary disease) (HCC)    DEPRESSION    DIABETES MELLITUS, TYPE II    GERD    HYPERLIPIDEMIA    HYPERTENSION    Pneumonia    Psoriasis     Past Surgical History:  Procedure Laterality Date   ABDOMINAL HYSTERECTOMY  1980   CARPAL TUNNEL RELEASE Right    COLONOSCOPY     TRIGGER FINGER RELEASE Left    Index finger   UPPER GASTROINTESTINAL ENDOSCOPY      Social History   Tobacco Use   Smoking status: Never    Passive exposure: Never   Smokeless tobacco: Never  Vaping Use   Vaping status: Never Used  Substance Use Topics   Alcohol  use: No    Alcohol /week: 0.0 standard drinks of alcohol    Drug use: No    Family History  Problem Relation Age of Onset   Stroke Mother    Angina Mother    Hyperlipidemia Mother    Hypertension Mother    Heart disease Mother    Depression Mother    Diabetes Father    Hyperlipidemia Other        Parent   Hypertension Other        Parent   Diabetes Sister        x1   CAD Sister    COPD Sister        smoker   Hyperlipidemia Sister    Hypertension Sister    Diabetes Brother        x 2   Hyperlipidemia Brother    Hypertension Brother    Diabetes Brother    Hyperlipidemia Brother    Hypertension Brother    CAD Sister    Arthritis Sister    Hyperlipidemia Sister    Hypertension Sister    Colon cancer Neg Hx    Esophageal cancer Neg Hx    Rectal cancer Neg Hx     Stomach cancer Neg Hx    Pancreatic cancer Neg Hx     Allergies  Allergen Reactions   Gabapentin  Other (See Comments)    Bothered her nerves   Metformin  And Related Diarrhea   Penicillins Rash    Medication list has been reviewed and updated.  Current Outpatient Medications on File Prior to Visit  Medication Sig Dispense Refill   acetaminophen  (TYLENOL ) 325 MG tablet Take 650 mg by mouth every 6 (six) hours as needed for mild pain or  fever.     albuterol  (VENTOLIN  HFA) 108 (90 Base) MCG/ACT inhaler INHALE 1-2 PUFFS INTO THE LUNGS EVERY 6 (SIX) HOURS AS NEEDED FOR WHEEZING OR SHORTNESS OF BREATH. 8.5 each 3   ALPRAZolam  (XANAX ) 0.5 MG tablet TAKE 1 TABLET BY MOUTH TWICE A DAY AS NEEDED FOR ANXIETY (Patient taking differently: Take 0.5 mg by mouth at bedtime as needed for anxiety or sleep.) 60 tablet 1   amLODipine  (NORVASC ) 10 MG tablet TAKE 1 TABLET BY MOUTH EVERY DAY 90 tablet 1   aspirin  81 MG chewable tablet Chew 81 mg by mouth daily.     betamethasone  dipropionate (DIPROLENE ) 0.05 % ointment Apply 1 application  topically 2 (two) times daily.     cetirizine  (ZYRTEC ) 10 MG tablet Take 1 tablet (10 mg total) by mouth daily. 30 tablet 11   Cholecalciferol (VITAMIN D3) 50 MCG (2000 UT) capsule Take 2,000 Units by mouth daily.     DULoxetine (CYMBALTA) 30 MG capsule TAKE 1-2 CAPSULES (30-60 MG TOTAL) BY MOUTH DAILY. START WITH 1 CAP DAILY, IF TOLERATING AFTER 3 WEEKS CAN INCREASE TO 2 CAPSULES DAILY 180 capsule 2   erythromycin  ophthalmic ointment Place 1 Application into the left eye 2 (two) times daily.     famotidine  (PEPCID ) 40 MG tablet Take 1 tablet (40 mg total) by mouth daily. 90 tablet 1   fluticasone  (CUTIVATE ) 0.05 % cream Apply 1 Application topically 2 (two) times daily.     fluticasone  (FLONASE ) 50 MCG/ACT nasal spray Place 2 sprays into both nostrils 2 (two) times daily. 16 g 6   glimepiride  (AMARYL ) 1 MG tablet Take 1 tablet (1 mg total) by mouth daily with breakfast. 90  tablet 1   glucose blood (ONE TOUCH ULTRA TEST) test strip 1 each by Other route daily. Use 1 strips to check blood sugar twice a day Ex E11.9 100 each 3   HUMIRA  PEN-PS/UV/ADOL HS START 40 MG/0.8ML PNKT Inject 1 Dose into the skin every 14 (fourteen) days. Every 2 weeks     isosorbide  mononitrate (IMDUR ) 30 MG 24 hr tablet TAKE 1 TABLET BY MOUTH EVERY DAY 90 tablet 0   ketoconazole  (NIZORAL ) 2 % cream Apply 1 Application topically daily as needed for irritation.     Lancets (ONETOUCH ULTRASOFT) lancets PRN (Patient taking differently: 1 each by Other route as needed for other (glucose check). PRN) 100 each 3   metoprolol  succinate (TOPROL -XL) 50 MG 24 hr tablet TAKE 1 TABLET BY MOUTH EVERY DAY 90 tablet 0   mometasone (ELOCON) 0.1 % ointment Apply 1 application  topically daily.     montelukast  (SINGULAIR ) 10 MG tablet Take 1 tablet (10 mg total) by mouth at bedtime. 90 tablet 0   Multiple Vitamins-Minerals (CENTRUM SILVER PO) Take 1 tablet by mouth every morning.     nitroGLYCERIN  (NITROSTAT ) 0.4 MG SL tablet Place 1 tablet (0.4 mg total) under the tongue every 5 (five) minutes as needed. 30 tablet 3   pantoprazole  (PROTONIX ) 40 MG tablet TAKE 1 TABLET BY MOUTH TWICE A DAY 180 tablet 2   Potassium Chloride  ER 20 MEQ TBCR TAKE 1 TABLET (20 MEQ TOTAL) BY MOUTH DAILY. TAKE 1 TABLET BY MOUTH EVERY DAY 90 tablet 1   rosuvastatin  (CRESTOR ) 40 MG tablet Take 1 tablet (40 mg total) by mouth daily. 90 tablet 3   triamcinolone  cream (KENALOG ) 0.1 % Apply 1 Application topically 2 (two) times daily as needed (rash).     valACYclovir  (VALTREX ) 500 MG tablet Take 500  mg by mouth 2 (two) times daily.  0   No current facility-administered medications on file prior to visit.    Review of Systems:  As per HPI- otherwise negative.   Physical Examination: Vitals:   09/15/24 1344  BP: 128/66  Pulse: 93  Temp: 99.4 F (37.4 C)  SpO2: 99%   Vitals:   09/15/24 1344  Weight: 149 lb 12.8 oz (67.9 kg)   Height: 4' 11 (1.499 m)   Body mass index is 30.26 kg/m. Ideal Body Weight: Weight in (lb) to have BMI = 25: 123.5  GEN: no acute distress.  Mild obesity, looks well  HEENT: Atraumatic, Normocephalic.  Oropharynx inflamed but no exudate  Ears and Nose: No external deformity. CV: RRR, No M/G/R. No JVD. No thrill. No extra heart sounds. PULM: CTA B, no wheezes, crackles, rhonchi. No retractions. No resp. distress. No accessory muscle use. EXTR: No c/c/e PSYCH: Normally interactive. Conversant.    Assessment and Plan: Chronic cough - Plan: budesonide -formoterol  (SYMBICORT ) 160-4.5 MCG/ACT inhaler, doxycycline  (VIBRAMYCIN ) 100 MG capsule, predniSONE  (DELTASONE ) 20 MG tablet, DISCONTINUED: predniSONE  (DELTASONE ) 20 MG tablet  Assessment & Plan Chronic bronchitis with acute exacerbation Cough for a week, low-grade fever, no significant sputum. No recent Symbicort  use, albuterol  used. Past benefit from steroids. Differential includes unknown cough etiology. - Prescribed steroids. Pred 20 by mouth for 3-5 days with respect to diabetes.  Asked her to watch her diet while taking  - Prescribed doxycycline . - Refilled Symbicort  inhaler for trial use.  Allergic rhinitis Throat redness likely due to allergies. Currently on Zyrtec . - Continue Zyrtec  for allergy  management.  Signed Harlene Schroeder, MD

## 2024-09-15 NOTE — Telephone Encounter (Signed)
 FYI Only or Action Required?: FYI only for provider: appointment scheduled on 09/15/24.  Patient was last seen in primary care on 07/12/2024 by Domenica Harlene LABOR, MD.  Called Nurse Triage reporting Cough.  Symptoms began a week ago.  Interventions attempted: OTC medications: tussin, promethazine .  Symptoms are: gradually worsening.  Triage Disposition: See Physician Within 24 Hours  Patient/caregiver understands and will follow disposition?: Yes Reason for Disposition  [1] Continuous (nonstop) coughing interferes with work or school AND [2] no improvement using cough treatment per Care Advice  Answer Assessment - Initial Assessment Questions Pt reports poss flare up of chronic bronchitis. Was taking tussin and promethazine .  1. ONSET: When did the cough begin?      Chronic x years, intermittent. This episode x 1 week, worse overnight.   2. SEVERITY: How bad is the cough today?      Severe when waking up  3. SPUTUM: Describe the color of your sputum (e.g., none, dry cough; clear, white, yellow, green)     Clear  4. HEMOPTYSIS: Are you coughing up any blood? If so ask: How much? (e.g., flecks, streaks, tablespoons, etc.)     Denies  5. DIFFICULTY BREATHING: Are you having difficulty breathing? If Yes, ask: How bad is it? (e.g., mild, moderate, severe)      Denies  6. FEVER: Do you have a fever? If Yes, ask: What is your temperature, how was it measured, and when did it start?     99  7. CARDIAC HISTORY: Do you have any history of heart disease? (e.g., heart attack, congestive heart failure)      HTN, sees cardio but states no issues  8. LUNG HISTORY: Do you have any history of lung disease?  (e.g., pulmonary embolus, asthma, emphysema)     Chronic bronchitis, asthma, COPD  Protocols used: Cough - Chronic-A-AH Copied from CRM #[id number]. Topic: Clinical - Red Word Triage >> Sep 15, 2024  8:10 AM Suzen RAMAN wrote: Red Word that prompted transfer to Nurse  Triage: productive cough with mucous seeking recommendations on what to do  Past Medical History:  Diagnosis Date   ALLERGIC RHINITIS    Allergy     Anxiety state, unspecified    Arthritis    Asthma    BRONCHITIS, CHRONIC    Cataract    b/l   Chronic low back pain 10/09/2016   COPD (chronic obstructive pulmonary disease) (HCC)    DEPRESSION    DIABETES MELLITUS, TYPE II    GERD    HYPERLIPIDEMIA    HYPERTENSION    Pneumonia    Psoriasis

## 2024-09-15 NOTE — Telephone Encounter (Signed)
 Copied from CRM [id number]. Topic: Clinical - Prescription Issue >> Sep 15, 2024  3:54 PM Eva FALCON wrote: Reason for CRM: Pt was calling in and states she is at the pharmacy [facility] on [address], they told her they have not received prescriptions for Symbicort , Doxycyline, or Prednisone . I do see where it was sent today, unsure if maybe a fax error? Could these be resent? [facility] on [address]

## 2024-09-16 ENCOUNTER — Telehealth: Payer: Self-pay | Admitting: Physical Medicine & Rehabilitation

## 2024-09-16 NOTE — Telephone Encounter (Signed)
Pt seen by Dr. Copland yesterday.  

## 2024-09-16 NOTE — Telephone Encounter (Signed)
 Patient was told that she shouldn't have stopped taking Cymbalta--she stopped taking it after she had a dream and knocked everything off nightstand and had only took it for 2 weeks.

## 2024-09-26 ENCOUNTER — Encounter: Payer: Self-pay | Admitting: Family Medicine

## 2024-09-27 MED ORDER — ACCU-CHEK GUIDE TEST VI STRP: ORAL_STRIP | 12 refills | Status: AC

## 2024-09-27 MED ORDER — ACCU-CHEK GUIDE W/DEVICE KIT: PACK | 0 refills | Status: AC

## 2024-09-27 MED ORDER — ACCU-CHEK SOFTCLIX LANCETS MISC: 12 refills | Status: AC

## 2024-10-01 ENCOUNTER — Other Ambulatory Visit: Payer: Self-pay | Admitting: Family Medicine

## 2024-10-01 ENCOUNTER — Encounter: Admitting: Physical Medicine & Rehabilitation

## 2024-10-05 DIAGNOSIS — H52223 Regular astigmatism, bilateral: Secondary | ICD-10-CM | POA: Diagnosis not present

## 2024-10-05 DIAGNOSIS — H524 Presbyopia: Secondary | ICD-10-CM | POA: Diagnosis not present

## 2024-10-05 DIAGNOSIS — E113293 Type 2 diabetes mellitus with mild nonproliferative diabetic retinopathy without macular edema, bilateral: Secondary | ICD-10-CM | POA: Diagnosis not present

## 2024-10-05 DIAGNOSIS — D23122 Other benign neoplasm of skin of left lower eyelid, including canthus: Secondary | ICD-10-CM | POA: Diagnosis not present

## 2024-10-05 DIAGNOSIS — H25813 Combined forms of age-related cataract, bilateral: Secondary | ICD-10-CM | POA: Diagnosis not present

## 2024-10-05 DIAGNOSIS — H43813 Vitreous degeneration, bilateral: Secondary | ICD-10-CM | POA: Diagnosis not present

## 2024-10-05 LAB — OPHTHALMOLOGY REPORT-SCANNED

## 2024-10-05 MED ORDER — TRAMADOL HCL 50 MG PO TABS: 50.0000 mg | ORAL_TABLET | Freq: Two times a day (BID) | ORAL | 0 refills | Status: DC | PRN

## 2024-10-05 NOTE — Addendum Note (Signed)
 Addended by: URBANO ALBRIGHT on: 10/05/2024 01:59 PM   Modules accepted: Orders

## 2024-10-06 ENCOUNTER — Other Ambulatory Visit: Payer: Self-pay | Admitting: Family Medicine

## 2024-10-12 DIAGNOSIS — L510 Nonbullous erythema multiforme: Secondary | ICD-10-CM | POA: Diagnosis not present

## 2024-10-12 DIAGNOSIS — B001 Herpesviral vesicular dermatitis: Secondary | ICD-10-CM | POA: Diagnosis not present

## 2024-10-23 ENCOUNTER — Other Ambulatory Visit: Payer: Self-pay | Admitting: Cardiology

## 2024-11-12 ENCOUNTER — Encounter: Payer: Self-pay | Admitting: Physical Medicine & Rehabilitation

## 2024-11-12 ENCOUNTER — Encounter: Attending: Physical Medicine & Rehabilitation | Admitting: Physical Medicine & Rehabilitation

## 2024-11-12 VITALS — BP 124/74 | HR 87 | Ht 59.0 in | Wt 143.0 lb

## 2024-11-12 DIAGNOSIS — G894 Chronic pain syndrome: Secondary | ICD-10-CM | POA: Insufficient documentation

## 2024-11-12 DIAGNOSIS — G8929 Other chronic pain: Secondary | ICD-10-CM | POA: Insufficient documentation

## 2024-11-12 DIAGNOSIS — M545 Low back pain, unspecified: Secondary | ICD-10-CM | POA: Insufficient documentation

## 2024-11-12 DIAGNOSIS — Z79899 Other long term (current) drug therapy: Secondary | ICD-10-CM | POA: Insufficient documentation

## 2024-11-12 MED ORDER — HYDROCODONE-ACETAMINOPHEN 5-325 MG PO TABS: 1.0000 | ORAL_TABLET | Freq: Two times a day (BID) | ORAL | 0 refills | Status: AC | PRN
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
## 2024-11-17 ENCOUNTER — Other Ambulatory Visit: Payer: Self-pay | Admitting: Cardiology

## 2024-11-18 NOTE — Telephone Encounter (Signed)
 Patient needs appointment for refills

## 2024-12-07 ENCOUNTER — Encounter: Attending: Physical Medicine & Rehabilitation | Admitting: Physical Medicine & Rehabilitation

## 2025-05-10 ENCOUNTER — Ambulatory Visit
